# Patient Record
Sex: Male | Born: 1955
Health system: Southern US, Community
[De-identification: ages and names within clinical notes are randomized; demographics above are authoritative.]

## PROBLEM LIST (undated history)

## (undated) DIAGNOSIS — Z972 Presence of dental prosthetic device (complete) (partial): Secondary | ICD-10-CM

## (undated) DIAGNOSIS — Z8719 Personal history of other diseases of the digestive system: Secondary | ICD-10-CM

## (undated) DIAGNOSIS — F32A Depression, unspecified: Secondary | ICD-10-CM

## (undated) DIAGNOSIS — Z8711 Personal history of peptic ulcer disease: Secondary | ICD-10-CM

## (undated) DIAGNOSIS — K219 Gastro-esophageal reflux disease without esophagitis: Secondary | ICD-10-CM

## (undated) DIAGNOSIS — T148XXA Other injury of unspecified body region, initial encounter: Secondary | ICD-10-CM

## (undated) DIAGNOSIS — F329 Major depressive disorder, single episode, unspecified: Secondary | ICD-10-CM

## (undated) DIAGNOSIS — M1990 Unspecified osteoarthritis, unspecified site: Secondary | ICD-10-CM

## (undated) DIAGNOSIS — Z973 Presence of spectacles and contact lenses: Secondary | ICD-10-CM

## (undated) DIAGNOSIS — Z969 Presence of functional implant, unspecified: Secondary | ICD-10-CM

## (undated) DIAGNOSIS — F419 Anxiety disorder, unspecified: Secondary | ICD-10-CM

## (undated) DIAGNOSIS — R51 Headache: Secondary | ICD-10-CM

## (undated) DIAGNOSIS — Z87442 Personal history of urinary calculi: Secondary | ICD-10-CM

## (undated) DIAGNOSIS — K760 Fatty (change of) liver, not elsewhere classified: Secondary | ICD-10-CM

## (undated) HISTORY — PX: INGUINAL HERNIA REPAIR: SUR1180

## (undated) HISTORY — PX: ESOPHAGOGASTRODUODENOSCOPY: SHX1529

---

## 2001-04-16 ENCOUNTER — Emergency Department (HOSPITAL_COMMUNITY): Admission: EM | Admit: 2001-04-16 | Discharge: 2001-04-16 | Payer: Self-pay | Admitting: Emergency Medicine

## 2001-04-16 ENCOUNTER — Encounter: Payer: Self-pay | Admitting: Emergency Medicine

## 2001-05-16 ENCOUNTER — Encounter: Payer: Self-pay | Admitting: Urology

## 2001-05-16 ENCOUNTER — Ambulatory Visit (HOSPITAL_COMMUNITY): Admission: RE | Admit: 2001-05-16 | Discharge: 2001-05-16 | Payer: Self-pay | Admitting: Urology

## 2001-06-03 ENCOUNTER — Encounter: Payer: Self-pay | Admitting: Urology

## 2001-06-03 ENCOUNTER — Ambulatory Visit (HOSPITAL_COMMUNITY): Admission: RE | Admit: 2001-06-03 | Discharge: 2001-06-03 | Payer: Self-pay | Admitting: Urology

## 2001-08-13 ENCOUNTER — Inpatient Hospital Stay (HOSPITAL_COMMUNITY): Admission: EM | Admit: 2001-08-13 | Discharge: 2001-08-15 | Payer: Self-pay | Admitting: *Deleted

## 2001-08-13 ENCOUNTER — Encounter: Payer: Self-pay | Admitting: *Deleted

## 2001-08-14 ENCOUNTER — Encounter: Payer: Self-pay | Admitting: Urology

## 2001-08-22 ENCOUNTER — Emergency Department (HOSPITAL_COMMUNITY): Admission: EM | Admit: 2001-08-22 | Discharge: 2001-08-22 | Payer: Self-pay | Admitting: *Deleted

## 2001-08-22 ENCOUNTER — Encounter: Payer: Self-pay | Admitting: *Deleted

## 2002-06-12 ENCOUNTER — Encounter (INDEPENDENT_AMBULATORY_CARE_PROVIDER_SITE_OTHER): Payer: Self-pay | Admitting: Internal Medicine

## 2002-06-12 ENCOUNTER — Ambulatory Visit (HOSPITAL_COMMUNITY): Admission: RE | Admit: 2002-06-12 | Discharge: 2002-06-12 | Payer: Self-pay | Admitting: Internal Medicine

## 2002-06-22 ENCOUNTER — Encounter (INDEPENDENT_AMBULATORY_CARE_PROVIDER_SITE_OTHER): Payer: Self-pay | Admitting: Internal Medicine

## 2002-06-22 ENCOUNTER — Ambulatory Visit (HOSPITAL_COMMUNITY): Admission: RE | Admit: 2002-06-22 | Discharge: 2002-06-22 | Payer: Self-pay | Admitting: Internal Medicine

## 2002-08-07 ENCOUNTER — Ambulatory Visit (HOSPITAL_COMMUNITY): Admission: RE | Admit: 2002-08-07 | Discharge: 2002-08-07 | Payer: Self-pay | Admitting: Internal Medicine

## 2002-10-08 HISTORY — PX: BACK SURGERY: SHX140

## 2003-03-05 ENCOUNTER — Inpatient Hospital Stay (HOSPITAL_COMMUNITY): Admission: RE | Admit: 2003-03-05 | Discharge: 2003-03-09 | Payer: Self-pay | Admitting: Neurosurgery

## 2003-03-05 ENCOUNTER — Encounter: Payer: Self-pay | Admitting: Neurosurgery

## 2004-09-15 ENCOUNTER — Emergency Department (HOSPITAL_COMMUNITY): Admission: EM | Admit: 2004-09-15 | Discharge: 2004-09-15 | Payer: Self-pay | Admitting: Emergency Medicine

## 2005-12-07 ENCOUNTER — Encounter (HOSPITAL_COMMUNITY): Admission: RE | Admit: 2005-12-07 | Discharge: 2006-01-06 | Payer: Self-pay | Admitting: Preventative Medicine

## 2006-10-08 HISTORY — PX: CERVICAL SPINE SURGERY: SHX589

## 2007-03-28 ENCOUNTER — Encounter: Admission: RE | Admit: 2007-03-28 | Discharge: 2007-03-28 | Payer: Self-pay | Admitting: Neurosurgery

## 2007-06-03 ENCOUNTER — Ambulatory Visit (HOSPITAL_COMMUNITY): Admission: RE | Admit: 2007-06-03 | Discharge: 2007-06-04 | Payer: Self-pay | Admitting: Neurosurgery

## 2009-09-28 ENCOUNTER — Encounter: Admission: RE | Admit: 2009-09-28 | Discharge: 2009-09-28 | Payer: Self-pay | Admitting: Neurosurgery

## 2011-02-20 NOTE — Op Note (Signed)
NAME:  Bradley, Espinoza NO.:  [id number]   MEDICAL RECORD NO.:  [id number]          PATIENT TYPE:  AMB   LOCATION:  SDS                          FACILITY:  [facility]   PHYSICIAN:  Hilda Lias, M.D.   DATE OF BIRTH:  1955/12/31   DATE OF PROCEDURE:  06/03/2007  DATE OF DISCHARGE:                               OPERATIVE REPORT   PREOPERATIVE DIAGNOSIS:  C5-C6 herniated disk central to the left with  displacement of spinal cord, chronic neck pain.   POSTOPERATIVE DIAGNOSES:  C5-C6 herniated disk central to the left with  displacement of spinal cord, chronic neck pain.   PROCEDURE:  C5-C6 diskectomy anteriorly, decompression of the spinal  cord, bilateral foraminotomy, interbody fusion with allograft plate,  microscope.   SURGEON:  Hilda Lias, M.D.   ASSISTANT:  Cristi Loron, M.D.   CLINICAL HISTORY:  The patient has been followed by me for more than a  year complaining of neck pain which is getting worse lately.  Myelogram  showed that he has a large herniated disk at the level of C5-6, C6-7  central to the left.  Surgery was advised.  The risks were explained in  history and physical.   PROCEDURE:  The patient was taken to the OR and after intubation, the  neck was cleaned with DuraPrep.  Transverse incision was made through  the skin, platysma down to the cervical spine.  X-rays showed that we  were at level 5-6.  From then on we opened anterior ligament with the  help of the microscope we did a total gross diskectomy.  Then the  posterior ligament was opened and indeed there was a large herniated  disk centrally and to the left.  Using 1 and 2 mm Kerrison punch,  removal of the posterior ligament as well as removal of the fragment was  achieved.  Foraminotomy to decompress the C6 nerve root was done  bilaterally and at the end we had good decompression not only of the  nerve root but also the spinal cord.  From then on the endplate was  drilled and  9-mm allograft which was trimmed down to eight, lordotic was  inserted followed by a plate using four screws.  The cervical spine  showed good position of bone graft and the plate.  From then on the area  was irrigated.  Hemostasis was accomplished.  Once we proved there was a  hemostasis the wound was closed with Vicryl and Steri-Strips.           ______________________________  Hilda Lias, M.D.     EB/MEDQ  D:  06/03/2007  T:  06/04/2007  Job:  [id number]

## 2011-02-20 NOTE — H&P (Signed)
NAME:  Bradley Espinoza, Bradley Espinoza NO.:  [id number]   MEDICAL RECORD NO.:  [id number]          PATIENT TYPE:  OIB   LOCATION:  3035                         FACILITY:  [facility]   PHYSICIAN:  Hilda Lias, M.D.   DATE OF BIRTH:  1956-07-03   DATE OF ADMISSION:  06/03/2007  DATE OF DISCHARGE:                              HISTORY & PHYSICAL   HISTORY AND PHYSICAL:  Bradley Espinoza is a gentleman who has been seen by  me on several occasions with a history of 2 years worth of neck pain  which is radiating to the upper extremity, is associated with  discomfort.  When I saw him in June of 2007, he has some degenerative  changes in L5-6, 6-7, but the neurological examination was normal.  In  the interim, he came back complaining of weakness of the biceps,  weakness of the wrist, and the pain was getting worse.  The myelogram  showed that indeed now he has a herniated disk at the L5-6 with  displacement of the spinal cord.  Because of this, he is being admitted  for surgery.   PAST MEDICAL HISTORY:  Lumbar fusion at level 5-1.   ALLERGIES:  HE IS ALLERGIC TO CODEINE.   SOCIAL HISTORY:  He is smoking.  He smokes a pack a day.   FAMILY HISTORY:  Positive for diabetes.   REVIEW OF SYSTEMS:  His review of systems  is positive for anxiety and  depression.   PHYSICAL EXAMINATION:  HEAD/EARS/NOSE/THROAT:  Normal.  NECK:  He is able to flex and extend __________ lateral __________  discomfort and pain going to his shoulders.  LUNGS:  Clear.  CARDIOVASCULAR:  Heart sounds are normal.  LOWER EXTREMITIES:  __________.  NEURO:  Shows weakness of the __________ wrist extensor with a normal  deltoid.  Pulses symmetric.  Sensation normal.   The cervical x-ray as well as the myelogram showed that he has a  herniated disk at L5-6 with displacement of the spinal cord.   CLINICAL IMPRESSION:  L5-6 herniated disk with displacement of the  spinal cord.   RECOMMENDATIONS:  The patient is being  admitted for surgery.  We will  proceed with anterior cervical diskectomy of L5-6 with allograft and  plate.  He knows of the risks such as infection, cerebral spinal fluid  leak, hematoma, paralysis, stroke, need for further surgery secondary to  failure of the hardware.  Also, the risk of proceeding with smoking, and  the allograft.           ______________________________  Hilda Lias, M.D.     EB/MEDQ  D:  06/03/2007  T:  06/03/2007  Job:  [id number]

## 2011-02-23 NOTE — Op Note (Signed)
NAME:  Bradley Espinoza, Bradley Espinoza                     ACCOUNT NO.:  [id number]   MEDICAL RECORD NO.:  [id number]                   PATIENT TYPE:  AMB   LOCATION:  DAY                                  FACILITY:  [facility]   PHYSICIAN:  Lionel December, M.D.                 DATE OF BIRTH:  May 17, 1956   DATE OF PROCEDURE:  08/07/2002  DATE OF DISCHARGE:                                 OPERATIVE REPORT   PROCEDURE:  Total colonoscopy with terminal ileoscopy.   INDICATIONS:  The patient is a 55 year old Caucasian male with a history of  abdominal pain and bloating, who has been diagnosed with irritable bowel  syndrome.  He has had fairly extensive evaluation in the past.  He was seen  in the office recently complaining of rectal bleeding, rectal irritation, as  well as mucous discharge per rectum.  The procedure risks were reviewed with  the patient, and informed consent was obtained.   PREMEDICATION:  Demerol 50 mg IV, Versed 8 mg IV in divided dose.   INSTRUMENT USED:  Olympus video system.   FINDINGS:  Procedure performed in endoscopy suite.  The patient's vital  signs and O2 saturation were monitored during procedure and remained stable.  The patient was placed in the left lateral recumbent position and rectal  examination performed.  This was within normal limits.  The scope was placed  in the rectum and advanced to the region of the sigmoid colon and beyond.  Preparation was excellent.  The scope was passed to the cecum, which was  identified by the ileocecal valve and appendiceal orifice.  The scope was  passed in the TI.  At least 10-15 cm was examined and was normal.  As the  scope was withdrawn, colonic mucosa was carefully examined.  There was a  small polyp at the distal transverse colon, which was ablated by cold  biopsy.  The mucosa of the rest of the colon and rectum was normal.  The  scope was retroflexed, examining the anorectal junction.  Hemorrhoids below  the dentate line.  The  endoscope was straightened and withdrawn.  The  patient tolerated the procedure well.   FINAL DIAGNOSES:  1. Examination of the colon to the cecum along with inspection of terminal     ileum.  2. No evidence of inflammatory bowel disease.  3. Small polyp ablated by cold biopsy from distal transverse colon.  4. External hemorrhoids.   RECOMMENDATIONS:  He will continue Fiber Choice two tablets daily along with  other medications and return for follow-up visit in eight weeks from now.                                               Lionel December, M.D.    NR/MEDQ  D:  08/07/2002  T:  08/07/2002  Job:  [id number]

## 2011-02-23 NOTE — Discharge Summary (Signed)
   NAME:  Bradley Espinoza, Bradley Espinoza NO.:  [id number]   MEDICAL RECORD NO.:  [id number]                   PATIENT TYPE:  INP   LOCATION:  3017                                 FACILITY:  [facility]   PHYSICIAN:  Hilda Lias, M.D.                DATE OF BIRTH:  1956/04/17   DATE OF ADMISSION:  03/05/2003  DATE OF DISCHARGE:  03/09/2003                                 DISCHARGE SUMMARY   ADMISSION DIAGNOSIS:  L5-S1 spondylolisthesis with a herniated disk.   FINAL DIAGNOSIS:  L5-S1 spondylolisthesis with a herniated disk.   HISTORY OF PRESENT ILLNESS:  The patient was admitted because of back pain  with radiation into both legs. X-rays showed that he has a severe case of  spondylolisthesis. The patient, because of the continuation of the pain to  both legs, he decided to undergo surgery.   LABORATORY DATA:  Normal.   HOSPITAL COURSE:  The patient was taken to surgery and had L5-S1 diskectomy  with fusion between L5 and S1 was done. Today, the patient is doing great.  He is ambulating. He has minimal discomfort and is ready to go home.   CONDITION ON DISCHARGE:  Improving.   MEDICATIONS:  Percocet and Diazepam.   DIET:  Regular.   ACTIVITY:  Not to drive and not to do any heavy lifting until he sees me in  six weeks.   FOLLOWUP:  To be seen by me in six weeks.                                               Hilda Lias, M.D.    EB/MEDQ  D:  03/09/2003  T:  03/09/2003  Job:  [id number]

## 2011-02-23 NOTE — H&P (Signed)
   NAME:  Bradley Espinoza, Bradley Espinoza NO.:  [id number]   MEDICAL RECORD NO.:  [id number]                   PATIENT TYPE:  INP   LOCATION:  2899                                 FACILITY:  [facility]   PHYSICIAN:  Hilda Lias, M.D.                DATE OF BIRTH:  06-22-1956   DATE OF ADMISSION:  03/05/2003  DATE OF DISCHARGE:                                HISTORY & PHYSICAL   HISTORY OF PRESENT ILLNESS:  The patient is a gentleman who was seen in my  office on several occasions because of chronic back pain with radiation down  to both legs which, according to him, is getting worse.  The pain sometimes  gets worse when he is sitting and tries to stand up.  Walking makes the pain  worse.  He had difficulty at work and he had problems with climbing and  lifting.  Four years ago, he had an MRI.  He was advised surgery but he  declined.  Now he feels that he is worse and he wants to proceed with  surgery.   PAST MEDICAL HISTORY:  This is negative.   ALLERGIES:  CODEINE and ASPIRIN.   HABITS:  He does not drink.  He smokes.   FAMILY HISTORY:  Unremarkable.   REVIEW OF SYSTEMS:  This is positive for kidney stones, back pain,  arthritis, and neck pain.   PHYSICAL EXAMINATION:  GENERAL APPEARANCE:  The patient came to my office  and had difficulty standing and sitting.  HEENT:  Normal.  NECK:  Normal.  LUNGS:  Clear.  CARDIOVASCULAR:  Heart sounds normal.  ABDOMEN:  Normal.  EXTREMITIES:  Normal pulses.  NEUROLOGIC:  Mental status normal.  Cranial nerves normal.  Strength 5/5.  He has some weakness in dorsiflexion and plantar flexion in both feet.  Straight leg raising is positive bilaterally about 45 degrees.  He has a  decrease of flexibility of lumbar spine.   The MRI as well as the lumbar spine x-ray showed that he has a degenerative  disc disease at the level of 5-1 associated with a herniated disc  bilaterally and spondylolisthesis.   IMPRESSION:  L5-S1  spondylolisthesis with herniated disc, chronic back pain  and bilateral radiculopathy.   RECOMMENDATIONS:  The patient is being admitted.  The procedure will be L5-  S1 discectomy, interval effusion, pedicle screw and posterolateral fusion.  The patient knows about the risks such as infection, CSF leak, worsening of  the pain, paralysis, no improvement whatsoever, need for another surgery.                                              Hilda Lias, M.D.   EB/MEDQ  D:  03/05/2003  T:  03/05/2003  Job:  [id number]

## 2011-02-23 NOTE — Op Note (Signed)
NAME:  Bradley Espinoza, Bradley Espinoza                     ACCOUNT NO.:  [id number]   MEDICAL RECORD NO.:  [id number]                   PATIENT TYPE:  AMB   LOCATION:  DAY                                  FACILITY:  [facility]   PHYSICIAN:  Lionel December, M.D.                 DATE OF BIRTH:  05/24/1956   DATE OF PROCEDURE:  06/22/2002  DATE OF DISCHARGE:                                 OPERATIVE REPORT   PROCEDURE:  Esophagogastroduodenoscopy.   ENDOSCOPIST:  Lionel December, M.D.   INDICATIONS:  This patient is a 55 year old Caucasian male with recurrent  abdominal pain who has extreme bloating as well as symptoms of GERD.  He is  felt to have IBS and GERD but has not responded to therapy.  Workup for  gallbladder disease in the past was negative.  He was seen in the office on  06/04/02 and had a celiac antibody panel which was negative.  This was  followed by an ultrasound which was negative for cholelithiasis.  It showed  mild diffuse fatty infiltration of the liver.  He also had upper GI with  small-bowel follow through.  This study was normal except it suggested a  small duodenal ulcer and adjacent duodenitis.  The radiologist dictated that  scar tissue from previous ulcer disease is less likely.  It is interesting  to note that upper GI series about 3 years ago revealed similar findings.  His H. pylori serology, at that time, was negative.  He is undergoing  diagnostic EGD.  The procedure and risks were reviewed with the patient and  informed consent was obtained.   PREOPERATIVE MEDICATIONS:  Cetacaine spray for pharyngeal topical  anesthesia, Demerol 50 mg IV and Versed 6 mg IV in divided dose.   INSTRUMENT:  Olympus video system.   FINDINGS:  Procedure performed in endoscopy suite.  The patient's vital  signs and O2 saturation were monitored during the procedure and remained  stable.  The patient was placed in the left lateral recumbent position and  endoscope was passed via the  oropharynx without any difficulty into the  esophagus.   ESOPHAGUS:  Mucosa of the esophagus was normal.  GE junction was wavy.  No  hernia was identified.   STOMACH:  It was empty and distended very well with insufflation.  The folds  in the proximal stomach were normal.  Examination of the mucosa revealed a  few prepyloric erosions, but there was no ulcer.  Pyloric channel, was  patent. Angularis and fundus were examined by retroflexing the scope and  were normal.   DUODENUM:  Examination of the bulb revealed focal mucosal edema, but there  were no erosions or ulcers noted.  The scope was passed to the second part  of the duodenum where the mucosa and folds were normal.   The endoscope was withdrawn.  The patient tolerated the procedure well.   FINAL DIAGNOSES:  1. Erosive antral  gastritis and bulbar duodenitis.  2. No evidence of peptic ulcer disease.   RECOMMENDATIONS:  1. H. pylori serology will be repeated today.  2. CBC with Chem-20.  Will schedule him for abdominal-pelvic CT.  3. We will start him on Celexa 20 mg every day.  4. If all of these studies are normal and he does not feel better will     consider evaluation at [facility].                                               Lionel December, M.D.    NR/MEDQ  D:  06/22/2002  T:  06/22/2002  Job:  [id number]

## 2011-02-23 NOTE — H&P (Signed)
[facility]  Patient:    SUSANO, CLECKLER Visit Number: [id number] MRN: [id number]          Service Type: MED Location: 3A A308 01 Attending Physician:  Rosalyn Charters Dictated by:   Dennie Maizes, M.D. Admit Date:  08/13/2001                           History and Physical  CHIEF COMPLAINT:  Severe right flank pain radiating to the front, nausea and vomiting.  HISTORY OF PRESENT ILLNESS:  This 55 year old male has a history of ______. He had renal colic and past left ureteral calculus four months ago. He has been having intermittent severe right flank pain radiating to the front for the past two weeks. He had recurrent severe pain today associated with nausea and vomiting. The patient came to the emergency room for further evaluation. He also had mild hematuria. There was no history of fever or chills. Urinalysis in the emergency room revealed microhematuria. Evaluation was done with a non-contrast CT of the abdomen and pelvis. This revealed mild right hydronephrosis and obstructing 5 mm sized right upper ureteral calculus. As the patients pain is not adequately controlled, he was admitted to the hospital for IV fluids, pain control and further evaluation and treatment.  PAST MEDICAL HISTORY:  History of ______. He passed a stone in July 2002. History of peptic ulcer disease and ______ status post right inguinal hernia repair x 2, status post left inguinal hernia repair x 2, status post bilateral vasectomy.  MEDICATIONS:  Zantac 200 mg one p.o. q.h.s.  ALLERGIES:  CODEINE.  FAMILY HISTORY:  Positive for diabetes and carcinoma.  PHYSICAL EXAMINATION:  GENERAL:  The patient is in severe pain.  HEENT:  Normal.  NECK:  No masses.  LUNGS:  Clear to auscultation.  HEART:  Regular rate and rhythm, no murmurs.  ABDOMEN:  Soft, no palpable frank mass. Moderate costovertebral angle tenderness is noted.  GENITALIA: Penis and testes are  normal.  LABORATORY DATA:  BUN 16, creatinine 0.9. CBC, wbc 8.3, hemoglobin 14.3, hematocrit 39.6. Urinalysis blood ______. CT scan revealed a 5 mm size right upper ureteral calculus with mild hydronephrosis. A 5-10 mm size left adrenal adenoma has been noted.  IMPRESSION:  Right renal colic, right upper ureteral calculus with obstruction, right hydronephrosis.  PLAN:  1. Will admit the patient to the hospital for IV fluids.  2. Analgesia with PCA pump.  3. Strain all urine for stones.  4. Check ______ in the a.m.  5. Discussed with the patient and his family regarding the management     options. He wants to proceed with endoscopy ______ stone. The patient is     scheduled to undergo cystoscopy, right retrograde pyelogram,     possible right ureteroscopy, stone extraction or possible right     ureteral stone placement under anesthesia in the a.m. I explained to     him about diagnosis, operative details, alternate treatments, outcome,     possible risks and complications and he has agreed for the procedure     to be done. Dictated by:   Dennie Maizes, M.D. Attending Physician:  Rosalyn Charters DD:  08/13/01 TD:  08/13/01 Job: [id number]

## 2011-02-23 NOTE — H&P (Signed)
NAME:  Bradley Espinoza, Bradley Espinoza NO.:  [id number]   MEDICAL RECORD NO.:  [id number]                  PATIENT TYPE:   LOCATION:                                       FACILITY:   PHYSICIAN:  Lionel December, M.D.                 DATE OF BIRTH:   DATE OF ADMISSION:  DATE OF DISCHARGE:                                HISTORY & PHYSICAL   CHIEF COMPLAINT:  Follow-up of upper endoscopy, abdominal bloating,  abdominal pain.   HISTORY OF PRESENT ILLNESS:  The patient is a pleasant 55 year old gentleman  who has a history of abdominal bloating and abdominal pain previously felt  to be secondary to IBS.  He also has typical gastroesophageal reflux  disease.  He was seen in the office regarding these complaints as well as  _________ type rectal bleeding and anal leakage.  This visit was on April 22, 2002.  Since then he has had an upper abdominal ultrasound which revealed  mild diffuse fatty infiltration of the liver.  Upper GI series with small-  bowel follow-through revealed a small duodenal ulcer and adjacent  duodenitis.  It was dictated scar tissue from previous ulcer disease less  likely.  He had a negative celiac disease antibody profile.  He also had a  CT of the abdomen and pelvis which revealed small left adrenal adenoma, tiny  right inguinal hernia containing only fat.  EGD revealed erosive antral  gastritis and bulbar duodenitis though no evidence of peptic ulcer disease.  H. pylori serologies were repeated and revealed 1.01 which were considered  equivocal.   The patient states today stating that he continues to have abdominal  bloating and pain.  Symptoms may be slightly improved.  The pain is crampy-  like and usually in the lower abdomen.  Symptoms are generally worse after  eating.  Therefore, he tends to eat very little.  He denies any heartburn  symptoms as long as he takes the Aciphex.  Denies any nausea or vomiting.  His weight is up by nine pounds  since we last saw him.  He is having daily  bowel movements but tells me at least one week out of the month he has a lot  of rectal irritation, burning, rectal bleeding.  He has been using Analpram  2.5% cream which seems to help.  He denies any dysphagia or odynophagia.   CURRENT MEDICATIONS:  Please see updated list on July 22, 2002.   ALLERGIES:  CODEINE and ASPIRIN.   PAST MEDICAL HISTORY:  1. Migraine headaches.  2. Anxiety.  3. Gastroesophageal reflux disease.  4. IBS.   PAST SURGICAL HISTORY:  1. Kidney stones.  2. Bilateral inguinal hernia repair in the early 1980s, twice on the left     side.  3. In September 1999, he had a vasectomy.   FAMILY HISTORY:  He has five sisters and two brothers, some of whom have  stomach and nerve problems.  No family history of colorectal cancer.   SOCIAL HISTORY:  He is married for three years.  He has three children from  a previous marriage.  He was recently laid off.  He no longer smokes but has  a history of smoking of less than one pack of cigarettes daily for  approximately 20 years.  Denies any alcohol use.   REVIEW OF SYSTEMS:  Please see HPI for GI and GENERAL.   PHYSICAL EXAMINATION:  VITAL SIGNS:  Weight 195, up from 186.  Blood  pressure 100/70, pulse 62.  GENERAL:  Very pleasant, well-nourished, well-developed Caucasian male in no  acute distress.  SKIN:  Warm and dry.  No jaundice.  HEENT:  Conjunctivae are pink.  Sclerae are nonicteric.  Oropharyngeal  mucosa moist and pink.  No lesions, erythema, or exudate.  No  lymphadenopathy or thyromegaly.  CHEST:  Lungs clear to auscultation.  CARDIAC:  Reveals regular rate and rhythm.  Normal S1, S2.  No murmurs,  rubs, or gallops.  ABDOMEN:  Positive bowel sounds.  Soft, nondistended.  He has mild  tenderness noted in the lower abdomen.  No rebound tenderness or guarding.  No organomegaly or masses.  RECTAL:  Done back in July 2003, therefore, not repeated today.   EXTREMITIES:  No edema.   IMPRESSION:  The patient continues to have:  1. Abdominal bloating associated with lower abdominal cramps:  He was     previously felt to have irritable bowel syndrome.  Extensive workup as     outlined in HPI has thus far failed to explain the symptoms.  2. Gastroesophageal reflux disease:  Symptoms well controlled on Aciphex     daily.  3. Erosive antral gastritis, bulbar duodenitis seen on recent EGD:     Helicobacter pylori serologies were equivocal.  4. Chronic recurrent hematochezia associated with rectal irritation and     burning:  Since this is chronic in nature and he is 55 years old, I have     recommended colonoscopy at this time.  I discussed risks, alternatives,     and benefits, and he is agreeable to proceed.  5. Fatty liver based on ultrasound.   PLAN:  1. Will recheck H. pylori serologies.  2. LFTs.  3. Colonoscopy in the near future.  4. Analpram 2.5% cream to apply t.i.d. for 10 days as needed for rectal     irritation and pain.  5. Aciphex 20 mg daily.  6. Continue Fiber Choice 2 tablets daily, Colace 2 tablets daily.  7. Simethicone 80 mg q.i.d. p.r.n. gas, #60 with 0 refills given.       Tana Coast, P.A.                        Lionel December, M.D.    LL/MEDQ  D:  07/22/2002  T:  07/22/2002  Job:  [id number]

## 2011-02-23 NOTE — Op Note (Signed)
[facility]  Patient:    KARLIN, HEILMAN Visit Number: [id number] MRN: [id number]          Service Type: MED Location: 3A A308 01 Attending Physician:  Rosalyn Charters Dictated by:   Dennie Maizes, M.D. Proc. Date: 08/14/01 Admit Date:  08/13/2001                             Operative Report  PREOPERATIVE DIAGNOSIS:  1. Right renal colic. 2. Right upper ureteral calculus with obstruction.  3. Right hydronephrosis.  POSTOPERATIVE DIAGNOSIS:  1. Right renal colic. 2. Right upper ureteral calculus with obstruction.  3. Right hydronephrosis.  OPERATION/PROCEDURE:  Cystoscopy, right retrograde pyelogram and right ureteral stent placement.  SURGEON: Dennie Maizes, M.D.  ANESTHESIA:  General  COMPLICATIONS:  None.  DRAINS:  26 cm size right ureteral stent.  INDICATIONS:  This 55 year old male is admitted to the hospital with right renal colic.  Evaluation revealed a 5 mm size right upper ureteral calculus with obstruction and mild hydronephrosis.  The patient was unable to pass the stone and he had severe pain.  He was taken to the OR today for cystoscopy and right retrograde pyelogram and right ureteral stent placement.  DESCRIPTION OF PROCEDURE:  General anesthesia was induced and the patient was placed on the OR table in the dorsolithotomy position.  The lower abdomen and genitalia were prepped and draped in a sterile fashion.  Cystoscopy was done with the 25 [place] scope.  The urethra and prostate were normal.  The appearance of the bladder was unremarkable.  There was no abnormality inside the bladder.  A 5 French wedge catheter was then placed on the right ureteral orifice. About 7 cc of Renografin 60 was injected into the collecting system and a retrograde pyelogram was done with C-arm fluoroscopy.  The distal ureter was normal.  There was a filling defect in the upper ureter at the level of the L2 transverse process about 5-6 mm in  size.  There was mild proximal hydronephrosis and hydroureter.  A 6 French open-ended catheter was then placed in the right ureteral orifice. A 0.038-inch flexible guidewire was then inserted into the right renal pelvis. The open-ended catheter was removed.  A 6 French 26-cm sized stent was then inserted above the guidewire to the right collecting system.  The cystoscope was removed.  The patient was transferred to the PACU in satisfactory condition. Dictated by:   Dennie Maizes, M.D. Attending Physician:  Rosalyn Charters DD:  08/14/01 TD:  08/16/01 Job: [id number]

## 2011-02-23 NOTE — Discharge Summary (Signed)
[facility]  Patient:    Bradley Espinoza, Bradley Espinoza Visit Number: [id number] MRN: [id number]          Service Type: EMS Location: ED Attending Physician:  Rosalyn Charters Dictated by:   Dennie Maizes, M.D. Admit Date:  08/22/2001 Discharge Date: 08/22/2001                             Discharge Summary  FINAL DIAGNOSES: 1. Right upper ureteral calculus with obstruction. 2. Right hydronephrosis. 3. Right renal colic.  OPERATIVE PROCEDURE:  Cystoscopy, right ureteral pyelogram and right ureteral stent placement.  COMPLICATIONS:  None.  DISCHARGE SUMMARY:  This 55 year old male has a history of recurrent urolithiasis.  He had renal colic and passed a left ureteral calculus 4 months ago.  He has been having intermittent severe right flank pain radiating to the front for about 2 weeks.  He had recurrent severe pain on August 13, 2001, associated with nausea and vomiting.  He came to the emergency room for further evaluation.  He also had mild hematuria.  He denies history of fever or chills.  Urinalysis in the emergency room revealed microhematuria.  A non-contrast CT of the abdomen and pelvis revealed mild right hydronephrosis and obstructing 5 mm sized right upper ureteral calculus.   As the patients pain was not adequately controlled with p.o. analgesics, and parenteral narcotics in the emergency room he was admitted to the hospital for IV fluids, pain control and further evaluation and treatment.  PAST MEDICAL HISTORY: 1. History of ureterolithiasis.  He passed a stone in July 2002. 2. History of peptic ulcer disease. 3. Status post right inguinal hernia repair x2. 4. Status post left inguinal hernia repair x2. 5. Status post bilateral vasectomy.  MEDICATIONS:  Zantac 200 mg 1 p.o. q.h.s.  ALLERGIES:  CODEINE.  FAMILY HISTORY:  Positive for diabetes and carcinoma.  PHYSICAL EXAMINATION:  GENERAL:  Revealed the patient to be in severe  pain.  HEENT: Normal.  NECK:  No masses.  LUNGS:  Clear to auscultation.  HEART: Regular rate and rhythm, no murmurs.  ABDOMEN:  Soft, no palpable flank mass.  Moderate costovertebral angle tenderness was noted.  PENIS AND TESTES:  Normal.  ADMISSION LABORATORY DATA:  BUN 16, creatinine 0.9.  CBC:  WBC 8.3, hemoglobin 14.3, hematocrit 39.6.  The abdominal CT revealed a 5 mm sized right upper ureteral calculus with mild hydronephrosis.  A 5 to 10 mm sized left adrenal adenoma was also noted.  The patient continued to have severe pain.  He was taken to the operating room on August 14, 2001, and underwent cystoscopy, right retrograde pyelogram and right ureteral stent placement.  In the postoperative period the patient continued to have intermittent right flank pain as well as suprapubic pain and dysuria.  He was maintained on patient-controlled analgesia.  On August 15, 2001, he was doing well and voiding without any difficulty.  He had good relief of his pain with p.o. medications.  He was discharged and sent home on August 15, 2001.  DISCHARGE MEDICATIONS: 1. Tylox 1 p.o. q.8h. p.r.n. pain #20. 2. Cipro 200 mg 1 p.o. b.i.d. for 7 days.  The patient will be reviewed in the office on August 18, 2001.  ESL of the right upper ureteral calculus will be scheduled as an outpatient.  The patient was advised to call me for any fever, chills, voiding difficulty, gross hematuria, or severe flank pain. Dictated by:   Garfield Cornea  Rito Ehrlich, M.D. Attending Physician:  Rosalyn Charters DD:  10/13/01 TD:  10/13/01 Job: [id number]

## 2011-02-23 NOTE — Op Note (Signed)
NAME:  Bradley Espinoza, Bradley Espinoza NO.:  [id number]   MEDICAL RECORD NO.:  [id number]                   PATIENT TYPE:  INP   LOCATION:  2899                                 FACILITY:  [facility]   PHYSICIAN:  Hilda Lias, M.D.                DATE OF BIRTH:  [birth year]   DATE OF PROCEDURE:  03/05/2003  DATE OF DISCHARGE:                                 OPERATIVE REPORT   PREOPERATIVE DIAGNOSES:  L5-S1 spondylolisthesis with a herniated disk,  chronic back pain, chronic radiculopathy.   POSTOPERATIVE DIAGNOSES:  L5-S1 spondylolisthesis with a herniated disk,  chronic back pain, chronic radiculopathy.   PROCEDURES:  1. L5 Gill procedure.  2. Total laminectomy.  3. Total L5-S1 diskectomy.  4. Interbody fusion with pedicle screws, L5 to S1.  5. Posterolateral arthrodesis, L5 to S1.  6. Decompression of the L5 and S1 nerve root.  7. C-arm.  8. Cell Saver.   SURGEON:  Hilda Lias, M.D.   ASSISTANT:  Danae Orleans. Venetia Maxon, M.D.   CLINICAL HISTORY:  The patient was admitted because of back pain with  radiation down to both legs for many years.  X-rays showed that he has a  spondylolisthesis between L5 and S1, which moves with flexion and extension.  He also had an MRI that showed a herniated disk at that level.  The patient  wanted to go ahead with surgery because the pain was getting worse.  In the  past he had been advised to have surgery, but he decided to wait and now he  is to the point where he cannot live longer with this pain.   DESCRIPTION OF PROCEDURE:  The patient was taken to the OR, and he was  positioned in a prone manner.  The back was prepped with Betadine.  A  midline incision from L4-5 down to S1 was made.  Muscle was retracted all  the way laterally until we found the transverse process of L5.  With a towel  clip we were able to see that indeed the L5 lamina and spinous process was  completely loose.  We proceeded with a Gill procedure, doing a  total  laminectomy.  The facet of L5 also was removed.  Indeed, we found quite a  bit of scar tissue compromising mostly the L5 nerve root, the left worse  than the right one.  Lysis was accomplished, and we were able to clean not  only the L5 but both S1 nerve roots.  We found the disk space.  We entered  the disk space bilaterally and total gross diskectomy was accomplished.  Then with the curette we removed the end plates bilaterally.  We were able  to introduce two pieces of bone graft, allograft, 10 x 24.  Medially and  laterally we used a mixture of Vitoss as well as autograft.  We filled up  the rest of the disk space with this material.  From then on with the C-arm  we found the pedicle of L5-S1.  We drilled, and we were able to introduce  four screws of 6.5 x 40.  AP and lateral views showed good position of the  pedicle screws.  From then on, both _______ bone plug, and the area was  packed in place.  We had plenty of space for the L5 and S1 nerve roots  bilaterally.  From then on we found the ala of the sacrum as well  as the transverse process of L5.  Periosteum was removed.  A mixture of  allograft and autograft was used to fill up that space.  Valsalva maneuver  was negative.  Nevertheless, we used Tisseel at the level of the L5 nerve  root.  From then on the area was irrigated.  The wound was closed with  Vicryl and a Steri-Strip.                                               Hilda Lias, M.D.    EB/MEDQ  D:  03/05/2003  T:  03/06/2003  Job:  [id number]

## 2011-06-27 ENCOUNTER — Other Ambulatory Visit: Payer: Self-pay | Admitting: Anesthesiology

## 2011-06-27 DIAGNOSIS — M545 Low back pain: Secondary | ICD-10-CM

## 2011-06-30 ENCOUNTER — Other Ambulatory Visit: Payer: Self-pay

## 2011-07-02 ENCOUNTER — Other Ambulatory Visit (HOSPITAL_COMMUNITY): Payer: Self-pay | Admitting: Anesthesiology

## 2011-07-02 DIAGNOSIS — R52 Pain, unspecified: Secondary | ICD-10-CM

## 2011-07-03 ENCOUNTER — Ambulatory Visit (HOSPITAL_COMMUNITY)
Admission: RE | Admit: 2011-07-03 | Discharge: 2011-07-03 | Disposition: A | Discharge status: Home / Self Care | Payer: 59 | Source: Ambulatory Visit | Attending: Anesthesiology | Admitting: Anesthesiology

## 2011-07-03 DIAGNOSIS — M5126 Other intervertebral disc displacement, lumbar region: Secondary | ICD-10-CM | POA: Insufficient documentation

## 2011-07-03 DIAGNOSIS — R209 Unspecified disturbances of skin sensation: Secondary | ICD-10-CM | POA: Insufficient documentation

## 2011-07-03 DIAGNOSIS — M545 Low back pain, unspecified: Secondary | ICD-10-CM | POA: Insufficient documentation

## 2011-07-03 DIAGNOSIS — M5137 Other intervertebral disc degeneration, lumbosacral region: Secondary | ICD-10-CM | POA: Insufficient documentation

## 2011-07-03 DIAGNOSIS — R52 Pain, unspecified: Secondary | ICD-10-CM

## 2011-07-03 DIAGNOSIS — M79609 Pain in unspecified limb: Secondary | ICD-10-CM | POA: Insufficient documentation

## 2011-07-03 DIAGNOSIS — M51379 Other intervertebral disc degeneration, lumbosacral region without mention of lumbar back pain or lower extremity pain: Secondary | ICD-10-CM | POA: Insufficient documentation

## 2011-07-03 LAB — CREATININE, SERUM
Creatinine, Ser: 0.77 mg/dL (ref 0.50–1.35)
GFR calc non Af Amer: >60 mL/min (ref >60)

## 2011-07-03 LAB — BUN: BUN: 11 mg/dL (ref 6–23)

## 2011-07-03 MED ORDER — GADOBENATE DIMEGLUMINE 529 MG/ML IV SOLN
15.0000 mL | Freq: Once | INTRAVENOUS | Status: AC | PRN
  Administered 2011-07-03: 15 mL via INTRAVENOUS

## 2011-07-20 LAB — CBC
HCT: 41.1
Hemoglobin: 14.3
MCHC: 34.8
MCV: 91.3
Platelets: 185
RBC: 4.5
RDW: 12.9
WBC: 6.8

## 2011-07-20 LAB — BASIC METABOLIC PANEL
BUN: 14
CO2: 27
Calcium: 8.9
Chloride: 106
Creatinine, Ser: 0.94
GFR calc Af Amer: >60
GFR calc non Af Amer: >60
Glucose, Bld: 90
Potassium: 4
Sodium: 137

## 2012-04-18 ENCOUNTER — Other Ambulatory Visit (HOSPITAL_COMMUNITY): Payer: Self-pay | Admitting: Anesthesiology

## 2012-04-18 ENCOUNTER — Ambulatory Visit (HOSPITAL_COMMUNITY)
Admission: RE | Admit: 2012-04-18 | Discharge: 2012-04-18 | Disposition: A | Discharge status: Home / Self Care | Payer: 59 | Source: Ambulatory Visit | Attending: Anesthesiology | Admitting: Anesthesiology

## 2012-04-18 DIAGNOSIS — M7730 Calcaneal spur, unspecified foot: Secondary | ICD-10-CM | POA: Insufficient documentation

## 2012-04-18 DIAGNOSIS — Z981 Arthrodesis status: Secondary | ICD-10-CM | POA: Insufficient documentation

## 2012-04-18 DIAGNOSIS — M2550 Pain in unspecified joint: Secondary | ICD-10-CM

## 2012-04-18 DIAGNOSIS — M949 Disorder of cartilage, unspecified: Secondary | ICD-10-CM | POA: Insufficient documentation

## 2012-04-18 DIAGNOSIS — M899 Disorder of bone, unspecified: Secondary | ICD-10-CM | POA: Insufficient documentation

## 2012-05-21 ENCOUNTER — Ambulatory Visit (HOSPITAL_COMMUNITY)
Admission: RE | Admit: 2012-05-21 | Discharge: 2012-05-21 | Disposition: A | Discharge status: Home / Self Care | Payer: 59 | Source: Ambulatory Visit | Attending: Anesthesiology | Admitting: Anesthesiology

## 2012-05-21 ENCOUNTER — Other Ambulatory Visit (HOSPITAL_COMMUNITY): Payer: Self-pay | Admitting: Anesthesiology

## 2012-05-21 DIAGNOSIS — R52 Pain, unspecified: Secondary | ICD-10-CM

## 2012-05-21 DIAGNOSIS — M25559 Pain in unspecified hip: Secondary | ICD-10-CM | POA: Insufficient documentation

## 2012-07-30 ENCOUNTER — Other Ambulatory Visit: Payer: Self-pay | Admitting: Neurosurgery

## 2012-07-30 DIAGNOSIS — M5410 Radiculopathy, site unspecified: Secondary | ICD-10-CM

## 2012-07-30 DIAGNOSIS — M549 Dorsalgia, unspecified: Secondary | ICD-10-CM

## 2012-08-04 ENCOUNTER — Ambulatory Visit
Admission: RE | Admit: 2012-08-04 | Discharge: 2012-08-04 | Disposition: A | Discharge status: Home / Self Care | Payer: 59 | Source: Ambulatory Visit | Attending: Neurosurgery | Admitting: Neurosurgery

## 2012-08-04 VITALS — BP 101/78 | HR 65 | Ht 72.0 in | Wt 175.0 lb

## 2012-08-04 DIAGNOSIS — M5410 Radiculopathy, site unspecified: Secondary | ICD-10-CM

## 2012-08-04 DIAGNOSIS — M549 Dorsalgia, unspecified: Secondary | ICD-10-CM

## 2012-08-04 MED ORDER — ONDANSETRON HCL 4 MG/2ML IJ SOLN
4.0000 mg | Freq: Once | INTRAMUSCULAR | Status: AC
  Administered 2012-08-04: 4 mg via INTRAMUSCULAR

## 2012-08-04 MED ORDER — ONDANSETRON HCL 4 MG/2ML IJ SOLN: 4.0000 mg | Freq: Four times a day (QID) | INTRAMUSCULAR | Status: DC | PRN

## 2012-08-04 MED ORDER — IOHEXOL 180 MG/ML  SOLN
18.0000 mL | Freq: Once | INTRAMUSCULAR | Status: AC | PRN
  Administered 2012-08-04: 18 mL via INTRATHECAL

## 2012-08-04 MED ORDER — DIAZEPAM 5 MG PO TABS
10.0000 mg | ORAL_TABLET | Freq: Once | ORAL | Status: AC
  Administered 2012-08-04: 10 mg via ORAL

## 2012-08-04 MED ORDER — MEPERIDINE HCL 100 MG/ML IJ SOLN
75.0000 mg | Freq: Once | INTRAMUSCULAR | Status: AC
  Administered 2012-08-04: 75 mg via INTRAMUSCULAR

## 2012-08-06 ENCOUNTER — Telehealth: Payer: Self-pay

## 2012-08-06 ENCOUNTER — Other Ambulatory Visit: Payer: Self-pay | Admitting: Neurosurgery

## 2012-08-06 DIAGNOSIS — G971 Other reaction to spinal and lumbar puncture: Secondary | ICD-10-CM

## 2012-08-06 NOTE — Telephone Encounter (Signed)
Pt explained he is to see Dr. Jeral Fruit today. Pt has a positional headache but has no driver.  Explained he must have a driver and an order for the blood patch before we can help him.  Will ask Dr. Jeral Fruit for a blood patch order. [email or web address]

## 2012-08-07 ENCOUNTER — Ambulatory Visit
Admission: RE | Admit: 2012-08-07 | Discharge: 2012-08-07 | Disposition: A | Discharge status: Home / Self Care | Payer: 59 | Source: Ambulatory Visit | Attending: Neurosurgery | Admitting: Neurosurgery

## 2012-08-07 DIAGNOSIS — G971 Other reaction to spinal and lumbar puncture: Secondary | ICD-10-CM

## 2012-08-07 MED ORDER — IOHEXOL 180 MG/ML  SOLN
1.0000 mL | Freq: Once | INTRAMUSCULAR | Status: AC | PRN
  Administered 2012-08-07: 1 mL via EPIDURAL

## 2012-08-07 NOTE — Progress Notes (Signed)
Pt's blood was drawn from left [facility] with 19 g butterfly. 20 cc of blood obtained, site unremarkable, pt tolerated well. JKL RN

## 2012-08-26 ENCOUNTER — Other Ambulatory Visit: Payer: Self-pay | Admitting: Neurosurgery

## 2012-09-05 ENCOUNTER — Encounter (HOSPITAL_COMMUNITY): Payer: Self-pay | Admitting: Pharmacy Technician

## 2012-09-10 ENCOUNTER — Encounter (HOSPITAL_COMMUNITY): Payer: Self-pay

## 2012-09-10 ENCOUNTER — Encounter (HOSPITAL_COMMUNITY)
Admission: RE | Admit: 2012-09-10 | Discharge: 2012-09-10 | Disposition: A | Discharge status: Home / Self Care | Payer: 59 | Source: Ambulatory Visit | Attending: Neurosurgery | Admitting: Neurosurgery

## 2012-09-10 HISTORY — DX: Anxiety disorder, unspecified: F41.9

## 2012-09-10 HISTORY — DX: Headache: R51

## 2012-09-10 HISTORY — DX: Unspecified osteoarthritis, unspecified site: M19.90

## 2012-09-10 HISTORY — DX: Major depressive disorder, single episode, unspecified: F32.9

## 2012-09-10 HISTORY — DX: Depression, unspecified: F32.A

## 2012-09-10 LAB — CBC
HCT: 41.3 % (ref 39.0–52.0)
Hemoglobin: 13.5 g/dL (ref 13.0–17.0)
RBC: 4.42 MIL/uL (ref 4.22–5.81)
RDW: 13 % (ref 11.5–15.5)
WBC: 7 10*3/uL (ref 4.0–10.5)

## 2012-09-10 LAB — TYPE AND SCREEN

## 2012-09-10 NOTE — Pre-Procedure Instructions (Signed)
20 BURT PIATEK  09/10/2012   Your procedure is scheduled on:  Wednesday December 11  Report to [facility] Stay [facility] at 10:00 AM.  Call this number if you have problems the morning of surgery: [phone]   Remember:   Do not eat food or drink liquids:After Midnight.      Take these medicines the morning of surgery with A SIP OF WATER: valium,neurontin,or percocet if needed   Do not wear jewelry, make-up or nail polish.  Do not wear lotions, powders, or perfumes. You may wear deodorant.  Do not shave 48 hours prior to surgery. Men may shave face and neck.  Do not bring valuables to the hospital.  Contacts, dentures or bridgework may not be worn into surgery.  Leave suitcase in the car. After surgery it may be brought to your room.  For patients admitted to the hospital, checkout time is 11:00 AM the day of discharge.   Patients discharged the day of surgery will not be allowed to drive home.  Name and phone number of your driver:   Special Instructions: Shower using CHG 2 nights before surgery and the night before surgery.  If you shower the day of surgery use CHG.  Use special wash - you have one bottle of CHG for all showers.  You should use approximately 1/3 of the bottle for each shower.   Please read over the following fact sheets that you were given: Pain Booklet, Coughing and Deep Breathing, Blood Transfusion Information, MRSA Information and Surgical Site Infection Prevention

## 2012-09-16 MED ORDER — CEFAZOLIN SODIUM-DEXTROSE 2-3 GM-% IV SOLR
2.0000 g | INTRAVENOUS | Status: AC
  Administered 2012-09-17: 2 g via INTRAVENOUS
  Filled 2012-09-16: qty 50

## 2012-09-17 ENCOUNTER — Inpatient Hospital Stay (HOSPITAL_COMMUNITY)
Admission: RE | Admit: 2012-09-17 | Discharge: 2012-09-22 | DRG: 460 | Disposition: A | Discharge status: Home / Self Care | Payer: 59 | Source: Ambulatory Visit | Attending: Neurosurgery | Admitting: Neurosurgery

## 2012-09-17 ENCOUNTER — Encounter (HOSPITAL_COMMUNITY): Payer: Self-pay | Admitting: *Deleted

## 2012-09-17 ENCOUNTER — Inpatient Hospital Stay (HOSPITAL_COMMUNITY): Payer: 59

## 2012-09-17 ENCOUNTER — Encounter (HOSPITAL_COMMUNITY): Payer: Self-pay | Admitting: Anesthesiology

## 2012-09-17 ENCOUNTER — Inpatient Hospital Stay (HOSPITAL_COMMUNITY): Payer: 59 | Admitting: Anesthesiology

## 2012-09-17 ENCOUNTER — Encounter (HOSPITAL_COMMUNITY)
Admission: RE | Disposition: A | Discharge status: Home / Self Care | Payer: Self-pay | Source: Ambulatory Visit | Attending: Neurosurgery

## 2012-09-17 DIAGNOSIS — F17200 Nicotine dependence, unspecified, uncomplicated: Secondary | ICD-10-CM | POA: Diagnosis present

## 2012-09-17 DIAGNOSIS — R51 Headache: Secondary | ICD-10-CM | POA: Diagnosis present

## 2012-09-17 DIAGNOSIS — F329 Major depressive disorder, single episode, unspecified: Secondary | ICD-10-CM | POA: Diagnosis present

## 2012-09-17 DIAGNOSIS — M129 Arthropathy, unspecified: Secondary | ICD-10-CM | POA: Diagnosis present

## 2012-09-17 DIAGNOSIS — F411 Generalized anxiety disorder: Secondary | ICD-10-CM | POA: Diagnosis present

## 2012-09-17 DIAGNOSIS — F3289 Other specified depressive episodes: Secondary | ICD-10-CM | POA: Diagnosis present

## 2012-09-17 DIAGNOSIS — Q762 Congenital spondylolisthesis: Principal | ICD-10-CM

## 2012-09-17 SURGERY — POSTERIOR LUMBAR FUSION 1 LEVEL
Anesthesia: General | Site: Back | Wound class: Clean

## 2012-09-17 MED ORDER — SODIUM CHLORIDE 0.9 % IV SOLN: 250.0000 mL | INTRAVENOUS | Status: DC

## 2012-09-17 MED ORDER — MORPHINE SULFATE (PF) 1 MG/ML IV SOLN
INTRAVENOUS | Status: AC
  Administered 2012-09-17: 23:00:00
  Filled 2012-09-17: qty 25

## 2012-09-17 MED ORDER — OXYCODONE-ACETAMINOPHEN 5-325 MG PO TABS
ORAL_TABLET | ORAL | Status: AC
  Filled 2012-09-17: qty 2

## 2012-09-17 MED ORDER — SODIUM CHLORIDE 0.9 % IJ SOLN
3.0000 mL | Freq: Two times a day (BID) | INTRAMUSCULAR | Status: DC
  Administered 2012-09-19 – 2012-09-21 (×4): 3 mL via INTRAVENOUS

## 2012-09-17 MED ORDER — ROCURONIUM BROMIDE 100 MG/10ML IV SOLN
INTRAVENOUS | Status: DC | PRN
  Administered 2012-09-17: 40 mg via INTRAVENOUS
  Administered 2012-09-17 (×3): 10 mg via INTRAVENOUS

## 2012-09-17 MED ORDER — CEFAZOLIN SODIUM 1-5 GM-% IV SOLN
INTRAVENOUS | Status: AC
  Filled 2012-09-17: qty 50

## 2012-09-17 MED ORDER — SODIUM CHLORIDE 0.9 % IJ SOLN: 9.0000 mL | INTRAMUSCULAR | Status: DC | PRN

## 2012-09-17 MED ORDER — ONDANSETRON HCL 4 MG/2ML IJ SOLN
INTRAMUSCULAR | Status: DC | PRN
  Administered 2012-09-17: 4 mg via INTRAVENOUS

## 2012-09-17 MED ORDER — ONDANSETRON HCL 4 MG/2ML IJ SOLN: 4.0000 mg | Freq: Once | INTRAMUSCULAR | Status: DC | PRN

## 2012-09-17 MED ORDER — HYDROMORPHONE HCL PF 1 MG/ML IJ SOLN
INTRAMUSCULAR | Status: AC
  Filled 2012-09-17: qty 1

## 2012-09-17 MED ORDER — PROPOFOL 10 MG/ML IV BOLUS
INTRAVENOUS | Status: DC | PRN
  Administered 2012-09-17: 170 mg via INTRAVENOUS

## 2012-09-17 MED ORDER — SODIUM CHLORIDE 0.9 % IJ SOLN: 3.0000 mL | INTRAMUSCULAR | Status: DC | PRN

## 2012-09-17 MED ORDER — ONDANSETRON HCL 4 MG/2ML IJ SOLN: 4.0000 mg | INTRAMUSCULAR | Status: DC | PRN

## 2012-09-17 MED ORDER — DIAZEPAM 5 MG PO TABS
ORAL_TABLET | ORAL | Status: AC
  Filled 2012-09-17: qty 1

## 2012-09-17 MED ORDER — DIPHENHYDRAMINE HCL 50 MG/ML IJ SOLN: 12.5000 mg | Freq: Four times a day (QID) | INTRAMUSCULAR | Status: DC | PRN

## 2012-09-17 MED ORDER — DIAZEPAM 5 MG/ML IJ SOLN: 5.0000 mg | Freq: Once | INTRAMUSCULAR | Status: DC

## 2012-09-17 MED ORDER — CEFAZOLIN SODIUM 1-5 GM-% IV SOLN
1.0000 g | Freq: Three times a day (TID) | INTRAVENOUS | Status: AC
  Administered 2012-09-17 – 2012-09-18 (×2): 1 g via INTRAVENOUS
  Filled 2012-09-17 (×3): qty 50

## 2012-09-17 MED ORDER — ZOLPIDEM TARTRATE 10 MG PO TABS
10.0000 mg | ORAL_TABLET | Freq: Every evening | ORAL | Status: DC | PRN
  Filled 2012-09-17: qty 2

## 2012-09-17 MED ORDER — DIAZEPAM 5 MG/ML IJ SOLN
INTRAMUSCULAR | Status: AC
  Administered 2012-09-17: 5 mg
  Filled 2012-09-17: qty 2

## 2012-09-17 MED ORDER — NEOSTIGMINE METHYLSULFATE 1 MG/ML IJ SOLN
INTRAMUSCULAR | Status: DC | PRN
  Administered 2012-09-17: 3 mg via INTRAVENOUS

## 2012-09-17 MED ORDER — HYDROMORPHONE HCL PF 1 MG/ML IJ SOLN
0.2500 mg | INTRAMUSCULAR | Status: DC | PRN
  Administered 2012-09-17 (×4): 0.5 mg via INTRAVENOUS

## 2012-09-17 MED ORDER — DIPHENHYDRAMINE HCL 12.5 MG/5ML PO ELIX: 12.5000 mg | ORAL_SOLUTION | Freq: Four times a day (QID) | ORAL | Status: DC | PRN

## 2012-09-17 MED ORDER — HYDROMORPHONE HCL PF 1 MG/ML IJ SOLN: 0.2500 mg | INTRAMUSCULAR | Status: DC | PRN

## 2012-09-17 MED ORDER — LACTATED RINGERS IV SOLN
INTRAVENOUS | Status: DC | PRN
  Administered 2012-09-17 (×3): via INTRAVENOUS

## 2012-09-17 MED ORDER — ONDANSETRON HCL 4 MG/2ML IJ SOLN: 4.0000 mg | Freq: Four times a day (QID) | INTRAMUSCULAR | Status: DC | PRN

## 2012-09-17 MED ORDER — OXYCODONE-ACETAMINOPHEN 5-325 MG PO TABS
1.0000 | ORAL_TABLET | ORAL | Status: DC | PRN
  Administered 2012-09-17 (×2): 2 via ORAL
  Administered 2012-09-18: 1 via ORAL
  Administered 2012-09-18 – 2012-09-19 (×4): 2 via ORAL
  Filled 2012-09-17 (×7): qty 2

## 2012-09-17 MED ORDER — MENTHOL 3 MG MT LOZG: 1.0000 | LOZENGE | OROMUCOSAL | Status: DC | PRN

## 2012-09-17 MED ORDER — ACETAMINOPHEN 325 MG PO TABS
650.0000 mg | ORAL_TABLET | ORAL | Status: DC | PRN
  Administered 2012-09-18 – 2012-09-22 (×7): 650 mg via ORAL
  Filled 2012-09-17 (×7): qty 2

## 2012-09-17 MED ORDER — THROMBIN 20000 UNITS EX KIT
PACK | CUTANEOUS | Status: DC | PRN
  Administered 2012-09-17: 20000 [IU] via TOPICAL

## 2012-09-17 MED ORDER — DIAZEPAM 5 MG PO TABS
5.0000 mg | ORAL_TABLET | Freq: Four times a day (QID) | ORAL | Status: DC | PRN
  Administered 2012-09-17 – 2012-09-20 (×8): 5 mg via ORAL
  Filled 2012-09-17 (×7): qty 1

## 2012-09-17 MED ORDER — MORPHINE SULFATE (PF) 1 MG/ML IV SOLN
INTRAVENOUS | Status: DC
  Administered 2012-09-17: 19:00:00 via INTRAVENOUS
  Administered 2012-09-17: 4.5 mg via INTRAVENOUS
  Administered 2012-09-18: 8.52 mg via INTRAVENOUS
  Administered 2012-09-18: 25.4 mg via INTRAVENOUS
  Administered 2012-09-18: 4.5 mg via INTRAVENOUS
  Administered 2012-09-18: 7.5 mg via INTRAVENOUS
  Administered 2012-09-18: 12 mg via INTRAVENOUS
  Administered 2012-09-19: 9.4 mg via INTRAVENOUS
  Administered 2012-09-19: 1.9 mg via INTRAVENOUS
  Administered 2012-09-19: 14.31 mg via INTRAVENOUS
  Filled 2012-09-17 (×4): qty 25

## 2012-09-17 MED ORDER — MIDAZOLAM HCL 5 MG/5ML IJ SOLN
INTRAMUSCULAR | Status: DC | PRN
  Administered 2012-09-17: 2 mg via INTRAVENOUS

## 2012-09-17 MED ORDER — NALOXONE HCL 0.4 MG/ML IJ SOLN: 0.4000 mg | INTRAMUSCULAR | Status: DC | PRN

## 2012-09-17 MED ORDER — GABAPENTIN 400 MG PO CAPS
400.0000 mg | ORAL_CAPSULE | Freq: Three times a day (TID) | ORAL | Status: DC
  Administered 2012-09-17 – 2012-09-22 (×15): 400 mg via ORAL
  Filled 2012-09-17 (×17): qty 1

## 2012-09-17 MED ORDER — SODIUM CHLORIDE 0.9 % IV SOLN
INTRAVENOUS | Status: DC
  Administered 2012-09-17: 22:00:00 via INTRAVENOUS
  Administered 2012-09-18: 1000 mL via INTRAVENOUS
  Administered 2012-09-18: 09:00:00 via INTRAVENOUS

## 2012-09-17 MED ORDER — ACETAMINOPHEN 10 MG/ML IV SOLN: 1000.0000 mg | Freq: Once | INTRAVENOUS | Status: DC | PRN

## 2012-09-17 MED ORDER — ACETAMINOPHEN 650 MG RE SUPP: 650.0000 mg | RECTAL | Status: DC | PRN

## 2012-09-17 MED ORDER — ACETAMINOPHEN 10 MG/ML IV SOLN
1000.0000 mg | Freq: Once | INTRAVENOUS | Status: AC | PRN
  Administered 2012-09-17: 1000 mg via INTRAVENOUS

## 2012-09-17 MED ORDER — GLYCOPYRROLATE 0.2 MG/ML IJ SOLN
INTRAMUSCULAR | Status: DC | PRN
  Administered 2012-09-17: 0.6 mg via INTRAVENOUS

## 2012-09-17 MED ORDER — LIDOCAINE HCL (CARDIAC) 20 MG/ML IV SOLN
INTRAVENOUS | Status: DC | PRN
  Administered 2012-09-17: 75 mg via INTRAVENOUS

## 2012-09-17 MED ORDER — 0.9 % SODIUM CHLORIDE (POUR BTL) OPTIME
TOPICAL | Status: DC | PRN
  Administered 2012-09-17: 1000 mL

## 2012-09-17 MED ORDER — HEMOSTATIC AGENTS (NO CHARGE) OPTIME
TOPICAL | Status: DC | PRN
  Administered 2012-09-17: 1 via TOPICAL

## 2012-09-17 MED ORDER — FENTANYL CITRATE 0.05 MG/ML IJ SOLN
INTRAMUSCULAR | Status: DC | PRN
  Administered 2012-09-17 (×2): 50 ug via INTRAVENOUS
  Administered 2012-09-17: 150 ug via INTRAVENOUS
  Administered 2012-09-17 (×2): 100 ug via INTRAVENOUS
  Administered 2012-09-17: 50 ug via INTRAVENOUS

## 2012-09-17 MED ORDER — PHENOL 1.4 % MT LIQD: 1.0000 | OROMUCOSAL | Status: DC | PRN

## 2012-09-17 SURGICAL SUPPLY — 68 items
BENZOIN TINCTURE PRP APPL 2/3 (GAUZE/BANDAGES/DRESSINGS) IMPLANT
BLADE SURG ROTATE 9660 (MISCELLANEOUS) ×2 IMPLANT
BONE EQUIVA 5CC (Bone Implant) ×2 IMPLANT
BUR ACORN 6.0 (BURR) ×2 IMPLANT
BUR MATCHSTICK NEURO 3.0 LAGG (BURR) ×4 IMPLANT
CANISTER SUCTION 2500CC (MISCELLANEOUS) ×2 IMPLANT
CAP LOCKING REVERE (Cap) ×4 IMPLANT
CLOTH BEACON ORANGE TIMEOUT ST (SAFETY) ×2 IMPLANT
CONT SPEC 4OZ CLIKSEAL STRL BL (MISCELLANEOUS) ×2 IMPLANT
COVER BACK TABLE 24X17X13 BIG (DRAPES) IMPLANT
COVER TABLE BACK 60X90 (DRAPES) ×2 IMPLANT
DRAPE C-ARM 42X72 X-RAY (DRAPES) ×4 IMPLANT
DRAPE LAPAROTOMY 100X72X124 (DRAPES) ×2 IMPLANT
DRAPE POUCH INSTRU U-SHP 10X18 (DRAPES) ×2 IMPLANT
DRSG PAD ABDOMINAL 8X10 ST (GAUZE/BANDAGES/DRESSINGS) ×2 IMPLANT
DURAPREP 26ML APPLICATOR (WOUND CARE) ×2 IMPLANT
ELECT REM PT RETURN 9FT ADLT (ELECTROSURGICAL) ×2
ELECTRODE REM PT RTRN 9FT ADLT (ELECTROSURGICAL) ×1 IMPLANT
EVACUATOR 1/8 PVC DRAIN (DRAIN) IMPLANT
EVACUATOR 3/16  PVC DRAIN (DRAIN) ×1
EVACUATOR 3/16 PVC DRAIN (DRAIN) ×1 IMPLANT
GAUZE SPONGE 4X4 16PLY XRAY LF (GAUZE/BANDAGES/DRESSINGS) ×2 IMPLANT
GLOVE BIOGEL M 8.0 STRL (GLOVE) ×4 IMPLANT
GLOVE BIOGEL PI IND STRL 7.5 (GLOVE) ×1 IMPLANT
GLOVE BIOGEL PI INDICATOR 7.5 (GLOVE) ×1
GLOVE ECLIPSE 7.5 STRL STRAW (GLOVE) ×6 IMPLANT
GLOVE EXAM NITRILE LRG STRL (GLOVE) IMPLANT
GLOVE EXAM NITRILE MD LF STRL (GLOVE) IMPLANT
GLOVE EXAM NITRILE XL STR (GLOVE) IMPLANT
GLOVE EXAM NITRILE XS STR PU (GLOVE) IMPLANT
GLOVE SS BIOGEL STRL SZ 6.5 (GLOVE) ×2 IMPLANT
GLOVE SUPERSENSE BIOGEL SZ 6.5 (GLOVE) ×2
GOWN BRE IMP SLV AUR LG STRL (GOWN DISPOSABLE) ×4 IMPLANT
GOWN BRE IMP SLV AUR XL STRL (GOWN DISPOSABLE) ×4 IMPLANT
GOWN STRL REIN 2XL LVL4 (GOWN DISPOSABLE) IMPLANT
KIT BASIN OR (CUSTOM PROCEDURE TRAY) ×2 IMPLANT
KIT ROOM TURNOVER OR (KITS) ×2 IMPLANT
NEEDLE HYPO 18GX1.5 BLUNT FILL (NEEDLE) IMPLANT
NEEDLE HYPO 21X1.5 SAFETY (NEEDLE) IMPLANT
NEEDLE HYPO 25X1 1.5 SAFETY (NEEDLE) ×2 IMPLANT
NS IRRIG 1000ML POUR BTL (IV SOLUTION) ×2 IMPLANT
PACK LAMINECTOMY NEURO (CUSTOM PROCEDURE TRAY) ×2 IMPLANT
PAD ARMBOARD 7.5X6 YLW CONV (MISCELLANEOUS) ×6 IMPLANT
PATTIES SURGICAL .5 X1 (DISPOSABLE) ×2 IMPLANT
PATTIES SURGICAL .5 X3 (DISPOSABLE) IMPLANT
ROD CURVED 5.5X45MM (Rod) ×4 IMPLANT
SCREW NON BREAK OFF LGY 55 (Screw) ×4 IMPLANT
SCREW PEDICLE 6.5MMX45MM (Screw) ×4 IMPLANT
SCREW PEDICLE 6.5X45 (Screw) ×2 IMPLANT
SPACER SUSTAIN O SM 8X22 10M (Spacer) ×4 IMPLANT
SPONGE GAUZE 4X4 12PLY (GAUZE/BANDAGES/DRESSINGS) IMPLANT
SPONGE LAP 4X18 X RAY DECT (DISPOSABLE) IMPLANT
SPONGE NEURO XRAY DETECT 1X3 (DISPOSABLE) IMPLANT
SPONGE SURGIFOAM ABS GEL 100 (HEMOSTASIS) ×2 IMPLANT
STRIP CLOSURE SKIN 1/2X4 (GAUZE/BANDAGES/DRESSINGS) ×2 IMPLANT
SUT VIC AB 1 CT1 18XBRD ANBCTR (SUTURE) ×2 IMPLANT
SUT VIC AB 1 CT1 8-18 (SUTURE) ×2
SUT VIC AB 2-0 CP2 18 (SUTURE) ×4 IMPLANT
SUT VIC AB 3-0 SH 8-18 (SUTURE) ×2 IMPLANT
SYR 20CC LL (SYRINGE) IMPLANT
SYR 20ML ECCENTRIC (SYRINGE) ×2 IMPLANT
SYR 5ML LL (SYRINGE) IMPLANT
T CONNECTOR ADJ 38MM-51MM (Connector) ×2 IMPLANT
TAPE CLOTH SURG 4X10 WHT LF (GAUZE/BANDAGES/DRESSINGS) ×2 IMPLANT
TOWEL OR 17X24 6PK STRL BLUE (TOWEL DISPOSABLE) ×2 IMPLANT
TOWEL OR 17X26 10 PK STRL BLUE (TOWEL DISPOSABLE) ×2 IMPLANT
TRAY FOLEY CATH 14FRSI W/METER (CATHETERS) ×2 IMPLANT
WATER STERILE IRR 1000ML POUR (IV SOLUTION) ×2 IMPLANT

## 2012-09-17 NOTE — Progress Notes (Signed)
Op note [id number]

## 2012-09-17 NOTE — Anesthesia Postprocedure Evaluation (Signed)
Anesthesia Post Note  Patient: Bradley Espinoza  Procedure(s) Performed: Procedure(s) (LRB): POSTERIOR LUMBAR FUSION 1 LEVEL (N/A)  Anesthesia type: general  Patient location: PACU  Post pain: Pain level controlled  Post assessment: Patient's Cardiovascular Status Stable  Last Vitals:  Filed Vitals:   09/17/12 1832  BP: 146/95  Pulse: 64  Temp:   Resp: 17    Post vital signs: Reviewed and stable  Level of consciousness: sedated  Complications: No apparent anesthesia complications

## 2012-09-17 NOTE — Anesthesia Preprocedure Evaluation (Addendum)
Anesthesia Evaluation  Patient identified by MRN, date of birth, ID band Patient awake    Reviewed: Allergy & Precautions, H&P , NPO status , Patient's Chart, lab work & pertinent test results  Airway Mallampati: I      Dental  (+) Partial Upper, Teeth Intact and Dental Advisory Given   Pulmonary  breath sounds clear to auscultation        Cardiovascular Rhythm:Regular Rate:Normal     Neuro/Psych    GI/Hepatic   Endo/Other    Renal/GU      Musculoskeletal   Abdominal   Peds  Hematology   Anesthesia Other Findings   Reproductive/Obstetrics                           Anesthesia Physical Anesthesia Plan  ASA: II  Anesthesia Plan: General   Post-op Pain Management:    Induction: Intravenous  Airway Management Planned: Oral ETT  Additional Equipment:   Intra-op Plan:   Post-operative Plan:   Informed Consent: I have reviewed the patients History and Physical, chart, labs and discussed the procedure including the risks, benefits and alternatives for the proposed anesthesia with the patient or authorized representative who has indicated his/her understanding and acceptance.   Dental advisory given  Plan Discussed with: CRNA and Surgeon  Anesthesia Plan Comments: (Lumbar spondylosis Smoker  Plan GA with oral ETT  Kipp Brood, MD)       Anesthesia Quick Evaluation

## 2012-09-17 NOTE — Anesthesia Procedure Notes (Signed)
Procedure Name: Intubation Date/Time: 09/17/2012 3:07 PM Performed by: Gwenyth Allegra Pre-anesthesia Checklist: Patient identified, Timeout performed, Emergency Drugs available and Suction available Patient Re-evaluated:Patient Re-evaluated prior to inductionOxygen Delivery Method: Circle system utilized Preoxygenation: Pre-oxygenation with 100% oxygen Intubation Type: IV induction Ventilation: Oral airway inserted - appropriate to patient size Laryngoscope Size: Mac and 4 Grade View: Grade I Tube type: Oral Tube size: 7.5 mm Number of attempts: 1 Airway Equipment and Method: Stylet Placement Confirmation: ETT inserted through vocal cords under direct vision,  breath sounds checked- equal and bilateral and positive ETCO2 Secured at: 22 cm Tube secured with: Tape Dental Injury: Teeth and Oropharynx as per pre-operative assessment

## 2012-09-17 NOTE — H&P (Signed)
Bradley Espinoza is an 56 y.o. male.   Chief Complaint: lower back pain HPI: two years history of lower back pain with radiation to the right leg. Patient has had epidural plus conservative treatment without relief.  Past Medical History  Diagnosis Date  . Anxiety   . Depression   . Headache   . Arthritis     Past Surgical History  Procedure Date  . Back surgery 2004  . Cervical spine surgery 2008    No family history on file. Social History:  reports that he has been smoking Cigarettes.  He started smoking about 35 years ago. He has been smoking about 1 pack per day. He does not have any smokeless tobacco history on file. He reports that he does not drink alcohol or use illicit drugs.  Allergies: No Known Allergies  No prescriptions prior to admission    No results found for this or any previous visit (from the past 48 hour(s)). No results found.  Review of Systems  Constitutional: Negative.   HENT: Negative.   Eyes: Negative.   Respiratory: Negative.   Cardiovascular: Negative.   Gastrointestinal: Negative.   Genitourinary: Negative.   Musculoskeletal: Positive for back pain.  Skin: Negative.   Neurological: Positive for sensory change and focal weakness.  Endo/Heme/Allergies: Negative.   Psychiatric/Behavioral: Negative.     There were no vitals taken for this visit. Physical Exam hent,nl. Neck, nl. Lungs,clear. Cv,nl. Abdomen, nl. Extremities, nl. NEURO WEAKNESS OF DF OF RIGHT FOOT. Sensory,nl. Dtr,nl. SLR positive in right side at 75. Old scar in lumbar area. Radiological studies solid fusion at l5s1. sepoff at l45. Stenosis and ddd at l45   Assessment/Plan decompression and fusion at l4-5. Patient aware of risks and benefits.   Bradley Espinoza M 09/17/2012, 9:12 AM

## 2012-09-17 NOTE — Transfer of Care (Signed)
Immediate Anesthesia Transfer of Care Note  Patient: Bradley Espinoza  Procedure(s) Performed: Procedure(s) (LRB) with comments: POSTERIOR LUMBAR FUSION 1 LEVEL (N/A) - Lumbar four-five Diskectomy, posterolateral arthrodesis, augmentation of fusion per previous fusion  Patient Location: PACU  Anesthesia Type:General  Level of Consciousness: awake, alert  and oriented  Airway & Oxygen Therapy: Patient Spontanous Breathing and Patient connected to nasal cannula oxygen  Post-op Assessment: Report given to PACU RN and Post -op Vital signs reviewed and stable  Post vital signs: Reviewed and stable  Complications: No apparent anesthesia complications

## 2012-09-17 NOTE — Plan of Care (Signed)
Problem: Consults Goal: Diagnosis - Spinal Surgery Thoraco/Lumbar Spine Fusion     

## 2012-09-18 NOTE — Evaluation (Signed)
Physical Therapy Evaluation Patient Details Name: Bradley Espinoza MRN: [id number] DOB: 03-06-1956 Today's Date: 09/18/2012 Time: 9629-5284 PT Time Calculation (min): 27 min  PT Assessment / Plan / Recommendation Clinical Impression  Pt s/p PLF L4-5. Pt moving well during bed mobiltiy, although requires increased assisatnce for stability during stand and ambulation. Pt complained of headache with mobility this session limiting distance walked. RN aware. Pt encouraged to ambulate 2-3 x more today with nursing. Pt will benefit from skilled PT in the acute care setting in order to maximize functional mobilty and safety prior to d/c home with wife    PT Assessment  Patient needs continued PT services    Follow Up Recommendations  Home health PT;Supervision for mobility/OOB    Does the patient have the potential to tolerate intense rehabilitation      Barriers to Discharge        Equipment Recommendations  None recommended by PT    Recommendations for Other Services     Frequency Min 5X/week    Precautions / Restrictions Precautions Precautions: Back Precaution Booklet Issued: Yes (comment) Precaution Comments: pt educated on 3/3 back precautions Required Braces or Orthoses: Spinal Brace Spinal Brace: Lumbar corset;Applied in sitting position Restrictions Weight Bearing Restrictions: No   Pertinent Vitals/Pain Pain 7/10. PCA encouraged.       Mobility  Bed Mobility Bed Mobility: Rolling Left;Left Sidelying to Sit;Sitting - Scoot to Edge of Bed Rolling Left: 4: Min guard Left Sidelying to Sit: 4: Min guard Sitting - Scoot to [place] of Bed: 4: Min guard Details for Bed Mobility Assistance: VC throughout transfer for proper sequencing to maintain back precautions.  Transfers Transfers: Sit to Stand;Stand to Sit Sit to Stand: 4: Min assist;With upper extremity assist;From bed Stand to Sit: 4: Min assist;With upper extremity assist;To chair/3-in-1 Details for Transfer  Assistance: Min assist for stability and support. Cues for proper hand placement and to avoid twisting during transfer Ambulation/Gait Ambulation/Gait Assistance: 4: Min assist Ambulation Distance (Feet): 40 Feet Assistive device: Rolling walker Ambulation/Gait Assistance Details: VC for upright posture as well as safety with maintaining RW close to body for stability. Pt with extremely narrow BOS, crossing over at times. With cueing, pt able to keep feet shoulder width apart. Distance limited as pt complained of headache worse with walking, RN aware.  Gait Pattern: Narrow base of support;Trunk flexed;Scissoring;Decreased stride length Gait velocity: decreased gait speed    Shoulder Instructions     Exercises     PT Diagnosis: Difficulty walking;Acute pain  PT Problem List: Decreased activity tolerance;Decreased mobility;Decreased knowledge of use of DME;Decreased safety awareness;Decreased knowledge of precautions;Pain PT Treatment Interventions: DME instruction;Gait training;Stair training;Functional mobility training;Therapeutic activities;Patient/family education   PT Goals Acute Rehab PT Goals PT Goal Formulation: With patient Time For Goal Achievement: 09/25/12 Potential to Achieve Goals: Good Pt will go Supine/Side to Sit: with modified independence PT Goal: Supine/Side to Sit - Progress: Goal set today Pt will go Sit to Supine/Side: with modified independence PT Goal: Sit to Supine/Side - Progress: Goal set today Pt will go Sit to Stand: with modified independence PT Goal: Sit to Stand - Progress: Goal set today Pt will go Stand to Sit: with modified independence PT Goal: Stand to Sit - Progress: Goal set today Pt will Transfer Bed to Chair/Chair to Bed: with modified independence PT Transfer Goal: Bed to Chair/Chair to Bed - Progress: Goal set today Pt will Ambulate: >150 feet;with modified independence;with least restrictive assistive device PT Goal: Ambulate - Progress:  Goal set today Pt will Go Up / Down Stairs: Flight;with supervision;with rail(s) PT Goal: Up/Down Stairs - Progress: Goal set today Additional Goals Additional Goal #1: Pt will be able to verbalize and maintain 3/3 back precautions during all mobility PT Goal: Additional Goal #1 - Progress: Goal set today  Visit Information  Last PT Received On: 09/18/12 Assistance Needed: +1 PT/OT Co-Evaluation/Treatment: Yes    Subjective Data  Subjective: "I have a little headache, buti ts getting better" Patient Stated Goal: to go home   Prior Functioning  Home Living Lives With: Spouse Available Help at Discharge: Family;Available 24 hours/day Type of Home: House Home Access: Stairs to enter [employer] of Steps: 2 Entrance Stairs-Rails: None Home Layout: Two level Alternate Level Stairs-Number of Steps: 12 Alternate Level Stairs-Rails: Left Bathroom Shower/Tub: Tub/shower unit;Curtain Bathroom Toilet: Standard Bathroom Accessibility: Yes How Accessible: Accessible via walker Home Adaptive Equipment: Walker - rolling;Wheelchair - manual Prior Function Level of Independence: Independent Able to Take Stairs?: Yes Driving: Yes Vocation: On disability Communication Communication: No difficulties Dominant Hand: Right    Cognition  Overall Cognitive Status: Appears within functional limits for tasks assessed/performed Arousal/Alertness: Awake/alert Orientation Level: Appears intact for tasks assessed Behavior During Session: [facility] for tasks performed    Extremity/Trunk Assessment Right Lower Extremity Assessment RLE ROM/Strength/Tone: Within functional levels RLE Sensation: WFL - Light Touch Left Lower Extremity Assessment LLE ROM/Strength/Tone: Within functional levels LLE Sensation: WFL - Light Touch   Balance    End of Session PT - End of Session Equipment Utilized During Treatment: Gait belt;Back brace Activity Tolerance: Patient limited by pain Patient left: in  chair;with call bell/phone within reach Nurse Communication: Mobility status;Other (comment) (pts headache)    Milana Kidney 09/18/2012, 9:09 AM  09/18/2012 Milana Kidney DPT PAGER: [phone] OFFICE: [phone]

## 2012-09-18 NOTE — Clinical Social Work Note (Signed)
Clinical Social Work   CSW received consult for SNF. CSW reviewed chart and discussed pt with RN during progression. PT is recommending home health services. RNCM is aware and following. CSW is signing off of as no further needs identified. Please reconsult if a need arises prior to discharge.   Dede Query, MSW, Theresia Majors [phone]

## 2012-09-18 NOTE — Progress Notes (Signed)
Patient ID: Bradley Espinoza, male   DOB: 1956-09-28, 56 y.o.   MRN: [id number] Oob, no weakness. Sensory normal.

## 2012-09-18 NOTE — Evaluation (Signed)
Occupational Therapy Evaluation Patient Details Name: Bradley Espinoza MRN: [id number] DOB: Jun 17, 1956 Today's Date: 09/18/2012 Time: 4098-1191 OT Time Calculation (min): 27 min  OT Assessment / Plan / Recommendation Clinical Impression  This 56 yo male s/p back surgery presents to acute OT with deficits below. Will benefit from acute OT without need for followup.    OT Assessment  Patient needs continued OT Services    Follow Up Recommendations  No OT follow up    Barriers to Discharge None    Equipment Recommendations  3 in 1 bedside comode;Tub/shower seat (tub shower seat with back)       Frequency  Min 2X/week    Precautions / Restrictions Precautions Precautions: Back Precaution Booklet Issued: Yes (comment) Precaution Comments: pt educated on 3/3 back precautions Required Braces or Orthoses: Spinal Brace Spinal Brace: Lumbar corset;Applied in sitting position Restrictions Weight Bearing Restrictions: No   Pertinent Vitals/Pain 7/10 back    ADL  Eating/Feeding: Simulated;Independent Where Assessed - Eating/Feeding: Chair Grooming: Simulated;Set up Where Assessed - Grooming: Supported sitting Upper Body Bathing: Simulated;Set up Where Assessed - Upper Body Bathing: Supported sitting Lower Body Bathing: Simulated;Moderate assistance Where Assessed - Lower Body Bathing: Supported sit to stand Upper Body Dressing: Simulated;Minimal assistance Where Assessed - Upper Body Dressing: Supported sitting Lower Body Dressing: Simulated;Maximal assistance Where Assessed - Lower Body Dressing: Supported sit to Pharmacist, hospital: Mining engineer Method: Sit to Barista:  (Bed, out door, back to recliner) Toileting - Architect and Hygiene: Simulated;Minimal assistance Where Assessed - Engineer, mining and Hygiene:  (sit ot stand from bed) Equipment Used: Back brace;Rolling  walker Transfers/Ambulation Related to ADLs: Min A for all ADL Comments: Cannot cross legs to get to feet    OT Diagnosis: Generalized weakness;Acute pain  OT Problem List: Impaired balance (sitting and/or standing);Decreased knowledge of use of DME or AE;Decreased knowledge of precautions;Pain OT Treatment Interventions: Self-care/ADL training;DME and/or AE instruction;Patient/family education   OT Goals Acute Rehab OT Goals OT Goal Formulation: With patient Time For Goal Achievement: 09/25/12 Potential to Achieve Goals: Good ADL Goals Pt Will Perform Grooming: with supervision;Unsupported;Standing at sink ADL Goal: Grooming - Progress: Goal set today Pt Will Perform Lower Body Bathing: with set-up;Unsupported;with adaptive equipment;Sit to stand from chair;Sit to stand from bed ADL Goal: Lower Body Bathing - Progress: Goal set today Pt Will Perform Lower Body Dressing: Independently;Unsupported;with adaptive equipment;Sit to stand from bed;Sit to stand from chair ADL Goal: Lower Body Dressing - Progress: Goal set today Pt Will Transfer to Toilet: with supervision;Ambulation;with DME;Comfort height toilet;Grab bars ADL Goal: Toilet Transfer - Progress: Goal set today Pt Will Perform Toileting - Clothing Manipulation: with modified independence;Standing ADL Goal: Toileting - Clothing Manipulation - Progress: Goal set today Pt Will Perform Toileting - Hygiene: with modified independence;Sit to stand from 3-in-1/toilet ADL Goal: Toileting - Hygiene - Progress: Goal set today Pt Will Perform Tub/Shower Transfer: with supervision;Ambulation;with DME;Shower seat with back;Maintaining back safety precautions ADL Goal: Web designer - Progress: Goal set today Miscellaneous OT Goals Miscellaneous OT Goal #1: Pt will be Mod I in and OOB for BADLs OT Goal: Miscellaneous Goal #1 - Progress: Goal set today Miscellaneous OT Goal #2: Pt will be able to state and follow 3/3 back precautions OT  Goal: Miscellaneous Goal #2 - Progress: Goal set today Miscellaneous OT Goal #3: Pt will be able to don brace without A or cues OT Goal: Miscellaneous Goal #3 - Progress: Goal set today  Visit  Information  Last OT Received On: 09/18/12 Assistance Needed: +1 PT/OT Co-Evaluation/Treatment: Yes    Subjective Data  Patient Stated Goal: Did not ask   Prior Functioning     Home Living Lives With: Spouse Available Help at Discharge: Family;Available 24 hours/day Type of Home: House Home Access: Stairs to enter [employer] of Steps: 2 Entrance Stairs-Rails: None Home Layout: Two level Alternate Level Stairs-Number of Steps: 12 Alternate Level Stairs-Rails: Left Bathroom Shower/Tub: Tub/shower unit;Curtain Bathroom Toilet: Standard Bathroom Accessibility: Yes How Accessible: Accessible via walker Home Adaptive Equipment: Walker - rolling;Wheelchair - manual Prior Function Level of Independence: Independent Able to Take Stairs?: Yes Driving: Yes Vocation: On disability Communication Communication: No difficulties Dominant Hand: Right            Cognition  Overall Cognitive Status: Appears within functional limits for tasks assessed/performed Arousal/Alertness: Awake/alert Orientation Level: Appears intact for tasks assessed Behavior During Session: [facility] for tasks performed    Extremity/Trunk Assessment Right Upper Extremity Assessment RUE ROM/Strength/Tone: Within functional levels Left Upper Extremity Assessment LUE ROM/Strength/Tone: Within functional levels Right Lower Extremity Assessment RLE ROM/Strength/Tone: Within functional levels RLE Sensation: WFL - Light Touch Left Lower Extremity Assessment LLE ROM/Strength/Tone: Within functional levels LLE Sensation: WFL - Light Touch     Mobility Bed Mobility Bed Mobility: Rolling Left;Left Sidelying to Sit;Sitting - Scoot to Edge of Bed Rolling Left: 4: Min guard Left Sidelying to Sit: 4: Min  guard Sitting - Scoot to [place] of Bed: 4: Min guard Details for Bed Mobility Assistance: VC throughout transfer for proper sequencing to maintain back precautions.  Transfers Sit to Stand: 4: Min assist;With upper extremity assist;From bed Stand to Sit: 4: Min assist;With upper extremity assist;To chair/3-in-1 Details for Transfer Assistance: Min assist for stability and support. Cues for proper hand placement and to avoid twisting during transfer              End of Session OT - End of Session Equipment Utilized During Treatment: Gait belt Activity Tolerance: Patient tolerated treatment well Patient left: in chair;with call bell/phone within reach Nurse Communication: Mobility status (+1 A and needs to walk at least 2 more times today)       Evette Georges [phone] 09/18/2012, 10:11 AM

## 2012-09-18 NOTE — Progress Notes (Signed)
Foley cath d/c'd at this time. Tolerated well by patient.

## 2012-09-19 MED ORDER — OXYCODONE HCL 5 MG PO TABS
15.0000 mg | ORAL_TABLET | ORAL | Status: DC | PRN
  Administered 2012-09-19 – 2012-09-20 (×5): 15 mg via ORAL
  Filled 2012-09-19 (×5): qty 3

## 2012-09-19 MED ORDER — MINERAL OIL RE ENEM
1.0000 | ENEMA | Freq: Once | RECTAL | Status: AC
Start: 2012-09-19 — End: 2012-09-20
  Administered 2012-09-20: 1 via RECTAL
  Filled 2012-09-19: qty 1

## 2012-09-19 MED ORDER — MORPHINE SULFATE 2 MG/ML IJ SOLN
2.0000 mg | INTRAMUSCULAR | Status: DC | PRN
  Administered 2012-09-19: 2 mg via INTRAVENOUS
  Filled 2012-09-19 (×2): qty 1

## 2012-09-19 MED ORDER — POLYETHYLENE GLYCOL 3350 17 G PO PACK
17.0000 g | PACK | Freq: Every day | ORAL | Status: DC
  Administered 2012-09-19 – 2012-09-21 (×3): 17 g via ORAL
  Filled 2012-09-19 (×5): qty 1

## 2012-09-19 MED FILL — Heparin Sodium (Porcine) Inj 1000 Unit/ML: INTRAMUSCULAR | Qty: 30 | Status: AC

## 2012-09-19 MED FILL — Sodium Chloride IV Soln 0.9%: INTRAVENOUS | Qty: 1000 | Status: AC

## 2012-09-19 NOTE — Progress Notes (Signed)
Occupational Therapy Treatment Patient Details Name: Bradley Espinoza MRN: [id number] DOB: [birth year] Today's Date: 09/19/2012 Time: 9562-1308 OT Time Calculation (min): 24 min  OT Assessment / Plan / Recommendation Comments on Treatment Session This pt s/p back fusion surgery making progress.    Follow Up Recommendations  No OT follow up       Equipment Recommendations  3 in 1 bedside comode (tub shower seat with back)       Frequency Min 2X/week   Plan Discharge plan remains appropriate    Precautions / Restrictions Precautions Precautions: Back Precaution Comments: Pt not consistently following back precautions Required Braces or Orthoses: Spinal Brace Spinal Brace: Lumbar corset;Applied in sitting position Restrictions Weight Bearing Restrictions: No   Pertinent Vitals/Pain 7/10 back    ADL  Lower Body Dressing: Performed;Supervision/safety;Set up (with AE) Where Assessed - Lower Body Dressing: Unsupported sit to stand Toilet Transfer: Simulated;Min guard Toilet Transfer Method: Sit to Barista:  (from/to bed) Equipment Used: Ship broker ADL Comments: Educated pt on use of AE and let him practice with sock aide and shoe horn for socks and jogging pants. Made him aware that he can purchase AE in the gift shop      OT Goals ADL Goals ADL Goal: Lower Body Dressing - Progress: Progressing toward goals ADL Goal: Toilet Transfer - Progress: Progressing toward goals Miscellaneous OT Goals OT Goal: Miscellaneous Goal #1 - Progress: Progressing toward goals OT Goal: Miscellaneous Goal #2 - Progress: Progressing toward goals  Visit Information  Last OT Received On: 09/19/12 Assistance Needed: +1    Subjective Data  Subjective: Are you not going to give that to me? (AE). I explained again that we do not give the AE out, that it has to be purchased in the gift shop or else where      Cognition  Overall Cognitive Status: Impaired Area  of Impairment: Memory Arousal/Alertness: Awake/alert Orientation Level: Appears intact for tasks assessed Behavior During Session: [name] for tasks performed Memory: Decreased recall of precautions    Mobility   Bed Mobility Bed Mobility: Sit to Sidelying Left Sit to Sidelying Left: 5: Supervision Details for Bed Mobility Assistance: VCs not to twist Transfers Transfers: Sit to Stand;Stand to Sit Sit to Stand: 4: Min guard;With upper extremity assist;From bed Stand to Sit: 4: Min guard;With upper extremity assist;To bed Details for Transfer Assistance: VCs for hand placment             End of Session OT - End of Session Equipment Utilized During Treatment: Back brace (AE) Activity Tolerance: Patient tolerated treatment well Patient left: in bed;with call bell/phone within reach;with bed alarm set Nurse Communication:  (Need for pain meds)       Evette Georges [phone] 09/19/2012, 4:01 PM

## 2012-09-19 NOTE — Progress Notes (Signed)
Physical Therapy Treatment Patient Details Name: RAMERE DOWNS MRN: [id number] DOB: Feb 10, 1956 Today's Date: 09/19/2012 Time: 1478-2956 PT Time Calculation (min): 24 min  PT Assessment / Plan / Recommendation Comments on Treatment Session  Pt progressing well this session, may attempt ambulation wihtout RW next session. Pt able to complete full flight of stairs with min assist. Continue per plan tomorrow prior to d/c home for increased safety    Follow Up Recommendations  Supervision for mobility/OOB;No PT follow up     Does the patient have the potential to tolerate intense rehabilitation     Barriers to Discharge        Equipment Recommendations  None recommended by PT    Recommendations for Other Services    Frequency Min 5X/week   Plan Discharge plan remains appropriate;Frequency remains appropriate    Precautions / Restrictions Precautions Precautions: Back Precaution Comments: pt able to recall 2/3 back precautions; reeeducated Required Braces or Orthoses: Spinal Brace Spinal Brace: Lumbar corset;Applied in sitting position Restrictions Weight Bearing Restrictions: No   Pertinent Vitals/Pain Pain in back 4/10    Mobility  Bed Mobility Bed Mobility: Not assessed Transfers Transfers: Sit to Stand;Stand to Sit Sit to Stand: 4: Min guard;With upper extremity assist;From chair/3-in-1 Stand to Sit: 4: Min guard;With upper extremity assist;To bed Details for Transfer Assistance: Minguard for stability. Cues for safe hand placement and to avoid bending and twisting during transfer Ambulation/Gait Ambulation/Gait Assistance: 4: Min guard Ambulation Distance (Feet): 200 Feet Assistive device: Rolling walker Ambulation/Gait Assistance Details: VC for safe technique with RW to avoid bending over. Pt with flexed knees in stance, able to correct with cueing  Gait Pattern: Narrow base of support;Trunk flexed;Scissoring;Decreased stride length;Right flexed knee in  stance;Left flexed knee in stance Gait velocity: decreased gait speed Stairs: Yes Stairs Assistance: 4: Min assist Stairs Assistance Details (indicate cue type and reason): Min assist for support during stairs Stair Management Technique: One rail Right;Step to pattern;Forwards (HHA left) Number of Stairs: 12     Exercises     PT Diagnosis:    PT Problem List:   PT Treatment Interventions:     PT Goals Acute Rehab PT Goals PT Goal: Supine/Side to Sit - Progress: Progressing toward goal PT Goal: Sit to Supine/Side - Progress: Progressing toward goal PT Goal: Sit to Stand - Progress: Progressing toward goal PT Goal: Stand to Sit - Progress: Progressing toward goal PT Transfer Goal: Bed to Chair/Chair to Bed - Progress: Progressing toward goal PT Goal: Ambulate - Progress: Progressing toward goal PT Goal: Up/Down Stairs - Progress: Progressing toward goal Additional Goals PT Goal: Additional Goal #1 - Progress: Progressing toward goal  Visit Information  Last PT Received On: 09/19/12 Assistance Needed: +1    Subjective Data      Cognition  Overall Cognitive Status: Appears within functional limits for tasks assessed/performed Arousal/Alertness: Awake/alert Orientation Level: Appears intact for tasks assessed Behavior During Session: [facility] for tasks performed    Balance     End of Session PT - End of Session Equipment Utilized During Treatment: Gait belt;Back brace Activity Tolerance: Patient tolerated treatment well Patient left: in bed;with call bell/phone within reach;Other (comment) (sitting in bed with OT) Nurse Communication: Mobility status   GP     Milana Kidney 09/19/2012, 3:45 PM

## 2012-09-19 NOTE — Progress Notes (Signed)
Operative note was dictated same day of surgery. The number given was  [phone]. Please kindly adde to the chart

## 2012-09-19 NOTE — Op Note (Signed)
NAME:  Bradley Espinoza NO.:  [id number]  MEDICAL RECORD NO.:  [id number]  LOCATION:                                 FACILITY:  PHYSICIAN:  Hilda Lias, M.D.   DATE OF BIRTH:  [birth year]  DATE OF PROCEDURE:  09/17/2012 DATE OF DISCHARGE:                              OPERATIVE REPORT   PREOPERATIVE DIAGNOSIS:  L4-L5 stenosis with spondylolisthesis and radiculopathy status post L5-S1 fusion.  POSTOPERATIVE DIAGNOSIS:  L4-L5 stenosis with spondylolisthesis and radiculopathy status post L5-S1 fusion.  PROCEDURE:  L4 laminectomy, facetectomy, bilateral L4-L5 discectomy, more than normally to be able to insert 2 cages of 12 x 22, decompression of the L4 and L5 nerve root.  Lysis of adhesion, pedicle screws L4-L5, posterolateral arthrodesis with autograft and Bone extender.  Cell Saver, C-arm.  SURGEON:  Hilda Lias, M.D.  ASSISTANT:  Donalee Citrin, M.D.  CLINICAL HISTORY:  Bradley Espinoza is a 56 year old gentleman who about 7 years ago underwent L5-S1 fusion.  The patient did well, but lately he had been complaining of back pain worsened to both legs.  The patient had conservative treatment with medication, he is not any better. Myelogram showed slight spondylolisthesis at the level L4-L5 with stenosis.  The patient has a solid fusion of the level _l5-s1_________.  In view of no improvement, surgery was advised.  The patient knew the risk with the surgery.  PROCEDURE:  The patient was taken to the OR, and after intubation, he was positioned in a prone manner.  The back was cleaned, first with Betadine and later on with DuraPrep.  Drapes were applied.  Then, midline incision _resecting the previous one_________ was made from L3-L4 down both along the incision.  Muscles were retracted laterally.  I was able to see the previous pedicle of L5-S1.  From then on, we went laterally.  We were able to go beyond the facet of L4-5.  Then with the Leksell we removed the  spinous process of L4.  The lamina of L5, we did bilateral facetectomies.  The patient had quite a bit of scar and lysis was accomplished.  Decompression of the thecal sac as well the L4-L5 nerve root was accomplished.  Then, the retraction laterally, bilaterally, we entered the disk space and total gross diskectomy was achieved.  We removed the endplate.  We were able to introduce 2 cages of 12 x 26 with autograft bone extender inside.  Lateral C-arm x-ray showed good position of the cages.  Having done good decompression, we were able to remove the rod from the previous fusion.  We left the S1 screw in place as well as the L5.  At the level of L4, we probe the pedicle of L4 and we were able to introduce 2 screws of 5.5 x 45.  These 2 new screws were hooked with the previous _a_________ L5.  We decided to leave the S1 in place and away from the previous fusion.  Then after securing the screws in place, we used a rod and two sets of capps__________ because of 2 different system were used.  They are cross linked from right to left  _rod was done _________.  We proceeded with removal of  periosteum of L4-L5 facet and a mix of autograft bone extender were used for arthrodesis.  Valsalva maneuver was negative.  A Hemovac was left in the pleural space and a Vicryl and Steri-Strips.          ______________________________ Hilda Lias, M.D.     EB/MEDQ  D:  09/17/2012  T:  09/18/2012  Job:  [id number]

## 2012-09-19 NOTE — Care Management Note (Signed)
    Page 1 of 1   09/19/2012     3:08:02 PM   CARE MANAGEMENT NOTE 09/19/2012  Patient:  Bradley Espinoza, Bradley Espinoza   Account Number:  [id number]  Date Initiated:  09/18/2012  Documentation initiated by:  [facility]  Subjective/Objective Assessment:   Admitted postop     Action/Plan:   PT/OT evals- recs hhpt, 3n1 and tub bench.   Anticipated DC Date:  09/20/2012   Anticipated DC Plan:  HOME W HOME HEALTH SERVICES      DC Planning Services  CM consult      [facility] Choice  HOME HEALTH   Choice offered to / List presented to:  C-1 Patient           Status of service:  Completed, signed off Medicare Important Message given?   (If response is "NO", the following Medicare IM given date fields will be blank) Date Medicare IM given:   Date Additional Medicare IM given:    Discharge Disposition:  HOME/SELF CARE  Per UR Regulation:  Reviewed for med. necessity/level of care/duration of stay  If discussed at Long Length of Stay Meetings, dates discussed:    Comments:  09/19/12 11:35 Letha Cape RN, BSN [phone] patient lives with spouse, patient has medication coverage and transportation at dc.  Patient states he has a 3 n 1 at home and he has a shower chair, so he really does not need any more DME.  Patient also states if he has to pay a percentage for hhpt he does not want hhpt, I informed patient that I wil do a benefit check on this to see how much his insurance will pay. If they pay 100% he would like to work with [facility] agency.  Per benefits check hhpt will pay at 100% after $30 co pay per visit. Patient states he does not need hhpt , he was walking with therapist at the time and she also states he will not need hhpt.

## 2012-09-19 NOTE — Progress Notes (Signed)
Patient ID: Bradley Espinoza, male   DOB: [birth year], 56 y.o.   MRN: [id number] C/o incisional pain and constipation. No weakness.

## 2012-09-20 LAB — GLUCOSE, CAPILLARY: Glucose-Capillary: 178 mg/dL — ABNORMAL HIGH (ref 70–99)

## 2012-09-20 MED ORDER — DIAZEPAM 2 MG PO TABS
2.0000 mg | ORAL_TABLET | Freq: Every day | ORAL | Status: DC
  Administered 2012-09-20 – 2012-09-21 (×9): 2 mg via ORAL
  Filled 2012-09-20 (×9): qty 1

## 2012-09-20 MED ORDER — OXYCODONE HCL 5 MG PO TABS
15.0000 mg | ORAL_TABLET | ORAL | Status: DC | PRN
  Administered 2012-09-20 – 2012-09-21 (×5): 15 mg via ORAL
  Filled 2012-09-20 (×5): qty 3

## 2012-09-20 MED ORDER — MORPHINE SULFATE 2 MG/ML IJ SOLN
2.0000 mg | INTRAMUSCULAR | Status: DC | PRN
  Administered 2012-09-20 – 2012-09-21 (×6): 2 mg via INTRAVENOUS
  Filled 2012-09-20 (×5): qty 1

## 2012-09-20 NOTE — Progress Notes (Signed)
Patient ID: Bradley Espinoza, male   DOB: 24-May-1956, 56 y.o.   MRN: [id number] Increase ot temperature, c/o incisional pain ,no weakness, wet rales both lung bases, some headache.

## 2012-09-20 NOTE — Plan of Care (Signed)
Problem: Phase I Progression Outcomes Goal: Pain controlled with appropriate interventions Outcome: Progressing Pt continue to c/o of severe headache when sitting and up  ,lower back pain and pain to the rt side of neck  Dr Jeral Fruit saw pt this  am and new orders given  and carried out per orders . Pt verbalized some relief of pain when given around the clock.

## 2012-09-20 NOTE — Progress Notes (Signed)
Occupational Therapy Treatment Patient Details Name: Bradley Espinoza MRN: [id number] DOB: 25-Apr-1956 Today's Date: 09/20/2012 Time: 4098-1191 OT Time Calculation (min): 14 min  OT Assessment / Plan / Recommendation Comments on Treatment Session Pt is making progress with mobility and ADLs but does not maintain back precautions.    Follow Up Recommendations  No OT follow up    Barriers to Discharge       Equipment Recommendations  3 in 1 bedside comode    Recommendations for Other Services    Frequency Min 2X/week   Plan Discharge plan remains appropriate    Precautions / Restrictions Precautions Precautions: Back Precaution Comments: Pt inconsistent with following back precautions. Able to verbalize Required Braces or Orthoses: Spinal Brace Spinal Brace: Lumbar corset;Applied in sitting position Restrictions Weight Bearing Restrictions: No   Pertinent Vitals/Pain See vitals    ADL  Grooming: Performed;Wash/dry hands;Wash/dry face;Supervision/safety Where Assessed - Grooming: Unsupported standing Toilet Transfer: Buyer, retail Method: Sit to Barista: Other (comment) (chair) Equipment Used: Back brace;Rolling walker Transfers/Ambulation Related to ADLs: supervision with RW ADL Comments: Attempted to review AE education with pt, but pt very distracted with making in his bed.  Pt attempting to stand up from bed in order to fix his blankets but not remembering to don brace.  Reminded pt to don brace before standing and pt able to don brace independently.  Pt proceeded to try to fix blankets on bed, but continually broke back precautions despite max cueing from therapist to bend with knees rather than bend his back.      OT Diagnosis:    OT Problem List:   OT Treatment Interventions:     OT Goals ADL Goals Pt Will Perform Grooming: with supervision;Unsupported;Standing at sink ADL Goal: Grooming - Progress: Met Pt  Will Transfer to Toilet: with supervision;Ambulation;with DME;Comfort height toilet;Grab bars ADL Goal: Toilet Transfer - Progress: Met Miscellaneous OT Goals Miscellaneous OT Goal #2: Pt will be able to state and follow 3/3 back precautions OT Goal: Miscellaneous Goal #2 - Progress: Progressing toward goals  Visit Information  Last OT Received On: 09/20/12 Assistance Needed: +1    Subjective Data      Prior Functioning       Cognition  Overall Cognitive Status: Impaired Area of Impairment: Safety/judgement Arousal/Alertness: Awake/alert Orientation Level: Appears intact for tasks assessed Behavior During Session: [facility] for tasks performed Memory: Decreased recall of precautions Safety/Judgement: Decreased awareness of safety precautions;Impulsive Safety/Judgement - Other Comments: pt does not maintain back precautions despite OT cueing to avoid bending/arching/twisting    Mobility  Shoulder Instructions Bed Mobility Bed Mobility: Not assessed Details for Bed Mobility Assistance: pt sitting EOB upon OT arrival Transfers Transfers: Sit to Stand;Stand to Sit Sit to Stand: 5: Supervision;From bed Stand to Sit: 5: Supervision;To chair/3-in-1 Details for Transfer Assistance: supervision for safety       Exercises      Balance     End of Session OT - End of Session Equipment Utilized During Treatment: Back brace Activity Tolerance: Patient tolerated treatment well Patient left: in chair Nurse Communication: Mobility status;Patient requests pain meds  GO  09/20/2012 Cipriano Mile OTR/L Pager [phone] Office [phone]   Cipriano Mile 09/20/2012, 2:39 PM

## 2012-09-20 NOTE — Progress Notes (Signed)
RT in to see pt. per Dr. Susa Griffins consult for fever/rales with smoking hx. Upon arrival found pt. sitting up in bed , 99% sat on room air, rr-18, b/l b.s. clear using I/S without difficulty currently c/o of some pain from recent back surgery, call light activated/staff made aware per pt. awaiting meds, stressed use of I/S with deep breath/cough, frequent position changes, call bell within reach.

## 2012-09-20 NOTE — Progress Notes (Signed)
Physical Therapy Treatment Patient Details Name: Bradley Espinoza MRN: [id number] DOB: 03-06-1956 Today's Date: 09/20/2012 Time: 4098-1191 PT Time Calculation (min): 23 min  PT Assessment / Plan / Recommendation Comments on Treatment Session  Pt moving well, at a supervision level for all mobility, although still requires cueing for safe techniques throughout the session to maintain back precautions.     Follow Up Recommendations  Supervision for mobility/OOB;No PT follow up     Does the patient have the potential to tolerate intense rehabilitation     Barriers to Discharge        Equipment Recommendations  None recommended by PT    Recommendations for Other Services    Frequency Min 5X/week   Plan Discharge plan remains appropriate;Frequency remains appropriate    Precautions / Restrictions Precautions Precautions: Back Precaution Comments: Pt inconsistent with following back precautions. Able to verbalize Required Braces or Orthoses: Spinal Brace Spinal Brace: Lumbar corset;Applied in sitting position Restrictions Weight Bearing Restrictions: No   Pertinent Vitals/Pain Pt c/o headache 8/10. Rn gave pain meds during session.     Mobility  Bed Mobility Bed Mobility: Rolling Left;Left Sidelying to Sit;Sit to Supine Rolling Left: 5: Supervision Left Sidelying to Sit: 5: Supervision Sitting - Scoot to Edge of Bed: 5: Supervision Sit to Supine: 5: Supervision Details for Bed Mobility Assistance: VC to maintain back precautions as pt not getting in/out of bed safely Transfers Transfers: Sit to Stand;Stand to Sit Sit to Stand: 5: Supervision;With upper extremity assist;From bed Stand to Sit: 5: Supervision;With upper extremity assist;To bed Details for Transfer Assistance: VC for safe hand placement and pt reaching for the RW Ambulation/Gait Ambulation/Gait Assistance: 5: Supervision Ambulation Distance (Feet): 200 Feet Assistive device: Rolling  walker Ambulation/Gait Assistance Details: Pt requiring mod cues throughout ambulation secondary to pt twisting and bending.  Gait Pattern: Narrow base of support;Trunk flexed;Scissoring;Decreased stride length;Right flexed knee in stance;Left flexed knee in stance Gait velocity: decreased gait speed    Exercises     PT Diagnosis:    PT Problem List:   PT Treatment Interventions:     PT Goals Acute Rehab PT Goals PT Goal: Supine/Side to Sit - Progress: Progressing toward goal PT Goal: Sit to Supine/Side - Progress: Progressing toward goal PT Goal: Sit to Stand - Progress: Progressing toward goal PT Goal: Stand to Sit - Progress: Progressing toward goal PT Transfer Goal: Bed to Chair/Chair to Bed - Progress: Progressing toward goal PT Goal: Ambulate - Progress: Progressing toward goal PT Goal: Up/Down Stairs - Progress: Progressing toward goal Additional Goals PT Goal: Additional Goal #1 - Progress: Progressing toward goal  Visit Information  Last PT Received On: 09/20/12 Assistance Needed: +1    Subjective Data      Cognition  Overall Cognitive Status: Appears within functional limits for tasks assessed/performed Arousal/Alertness: Awake/alert Orientation Level: Appears intact for tasks assessed Behavior During Session: [facility] for tasks performed Memory: Decreased recall of precautions    Balance     End of Session PT - End of Session Equipment Utilized During Treatment: Gait belt;Back brace Activity Tolerance: Patient tolerated treatment well Patient left: in bed;with call bell/phone within reach;Other (comment) Nurse Communication: Mobility status   GP     Milana Kidney 09/20/2012, 1:32 PM

## 2012-09-21 MED ORDER — OXYCODONE HCL 5 MG PO TABS
20.0000 mg | ORAL_TABLET | ORAL | Status: DC | PRN
  Administered 2012-09-21 – 2012-09-22 (×5): 20 mg via ORAL
  Filled 2012-09-21 (×6): qty 4

## 2012-09-21 MED ORDER — MAGNESIUM CITRATE PO SOLN
1.0000 | Freq: Once | ORAL | Status: AC
  Administered 2012-09-21: 1 via ORAL
  Filled 2012-09-21: qty 296

## 2012-09-21 NOTE — Progress Notes (Signed)
Spoke with Dr. Venetia Maxon regarding elevated temp. Temp 102.3 at 2138, medicated with Tylenol at 2156, temp rechecked at 2345 99.0. Dr Rush Farmer says if patient spikes another temp please do blood cultures x2, UA, CBC, chest xray. Encouraged to use incentive spirometer

## 2012-09-21 NOTE — Progress Notes (Signed)
Subjective: Patient reports still constipated  Objective: Vital signs in last 24 hours: Temp:  [98.5 F (36.9 C)-102.3 F (39.1 C)] 99.1 F (37.3 C) (12/15 0600) Pulse Rate:  [72-98] 86  (12/15 0600) Resp:  [16-20] 20  (12/15 0600) BP: (102-146)/(50-72) 146/63 mmHg (12/15 0600) SpO2:  [96 %-98 %] 96 % (12/15 0600)  Intake/Output from previous day: 12/14 0701 - 12/15 0700 In: 730 [P.O.:730] Out: 480 [Urine:450; Drains:30] Intake/Output this shift:    Physical Exam: Dressing CDI.  Having some neck and right leg pain.  Lab Results: No results found for this basename: WBC:2,HGB:2,HCT:2,PLT:2 in the last 72 hours BMET No results found for this basename: NA:2,K:2,CL:2,CO2:2,GLUCOSE:2,BUN:2,CREATININE:2,CALCIUM:2 in the last 72 hours  Studies/Results: No results found.  Assessment/Plan: Continue to work on bowels.  Will continue to mobilize.  Says he is not getting enough for pain.    LOS: 4 days    Dorian Heckle, MD 09/21/2012, 8:07 AM

## 2012-09-22 LAB — CBC WITH DIFFERENTIAL/PLATELET
Basophils Relative: 0 % (ref 0–1)
Eosinophils Absolute: 0.1 10*3/uL (ref 0.0–0.7)
Hemoglobin: 10.1 g/dL — ABNORMAL LOW (ref 13.0–17.0)
Lymphs Abs: 1.1 10*3/uL (ref 0.7–4.0)
MCH: 30.5 pg (ref 26.0–34.0)
MCHC: 33 g/dL (ref 30.0–36.0)
Monocytes Relative: 9 % (ref 3–12)
Neutro Abs: 5.7 10*3/uL (ref 1.7–7.7)
Neutrophils Relative %: 75 % (ref 43–77)
Platelets: 175 10*3/uL (ref 150–400)
RBC: 3.31 MIL/uL — ABNORMAL LOW (ref 4.22–5.81)

## 2012-09-22 MED ORDER — METHOCARBAMOL 500 MG PO TABS
500.0000 mg | ORAL_TABLET | Freq: Four times a day (QID) | ORAL | Status: DC | PRN
  Administered 2012-09-22: 500 mg via ORAL
  Filled 2012-09-22: qty 1

## 2012-09-22 MED ORDER — HYDROMORPHONE HCL 2 MG PO TABS
4.0000 mg | ORAL_TABLET | ORAL | Status: DC | PRN
  Administered 2012-09-22 (×2): 4 mg via ORAL
  Filled 2012-09-22 (×2): qty 2

## 2012-09-22 NOTE — Progress Notes (Signed)
Pt is persistantly refusing bed alarm. Pt is alert and oriented x4; states he will call when getting out of bed. Pt is still very weak when using walker and has significant amount of pain when ambulating. Will continue to monitor pt, but bed alarm is not on per pt's request. Unit policy has been explained to the pt and he is aware that bed alarms are used to ensure pt safety.  Salvadore Oxford, RN 09/22/12 [phone]

## 2012-09-22 NOTE — Progress Notes (Signed)
Physical Therapy Treatment Patient Details Name: Bradley Espinoza MRN: [id number] DOB: September 11, 1956 Today's Date: 09/22/2012 Time: 4098-1191 PT Time Calculation (min): 29 min  PT Assessment / Plan / Recommendation Comments on Treatment Session  Pt requiring closer guarding for mobility today as compared to last PT session.  Pt reports pain as 9/10 in back & L LE.  Pt also cont's to require cueing to be compliant with back precautions with functional mobility.  This clinician feels pt would benefit from HHPT to ensure safety within home environment- D/C plans updated.        Follow Up Recommendations  Home health PT;Supervision for mobility/OOB     Does the patient have the potential to tolerate intense rehabilitation     Barriers to Discharge        Equipment Recommendations  None recommended by PT    Recommendations for Other Services    Frequency Min 5X/week   Plan Discharge plan needs to be updated    Precautions / Restrictions Precautions Precautions: Back Precaution Comments: Pt able to verbalize 3/3 back precautions but requires cueing throughout session to reinforce precautions with mobility.   Required Braces or Orthoses: Spinal Brace Spinal Brace: Lumbar corset;Applied in sitting position   Pertinent Vitals/Pain 9/10  Back & Lt leg.  Repositioned.  RN notified.      Mobility  Bed Mobility Bed Mobility: Sit to Sidelying Left;Left Sidelying to Sit;Sitting - Scoot to Edge of Bed Left Sidelying to Sit: 5: Supervision;HOB flat Sitting - Scoot to Edge of Bed: 5: Supervision Sit to Sidelying Left: 5: Supervision;HOB flat Details for Bed Mobility Assistance: Cues to reinforce back precautions (no twisting) Transfers Transfers: Sit to Stand;Stand to Sit Sit to Stand: 4: Min guard;With upper extremity assist;From bed;From toilet Stand to Sit: 4: Min guard;With upper extremity assist;To toilet;To bed Details for Transfer Assistance: Cues for safest hand placemetn & avoid  increased bending.   Ambulation/Gait Ambulation/Gait Assistance: 4: Min guard Ambulation Distance (Feet): 200 Feet Assistive device: Rolling walker Ambulation/Gait Assistance Details: Cues for posture & safe use of RW (pt bumping running into wall & doorframe).  Close guarding for safety due to pt reporting pain as 9/10 in back & L LE.   Gait Pattern: Step-through pattern;Trunk flexed;Narrow base of support Stairs: Yes Stairs Assistance: 4: Min assist Stairs Assistance Details (indicate cue type and reason): (A) for stablity & safety.  Pt used rail on Lt side + Rt HHA.  Pt c/o shooting pain in Lt leg.   Stair Management Technique: One rail Left;Other (comment);Step to pattern;Forwards (HHA Rt ) Number of Stairs: 5  Wheelchair Mobility Wheelchair Mobility: No      PT Goals Acute Rehab PT Goals Time For Goal Achievement: 09/25/12 Potential to Achieve Goals: Good Pt will go Supine/Side to Sit: with modified independence PT Goal: Supine/Side to Sit - Progress: Not met Pt will go Sit to Supine/Side: with modified independence PT Goal: Sit to Supine/Side - Progress: Not met Pt will go Sit to Stand: with modified independence PT Goal: Sit to Stand - Progress: Not met Pt will go Stand to Sit: with modified independence PT Goal: Stand to Sit - Progress: Not met Pt will Transfer Bed to Chair/Chair to Bed: with modified independence Pt will Ambulate: >150 feet;with modified independence;with least restrictive assistive device PT Goal: Ambulate - Progress: Not met Pt will Go Up / Down Stairs: Flight;with supervision;with rail(s) PT Goal: Up/Down Stairs - Progress: Not met Additional Goals Additional Goal #1: Pt will be able  to verbalize and maintain 3/3 back precautions during all mobility PT Goal: Additional Goal #1 - Progress: Not met  Visit Information  Last PT Received On: 09/22/12 Assistance Needed: +1    Subjective Data      Cognition  Area of Impairment:  Safety/judgement Arousal/Alertness: Awake/alert Orientation Level: Appears intact for tasks assessed Behavior During Session: [facility] for tasks performed Memory: Decreased recall of precautions Safety/Judgement: Decreased awareness of safety precautions Safety/Judgement - Other Comments: Pt cont's to require cueing to avoid bending & twisting.      Balance     End of Session PT - End of Session Equipment Utilized During Treatment: Gait belt;Back brace Activity Tolerance: Patient tolerated treatment well Patient left: in bed;with call bell/phone within reach Nurse Communication: Mobility status     Verdell Face, [place] [phone] 09/22/2012

## 2012-09-22 NOTE — Discharge Summary (Signed)
Physician Discharge Summary  Patient ID: Bradley Espinoza MRN: [id number] DOB/AGE: 57-Jan-1957 57 y.o.  Admit date: 09/17/2012 Discharge date: 09/22/2012  Admission Diagnoses:l4-5 spondylolisthesis  Discharge Diagnoses: same   Discharged Condition: ambulating. Wound dry  Hospital Course: surgery   Significant Diagnostic Studies:myelogram  Treatments: lumbar fusion  Discharge Exam: Blood pressure 136/84, pulse 70, temperature 97.6 F (36.4 C), temperature source Oral, resp. rate 18, height 6' (1.829 m), weight 78.971 kg (174 lb 1.6 oz), SpO2 97.00%. Ambulating, wound dry. Cbc nl.  Disposition: home on dilaudid and flexeril     Medication List     As of 09/22/2012  2:32 PM    ASK your doctor about these medications         diazepam 5 MG tablet   Commonly known as: VALIUM   Take 5 mg by mouth every 6 (six) hours as needed. For muscle pain      gabapentin 400 MG capsule   Commonly known as: NEURONTIN   Take 400 mg by mouth 3 (three) times daily.      oxyCODONE-acetaminophen 10-325 MG per tablet   Commonly known as: PERCOCET   Take 1 tablet by mouth every 4 (four) hours as needed. For pain         Signed: Karn Cassis 09/22/2012, 2:32 PM

## 2012-09-22 NOTE — Progress Notes (Signed)
     CARE MANAGEMENT NOTE 09/22/2012  Patient:  Bradley Espinoza, Bradley Espinoza   Account Number:  [id number]  Date Initiated:  09/18/2012  Documentation initiated by:  [facility]  Subjective/Objective Assessment:   Admitted postop     Action/Plan:   PT/OT evals- recs hhpt, 3n1 and tub bench.   Anticipated DC Date:  09/20/2012   Anticipated DC Plan:  HOME W HOME HEALTH SERVICES      DC Planning Services  CM consult      [facility] Choice  HOME HEALTH   Choice offered to / List presented to:  C-1 Patient        HH arranged  HH - 11 Patient Refused      Status of service:  Completed, signed off Medicare Important Message given?   (If response is "NO", the following Medicare IM given date fields will be blank) Date Medicare IM given:   Date Additional Medicare IM given:    Discharge Disposition:  HOME/SELF CARE  Per UR Regulation:  Reviewed for med. necessity/level of care/duration of stay  If discussed at Long Length of Stay Meetings, dates discussed:    Comments:  09/22/2012 1500 NCM spoke to pt and states he could not affore the copay at this time. He has the exercises that was provided to him from PT when he had his last surgery. NCM if he decides on [facility] PT or outpt PT he can follow up with his PCP. PCP can order HH from the office. Isidoro Donning RN CCM Case Mgmt phone [phone]  09/19/12 11:35 Letha Cape RN, BSN [phone] patient lives with spouse, patient has medication coverage and transportation at dc.  Patient states he has a 3 n 1 at home and he has a shower chair, so he really does not need any more DME.  Patient also states if he has to pay a percentage for hhpt he does not want hhpt, I informed patient that I wil do a benefit check on this to see how much his insurance will pay. If they pay 100% he would like to work with [facility] agency.  Per benefits check hhpt will pay at 100% after $30 co pay per visit. Patient states he does not need hhpt , he was walking  with therapist at the time and she also states he will not need hhpt.

## 2012-09-22 NOTE — Progress Notes (Signed)
Patient ID: Bradley Espinoza, male   DOB: 1956/04/26, 56 y.o.   MRN: [id number] Low grade fever, headache, some swelling in lumbar area. No drainage     Poor physical activity. See orders

## 2012-10-02 ENCOUNTER — Emergency Department (HOSPITAL_COMMUNITY)
Admission: EM | Admit: 2012-10-02 | Discharge: 2012-10-02 | Disposition: A | Discharge status: Home / Self Care | Payer: 59 | Attending: Emergency Medicine | Admitting: Emergency Medicine

## 2012-10-02 ENCOUNTER — Encounter (HOSPITAL_COMMUNITY): Payer: Self-pay

## 2012-10-02 DIAGNOSIS — L259 Unspecified contact dermatitis, unspecified cause: Secondary | ICD-10-CM | POA: Insufficient documentation

## 2012-10-02 DIAGNOSIS — Z8659 Personal history of other mental and behavioral disorders: Secondary | ICD-10-CM | POA: Insufficient documentation

## 2012-10-02 DIAGNOSIS — L309 Dermatitis, unspecified: Secondary | ICD-10-CM

## 2012-10-02 DIAGNOSIS — F329 Major depressive disorder, single episode, unspecified: Secondary | ICD-10-CM | POA: Insufficient documentation

## 2012-10-02 DIAGNOSIS — F3289 Other specified depressive episodes: Secondary | ICD-10-CM | POA: Insufficient documentation

## 2012-10-02 DIAGNOSIS — L299 Pruritus, unspecified: Secondary | ICD-10-CM | POA: Insufficient documentation

## 2012-10-02 DIAGNOSIS — F411 Generalized anxiety disorder: Secondary | ICD-10-CM | POA: Insufficient documentation

## 2012-10-02 DIAGNOSIS — Z79899 Other long term (current) drug therapy: Secondary | ICD-10-CM | POA: Insufficient documentation

## 2012-10-02 DIAGNOSIS — F17200 Nicotine dependence, unspecified, uncomplicated: Secondary | ICD-10-CM | POA: Insufficient documentation

## 2012-10-02 DIAGNOSIS — Z8739 Personal history of other diseases of the musculoskeletal system and connective tissue: Secondary | ICD-10-CM | POA: Insufficient documentation

## 2012-10-02 MED ORDER — FAMOTIDINE IN NACL 20-0.9 MG/50ML-% IV SOLN
20.0000 mg | Freq: Once | INTRAVENOUS | Status: AC
  Administered 2012-10-02: 20 mg via INTRAVENOUS
  Filled 2012-10-02: qty 50

## 2012-10-02 MED ORDER — PREDNISONE 10 MG PO TABS: 20.0000 mg | ORAL_TABLET | Freq: Every day | ORAL | Status: DC

## 2012-10-02 MED ORDER — LORAZEPAM 2 MG/ML IJ SOLN
0.5000 mg | Freq: Once | INTRAMUSCULAR | Status: AC
  Administered 2012-10-02: 0.5 mg via INTRAVENOUS
  Filled 2012-10-02: qty 1

## 2012-10-02 MED ORDER — ONDANSETRON HCL 4 MG/2ML IJ SOLN
4.0000 mg | Freq: Once | INTRAMUSCULAR | Status: AC
  Administered 2012-10-02: 4 mg via INTRAVENOUS
  Filled 2012-10-02: qty 2

## 2012-10-02 MED ORDER — DIPHENHYDRAMINE HCL 50 MG/ML IJ SOLN
25.0000 mg | Freq: Once | INTRAMUSCULAR | Status: AC
  Administered 2012-10-02: 25 mg via INTRAVENOUS
  Filled 2012-10-02: qty 1

## 2012-10-02 MED ORDER — FAMOTIDINE 20 MG PO TABS: 20.0000 mg | ORAL_TABLET | Freq: Two times a day (BID) | ORAL | Status: DC

## 2012-10-02 MED ORDER — METHYLPREDNISOLONE SODIUM SUCC 125 MG IJ SOLR
125.0000 mg | Freq: Once | INTRAMUSCULAR | Status: AC
  Administered 2012-10-02: 125 mg via INTRAVENOUS
  Filled 2012-10-02: qty 2

## 2012-10-02 NOTE — ED Notes (Signed)
Pt states he took 2 benadryl at about 12 noon yesterday.  Pt with obvious itching and small whelts to upper arms

## 2012-10-02 NOTE — ED Notes (Signed)
Pt states he is "broke out"  And itching since Christmas eve,  States the itching has "kept me up all night"

## 2012-10-02 NOTE — ED Provider Notes (Signed)
History    This chart was scribed for Benny Lennert, MD, MD by Smitty Pluck, ED Scribe. The patient was seen in room APA04 and the patient's care was started at 7:15AM.   CSN: [id number]  Arrival date & time 10/02/12  4098      Chief Complaint  Patient presents with  . Allergic Reaction    (Consider location/radiation/quality/duration/timing/severity/associated sxs/prior treatment) Patient is a 56 y.o. male presenting with allergic reaction. The history is provided by the patient. No language interpreter was used.  Allergic Reaction The primary symptoms are  rash. The primary symptoms do not include cough, abdominal pain or diarrhea. The current episode started 2 days ago. The problem has not changed since onset.This is a new problem.  The rash began 2 to 7 days ago. The rash appears on the chest, abdomen, left leg and right leg. The pain associated with the rash is moderate. The rash is associated with itching.  Significant symptoms also include itching.   Bradley Espinoza is a 56 y.o. male who presents to the Emergency Department complaining of constant, moderate itching diffuse rash onset 2 days ago. Pt denies new medications, soaps and laundry detergents. He states he has taken Benadryl without relief.   Past Medical History  Diagnosis Date  . Anxiety   . Depression   . Headache   . Arthritis     Past Surgical History  Procedure Date  . Back surgery 2004  . Cervical spine surgery 2008    No family history on file.  History  Substance Use Topics  . Smoking status: Current Every Day Smoker -- 1.0 packs/day    Types: Cigarettes    Start date: 10/08/1976  . Smokeless tobacco: Not on file  . Alcohol Use: No      Review of Systems  Constitutional: Negative for fatigue.  HENT: Negative for congestion, sinus pressure and ear discharge.   Eyes: Negative for discharge.  Respiratory: Negative for cough.   Cardiovascular: Negative for chest pain.   Gastrointestinal: Negative for abdominal pain and diarrhea.  Genitourinary: Negative for frequency and hematuria.  Musculoskeletal: Negative for back pain.  Skin: Positive for itching and rash.  Neurological: Negative for seizures and headaches.  Hematological: Negative.   Psychiatric/Behavioral: Negative for hallucinations.  All other systems reviewed and are negative.    Allergies  Review of patient's allergies indicates no known allergies.  Home Medications   Current Outpatient Rx  Name  Route  Sig  Dispense  Refill  . DIAZEPAM 5 MG PO TABS   Oral   Take 5 mg by mouth every 6 (six) hours as needed. For muscle pain         . GABAPENTIN 400 MG PO CAPS   Oral   Take 400 mg by mouth 3 (three) times daily.         . OXYCODONE HCL 5 MG PO TABS   Oral   Take 5 mg by mouth every 4 (four) hours as needed.         . OXYCODONE-ACETAMINOPHEN 10-325 MG PO TABS   Oral   Take 1 tablet by mouth every 4 (four) hours as needed. For pain           BP 168/101  Pulse 119  Temp 97.8 F (36.6 C) (Oral)  Resp 20  Ht 6' (1.829 m)  Wt 175 lb (79.379 kg)  BMI 23.73 kg/m2  SpO2 100%  Physical Exam  Nursing note and vitals reviewed. Constitutional: He  is oriented to person, place, and time. He appears well-developed.  HENT:  Head: Normocephalic and atraumatic.  Eyes: Conjunctivae normal and EOM are normal. No scleral icterus.  Neck: Neck supple. No thyromegaly present.  Cardiovascular: Normal rate and regular rhythm.  Exam reveals no gallop and no friction rub.   No murmur heard. Pulmonary/Chest: No stridor. He has no wheezes. He has no rales. He exhibits no tenderness.  Abdominal: He exhibits no distension. There is no tenderness. There is no rebound.  Musculoskeletal: Normal range of motion. He exhibits no edema.  Lymphadenopathy:    He has no cervical adenopathy.  Neurological: He is oriented to person, place, and time. Coordination normal.  Skin: Skin is warm and dry.        Fine macular, papular rash on chest, abdomen and legs.   Psychiatric: He has a normal mood and affect. His behavior is normal.    ED Course  Procedures (including critical care time) DIAGNOSTIC STUDIES: Oxygen Saturation is 100% on room air, normal by my interpretation.    COORDINATION OF CARE: 7:19 AM Discussed ED treatment with pt  7:34 AM Ordered:   Medications  oxyCODONE (OXY IR/ROXICODONE) 5 MG immediate release tablet (not administered)  diphenhydrAMINE (BENADRYL) injection 25 mg (25 mg Intravenous Given 10/02/12 0733)  methylPREDNISolone sodium succinate (SOLU-MEDROL) 125 mg/2 mL injection 125 mg (125 mg Intravenous Given 10/02/12 0733)  famotidine (PEPCID) IVPB 20 mg (20 mg Intravenous New Bag/Given 10/02/12 0733)  LORazepam (ATIVAN) injection 0.5 mg (0.5 mg Intravenous Given 10/02/12 0733)  ondansetron (ZOFRAN) injection 4 mg (4 mg Intravenous Given 10/02/12 0733)       Labs Reviewed - No data to display No results found.   No diagnosis found.    MDM        The chart was scribed for me under my direct supervision.  I personally performed the history, physical, and medical decision making and all procedures in the evaluation of this patient.Benny Lennert, MD 10/02/12 0900

## 2012-10-06 NOTE — Progress Notes (Signed)
Physical Therapy Treatment Patient Details Name: Bradley Espinoza MRN: [id number] DOB: 1955/10/12 Today's Date: 09/22/2012 Time: 4098-1191 PT Time Calculation (min): 29 min  PT Assessment / Plan / Recommendation Comments on Treatment Session  Pt requiring closer guarding for mobility today as compared to last PT session.  Pt reports pain as 9/10 in back & L LE.  Pt also cont's to require cueing to be compliant with back precautions with functional mobility.  This clinician feels pt would benefit from HHPT to ensure safety within home environment- D/C plans updated.        Follow Up Recommendations  Home health PT;Supervision for mobility/OOB     Does the patient have the potential to tolerate intense rehabilitation     Barriers to Discharge        Equipment Recommendations  None recommended by PT    Recommendations for Other Services    Frequency Min 5X/week   Plan Discharge plan needs to be updated    Precautions / Restrictions Precautions Precautions: Back Precaution Comments: Pt able to verbalize 3/3 back precautions but requires cueing throughout session to reinforce precautions with mobility.   Required Braces or Orthoses: Spinal Brace Spinal Brace: Lumbar corset;Applied in sitting position   Pertinent Vitals/Pain 9/10  Back & Lt leg.  Repositioned.  RN notified.      Mobility  Bed Mobility Bed Mobility: Sit to Sidelying Left;Left Sidelying to Sit;Sitting - Scoot to Edge of Bed Left Sidelying to Sit: 5: Supervision;HOB flat Sitting - Scoot to Edge of Bed: 5: Supervision Sit to Sidelying Left: 5: Supervision;HOB flat Details for Bed Mobility Assistance: Cues to reinforce back precautions (no twisting) Transfers Transfers: Sit to Stand;Stand to Sit Sit to Stand: 4: Min guard;With upper extremity assist;From bed;From toilet Stand to Sit: 4: Min guard;With upper extremity assist;To toilet;To bed Details for Transfer Assistance: Cues for safest hand placemetn & avoid  increased bending.   Ambulation/Gait Ambulation/Gait Assistance: 4: Min guard Ambulation Distance (Feet): 200 Feet Assistive device: Rolling walker Ambulation/Gait Assistance Details: Cues for posture & safe use of RW (pt bumping running into wall & doorframe).  Close guarding for safety due to pt reporting pain as 9/10 in back & L LE.   Gait Pattern: Step-through pattern;Trunk flexed;Narrow base of support Stairs: Yes Stairs Assistance: 4: Min assist Stairs Assistance Details (indicate cue type and reason): (A) for stablity & safety.  Pt used rail on Lt side + Rt HHA.  Pt c/o shooting pain in Lt leg.   Stair Management Technique: One rail Left;Other (comment);Step to pattern;Forwards (HHA Rt ) Number of Stairs: 5  Wheelchair Mobility Wheelchair Mobility: No      PT Goals Acute Rehab PT Goals Time For Goal Achievement: 09/25/12 Potential to Achieve Goals: Good Pt will go Supine/Side to Sit: with modified independence PT Goal: Supine/Side to Sit - Progress: Not met Pt will go Sit to Supine/Side: with modified independence PT Goal: Sit to Supine/Side - Progress: Not met Pt will go Sit to Stand: with modified independence PT Goal: Sit to Stand - Progress: Not met Pt will go Stand to Sit: with modified independence PT Goal: Stand to Sit - Progress: Not met Pt will Transfer Bed to Chair/Chair to Bed: with modified independence Pt will Ambulate: >150 feet;with modified independence;with least restrictive assistive device PT Goal: Ambulate - Progress: Not met Pt will Go Up / Down Stairs: Flight;with supervision;with rail(s) PT Goal: Up/Down Stairs - Progress: Not met Additional Goals Additional Goal #1: Pt will be able  to verbalize and maintain 3/3 back precautions during all mobility PT Goal: Additional Goal #1 - Progress: Not met  Visit Information  Last PT Received On: 09/22/12 Assistance Needed: +1    Subjective Data      Cognition  Area of Impairment:  Safety/judgement Arousal/Alertness: Awake/alert Orientation Level: Appears intact for tasks assessed Behavior During Session: [facility] for tasks performed Memory: Decreased recall of precautions Safety/Judgement: Decreased awareness of safety precautions Safety/Judgement - Other Comments: Pt cont's to require cueing to avoid bending & twisting.      Balance     End of Session PT - End of Session Equipment Utilized During Treatment: Gait belt;Back brace Activity Tolerance: Patient tolerated treatment well Patient left: in bed;with call bell/phone within reach Nurse Communication: Mobility status     Verdell Face, [place] [phone] 09/22/2012   Agree with above assessment.  Lewis Shock, PT, DPT Pager #: [phone] Office #: [phone]

## 2013-11-16 ENCOUNTER — Emergency Department (HOSPITAL_COMMUNITY): Payer: 59

## 2013-11-16 ENCOUNTER — Emergency Department (HOSPITAL_COMMUNITY)
Admission: EM | Admit: 2013-11-16 | Discharge: 2013-11-16 | Disposition: A | Discharge status: Home / Self Care | Payer: 59 | Attending: Emergency Medicine | Admitting: Emergency Medicine

## 2013-11-16 ENCOUNTER — Encounter (HOSPITAL_COMMUNITY): Payer: Self-pay | Admitting: Emergency Medicine

## 2013-11-16 DIAGNOSIS — IMO0002 Reserved for concepts with insufficient information to code with codable children: Secondary | ICD-10-CM | POA: Diagnosis present

## 2013-11-16 DIAGNOSIS — F17200 Nicotine dependence, unspecified, uncomplicated: Secondary | ICD-10-CM | POA: Diagnosis not present

## 2013-11-16 DIAGNOSIS — M129 Arthropathy, unspecified: Secondary | ICD-10-CM | POA: Insufficient documentation

## 2013-11-16 DIAGNOSIS — Y92410 Unspecified street and highway as the place of occurrence of the external cause: Secondary | ICD-10-CM | POA: Diagnosis not present

## 2013-11-16 DIAGNOSIS — G8929 Other chronic pain: Secondary | ICD-10-CM | POA: Insufficient documentation

## 2013-11-16 DIAGNOSIS — F3289 Other specified depressive episodes: Secondary | ICD-10-CM | POA: Diagnosis not present

## 2013-11-16 DIAGNOSIS — M5416 Radiculopathy, lumbar region: Secondary | ICD-10-CM

## 2013-11-16 DIAGNOSIS — Z79899 Other long term (current) drug therapy: Secondary | ICD-10-CM | POA: Insufficient documentation

## 2013-11-16 DIAGNOSIS — Y9389 Activity, other specified: Secondary | ICD-10-CM | POA: Diagnosis not present

## 2013-11-16 DIAGNOSIS — F411 Generalized anxiety disorder: Secondary | ICD-10-CM | POA: Diagnosis not present

## 2013-11-16 DIAGNOSIS — F329 Major depressive disorder, single episode, unspecified: Secondary | ICD-10-CM | POA: Insufficient documentation

## 2013-11-16 MED ORDER — DEXAMETHASONE SODIUM PHOSPHATE 10 MG/ML IJ SOLN
10.0000 mg | Freq: Once | INTRAMUSCULAR | Status: AC
  Administered 2013-11-16: 10 mg via INTRAMUSCULAR
  Filled 2013-11-16: qty 1

## 2013-11-16 MED ORDER — IBUPROFEN 600 MG PO TABS: 600.0000 mg | ORAL_TABLET | Freq: Four times a day (QID) | ORAL | Status: DC | PRN

## 2013-11-16 MED ORDER — KETOROLAC TROMETHAMINE 60 MG/2ML IM SOLN
60.0000 mg | Freq: Once | INTRAMUSCULAR | Status: AC
  Administered 2013-11-16: 60 mg via INTRAMUSCULAR
  Filled 2013-11-16: qty 2

## 2013-11-16 MED ORDER — PREDNISONE 10 MG PO TABS: ORAL_TABLET | ORAL | Status: DC

## 2013-11-16 NOTE — Discharge Instructions (Signed)
Lumbosacral Radiculopathy Lumbosacral radiculopathy is a pinched nerve or nerves in the low back (lumbosacral area). When this happens you may have weakness in your legs and may not be able to stand on your toes. You may have pain going down into your legs. There may be difficulties with walking normally. There are many causes of this problem. Sometimes this may happen from an injury, or simply from arthritis or boney problems. It may also be caused by other illnesses such as diabetes. If there is no improvement after treatment, further studies may be done to find the exact cause. DIAGNOSIS  X-rays may be needed if the problems become long standing. Electromyograms may be done. This study is one in which the working of nerves and muscles is studied. HOME CARE INSTRUCTIONS   Applications of ice packs may be helpful. Ice can be used in a plastic bag with a towel around it to prevent frostbite to skin. This may be used every 2 hours for 20 to 30 minutes, or as needed, while awake, or as directed by your caregiver.  Only take over-the-counter or prescription medicines for pain, discomfort, or fever as directed by your caregiver.  If physical therapy was prescribed, follow your caregiver's directions. SEEK IMMEDIATE MEDICAL CARE IF:   You have pain not controlled with medications.  You seem to be getting worse rather than better.  You develop increasing weakness in your legs.  You develop loss of bowel or bladder control.  You have difficulty with walking or balance, or develop clumsiness in the use of your legs.  You have a fever. MAKE SURE YOU:   Understand these instructions.  Will watch your condition.  Will get help right away if you are not doing well or get worse. Document Released: 09/24/2005 Document Revised: 12/17/2011 Document Reviewed: 05/14/2008 [facility] Patient Information 2014 [place].  Take your next dose of prednisone tomorrow evening.  You may use ibuprofen as  directed also, but take with food and stop this medicine if you develop nausea or heartburn. Avoid lifting,  Bending,  Twisting or any other activity that worsens your pain over the next week.  Apply a heating pad to your back for 20 minutes several times daily.
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
## 2013-11-19 NOTE — ED Provider Notes (Signed)
Medical screening examination/treatment/procedure(s) were performed by non-physician practitioner and as supervising physician I was immediately available for consultation/collaboration.  EKG Interpretation   None         Maudry Diego, MD 11/19/13 1434

## 2014-06-11 ENCOUNTER — Other Ambulatory Visit: Payer: Self-pay | Admitting: Neurosurgery

## 2014-06-11 DIAGNOSIS — M5412 Radiculopathy, cervical region: Secondary | ICD-10-CM

## 2014-06-16 ENCOUNTER — Ambulatory Visit
Admission: RE | Admit: 2014-06-16 | Discharge: 2014-06-16 | Disposition: A | Discharge status: Home / Self Care | Payer: 59 | Source: Ambulatory Visit | Attending: Neurosurgery | Admitting: Neurosurgery

## 2014-06-16 VITALS — BP 128/66 | HR 48

## 2014-06-16 DIAGNOSIS — M5412 Radiculopathy, cervical region: Secondary | ICD-10-CM

## 2014-06-16 MED ORDER — IOHEXOL 300 MG/ML  SOLN
10.0000 mL | Freq: Once | INTRAMUSCULAR | Status: AC | PRN
  Administered 2014-06-16: 10 mL via INTRATHECAL

## 2014-06-16 MED ORDER — DIAZEPAM 5 MG PO TABS
10.0000 mg | ORAL_TABLET | Freq: Once | ORAL | Status: AC
  Administered 2014-06-16: 10 mg via ORAL

## 2014-06-16 NOTE — Discharge Instructions (Signed)

## 2014-09-15 ENCOUNTER — Emergency Department (HOSPITAL_COMMUNITY): Payer: 59

## 2014-09-15 ENCOUNTER — Emergency Department (HOSPITAL_COMMUNITY): Payer: 59 | Admitting: Anesthesiology

## 2014-09-15 ENCOUNTER — Encounter (HOSPITAL_COMMUNITY): Payer: Self-pay

## 2014-09-15 ENCOUNTER — Inpatient Hospital Stay (HOSPITAL_COMMUNITY)
Admission: EM | Admit: 2014-09-15 | Discharge: 2014-09-22 | DRG: 501 | Disposition: A | Discharge status: Skilled Nursing Facility | Payer: 59 | Attending: Orthopedic Surgery | Admitting: Orthopedic Surgery

## 2014-09-15 ENCOUNTER — Encounter (HOSPITAL_COMMUNITY): Admission: EM | Disposition: A | Discharge status: Skilled Nursing Facility | Payer: Self-pay | Source: Home / Self Care

## 2014-09-15 DIAGNOSIS — S92001A Unspecified fracture of right calcaneus, initial encounter for closed fracture: Secondary | ICD-10-CM | POA: Diagnosis present

## 2014-09-15 DIAGNOSIS — Z7952 Long term (current) use of systemic steroids: Secondary | ICD-10-CM

## 2014-09-15 DIAGNOSIS — S32021A Stable burst fracture of second lumbar vertebra, initial encounter for closed fracture: Secondary | ICD-10-CM | POA: Diagnosis present

## 2014-09-15 DIAGNOSIS — W14XXXA Fall from tree, initial encounter: Secondary | ICD-10-CM

## 2014-09-15 DIAGNOSIS — W11XXXA Fall on and from ladder, initial encounter: Secondary | ICD-10-CM | POA: Diagnosis present

## 2014-09-15 DIAGNOSIS — S92001B Unspecified fracture of right calcaneus, initial encounter for open fracture: Principal | ICD-10-CM | POA: Diagnosis present

## 2014-09-15 DIAGNOSIS — F419 Anxiety disorder, unspecified: Secondary | ICD-10-CM | POA: Diagnosis present

## 2014-09-15 DIAGNOSIS — Z981 Arthrodesis status: Secondary | ICD-10-CM

## 2014-09-15 DIAGNOSIS — F329 Major depressive disorder, single episode, unspecified: Secondary | ICD-10-CM | POA: Diagnosis present

## 2014-09-15 DIAGNOSIS — S92002A Unspecified fracture of left calcaneus, initial encounter for closed fracture: Secondary | ICD-10-CM | POA: Diagnosis present

## 2014-09-15 DIAGNOSIS — G8929 Other chronic pain: Secondary | ICD-10-CM | POA: Diagnosis present

## 2014-09-15 DIAGNOSIS — T1490XA Injury, unspecified, initial encounter: Secondary | ICD-10-CM

## 2014-09-15 DIAGNOSIS — W102XXA Fall (on)(from) incline, initial encounter: Secondary | ICD-10-CM

## 2014-09-15 DIAGNOSIS — S8991XA Unspecified injury of right lower leg, initial encounter: Secondary | ICD-10-CM

## 2014-09-15 DIAGNOSIS — S92002B Unspecified fracture of left calcaneus, initial encounter for open fracture: Secondary | ICD-10-CM

## 2014-09-15 DIAGNOSIS — D62 Acute posthemorrhagic anemia: Secondary | ICD-10-CM | POA: Diagnosis present

## 2014-09-15 DIAGNOSIS — T1490 Injury, unspecified: Secondary | ICD-10-CM | POA: Diagnosis present

## 2014-09-15 DIAGNOSIS — S3992XA Unspecified injury of lower back, initial encounter: Secondary | ICD-10-CM

## 2014-09-15 DIAGNOSIS — S32029A Unspecified fracture of second lumbar vertebra, initial encounter for closed fracture: Secondary | ICD-10-CM

## 2014-09-15 DIAGNOSIS — F17210 Nicotine dependence, cigarettes, uncomplicated: Secondary | ICD-10-CM | POA: Diagnosis present

## 2014-09-15 HISTORY — PX: I&D EXTREMITY: SHX5045

## 2014-09-15 LAB — I-STAT CHEM 8, ED
BUN: 20 mg/dL (ref 6–23)
Calcium, Ion: 1.11 mmol/L — ABNORMAL LOW (ref 1.12–1.23)
Chloride: 104 mEq/L (ref 96–112)
Creatinine, Ser: 1 mg/dL (ref 0.50–1.35)
Glucose, Bld: 103 mg/dL — ABNORMAL HIGH (ref 70–99)
HEMATOCRIT: 46 % (ref 39.0–52.0)
HEMOGLOBIN: 15.6 g/dL (ref 13.0–17.0)
Potassium: 4.1 mEq/L (ref 3.7–5.3)
SODIUM: 141 meq/L (ref 137–147)
TCO2: 20 mmol/L (ref 0–100)

## 2014-09-15 LAB — COMPREHENSIVE METABOLIC PANEL
ALBUMIN: 4 g/dL (ref 3.5–5.2)
ALK PHOS: 80 U/L (ref 39–117)
ALT: 24 U/L (ref 0–53)
AST: 23 U/L (ref 0–37)
Anion gap: 13 (ref 5–15)
BILIRUBIN TOTAL: 0.7 mg/dL (ref 0.3–1.2)
BUN: 19 mg/dL (ref 6–23)
CHLORIDE: 104 meq/L (ref 96–112)
CO2: 24 meq/L (ref 19–32)
CREATININE: 0.94 mg/dL (ref 0.50–1.35)
Calcium: 9.2 mg/dL (ref 8.4–10.5)
GFR calc Af Amer: >90 mL/min (ref >90)
GFR, EST NON AFRICAN AMERICAN: 90 mL/min — AB (ref >90)
Glucose, Bld: 103 mg/dL — ABNORMAL HIGH (ref 70–99)
POTASSIUM: 4.3 meq/L (ref 3.7–5.3)
Sodium: 141 mEq/L (ref 137–147)
Total Protein: 7 g/dL (ref 6.0–8.3)

## 2014-09-15 LAB — CBC
HEMATOCRIT: 44.2 % (ref 39.0–52.0)
Hemoglobin: 14.9 g/dL (ref 13.0–17.0)
MCH: 31.5 pg (ref 26.0–34.0)
MCHC: 33.7 g/dL (ref 30.0–36.0)
MCV: 93.4 fL (ref 78.0–100.0)
PLATELETS: 173 10*3/uL (ref 150–400)
RBC: 4.73 MIL/uL (ref 4.22–5.81)
RDW: 12.5 % (ref 11.5–15.5)
WBC: 7.8 10*3/uL (ref 4.0–10.5)

## 2014-09-15 LAB — ETHANOL: Alcohol, Ethyl (B): <11 mg/dL (ref 0–11)

## 2014-09-15 LAB — PROTIME-INR
INR: 0.94 (ref 0.00–1.49)
PROTHROMBIN TIME: 12.7 s (ref 11.6–15.2)

## 2014-09-15 LAB — SAMPLE TO BLOOD BANK

## 2014-09-15 SURGERY — IRRIGATION AND DEBRIDEMENT EXTREMITY
Anesthesia: General | Laterality: Right

## 2014-09-15 MED ORDER — CEFAZOLIN SODIUM-DEXTROSE 2-3 GM-% IV SOLR
INTRAVENOUS | Status: AC
  Filled 2014-09-15: qty 50

## 2014-09-15 MED ORDER — OXYCODONE HCL 5 MG PO TABS
5.0000 mg | ORAL_TABLET | Freq: Once | ORAL | Status: AC | PRN
  Administered 2014-09-15: 5 mg via ORAL

## 2014-09-15 MED ORDER — FENTANYL CITRATE 0.05 MG/ML IJ SOLN
INTRAMUSCULAR | Status: AC
  Filled 2014-09-15: qty 5

## 2014-09-15 MED ORDER — OXYCODONE HCL 5 MG/5ML PO SOLN: 5.0000 mg | Freq: Once | ORAL | Status: AC | PRN

## 2014-09-15 MED ORDER — SUFENTANIL CITRATE 50 MCG/ML IV SOLN
INTRAVENOUS | Status: DC | PRN
  Administered 2014-09-15: 10 ug via INTRAVENOUS
  Administered 2014-09-15 (×2): 5 ug via INTRAVENOUS
  Administered 2014-09-15 (×2): 10 ug via INTRAVENOUS
  Administered 2014-09-15 (×2): 5 ug via INTRAVENOUS

## 2014-09-15 MED ORDER — PROMETHAZINE HCL 25 MG/ML IJ SOLN: 6.2500 mg | INTRAMUSCULAR | Status: DC | PRN

## 2014-09-15 MED ORDER — HYDROMORPHONE HCL 1 MG/ML IJ SOLN
INTRAMUSCULAR | Status: AC
  Filled 2014-09-15: qty 1

## 2014-09-15 MED ORDER — HYDROMORPHONE HCL 1 MG/ML IJ SOLN
INTRAMUSCULAR | Status: AC
  Administered 2014-09-15: 2 mg via INTRAVENOUS
  Filled 2014-09-15: qty 2

## 2014-09-15 MED ORDER — HYDROMORPHONE HCL 1 MG/ML IJ SOLN
0.2500 mg | INTRAMUSCULAR | Status: DC | PRN
  Administered 2014-09-15 (×4): 0.5 mg via INTRAVENOUS

## 2014-09-15 MED ORDER — LIDOCAINE HCL (CARDIAC) 20 MG/ML IV SOLN
INTRAVENOUS | Status: AC
  Filled 2014-09-15: qty 5

## 2014-09-15 MED ORDER — DIPHENHYDRAMINE HCL 12.5 MG/5ML PO ELIX
12.5000 mg | ORAL_SOLUTION | Freq: Four times a day (QID) | ORAL | Status: DC | PRN
  Filled 2014-09-15: qty 5

## 2014-09-15 MED ORDER — PROPOFOL 10 MG/ML IV BOLUS
INTRAVENOUS | Status: DC | PRN
  Administered 2014-09-15: 200 mg via INTRAVENOUS

## 2014-09-15 MED ORDER — CEFAZOLIN SODIUM-DEXTROSE 2-3 GM-% IV SOLR
INTRAVENOUS | Status: DC | PRN
  Administered 2014-09-15: 2 g via INTRAVENOUS

## 2014-09-15 MED ORDER — CEFAZOLIN SODIUM 1-5 GM-% IV SOLN
1.0000 g | Freq: Once | INTRAVENOUS | Status: AC
  Administered 2014-09-15: 1 g via INTRAVENOUS
  Filled 2014-09-15: qty 50

## 2014-09-15 MED ORDER — SUCCINYLCHOLINE CHLORIDE 20 MG/ML IJ SOLN
INTRAMUSCULAR | Status: DC | PRN
  Administered 2014-09-15: 100 mg via INTRAVENOUS

## 2014-09-15 MED ORDER — LACTATED RINGERS IV SOLN
INTRAVENOUS | Status: DC | PRN
  Administered 2014-09-15: 19:00:00 via INTRAVENOUS

## 2014-09-15 MED ORDER — LIDOCAINE HCL (CARDIAC) 20 MG/ML IV SOLN
INTRAVENOUS | Status: DC | PRN
  Administered 2014-09-15: 30 mg via INTRAVENOUS

## 2014-09-15 MED ORDER — MIDAZOLAM HCL 2 MG/2ML IJ SOLN
0.5000 mg | Freq: Once | INTRAMUSCULAR | Status: AC | PRN
  Administered 2014-09-15: 1 mg via INTRAVENOUS

## 2014-09-15 MED ORDER — ONDANSETRON HCL 4 MG/2ML IJ SOLN
4.0000 mg | Freq: Once | INTRAMUSCULAR | Status: AC
  Administered 2014-09-15: 4 mg via INTRAVENOUS
  Filled 2014-09-15: qty 2

## 2014-09-15 MED ORDER — ONDANSETRON HCL 4 MG/2ML IJ SOLN
4.0000 mg | Freq: Four times a day (QID) | INTRAMUSCULAR | Status: DC | PRN
  Filled 2014-09-15: qty 2

## 2014-09-15 MED ORDER — HYDROMORPHONE HCL 1 MG/ML IJ SOLN
INTRAMUSCULAR | Status: AC
  Filled 2014-09-15: qty 2

## 2014-09-15 MED ORDER — MIDAZOLAM HCL 2 MG/2ML IJ SOLN
INTRAMUSCULAR | Status: AC
  Administered 2014-09-15: 1 mg
  Filled 2014-09-15: qty 2

## 2014-09-15 MED ORDER — SUCCINYLCHOLINE CHLORIDE 20 MG/ML IJ SOLN
INTRAMUSCULAR | Status: AC
  Filled 2014-09-15: qty 1

## 2014-09-15 MED ORDER — HYDROMORPHONE 0.3 MG/ML IV SOLN
INTRAVENOUS | Status: DC
  Administered 2014-09-15: 22:00:00 via INTRAVENOUS
  Administered 2014-09-16: 1.1 mg via INTRAVENOUS
  Administered 2014-09-16: 3.6 mg via INTRAVENOUS
  Administered 2014-09-16: 6.3 mg via INTRAVENOUS
  Administered 2014-09-16: 0.3 mg via INTRAVENOUS
  Administered 2014-09-16 (×2): via INTRAVENOUS
  Administered 2014-09-16: 2.1 mg via INTRAVENOUS
  Administered 2014-09-17: 3.6 mg via INTRAVENOUS
  Administered 2014-09-17: 3.77 mg via INTRAVENOUS
  Administered 2014-09-17: 3.31 mg via INTRAVENOUS
  Administered 2014-09-17 – 2014-09-18 (×3): via INTRAVENOUS
  Administered 2014-09-18: 4.8 mg via INTRAVENOUS
  Administered 2014-09-18: 17:00:00 via INTRAVENOUS
  Administered 2014-09-18: 2.34 mg via INTRAVENOUS
  Administered 2014-09-18 (×2): 1.5 mg via INTRAVENOUS
  Administered 2014-09-18: 3 mg via INTRAVENOUS
  Administered 2014-09-18: 1.8 mg via INTRAVENOUS
  Administered 2014-09-18: 0.9 mg via INTRAVENOUS
  Administered 2014-09-19: 2.7 mg via INTRAVENOUS
  Administered 2014-09-19: 3.3 mg via INTRAVENOUS
  Administered 2014-09-19: 1.18 mg via INTRAVENOUS
  Administered 2014-09-19: 3 mg via INTRAVENOUS
  Administered 2014-09-19: 0.3 mg via INTRAVENOUS
  Administered 2014-09-19 – 2014-09-20 (×2): via INTRAVENOUS
  Filled 2014-09-15 (×9): qty 25

## 2014-09-15 MED ORDER — FENTANYL CITRATE 0.05 MG/ML IJ SOLN
INTRAMUSCULAR | Status: DC | PRN
  Administered 2014-09-15: 50 ug via INTRAVENOUS
  Administered 2014-09-15 (×2): 100 ug via INTRAVENOUS

## 2014-09-15 MED ORDER — PROPOFOL 10 MG/ML IV BOLUS
INTRAVENOUS | Status: AC
  Filled 2014-09-15: qty 20

## 2014-09-15 MED ORDER — ONDANSETRON HCL 4 MG/2ML IJ SOLN
INTRAMUSCULAR | Status: DC | PRN
  Administered 2014-09-15: 4 mg via INTRAVENOUS

## 2014-09-15 MED ORDER — ONDANSETRON HCL 4 MG/2ML IJ SOLN
INTRAMUSCULAR | Status: AC
  Filled 2014-09-15: qty 2

## 2014-09-15 MED ORDER — DEXAMETHASONE SODIUM PHOSPHATE 4 MG/ML IJ SOLN
INTRAMUSCULAR | Status: AC
  Filled 2014-09-15: qty 1

## 2014-09-15 MED ORDER — ARTIFICIAL TEARS OP OINT
TOPICAL_OINTMENT | OPHTHALMIC | Status: DC | PRN
  Administered 2014-09-15: 1 via OPHTHALMIC

## 2014-09-15 MED ORDER — ARTIFICIAL TEARS OP OINT
TOPICAL_OINTMENT | OPHTHALMIC | Status: AC
  Filled 2014-09-15: qty 3.5

## 2014-09-15 MED ORDER — SODIUM CHLORIDE 0.9 % IJ SOLN: 9.0000 mL | INTRAMUSCULAR | Status: DC | PRN

## 2014-09-15 MED ORDER — HYDROMORPHONE HCL 1 MG/ML IJ SOLN
2.0000 mg | Freq: Once | INTRAMUSCULAR | Status: AC
  Administered 2014-09-15: 2 mg via INTRAVENOUS

## 2014-09-15 MED ORDER — FENTANYL CITRATE 0.05 MG/ML IJ SOLN
50.0000 ug | Freq: Once | INTRAMUSCULAR | Status: AC
  Administered 2014-09-15: 50 ug via INTRAVENOUS
  Filled 2014-09-15: qty 2

## 2014-09-15 MED ORDER — SODIUM CHLORIDE 0.9 % IV SOLN
Freq: Once | INTRAVENOUS | Status: AC
  Administered 2014-09-15: 15:00:00 via INTRAVENOUS

## 2014-09-15 MED ORDER — MEPERIDINE HCL 25 MG/ML IJ SOLN: 6.2500 mg | INTRAMUSCULAR | Status: DC | PRN

## 2014-09-15 MED ORDER — DIPHENHYDRAMINE HCL 50 MG/ML IJ SOLN
12.5000 mg | Freq: Four times a day (QID) | INTRAMUSCULAR | Status: DC | PRN
  Filled 2014-09-15: qty 0.25

## 2014-09-15 MED ORDER — OXYCODONE HCL 5 MG PO TABS
ORAL_TABLET | ORAL | Status: AC
  Filled 2014-09-15: qty 1

## 2014-09-15 MED ORDER — SUFENTANIL CITRATE 50 MCG/ML IV SOLN
INTRAVENOUS | Status: AC
  Filled 2014-09-15: qty 1

## 2014-09-15 MED ORDER — MIDAZOLAM HCL 5 MG/5ML IJ SOLN
INTRAMUSCULAR | Status: DC | PRN
  Administered 2014-09-15: 2 mg via INTRAVENOUS

## 2014-09-15 MED ORDER — NALOXONE HCL 0.4 MG/ML IJ SOLN
0.4000 mg | INTRAMUSCULAR | Status: DC | PRN
  Filled 2014-09-15: qty 1

## 2014-09-15 MED ORDER — ROCURONIUM BROMIDE 50 MG/5ML IV SOLN
INTRAVENOUS | Status: AC
  Filled 2014-09-15: qty 1

## 2014-09-15 MED ORDER — MIDAZOLAM HCL 2 MG/2ML IJ SOLN
INTRAMUSCULAR | Status: AC
  Filled 2014-09-15: qty 2

## 2014-09-15 MED ORDER — HYDROMORPHONE HCL 1 MG/ML IJ SOLN
1.0000 mg | Freq: Once | INTRAMUSCULAR | Status: AC
  Administered 2014-09-15: 1 mg via INTRAVENOUS
  Filled 2014-09-15: qty 1

## 2014-09-15 MED ORDER — ONDANSETRON HCL 4 MG/2ML IJ SOLN
4.0000 mg | Freq: Once | INTRAMUSCULAR | Status: AC
  Administered 2014-09-15: 4 mg via INTRAVENOUS

## 2014-09-15 MED ORDER — HYDROMORPHONE 0.3 MG/ML IV SOLN
INTRAVENOUS | Status: AC
  Filled 2014-09-15: qty 25

## 2014-09-15 MED ORDER — HYDROMORPHONE HCL 1 MG/ML IJ SOLN
2.0000 mg | Freq: Once | INTRAMUSCULAR | Status: AC
  Administered 2014-09-15: 2 mg via INTRAVENOUS
  Filled 2014-09-15: qty 2

## 2014-09-15 MED ORDER — HYDROMORPHONE HCL 1 MG/ML IJ SOLN
0.5000 mg | INTRAMUSCULAR | Status: AC | PRN
  Administered 2014-09-15 (×4): 0.5 mg via INTRAVENOUS

## 2014-09-15 MED ORDER — HYDROMORPHONE HCL 1 MG/ML IJ SOLN
2.0000 mg | Freq: Once | INTRAMUSCULAR | Status: AC
Start: 2014-09-15 — End: 2014-09-15
  Administered 2014-09-15: 2 mg via INTRAVENOUS

## 2014-09-15 MED ORDER — PHENYLEPHRINE 40 MCG/ML (10ML) SYRINGE FOR IV PUSH (FOR BLOOD PRESSURE SUPPORT)
PREFILLED_SYRINGE | INTRAVENOUS | Status: AC
  Filled 2014-09-15: qty 20

## 2014-09-15 MED ORDER — SODIUM CHLORIDE 0.9 % IV SOLN
INTRAVENOUS | Status: DC
  Administered 2014-09-16 – 2014-09-19 (×4): via INTRAVENOUS

## 2014-09-15 MED ORDER — SODIUM CHLORIDE 0.9 % IJ SOLN
INTRAMUSCULAR | Status: AC
  Filled 2014-09-15: qty 10

## 2014-09-15 MED ORDER — SODIUM CHLORIDE 0.9 % IR SOLN
Status: DC | PRN
  Administered 2014-09-15: 3000 mL

## 2014-09-15 SURGICAL SUPPLY — 48 items
BANDAGE ELASTIC 4 VELCRO ST LF (GAUZE/BANDAGES/DRESSINGS) ×3 IMPLANT
BANDAGE ELASTIC 6 VELCRO ST LF (GAUZE/BANDAGES/DRESSINGS) ×3 IMPLANT
BLADE SURG 10 STRL SS (BLADE) ×3 IMPLANT
BNDG COHESIVE 4X5 TAN STRL (GAUZE/BANDAGES/DRESSINGS) ×3 IMPLANT
BNDG GAUZE ELAST 4 BULKY (GAUZE/BANDAGES/DRESSINGS) ×3 IMPLANT
BOOTCOVER CLEANROOM LRG (PROTECTIVE WEAR) ×6 IMPLANT
COVER SURGICAL LIGHT HANDLE (MISCELLANEOUS) ×3 IMPLANT
CUFF TOURNIQUET SINGLE 34IN LL (TOURNIQUET CUFF) IMPLANT
DRSG ADAPTIC 3X8 NADH LF (GAUZE/BANDAGES/DRESSINGS) ×3 IMPLANT
DURAPREP 26ML APPLICATOR (WOUND CARE) ×3 IMPLANT
ELECT REM PT RETURN 9FT ADLT (ELECTROSURGICAL)
ELECTRODE REM PT RTRN 9FT ADLT (ELECTROSURGICAL) IMPLANT
EVACUATOR 1/8 PVC DRAIN (DRAIN) IMPLANT
FACESHIELD WRAPAROUND (MASK) ×9 IMPLANT
GAUZE SPONGE 4X4 12PLY STRL (GAUZE/BANDAGES/DRESSINGS) ×3 IMPLANT
GAUZE XEROFORM 1X8 LF (GAUZE/BANDAGES/DRESSINGS) ×3 IMPLANT
GLOVE BIO SURGEON STRL SZ8 (GLOVE) ×3 IMPLANT
GLOVE ORTHO TXT STRL SZ7.5 (GLOVE) ×6 IMPLANT
GOWN STRL REUS W/ TWL LRG LVL3 (GOWN DISPOSABLE) ×3 IMPLANT
GOWN STRL REUS W/TWL 2XL LVL3 (GOWN DISPOSABLE) ×3 IMPLANT
GOWN STRL REUS W/TWL LRG LVL3 (GOWN DISPOSABLE) ×6
HANDPIECE INTERPULSE COAX TIP (DISPOSABLE)
KIT BASIN OR (CUSTOM PROCEDURE TRAY) ×3 IMPLANT
KIT ROOM TURNOVER OR (KITS) ×3 IMPLANT
MANIFOLD NEPTUNE II (INSTRUMENTS) ×3 IMPLANT
NS IRRIG 1000ML POUR BTL (IV SOLUTION) ×3 IMPLANT
PACK ORTHO EXTREMITY (CUSTOM PROCEDURE TRAY) ×3 IMPLANT
PAD ARMBOARD 7.5X6 YLW CONV (MISCELLANEOUS) ×6 IMPLANT
PADDING CAST ABS 4INX4YD NS (CAST SUPPLIES) ×2
PADDING CAST ABS 6INX4YD NS (CAST SUPPLIES) ×2
PADDING CAST ABS COTTON 4X4 ST (CAST SUPPLIES) ×1 IMPLANT
PADDING CAST ABS COTTON 6X4 NS (CAST SUPPLIES) ×1 IMPLANT
PENCIL BUTTON HOLSTER BLD 10FT (ELECTRODE) IMPLANT
SET HNDPC FAN SPRY TIP SCT (DISPOSABLE) IMPLANT
SPLINT PLASTER CAST XFAST 5X30 (CAST SUPPLIES) ×1 IMPLANT
SPLINT PLASTER XFAST SET 5X30 (CAST SUPPLIES) ×2
SPONGE LAP 18X18 X RAY DECT (DISPOSABLE) ×3 IMPLANT
STOCKINETTE IMPERVIOUS 9X36 MD (GAUZE/BANDAGES/DRESSINGS) ×3 IMPLANT
SUT ETHILON 3 0 PS 1 (SUTURE) IMPLANT
TOWEL OR 17X24 6PK STRL BLUE (TOWEL DISPOSABLE) ×3 IMPLANT
TOWEL OR 17X26 10 PK STRL BLUE (TOWEL DISPOSABLE) ×3 IMPLANT
TOWEL OR NON WOVEN STRL DISP B (DISPOSABLE) ×3 IMPLANT
TUBE ANAEROBIC SPECIMEN COL (MISCELLANEOUS) IMPLANT
TUBE CONNECTING 12'X1/4 (SUCTIONS) ×1
TUBE CONNECTING 12X1/4 (SUCTIONS) ×2 IMPLANT
UNDERPAD 30X30 INCONTINENT (UNDERPADS AND DIAPERS) ×3 IMPLANT
WATER STERILE IRR 1000ML POUR (IV SOLUTION) ×3 IMPLANT
YANKAUER SUCT BULB TIP NO VENT (SUCTIONS) ×3 IMPLANT

## 2014-09-15 NOTE — H&P (Signed)
Bradley Espinoza Feb 28, 1956  [id number] Chief Complaint/Reason for Consult: fall HPI: This is a 58 yo white male who was trying to cut a limb in a tree today and fell off his ladder about 40 feet.  He landed on his feet and instantly had horrible back and bilateral ankle pain.  He was brought in by EMS as a level II.  He was found to have an open right calcaneal fracture as well as an L2 fracture.  His other scans are currently pending.  We have been asked to see him for admission.  ROS : Please see HPI, otherwise negative  Family History  Problem Relation Age of Onset  . Diabetes Father   . Cancer Other     Past Medical History  Diagnosis Date  . Anxiety   . Depression   . Headache(784.0)   . Arthritis     Past Surgical History  Procedure Laterality Date  . Back surgery  2004  . Cervical spine surgery  2008  . Inguinal hernia repair Bilateral     Social History:  reports that he has been smoking Cigarettes.  He started smoking about 37 years ago. He has a 10 pack-year smoking history. He has never used smokeless tobacco. He reports that he does not drink alcohol or use illicit drugs.  Allergies:  Allergies  Allergen Reactions  . Codeine Nausea And Vomiting     (Not in a hospital admission)  Blood pressure 158/77, pulse 77, temperature 98.1 F (36.7 C), temperature source Core (Comment), resp. rate 20, height '6\' 1"'  (1.854 m), weight 180 lb (81.647 kg), SpO2 98 %. Physical Exam: General: WD, WN white male who is moderate distress secondary to pain HEENT: head is normocephalic, atraumatic.  Sclera are noninjected.  PERRL.  Ears and nose without any masses or lesions.  Mouth is pink and moist Neck: in c-collar, but no tenderness to palpation. Heart: regular, rate, and rhythm.  Normal s1,s2. No obvious murmurs, gallops, or rubs noted.  Palpable radial and pedal pulses bilaterally Lungs: CTAB, no wheezes, rhonchi, or rales noted.  Respiratory effort nonlabored Abd: soft,  NT, ND, +BS, no masses, hernias, or organomegaly MS: obvious deformity of his left foot, but good pedal pulses, right foot is wrapped with an ace wrap currently, NVI.  Small abrasion to the left dorsum of his hand Skin: warm and dry with no masses, lesions, or rashes Neuro: NVI, normal sensation, with no deficits Psych: A&Ox3 with an appropriate affect.    Results for orders placed or performed during the hospital encounter of 09/15/14 (from the past 48 hour(s))  Comprehensive metabolic panel     Status: Abnormal   Collection Time: 09/15/14  1:15 PM  Result Value Ref Range   Sodium 141 137 - 147 mEq/L   Potassium 4.3 3.7 - 5.3 mEq/L   Chloride 104 96 - 112 mEq/L   CO2 24 19 - 32 mEq/L   Glucose, Bld 103 (H) 70 - 99 mg/dL   BUN 19 6 - 23 mg/dL   Creatinine, Ser 0.94 0.50 - 1.35 mg/dL   Calcium 9.2 8.4 - 10.5 mg/dL   Total Protein 7.0 6.0 - 8.3 g/dL   Albumin 4.0 3.5 - 5.2 g/dL   AST 23 0 - 37 U/L   ALT 24 0 - 53 U/L   Alkaline Phosphatase 80 39 - 117 U/L   Total Bilirubin 0.7 0.3 - 1.2 mg/dL   GFR calc non Af Amer 90 (L) >90 mL/min  GFR calc Af Amer >90 >90 mL/min    Comment: (NOTE) The eGFR has been calculated using the CKD EPI equation. This calculation has not been validated in all clinical situations. eGFR's persistently <90 mL/min signify possible Chronic Kidney Disease.    Anion gap 13 5 - 15  CBC     Status: None   Collection Time: 09/15/14  1:15 PM  Result Value Ref Range   WBC 7.8 4.0 - 10.5 K/uL   RBC 4.73 4.22 - 5.81 MIL/uL   Hemoglobin 14.9 13.0 - 17.0 g/dL   HCT 44.2 39.0 - 52.0 %   MCV 93.4 78.0 - 100.0 fL   MCH 31.5 26.0 - 34.0 pg   MCHC 33.7 30.0 - 36.0 g/dL   RDW 12.5 11.5 - 15.5 %   Platelets 173 150 - 400 K/uL  Ethanol     Status: None   Collection Time: 09/15/14  1:15 PM  Result Value Ref Range   Alcohol, Ethyl (B) <11 0 - 11 mg/dL    Comment:        LOWEST DETECTABLE LIMIT FOR SERUM ALCOHOL IS 11 mg/dL FOR MEDICAL PURPOSES ONLY    Protime-INR     Status: None   Collection Time: 09/15/14  1:15 PM  Result Value Ref Range   Prothrombin Time 12.7 11.6 - 15.2 seconds   INR 0.94 0.00 - 1.49  Sample to Blood Bank     Status: None   Collection Time: 09/15/14  1:18 PM  Result Value Ref Range   Blood Bank Specimen SAMPLE AVAILABLE FOR TESTING    Sample Expiration 09/16/2014   I-Stat Chem 8, ED     Status: Abnormal   Collection Time: 09/15/14  1:27 PM  Result Value Ref Range   Sodium 141 137 - 147 mEq/L   Potassium 4.1 3.7 - 5.3 mEq/L   Chloride 104 96 - 112 mEq/L   BUN 20 6 - 23 mg/dL   Creatinine, Ser 1.00 0.50 - 1.35 mg/dL   Glucose, Bld 103 (H) 70 - 99 mg/dL   Calcium, Ion 1.11 (L) 1.12 - 1.23 mmol/L   TCO2 20 0 - 100 mmol/L   Hemoglobin 15.6 13.0 - 17.0 g/dL   HCT 46.0 39.0 - 52.0 %   Dg Tibia/fibula Right  09/15/2014   CLINICAL DATA:  Right lower leg pain swelling after falling 40 feet.  EXAM: RIGHT TIBIA AND FIBULA - 2 VIEW  COMPARISON:  None.  FINDINGS: No significant abnormalities of the tibia or fibula. There is a severely comminuted fracture of the calcaneus. There is chondromalacia of the patella but there is no knee effusion.  IMPRESSION: Normal tibia and fibula. Severely comminuted fracture of the right calcaneus.   Electronically Signed   By: Rozetta Nunnery M.D.   On: 09/15/2014 14:00   Dg Pelvis Portable  09/15/2014   CLINICAL DATA:  58 year old male with history of trauma after falling 40 feet. Chest pain.  EXAM: PORTABLE PELVIS 1-2 VIEWS  COMPARISON:  No priors.  FINDINGS: AP view of the pelvis demonstrates no definite acute displaced fractures of the bony pelvic ring. Bilateral proximal femurs as visualized appear intact, the femoral heads appear located on this single view examination. Degenerative changes of osteoarthritis are noted in the hip joints bilaterally. Orthopedic fixation hardware in the lower lumbar spine is incompletely visualized.  IMPRESSION: 1. No evidence of significant acute traumatic  injury to the bony pelvis.   Electronically Signed   By: Mauri Brooklyn.D.  On: 09/15/2014 13:57   Dg Chest Port 1 View  09/15/2014   CLINICAL DATA:  Fall 40 feet.  Left chest pain.  EXAM: PORTABLE CHEST - 1 VIEW  COMPARISON:  05/28/2007  FINDINGS: No pneumothorax. Heart and mediastinal contours are within normal limits. No focal opacities or effusions. No acute bony abnormality.  IMPRESSION: No active disease.   Electronically Signed   By: Rolm Baptise M.D.   On: 09/15/2014 13:58   Dg Foot 2 Views Right  09/15/2014   CLINICAL DATA:  Right heel pain in laceration after fall of 40 feet.  EXAM: RIGHT FOOT - 2 VIEW  COMPARISON:  None.  FINDINGS: Severely displaced and comminuted fracture is seen involving the anterior and middle portions of the calcaneus. No soft tissue abnormality is noted. No radiopaque foreign body is noted.  IMPRESSION: Severely displaced and comminuted fracture is seen involving the anterior and middle portions of the calcaneus.   Electronically Signed   By: Sabino Dick M.D.   On: 09/15/2014 14:20       Assessment/Plan 1. Fall from 40 feet 2. L2 fracture 3. Right open calcaneal fracture 4. Obvious left ankle/foot deformity, awaiting films to be read  Plan: 1. The patient will be admitted to the trauma service.  Await reading of all of his scans to determine further treatment.  I do not see any reason why at this time, he can not go to the OR tonight with ortho for repair of his open fracture.  NS has been called regarding his L2 fracture. A foley has been placed and he is on bedrest with logroll only privileges.  Remain NPO.  Further recommendations after all scans are complete.  Jhane Lorio E 09/15/2014, 4:30 PM Pager: [phone]

## 2014-09-15 NOTE — Progress Notes (Signed)
This is a patient who is well-known to Dr. Joya Salm after having previous cervical and lumbar fusions. He fell 40 feet out of a tree today of the lateral and landed on his feet. He has an open calcaneal fracture on the right. Plain films and CT scan of the abdomen showed a L2 fracture with loss of vertebral body height and some retropulsion but no kyphosis. He complains of significant back pain but denies leg pain or numbness tingling or weakness. He is moving his legs extremely well and strongly. He has good sensation. Awake and alert and conversant. He is in some distress because of his pain. His right ankle is wrapped and there are leaves under him and dried blood on the sheets. I have reviewed the films as has Dr. Joya Salm. Dr. Joya Salm will manage him. He has been admitted to the trauma service. Dr. Joya Salm will follow up with him either tonight or tomorrow. Disposition will be as per Dr. Joya Salm.

## 2014-09-15 NOTE — Anesthesia Postprocedure Evaluation (Signed)
  Anesthesia Post-op Note  Patient: Bradley Espinoza  Procedure(s) Performed: Procedure(s): IRRIGATION AND DEBRIDEMENT Ankle (Right)  Patient Location: PACU  Anesthesia Type:General  Level of Consciousness: awake, alert , oriented and patient cooperative  Airway and Oxygen Therapy: Patient Spontanous Breathing and Patient connected to nasal cannula oxygen  Post-op Pain: mild  Post-op Assessment: Post-op Vital signs reviewed, Patient's Cardiovascular Status Stable, Respiratory Function Stable, Patent Airway, No signs of Nausea or vomiting, Adequate PO intake and Pain level controlled  Post-op Vital Signs: Reviewed and stable  Last Vitals:  Filed Vitals:   09/15/14 2315  BP: 132/72  Pulse: 71  Temp:   Resp: 14    Complications: No apparent anesthesia complications

## 2014-09-15 NOTE — Consult Note (Signed)
ORTHOPAEDIC CONSULTATION  REQUESTING PHYSICIAN: Wandra Arthurs, MD  Chief Complaint: Bilateral heel pain and back pain  HPI: Bradley Espinoza is a 58 y.o. male who complains of a fall from a ladder from 6f. He c/o back pain  Past Medical History  Diagnosis Date  . Anxiety   . Depression   . Headache(784.0)   . Arthritis    Past Surgical History  Procedure Laterality Date  . Back surgery  2004  . Cervical spine surgery  2008   History   Social History  . Marital Status: Married    Spouse Name: N/A    Number of Children: N/A  . Years of Education: N/A   Social History Main Topics  . Smoking status: Current Every Day Smoker -- 1.00 packs/day for 10 years    Types: Cigarettes    Start date: 10/08/1976  . Smokeless tobacco: Never Used  . Alcohol Use: No  . Drug Use: No  . Sexual Activity: None   Other Topics Concern  . None   Social History Narrative   Family History  Problem Relation Age of Onset  . Diabetes Father   . Cancer Other    Allergies  Allergen Reactions  . Codeine Nausea And Vomiting   Prior to Admission medications   Medication Sig Start Date End Date Taking? Authorizing Provider  diazepam (VALIUM) 5 MG tablet Take 5 mg by mouth every 6 (six) hours as needed. For muscle pain    Historical Provider, MD  famotidine (PEPCID) 20 MG tablet Take 1 tablet (20 mg total) by mouth 2 (two) times daily. 10/02/12   JMaudry Diego MD  gabapentin (NEURONTIN) 400 MG capsule Take 400 mg by mouth 3 (three) times daily.    Historical Provider, MD  ibuprofen (ADVIL,MOTRIN) 600 MG tablet Take 1 tablet (600 mg total) by mouth every 6 (six) hours as needed. 11/16/13   JEvalee Jefferson PA-C  oxyCODONE (OXY IR/ROXICODONE) 5 MG immediate release tablet Take 5 mg by mouth every 4 (four) hours as needed.    Historical Provider, MD  OxyCODONE (OXYCONTIN) 15 mg T12A 12 hr tablet Take 15 mg by mouth every 12 (twelve) hours.    Historical Provider, MD  predniSONE (DELTASONE)  10 MG tablet Take 2 tablets (20 mg total) by mouth daily. 10/02/12   JMaudry Diego MD  predniSONE (DELTASONE) 10 MG tablet 6, 5, 4, 3, 2 then 1 tablet by mouth daily for 6 days total. 11/16/13   JEvalee Jefferson PA-C   Dg Tibia/fibula Right  09/15/2014   CLINICAL DATA:  Right lower leg pain swelling after falling 40 feet.  EXAM: RIGHT TIBIA AND FIBULA - 2 VIEW  COMPARISON:  None.  FINDINGS: No significant abnormalities of the tibia or fibula. There is a severely comminuted fracture of the calcaneus. There is chondromalacia of the patella but there is no knee effusion.  IMPRESSION: Normal tibia and fibula. Severely comminuted fracture of the right calcaneus.   Electronically Signed   By: JRozetta NunneryM.D.   On: 09/15/2014 14:00   Dg Pelvis Portable  09/15/2014   CLINICAL DATA:  58year old male with history of trauma after falling 40 feet. Chest pain.  EXAM: PORTABLE PELVIS 1-2 VIEWS  COMPARISON:  No priors.  FINDINGS: AP view of the pelvis demonstrates no definite acute displaced fractures of the bony pelvic ring. Bilateral proximal femurs as visualized appear intact, the femoral heads appear located on this single view examination. Degenerative changes of  osteoarthritis are noted in the hip joints bilaterally. Orthopedic fixation hardware in the lower lumbar spine is incompletely visualized.  IMPRESSION: 1. No evidence of significant acute traumatic injury to the bony pelvis.   Electronically Signed   By: Vinnie Langton M.D.   On: 09/15/2014 13:57   Dg Chest Port 1 View  09/15/2014   CLINICAL DATA:  Fall 40 feet.  Left chest pain.  EXAM: PORTABLE CHEST - 1 VIEW  COMPARISON:  05/28/2007  FINDINGS: No pneumothorax. Heart and mediastinal contours are within normal limits. No focal opacities or effusions. No acute bony abnormality.  IMPRESSION: No active disease.   Electronically Signed   By: Rolm Baptise M.D.   On: 09/15/2014 13:58   Dg Foot 2 Views Right  09/15/2014   CLINICAL DATA:  Right heel pain in  laceration after fall of 40 feet.  EXAM: RIGHT FOOT - 2 VIEW  COMPARISON:  None.  FINDINGS: Severely displaced and comminuted fracture is seen involving the anterior and middle portions of the calcaneus. No soft tissue abnormality is noted. No radiopaque foreign body is noted.  IMPRESSION: Severely displaced and comminuted fracture is seen involving the anterior and middle portions of the calcaneus.   Electronically Signed   By: Sabino Dick M.D.   On: 09/15/2014 14:20    Positive ROS: All other systems have been reviewed and were otherwise negative with the exception of those mentioned in the HPI and as above.  Labs cbc  Recent Labs  09/15/14 1315 09/15/14 1327  WBC 7.8  --   HGB 14.9 15.6  HCT 44.2 46.0  PLT 173  --     Labs inflam No results for input(s): CRP in the last 72 hours.  Invalid input(s): ESR  Labs coag  Recent Labs  09/15/14 1315  INR 0.94     Recent Labs  09/15/14 1315 09/15/14 1327  NA 141 141  K 4.3 4.1  CL 104 104  CO2 24  --   GLUCOSE 103* 103*  BUN 19 20  CREATININE 0.94 1.00  CALCIUM 9.2  --     Physical Exam: Filed Vitals:   09/15/14 1400  BP: 128/80  Pulse: 73  Temp:   Resp:    General: Alert, no acute distress Cardiovascular: No pedal edema Respiratory: No cyanosis, no use of accessory musculature GI: No organomegaly, abdomen is soft and non-tender Skin: No lesions in the area of chief complaint other than those listed below in MSK exam.  Neurologic: Sensation intact distally Psychiatric: Patient is competent for consent with normal mood and affect Lymphatic: No axillary or cervical lymphadenopathy  MUSCULOSKELETAL:  BLE: echymosis and heel pain compartments soft SILT DP/SP/S/S/T 2+ DP RLE: 68m laceration over medial calcaneus, no gross contamination   Other extremities are atraumatic with painless ROM and NVI.  Assessment: Bilateral calcaneus fracture, Right open fx.  L2 burst fracture  Plan: OR for I&D of the right  calc fx Splint both, Dr. DSharol Givento eval for surgical fixation on Sunday  Neuro cs for spine management Weight Bearing Status: NWB BLE    MEdmonia Lynch D, MD Cell [phone]  09/15/2014 3:13 PM

## 2014-09-15 NOTE — ED Notes (Signed)
Dr. Blackmon at bedside. 
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
## 2014-09-15 NOTE — ED Notes (Signed)
Pt to xray

## 2014-09-15 NOTE — ED Notes (Signed)
Per [facility], pt cutting trees with ladder fully extended and fell, landed on feet. Has an open area to medial right foot. Good pulses. 18g to bilateral AC's. Pt having pain to sacral area. Given 10 mg Morphine.

## 2014-09-15 NOTE — ED Provider Notes (Signed)
CSN: [id number]     Arrival date & time 09/15/14  1306 History   First MD Initiated Contact with Patient 09/15/14 1315     Chief Complaint  Patient presents with  . Trauma     (Consider location/radiation/quality/duration/timing/severity/associated sxs/prior Treatment) The history is provided by the patient.  Bradley Espinoza is a 58 y.o. male hx of anxiety, who presented with level II trauma. He was on top of a 30 foot ladder and fell and landed on right leg. Has severe right foot pain afterwards as well as back pain. Came by EMS and is noted to have open fracture of the right ankle area. Denies head injury or loss of consciousness or chest pain or abdominal pain. Has previous spinal surgeries in the past by Dr. Joya Salm. Tetanus up to date.   Level V caveat- condition of patient.    Past Medical History  Diagnosis Date  . Anxiety   . Depression   . Headache(784.0)   . Arthritis    Past Surgical History  Procedure Laterality Date  . Back surgery  2004  . Cervical spine surgery  2008   Family History  Problem Relation Age of Onset  . Diabetes Father   . Cancer Other    History  Substance Use Topics  . Smoking status: Current Every Day Smoker -- 1.00 packs/day for 10 years    Types: Cigarettes    Start date: 10/08/1976  . Smokeless tobacco: Never Used  . Alcohol Use: No    Review of Systems  Musculoskeletal: Positive for back pain.       R foot pain   All other systems reviewed and are negative.     Allergies  Codeine  Home Medications   Prior to Admission medications   Medication Sig Start Date End Date Taking? Authorizing Provider  diazepam (VALIUM) 5 MG tablet Take 5 mg by mouth every 6 (six) hours as needed. For muscle pain    Historical Provider, MD  famotidine (PEPCID) 20 MG tablet Take 1 tablet (20 mg total) by mouth 2 (two) times daily. 10/02/12   Maudry Diego, MD  gabapentin (NEURONTIN) 400 MG capsule Take 400 mg by mouth 3 (three) times daily.     Historical Provider, MD  ibuprofen (ADVIL,MOTRIN) 600 MG tablet Take 1 tablet (600 mg total) by mouth every 6 (six) hours as needed. 11/16/13   Evalee Jefferson, PA-C  oxyCODONE (OXY IR/ROXICODONE) 5 MG immediate release tablet Take 5 mg by mouth every 4 (four) hours as needed.    Historical Provider, MD  OxyCODONE (OXYCONTIN) 15 mg T12A 12 hr tablet Take 15 mg by mouth every 12 (twelve) hours.    Historical Provider, MD  predniSONE (DELTASONE) 10 MG tablet Take 2 tablets (20 mg total) by mouth daily. 10/02/12   Maudry Diego, MD  predniSONE (DELTASONE) 10 MG tablet 6, 5, 4, 3, 2 then 1 tablet by mouth daily for 6 days total. 11/16/13   Evalee Jefferson, PA-C   BP 158/77 mmHg  Pulse 77  Temp(Src) 98.1 F (36.7 C) (Core (Comment))  Resp 20  Ht 6\' 1"  (1.854 m)  Wt 180 lb (81.647 kg)  BMI 23.75 kg/m2  SpO2 98% Physical Exam  Constitutional: He is oriented to person, place, and time.  Uncomfortable   HENT:  Head: Normocephalic and atraumatic.  Mouth/Throat: Oropharynx is clear and moist.  Eyes: Conjunctivae are normal. Pupils are equal, round, and reactive to light.  Neck:  C collar in place  Cardiovascular: Normal rate, regular rhythm and normal heart sounds.   Pulmonary/Chest: Effort normal and breath sounds normal. No respiratory distress. He has no wheezes. He has no rales.  Abdominal: Soft. Bowel sounds are normal. He exhibits no distension. There is no tenderness. There is no rebound and no guarding.  Musculoskeletal:  + lower lumbar tenderness. Pelvis stable. Open fracture R ankle area with swelling. 2+ pulses. L foot tender but no obvious deformity   Neurological: He is alert and oriented to person, place, and time. No cranial nerve deficit. Coordination normal.  Skin: Skin is warm and dry.  Psychiatric: He has a normal mood and affect. His behavior is normal. Judgment and thought content normal.  Nursing note and vitals reviewed.   ED Course  Procedures (including critical care  time)  CRITICAL CARE Performed by: Darl Householder, DAVID   Total critical care time: 30 min   Critical care time was exclusive of separately billable procedures and treating other patients.  Critical care was necessary to treat or prevent imminent or life-threatening deterioration.  Critical care was time spent personally by me on the following activities: development of treatment plan with patient and/or surrogate as well as nursing, discussions with consultants, evaluation of patient's response to treatment, examination of patient, obtaining history from patient or surrogate, ordering and performing treatments and interventions, ordering and review of laboratory studies, ordering and review of radiographic studies, pulse oximetry and re-evaluation of patient's condition.   Labs Review Labs Reviewed  COMPREHENSIVE METABOLIC PANEL - Abnormal; Notable for the following:    Glucose, Bld 103 (*)    GFR calc non Af Amer 90 (*)    All other components within normal limits  I-STAT CHEM 8, ED - Abnormal; Notable for the following:    Glucose, Bld 103 (*)    Calcium, Ion 1.11 (*)    All other components within normal limits  CBC  ETHANOL  PROTIME-INR  SAMPLE TO BLOOD BANK    Imaging Review Dg Tibia/fibula Right  09/15/2014   CLINICAL DATA:  Right lower leg pain swelling after falling 40 feet.  EXAM: RIGHT TIBIA AND FIBULA - 2 VIEW  COMPARISON:  None.  FINDINGS: No significant abnormalities of the tibia or fibula. There is a severely comminuted fracture of the calcaneus. There is chondromalacia of the patella but there is no knee effusion.  IMPRESSION: Normal tibia and fibula. Severely comminuted fracture of the right calcaneus.   Electronically Signed   By: Rozetta Nunnery M.D.   On: 09/15/2014 14:00   Dg Pelvis Portable  09/15/2014   CLINICAL DATA:  58 year old male with history of trauma after falling 40 feet. Chest pain.  EXAM: PORTABLE PELVIS 1-2 VIEWS  COMPARISON:  No priors.  FINDINGS: AP view of  the pelvis demonstrates no definite acute displaced fractures of the bony pelvic ring. Bilateral proximal femurs as visualized appear intact, the femoral heads appear located on this single view examination. Degenerative changes of osteoarthritis are noted in the hip joints bilaterally. Orthopedic fixation hardware in the lower lumbar spine is incompletely visualized.  IMPRESSION: 1. No evidence of significant acute traumatic injury to the bony pelvis.   Electronically Signed   By: Vinnie Langton M.D.   On: 09/15/2014 13:57   Dg Chest Port 1 View  09/15/2014   CLINICAL DATA:  Fall 40 feet.  Left chest pain.  EXAM: PORTABLE CHEST - 1 VIEW  COMPARISON:  05/28/2007  FINDINGS: No pneumothorax. Heart and mediastinal contours are within normal limits. No  focal opacities or effusions. No acute bony abnormality.  IMPRESSION: No active disease.   Electronically Signed   By: Rolm Baptise M.D.   On: 09/15/2014 13:58   Dg Foot 2 Views Right  09/15/2014   CLINICAL DATA:  Right heel pain in laceration after fall of 40 feet.  EXAM: RIGHT FOOT - 2 VIEW  COMPARISON:  None.  FINDINGS: Severely displaced and comminuted fracture is seen involving the anterior and middle portions of the calcaneus. No soft tissue abnormality is noted. No radiopaque foreign body is noted.  IMPRESSION: Severely displaced and comminuted fracture is seen involving the anterior and middle portions of the calcaneus.   Electronically Signed   By: Sabino Dick M.D.   On: 09/15/2014 14:20     EKG Interpretation None      MDM   Final diagnoses:  Leg injury, right, initial encounter  Back injury  Trauma    Bradley Espinoza is a 58 y.o. male here with open fracture R ankle. Also concerned for spinal fracture. Will get xrays, trauma scan.   2 PM Xray showed calcaneal fracture. Spinal xrays ordered. Consulted Dr. Percell Miller, who will see patient. CTs pending.   3:45 PM CT showed burst fracture L2. Dr. Percell Miller plans on taking patient to OR  later today to wash out ankle. Dr. Sharol Given will fix the calcanus next week. Will consult Dr. Ronnald Ramp from neurosurgery for L2 fracture. Will admit to trauma.   Wandra Arthurs, MD 09/15/14 [phone]

## 2014-09-15 NOTE — ED Notes (Signed)
RN called from CT due to patient being in extreme pain, EDP made aware, orders received

## 2014-09-15 NOTE — Transfer of Care (Signed)
Immediate Anesthesia Transfer of Care Note  Patient: Bradley Espinoza  Procedure(s) Performed: Procedure(s): IRRIGATION AND DEBRIDEMENT Ankle (Right)  Patient Location: PACU  Anesthesia Type:General  Level of Consciousness: awake, oriented and patient cooperative  Airway & Oxygen Therapy: Patient Spontanous Breathing and Patient connected to nasal cannula oxygen  Post-op Assessment: Report given to PACU RN and Post -op Vital signs reviewed and stable  Post vital signs: Reviewed and stable  Complications: No apparent anesthesia complications

## 2014-09-15 NOTE — Progress Notes (Signed)
Orthopedic Tech Progress Note Patient Details:  Bradley Espinoza May 29, 1956 [id number]  Ortho Devices Type of Ortho Device: Ace wrap, Post (short leg) splint Ortho Device/Splint Location: rle Ortho Device/Splint Interventions: Application As ordered by Dr. Maryelizabeth Rowan, Hershy Flenner 09/15/2014, 2:13 PM
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
## 2014-09-15 NOTE — Op Note (Signed)
09/15/2014  10:03 PM  PATIENT:  Bradley Espinoza    PRE-OPERATIVE DIAGNOSIS:  Open right ankle fracture  POST-OPERATIVE DIAGNOSIS:  Same  PROCEDURE:  IRRIGATION AND DEBRIDEMENT Ankle  SURGEON:  Treesa Mccully, D, MD  ASSISTANT: None   ANESTHESIA:   Gen  PREOPERATIVE INDICATIONS:  [name] is a  58 y.o. male with a diagnosis of Open right ankle fracture who failed conservative measures and elected for surgical management.    The risks benefits and alternatives were discussed with the patient preoperatively including but not limited to the risks of infection, bleeding, nerve injury, cardiopulmonary complications, the need for revision surgery, among others, and the patient was willing to proceed.  OPERATIVE IMPLANTS: none  OPERATIVE FINDINGS: no gross contamination  BLOOD LOSS: min  COMPLICATIONS: none  TOURNIQUET TIME: none  OPERATIVE PROCEDURE:  Patient was identified in the preoperative holding area and site was marked by me He was transported to the operating theater and placed on the table in supine position taking care to pad all bony prominences. After a preincinduction time out anesthesia was induced. The right lower extremity was prepped and draped in normal sterile fashion and a pre-incision timeout was performed. He received ancef for preoperative antibiotics.   I extended his traumatic wound incision proximally and distally until it was roughly 4 cm incision. I bluntly dissected down and felt highly comminuted fracture there was a small piece of bone superficially that I did remove. As found no gross contamination and explored his wound. I did manipulate some of his fracture to make the bone pieces less prominent.  I then thoroughly irrigated the wound with 3 L of normal saline.  Next I closed the surgical incision portion of the wound and left the small traumatic wound open to drain if need be.  I then placed a sterile dressing and a bulky Jones  splint. I then removed the drapes.  Next I placed a bulky Jones splint on his left leg as well. He was then taken the PACU in stable condition.  POST OPERATIVE PLAN: NWB BLE, Dr. Sharol Given to evaluate for Definitive surgery on Sunday    This note was generated using a template and dragon dictation system. In light of that, I have reviewed the note and all aspects of it are applicable to this case. Any dictation errors are due to the computerized dictation system.

## 2014-09-15 NOTE — ED Notes (Signed)
Spoke to [facility] regarding foley catheter and pain medication. Verbal order for 20mcg fentanyl placed. Foley cath to be placed in the OR

## 2014-09-15 NOTE — ED Notes (Signed)
C-collar removed by patient

## 2014-09-15 NOTE — Anesthesia Procedure Notes (Signed)
Procedure Name: Intubation Date/Time: 09/15/2014 8:27 PM Performed by: Hollie Salk Z Pre-anesthesia Checklist: Patient identified, Emergency Drugs available, Suction available, Patient being monitored and Timeout performed Patient Re-evaluated:Patient Re-evaluated prior to inductionOxygen Delivery Method: Circle system utilized Preoxygenation: Pre-oxygenation with 100% oxygen Intubation Type: IV induction, Rapid sequence and Cricoid Pressure applied Laryngoscope Size: Mac and 4 Grade View: Grade I Tube type: Oral Number of attempts: 1 Airway Equipment and Method: Stylet Placement Confirmation: ETT inserted through vocal cords under direct vision,  positive ETCO2 and breath sounds checked- equal and bilateral Secured at: 23 cm Tube secured with: Tape Dental Injury: Teeth and Oropharynx as per pre-operative assessment

## 2014-09-15 NOTE — ED Notes (Signed)
Pt complaining of left ankle pain, with numbness and tingling. Primary RN made aware. Pt states he is still having severe lower back pain.

## 2014-09-15 NOTE — ED Notes (Signed)
Pt requesting to get up and walk to restroom; informed pt of bedrest orders. Urinal at bedside. Pt uncomfortable in bed; c/o pain in his back.

## 2014-09-16 LAB — CBC
HEMATOCRIT: 37.2 % — AB (ref 39.0–52.0)
HEMOGLOBIN: 12.6 g/dL — AB (ref 13.0–17.0)
MCH: 31.6 pg (ref 26.0–34.0)
MCHC: 33.9 g/dL (ref 30.0–36.0)
MCV: 93.2 fL (ref 78.0–100.0)
Platelets: 144 10*3/uL — ABNORMAL LOW (ref 150–400)
RBC: 3.99 MIL/uL — ABNORMAL LOW (ref 4.22–5.81)
RDW: 12.4 % (ref 11.5–15.5)
WBC: 7.7 10*3/uL (ref 4.0–10.5)

## 2014-09-16 LAB — BASIC METABOLIC PANEL
Anion gap: 14 (ref 5–15)
BUN: 13 mg/dL (ref 6–23)
CALCIUM: 8.6 mg/dL (ref 8.4–10.5)
CO2: 22 mEq/L (ref 19–32)
Chloride: 99 mEq/L (ref 96–112)
Creatinine, Ser: 0.72 mg/dL (ref 0.50–1.35)
GFR calc Af Amer: >90 mL/min (ref >90)
Glucose, Bld: 124 mg/dL — ABNORMAL HIGH (ref 70–99)
Potassium: 4.1 mEq/L (ref 3.7–5.3)
Sodium: 135 mEq/L — ABNORMAL LOW (ref 137–147)

## 2014-09-16 MED ORDER — ONDANSETRON HCL 4 MG/2ML IJ SOLN: 4.0000 mg | Freq: Four times a day (QID) | INTRAMUSCULAR | Status: DC | PRN

## 2014-09-16 MED ORDER — PANTOPRAZOLE SODIUM 40 MG IV SOLR
40.0000 mg | Freq: Every day | INTRAVENOUS | Status: DC
  Administered 2014-09-16 – 2014-09-19 (×2): 40 mg via INTRAVENOUS
  Filled 2014-09-16 (×6): qty 40

## 2014-09-16 MED ORDER — PANTOPRAZOLE SODIUM 40 MG PO TBEC
40.0000 mg | DELAYED_RELEASE_TABLET | Freq: Every day | ORAL | Status: DC
  Administered 2014-09-17 – 2014-09-22 (×5): 40 mg via ORAL
  Filled 2014-09-16 (×6): qty 1

## 2014-09-16 MED ORDER — OXYCODONE HCL ER 20 MG PO T12A
20.0000 mg | EXTENDED_RELEASE_TABLET | Freq: Two times a day (BID) | ORAL | Status: DC
  Administered 2014-09-16 – 2014-09-18 (×5): 20 mg via ORAL
  Filled 2014-09-16 (×4): qty 2
  Filled 2014-09-16: qty 1

## 2014-09-16 MED ORDER — PREGABALIN 75 MG PO CAPS
75.0000 mg | ORAL_CAPSULE | Freq: Two times a day (BID) | ORAL | Status: DC
  Administered 2014-09-16 – 2014-09-22 (×13): 75 mg via ORAL
  Filled 2014-09-16 (×13): qty 1

## 2014-09-16 MED ORDER — CEFAZOLIN SODIUM-DEXTROSE 2-3 GM-% IV SOLR
2.0000 g | Freq: Four times a day (QID) | INTRAVENOUS | Status: AC
  Administered 2014-09-16 – 2014-09-18 (×12): 2 g via INTRAVENOUS
  Filled 2014-09-16 (×13): qty 50

## 2014-09-16 MED ORDER — POLYETHYLENE GLYCOL 3350 17 G PO PACK
17.0000 g | PACK | Freq: Every day | ORAL | Status: DC
  Administered 2014-09-16 – 2014-09-21 (×5): 17 g via ORAL
  Filled 2014-09-16 (×9): qty 1

## 2014-09-16 MED ORDER — DOCUSATE SODIUM 100 MG PO CAPS
100.0000 mg | ORAL_CAPSULE | Freq: Two times a day (BID) | ORAL | Status: DC
  Administered 2014-09-16 – 2014-09-18 (×6): 100 mg via ORAL
  Filled 2014-09-16 (×9): qty 1

## 2014-09-16 MED ORDER — CETYLPYRIDINIUM CHLORIDE 0.05 % MT LIQD
7.0000 mL | Freq: Two times a day (BID) | OROMUCOSAL | Status: DC
  Administered 2014-09-16 – 2014-09-21 (×9): 7 mL via OROMUCOSAL

## 2014-09-16 MED ORDER — ONDANSETRON HCL 4 MG PO TABS: 4.0000 mg | ORAL_TABLET | Freq: Four times a day (QID) | ORAL | Status: DC | PRN

## 2014-09-16 MED ORDER — METHOCARBAMOL 750 MG PO TABS
1500.0000 mg | ORAL_TABLET | Freq: Four times a day (QID) | ORAL | Status: DC
  Administered 2014-09-16 – 2014-09-19 (×15): 1500 mg via ORAL
  Filled 2014-09-16 (×27): qty 2

## 2014-09-16 MED ORDER — SODIUM CHLORIDE 0.9 % IV SOLN
INTRAVENOUS | Status: DC
  Administered 2014-09-16: 01:00:00 via INTRAVENOUS

## 2014-09-16 MED ORDER — ENOXAPARIN SODIUM 40 MG/0.4ML ~~LOC~~ SOLN
40.0000 mg | SUBCUTANEOUS | Status: DC
  Administered 2014-09-16 – 2014-09-22 (×6): 40 mg via SUBCUTANEOUS
  Filled 2014-09-16 (×7): qty 0.4
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
## 2014-09-16 NOTE — Progress Notes (Signed)
Orthopedic Tech Progress Note Patient Details:  Bradley Espinoza 17-Apr-1956 [id number] Called in order to [facility]. Patient ID: Bradley Espinoza, male   DOB: 05/13/1956, 58 y.o.   MRN: [id number]   [name] 09/16/2014, 2:04 PM

## 2014-09-16 NOTE — Progress Notes (Addendum)
Pt continue to bleed through compression dressing on right heel. Complaining of numbness and tingling in right and left lateral upper thigh. Toes warm bilaterally with good cap refill. Spoke with Otila Kluver in the [facility] on behalf of Dr. Percell Miller. Ordered to elevate right leg and reinforce dressing. And call neurosurgery regaurding numbness and tingling in thighs. Will continue to monitor.   Spoke with Dr. Ronnald Ramp, relating to the numbness and tingling in lateral thighs bilaterally. No new orders received. Will continue to monitor.   Lum Babe, RN

## 2014-09-16 NOTE — Plan of Care (Signed)
Problem: Phase I Progression Outcomes Goal: Pain controlled with appropriate interventions Outcome: Not Progressing On hydromorphone PCA, hitting max limit. Needs better pain management  Goal: OOB as tolerated unless otherwise ordered Outcome: Not Applicable Date Met:  29/92/42 Goal: Incision/dressings dry and intact Outcome: Not Progressing Continues to bleed from right compression dressing. MD aware. Dressing reinforced.  Goal: Tubes/drains patent Outcome: Not Applicable Date Met:  68/34/19 Goal: Voiding-avoid urinary catheter unless indicated Outcome: Completed/Met Date Met:  09/16/14 Goal: Vital signs/hemodynamically stable Outcome: Completed/Met Date Met:  09/16/14

## 2014-09-16 NOTE — Addendum Note (Signed)
Addendum  created 09/16/14 0046 by Zorita Pang, CRNA   Modules edited: Charges VN

## 2014-09-16 NOTE — Consult Note (Signed)
Reason for Consult: Bilateral calcaneal fractures Referring Physician: Dr. Ninfa Espinoza is an 58 y.o. male.  HPI: Patient is a 58 year old gentleman who states that he was up on a ladder approximately 30 feet when he was trimming a tree branch with a chain saw when the branch snapped he states he was thrown 10 feet in the air and landed on both feet. Patient states he had immediate onset of lower back pain. Patient is status post lumbar spine fusion. Patient is a smoker.  Past Medical History  Diagnosis Date  . Anxiety   . Depression   . Headache(784.0)   . Arthritis     Past Surgical History  Procedure Laterality Date  . Back surgery  2004  . Cervical spine surgery  2008  . Inguinal hernia repair Bilateral     Family History  Problem Relation Age of Onset  . Diabetes Father   . Cancer Other     Social History:  reports that he has been smoking Cigarettes.  He started smoking about 37 years ago. He has a 10 pack-year smoking history. He has never used smokeless tobacco. He reports that he does not drink alcohol or use illicit drugs.  Allergies:  Allergies  Allergen Reactions  . Codeine Nausea And Vomiting    Medications: I have reviewed the patient's current medications.  Results for orders placed or performed during the hospital encounter of 09/15/14 (from the past 48 hour(s))  Comprehensive metabolic panel     Status: Abnormal   Collection Time: 09/15/14  1:15 PM  Result Value Ref Range   Sodium 141 137 - 147 mEq/L   Potassium 4.3 3.7 - 5.3 mEq/L   Chloride 104 96 - 112 mEq/L   CO2 24 19 - 32 mEq/L   Glucose, Bld 103 (H) 70 - 99 mg/dL   BUN 19 6 - 23 mg/dL   Creatinine, Ser 0.94 0.50 - 1.35 mg/dL   Calcium 9.2 8.4 - 10.5 mg/dL   Total Protein 7.0 6.0 - 8.3 g/dL   Albumin 4.0 3.5 - 5.2 g/dL   AST 23 0 - 37 U/L   ALT 24 0 - 53 U/L   Alkaline Phosphatase 80 39 - 117 U/L   Total Bilirubin 0.7 0.3 - 1.2 mg/dL   GFR calc non Af Amer 90 (L) >90 mL/min    GFR calc Af Amer >90 >90 mL/min    Comment: (NOTE) The eGFR has been calculated using the CKD EPI equation. This calculation has not been validated in all clinical situations. eGFR's persistently <90 mL/min signify possible Chronic Kidney Disease.    Anion gap 13 5 - 15  CBC     Status: None   Collection Time: 09/15/14  1:15 PM  Result Value Ref Range   WBC 7.8 4.0 - 10.5 K/uL   RBC 4.73 4.22 - 5.81 MIL/uL   Hemoglobin 14.9 13.0 - 17.0 g/dL   HCT 44.2 39.0 - 52.0 %   MCV 93.4 78.0 - 100.0 fL   MCH 31.5 26.0 - 34.0 pg   MCHC 33.7 30.0 - 36.0 g/dL   RDW 12.5 11.5 - 15.5 %   Platelets 173 150 - 400 K/uL  Ethanol     Status: None   Collection Time: 09/15/14  1:15 PM  Result Value Ref Range   Alcohol, Ethyl (B) <11 0 - 11 mg/dL    Comment:        LOWEST DETECTABLE LIMIT FOR SERUM ALCOHOL IS 11 mg/dL  FOR MEDICAL PURPOSES ONLY   Protime-INR     Status: None   Collection Time: 09/15/14  1:15 PM  Result Value Ref Range   Prothrombin Time 12.7 11.6 - 15.2 seconds   INR 0.94 0.00 - 1.49  Sample to Blood Bank     Status: None   Collection Time: 09/15/14  1:18 PM  Result Value Ref Range   Blood Bank Specimen SAMPLE AVAILABLE FOR TESTING    Sample Expiration 09/16/2014   I-Stat Chem 8, ED     Status: Abnormal   Collection Time: 09/15/14  1:27 PM  Result Value Ref Range   Sodium 141 137 - 147 mEq/L   Potassium 4.1 3.7 - 5.3 mEq/L   Chloride 104 96 - 112 mEq/L   BUN 20 6 - 23 mg/dL   Creatinine, Ser 1.00 0.50 - 1.35 mg/dL   Glucose, Bld 103 (H) 70 - 99 mg/dL   Calcium, Ion 1.11 (L) 1.12 - 1.23 mmol/L   TCO2 20 0 - 100 mmol/L   Hemoglobin 15.6 13.0 - 17.0 g/dL   HCT 46.0 39.0 - 52.0 %  CBC     Status: Abnormal   Collection Time: 09/16/14  4:22 AM  Result Value Ref Range   WBC 7.7 4.0 - 10.5 K/uL   RBC 3.99 (L) 4.22 - 5.81 MIL/uL   Hemoglobin 12.6 (L) 13.0 - 17.0 g/dL    Comment: REPEATED TO VERIFY   HCT 37.2 (L) 39.0 - 52.0 %   MCV 93.2 78.0 - 100.0 fL   MCH 31.6 26.0 -  34.0 pg   MCHC 33.9 30.0 - 36.0 g/dL   RDW 12.4 11.5 - 15.5 %   Platelets 144 (L) 150 - 400 K/uL  Basic metabolic panel     Status: Abnormal   Collection Time: 09/16/14  4:22 AM  Result Value Ref Range   Sodium 135 (L) 137 - 147 mEq/L   Potassium 4.1 3.7 - 5.3 mEq/L   Chloride 99 96 - 112 mEq/L   CO2 22 19 - 32 mEq/L   Glucose, Bld 124 (H) 70 - 99 mg/dL   BUN 13 6 - 23 mg/dL   Creatinine, Ser 0.72 0.50 - 1.35 mg/dL   Calcium 8.6 8.4 - 10.5 mg/dL   GFR calc non Af Amer >90 >90 mL/min   GFR calc Af Amer >90 >90 mL/min    Comment: (NOTE) The eGFR has been calculated using the CKD EPI equation. This calculation has not been validated in all clinical situations. eGFR's persistently <90 mL/min signify possible Chronic Kidney Disease.    Anion gap 14 5 - 15    Dg Lumbar Spine Complete  09/15/2014   CLINICAL DATA:  Fall 40 feet from tree. Central lower back pain. Prior lumbar surgeries.  EXAM: LUMBAR SPINE - COMPLETE 4+ VIEW  COMPARISON:  Plain films 11/30/2013 and 11/16/2013  FINDINGS: There is a moderate compression in the L2 vertebral body. This is new since prior study. No malalignment. No additional fracture. Postoperative changes noted from posterior fusion L4-S1.  IMPRESSION: Moderate compression fracture at L2.   Electronically Signed   By: Bradley Espinoza M.D.   On: 09/15/2014 16:45   Dg Tibia/fibula Right  09/15/2014   CLINICAL DATA:  Right lower leg pain swelling after falling 40 feet.  EXAM: RIGHT TIBIA AND FIBULA - 2 VIEW  COMPARISON:  None.  FINDINGS: No significant abnormalities of the tibia or fibula. There is a severely comminuted fracture of the calcaneus. There is chondromalacia  of the patella but there is no knee effusion.  IMPRESSION: Normal tibia and fibula. Severely comminuted fracture of the right calcaneus.   Electronically Signed   By: Bradley Espinoza M.D.   On: 09/15/2014 14:00   Ct Head Wo Contrast  09/15/2014   CLINICAL DATA:  58 year old male fell more than 40 feet  from tree. Initial encounter. Open right calcaneus fracture. Severe spine pain.  EXAM: CT HEAD WITHOUT CONTRAST  CT CERVICAL SPINE WITHOUT CONTRAST  TECHNIQUE: Multidetector CT imaging of the head and cervical spine was performed following the standard protocol without intravenous contrast. Multiplanar CT image reconstructions of the cervical spine were also generated.  COMPARISON:  Cervical CT myelogram 06/16/2014. Head CT without contrast 09/15/2004.  FINDINGS: CT HEAD FINDINGS  Mild paranasal sinus mucosal thickening. Mastoids and tympanic cavities are clear. Visualized orbits are within normal limits. Calvarium intact. Mild left vertex scalp soft tissue scarring.  Calcified atherosclerosis at the skull base. Cerebral volume is normal. No midline shift, ventriculomegaly, mass effect, evidence of mass lesion, intracranial hemorrhage or evidence of cortically based acute infarction. Gray-white matter differentiation is within normal limits throughout the brain. No suspicious intracranial vascular hyperdensity.  CT CERVICAL SPINE FINDINGS  Chronic C5-C6 ACDF. Solid interbody arthrodesis. Hardware appears intact without loosening.  Stable cervical vertebral height and alignment. Visualized skull base is intact. No atlanto-occipital dissociation. Bilateral posterior element alignment is within normal limits. Cervicothoracic junction alignment is within normal limits.  Moderate to severe disc space loss and endplate spurring at O8-T2. Moderate to severe facet hypertrophy at C7-T1, worse on the left. Multiple subchondral cysts on that side and trace vacuum phenomena.  No acute cervical spine fracture identified. Visible upper thoracic levels appear intact.  Negative lung apices except for mild paraseptal emphysema. Negative non contrast paraspinal soft tissues. Small volume retained secretions in the pharynx.  IMPRESSION: 1. Stable and normal noncontrast CT appearance of the brain. 2. No acute fracture or listhesis  identified in the cervical spine. Ligamentous injury is not excluded. 3. Stable postoperative appearance of the cervical spine. Adjacent segment disease at C6-C7 and up to severe facet arthropathy at C7-T1.   Electronically Signed   By: Lars Pinks M.D.   On: 09/15/2014 16:31   Ct Chest W Contrast  09/15/2014   CLINICAL DATA:  58 year old male fell more than 40 feet from tree. Initial encounter. Open right calcaneus fracture. Severe spine pain.  EXAM: CT CHEST, ABDOMEN, AND PELVIS WITH CONTRAST  TECHNIQUE: Multidetector CT imaging of the chest, abdomen and pelvis was performed following the standard protocol during bolus administration of intravenous contrast.  CONTRAST:  100 mL Omnipaque 300.  COMPARISON:  Trauma series chest and pelvis radiographs from the same day reported separately. CT cervical spine from the same day reported separately. Lumbar spine CT myelogram 06/16/2014.  FINDINGS: CT CHEST FINDINGS  Major airways are patent. Mild dependent atelectasis. No pneumothorax, pulmonary contusion, or pleural effusion. Trace paraseptal emphysema.  No pericardial effusion. No mediastinal hematoma. Negative soft tissues at the thoracic inlet. Major mediastinal vascular structures appear intact. Minor calcified coronary artery plaque. No mediastinal, hilar, or axillary lymphadenopathy.  Sternum intact. Visualized shoulder osseous structures in intact. Chronic right tenth and eleventh rib fractures. No acute rib fracture. No thoracic spine fracture identified.  CT ABDOMEN AND PELVIS FINDINGS  Comminuted burst fracture of L2 with loss of height up to 50% compared to the September exam. Retropulsion of the posterior inferior endplate of about 6 mm. Subsequent lower L2 level spinal  canal narrowing to 7 mm (versus 15 mm at the same level in September). Small volume paraspinal hematoma. L2 pedicles and posterior elements intact.  Remaining lumbar levels are intact. Preexisting L4-L5 and L5-S1 posterior and interbody  fusion. Fusion hardware appears stable and intact sacrum and SI joints intact. Pelvis intact. Proximal femurs intact.  No pelvic free fluid. Unremarkable bladder. Negative distal colon except for redundancy and occasional diverticula. Redundant left colon. Negative transverse colon. Redundant right colon. Normal appendix. No dilated or inflamed small bowel. Decompressed stomach and duodenum.  Liver, gallbladder, spleen, pancreas, and adrenal glands appear intact. No abdominal free fluid. Portal venous system within normal limits. Major arterial structures in the abdomen and pelvis are patent. Aortoiliac calcified atherosclerosis noted. Kidneys intact with normal contrast excretion. Small renal cysts. No lymphadenopathy identified.  IMPRESSION: 1. Acute L2 burst fracture with retropulsion of bone narrowing the spinal canal by 50%. L2 loss of height up to 50%. 2. No other acute traumatic injury identified in the chest, abdomen, or pelvis. 3. Preexisting L4 through S1 posterior and interbody fusion. Preliminary report of this study without discrepancy to the above discussed by telephone with Dr. Shirlyn Goltz at 1541 hr on 09/15/2014.   Electronically Signed   By: Lars Pinks M.D.   On: 09/15/2014 16:24   Ct Cervical Spine Wo Contrast  09/15/2014   CLINICAL DATA:  58 year old male fell more than 40 feet from tree. Initial encounter. Open right calcaneus fracture. Severe spine pain.  EXAM: CT HEAD WITHOUT CONTRAST  CT CERVICAL SPINE WITHOUT CONTRAST  TECHNIQUE: Multidetector CT imaging of the head and cervical spine was performed following the standard protocol without intravenous contrast. Multiplanar CT image reconstructions of the cervical spine were also generated.  COMPARISON:  Cervical CT myelogram 06/16/2014. Head CT without contrast 09/15/2004.  FINDINGS: CT HEAD FINDINGS  Mild paranasal sinus mucosal thickening. Mastoids and tympanic cavities are clear. Visualized orbits are within normal limits. Calvarium intact.  Mild left vertex scalp soft tissue scarring.  Calcified atherosclerosis at the skull base. Cerebral volume is normal. No midline shift, ventriculomegaly, mass effect, evidence of mass lesion, intracranial hemorrhage or evidence of cortically based acute infarction. Gray-white matter differentiation is within normal limits throughout the brain. No suspicious intracranial vascular hyperdensity.  CT CERVICAL SPINE FINDINGS  Chronic C5-C6 ACDF. Solid interbody arthrodesis. Hardware appears intact without loosening.  Stable cervical vertebral height and alignment. Visualized skull base is intact. No atlanto-occipital dissociation. Bilateral posterior element alignment is within normal limits. Cervicothoracic junction alignment is within normal limits.  Moderate to severe disc space loss and endplate spurring at Z6-X0. Moderate to severe facet hypertrophy at C7-T1, worse on the left. Multiple subchondral cysts on that side and trace vacuum phenomena.  No acute cervical spine fracture identified. Visible upper thoracic levels appear intact.  Negative lung apices except for mild paraseptal emphysema. Negative non contrast paraspinal soft tissues. Small volume retained secretions in the pharynx.  IMPRESSION: 1. Stable and normal noncontrast CT appearance of the brain. 2. No acute fracture or listhesis identified in the cervical spine. Ligamentous injury is not excluded. 3. Stable postoperative appearance of the cervical spine. Adjacent segment disease at C6-C7 and up to severe facet arthropathy at C7-T1.   Electronically Signed   By: Lars Pinks M.D.   On: 09/15/2014 16:31   Ct Abdomen Pelvis W Contrast  09/15/2014   CLINICAL DATA:  58 year old male fell more than 40 feet from tree. Initial encounter. Open right calcaneus fracture. Severe spine pain.  EXAM: CT CHEST, ABDOMEN, AND PELVIS WITH CONTRAST  TECHNIQUE: Multidetector CT imaging of the chest, abdomen and pelvis was performed following the standard protocol during  bolus administration of intravenous contrast.  CONTRAST:  100 mL Omnipaque 300.  COMPARISON:  Trauma series chest and pelvis radiographs from the same day reported separately. CT cervical spine from the same day reported separately. Lumbar spine CT myelogram 06/16/2014.  FINDINGS: CT CHEST FINDINGS  Major airways are patent. Mild dependent atelectasis. No pneumothorax, pulmonary contusion, or pleural effusion. Trace paraseptal emphysema.  No pericardial effusion. No mediastinal hematoma. Negative soft tissues at the thoracic inlet. Major mediastinal vascular structures appear intact. Minor calcified coronary artery plaque. No mediastinal, hilar, or axillary lymphadenopathy.  Sternum intact. Visualized shoulder osseous structures in intact. Chronic right tenth and eleventh rib fractures. No acute rib fracture. No thoracic spine fracture identified.  CT ABDOMEN AND PELVIS FINDINGS  Comminuted burst fracture of L2 with loss of height up to 50% compared to the September exam. Retropulsion of the posterior inferior endplate of about 6 mm. Subsequent lower L2 level spinal canal narrowing to 7 mm (versus 15 mm at the same level in September). Small volume paraspinal hematoma. L2 pedicles and posterior elements intact.  Remaining lumbar levels are intact. Preexisting L4-L5 and L5-S1 posterior and interbody fusion. Fusion hardware appears stable and intact sacrum and SI joints intact. Pelvis intact. Proximal femurs intact.  No pelvic free fluid. Unremarkable bladder. Negative distal colon except for redundancy and occasional diverticula. Redundant left colon. Negative transverse colon. Redundant right colon. Normal appendix. No dilated or inflamed small bowel. Decompressed stomach and duodenum.  Liver, gallbladder, spleen, pancreas, and adrenal glands appear intact. No abdominal free fluid. Portal venous system within normal limits. Major arterial structures in the abdomen and pelvis are patent. Aortoiliac calcified  atherosclerosis noted. Kidneys intact with normal contrast excretion. Small renal cysts. No lymphadenopathy identified.  IMPRESSION: 1. Acute L2 burst fracture with retropulsion of bone narrowing the spinal canal by 50%. L2 loss of height up to 50%. 2. No other acute traumatic injury identified in the chest, abdomen, or pelvis. 3. Preexisting L4 through S1 posterior and interbody fusion. Preliminary report of this study without discrepancy to the above discussed by telephone with Dr. Shirlyn Goltz at 1541 hr on 09/15/2014.   Electronically Signed   By: Lars Pinks M.D.   On: 09/15/2014 16:24   Ct Ankle Right Wo Contrast  09/15/2014   CLINICAL DATA:  EVALUATE BILATERAL CALCANEUS FRACTURES.  EXAM: CT OF THE RIGHT/ANKLE FOOT WITHOUT CONTRAST. CT OF THE LEFT FOOT/ANKLE WITHOUT CONTRAST.  TECHNIQUE: Multidetector CT imaging of the right foot was performed according to the standard protocol. Multiplanar CT image reconstructions were also generated.  COMPARISON:  Radiographs, same date.  FINDINGS: Left ankle/foot:  Comminuted and displaced calcaneal fractures are demonstrated. This is largely a central depression type fracture. The medial aspects of the posterior and middle facets are intact and the joint spaces are maintained. Laterally the posterior facet is fractured and depressed. There are also non articular fractures involving the posterior aspect of the calcaneus with a posterior Espinoza shaped vertical split extending out through both the medial and lateral cortices.  There is a small fracture involving the anterior process of the calcaneus.  The tibiotalar joint is maintained. No talar fracture. The cuboid is intact.  Right ankle/foot:  Severely comminuted and depressed central depression type calcaneus fracture. This is much more comminuted and displaced when compared to left-sided fracture. There are numerous small  bone fragments. The medial aspect of the MGL facet is intact. The entire posterior facet is depressed,  rotated and comminuted.  There is air noted in the soft tissues suggesting an open injury. The talus is intact. The tibiotalar joint is maintained. The cuboid is intact.  IMPRESSION: Left ankle/foot: Complex comminuted and displaced calcaneal fractures as described above.  Right ankle/foot: Complex severely comminuted and displaced calcaneal fractures as described above.   Electronically Signed   By: Kalman Jewels M.D.   On: 09/15/2014 16:38   Dg Pelvis Portable  09/15/2014   CLINICAL DATA:  58 year old male with history of trauma after falling 40 feet. Chest pain.  EXAM: PORTABLE PELVIS 1-2 VIEWS  COMPARISON:  No priors.  FINDINGS: AP view of the pelvis demonstrates no definite acute displaced fractures of the bony pelvic ring. Bilateral proximal femurs as visualized appear intact, the femoral heads appear located on this single view examination. Degenerative changes of osteoarthritis are noted in the hip joints bilaterally. Orthopedic fixation hardware in the lower lumbar spine is incompletely visualized.  IMPRESSION: 1. No evidence of significant acute traumatic injury to the bony pelvis.   Electronically Signed   By: Vinnie Langton M.D.   On: 09/15/2014 13:57   Ct Foot Left Wo Contrast  09/15/2014   CLINICAL DATA:  EVALUATE BILATERAL CALCANEUS FRACTURES.  EXAM: CT OF THE RIGHT/ANKLE FOOT WITHOUT CONTRAST. CT OF THE LEFT FOOT/ANKLE WITHOUT CONTRAST.  TECHNIQUE: Multidetector CT imaging of the right foot was performed according to the standard protocol. Multiplanar CT image reconstructions were also generated.  COMPARISON:  Radiographs, same date.  FINDINGS: Left ankle/foot:  Comminuted and displaced calcaneal fractures are demonstrated. This is largely a central depression type fracture. The medial aspects of the posterior and middle facets are intact and the joint spaces are maintained. Laterally the posterior facet is fractured and depressed. There are also non articular fractures involving the  posterior aspect of the calcaneus with a posterior Espinoza shaped vertical split extending out through both the medial and lateral cortices.  There is a small fracture involving the anterior process of the calcaneus.  The tibiotalar joint is maintained. No talar fracture. The cuboid is intact.  Right ankle/foot:  Severely comminuted and depressed central depression type calcaneus fracture. This is much more comminuted and displaced when compared to left-sided fracture. There are numerous small bone fragments. The medial aspect of the MGL facet is intact. The entire posterior facet is depressed, rotated and comminuted.  There is air noted in the soft tissues suggesting an open injury. The talus is intact. The tibiotalar joint is maintained. The cuboid is intact.  IMPRESSION: Left ankle/foot: Complex comminuted and displaced calcaneal fractures as described above.  Right ankle/foot: Complex severely comminuted and displaced calcaneal fractures as described above.   Electronically Signed   By: Kalman Jewels M.D.   On: 09/15/2014 16:38   Ct Foot Right Wo Contrast  09/15/2014   CLINICAL DATA:  EVALUATE BILATERAL CALCANEUS FRACTURES.  EXAM: CT OF THE RIGHT/ANKLE FOOT WITHOUT CONTRAST. CT OF THE LEFT FOOT/ANKLE WITHOUT CONTRAST.  TECHNIQUE: Multidetector CT imaging of the right foot was performed according to the standard protocol. Multiplanar CT image reconstructions were also generated.  COMPARISON:  Radiographs, same date.  FINDINGS: Left ankle/foot:  Comminuted and displaced calcaneal fractures are demonstrated. This is largely a central depression type fracture. The medial aspects of the posterior and middle facets are intact and the joint spaces are maintained. Laterally the posterior facet is fractured and depressed.  There are also non articular fractures involving the posterior aspect of the calcaneus with a posterior Espinoza shaped vertical split extending out through both the medial and lateral cortices.  There is a  small fracture involving the anterior process of the calcaneus.  The tibiotalar joint is maintained. No talar fracture. The cuboid is intact.  Right ankle/foot:  Severely comminuted and depressed central depression type calcaneus fracture. This is much more comminuted and displaced when compared to left-sided fracture. There are numerous small bone fragments. The medial aspect of the MGL facet is intact. The entire posterior facet is depressed, rotated and comminuted.  There is air noted in the soft tissues suggesting an open injury. The talus is intact. The tibiotalar joint is maintained. The cuboid is intact.  IMPRESSION: Left ankle/foot: Complex comminuted and displaced calcaneal fractures as described above.  Right ankle/foot: Complex severely comminuted and displaced calcaneal fractures as described above.   Electronically Signed   By: Kalman Jewels M.D.   On: 09/15/2014 16:38   Ct Ankle Left Wo Contrast  09/15/2014   CLINICAL DATA:  EVALUATE BILATERAL CALCANEUS FRACTURES.  EXAM: CT OF THE RIGHT/ANKLE FOOT WITHOUT CONTRAST. CT OF THE LEFT FOOT/ANKLE WITHOUT CONTRAST.  TECHNIQUE: Multidetector CT imaging of the right foot was performed according to the standard protocol. Multiplanar CT image reconstructions were also generated.  COMPARISON:  Radiographs, same date.  FINDINGS: Left ankle/foot:  Comminuted and displaced calcaneal fractures are demonstrated. This is largely a central depression type fracture. The medial aspects of the posterior and middle facets are intact and the joint spaces are maintained. Laterally the posterior facet is fractured and depressed. There are also non articular fractures involving the posterior aspect of the calcaneus with a posterior Espinoza shaped vertical split extending out through both the medial and lateral cortices.  There is a small fracture involving the anterior process of the calcaneus.  The tibiotalar joint is maintained. No talar fracture. The cuboid is intact.  Right  ankle/foot:  Severely comminuted and depressed central depression type calcaneus fracture. This is much more comminuted and displaced when compared to left-sided fracture. There are numerous small bone fragments. The medial aspect of the MGL facet is intact. The entire posterior facet is depressed, rotated and comminuted.  There is air noted in the soft tissues suggesting an open injury. The talus is intact. The tibiotalar joint is maintained. The cuboid is intact.  IMPRESSION: Left ankle/foot: Complex comminuted and displaced calcaneal fractures as described above.  Right ankle/foot: Complex severely comminuted and displaced calcaneal fractures as described above.   Electronically Signed   By: Kalman Jewels M.D.   On: 09/15/2014 16:38   Dg Chest Port 1 View  09/15/2014   CLINICAL DATA:  Fall 40 feet.  Left chest pain.  EXAM: PORTABLE CHEST - 1 VIEW  COMPARISON:  05/28/2007  FINDINGS: No pneumothorax. Heart and mediastinal contours are within normal limits. No focal opacities or effusions. No acute bony abnormality.  IMPRESSION: No active disease.   Electronically Signed   By: Bradley Espinoza M.D.   On: 09/15/2014 13:58   Dg Foot 2 Views Left  09/15/2014   CLINICAL DATA:  58 year old male who fell 40-50 feet while cutting tree limbs. Initial encounter.  EXAM: LEFT FOOT - 2 VIEW  COMPARISON:  Bilateral foot and ankle CTs 1508 hr today.  FINDINGS: Comminuted calcaneus fracture re - identified with partial collapse of the calcaneus.  Other tarsal bones appear intact. Metatarsals and phalanges appear intact. Distal tibia and  fibula appear intact.  IMPRESSION: Highly comminuted fracture of the left calcaneus. No other left foot fracture identified. See also Left Foot/ankle CT from today reported separately.   Electronically Signed   By: Lars Pinks M.D.   On: 09/15/2014 16:46   Dg Foot 2 Views Right  09/15/2014   CLINICAL DATA:  Right heel pain in laceration after fall of 40 feet.  EXAM: RIGHT FOOT - 2 VIEW   COMPARISON:  None.  FINDINGS: Severely displaced and comminuted fracture is seen involving the anterior and middle portions of the calcaneus. No soft tissue abnormality is noted. No radiopaque foreign body is noted.  IMPRESSION: Severely displaced and comminuted fracture is seen involving the anterior and middle portions of the calcaneus.   Electronically Signed   By: Sabino Dick M.D.   On: 09/15/2014 14:20    Review of Systems  All other systems reviewed and are negative.  Blood pressure 126/82, pulse 79, temperature 97.6 F (36.4 C), temperature source Oral, resp. rate 16, height '6\' 1"'  (1.854 m), weight 81.647 kg (180 lb), SpO2 97 %. Physical Exam On examination patient's both lower extremities are wrapped and elevated to minimize swelling and to minimize risk of fracture blisters. Patient is status post irrigation debridement for open calcaneal fracture on the right. CT scan was reviewed which shows a comminuted posterior facet fractures bilaterally.  Assessment/Plan:  Assessment: Bilateral calcaneal fractures right open.  Plan: We'll have the patient keep his feet elevated and plan for open reduction internal fixation bilateral calcaneal fractures on Sunday. Discussed with the patient that he has to 100% stop smoking. Discussed with smoking he is a risk of necrosis of the skin flap and possible below the knee amputation. Patient states he understands and wishes to proceed with surgery. Discussed the risk of arthritis wound healing and that patient most likely will require short-term rehabilitation stay. Patient will need to be essentially nonweightbearing for both feet for 1 month.   Bradley Espinoza,Bradley Espinoza 09/16/2014, 7:44 AM

## 2014-09-16 NOTE — Progress Notes (Signed)
UR completed.  Jianna Drabik, RN BSN [facility] CCM Trauma/Neuro ICU Case Manager [phone]  

## 2014-09-16 NOTE — Progress Notes (Signed)
Patient ID: Bradley Espinoza, male   DOB: 02-07-1956, 58 y.o.   MRN: [id number] i saw Bradley Espinoza early this morning. He complains of a lot of pain in both feet and in his upper lumbar area. Clinically the strength is normal as well as his sensation. Dtr, nl.  i did review the ct lumbar spine with dr Ellene Route. There is a 50% fracture but the posterior asch as well as the pedicles are intact. The l45,l5s1 is solid fused. In view of his neuro status and his radiological findings we are going to proceed with a conservative approach . Will get a TLSO and pt/ot help. Will monitor and if we see some changes for the worse will go ahead with t12 to l4 fusion. Brace ordered.

## 2014-09-16 NOTE — Progress Notes (Signed)
PT Cancellation Note  Patient Details Name: DEMARQUS JOCSON MRN: [id number] DOB: Jan 29, 1956   Cancelled Treatment:    Reason Eval/Treat Not Completed: Medical issues which prohibited therapy (pt with L2 fx not yet addressed on bedrest with orders only for log roll. will check on tomorrow after neurosurgery has seen)   Lanetta Inch Beth 09/16/2014, 8:49 AM Elwyn Reach, [place]
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
## 2014-09-16 NOTE — Progress Notes (Signed)
Pt. Continues to bleed through compression dressing on right heel.  Dressing reinforced foot elevated.  Numbness and tingling continues in legs bilaterally.  Physicians aware no new orders.  Pt. Continues to raise his head and kick his legs reinforced importance of patient limiting his movement and protecting his spine.  Will continue to monitor.

## 2014-09-17 ENCOUNTER — Encounter (HOSPITAL_COMMUNITY): Payer: Self-pay | Admitting: Orthopedic Surgery

## 2014-09-17 LAB — CBC
HCT: 35.3 % — ABNORMAL LOW (ref 39.0–52.0)
Hemoglobin: 11.5 g/dL — ABNORMAL LOW (ref 13.0–17.0)
MCH: 30.2 pg (ref 26.0–34.0)
MCHC: 32.6 g/dL (ref 30.0–36.0)
MCV: 92.7 fL (ref 78.0–100.0)
PLATELETS: 138 10*3/uL — AB (ref 150–400)
RBC: 3.81 MIL/uL — ABNORMAL LOW (ref 4.22–5.81)
RDW: 12.7 % (ref 11.5–15.5)
WBC: 7.5 10*3/uL (ref 4.0–10.5)

## 2014-09-17 MED ORDER — TRAMADOL HCL 50 MG PO TABS
50.0000 mg | ORAL_TABLET | Freq: Four times a day (QID) | ORAL | Status: DC
  Administered 2014-09-17 – 2014-09-18 (×5): 50 mg via ORAL
  Filled 2014-09-17 (×5): qty 1

## 2014-09-17 NOTE — Progress Notes (Signed)
PT Cancellation Note  Patient Details Name: Bradley Espinoza MRN: [id number] DOB: 21-Sep-1956   Cancelled Treatment:    Reason Eval/Treat Not Completed: Patient not medically ready.  Pt currently with order for bedrest and log roll only.  Please advance activity order, so that when pt does receive TLSO pt able to mobilize.  Please clarify order for don/doff TLSO supine or sitting.  Thanks.     Judithe Keetch, Thornton Papas 09/17/2014, 12:00 PM

## 2014-09-17 NOTE — Progress Notes (Signed)
Patient to transfer to 5N24 report given to receiving nurse Selena Batten all questions answered at this time.  Patient stable at transfer.

## 2014-09-17 NOTE — Progress Notes (Signed)
Patient ID: Bradley Espinoza, male   DOB: 1955/11/23, 58 y.o.   MRN: [id number] [name]. C/o of oain mostly in both feet. No weakness. TLSO pending.pt /ot to see

## 2014-09-17 NOTE — Progress Notes (Signed)
Patient ID: Bradley Espinoza, male   DOB: Nov 09, 1955, 58 y.o.   MRN: [id number] 2 Days Post-Op  Subjective: B thigh pain, lower back pain, intermittent heel pain  Objective: Vital signs in last 24 hours: Temp:  [98 F (36.7 C)-98.4 F (36.9 C)] 98.3 F (36.8 C) (12/11 0700) Pulse Rate:  [58-85] 73 (12/11 0700) Resp:  [18-25] 22 (12/11 0800) BP: (125-155)/(55-70) 125/60 mmHg (12/11 0329) SpO2:  [96 %-100 %] 97 % (12/11 0800)    Intake/Output from previous day: 12/10 0701 - 12/11 0700 In: 1537.5 [I.V.:1437.5; IV Piggyback:100] Out: 1800 [Urine:1800] Intake/Output this shift:    General appearance: alert and cooperative Resp: clear to auscultation bilaterally Cardio: regular rate and rhythm GI: abnormal findings:  none - soft and NT Extremities: B foot splints, toes warm  Lab Results: CBC   Recent Labs  09/16/14 0422 09/17/14 0309  WBC 7.7 7.5  HGB 12.6* 11.5*  HCT 37.2* 35.3*  PLT 144* 138*   BMET  Recent Labs  09/15/14 1315 09/15/14 1327 09/16/14 0422  NA 141 141 135*  K 4.3 4.1 4.1  CL 104 104 99  CO2 24  --  22  GLUCOSE 103* 103* 124*  BUN 19 20 13   CREATININE 0.94 1.00 0.72  CALCIUM 9.2  --  8.6   Anti-infectives: Anti-infectives    Start     Dose/Rate Route Frequency Ordered Stop   09/16/14 0200  ceFAZolin (ANCEF) IVPB 2 g/50 mL premix     2 g100 mL/hr over 30 Minutes Intravenous Every 6 hours 09/16/14 0024 09/19/14 0159   09/15/14 1345  ceFAZolin (ANCEF) IVPB 1 g/50 mL premix     1 g100 mL/hr over 30 Minutes Intravenous  Once 09/15/14 1340 09/15/14 1618      Assessment/Plan: Fall L2 fx -- mobilize in TLSO per Dr. Joya Salm Bilateral calcaneal fxs, right open s/p I&D, ex fix -- For bilateral ORIF Sunday by Dr. Sharol Given, NWB x 1 month minimum ABL anemia -- Mild, follow in AM Chronic pain -- see below FEN -- on home Oxycontin, muscle relaxer, Lyrica. Add scheduled tramadol VTE -- SCD's, Lovenox Dispo -- to floor  LOS: 2 days    Georganna Skeans, MD, MPH, FACS Trauma: [phone] General Surgery: [phone]  09/17/2014

## 2014-09-17 NOTE — Progress Notes (Signed)
Patient ID: Bradley Espinoza, male   DOB: [birth year], 58 y.o.   MRN: [id number] Stable. TLSO pending. To have feet surgery this Sunday. Showed him the ct of the lumbar spine.

## 2014-09-17 NOTE — Progress Notes (Signed)
OT Cancellation Note  Patient Details Name: Bradley Espinoza MRN: [id number] DOB: 10/02/1956   Cancelled Treatment:    Reason Eval/Treat Not Completed: Other (comment). TLSO has not arrived yet.  Almon Register [id number] 09/17/2014, 7:50 AM

## 2014-09-18 LAB — CBC
HEMATOCRIT: 35.9 % — AB (ref 39.0–52.0)
HEMOGLOBIN: 11.9 g/dL — AB (ref 13.0–17.0)
MCH: 30.4 pg (ref 26.0–34.0)
MCHC: 33.1 g/dL (ref 30.0–36.0)
MCV: 91.8 fL (ref 78.0–100.0)
Platelets: 145 10*3/uL — ABNORMAL LOW (ref 150–400)
RBC: 3.91 MIL/uL — ABNORMAL LOW (ref 4.22–5.81)
RDW: 12.5 % (ref 11.5–15.5)
WBC: 7.3 10*3/uL (ref 4.0–10.5)

## 2014-09-18 MED ORDER — CHLORHEXIDINE GLUCONATE 4 % EX LIQD
60.0000 mL | Freq: Once | CUTANEOUS | Status: DC
  Filled 2014-09-18: qty 60

## 2014-09-18 MED ORDER — OXYCODONE HCL ER 20 MG PO T12A
20.0000 mg | EXTENDED_RELEASE_TABLET | Freq: Two times a day (BID) | ORAL | Status: DC
  Administered 2014-09-18 – 2014-09-22 (×8): 20 mg via ORAL
  Filled 2014-09-18 (×3): qty 2
  Filled 2014-09-18: qty 1
  Filled 2014-09-18: qty 2
  Filled 2014-09-18: qty 1
  Filled 2014-09-18 (×2): qty 2

## 2014-09-18 MED ORDER — TRAMADOL HCL 50 MG PO TABS
100.0000 mg | ORAL_TABLET | Freq: Four times a day (QID) | ORAL | Status: DC
Start: 2014-09-18 — End: 2014-09-22
  Administered 2014-09-18 – 2014-09-22 (×16): 100 mg via ORAL
  Filled 2014-09-18 (×16): qty 2

## 2014-09-18 MED ORDER — OXYCODONE HCL ER 15 MG PO T12A: 30.0000 mg | EXTENDED_RELEASE_TABLET | Freq: Two times a day (BID) | ORAL | Status: DC

## 2014-09-18 MED ORDER — ENSURE COMPLETE PO LIQD
237.0000 mL | Freq: Three times a day (TID) | ORAL | Status: DC
  Administered 2014-09-20 – 2014-09-21 (×6): 237 mL via ORAL

## 2014-09-18 MED ORDER — CEFAZOLIN SODIUM-DEXTROSE 2-3 GM-% IV SOLR
2.0000 g | INTRAVENOUS | Status: DC
  Filled 2014-09-18: qty 50

## 2014-09-18 NOTE — Progress Notes (Signed)
[facility] Trauma Service  Progress Note   LOS: 3 days   Subjective: Pt says the pain meds don't last long enough.  He says he has chronic pain, he says he takes oxycodone 30mg  BID and percocet every 4 hours prn since his 3 back surgeries.  No N/V, tolerating diet, but anorexic.  Pending surgery.    Objective: Vital signs in last 24 hours: Temp:  [98.4 F (36.9 C)-98.6 F (37 C)] 98.6 F (37 C) (12/12 0549) Pulse Rate:  [72-79] 79 (12/12 0549) Resp:  [12-16] 14 (12/12 0800) BP: (139-144)/(60-72) 139/72 mmHg (12/12 0549) SpO2:  [90 %-98 %] 97 % (12/12 0800) Last BM Date:  (pta)  Lab Results:  CBC  Recent Labs  09/17/14 0309 09/18/14 0444  WBC 7.5 7.3  HGB 11.5* 11.9*  HCT 35.3* 35.9*  PLT 138* 145*   BMET  Recent Labs  09/15/14 1315 09/15/14 1327 09/16/14 0422  NA 141 141 135*  K 4.3 4.1 4.1  CL 104 104 99  CO2 24  --  22  GLUCOSE 103* 103* 124*  BUN 19 20 13   CREATININE 0.94 1.00 0.72  CALCIUM 9.2  --  8.6    Imaging: No results found.   PE: General: pleasant, WD/WN white male who is laying in bed in NAD HEENT: head is normocephalic, atraumatic.  Sclera are noninjected.  PERRL.  Ears and nose without any masses or lesions.  Mouth is pink and moist Heart: regular, rate, and rhythm.  Normal s1,s2. No obvious murmurs, gallops, or rubs noted.  Palpable radial and pedal pulses bilaterally Lungs: CTAB, no wheezes, rhonchi, or rales noted.  Respiratory effort nonlabored Abd: soft, NT/ND, +BS, no masses, hernias, or organomegaly MS: B/L LE in splints and ace wraps, distal CSM intact to all 4 extremities Skin: warm and dry with no masses, lesions, or rashes Psych: A&Ox3 with an appropriate affect.   Assessment/Plan: Fall off ladder - trimming trees L2 fx -- mobilize in TLSO per Dr. Joya Salm Bilateral calcaneal fxs, right open s/p I&D, ex fix -- For bilateral ORIF Sunday by Dr. Sharol Given, B/L LE NWB x 1 month minimum ABL anemia -- stabilizing and  improving Chronic pain -- see below - 3 back surgeries FEN -- the patient stated he takes Oxycontin 30mg  BID, but I checked with [facility] in [place] and he only gets 15mg  BID, so the 20mg  BID will suffice for now, robaxin, Lyrica, Increase scheduled tramadol to 100mg , add ice VTE -- SCD's, Lovenox Dispo -- on floor, OR Sunday with Dr. Janeann Merl, PA-C Pager: [phone] General Trauma PA Pager: [phone]   09/18/2014

## 2014-09-18 NOTE — Progress Notes (Signed)
Orthopedic Tech Progress Note Patient Details:  Bradley Espinoza 10-21-1955 [id number]  Patient ID: [name], male   DOB: 11-29-1955, 58 y.o.   MRN: [id number] Called in bio-tech brace order; spoke with Dillon Bjork, Felder Lebeda 09/18/2014, 12:15 PM

## 2014-09-18 NOTE — Progress Notes (Signed)
PT Cancellation Note  Patient Details Name: Bradley Espinoza MRN: [id number] DOB: [birth year]   Cancelled Treatment:    Reason Eval/Treat Not Completed: Pain limiting ability to participate;Patient not medically ready TLSO arrived this afternoon, patient limited by pain, unable to participate today.  Bradley Espinoza, Bradley Espinoza 09/18/2014, 5:38 PM

## 2014-09-18 NOTE — Progress Notes (Signed)
No issues overnight. Pt reports no changes. Still has foot pain, and back pain when trying to sit up.  EXAM:  BP 139/72 mmHg  Pulse 79  Temp(Src) 98.6 F (37 C) (Oral)  Resp 12  Ht 6\' 1"  (1.854 m)  Wt 81.647 kg (180 lb)  BMI 23.75 kg/m2  SpO2 98%  Awake, alert, oriented  Speech fluent, appropriate  CN grossly intact  Good proximal BLE strength. Wiggles toes. Both feet in casts.  IMPRESSION:  58 y.o. male s/p fall with multiple prior surgeries and L2 fx s/p fall, treating conservatively for now. Remains neurologically intact  PLAN: - OR tomorrow for foot surgery - TLSO brace when able to get up

## 2014-09-18 NOTE — Progress Notes (Signed)
Orthopedic Tech Progress Note Patient Details:  Bradley Espinoza 07-08-1956 [id number]  Patient ID: [name], male   DOB: 1956-04-20, 58 y.o.   MRN: [id number] Brace order completed by Warnell Forester, Shaliyah Taite 09/18/2014, 1:20 PM

## 2014-09-19 ENCOUNTER — Encounter (HOSPITAL_COMMUNITY): Payer: Self-pay | Admitting: Certified Registered"

## 2014-09-19 ENCOUNTER — Inpatient Hospital Stay (HOSPITAL_COMMUNITY): Payer: 59 | Admitting: Certified Registered"

## 2014-09-19 ENCOUNTER — Encounter (HOSPITAL_COMMUNITY): Admission: EM | Disposition: A | Discharge status: Skilled Nursing Facility | Payer: 59 | Source: Home / Self Care

## 2014-09-19 HISTORY — PX: ORIF CALCANEOUS FRACTURE: SHX5030

## 2014-09-19 SURGERY — OPEN REDUCTION INTERNAL FIXATION (ORIF) CALCANEOUS FRACTURE
Anesthesia: General | Site: Ankle | Laterality: Right

## 2014-09-19 MED ORDER — DEXAMETHASONE SODIUM PHOSPHATE 4 MG/ML IJ SOLN
INTRAMUSCULAR | Status: AC
  Filled 2014-09-19: qty 2

## 2014-09-19 MED ORDER — LIDOCAINE HCL (CARDIAC) 20 MG/ML IV SOLN
INTRAVENOUS | Status: AC
  Filled 2014-09-19: qty 5

## 2014-09-19 MED ORDER — CEFAZOLIN SODIUM-DEXTROSE 2-3 GM-% IV SOLR
INTRAVENOUS | Status: DC | PRN
  Administered 2014-09-19: 2 g via INTRAVENOUS

## 2014-09-19 MED ORDER — HYDROMORPHONE HCL 1 MG/ML IJ SOLN: 0.5000 mg | INTRAMUSCULAR | Status: DC | PRN

## 2014-09-19 MED ORDER — OXYCODONE-ACETAMINOPHEN 5-325 MG PO TABS
ORAL_TABLET | ORAL | Status: AC
  Administered 2014-09-19: 2 via ORAL
  Filled 2014-09-19: qty 2

## 2014-09-19 MED ORDER — METHOCARBAMOL 500 MG PO TABS
ORAL_TABLET | ORAL | Status: AC
  Administered 2014-09-19: 500 mg via ORAL
  Filled 2014-09-19: qty 1

## 2014-09-19 MED ORDER — PROPOFOL 10 MG/ML IV BOLUS
INTRAVENOUS | Status: DC | PRN
  Administered 2014-09-19: 120 mg via INTRAVENOUS

## 2014-09-19 MED ORDER — MIDAZOLAM HCL 2 MG/2ML IJ SOLN
INTRAMUSCULAR | Status: AC
  Filled 2014-09-19: qty 2

## 2014-09-19 MED ORDER — CEFAZOLIN SODIUM 1-5 GM-% IV SOLN
1.0000 g | Freq: Four times a day (QID) | INTRAVENOUS | Status: AC
  Administered 2014-09-19 – 2014-09-20 (×3): 1 g via INTRAVENOUS
  Filled 2014-09-19 (×3): qty 50

## 2014-09-19 MED ORDER — ONDANSETRON HCL 4 MG PO TABS
4.0000 mg | ORAL_TABLET | Freq: Four times a day (QID) | ORAL | Status: DC | PRN
  Administered 2014-09-22: 4 mg via ORAL
  Filled 2014-09-19: qty 1

## 2014-09-19 MED ORDER — MIDAZOLAM HCL 5 MG/5ML IJ SOLN
INTRAMUSCULAR | Status: DC | PRN
  Administered 2014-09-19: 2 mg via INTRAVENOUS

## 2014-09-19 MED ORDER — NEOSTIGMINE METHYLSULFATE 10 MG/10ML IV SOLN
INTRAVENOUS | Status: AC
  Filled 2014-09-19: qty 1

## 2014-09-19 MED ORDER — PHENYLEPHRINE 40 MCG/ML (10ML) SYRINGE FOR IV PUSH (FOR BLOOD PRESSURE SUPPORT)
PREFILLED_SYRINGE | INTRAVENOUS | Status: AC
  Filled 2014-09-19: qty 10

## 2014-09-19 MED ORDER — DEXAMETHASONE SODIUM PHOSPHATE 4 MG/ML IJ SOLN
INTRAMUSCULAR | Status: AC
  Filled 2014-09-19: qty 1

## 2014-09-19 MED ORDER — METOCLOPRAMIDE HCL 10 MG PO TABS: 5.0000 mg | ORAL_TABLET | Freq: Three times a day (TID) | ORAL | Status: DC | PRN

## 2014-09-19 MED ORDER — FENTANYL CITRATE 0.05 MG/ML IJ SOLN
INTRAMUSCULAR | Status: AC
  Filled 2014-09-19: qty 5

## 2014-09-19 MED ORDER — EPHEDRINE SULFATE 50 MG/ML IJ SOLN
INTRAMUSCULAR | Status: AC
  Filled 2014-09-19: qty 1

## 2014-09-19 MED ORDER — OXYCODONE-ACETAMINOPHEN 5-325 MG PO TABS
1.0000 | ORAL_TABLET | ORAL | Status: DC | PRN
  Administered 2014-09-19 – 2014-09-20 (×4): 2 via ORAL
  Filled 2014-09-19 (×3): qty 2

## 2014-09-19 MED ORDER — HYDROMORPHONE HCL 1 MG/ML IJ SOLN
0.2500 mg | INTRAMUSCULAR | Status: DC | PRN
  Administered 2014-09-19 (×4): 0.5 mg via INTRAVENOUS

## 2014-09-19 MED ORDER — SUCCINYLCHOLINE CHLORIDE 20 MG/ML IJ SOLN
INTRAMUSCULAR | Status: AC
  Filled 2014-09-19: qty 1

## 2014-09-19 MED ORDER — CEFAZOLIN SODIUM-DEXTROSE 2-3 GM-% IV SOLR
INTRAVENOUS | Status: AC
  Filled 2014-09-19: qty 50

## 2014-09-19 MED ORDER — ONDANSETRON HCL 4 MG/2ML IJ SOLN: 4.0000 mg | Freq: Four times a day (QID) | INTRAMUSCULAR | Status: DC | PRN

## 2014-09-19 MED ORDER — METHOCARBAMOL 1000 MG/10ML IJ SOLN: 500.0000 mg | Freq: Four times a day (QID) | INTRAVENOUS | Status: DC | PRN

## 2014-09-19 MED ORDER — HYDROMORPHONE HCL 1 MG/ML IJ SOLN
INTRAMUSCULAR | Status: AC
  Administered 2014-09-19: 0.5 mg via INTRAVENOUS
  Filled 2014-09-19: qty 2

## 2014-09-19 MED ORDER — GLYCOPYRROLATE 0.2 MG/ML IJ SOLN
INTRAMUSCULAR | Status: AC
  Filled 2014-09-19: qty 2

## 2014-09-19 MED ORDER — ROCURONIUM BROMIDE 100 MG/10ML IV SOLN
INTRAVENOUS | Status: DC | PRN
  Administered 2014-09-19: 50 mg via INTRAVENOUS

## 2014-09-19 MED ORDER — DEXAMETHASONE SODIUM PHOSPHATE 4 MG/ML IJ SOLN
INTRAMUSCULAR | Status: DC | PRN
Start: 2014-09-19 — End: 2014-09-19
  Administered 2014-09-19: 8 mg via INTRAVENOUS

## 2014-09-19 MED ORDER — ONDANSETRON HCL 4 MG/2ML IJ SOLN
INTRAMUSCULAR | Status: AC
  Filled 2014-09-19: qty 2

## 2014-09-19 MED ORDER — SODIUM CHLORIDE 0.9 % IJ SOLN
INTRAMUSCULAR | Status: AC
  Filled 2014-09-19: qty 10

## 2014-09-19 MED ORDER — FENTANYL CITRATE 0.05 MG/ML IJ SOLN
INTRAMUSCULAR | Status: DC | PRN
  Administered 2014-09-19 (×5): 50 ug via INTRAVENOUS
  Administered 2014-09-19: 200 ug via INTRAVENOUS
  Administered 2014-09-19: 50 ug via INTRAVENOUS

## 2014-09-19 MED ORDER — METOCLOPRAMIDE HCL 5 MG/ML IJ SOLN: 5.0000 mg | Freq: Three times a day (TID) | INTRAMUSCULAR | Status: DC | PRN

## 2014-09-19 MED ORDER — LIDOCAINE HCL (CARDIAC) 20 MG/ML IV SOLN
INTRAVENOUS | Status: DC | PRN
  Administered 2014-09-19: 50 mg via INTRAVENOUS

## 2014-09-19 MED ORDER — METHOCARBAMOL 500 MG PO TABS
500.0000 mg | ORAL_TABLET | Freq: Four times a day (QID) | ORAL | Status: DC | PRN
  Administered 2014-09-19 – 2014-09-20 (×2): 500 mg via ORAL
  Filled 2014-09-19: qty 1

## 2014-09-19 MED ORDER — GLYCOPYRROLATE 0.2 MG/ML IJ SOLN
INTRAMUSCULAR | Status: DC | PRN
  Administered 2014-09-19: 0.4 mg via INTRAVENOUS

## 2014-09-19 MED ORDER — PROPOFOL 10 MG/ML IV BOLUS
INTRAVENOUS | Status: AC
  Filled 2014-09-19: qty 20

## 2014-09-19 MED ORDER — NEOSTIGMINE METHYLSULFATE 10 MG/10ML IV SOLN
INTRAVENOUS | Status: DC | PRN
Start: 2014-09-19 — End: 2014-09-19
  Administered 2014-09-19: 3 mg via INTRAVENOUS

## 2014-09-19 MED ORDER — ONDANSETRON HCL 4 MG/2ML IJ SOLN
INTRAMUSCULAR | Status: DC | PRN
  Administered 2014-09-19: 4 mg via INTRAVENOUS

## 2014-09-19 MED ORDER — DOCUSATE SODIUM 100 MG PO CAPS
100.0000 mg | ORAL_CAPSULE | Freq: Two times a day (BID) | ORAL | Status: DC
  Administered 2014-09-19 – 2014-09-21 (×6): 100 mg via ORAL
  Filled 2014-09-19 (×8): qty 1

## 2014-09-19 MED ORDER — ROCURONIUM BROMIDE 50 MG/5ML IV SOLN
INTRAVENOUS | Status: AC
  Filled 2014-09-19: qty 1

## 2014-09-19 MED ORDER — SODIUM CHLORIDE 0.9 % IV SOLN: INTRAVENOUS | Status: DC

## 2014-09-19 MED ORDER — LACTATED RINGERS IV SOLN
INTRAVENOUS | Status: DC | PRN
Start: 2014-09-19 — End: 2014-09-19
  Administered 2014-09-19 (×2): via INTRAVENOUS

## 2014-09-19 SURGICAL SUPPLY — 56 items
BANDAGE ELASTIC 4 VELCRO ST LF (GAUZE/BANDAGES/DRESSINGS) IMPLANT
BANDAGE ELASTIC 6 VELCRO ST LF (GAUZE/BANDAGES/DRESSINGS) IMPLANT
BANDAGE ESMARK 6X9 LF (GAUZE/BANDAGES/DRESSINGS) ×2 IMPLANT
BIT DRILL LCP QC 2X140 (BIT) ×6 IMPLANT
BNDG COHESIVE 4X5 TAN STRL (GAUZE/BANDAGES/DRESSINGS) ×6 IMPLANT
BNDG COHESIVE 6X5 TAN STRL LF (GAUZE/BANDAGES/DRESSINGS) ×12 IMPLANT
BNDG ESMARK 6X9 LF (GAUZE/BANDAGES/DRESSINGS) ×3
BNDG GAUZE ELAST 4 BULKY (GAUZE/BANDAGES/DRESSINGS) ×6 IMPLANT
BNDG GAUZE STRTCH 6 (GAUZE/BANDAGES/DRESSINGS) ×18 IMPLANT
COTTON STERILE ROLL (GAUZE/BANDAGES/DRESSINGS) ×6 IMPLANT
COVER SURGICAL LIGHT HANDLE (MISCELLANEOUS) ×9 IMPLANT
CUFF TOURNIQUET SINGLE 34IN LL (TOURNIQUET CUFF) IMPLANT
CUFF TOURNIQUET SINGLE 44IN (TOURNIQUET CUFF) ×3 IMPLANT
DRAPE INCISE IOBAN 66X45 STRL (DRAPES) ×9 IMPLANT
DRAPE OEC MINIVIEW 54X84 (DRAPES) ×6 IMPLANT
DRAPE PROXIMA HALF (DRAPES) ×9 IMPLANT
DRAPE U-SHAPE 47X51 STRL (DRAPES) ×9 IMPLANT
DRSG ADAPTIC 3X8 NADH LF (GAUZE/BANDAGES/DRESSINGS) ×6 IMPLANT
DRSG PAD ABDOMINAL 8X10 ST (GAUZE/BANDAGES/DRESSINGS) ×6 IMPLANT
DURAPREP 26ML APPLICATOR (WOUND CARE) ×9 IMPLANT
ELECT REM PT RETURN 9FT ADLT (ELECTROSURGICAL) ×6
ELECTRODE REM PT RTRN 9FT ADLT (ELECTROSURGICAL) ×4 IMPLANT
GAUZE SPONGE 4X4 12PLY STRL (GAUZE/BANDAGES/DRESSINGS) ×3 IMPLANT
GLOVE BIOGEL PI IND STRL 9 (GLOVE) ×6 IMPLANT
GLOVE BIOGEL PI INDICATOR 9 (GLOVE) ×3
GLOVE SURG ORTHO 9.0 STRL STRW (GLOVE) ×9 IMPLANT
GOWN STRL REUS W/ TWL XL LVL3 (GOWN DISPOSABLE) ×14 IMPLANT
GOWN STRL REUS W/TWL XL LVL3 (GOWN DISPOSABLE) ×7
JOYSTICK REDUCTION 5.0 (MISCELLANEOUS) ×3 IMPLANT
KIT BASIN OR (CUSTOM PROCEDURE TRAY) ×6 IMPLANT
KIT ROOM TURNOVER OR (KITS) ×6 IMPLANT
MANIFOLD NEPTUNE II (INSTRUMENTS) ×6 IMPLANT
NS IRRIG 1000ML POUR BTL (IV SOLUTION) ×6 IMPLANT
PACK ORTHO EXTREMITY (CUSTOM PROCEDURE TRAY) ×6 IMPLANT
PAD ARMBOARD 7.5X6 YLW CONV (MISCELLANEOUS) ×12 IMPLANT
PADDING CAST COTTON 6X4 STRL (CAST SUPPLIES) ×6 IMPLANT
PLATE CALCANEAL LOCK LT VA 2.7 (Plate) ×3 IMPLANT
PLATE CALCANEAL LOCK RT VA 2.7 (Plate) ×3 IMPLANT
PUTTY BONE DBX 5CC MIX (Putty) ×6 IMPLANT
SCREW METAPHYSCAL 34MM (Screw) ×12 IMPLANT
SCREW METAPHYSEAL 2.7X24MM (Screw) ×3 IMPLANT
SCREW METAPHYSEAL 2.7X28 (Screw) ×12 IMPLANT
SCREW METAPHYSEAL 2.7X30 (Screw) ×6 IMPLANT
SCREW METAPHYSEAL 2.7X32MM (Screw) ×9 IMPLANT
SCREW METAPHYSEAL 2.7X42MM (Screw) ×6 IMPLANT
SCREW METAPHYSEAL 2.7X50 (Screw) ×3 IMPLANT
SPONGE GAUZE 4X4 12PLY STER LF (GAUZE/BANDAGES/DRESSINGS) ×3 IMPLANT
SPONGE LAP 18X18 X RAY DECT (DISPOSABLE) ×9 IMPLANT
STAPLER VISISTAT 35W (STAPLE) IMPLANT
SUCTION FRAZIER TIP 10 FR DISP (SUCTIONS) ×3 IMPLANT
SUT ETHILON 2 0 PSLX (SUTURE) ×6 IMPLANT
SUT VIC AB 2-0 CTB1 (SUTURE) ×12 IMPLANT
TOWEL OR 17X24 6PK STRL BLUE (TOWEL DISPOSABLE) ×6 IMPLANT
TOWEL OR 17X26 10 PK STRL BLUE (TOWEL DISPOSABLE) ×6 IMPLANT
TUBE CONNECTING 12X1/4 (SUCTIONS) ×6 IMPLANT
WATER STERILE IRR 1000ML POUR (IV SOLUTION) ×6 IMPLANT

## 2014-09-19 NOTE — Anesthesia Preprocedure Evaluation (Signed)
Anesthesia Evaluation  Patient identified by MRN, date of birth, ID band Patient awake    Reviewed: Allergy & Precautions, H&P , NPO status , Patient's Chart, lab work & pertinent test results  Airway Mallampati: II  TM Distance: >3 FB Neck ROM: Full    Dental no notable dental hx. (+) Partial Upper, Dental Advisory Given   Pulmonary Current Smoker,  breath sounds clear to auscultation  Pulmonary exam normal       Cardiovascular negative cardio ROS  Rhythm:Regular Rate:Normal     Neuro/Psych  Headaches, Anxiety Depression    GI/Hepatic negative GI ROS, Neg liver ROS,   Endo/Other  negative endocrine ROS  Renal/GU negative Renal ROS  negative genitourinary   Musculoskeletal  (+) Arthritis -, Osteoarthritis,    Abdominal   Peds  Hematology negative hematology ROS (+)   Anesthesia Other Findings   Reproductive/Obstetrics negative OB ROS                             Anesthesia Physical Anesthesia Plan  ASA: II  Anesthesia Plan: General   Post-op Pain Management:    Induction: Intravenous  Airway Management Planned: Oral ETT  Additional Equipment:   Intra-op Plan:   Post-operative Plan: Extubation in OR  Informed Consent: I have reviewed the patients History and Physical, chart, labs and discussed the procedure including the risks, benefits and alternatives for the proposed anesthesia with the patient or authorized representative who has indicated his/her understanding and acceptance.   Dental advisory given  Plan Discussed with: CRNA  Anesthesia Plan Comments:         Anesthesia Quick Evaluation

## 2014-09-19 NOTE — Anesthesia Postprocedure Evaluation (Signed)
  Anesthesia Post-op Note  Patient: Bradley Espinoza  Procedure(s) Performed: Procedure(s): OPEN REDUCTION INTERNAL FIXATION (ORIF) CALCANEOUS FRACTURE (Right) OPEN REDUCTION INTERNAL FIXATION (ORIF) CALCANEOUS FRACTURE (Left)  Patient Location: PACU  Anesthesia Type:General  Level of Consciousness: awake and alert   Airway and Oxygen Therapy: Patient Spontanous Breathing  Post-op Pain: moderate  Post-op Assessment: Post-op Vital signs reviewed, Patient's Cardiovascular Status Stable and Respiratory Function Stable  Post-op Vital Signs: Reviewed  Filed Vitals:   09/19/14 1100  BP: 137/67  Pulse: 94  Temp:   Resp: 15    Complications: No apparent anesthesia complications

## 2014-09-19 NOTE — Progress Notes (Signed)
OT Cancellation Note  Patient Details Name: Bradley Espinoza MRN: [id number] DOB: May 31, 1956   Cancelled Treatment:     Pt off floor in OR this AM. OT will need updated orders following surgery.   Villa Herb M 09/19/2014, 7:38 AM   Cyndie Chime, OTR/L Occupational Therapist [phone] (pager)

## 2014-09-19 NOTE — Progress Notes (Signed)
Patient ID: Bradley Espinoza, male   DOB: April 23, 1956, 58 y.o.   MRN: [id number]  General Surgery - [facility] - Progress Note  Subjective: Patient just back to room from OR for calcaneal fractures.  Complains of back pain.  Objective: Vital signs in last 24 hours: Temp:  [97.8 F (36.6 C)-98.7 F (37.1 C)] 97.8 F (36.6 C) (12/13 1034) Pulse Rate:  [77-94] 94 (12/13 1100) Resp:  [13-18] 15 (12/13 1100) BP: (126-171)/(65-84) 137/67 mmHg (12/13 1100) SpO2:  [92 %-98 %] 97 % (12/13 1100) Last BM Date: 09/17/14  Intake/Output from previous day: 12/12 0701 - 12/13 0700 In: 840 [P.O.:600; I.V.:240] Out: 1000 [Urine:1000]  Exam: HEENT - clear, not icteric Neck - soft Chest - clear bilaterally Cor - RRR, no murmur Abd - soft without distension; BS present; non-tender Ext - fresh OR dressings on bilat feet - ice packs Neuro - grossly intact, no gross focal deficits  Lab Results:   Recent Labs  09/17/14 0309 09/18/14 0444  WBC 7.5 7.3  HGB 11.5* 11.9*  HCT 35.3* 35.9*  PLT 138* 145*    No results for input(s): NA, K, CL, CO2, GLUCOSE, BUN, CREATININE, CALCIUM in the last 72 hours.  Studies/Results: No results found.  Assessment / Plan: Fall off ladder - trimming trees L2 fx -- mobilize in TLSO per Dr. Joya Salm Bilateral calcaneal fxs, right open s/p I&D, ex fix -- bilateral ORIF by Dr. Sharol Given just completed; B/L LE NWB x 1 month minimum ABL anemia -- stabilizing and improving Chronic pain -- previous back surgery FEN -- the patient stated he takes Oxycontin 30mg  BID, but I checked with [facility] in [place] and he only gets 15mg  BID, so the 20mg  BID will suffice for now, robaxin, Lyrica, Increase scheduled tramadol to 100mg , add ice VTE -- SCD's, Lovenox   Earnstine Regal, MD, [facility] Office: [phone]  09/19/2014

## 2014-09-19 NOTE — H&P (View-Only) (Signed)
Reason for Consult: Bilateral calcaneal fractures Referring Physician: Dr. [name] is an 58 y.o. male.  HPI: Patient is a 58 year old gentleman who states that he was up on a ladder approximately 30 feet when he was trimming a tree branch with a chain saw when the branch snapped he states he was thrown 10 feet in the air and landed on both feet. Patient states he had immediate onset of lower back pain. Patient is status post lumbar spine fusion. Patient is a smoker.  Past Medical History  Diagnosis Date  . Anxiety   . Depression   . Headache(784.0)   . Arthritis     Past Surgical History  Procedure Laterality Date  . Back surgery  2004  . Cervical spine surgery  2008  . Inguinal hernia repair Bilateral     Family History  Problem Relation Age of Onset  . Diabetes Father   . Cancer Other     Social History:  reports that he has been smoking Cigarettes.  He started smoking about 37 years ago. He has a 10 pack-year smoking history. He has never used smokeless tobacco. He reports that he does not drink alcohol or use illicit drugs.  Allergies:  Allergies  Allergen Reactions  . Codeine Nausea And Vomiting    Medications: I have reviewed the patient's current medications.  Results for orders placed or performed during the hospital encounter of 09/15/14 (from the past 48 hour(s))  Comprehensive metabolic panel     Status: Abnormal   Collection Time: 09/15/14  1:15 PM  Result Value Ref Range   Sodium 141 137 - 147 mEq/L   Potassium 4.3 3.7 - 5.3 mEq/L   Chloride 104 96 - 112 mEq/L   CO2 24 19 - 32 mEq/L   Glucose, Bld 103 (H) 70 - 99 mg/dL   BUN 19 6 - 23 mg/dL   Creatinine, Ser 0.94 0.50 - 1.35 mg/dL   Calcium 9.2 8.4 - 10.5 mg/dL   Total Protein 7.0 6.0 - 8.3 g/dL   Albumin 4.0 3.5 - 5.2 g/dL   AST 23 0 - 37 U/L   ALT 24 0 - 53 U/L   Alkaline Phosphatase 80 39 - 117 U/L   Total Bilirubin 0.7 0.3 - 1.2 mg/dL   GFR calc non Af Amer 90 (L) >90 mL/min    GFR calc Af Amer >90 >90 mL/min    Comment: (NOTE) The eGFR has been calculated using the CKD EPI equation. This calculation has not been validated in all clinical situations. eGFR's persistently <90 mL/min signify possible Chronic Kidney Disease.    Anion gap 13 5 - 15  CBC     Status: None   Collection Time: 09/15/14  1:15 PM  Result Value Ref Range   WBC 7.8 4.0 - 10.5 K/uL   RBC 4.73 4.22 - 5.81 MIL/uL   Hemoglobin 14.9 13.0 - 17.0 g/dL   HCT 44.2 39.0 - 52.0 %   MCV 93.4 78.0 - 100.0 fL   MCH 31.5 26.0 - 34.0 pg   MCHC 33.7 30.0 - 36.0 g/dL   RDW 12.5 11.5 - 15.5 %   Platelets 173 150 - 400 K/uL  Ethanol     Status: None   Collection Time: 09/15/14  1:15 PM  Result Value Ref Range   Alcohol, Ethyl (B) <11 0 - 11 mg/dL    Comment:        LOWEST DETECTABLE LIMIT FOR SERUM ALCOHOL IS 11 mg/dL  FOR MEDICAL PURPOSES ONLY   Protime-INR     Status: None   Collection Time: 09/15/14  1:15 PM  Result Value Ref Range   Prothrombin Time 12.7 11.6 - 15.2 seconds   INR 0.94 0.00 - 1.49  Sample to Blood Bank     Status: None   Collection Time: 09/15/14  1:18 PM  Result Value Ref Range   Blood Bank Specimen SAMPLE AVAILABLE FOR TESTING    Sample Expiration 09/16/2014   I-Stat Chem 8, ED     Status: Abnormal   Collection Time: 09/15/14  1:27 PM  Result Value Ref Range   Sodium 141 137 - 147 mEq/L   Potassium 4.1 3.7 - 5.3 mEq/L   Chloride 104 96 - 112 mEq/L   BUN 20 6 - 23 mg/dL   Creatinine, Ser 1.00 0.50 - 1.35 mg/dL   Glucose, Bld 103 (H) 70 - 99 mg/dL   Calcium, Ion 1.11 (L) 1.12 - 1.23 mmol/L   TCO2 20 0 - 100 mmol/L   Hemoglobin 15.6 13.0 - 17.0 g/dL   HCT 46.0 39.0 - 52.0 %  CBC     Status: Abnormal   Collection Time: 09/16/14  4:22 AM  Result Value Ref Range   WBC 7.7 4.0 - 10.5 K/uL   RBC 3.99 (L) 4.22 - 5.81 MIL/uL   Hemoglobin 12.6 (L) 13.0 - 17.0 g/dL    Comment: REPEATED TO VERIFY   HCT 37.2 (L) 39.0 - 52.0 %   MCV 93.2 78.0 - 100.0 fL   MCH 31.6 26.0 -  34.0 pg   MCHC 33.9 30.0 - 36.0 g/dL   RDW 12.4 11.5 - 15.5 %   Platelets 144 (L) 150 - 400 K/uL  Basic metabolic panel     Status: Abnormal   Collection Time: 09/16/14  4:22 AM  Result Value Ref Range   Sodium 135 (L) 137 - 147 mEq/L   Potassium 4.1 3.7 - 5.3 mEq/L   Chloride 99 96 - 112 mEq/L   CO2 22 19 - 32 mEq/L   Glucose, Bld 124 (H) 70 - 99 mg/dL   BUN 13 6 - 23 mg/dL   Creatinine, Ser 0.72 0.50 - 1.35 mg/dL   Calcium 8.6 8.4 - 10.5 mg/dL   GFR calc non Af Amer >90 >90 mL/min   GFR calc Af Amer >90 >90 mL/min    Comment: (NOTE) The eGFR has been calculated using the CKD EPI equation. This calculation has not been validated in all clinical situations. eGFR's persistently <90 mL/min signify possible Chronic Kidney Disease.    Anion gap 14 5 - 15    Dg Lumbar Spine Complete  09/15/2014   CLINICAL DATA:  Fall 40 feet from tree. Central lower back pain. Prior lumbar surgeries.  EXAM: LUMBAR SPINE - COMPLETE 4+ VIEW  COMPARISON:  Plain films 11/30/2013 and 11/16/2013  FINDINGS: There is a moderate compression in the L2 vertebral body. This is new since prior study. No malalignment. No additional fracture. Postoperative changes noted from posterior fusion L4-S1.  IMPRESSION: Moderate compression fracture at L2.   Electronically Signed   By: Rolm Baptise M.D.   On: 09/15/2014 16:45   Dg Tibia/fibula Right  09/15/2014   CLINICAL DATA:  Right lower leg pain swelling after falling 40 feet.  EXAM: RIGHT TIBIA AND FIBULA - 2 VIEW  COMPARISON:  None.  FINDINGS: No significant abnormalities of the tibia or fibula. There is a severely comminuted fracture of the calcaneus. There is chondromalacia  of the patella but there is no knee effusion.  IMPRESSION: Normal tibia and fibula. Severely comminuted fracture of the right calcaneus.   Electronically Signed   By: Rozetta Nunnery M.D.   On: 09/15/2014 14:00   Ct Head Wo Contrast  09/15/2014   CLINICAL DATA:  58 year old male fell more than 40 feet  from tree. Initial encounter. Open right calcaneus fracture. Severe spine pain.  EXAM: CT HEAD WITHOUT CONTRAST  CT CERVICAL SPINE WITHOUT CONTRAST  TECHNIQUE: Multidetector CT imaging of the head and cervical spine was performed following the standard protocol without intravenous contrast. Multiplanar CT image reconstructions of the cervical spine were also generated.  COMPARISON:  Cervical CT myelogram 06/16/2014. Head CT without contrast 09/15/2004.  FINDINGS: CT HEAD FINDINGS  Mild paranasal sinus mucosal thickening. Mastoids and tympanic cavities are clear. Visualized orbits are within normal limits. Calvarium intact. Mild left vertex scalp soft tissue scarring.  Calcified atherosclerosis at the skull base. Cerebral volume is normal. No midline shift, ventriculomegaly, mass effect, evidence of mass lesion, intracranial hemorrhage or evidence of cortically based acute infarction. Gray-white matter differentiation is within normal limits throughout the brain. No suspicious intracranial vascular hyperdensity.  CT CERVICAL SPINE FINDINGS  Chronic C5-C6 ACDF. Solid interbody arthrodesis. Hardware appears intact without loosening.  Stable cervical vertebral height and alignment. Visualized skull base is intact. No atlanto-occipital dissociation. Bilateral posterior element alignment is within normal limits. Cervicothoracic junction alignment is within normal limits.  Moderate to severe disc space loss and endplate spurring at H8-E9. Moderate to severe facet hypertrophy at C7-T1, worse on the left. Multiple subchondral cysts on that side and trace vacuum phenomena.  No acute cervical spine fracture identified. Visible upper thoracic levels appear intact.  Negative lung apices except for mild paraseptal emphysema. Negative non contrast paraspinal soft tissues. Small volume retained secretions in the pharynx.  IMPRESSION: 1. Stable and normal noncontrast CT appearance of the brain. 2. No acute fracture or listhesis  identified in the cervical spine. Ligamentous injury is not excluded. 3. Stable postoperative appearance of the cervical spine. Adjacent segment disease at C6-C7 and up to severe facet arthropathy at C7-T1.   Electronically Signed   By: Lars Pinks M.D.   On: 09/15/2014 16:31   Ct Chest W Contrast  09/15/2014   CLINICAL DATA:  58 year old male fell more than 40 feet from tree. Initial encounter. Open right calcaneus fracture. Severe spine pain.  EXAM: CT CHEST, ABDOMEN, AND PELVIS WITH CONTRAST  TECHNIQUE: Multidetector CT imaging of the chest, abdomen and pelvis was performed following the standard protocol during bolus administration of intravenous contrast.  CONTRAST:  100 mL Omnipaque 300.  COMPARISON:  Trauma series chest and pelvis radiographs from the same day reported separately. CT cervical spine from the same day reported separately. Lumbar spine CT myelogram 06/16/2014.  FINDINGS: CT CHEST FINDINGS  Major airways are patent. Mild dependent atelectasis. No pneumothorax, pulmonary contusion, or pleural effusion. Trace paraseptal emphysema.  No pericardial effusion. No mediastinal hematoma. Negative soft tissues at the thoracic inlet. Major mediastinal vascular structures appear intact. Minor calcified coronary artery plaque. No mediastinal, hilar, or axillary lymphadenopathy.  Sternum intact. Visualized shoulder osseous structures in intact. Chronic right tenth and eleventh rib fractures. No acute rib fracture. No thoracic spine fracture identified.  CT ABDOMEN AND PELVIS FINDINGS  Comminuted burst fracture of L2 with loss of height up to 50% compared to the September exam. Retropulsion of the posterior inferior endplate of about 6 mm. Subsequent lower L2 level spinal  canal narrowing to 7 mm (versus 15 mm at the same level in September). Small volume paraspinal hematoma. L2 pedicles and posterior elements intact.  Remaining lumbar levels are intact. Preexisting L4-L5 and L5-S1 posterior and interbody  fusion. Fusion hardware appears stable and intact sacrum and SI joints intact. Pelvis intact. Proximal femurs intact.  No pelvic free fluid. Unremarkable bladder. Negative distal colon except for redundancy and occasional diverticula. Redundant left colon. Negative transverse colon. Redundant right colon. Normal appendix. No dilated or inflamed small bowel. Decompressed stomach and duodenum.  Liver, gallbladder, spleen, pancreas, and adrenal glands appear intact. No abdominal free fluid. Portal venous system within normal limits. Major arterial structures in the abdomen and pelvis are patent. Aortoiliac calcified atherosclerosis noted. Kidneys intact with normal contrast excretion. Small renal cysts. No lymphadenopathy identified.  IMPRESSION: 1. Acute L2 burst fracture with retropulsion of bone narrowing the spinal canal by 50%. L2 loss of height up to 50%. 2. No other acute traumatic injury identified in the chest, abdomen, or pelvis. 3. Preexisting L4 through S1 posterior and interbody fusion. Preliminary report of this study without discrepancy to the above discussed by telephone with Dr. Shirlyn Goltz at 1541 hr on 09/15/2014.   Electronically Signed   By: Lars Pinks M.D.   On: 09/15/2014 16:24   Ct Cervical Spine Wo Contrast  09/15/2014   CLINICAL DATA:  58 year old male fell more than 40 feet from tree. Initial encounter. Open right calcaneus fracture. Severe spine pain.  EXAM: CT HEAD WITHOUT CONTRAST  CT CERVICAL SPINE WITHOUT CONTRAST  TECHNIQUE: Multidetector CT imaging of the head and cervical spine was performed following the standard protocol without intravenous contrast. Multiplanar CT image reconstructions of the cervical spine were also generated.  COMPARISON:  Cervical CT myelogram 06/16/2014. Head CT without contrast 09/15/2004.  FINDINGS: CT HEAD FINDINGS  Mild paranasal sinus mucosal thickening. Mastoids and tympanic cavities are clear. Visualized orbits are within normal limits. Calvarium intact.  Mild left vertex scalp soft tissue scarring.  Calcified atherosclerosis at the skull base. Cerebral volume is normal. No midline shift, ventriculomegaly, mass effect, evidence of mass lesion, intracranial hemorrhage or evidence of cortically based acute infarction. Gray-white matter differentiation is within normal limits throughout the brain. No suspicious intracranial vascular hyperdensity.  CT CERVICAL SPINE FINDINGS  Chronic C5-C6 ACDF. Solid interbody arthrodesis. Hardware appears intact without loosening.  Stable cervical vertebral height and alignment. Visualized skull base is intact. No atlanto-occipital dissociation. Bilateral posterior element alignment is within normal limits. Cervicothoracic junction alignment is within normal limits.  Moderate to severe disc space loss and endplate spurring at L2-G4. Moderate to severe facet hypertrophy at C7-T1, worse on the left. Multiple subchondral cysts on that side and trace vacuum phenomena.  No acute cervical spine fracture identified. Visible upper thoracic levels appear intact.  Negative lung apices except for mild paraseptal emphysema. Negative non contrast paraspinal soft tissues. Small volume retained secretions in the pharynx.  IMPRESSION: 1. Stable and normal noncontrast CT appearance of the brain. 2. No acute fracture or listhesis identified in the cervical spine. Ligamentous injury is not excluded. 3. Stable postoperative appearance of the cervical spine. Adjacent segment disease at C6-C7 and up to severe facet arthropathy at C7-T1.   Electronically Signed   By: Lars Pinks M.D.   On: 09/15/2014 16:31   Ct Abdomen Pelvis W Contrast  09/15/2014   CLINICAL DATA:  58 year old male fell more than 40 feet from tree. Initial encounter. Open right calcaneus fracture. Severe spine pain.  EXAM: CT CHEST, ABDOMEN, AND PELVIS WITH CONTRAST  TECHNIQUE: Multidetector CT imaging of the chest, abdomen and pelvis was performed following the standard protocol during  bolus administration of intravenous contrast.  CONTRAST:  100 mL Omnipaque 300.  COMPARISON:  Trauma series chest and pelvis radiographs from the same day reported separately. CT cervical spine from the same day reported separately. Lumbar spine CT myelogram 06/16/2014.  FINDINGS: CT CHEST FINDINGS  Major airways are patent. Mild dependent atelectasis. No pneumothorax, pulmonary contusion, or pleural effusion. Trace paraseptal emphysema.  No pericardial effusion. No mediastinal hematoma. Negative soft tissues at the thoracic inlet. Major mediastinal vascular structures appear intact. Minor calcified coronary artery plaque. No mediastinal, hilar, or axillary lymphadenopathy.  Sternum intact. Visualized shoulder osseous structures in intact. Chronic right tenth and eleventh rib fractures. No acute rib fracture. No thoracic spine fracture identified.  CT ABDOMEN AND PELVIS FINDINGS  Comminuted burst fracture of L2 with loss of height up to 50% compared to the September exam. Retropulsion of the posterior inferior endplate of about 6 mm. Subsequent lower L2 level spinal canal narrowing to 7 mm (versus 15 mm at the same level in September). Small volume paraspinal hematoma. L2 pedicles and posterior elements intact.  Remaining lumbar levels are intact. Preexisting L4-L5 and L5-S1 posterior and interbody fusion. Fusion hardware appears stable and intact sacrum and SI joints intact. Pelvis intact. Proximal femurs intact.  No pelvic free fluid. Unremarkable bladder. Negative distal colon except for redundancy and occasional diverticula. Redundant left colon. Negative transverse colon. Redundant right colon. Normal appendix. No dilated or inflamed small bowel. Decompressed stomach and duodenum.  Liver, gallbladder, spleen, pancreas, and adrenal glands appear intact. No abdominal free fluid. Portal venous system within normal limits. Major arterial structures in the abdomen and pelvis are patent. Aortoiliac calcified  atherosclerosis noted. Kidneys intact with normal contrast excretion. Small renal cysts. No lymphadenopathy identified.  IMPRESSION: 1. Acute L2 burst fracture with retropulsion of bone narrowing the spinal canal by 50%. L2 loss of height up to 50%. 2. No other acute traumatic injury identified in the chest, abdomen, or pelvis. 3. Preexisting L4 through S1 posterior and interbody fusion. Preliminary report of this study without discrepancy to the above discussed by telephone with Dr. Shirlyn Goltz at 1541 hr on 09/15/2014.   Electronically Signed   By: Lars Pinks M.D.   On: 09/15/2014 16:24   Ct Ankle Right Wo Contrast  09/15/2014   CLINICAL DATA:  EVALUATE BILATERAL CALCANEUS FRACTURES.  EXAM: CT OF THE RIGHT/ANKLE FOOT WITHOUT CONTRAST. CT OF THE LEFT FOOT/ANKLE WITHOUT CONTRAST.  TECHNIQUE: Multidetector CT imaging of the right foot was performed according to the standard protocol. Multiplanar CT image reconstructions were also generated.  COMPARISON:  Radiographs, same date.  FINDINGS: Left ankle/foot:  Comminuted and displaced calcaneal fractures are demonstrated. This is largely a central depression type fracture. The medial aspects of the posterior and middle facets are intact and the joint spaces are maintained. Laterally the posterior facet is fractured and depressed. There are also non articular fractures involving the posterior aspect of the calcaneus with a posterior V shaped vertical split extending out through both the medial and lateral cortices.  There is a small fracture involving the anterior process of the calcaneus.  The tibiotalar joint is maintained. No talar fracture. The cuboid is intact.  Right ankle/foot:  Severely comminuted and depressed central depression type calcaneus fracture. This is much more comminuted and displaced when compared to left-sided fracture. There are numerous small  bone fragments. The medial aspect of the MGL facet is intact. The entire posterior facet is depressed,  rotated and comminuted.  There is air noted in the soft tissues suggesting an open injury. The talus is intact. The tibiotalar joint is maintained. The cuboid is intact.  IMPRESSION: Left ankle/foot: Complex comminuted and displaced calcaneal fractures as described above.  Right ankle/foot: Complex severely comminuted and displaced calcaneal fractures as described above.   Electronically Signed   By: Kalman Jewels M.D.   On: 09/15/2014 16:38   Dg Pelvis Portable  09/15/2014   CLINICAL DATA:  58 year old male with history of trauma after falling 40 feet. Chest pain.  EXAM: PORTABLE PELVIS 1-2 VIEWS  COMPARISON:  No priors.  FINDINGS: AP view of the pelvis demonstrates no definite acute displaced fractures of the bony pelvic ring. Bilateral proximal femurs as visualized appear intact, the femoral heads appear located on this single view examination. Degenerative changes of osteoarthritis are noted in the hip joints bilaterally. Orthopedic fixation hardware in the lower lumbar spine is incompletely visualized.  IMPRESSION: 1. No evidence of significant acute traumatic injury to the bony pelvis.   Electronically Signed   By: Vinnie Langton M.D.   On: 09/15/2014 13:57   Ct Foot Left Wo Contrast  09/15/2014   CLINICAL DATA:  EVALUATE BILATERAL CALCANEUS FRACTURES.  EXAM: CT OF THE RIGHT/ANKLE FOOT WITHOUT CONTRAST. CT OF THE LEFT FOOT/ANKLE WITHOUT CONTRAST.  TECHNIQUE: Multidetector CT imaging of the right foot was performed according to the standard protocol. Multiplanar CT image reconstructions were also generated.  COMPARISON:  Radiographs, same date.  FINDINGS: Left ankle/foot:  Comminuted and displaced calcaneal fractures are demonstrated. This is largely a central depression type fracture. The medial aspects of the posterior and middle facets are intact and the joint spaces are maintained. Laterally the posterior facet is fractured and depressed. There are also non articular fractures involving the  posterior aspect of the calcaneus with a posterior V shaped vertical split extending out through both the medial and lateral cortices.  There is a small fracture involving the anterior process of the calcaneus.  The tibiotalar joint is maintained. No talar fracture. The cuboid is intact.  Right ankle/foot:  Severely comminuted and depressed central depression type calcaneus fracture. This is much more comminuted and displaced when compared to left-sided fracture. There are numerous small bone fragments. The medial aspect of the MGL facet is intact. The entire posterior facet is depressed, rotated and comminuted.  There is air noted in the soft tissues suggesting an open injury. The talus is intact. The tibiotalar joint is maintained. The cuboid is intact.  IMPRESSION: Left ankle/foot: Complex comminuted and displaced calcaneal fractures as described above.  Right ankle/foot: Complex severely comminuted and displaced calcaneal fractures as described above.   Electronically Signed   By: Kalman Jewels M.D.   On: 09/15/2014 16:38   Ct Foot Right Wo Contrast  09/15/2014   CLINICAL DATA:  EVALUATE BILATERAL CALCANEUS FRACTURES.  EXAM: CT OF THE RIGHT/ANKLE FOOT WITHOUT CONTRAST. CT OF THE LEFT FOOT/ANKLE WITHOUT CONTRAST.  TECHNIQUE: Multidetector CT imaging of the right foot was performed according to the standard protocol. Multiplanar CT image reconstructions were also generated.  COMPARISON:  Radiographs, same date.  FINDINGS: Left ankle/foot:  Comminuted and displaced calcaneal fractures are demonstrated. This is largely a central depression type fracture. The medial aspects of the posterior and middle facets are intact and the joint spaces are maintained. Laterally the posterior facet is fractured and depressed.  There are also non articular fractures involving the posterior aspect of the calcaneus with a posterior V shaped vertical split extending out through both the medial and lateral cortices.  There is a  small fracture involving the anterior process of the calcaneus.  The tibiotalar joint is maintained. No talar fracture. The cuboid is intact.  Right ankle/foot:  Severely comminuted and depressed central depression type calcaneus fracture. This is much more comminuted and displaced when compared to left-sided fracture. There are numerous small bone fragments. The medial aspect of the MGL facet is intact. The entire posterior facet is depressed, rotated and comminuted.  There is air noted in the soft tissues suggesting an open injury. The talus is intact. The tibiotalar joint is maintained. The cuboid is intact.  IMPRESSION: Left ankle/foot: Complex comminuted and displaced calcaneal fractures as described above.  Right ankle/foot: Complex severely comminuted and displaced calcaneal fractures as described above.   Electronically Signed   By: Kalman Jewels M.D.   On: 09/15/2014 16:38   Ct Ankle Left Wo Contrast  09/15/2014   CLINICAL DATA:  EVALUATE BILATERAL CALCANEUS FRACTURES.  EXAM: CT OF THE RIGHT/ANKLE FOOT WITHOUT CONTRAST. CT OF THE LEFT FOOT/ANKLE WITHOUT CONTRAST.  TECHNIQUE: Multidetector CT imaging of the right foot was performed according to the standard protocol. Multiplanar CT image reconstructions were also generated.  COMPARISON:  Radiographs, same date.  FINDINGS: Left ankle/foot:  Comminuted and displaced calcaneal fractures are demonstrated. This is largely a central depression type fracture. The medial aspects of the posterior and middle facets are intact and the joint spaces are maintained. Laterally the posterior facet is fractured and depressed. There are also non articular fractures involving the posterior aspect of the calcaneus with a posterior V shaped vertical split extending out through both the medial and lateral cortices.  There is a small fracture involving the anterior process of the calcaneus.  The tibiotalar joint is maintained. No talar fracture. The cuboid is intact.  Right  ankle/foot:  Severely comminuted and depressed central depression type calcaneus fracture. This is much more comminuted and displaced when compared to left-sided fracture. There are numerous small bone fragments. The medial aspect of the MGL facet is intact. The entire posterior facet is depressed, rotated and comminuted.  There is air noted in the soft tissues suggesting an open injury. The talus is intact. The tibiotalar joint is maintained. The cuboid is intact.  IMPRESSION: Left ankle/foot: Complex comminuted and displaced calcaneal fractures as described above.  Right ankle/foot: Complex severely comminuted and displaced calcaneal fractures as described above.   Electronically Signed   By: Kalman Jewels M.D.   On: 09/15/2014 16:38   Dg Chest Port 1 View  09/15/2014   CLINICAL DATA:  Fall 40 feet.  Left chest pain.  EXAM: PORTABLE CHEST - 1 VIEW  COMPARISON:  05/28/2007  FINDINGS: No pneumothorax. Heart and mediastinal contours are within normal limits. No focal opacities or effusions. No acute bony abnormality.  IMPRESSION: No active disease.   Electronically Signed   By: Rolm Baptise M.D.   On: 09/15/2014 13:58   Dg Foot 2 Views Left  09/15/2014   CLINICAL DATA:  58 year old male who fell 40-50 feet while cutting tree limbs. Initial encounter.  EXAM: LEFT FOOT - 2 VIEW  COMPARISON:  Bilateral foot and ankle CTs 1508 hr today.  FINDINGS: Comminuted calcaneus fracture re - identified with partial collapse of the calcaneus.  Other tarsal bones appear intact. Metatarsals and phalanges appear intact. Distal tibia and  fibula appear intact.  IMPRESSION: Highly comminuted fracture of the left calcaneus. No other left foot fracture identified. See also Left Foot/ankle CT from today reported separately.   Electronically Signed   By: Lars Pinks M.D.   On: 09/15/2014 16:46   Dg Foot 2 Views Right  09/15/2014   CLINICAL DATA:  Right heel pain in laceration after fall of 40 feet.  EXAM: RIGHT FOOT - 2 VIEW   COMPARISON:  None.  FINDINGS: Severely displaced and comminuted fracture is seen involving the anterior and middle portions of the calcaneus. No soft tissue abnormality is noted. No radiopaque foreign body is noted.  IMPRESSION: Severely displaced and comminuted fracture is seen involving the anterior and middle portions of the calcaneus.   Electronically Signed   By: Sabino Dick M.D.   On: 09/15/2014 14:20    Review of Systems  All other systems reviewed and are negative.  Blood pressure 126/82, pulse 79, temperature 97.6 F (36.4 C), temperature source Oral, resp. rate 16, height '6\' 1"'  (1.854 m), weight 81.647 kg (180 lb), SpO2 97 %. Physical Exam On examination patient's both lower extremities are wrapped and elevated to minimize swelling and to minimize risk of fracture blisters. Patient is status post irrigation debridement for open calcaneal fracture on the right. CT scan was reviewed which shows a comminuted posterior facet fractures bilaterally.  Assessment/Plan:  Assessment: Bilateral calcaneal fractures right open.  Plan: We'll have the patient keep his feet elevated and plan for open reduction internal fixation bilateral calcaneal fractures on Sunday. Discussed with the patient that he has to 100% stop smoking. Discussed with smoking he is a risk of necrosis of the skin flap and possible below the knee amputation. Patient states he understands and wishes to proceed with surgery. Discussed the risk of arthritis wound healing and that patient most likely will require short-term rehabilitation stay. Patient will need to be essentially nonweightbearing for both feet for 1 month.   DUDA,MARCUS V 09/16/2014, 7:44 AM

## 2014-09-19 NOTE — Transfer of Care (Signed)
Immediate Anesthesia Transfer of Care Note  Patient: Bradley Espinoza  Procedure(s) Performed: Procedure(s): OPEN REDUCTION INTERNAL FIXATION (ORIF) CALCANEOUS FRACTURE (Right) OPEN REDUCTION INTERNAL FIXATION (ORIF) CALCANEOUS FRACTURE (Left)  Patient Location: PACU  Anesthesia Type:General  Level of Consciousness: awake, alert , oriented and patient cooperative  Airway & Oxygen Therapy: Patient Spontanous Breathing and Patient connected to nasal cannula oxygen  Post-op Assessment: Report given to PACU RN, Post -op Vital signs reviewed and stable and Patient moving all extremities  Post vital signs: Reviewed and stable  Complications: No apparent anesthesia complications

## 2014-09-19 NOTE — Op Note (Signed)
09/15/2014 - 09/19/2014  10:22 AM  PATIENT:  Bradley Espinoza    PRE-OPERATIVE DIAGNOSIS:  bilateral calcaneal fractures  POST-OPERATIVE DIAGNOSIS:  Same  PROCEDURE:  OPEN REDUCTION INTERNAL FIXATION (ORIF) CALCANEOUS FRACTURE bilateral  SURGEON:  Newt Minion, MD  PHYSICIAN ASSISTANT:None ANESTHESIA:   General  PREOPERATIVE INDICATIONS:  [name] is a  58 y.o. male with a diagnosis of bilateral calcaneal fractures who failed conservative measures and elected for surgical management.    The risks benefits and alternatives were discussed with the patient preoperatively including but not limited to the risks of infection, bleeding, nerve injury, cardiopulmonary complications, the need for revision surgery, among others, and the patient was willing to proceed.  OPERATIVE IMPLANTS: Synthes locking calcaneal plate large left and right  Demineralized bone matrix and cancellus chips 5 mL each calcaneus  OPERATIVE FINDINGS: Severely comminuted calcaneal fracture with bone loss  OPERATIVE PROCEDURE: Patient is a 58 year old gentleman who fell approximately 30 feet from a ladder open calcaneus fracture on the right closed on the left and presents at this time after stabilization for open reduction internal fixation bilateral calcaneal fractures. Risks and benefits were discussed patient states he understands and wishes to proceed at this time.  Patient was brought to the operating room and underwent a general anesthetic. After adequate levels of anesthesia were obtained patient was placed in the right lateral decubitus position with the left side up and the left lower extremity was prepped using DuraPrep draped into a sterile field. Charlie Pitter was used to cover all exposed skin. A timeout was called. An extensile incision was made laterally the flap was elevated and K wires were inserted to hold the flap elevated so there was no tension on the skin. Patient had a completely blown out  lateral wall this was removed. A Steinmann pin was placed in the calcaneus to provide traction. Patient underwent reduction after cleansing of the posterior facet and the posterior facet was reduced and aligned stabilized with a K wire. A plate was then placed laterally and secured with compression screws to stabilize the fracture. Bone graft was used to fill the void. C-arm fluoroscopy verified alignment in both axial and lateral views. The posterior facet was congruent. The wound was irrigated with normal saline throughout the case. Subcutaneous is closed using 2-0 Vicryl the skin was closed using an Algower Donati suture technique. A sterile compressive dressing was applied. The drapes were then removed and patient was flipped to the left lateral decubitus position with the right side up.  An extensile incision was made laterally after a timeout was called Charlie Pitter was used to cover all exposed skin after sterile prepping and draping. Flap was elevated patient also had a completely blown out lateral wall. The posterior facet fragment was completely devoid of soft tissue and was a loose piece of bone. This was cleansed the posterior facet was reduced and stabilized with a K wire. This was then stabilized and aligned with a large Synthes plate and compression screws were placed to provide alignment. C-arm fluoroscopy verified alignment. The bone void was filled with 5 mL of demineralized bone graft and bone chips. C-arm fluoroscopy verified alignment. The subcutaneous is closed using 2-0 Vicryl. Skin was closed using an Cale suture technique. Sterile dressing was applied. Patient was extubated taken to the PACU in stable condition.

## 2014-09-19 NOTE — Interval H&P Note (Signed)
History and Physical Interval Note:  09/19/2014 10:21 AM  Bradley Espinoza  has presented today for surgery, with the diagnosis of bilateral calcaneal fractures  The various methods of treatment have been discussed with the patient and family. After consideration of risks, benefits and other options for treatment, the patient has consented to  Procedure(s): OPEN REDUCTION INTERNAL FIXATION (ORIF) CALCANEOUS FRACTURE (Right) OPEN REDUCTION INTERNAL FIXATION (ORIF) CALCANEOUS FRACTURE (Left) as a surgical intervention .  The patient's history has been reviewed, patient examined, no change in status, stable for surgery.  I have reviewed the patient's chart and labs.  Questions were answered to the patient's satisfaction.     Severa Jeremiah V

## 2014-09-19 NOTE — Anesthesia Procedure Notes (Signed)
Procedure Name: Intubation Date/Time: 09/19/2014 7:35 AM Performed by: Julian Reil Pre-anesthesia Checklist: Emergency Drugs available, Suction available, Patient being monitored and Patient identified Patient Re-evaluated:Patient Re-evaluated prior to inductionOxygen Delivery Method: Circle system utilized Preoxygenation: Pre-oxygenation with 100% oxygen Intubation Type: IV induction Ventilation: Mask ventilation without difficulty Laryngoscope Size: 4 and Mac Grade View: Grade II Tube type: Oral Tube size: 7.5 mm Number of attempts: 1 Airway Equipment and Method: Stylet Placement Confirmation: ETT inserted through vocal cords under direct vision,  positive ETCO2 and breath sounds checked- equal and bilateral Secured at: 22 cm Tube secured with: Tape Dental Injury: Teeth and Oropharynx as per pre-operative assessment

## 2014-09-19 NOTE — Progress Notes (Signed)
Orthopedic Tech Progress Note Patient Details:  Bradley Espinoza October 11, 1955 [id number]  Ortho Devices Type of Ortho Device: Postop shoe/boot Ortho Device/Splint Location: bilateral Ortho Device/Splint Interventions: Application   Agustina Witzke 09/19/2014, 1:08 PM

## 2014-09-20 DIAGNOSIS — W19XXXS Unspecified fall, sequela: Secondary | ICD-10-CM

## 2014-09-20 DIAGNOSIS — S92001S Unspecified fracture of right calcaneus, sequela: Secondary | ICD-10-CM

## 2014-09-20 DIAGNOSIS — S32029S Unspecified fracture of second lumbar vertebra, sequela: Secondary | ICD-10-CM

## 2014-09-20 MED ORDER — METHOCARBAMOL 500 MG PO TABS: 1000.0000 mg | ORAL_TABLET | Freq: Three times a day (TID) | ORAL | Status: DC

## 2014-09-20 MED ORDER — ACETAMINOPHEN 325 MG PO TABS
650.0000 mg | ORAL_TABLET | Freq: Four times a day (QID) | ORAL | Status: DC
  Administered 2014-09-20 – 2014-09-22 (×10): 650 mg via ORAL
  Filled 2014-09-20 (×10): qty 2

## 2014-09-20 MED ORDER — METHOCARBAMOL 500 MG PO TABS
1000.0000 mg | ORAL_TABLET | Freq: Three times a day (TID) | ORAL | Status: DC | PRN
  Administered 2014-09-20 – 2014-09-22 (×5): 1000 mg via ORAL
  Filled 2014-09-20 (×6): qty 2

## 2014-09-20 MED ORDER — OXYCODONE HCL 5 MG PO TABS
5.0000 mg | ORAL_TABLET | ORAL | Status: DC | PRN
  Administered 2014-09-20 – 2014-09-22 (×13): 10 mg via ORAL
  Filled 2014-09-20 (×14): qty 2

## 2014-09-20 MED ORDER — METHOCARBAMOL 500 MG PO TABS
500.0000 mg | ORAL_TABLET | Freq: Three times a day (TID) | ORAL | Status: DC
  Administered 2014-09-20: 500 mg via ORAL
  Filled 2014-09-20: qty 1

## 2014-09-20 MED ORDER — HYDROMORPHONE HCL 1 MG/ML IJ SOLN
1.0000 mg | INTRAMUSCULAR | Status: DC | PRN
  Administered 2014-09-20 – 2014-09-21 (×2): 1 mg via INTRAVENOUS
  Filled 2014-09-20 (×3): qty 1

## 2014-09-20 NOTE — Evaluation (Signed)
Physical Therapy Evaluation Patient Details Name: Bradley Espinoza MRN: [id number] DOB: 1956/08/07 Today's Date: 09/20/2014   History of Present Illness  Patient is a 58 year old gentleman who states that he was up on a ladder approximately 30 feet when he was trimming a tree branch with a chain saw when the branch snapped he states he was thrown 10 feet in the air and landed on both feet. Patient states he had immediate onset of lower back pain. Patient is status post lumbar spine fusion.  Pt suffered B calcaneal fractures and L2 fracture.  Clinical Impression  Pt admitted with above diagnosis. Pt currently with functional limitations due to the deficits listed below (see PT Problem List).  Pt will benefit from skilled PT to increase their independence and safety with mobility to allow discharge to the venue listed below.  Pt demonstrates good upper body strength to A with transfers.  He also demonstrated good recall of education provided by brace company.     Follow Up Recommendations CIR    Equipment Recommendations  None recommended by PT    Recommendations for Other Services       Precautions / Restrictions Precautions Precautions: Back Precaution Comments: Educated on back precautions and use of brace. Required Braces or Orthoses: Spinal Brace Spinal Brace: Thoracolumbosacral orthotic;Applied in supine position Restrictions Weight Bearing Restrictions: Yes RLE Weight Bearing: Non weight bearing LLE Weight Bearing: Non weight bearing      Mobility  Bed Mobility Overal bed mobility: Needs Assistance Bed Mobility: Rolling;Sidelying to Sit Rolling: Min assist Sidelying to sit: Mod assist       General bed mobility comments: A primarily given for LE.  Was able to get trunk upright with decreased speed with [place] of rail.  Transfers Overall transfer level: Needs assistance   Transfers: Comptroller transfers: +2 physical  assistance;Mod assist   General transfer comment: A provided to lift up B LE for transfer with A given at upper body by 2 CNAs.  Pt with good arm strength and able to provide A.  Ambulation/Gait                Stairs            Wheelchair Mobility    Modified Rankin (Stroke Patients Only)       Balance                                             Pertinent Vitals/Pain Pain Assessment: 0-10 Pain Score: 9  Pain Location: feet Pain Descriptors / Indicators: Burning Pain Intervention(s): Premedicated before session    Home Living Family/patient expects to be discharged to:: Inpatient rehab                      Prior Function Level of Independence: Independent         Comments: Pt reports he was on disability due to multiple back surgeries.     Hand Dominance   Dominant Hand: Right    Extremity/Trunk Assessment               Lower Extremity Assessment: LLE deficits/detail;RLE deficits/detail         Communication   Communication: No difficulties  Cognition Arousal/Alertness: Awake/alert Behavior During Therapy: WFL for tasks assessed/performed Overall Cognitive Status: Within Functional Limits for tasks assessed  General Comments      Exercises General Exercises - Lower Extremity Ankle Circles/Pumps: AROM;Both;5 reps Other Exercises Other Exercises: wiggling toes      Assessment/Plan    PT Assessment Patient needs continued PT services  PT Diagnosis Acute pain   PT Problem List Decreased mobility;Decreased knowledge of precautions;Decreased knowledge of use of DME  PT Treatment Interventions DME instruction;Functional mobility training;Therapeutic activities;Therapeutic exercise;Balance training;Wheelchair mobility training   PT Goals (Current goals can be found in the Care Plan section) Acute Rehab PT Goals Patient Stated Goal: I can't go home like this. PT Goal  Formulation: With patient Time For Goal Achievement: 10/04/14 Potential to Achieve Goals: Good    Frequency Min 4X/week   Barriers to discharge Other (comment) NWB and TLSO    Co-evaluation               End of Session Equipment Utilized During Treatment: Back brace Activity Tolerance: Patient tolerated treatment well;Patient limited by pain Patient left: in chair;with call bell/phone within reach Nurse Communication: Mobility status         Time: 1000-1041 PT Time Calculation (min) (ACUTE ONLY): 41 min   Charges:   PT Evaluation $Initial PT Evaluation Tier I: 1 Procedure PT Treatments $Therapeutic Activity: 23-37 mins   PT G Codes:          Talvin Christianson LUBECK 09/20/2014, 11:15 AM

## 2014-09-20 NOTE — Progress Notes (Signed)
Patient ID: Bradley Espinoza, male   DOB: 08/08/1956, 58 y.o.   MRN: [id number] C/o pain both feet. Less lumbar pain, neuro intact

## 2014-09-20 NOTE — Consult Note (Signed)
Physical Medicine and Rehabilitation Consult  Reason for Consult: Bilateral ankle fractures and L2 fracture. Referring Physician: Dr. Grandville Silos.    HPI: Bradley Espinoza is a 58 y.o. male history of multiple back surgeries and cervical radiculopathy who was trying to cut a limb in a tree today and fell off his ladder about 40 feet on 09/15/14.  He landed on his feet and instantly had horrible back and bilateral ankle pain.Work up revealed bilateral calcaneal fractures--right open as well as comminuted burst L2 fracture with 50% loss of height and some retropulsion. Conservative management with TLSO for support recommended by Dr. Joya Salm. He was taken to OR the same day for I and D right open ankle fracture and bilateral ankles splinted till edema improved. On 09/19/14, he underwent ORIF bilateral calcaneal fractures by Dr. Sharol Given. Post op to be NWB at least a month. PT evaluation done today and CIR recommended by MD and rehab team.    Review of Systems  HENT: Negative for hearing loss.   Eyes: Negative for blurred vision and double vision.  Respiratory: Negative for shortness of breath.   Cardiovascular: Negative for chest pain and palpitations.  Gastrointestinal: Positive for nausea. Negative for heartburn, vomiting and constipation.  Musculoskeletal: Positive for myalgias, back pain and joint pain.  Neurological: Negative for headaches.      Past Medical History  Diagnosis Date  . Anxiety   . Depression   . Headache(784.0)   . Arthritis     Past Surgical History  Procedure Laterality Date  . Back surgery  2004  . Cervical spine surgery  2008  . Inguinal hernia repair Bilateral   . I&d extremity Right 09/15/2014    Procedure: IRRIGATION AND DEBRIDEMENT Ankle;  Surgeon: Renette Butters, MD;  Location: [facility];  Service: Orthopedics;  Laterality: Right;    Family History  Problem Relation Age of Onset  . Diabetes Father   . Cancer Other     Social History:  Divorced  and lives alone. Used to work in [employer]. Disabled due to multiple back surgeries.  He reports that he has been smoking Cigarettes.  He started smoking about 37 years ago. He has a 10 pack-year smoking history. He has never used smokeless tobacco. He reports that he does not drink alcohol or use illicit drugs.    Allergies: No Active Allergies    Medications Prior to Admission  Medication Sig Dispense Refill  . aspirin-acetaminophen-caffeine (EXCEDRIN MIGRAINE) 250-250-65 MG per tablet Take 1 tablet by mouth daily as needed for headache.     . Aspirin-Salicylamide-Caffeine (BC HEADACHE POWDER PO) Take 1 packet by mouth daily as needed (Headache).     . diazepam (VALIUM) 5 MG tablet Take 5 mg by mouth every 6 (six) hours as needed. For muscle pain    . ibuprofen (ADVIL,MOTRIN) 200 MG tablet Take 200 mg by mouth daily as needed for headache.     . Menthol, Topical Analgesic, (BIOFREEZE) 4 % GEL Apply 1 application topically as needed (Pain).     . OxyCODONE HCl ER (OXYCONTIN) 30 MG T12A Take 30 mg by mouth every 12 (twelve) hours.    Marland Kitchen oxyCODONE-acetaminophen (PERCOCET/ROXICET) 5-325 MG per tablet Take 1 tablet by mouth every 4 (four) hours as needed for moderate pain or severe pain.    . ranitidine (ZANTAC) 150 MG tablet Take 150 mg by mouth 2 (two) times daily.    . famotidine (PEPCID) 20 MG tablet Take 1 tablet (20 mg  total) by mouth 2 (two) times daily. (Patient not taking: Reported on 09/15/2014) 15 tablet 0  . ibuprofen (ADVIL,MOTRIN) 600 MG tablet Take 1 tablet (600 mg total) by mouth every 6 (six) hours as needed. (Patient not taking: Reported on 09/16/2014) 15 tablet 0  . oxyCODONE (OXY IR/ROXICODONE) 5 MG immediate release tablet Take 5 mg by mouth every 4 (four) hours as needed.    . OxyCODONE (OXYCONTIN) 15 mg T12A 12 hr tablet Take 15 mg by mouth every 12 (twelve) hours.      Home: Home Living Family/patient expects to be discharged to:: Inpatient rehab Living Arrangements:  Alone  Functional History: Prior Function Level of Independence: Independent Comments: Pt reports he was on disability due to multiple back surgeries. Functional Status:  Mobility: Bed Mobility Overal bed mobility: Needs Assistance Bed Mobility: Rolling, Sidelying to Sit Rolling: Min assist Sidelying to sit: Mod assist General bed mobility comments: A primarily given for LE.  Was able to get trunk upright with decreased speed with [place] of rail. Transfers Overall transfer level: Needs assistance Transfers: Government social research officer transfers: +2 physical assistance, Mod assist General transfer comment: A provided to lift up B LE for transfer with A given at upper body by 2 CNAs.  Pt with good arm strength and able to provide A.      ADL:    Cognition: Cognition Overall Cognitive Status: Within Functional Limits for tasks assessed Orientation Level: Oriented to person Cognition Arousal/Alertness: Awake/alert Behavior During Therapy: WFL for tasks assessed/performed Overall Cognitive Status: Within Functional Limits for tasks assessed  Blood pressure 140/81, pulse 79, temperature 98.4 F (36.9 C), temperature source Oral, resp. rate 18, height 6\' 1"  (1.854 m), weight 81.647 kg (180 lb), SpO2 98 %. Physical Exam  Nursing note and vitals reviewed. Constitutional: He is oriented to person, place, and time. He appears well-developed and well-nourished.  HENT:  Head: Normocephalic and atraumatic.  Eyes: Conjunctivae are normal. Pupils are equal, round, and reactive to light.  Neck: Normal range of motion. Neck supple.  Cardiovascular: Normal rate and regular rhythm.   Respiratory: Effort normal and breath sounds normal. No respiratory distress. He has no wheezes.  GI: Soft. Bowel sounds are normal.  Musculoskeletal: He exhibits edema.  Bilateral ankles with compressive dressings.   Neurological: He is alert and oriented to person, place, and time.  Moves BUE  without difficulty. BLE limited by pain/spasms. No sensory deficits  Skin: Skin is warm and dry.  Psychiatric: He has a normal mood and affect. His behavior is normal. Judgment and thought content normal.    No results found for this or any previous visit (from the past 24 hour(s)). No results found.  Assessment/Plan: Diagnosis: bilateral calcaneal fx's and L2 compression fx after fall 1. Does the need for close, 24 hr/day medical supervision in concert with the patient's rehab needs make it unreasonable for this patient to be served in a less intensive setting? Yes 2. Co-Morbidities requiring supervision/potential complications: pain, wound care 3. Due to bladder management, bowel management, safety, skin/wound care, disease management, medication administration, pain management and patient education, does the patient require 24 hr/day rehab nursing? Yes 4. Does the patient require coordinated care of a physician, rehab nurse, PT (1-2 hrs/day, 5 days/week) and OT (1-2 hrs/day, 5 days/week) to address physical and functional deficits in the context of the above medical diagnosis(es)? Yes Addressing deficits in the following areas: balance, endurance, locomotion, strength, transferring, bowel/bladder control, bathing, dressing, feeding, grooming and toileting 5. Can  the patient actively participate in an intensive therapy program of at least 3 hrs of therapy per day at least 5 days per week? Yes 6. The potential for patient to make measurable gains while on inpatient rehab is excellent 7. Anticipated functional outcomes upon discharge from inpatient rehab are modified independent  with PT, modified independent with OT, n/a with SLP. 8. Estimated rehab length of stay to reach the above functional goals is: 7-10 days 9. Does the patient have adequate social supports and living environment to accommodate these discharge functional goals? Yes 10. Anticipated D/C setting: Home 11. Anticipated post D/C  treatments: HH therapy and Outpatient therapy 12. Overall Rehab/Functional Prognosis: excellent  RECOMMENDATIONS: This patient's condition is appropriate for continued rehabilitative care in the following setting: CIR Patient has agreed to participate in recommended program. Yes Note that insurance prior authorization may be required for reimbursement for recommended care.  Comment: w/c level goals. Rehab Admissions Coordinator to follow up.  Thanks,  Meredith Staggers, MD, Mellody Drown     09/20/2014

## 2014-09-20 NOTE — Progress Notes (Signed)
Rehab Admissions Coordinator Note:  Patient was screened by Retta Diones for appropriateness for an Inpatient Acute Rehab Consult.  At this time, we are recommending Inpatient Rehab consult.  Retta Diones 09/20/2014, 11:23 AM  I can be reached at [phone].

## 2014-09-20 NOTE — Progress Notes (Signed)
Patient ID: Bradley Espinoza, male   DOB: Dec 26, 1955, 58 y.o.   MRN: [id number] 1 Day Post-Op  Subjective: Back feeling better in the brace. Having MM spasms in B feet.  Objective: Vital signs in last 24 hours: Temp:  [98.1 F (36.7 C)-98.6 F (37 C)] 98.4 F (36.9 C) (12/14 1130) Pulse Rate:  [77-88] 79 (12/14 1130) Resp:  [11-18] 18 (12/14 1130) BP: (137-143)/(68-84) 140/81 mmHg (12/14 1130) SpO2:  [95 %-98 %] 98 % (12/14 1130) Last BM Date: 09/17/14  Intake/Output from previous day: 12/13 0701 - 12/14 0700 In: 2072.3 [P.O.:240; I.V.:1682.3; IV Piggyback:150] Out: 3900 [Urine:3700; Blood:200] Intake/Output this shift:    General appearance: alert and cooperative Resp: clear to auscultation bilaterally Cardio: regular rate and rhythm GI: soft, brace on Extremities: B feet ortho dressings, toes warm  Lab Results: CBC   Recent Labs  09/18/14 0444  WBC 7.3  HGB 11.9*  HCT 35.9*  PLT 145*   BMET No results for input(s): NA, K, CL, CO2, GLUCOSE, BUN, CREATININE, CALCIUM in the last 72 hours. PT/INR No results for input(s): LABPROT, INR in the last 72 hours. ABG No results for input(s): PHART, HCO3 in the last 72 hours.  Invalid input(s): PCO2, PO2  Studies/Results: No results found.  Anti-infectives: Anti-infectives    Start     Dose/Rate Route Frequency Ordered Stop   09/19/14 1200  ceFAZolin (ANCEF) IVPB 1 g/50 mL premix     1 g100 mL/hr over 30 Minutes Intravenous Every 6 hours 09/19/14 1157 09/20/14 0044   09/18/14 1017  ceFAZolin (ANCEF) IVPB 2 g/50 mL premix  Status:  Discontinued     2 g100 mL/hr over 30 Minutes Intravenous On call to O.R. 09/18/14 1017 09/19/14 1157   09/16/14 0200  ceFAZolin (ANCEF) IVPB 2 g/50 mL premix     2 g100 mL/hr over 30 Minutes Intravenous Every 6 hours 09/16/14 0024 09/18/14 2139   09/15/14 1345  ceFAZolin (ANCEF) IVPB 1 g/50 mL premix     1 g100 mL/hr over 30 Minutes Intravenous  Once 09/15/14 1340 09/15/14 1618       Assessment/Plan: Fall off ladder - trimming trees L2 fx -- mobilize in TLSO per Dr. Joya Salm Bilateral calcaneal fxs, right open s/p I&D, ex fix -- bilateral ORIF by Dr. Sharol Given; NWB BLE ABL anemia -- stabilizing and improving Chronic pain -- previous back surgery FEN -- increase PRN Robaxin, D/C PCA, Dilaudid for breakthrough, on scheduled Ultram and Oxycontin VTE -- SCD's, Lovenox  LOS: 5 days    Georganna Skeans, MD, MPH, FACS Trauma: [phone] General Surgery: [phone]  09/20/2014

## 2014-09-20 NOTE — Progress Notes (Signed)
Patient ID: Bradley Espinoza, male   DOB: 10-22-1955, 58 y.o.   MRN: [id number] Postoperative day 1 status post open reduction internal fixation bilateral calcaneal fractures. Patient had extreme amount comminution. Patient will need discharge to skilled nursing. Patient states that he does not like using the PCA. Will have him continue with the Percocet and Robaxin.

## 2014-09-21 ENCOUNTER — Encounter (HOSPITAL_COMMUNITY): Payer: Self-pay | Admitting: Orthopedic Surgery

## 2014-09-21 DIAGNOSIS — W11XXXA Fall on and from ladder, initial encounter: Secondary | ICD-10-CM

## 2014-09-21 DIAGNOSIS — S92001A Unspecified fracture of right calcaneus, initial encounter for closed fracture: Secondary | ICD-10-CM | POA: Diagnosis present

## 2014-09-21 DIAGNOSIS — S32029A Unspecified fracture of second lumbar vertebra, initial encounter for closed fracture: Secondary | ICD-10-CM | POA: Diagnosis present

## 2014-09-21 DIAGNOSIS — G8929 Other chronic pain: Secondary | ICD-10-CM | POA: Diagnosis present

## 2014-09-21 DIAGNOSIS — D62 Acute posthemorrhagic anemia: Secondary | ICD-10-CM | POA: Diagnosis present

## 2014-09-21 DIAGNOSIS — S92002A Unspecified fracture of left calcaneus, initial encounter for closed fracture: Secondary | ICD-10-CM

## 2014-09-21 MED ORDER — HYDROMORPHONE HCL 1 MG/ML IJ SOLN
1.0000 mg | INTRAMUSCULAR | Status: DC | PRN
  Administered 2014-09-21 – 2014-09-22 (×3): 1 mg via INTRAVENOUS
  Filled 2014-09-21 (×3): qty 1

## 2014-09-21 NOTE — Progress Notes (Signed)
Physical Therapy Treatment Patient Details Name: Bradley Espinoza MRN: [id number] DOB: Apr 23, 1956 Today's Date: 09/21/2014    History of Present Illness Patient is a 58 year old gentleman who states that he was up on a ladder approximately 30 feet when he was trimming a tree branch with a chain saw when the branch snapped he states he was thrown 10 feet in the air and landed on both feet. Patient states he had immediate onset of lower back pain. Patient is status post lumbar spine fusion.  Pt suffered B calcaneal fractures and L2 fracture.    PT Comments    Pt with improved ability to assist with transfer this date however con't to require 2 person assist due to back precautions and bilat LE NWB and 9/10 pain. Pt to con't to benefit from CIR upon d/c.  Follow Up Recommendations  CIR     Equipment Recommendations  None recommended by PT    Recommendations for Other Services       Precautions / Restrictions Precautions Precautions: Back Precaution Comments: Educated on back precautions and use of brace. Required Braces or Orthoses: Spinal Brace Spinal Brace: Thoracolumbosacral orthotic;Applied in supine position Restrictions Weight Bearing Restrictions: Yes RLE Weight Bearing: Non weight bearing LLE Weight Bearing: Non weight bearing    Mobility  Bed Mobility Overal bed mobility: Needs Assistance Bed Mobility: Rolling;Sidelying to Sit Rolling: Min assist Sidelying to sit: Mod assist;+2 for physical assistance       General bed mobility comments: 1 person to manage LEs due to increased pain, 2nd person to assist with trunk elevation  Transfers Overall transfer level: Needs assistance Equipment used:  (2 person assist) Transfers: Comptroller transfers: +2 physical assistance;Mod assist   General transfer comment: pt able to use UEs to lift, PT assisted in elevation to clear pilloqs with bed pad. Rehab tech assisted with LE  management  Ambulation/Gait                 Stairs            Wheelchair Mobility    Modified Rankin (Stroke Patients Only)       Balance Overall balance assessment: Needs assistance Sitting-balance support: Bilateral upper extremity supported Sitting balance-Leahy Scale: Poor Sitting balance - Comments: strong posterior lean due to TLSO                            Cognition Arousal/Alertness: Awake/alert Behavior During Therapy: WFL for tasks assessed/performed Overall Cognitive Status: Within Functional Limits for tasks assessed                      Exercises      General Comments        Pertinent Vitals/Pain Pain Assessment: 0-10 Pain Score: 9  Pain Location: Feet Pain Descriptors / Indicators: Burning;Constant Pain Intervention(s): Premedicated before session    Home Living Family/patient expects to be discharged to:: Inpatient rehab Living Arrangements: Alone                  Prior Function Level of Independence: Independent      Comments: Pt reports he was on disability due to multiple back surgeries.   PT Goals (current goals can now be found in the care plan section) Progress towards PT goals: Progressing toward goals    Frequency  Min 4X/week    PT Plan Current plan remains appropriate  Co-evaluation             End of Session Equipment Utilized During Treatment: Back brace Activity Tolerance: Patient limited by pain Patient left: in chair;with call bell/phone within reach (OT present)     Time: 5462-7035 PT Time Calculation (min) (ACUTE ONLY): 23 min  Charges:  $Therapeutic Activity: 23-37 mins                    G CodesKingsley Espinoza 09/21/2014, 9:48 AM   Kittie Plater, PT, DPT Pager #: [phone] Office #: [phone]

## 2014-09-21 NOTE — Progress Notes (Signed)
Patient ID: Bradley Espinoza, male   DOB: 08/11/1956, 58 y.o.   MRN: [id number] Neuro unchanged. Less lumbar pain

## 2014-09-21 NOTE — Discharge Summary (Signed)
[facility] Trauma Service Discharge Summary   Patient ID: PRESLEY SUMMERLIN MRN: [id number] DOB/AGE: Nov 22, 1955 58 y.o.  Admit date: 09/15/2014 Discharge date: 09/22/2014  Discharge Diagnoses Patient Active Problem List   Diagnosis Date Noted  . Fall from ladder 09/21/2014  . L2 vertebral fracture 09/21/2014  . Bilateral calcaneal fractures 09/21/2014  . Chronic pain 09/21/2014  . Acute blood loss anemia 09/21/2014  . Open right calcaneal fracture 09/15/2014    Consultants Dr. Sharol Given (Orthopedics) Dr. Percell Miller (Orthopedics) Dr. Dava Najjar (Rehab) Dr. Joya Salm (Neurosurgery)  Procedures Dr. Percell Miller (09/15/14) - I&D right ankle (open fracture)  Dr. Sharol Given (09/19/14) - ORIF B/L calcaneous fractures  Hospital Course:  58 yo white male who was trying to cut a limb in a tree today and fell off his ladder about 40 feet. He landed on his feet and instantly had horrible back and bilateral ankle pain. He was brought in by EMS as a level II. He was found to have an open right calcaneal fracture as well as an L2 fracture. Subsequently was found to have b/l calcaneal fractures  Dr. Percell Miller was consulted on the patient and recommended I&D right open ankle fracture.  Patient was admitted and underwent procedure listed above.  Tolerated procedure well and was transferred to the SDU for monitoring.  He was seen by Dr. Joya Salm regarding his L2 fracture and he recommended TLSO brace with mobilization.  He was transferred to med-surg 5N on 09/17/14.  Diet was advanced as tolerated.  Post-operative pain control was difficult due the patients chronic pain issues.  PT/OT saw the patient and recommended CIR however the patient does not have help at home as his wife does not live with him.  Therefore, SNF is his only option.  On HD #7, the patient was voiding well, tolerating diet, ambulating well, pain well controlled, vital signs stable, and felt stable for discharge home.  Patient will follow up in  our office as needed and knows to call with questions or concerns.  He will follow up with Dr. Sharol Given and Dr. Joya Salm.   Physical Exam: General: pleasant, WD/WN white male who is laying in bed in NAD HEENT: head is normocephalic, atraumatic.  Sclera are noninjected.  PERRL.  Ears and nose without any masses or lesions.  Mouth is pink and moist Heart: regular, rate, and rhythm.  Normal s1,s2. No obvious murmurs, gallops, or rubs noted.  Palpable radial and pedal pulses bilaterally Lungs: CTAB, no wheezes, rhonchi, or rales noted.  Respiratory effort nonlabored Abd: soft, NT/ND, +BS, no masses, hernias, or organomegaly MS: BLE with dressings in place, NVI Skin: warm and dry with no masses, lesions, or rashes Psych: A&Ox3 with an appropriate affect.     Medication List    STOP taking these medications        BC HEADACHE POWDER PO     famotidine 20 MG tablet  Commonly known as:  PEPCID     oxyCODONE-acetaminophen 5-325 MG per tablet  Commonly known as:  PERCOCET/ROXICET      TAKE these medications        aspirin-acetaminophen-caffeine 250-250-65 MG per tablet  Commonly known as:  EXCEDRIN MIGRAINE  Take 1 tablet by mouth daily as needed for headache.     BIOFREEZE 4 % Gel  Generic drug:  Menthol (Topical Analgesic)  Apply 1 application topically as needed (Pain).     diazepam 5 MG tablet  Commonly known as:  VALIUM  Take 1 tablet (5 mg total) by mouth  every 6 (six) hours as needed. For muscle pain     ibuprofen 200 MG tablet  Commonly known as:  ADVIL,MOTRIN  Take 200 mg by mouth daily as needed for headache.     oxyCODONE 5 MG immediate release tablet  Commonly known as:  Oxy IR/ROXICODONE  Take 1-2 tablets (5-10 mg total) by mouth every 3 (three) hours as needed for moderate pain, severe pain or breakthrough pain.     OxyCODONE 20 mg T12a 12 hr tablet  Commonly known as:  OXYCONTIN  Take 1 tablet (20 mg total) by mouth every 12 (twelve) hours.     pregabalin 75 MG  capsule  Commonly known as:  LYRICA  Take 1 capsule (75 mg total) by mouth 2 (two) times daily.     ranitidine 150 MG tablet  Commonly known as:  ZANTAC  Take 150 mg by mouth 2 (two) times daily.     traMADol 50 MG tablet  Commonly known as:  ULTRAM  Take 2 tablets (100 mg total) by mouth every 6 (six) hours.         Follow-up Information    Follow up with DUDA,MARCUS V, MD In 2 weeks.   Specialty:  Orthopedic Surgery   Why:  For post-operation check regarding your right and left calcaneal fractures   Contact information:   [address] [place] [address] [phone]       Follow up with Floyce Stakes, MD. Schedule an appointment as soon as possible for a visit in 2 weeks.   Specialty:  Neurosurgery   Why:  For post-hospital follow up regarding your L2 back fracture   Contact information:   [place] [address] [phone]       Follow up with [facility].   Why:  As needed   Contact information:   [place] [address] [phone]       Follow up with Delphina Cahill, MD. Schedule an appointment as soon as possible for a visit in 2 weeks.   Specialty:  Internal Medicine   Why:  For post-hospital follow up   Contact information:    [place] [address] [phone]       Signed: Saverio Danker, [facility]  Trauma Service [phone]  09/22/2014, 12:51 PM

## 2014-09-21 NOTE — Progress Notes (Signed)
Patient ID: Bradley Espinoza, male   DOB: July 05, 1956, 58 y.o.   MRN: [id number] 2 Days Post-Op  Subjective: More pain in heels earlier this AM, now up in chair with legs elevated by PT. Had BM this AM.  Objective: Vital signs in last 24 hours: Temp:  [98.3 F (36.8 C)-98.4 F (36.9 C)] 98.3 F (36.8 C) (12/15 0540) Pulse Rate:  [77-79] 77 (12/15 0540) Resp:  [18] 18 (12/14 1130) BP: (136-140)/(69-81) 136/69 mmHg (12/15 0540) SpO2:  [95 %-98 %] 95 % (12/15 0540) Last BM Date: 09/17/14  Intake/Output from previous day: 12/14 0701 - 12/15 0700 In: 600 [P.O.:600] Out: 3000 [Urine:3000] Intake/Output this shift: Total I/O In: 240 [P.O.:240] Out: 201 [Urine:200; Stool:1]  General appearance: cooperative Resp: clear to auscultation bilaterally Cardio: regular rate and rhythm GI: soft, NT, TLSO on Extremities: B foot ortho dressings  Lab Results: CBC  No results for input(s): WBC, HGB, HCT, PLT in the last 72 hours. BMET No results for input(s): NA, K, CL, CO2, GLUCOSE, BUN, CREATININE, CALCIUM in the last 72 hours. PT/INR No results for input(s): LABPROT, INR in the last 72 hours. ABG No results for input(s): PHART, HCO3 in the last 72 hours.  Invalid input(s): PCO2, PO2  Studies/Results: No results found.  Anti-infectives: Anti-infectives    Start     Dose/Rate Route Frequency Ordered Stop   09/19/14 1200  ceFAZolin (ANCEF) IVPB 1 g/50 mL premix     1 g100 mL/hr over 30 Minutes Intravenous Every 6 hours 09/19/14 1157 09/20/14 0044   09/18/14 1017  ceFAZolin (ANCEF) IVPB 2 g/50 mL premix  Status:  Discontinued     2 g100 mL/hr over 30 Minutes Intravenous On call to O.R. 09/18/14 1017 09/19/14 1157   09/16/14 0200  ceFAZolin (ANCEF) IVPB 2 g/50 mL premix     2 g100 mL/hr over 30 Minutes Intravenous Every 6 hours 09/16/14 0024 09/18/14 2139   09/15/14 1345  ceFAZolin (ANCEF) IVPB 1 g/50 mL premix     1 g100 mL/hr over 30 Minutes Intravenous  Once 09/15/14 1340  09/15/14 1618      Assessment/Plan: Fall off ladder - trimming trees L2 fx -- mobilize in TLSO per Dr. Joya Salm Bilateral calcaneal fxs, right open s/p I&D, ex fix -- bilateral ORIF by Dr. Sharol Given; NWB BLE ABL anemia -- stabilizing and improving Chronic pain -- previous back surgery FEN -- scheduled Ultram and Oxycontin VTE -- SCD's, Lovenox Dispo - CIR pending OT eval and insurance approval  LOS: 6 days    Georganna Skeans, MD, MPH, FACS Trauma: [phone] General Surgery: [phone]  09/21/2014

## 2014-09-21 NOTE — Evaluation (Signed)
Occupational Therapy Evaluation Patient Details Name: Bradley Espinoza MRN: [id number] DOB: September 07, 1956 Today's Date: 09/21/2014    History of Present Illness Patient is a 58 year old gentleman who states that he was up on a ladder approximately 30 feet when he was trimming a tree branch with a chain saw when the branch snapped he states he was thrown 10 feet in the air and landed on both feet. Patient states he had immediate onset of lower back pain. Patient is status post lumbar spine fusion.  Pt suffered B calcaneal fractures and L2 fracture.   Clinical Impression   Pt is a 58 y/o male whom currently requires +2 total/physical assist for functional transfers and LB ADL's self care tasks. Pain is also a limiting factor. He will benefit from acute OT followed by In-pt Rehab to assist in maximizing I w/ ADL/self care and functional tranfers related to ADL's. He will require a drop arm commode in his room as well as w/c for transfers due to NWB bilat LE's. Apply TLSO in supine.    Follow Up Recommendations  CIR;Supervision/Assistance - 24 hour    Equipment Recommendations  Other (comment) (Defer to next venue - will need drop arm commode due to NWB bilat LE's, tub bench)    Recommendations for Other Services       Precautions / Restrictions Precautions Precautions: Back Precaution Comments: Educated on back precautions and use of brace. Required Braces or Orthoses: Spinal Brace Spinal Brace: Thoracolumbosacral orthotic;Applied in supine position Restrictions Weight Bearing Restrictions: Yes RLE Weight Bearing: Non weight bearing LLE Weight Bearing: Non weight bearing      Mobility Bed Mobility Overal bed mobility:  (Pt received up in chair today) Bed Mobility: Rolling;Sidelying to Sit Rolling: Min assist Sidelying to sit: Mod assist;+2 for physical assistance       General bed mobility comments: 1 person to manage LEs due to increased pain, 2nd person to assist with trunk  elevation  Transfers Overall transfer level: Needs assistance Equipment used:  (2 person assist) Transfers: Comptroller transfers: +2 physical assistance;Mod assist   General transfer comment: pt able to use UEs to lift, PT assisted in elevation to clear pilloqs with bed pad. Rehab tech assisted with LE management    Balance Overall balance assessment: Needs assistance Sitting-balance support: Bilateral upper extremity supported Sitting balance-Leahy Scale: Poor Sitting balance - Comments: strong posterior lean due to TLSO                                    ADL Overall ADL's : Needs assistance/impaired Eating/Feeding: Set up;Sitting;Bed level   Grooming: Wash/dry hands;Wash/dry face;Oral care;Brushing hair;Set up;Sitting       Lower Body Bathing: Bed level;Cueing for back precautions;Adhering to back precautions;Moderate assistance   Upper Body Dressing : Minimal assistance;Set up;Bed level   Lower Body Dressing: Total assistance;+2 for physical assistance;Bed level;Adhering to back precautions   Toilet Transfer: Requires drop arm;+2 for physical assistance;Total assistance;BSC (Pt will require w/c to drop arm 3:1, currently using bed pan/bed level)   Toileting- Clothing Manipulation and Hygiene: Total assistance;+2 for physical assistance;Bed level   Tub/ Shower Transfer:  (TBA)   Functional mobility during ADLs: Total assistance;+2 for physical assistance;+2 for safety/equipment;Cueing for safety;Cueing for sequencing (Pt is currently total A +2 for transfers anterior to posterior due to NWB bilat LE's.) General ADL Comments: Pt is a 58 y/o  male whom currently requires +2 total/physical assist for functional transfers and LB ADL's self care tasks. Pain is also a limiting factor. He will benefit from acute OT followed by In-pt Rehab to assist in maximizing I w/ ADL/self care and functional tranfers related to ADL's. He  will require a drop arm commode in his room  as well as w/c for transfers due to NWB bilat LE's. Pt participated in ADL retraining session this am for grooming while sitting up in chair. Pt was also educated in role of OT and POC..      Vision  Wears glasses at all times. No change from baseline.                   Perception     Praxis      Pertinent Vitals/Pain Pain Assessment: 0-10 Pain Score: 9  Pain Location: Feet Pain Descriptors / Indicators: Burning;Constant Pain Intervention(s): Premedicated before session     Hand Dominance Right   Extremity/Trunk Assessment Upper Extremity Assessment Upper Extremity Assessment: Overall WFL for tasks assessed   Lower Extremity Assessment Lower Extremity Assessment: Defer to PT evaluation;RLE deficits/detail;LLE deficits/detail RLE: Unable to fully assess due to pain LLE: Unable to fully assess due to pain       Communication Communication Communication: No difficulties   Cognition Arousal/Alertness: Awake/alert Behavior During Therapy: WFL for tasks assessed/performed Overall Cognitive Status: Within Functional Limits for tasks assessed                     General Comments                    Home Living Family/patient expects to be discharged to:: Inpatient rehab Living Arrangements: Alone                                      Prior Functioning/Environment Level of Independence: Independent        Comments: Pt reports he was on disability due to multiple back surgeries.    OT Diagnosis: Generalized weakness;Acute pain   OT Problem List: Decreased strength;Decreased activity tolerance;Impaired balance (sitting and/or standing);Decreased knowledge of use of DME or AE;Pain   OT Treatment/Interventions: Self-care/ADL training;Therapeutic exercise;Energy conservation;DME and/or AE instruction;Patient/family education;Therapeutic activities    OT Goals(Current goals can be found in  the care plan section) Acute Rehab OT Goals Patient Stated Goal: Go to the bathroom and decrease pain Time For Goal Achievement: 10/05/14 Potential to Achieve Goals: Good  OT Frequency: Min 2X/week   Barriers to D/C: Other (comment) (Lives alone)          Co-evaluation              End of Session Equipment Utilized During Treatment: Back brace;Other (comment) (NWB bilateral LE's)  Activity Tolerance: Patient limited by pain Patient left: in chair;with call bell/phone within reach   Time: 0928-1000 OT Time Calculation (min): 32 min Charges:  OT General Charges $OT Visit: 1 Procedure OT Evaluation $Initial OT Evaluation Tier I: 1 Procedure OT Treatments $Self Care/Home Management : 8-22 mins (20 min) G-Codes:    Josephine Igo Dixon 09/21/2014, 10:16 AM

## 2014-09-21 NOTE — Progress Notes (Signed)
I met with pt at bedside. He and his wife do not live together. His wife works for [employer] does not have w/c accessible home nor does he have assistance at home after a short rehab stay. He will have prolonged limitations with his NWB status. He will need SNF rehab and he is in agreement . I will alert SW. [phone]

## 2014-09-21 NOTE — Progress Notes (Signed)
Patient ID: Bradley Espinoza, male   DOB: Jul 23, 1956, 58 y.o.   MRN: [id number] Patient complains of increased pain in both feet after sitting in a chair with his legs dependent. Patient is moving his toes and ankles well without pain. Discussed the importance of elevation. Patient  discharge to inpatient versus outpatient rehabilitation.

## 2014-09-22 ENCOUNTER — Inpatient Hospital Stay (HOSPITAL_COMMUNITY): Payer: 59

## 2014-09-22 ENCOUNTER — Inpatient Hospital Stay
Admission: RE | Admit: 2014-09-22 | Discharge: 2014-10-09 | Disposition: A | Discharge status: Nursing Home (Includes ICF) | Payer: 59 | Source: Ambulatory Visit | Attending: Internal Medicine | Admitting: Internal Medicine

## 2014-09-22 MED ORDER — OXYCODONE HCL ER 20 MG PO T12A: 20.0000 mg | EXTENDED_RELEASE_TABLET | Freq: Two times a day (BID) | ORAL | Status: DC

## 2014-09-22 MED ORDER — TRAMADOL HCL 50 MG PO TABS: 100.0000 mg | ORAL_TABLET | Freq: Four times a day (QID) | ORAL | Status: DC

## 2014-09-22 MED ORDER — DIAZEPAM 5 MG PO TABS
5.0000 mg | ORAL_TABLET | Freq: Four times a day (QID) | ORAL | Status: DC | PRN
  Administered 2014-09-22: 5 mg via ORAL
  Filled 2014-09-22: qty 1

## 2014-09-22 MED ORDER — OXYCODONE HCL 5 MG PO TABS: 5.0000 mg | ORAL_TABLET | ORAL | Status: DC | PRN

## 2014-09-22 MED ORDER — DIAZEPAM 5 MG PO TABS: 5.0000 mg | ORAL_TABLET | Freq: Four times a day (QID) | ORAL | Status: DC | PRN

## 2014-09-22 MED ORDER — PREGABALIN 75 MG PO CAPS: 75.0000 mg | ORAL_CAPSULE | Freq: Two times a day (BID) | ORAL | Status: DC

## 2014-09-22 NOTE — Progress Notes (Signed)
Patient ID: Bradley Espinoza, male   DOB: Feb 06, 1956, 58 y.o.   MRN: [id number] 3 Days Post-Op  Subjective: Pt c/o pain in his heels and his back.  Repeat back films today are stable.  Otherwise doing ok  Objective: Vital signs in last 24 hours: Temp:  [98.4 F (36.9 C)-98.6 F (37 C)] 98.4 F (36.9 C) (12/16 0508) Pulse Rate:  [73-78] 78 (12/16 0508) Resp:  [17-18] 18 (12/16 0508) BP: (141-149)/(72-75) 141/72 mmHg (12/16 0508) SpO2:  [95 %-96 %] 95 % (12/16 0508) FiO2 (%):  [2 %] 2 % (12/15 2156) Last BM Date: 09/21/14  Intake/Output from previous day: 12/15 0701 - 12/16 0700 In: 720 [P.O.:720] Out: 2401 [Urine:2400; Stool:1] Intake/Output this shift: Total I/O In: -  Out: 400 [Urine:400]  PE: Gen: NAD Heart: regular Lungs: CTAB Abd: soft, NT, ND Ext: both ankles are wrapped in dressings.  NVI and able to move his toes  Lab Results:  No results for input(s): WBC, HGB, HCT, PLT in the last 72 hours. BMET No results for input(s): NA, K, CL, CO2, GLUCOSE, BUN, CREATININE, CALCIUM in the last 72 hours. PT/INR No results for input(s): LABPROT, INR in the last 72 hours. CMP     Component Value Date/Time   NA 135* 09/16/2014 0422   K 4.1 09/16/2014 0422   CL 99 09/16/2014 0422   CO2 22 09/16/2014 0422   GLUCOSE 124* 09/16/2014 0422   BUN 13 09/16/2014 0422   CREATININE 0.72 09/16/2014 0422   CALCIUM 8.6 09/16/2014 0422   PROT 7.0 09/15/2014 1315   ALBUMIN 4.0 09/15/2014 1315   AST 23 09/15/2014 1315   ALT 24 09/15/2014 1315   ALKPHOS 80 09/15/2014 1315   BILITOT 0.7 09/15/2014 1315   GFRNONAA >90 09/16/2014 0422   GFRAA >90 09/16/2014 0422   Lipase  No results found for: LIPASE     Studies/Results: Dg Lumbar Spine 2-3 Views  09/22/2014   CLINICAL DATA:  Evaluate L2 fracture - pt fell 68ft from ladder 1 week ago - also broke both feet as well - past hx of lower back surgery  EXAM: LUMBAR SPINE - 2-3 VIEW  COMPARISON:  09/15/2014  FINDINGS: Moderate  compression fracture of L2 is stable from the prior study. No new fractures. Posterior fusion hardware from L4 through S1 is well-seated and aligned and without change.  Soft tissues are unremarkable other than stable aortic calcifications.  IMPRESSION: Moderate compression fracture of L2, without change from the recent prior study. No new fractures.   Electronically Signed   By: Lajean Manes M.D.   On: 09/22/2014 11:44    Anti-infectives: Anti-infectives    Start     Dose/Rate Route Frequency Ordered Stop   09/19/14 1200  ceFAZolin (ANCEF) IVPB 1 g/50 mL premix     1 g100 mL/hr over 30 Minutes Intravenous Every 6 hours 09/19/14 1157 09/20/14 0044   09/18/14 1017  ceFAZolin (ANCEF) IVPB 2 g/50 mL premix  Status:  Discontinued     2 g100 mL/hr over 30 Minutes Intravenous On call to O.R. 09/18/14 1017 09/19/14 1157   09/16/14 0200  ceFAZolin (ANCEF) IVPB 2 g/50 mL premix     2 g100 mL/hr over 30 Minutes Intravenous Every 6 hours 09/16/14 0024 09/18/14 2139   09/15/14 1345  ceFAZolin (ANCEF) IVPB 1 g/50 mL premix     1 g100 mL/hr over 30 Minutes Intravenous  Once 09/15/14 1340 09/15/14 1618       Assessment/Plan   Fall  off ladder - trimming trees L2 fx -- mobilize in TLSO per Dr. Joya Salm, repeat lumbar films stable today Bilateral calcaneal fxs, right open s/p I&D, ex fix -- bilateral ORIF by Dr. Sharol Given; NWB BLE ABL anemia -- stable Chronic pain -- previous back surgery FEN -- scheduled Ultram and Oxycontin, patient insists he takes 30mg  of OxyContin at home; however, we have contacted his pharmacy and verified that his last prescription was filled at oxy 15mg  BID.  Therefore, I will not increase to his request of 30mg .  He has ultram, oxycodone, and oxycontin, as well as valium.  I did change his robaxin to valium today as he does take this at home and prefers that. VTE -- SCD's, Lovenox Dispo - awaiting SNF.  [place] for DC to SNF when bed available.  Ex-wife wants [facility]  LOS: 7 days     Zerina Hallinan E 09/22/2014, 12:39 PM Pager: [phone]

## 2014-09-22 NOTE — Discharge Instructions (Signed)
Must put TLSO brace on while supine and where at all times when sitting up or out of bed Change dressings to his feet daily and prn with dry dressings NWB to BLE

## 2014-09-22 NOTE — Progress Notes (Signed)
Patient ID: Bradley Espinoza, male   DOB: Feb 15, 1956, 58 y.o.   MRN: [id number] pain bilateral feet decreasing, change dressing today, plan for SNF

## 2014-09-22 NOTE — Clinical Social Work Note (Signed)
Pt to be discharged to [facility]. Pt and pt's wife updated regarding discharge.  Facility: [facility] Report number: [phone] Transportation: EMS ([address]  Lubertha Sayres, [place] ([phone]) Licensed Clinical Social Worker Orthopedics [phone]) and Surgical [phone])

## 2014-09-22 NOTE — Clinical Social Work Placement (Addendum)
Clinical Social Work Department CLINICAL SOCIAL WORK PLACEMENT NOTE 09/22/2014  Patient:  Bradley Espinoza, Bradley Espinoza  Account Number:  [id number] Admit date:  09/15/2014  Clinical Social Worker:  Delrae Sawyers  Date/time:  09/22/2014 12:18 PM  Clinical Social Work is seeking post-discharge placement for this patient at the following level of care:   [place]   (*CSW will update this form in Epic as items are completed)   09/21/2014  Patient/family provided with [place] Department of Clinical Social Work's list of facilities offering this level of care within the geographic area requested by the patient (or if unable, by the patient's family).  09/21/2014  Patient/family informed of their freedom to choose among providers that offer the needed level of care, that participate in Medicare, Medicaid or managed care program needed by the patient, have an available bed and are willing to accept the patient.  09/21/2014  Patient/family informed of [facility]' ownership interest in [facility], as well as of the fact that they are under no obligation to receive care at this facility.  PASARR submitted to EDS on 09/21/2014 PASARR number received on 09/21/2014  FL2 transmitted to all facilities in geographic area requested by pt/family on  09/21/2014 FL2 transmitted to all facilities within larger geographic area on   Patient informed that his/her managed care company has contracts with or will negotiate with  certain facilities, including the following:     Patient/family informed of bed offers received:  09/22/2014 Patient chooses bed at [facility] Physician recommends and patient chooses bed at    Patient to be transferred to [facility] on  09/22/2014 Patient to be transferred to facility by [facility] Patient and family notified of transfer on 09/22/2014 Name of family member notified:  Pt and pt's wife updated regarding discharge.  The following  physician request were entered in Epic:   Additional Comments:  [facility] [place] [phone]) Licensed Clinical Social Worker Orthopedics [phone]) and Surgical [phone])

## 2014-09-22 NOTE — Progress Notes (Signed)
Waiting for placement. To get rays today

## 2014-09-23 ENCOUNTER — Other Ambulatory Visit: Payer: Self-pay | Admitting: *Deleted

## 2014-09-23 MED ORDER — OXYCODONE HCL 5 MG PO TABS: 5.0000 mg | ORAL_TABLET | ORAL | Status: DC | PRN

## 2014-09-23 MED ORDER — OXYCODONE HCL ER 20 MG PO T12A: EXTENDED_RELEASE_TABLET | ORAL | Status: DC

## 2014-09-23 NOTE — Telephone Encounter (Signed)
[facility] 

## 2014-09-26 ENCOUNTER — Non-Acute Institutional Stay (SKILLED_NURSING_FACILITY): Payer: 59 | Admitting: Internal Medicine

## 2014-09-26 DIAGNOSIS — M545 Low back pain, unspecified: Secondary | ICD-10-CM

## 2014-09-26 DIAGNOSIS — S92009S Unspecified fracture of unspecified calcaneus, sequela: Secondary | ICD-10-CM

## 2014-09-26 DIAGNOSIS — S32029D Unspecified fracture of second lumbar vertebra, subsequent encounter for fracture with routine healing: Secondary | ICD-10-CM

## 2014-09-28 ENCOUNTER — Other Ambulatory Visit: Payer: Self-pay | Admitting: *Deleted

## 2014-09-28 MED ORDER — OXYCODONE HCL 5 MG PO TABS: 5.0000 mg | ORAL_TABLET | ORAL | Status: DC | PRN

## 2014-09-28 NOTE — Telephone Encounter (Signed)
[facility] 

## 2014-09-29 ENCOUNTER — Non-Acute Institutional Stay (SKILLED_NURSING_FACILITY): Payer: 59 | Admitting: Internal Medicine

## 2014-09-29 ENCOUNTER — Encounter: Payer: Self-pay | Admitting: Internal Medicine

## 2014-09-29 DIAGNOSIS — S92001D Unspecified fracture of right calcaneus, subsequent encounter for fracture with routine healing: Secondary | ICD-10-CM

## 2014-09-29 DIAGNOSIS — G8929 Other chronic pain: Secondary | ICD-10-CM

## 2014-09-29 DIAGNOSIS — S92002D Unspecified fracture of left calcaneus, subsequent encounter for fracture with routine healing: Secondary | ICD-10-CM
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
## 2014-10-05 ENCOUNTER — Non-Acute Institutional Stay (SKILLED_NURSING_FACILITY): Payer: 59 | Admitting: Internal Medicine

## 2014-10-05 DIAGNOSIS — L03115 Cellulitis of right lower limb: Secondary | ICD-10-CM

## 2014-10-05 DIAGNOSIS — R300 Dysuria: Secondary | ICD-10-CM

## 2014-10-05 DIAGNOSIS — L0390 Cellulitis, unspecified: Secondary | ICD-10-CM | POA: Insufficient documentation

## 2014-10-05 DIAGNOSIS — G8929 Other chronic pain: Secondary | ICD-10-CM

## 2014-10-05 DIAGNOSIS — S92001D Unspecified fracture of right calcaneus, subsequent encounter for fracture with routine healing: Secondary | ICD-10-CM
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
## 2014-10-07 ENCOUNTER — Other Ambulatory Visit (HOSPITAL_COMMUNITY): Payer: Self-pay | Admitting: Orthopedic Surgery

## 2014-10-07 ENCOUNTER — Encounter (HOSPITAL_COMMUNITY): Payer: Self-pay | Admitting: *Deleted
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
## 2014-10-09 ENCOUNTER — Inpatient Hospital Stay (HOSPITAL_COMMUNITY): Payer: 59 | Admitting: Anesthesiology

## 2014-10-09 ENCOUNTER — Encounter (HOSPITAL_COMMUNITY)
Admission: RE | Disposition: A | Discharge status: Skilled Nursing Facility | Payer: Self-pay | Source: Ambulatory Visit | Attending: Orthopedic Surgery

## 2014-10-09 ENCOUNTER — Encounter (HOSPITAL_COMMUNITY): Payer: Self-pay | Admitting: Anesthesiology

## 2014-10-09 ENCOUNTER — Observation Stay (HOSPITAL_COMMUNITY)
Admission: RE | Admit: 2014-10-09 | Discharge: 2014-10-10 | Disposition: A | Discharge status: Skilled Nursing Facility | Payer: 59 | Source: Ambulatory Visit | Attending: Orthopedic Surgery | Admitting: Orthopedic Surgery

## 2014-10-09 DIAGNOSIS — Y838 Other surgical procedures as the cause of abnormal reaction of the patient, or of later complication, without mention of misadventure at the time of the procedure: Secondary | ICD-10-CM | POA: Insufficient documentation

## 2014-10-09 DIAGNOSIS — M1990 Unspecified osteoarthritis, unspecified site: Secondary | ICD-10-CM | POA: Diagnosis not present

## 2014-10-09 DIAGNOSIS — F329 Major depressive disorder, single episode, unspecified: Secondary | ICD-10-CM | POA: Diagnosis not present

## 2014-10-09 DIAGNOSIS — F17210 Nicotine dependence, cigarettes, uncomplicated: Secondary | ICD-10-CM | POA: Insufficient documentation

## 2014-10-09 DIAGNOSIS — Y92234 Operating room of hospital as the place of occurrence of the external cause: Secondary | ICD-10-CM | POA: Diagnosis not present

## 2014-10-09 DIAGNOSIS — M86179 Other acute osteomyelitis, unspecified ankle and foot: Secondary | ICD-10-CM | POA: Diagnosis present

## 2014-10-09 DIAGNOSIS — Z7982 Long term (current) use of aspirin: Secondary | ICD-10-CM | POA: Diagnosis not present

## 2014-10-09 DIAGNOSIS — T847XXA Infection and inflammatory reaction due to other internal orthopedic prosthetic devices, implants and grafts, initial encounter: Secondary | ICD-10-CM | POA: Diagnosis not present

## 2014-10-09 DIAGNOSIS — Z79899 Other long term (current) drug therapy: Secondary | ICD-10-CM | POA: Diagnosis not present

## 2014-10-09 DIAGNOSIS — F419 Anxiety disorder, unspecified: Secondary | ICD-10-CM | POA: Insufficient documentation

## 2014-10-09 DIAGNOSIS — T8469XA Infection and inflammatory reaction due to internal fixation device of other site, initial encounter: Secondary | ICD-10-CM | POA: Diagnosis present

## 2014-10-09 HISTORY — PX: HARDWARE REMOVAL: SHX979

## 2014-10-09 HISTORY — DX: Other injury of unspecified body region, initial encounter: T14.8XXA

## 2014-10-09 SURGERY — REMOVAL, HARDWARE
Anesthesia: General | Site: Ankle | Laterality: Right

## 2014-10-09 MED ORDER — ASPIRIN EC 325 MG PO TBEC
325.0000 mg | DELAYED_RELEASE_TABLET | Freq: Every day | ORAL | Status: DC
  Administered 2014-10-09 – 2014-10-10 (×2): 325 mg via ORAL
  Filled 2014-10-09 (×2): qty 1

## 2014-10-09 MED ORDER — VANCOMYCIN HCL 500 MG IV SOLR
INTRAVENOUS | Status: AC
  Filled 2014-10-09: qty 500

## 2014-10-09 MED ORDER — ONDANSETRON HCL 4 MG/2ML IJ SOLN
INTRAMUSCULAR | Status: DC | PRN
  Administered 2014-10-09: 4 mg via INTRAVENOUS

## 2014-10-09 MED ORDER — METHOCARBAMOL 500 MG PO TABS
500.0000 mg | ORAL_TABLET | Freq: Four times a day (QID) | ORAL | Status: DC | PRN
  Administered 2014-10-09 – 2014-10-10 (×4): 500 mg via ORAL
  Filled 2014-10-09 (×4): qty 1

## 2014-10-09 MED ORDER — DOCUSATE SODIUM 100 MG PO CAPS
100.0000 mg | ORAL_CAPSULE | Freq: Two times a day (BID) | ORAL | Status: DC
  Administered 2014-10-09 – 2014-10-10 (×3): 100 mg via ORAL
  Filled 2014-10-09 (×4): qty 1

## 2014-10-09 MED ORDER — LIDOCAINE HCL (CARDIAC) 20 MG/ML IV SOLN
INTRAVENOUS | Status: AC
  Filled 2014-10-09: qty 5

## 2014-10-09 MED ORDER — DEXAMETHASONE SODIUM PHOSPHATE 4 MG/ML IJ SOLN
INTRAMUSCULAR | Status: DC | PRN
  Administered 2014-10-09: 4 mg via INTRAVENOUS

## 2014-10-09 MED ORDER — ONDANSETRON HCL 4 MG PO TABS: 4.0000 mg | ORAL_TABLET | Freq: Four times a day (QID) | ORAL | Status: DC | PRN

## 2014-10-09 MED ORDER — METOCLOPRAMIDE HCL 10 MG PO TABS: 5.0000 mg | ORAL_TABLET | Freq: Three times a day (TID) | ORAL | Status: DC | PRN

## 2014-10-09 MED ORDER — FENTANYL CITRATE 0.05 MG/ML IJ SOLN
INTRAMUSCULAR | Status: DC | PRN
  Administered 2014-10-09 (×10): 50 ug via INTRAVENOUS

## 2014-10-09 MED ORDER — FENTANYL CITRATE 0.05 MG/ML IJ SOLN
INTRAMUSCULAR | Status: AC
  Filled 2014-10-09: qty 5

## 2014-10-09 MED ORDER — MIDAZOLAM HCL 2 MG/2ML IJ SOLN
INTRAMUSCULAR | Status: AC
  Filled 2014-10-09: qty 2

## 2014-10-09 MED ORDER — SENNOSIDES-DOCUSATE SODIUM 8.6-50 MG PO TABS: 1.0000 | ORAL_TABLET | Freq: Every evening | ORAL | Status: DC | PRN

## 2014-10-09 MED ORDER — CEFAZOLIN SODIUM-DEXTROSE 2-3 GM-% IV SOLR
INTRAVENOUS | Status: AC
  Filled 2014-10-09: qty 50

## 2014-10-09 MED ORDER — SODIUM CHLORIDE 0.9 % IV SOLN: INTRAVENOUS | Status: DC

## 2014-10-09 MED ORDER — CEFAZOLIN SODIUM-DEXTROSE 2-3 GM-% IV SOLR
2.0000 g | INTRAVENOUS | Status: AC
  Administered 2014-10-09: 2 g via INTRAVENOUS

## 2014-10-09 MED ORDER — PROPOFOL 10 MG/ML IV BOLUS
INTRAVENOUS | Status: DC | PRN
  Administered 2014-10-09: 200 mg via INTRAVENOUS

## 2014-10-09 MED ORDER — PROPOFOL 10 MG/ML IV BOLUS
INTRAVENOUS | Status: AC
  Filled 2014-10-09: qty 20

## 2014-10-09 MED ORDER — ONDANSETRON HCL 4 MG/2ML IJ SOLN
INTRAMUSCULAR | Status: AC
  Filled 2014-10-09: qty 2

## 2014-10-09 MED ORDER — VANCOMYCIN HCL 500 MG IV SOLR
INTRAVENOUS | Status: DC | PRN
  Administered 2014-10-09: 500 mg

## 2014-10-09 MED ORDER — HYDROMORPHONE HCL 1 MG/ML IJ SOLN: 0.5000 mg | INTRAMUSCULAR | Status: DC | PRN

## 2014-10-09 MED ORDER — GENTAMICIN SULFATE 40 MG/ML IJ SOLN
INTRAMUSCULAR | Status: AC
  Filled 2014-10-09: qty 4

## 2014-10-09 MED ORDER — ONDANSETRON HCL 4 MG/2ML IJ SOLN: 4.0000 mg | Freq: Four times a day (QID) | INTRAMUSCULAR | Status: DC | PRN

## 2014-10-09 MED ORDER — CEFAZOLIN SODIUM 1-5 GM-% IV SOLN
1.0000 g | Freq: Four times a day (QID) | INTRAVENOUS | Status: DC
  Administered 2014-10-09 – 2014-10-10 (×4): 1 g via INTRAVENOUS
  Filled 2014-10-09 (×7): qty 50

## 2014-10-09 MED ORDER — BISACODYL 5 MG PO TBEC: 5.0000 mg | DELAYED_RELEASE_TABLET | Freq: Every day | ORAL | Status: DC | PRN

## 2014-10-09 MED ORDER — OXYCODONE HCL ER 15 MG PO T12A
30.0000 mg | EXTENDED_RELEASE_TABLET | Freq: Two times a day (BID) | ORAL | Status: DC
  Administered 2014-10-09 – 2014-10-10 (×3): 30 mg via ORAL
  Filled 2014-10-09 (×3): qty 2

## 2014-10-09 MED ORDER — DEXAMETHASONE SODIUM PHOSPHATE 4 MG/ML IJ SOLN
INTRAMUSCULAR | Status: AC
  Filled 2014-10-09: qty 1

## 2014-10-09 MED ORDER — HYDROMORPHONE HCL 1 MG/ML IJ SOLN
INTRAMUSCULAR | Status: AC
Start: 2014-10-09 — End: 2014-10-09
  Administered 2014-10-09: 0.5 mg via INTRAVENOUS
  Filled 2014-10-09: qty 1

## 2014-10-09 MED ORDER — MIDAZOLAM HCL 5 MG/5ML IJ SOLN
INTRAMUSCULAR | Status: DC | PRN
  Administered 2014-10-09: 2 mg via INTRAVENOUS

## 2014-10-09 MED ORDER — PROMETHAZINE HCL 25 MG/ML IJ SOLN: 6.2500 mg | INTRAMUSCULAR | Status: DC | PRN

## 2014-10-09 MED ORDER — METHOCARBAMOL 1000 MG/10ML IJ SOLN
500.0000 mg | Freq: Four times a day (QID) | INTRAVENOUS | Status: DC | PRN
  Filled 2014-10-09: qty 5

## 2014-10-09 MED ORDER — LIDOCAINE HCL (CARDIAC) 20 MG/ML IV SOLN
INTRAVENOUS | Status: DC | PRN
  Administered 2014-10-09: 100 mg via INTRAVENOUS

## 2014-10-09 MED ORDER — OXYCODONE-ACETAMINOPHEN 5-325 MG PO TABS
1.0000 | ORAL_TABLET | ORAL | Status: DC | PRN
  Administered 2014-10-09 – 2014-10-10 (×3): 2 via ORAL
  Filled 2014-10-09 (×3): qty 2

## 2014-10-09 MED ORDER — HYDROMORPHONE HCL 1 MG/ML IJ SOLN
0.2500 mg | INTRAMUSCULAR | Status: DC | PRN
  Administered 2014-10-09 (×4): 0.5 mg via INTRAVENOUS

## 2014-10-09 MED ORDER — GENTAMICIN SULFATE 40 MG/ML IJ SOLN
INTRAMUSCULAR | Status: DC | PRN
  Administered 2014-10-09: 160 mg via INTRAMUSCULAR

## 2014-10-09 MED ORDER — HYDROMORPHONE HCL 1 MG/ML IJ SOLN
INTRAMUSCULAR | Status: AC
  Administered 2014-10-09: 0.5 mg via INTRAVENOUS
  Filled 2014-10-09: qty 1

## 2014-10-09 MED ORDER — PREGABALIN 75 MG PO CAPS
75.0000 mg | ORAL_CAPSULE | Freq: Two times a day (BID) | ORAL | Status: DC
  Administered 2014-10-09 – 2014-10-10 (×3): 75 mg via ORAL
  Filled 2014-10-09 (×3): qty 1

## 2014-10-09 MED ORDER — DIAZEPAM 5 MG PO TABS
5.0000 mg | ORAL_TABLET | Freq: Four times a day (QID) | ORAL | Status: DC | PRN
  Administered 2014-10-10: 5 mg via ORAL
  Filled 2014-10-09: qty 1

## 2014-10-09 MED ORDER — METOCLOPRAMIDE HCL 5 MG/ML IJ SOLN: 5.0000 mg | Freq: Three times a day (TID) | INTRAMUSCULAR | Status: DC | PRN

## 2014-10-09 MED ORDER — LACTATED RINGERS IV SOLN
INTRAVENOUS | Status: DC | PRN
  Administered 2014-10-09 (×2): via INTRAVENOUS

## 2014-10-09 SURGICAL SUPPLY — 48 items
BANDAGE ELASTIC 4 VELCRO ST LF (GAUZE/BANDAGES/DRESSINGS) IMPLANT
BANDAGE ELASTIC 6 VELCRO ST LF (GAUZE/BANDAGES/DRESSINGS) IMPLANT
BANDAGE ESMARK 6X9 LF (GAUZE/BANDAGES/DRESSINGS) IMPLANT
BNDG COHESIVE 4X5 TAN STRL (GAUZE/BANDAGES/DRESSINGS) IMPLANT
BNDG ESMARK 6X9 LF (GAUZE/BANDAGES/DRESSINGS)
BNDG GAUZE ELAST 4 BULKY (GAUZE/BANDAGES/DRESSINGS) ×2 IMPLANT
COVER SURGICAL LIGHT HANDLE (MISCELLANEOUS) ×2 IMPLANT
CUFF TOURNIQUET SINGLE 34IN LL (TOURNIQUET CUFF) IMPLANT
CUFF TOURNIQUET SINGLE 44IN (TOURNIQUET CUFF) IMPLANT
DRAPE C-ARM 42X72 X-RAY (DRAPES) IMPLANT
DRAPE INCISE IOBAN 66X45 STRL (DRAPES) ×2 IMPLANT
DRAPE ORTHO SPLIT 77X108 STRL (DRAPES)
DRAPE SURG ORHT 6 SPLT 77X108 (DRAPES) IMPLANT
DRSG EMULSION OIL 3X3 NADH (GAUZE/BANDAGES/DRESSINGS) ×2 IMPLANT
DRSG PAD ABDOMINAL 8X10 ST (GAUZE/BANDAGES/DRESSINGS) ×2 IMPLANT
DRSG VAC ATS SM SENSATRAC (GAUZE/BANDAGES/DRESSINGS) ×2 IMPLANT
ELECT REM PT RETURN 9FT ADLT (ELECTROSURGICAL) ×2
ELECTRODE REM PT RTRN 9FT ADLT (ELECTROSURGICAL) ×1 IMPLANT
GAUZE SPONGE 4X4 12PLY STRL (GAUZE/BANDAGES/DRESSINGS) ×2 IMPLANT
GLOVE BIOGEL PI IND STRL 9 (GLOVE) ×1 IMPLANT
GLOVE BIOGEL PI INDICATOR 9 (GLOVE) ×1
GLOVE SURG ORTHO 9.0 STRL STRW (GLOVE) ×4 IMPLANT
GOWN STRL REUS W/ TWL XL LVL3 (GOWN DISPOSABLE) ×3 IMPLANT
GOWN STRL REUS W/TWL XL LVL3 (GOWN DISPOSABLE) ×3
HANDPIECE INTERPULSE COAX TIP (DISPOSABLE) ×1
KIT BASIN OR (CUSTOM PROCEDURE TRAY) ×2 IMPLANT
KIT ROOM TURNOVER OR (KITS) ×2 IMPLANT
KIT STIMULAN RAPID CURE 5CC (Orthopedic Implant) ×2 IMPLANT
MANIFOLD NEPTUNE II (INSTRUMENTS) ×2 IMPLANT
NS IRRIG 1000ML POUR BTL (IV SOLUTION) ×2 IMPLANT
PACK ORTHO EXTREMITY (CUSTOM PROCEDURE TRAY) ×2 IMPLANT
PAD ARMBOARD 7.5X6 YLW CONV (MISCELLANEOUS) ×4 IMPLANT
PAD CAST 4YDX4 CTTN HI CHSV (CAST SUPPLIES) ×1 IMPLANT
PADDING CAST COTTON 4X4 STRL (CAST SUPPLIES) ×1
SET HNDPC FAN SPRY TIP SCT (DISPOSABLE) ×1 IMPLANT
SPONGE LAP 18X18 X RAY DECT (DISPOSABLE) ×2 IMPLANT
STAPLER VISISTAT 35W (STAPLE) IMPLANT
STOCKINETTE IMPERVIOUS 9X36 MD (GAUZE/BANDAGES/DRESSINGS) IMPLANT
SUCTION FRAZIER TIP 10 FR DISP (SUCTIONS) ×2 IMPLANT
SUT ETHILON 2 0 PSLX (SUTURE) ×4 IMPLANT
SUT VIC AB 0 CT1 27 (SUTURE)
SUT VIC AB 0 CT1 27XBRD ANBCTR (SUTURE) IMPLANT
SUT VIC AB 2-0 CT1 27 (SUTURE)
SUT VIC AB 2-0 CT1 TAPERPNT 27 (SUTURE) IMPLANT
TOWEL OR 17X24 6PK STRL BLUE (TOWEL DISPOSABLE) ×2 IMPLANT
TOWEL OR 17X26 10 PK STRL BLUE (TOWEL DISPOSABLE) ×2 IMPLANT
TUBING SUCTION BULK 100 FT (MISCELLANEOUS) ×2 IMPLANT
WATER STERILE IRR 1000ML POUR (IV SOLUTION) ×2 IMPLANT

## 2014-10-09 NOTE — Op Note (Signed)
10/09/2014  10:02 AM  PATIENT:  Bradley Espinoza    PRE-OPERATIVE DIAGNOSIS:  Infected Deep Hardware right calcaneus plate  POST-OPERATIVE DIAGNOSIS:  Same  PROCEDURE:  Removal Deep Hardware,  Irrigation and Debridement with excision of skin soft tissue and bone Calcaneus,  Place Antibiotic Beads and Wound VAC Local tissue rearrangement with wound closure for wound size 1 x 7 cm   SURGEON:  Newt Minion, MD  PHYSICIAN ASSISTANT:None ANESTHESIA:   General  PREOPERATIVE INDICATIONS:  Bradley Espinoza is a  59 y.o. male with a diagnosis of Infected Deep Hardware who failed conservative measures and elected for surgical management.    The risks benefits and alternatives were discussed with the patient preoperatively including but not limited to the risks of infection, bleeding, nerve injury, cardiopulmonary complications, the need for revision surgery, among others, and the patient was willing to proceed.  OPERATIVE IMPLANTS: Stimulant beads with 500 mg vancomycin and 160 mg gentamicin  OPERATIVE FINDINGS: No purulence  OPERATIVE PROCEDURE: Patient is a 59 year old gentleman who is status post bilateral calcaneal fractures with the right open. Patient presented in follow-up with cellulitis and drainage from the surgical incision and presents at this time for removal of the hardware. Risks and benefits were discussed including loss of reduction of fixation arthritis persistent infection need for additional surgery. Patient states he understands and wished to proceed at this time.  Patient was brought to the operating room and underwent a general anesthetic. After adequate levels and anesthesia were obtained patient's right lower extremity was prepped using DuraPrep draped into a sterile field. His previous extensile incision was used this was carried down to the plate and bone. The screws and plate were removed. The wound was irrigated with pulsatile lavage. The bone graft and bone was  removed there was no purulence. Skin and soft tissue was also removed. After irrigation debridement and cleansing antibiotic beads were used and packed into the bone defect. Beads included 5 mL of stimulant and 500 mg vancomycin and 160 mg gentamicin. Local tissue rearrangement was then performed and the wound was closed using 2-0 nylon using a Algower Donati suture technique. A wound VAC was placed this had a good suction fit patient was extubated taken to the PACU in stable condition.

## 2014-10-09 NOTE — Clinical Social Work Psychosocial (Signed)
Clinical Social Work Department BRIEF PSYCHOSOCIAL ASSESSMENT 10/09/2014  Patient:  Bradley Espinoza,Bradley Espinoza     Account Number:  [id number]     Admit date:  10/09/2014  Clinical Social Worker:  Patrick-jefferson,Crystal, LCSWA  Date/Time:  10/09/2014 07:42 PM  Referred by:  Physician  Date Referred:  10/09/2014 Referred for  Other - See comment   Other Referral:   From [facility]   Interview type:  Patient Other interview type:    PSYCHOSOCIAL DATA Living Status:  FACILITY Admitted from facility:  [facility] Level of care:  Skilled Nursing Facility Primary support name:  Mary Ann Scalf Primary support relationship to patient:  SPOUSE Degree of support available:   Fair    CURRENT CONCERNS Current Concerns  Post-Acute Placement   Other Concerns:    SOCIAL WORK ASSESSMENT / PLAN CSW met with patient who was oriented and alert. CSW introduced self and explained role. CSW discussed d/c plan. Per patient he has been at [facility] since December 16. Patient plans to return to facility.   Assessment/plan status:  Other - See comment Other assessment/ plan:   CSW to submit PASARR and complete FL2 for placement.   Information/referral to community resources:    PATIENT'S/FAMILY'S RESPONSE TO PLAN OF CARE: Patient cooperative and agreeable to return to facility.   Crystal Patrick-Jefferson, LCSWA Weekend Clinical Social Worker [phone]  

## 2014-10-09 NOTE — H&P (Signed)
Bradley Espinoza is an 59 y.o. male.   Chief Complaint: Infected deep hardware right calcaneus fracture open reduction internal fixation HPI: Patient is a 59 year old gentleman who is status post bilateral calcaneal fractures. The right calcaneus fracture was opened. Patient had undergone serial irrigation and debridements and internal fixation on the right and patient presents at this time with cellulitis and infection of the deep retained hardware.  Past Medical History  Diagnosis Date  . Anxiety   . Depression   . Headache(784.0)   . Arthritis   . Fracture     B/L ankles    Past Surgical History  Procedure Laterality Date  . Back surgery  2004  . Cervical spine surgery  2008  . Inguinal hernia repair Bilateral   . I&d extremity Right 09/15/2014    Procedure: IRRIGATION AND DEBRIDEMENT Ankle;  Surgeon: Renette Butters, MD;  Location: [place];  Service: Orthopedics;  Laterality: Right;  . Orif calcaneous fracture Right 09/19/2014    Procedure: OPEN REDUCTION INTERNAL FIXATION (ORIF) CALCANEOUS FRACTURE;  Surgeon: Newt Minion, MD;  Location: [place];  Service: Orthopedics;  Laterality: Right;  . Orif calcaneous fracture Left 09/19/2014    Procedure: OPEN REDUCTION INTERNAL FIXATION (ORIF) CALCANEOUS FRACTURE;  Surgeon: Newt Minion, MD;  Location: [place];  Service: Orthopedics;  Laterality: Left;    Family History  Problem Relation Age of Onset  . Diabetes Father   . Cancer Other    Social History:  reports that he has been smoking Cigarettes.  He started smoking about 38 years ago. He has a 10 pack-year smoking history. He has never used smokeless tobacco. He reports that he does not drink alcohol or use illicit drugs.  Allergies: No Active Allergies  Medications Prior to Admission  Medication Sig Dispense Refill  . aspirin-acetaminophen-caffeine (EXCEDRIN MIGRAINE) 250-250-65 MG per tablet Take 1 tablet by mouth daily as needed for headache.     . diazepam (VALIUM) 5 MG  tablet Take 1 tablet (5 mg total) by mouth every 6 (six) hours as needed. For muscle pain 30 tablet 0  . ibuprofen (ADVIL,MOTRIN) 200 MG tablet Take 200 mg by mouth daily as needed for headache.     . Menthol, Topical Analgesic, (BIOFREEZE) 4 % GEL Apply 1 application topically as needed (Pain).     Marland Kitchen oxyCODONE (OXY IR/ROXICODONE) 5 MG immediate release tablet Take 1-2 tablets (5-10 mg total) by mouth every 3 (three) hours as needed for moderate pain, severe pain or breakthrough pain. 360 tablet 0  . OxyCODONE (OXYCONTIN) 20 mg T12A 12 hr tablet Take one tablet by mouth every 12 hours for pain. Do not crush (Patient taking differently: 30 mg. Take one tablet by mouth every 12 hours for pain. Do not crush) 60 tablet 0  . pregabalin (LYRICA) 75 MG capsule Take 1 capsule (75 mg total) by mouth 2 (two) times daily. 60 capsule 0  . ranitidine (ZANTAC) 150 MG tablet Take 150 mg by mouth 2 (two) times daily.    . traMADol (ULTRAM) 50 MG tablet Take 2 tablets (100 mg total) by mouth every 6 (six) hours. 30 tablet 0    No results found for this or any previous visit (from the past 48 hour(s)). No results found.  Review of Systems  All other systems reviewed and are negative.   There were no vitals taken for this visit. Physical Exam  Cellulitis and infection deep retained hardware right open calcaneus fracture.Assessment/Plan  Assessment: Cellulitis and infection  deep retained hardware right open calcaneus fracture  Plan: Will plan for removal of deep retained hardware irrigation and debridement of the calcaneus. Possible placement of antibiotic beads possible placement of wound VAC.  DUDA,MARCUS V 10/09/2014, 7:00 AM

## 2014-10-09 NOTE — Anesthesia Preprocedure Evaluation (Addendum)
Anesthesia Evaluation  Patient identified by MRN, date of birth, ID band Patient awake    Reviewed: Allergy & Precautions, H&P , NPO status , Patient's Chart, lab work & pertinent test results  Airway Mallampati: II  TM Distance: >3 FB Neck ROM: Full    Dental no notable dental hx. (+) Partial Upper, Dental Advisory Given   Pulmonary Current Smoker, former smoker,  breath sounds clear to auscultation  Pulmonary exam normal       Cardiovascular negative cardio ROS  Rhythm:Regular Rate:Normal     Neuro/Psych  Headaches,    GI/Hepatic negative GI ROS, Neg liver ROS,   Endo/Other  negative endocrine ROS  Renal/GU      Musculoskeletal   Abdominal   Peds  Hematology negative hematology ROS (+)   Anesthesia Other Findings   Reproductive/Obstetrics negative OB ROS                            Anesthesia Physical Anesthesia Plan  ASA: II  Anesthesia Plan: General   Post-op Pain Management:    Induction: Intravenous  Airway Management Planned: LMA and Oral ETT  Additional Equipment:   Intra-op Plan:   Post-operative Plan: Extubation in OR  Informed Consent: I have reviewed the patients History and Physical, chart, labs and discussed the procedure including the risks, benefits and alternatives for the proposed anesthesia with the patient or authorized representative who has indicated his/her understanding and acceptance.   Dental advisory given  Plan Discussed with: CRNA and Surgeon  Anesthesia Plan Comments:         Anesthesia Quick Evaluation

## 2014-10-09 NOTE — Anesthesia Postprocedure Evaluation (Signed)
  Anesthesia Post-op Note  Patient: Bradley Espinoza  Procedure(s) Performed: Procedure(s): Removal Deep Hardware, Irrigation and Debridement Calcaneus, Place Antibiotic Beads and Wound VAC  (Right)  Patient Location: PACU  Anesthesia Type:General  Level of Consciousness: awake and alert   Airway and Oxygen Therapy: Patient Spontanous Breathing  Post-op Pain: mild  Post-op Assessment: Post-op Vital signs reviewed  Post-op Vital Signs: stable  Last Vitals:  Filed Vitals:   10/09/14 1105  BP:   Pulse:   Temp: 36.8 C  Resp:     Complications: No apparent anesthesia complications

## 2014-10-09 NOTE — Progress Notes (Signed)
Patient has wheelchair, clothing, back brace, brown bag, flip phone, charger, check book, wallet, $3, paperwork. All of this was labeled and taken to PACU. Offered to lock money, phone, wallet up, patient refused. Notified him that [facility] was not responsible. Patient verbalized understanding of same.

## 2014-10-09 NOTE — Progress Notes (Signed)
   10/09/14 0740  OBSTRUCTIVE SLEEP APNEA  Have you ever been diagnosed with sleep apnea through a sleep study? No  Do you snore loudly (loud enough to be heard through closed doors)?  0  Do you often feel tired, fatigued, or sleepy during the daytime? 1 (reports d/t pain medication)  Has anyone observed you stop breathing during your sleep? 0  Do you have, or are you being treated for high blood pressure? 0  BMI more than 35 kg/m2? 0  Age over 14 years old? 1  Neck circumference greater than 40 cm/16 inches? 1  Gender: 1  Obstructive Sleep Apnea Score 4  Score 4 or greater  Results sent to PCP

## 2014-10-09 NOTE — Anesthesia Procedure Notes (Signed)
Procedure Name: LMA Insertion Date/Time: 10/09/2014 9:25 AM Performed by: Marinda Elk A Pre-anesthesia Checklist: Patient identified, Timeout performed, Emergency Drugs available, Suction available and Patient being monitored Patient Re-evaluated:Patient Re-evaluated prior to inductionOxygen Delivery Method: Circle system utilized Preoxygenation: Pre-oxygenation with 100% oxygen Intubation Type: IV induction Ventilation: Mask ventilation without difficulty LMA: LMA inserted LMA Size: 4.0 Number of attempts: 1 Placement Confirmation: positive ETCO2 and breath sounds checked- equal and bilateral Tube secured with: Tape Dental Injury: Teeth and Oropharynx as per pre-operative assessment

## 2014-10-09 NOTE — Transfer of Care (Signed)
Immediate Anesthesia Transfer of Care Note  Patient: Bradley Espinoza  Procedure(s) Performed: Procedure(s): Removal Deep Hardware, Irrigation and Debridement Calcaneus, Place Antibiotic Beads and Wound VAC  (Right)  Patient Location: PACU  Anesthesia Type:General  Level of Consciousness: awake  Airway & Oxygen Therapy: Patient Spontanous Breathing and Patient connected to nasal cannula oxygen  Post-op Assessment: Report given to PACU RN and Post -op Vital signs reviewed and stable  Post vital signs: Reviewed and stable  Complications: No apparent anesthesia complications

## 2014-10-09 NOTE — Evaluation (Signed)
Physical Therapy Evaluation Patient Details Name: Bradley Espinoza MRN: [id number] DOB: [birth year] Today's Date: 10/09/2014   History of Present Illness  Adm 10/09/14 for hardware removal from Rt foot due to infection. Pt with recent (09/2014) bil calcaneal fractures and L2 fracture and underwent ORIFs.   Clinical Impression  Patient is s/p above surgery resulting in functional limitations due to the deficits listed below (see PT Problem List). Pt plans to return to [facility] due to his continued bil LE NWB status and inaccessible home.  Patient will benefit from skilled PT to increase their independence and safety with mobility to allow discharge to the venue listed below.       Follow Up Recommendations SNF;Supervision - Intermittent    Equipment Recommendations  None recommended by PT    Recommendations for Other Services       Precautions / Restrictions Precautions Precautions: Back Precaution Booklet Issued: No Required Braces or Orthoses: Spinal Brace Spinal Brace: Thoracolumbosacral orthotic (pt reports he long sits and dons brace) Restrictions Weight Bearing Restrictions: Yes RLE Weight Bearing: Non weight bearing LLE Weight Bearing: Non weight bearing      Mobility  Bed Mobility Overal bed mobility: Modified Independent             General bed mobility comments: uses bed rails to maneuver  Transfers                 General transfer comment: unable to assess due to pt's w/c has not been delivered from PACU; recliner does not have drop-arm: RN educated on pt's technique  Ambulation/Gait                Hotel manager mobility:  (not tested; modified independent PTA)  Modified Rankin (Stroke Patients Only)       Balance                                             Pertinent Vitals/Pain Pain Assessment: Faces Faces Pain Scale: Hurts little  more Pain Location: Rt heel Pain Intervention(s): Repositioned;Premedicated before session;Ice applied (ice reapplied and heel floated)    Home Living Family/patient expects to be discharged to:: Skilled nursing facility                 Additional Comments: d/c'd to [facility] due to inaccessible home (for w/c) and bil LE NWB status.    Prior Function Level of Independence: Needs assistance   Gait / Transfers Assistance Needed: laterally scoots/transfers into his w/c and propels modified independent           Hand Dominance   Dominant Hand: Right    Extremity/Trunk Assessment   Upper Extremity Assessment: Overall WFL for tasks assessed           Lower Extremity Assessment:  (able to lift each leg against gravitiy)      Cervical / Trunk Assessment: Other exceptions  Communication   Communication: No difficulties  Cognition Arousal/Alertness: Awake/alert Behavior During Therapy: WFL for tasks assessed/performed Overall Cognitive Status: Within Functional Limits for tasks assessed                      General Comments      Exercises Other Exercises Other Exercises: Provided blue (heavy) theraband for bil  UE resisted exercises for pt to continue while hospitalized      Assessment/Plan    PT Assessment All further PT needs can be met in the next venue of care;Patient needs continued PT services  PT Diagnosis Acute pain   PT Problem List Decreased activity tolerance;Decreased mobility  PT Treatment Interventions DME instruction;Functional mobility training;Therapeutic activities;Therapeutic exercise;Patient/family education;Wheelchair mobility training   PT Goals (Current goals can be found in the Care Plan section) Acute Rehab PT Goals Patient Stated Goal: get up to wheelchair  PT Goal Formulation: With patient Time For Goal Achievement: 10/11/14 Potential to Achieve Goals: Good    Frequency Min 2X/week   Barriers to discharge Inaccessible  home environment pt holding his bed at [facility] center    Co-evaluation               End of Session   Activity Tolerance: Patient tolerated treatment well Patient left: in bed;with call bell/phone within reach Nurse Communication: Mobility status;Other (comment) (RN to call re: his w/c in PACU)    Functional Assessment Tool Used: clinical judgement Functional Limitation: Changing and maintaining body position Changing and Maintaining Body Position Current Status [phone]): At least 60 percent but less than 80 percent impaired, limited or restricted Changing and Maintaining Body Position Goal Status (D7334): At least 60 percent but less than 80 percent impaired, limited or restricted    Time: 1634-1650 PT Time Calculation (min) (ACUTE ONLY): 16 min   Charges:   PT Evaluation $Initial PT Evaluation Tier I: 1 Procedure PT Treatments $Therapeutic Exercise: 8-22 mins   PT G Codes:   PT G-Codes **NOT FOR INPATIENT CLASS** Functional Assessment Tool Used: clinical judgement Functional Limitation: Changing and maintaining body position Changing and Maintaining Body Position Current Status (K8301): At least 60 percent but less than 80 percent impaired, limited or restricted Changing and Maintaining Body Position Goal Status (F9968): At least 60 percent but less than 80 percent impaired, limited or restricted    Cheyeanne Roadcap 10/09/2014, 5:10 PM Pager [phone]

## 2014-10-10 ENCOUNTER — Inpatient Hospital Stay
Admission: RE | Admit: 2014-10-10 | Discharge: 2014-10-25 | Disposition: A | Discharge status: Home / Self Care | Payer: 59 | Source: Ambulatory Visit | Attending: Internal Medicine | Admitting: Internal Medicine

## 2014-10-10 DIAGNOSIS — S32001A Stable burst fracture of unspecified lumbar vertebra, initial encounter for closed fracture: Principal | ICD-10-CM

## 2014-10-10 DIAGNOSIS — T847XXA Infection and inflammatory reaction due to other internal orthopedic prosthetic devices, implants and grafts, initial encounter: Secondary | ICD-10-CM | POA: Diagnosis not present

## 2014-10-10 NOTE — Progress Notes (Signed)
Report called to Sharyn Lull, Therapist, sports at [facility] at 1250. Patient being transported by private vehicle (sister's).

## 2014-10-10 NOTE — Clinical Social Work Note (Signed)
CSW made aware patient ready for d/c to [facility]. CSW contacted facility and spoke with patient's RN Chilton Greathouse. Per Sharyn Lull, d/c summary is to be faxed to [phone]. CSW faxed d/c summary and prepared d/c packet. CSW met with patient who states he is a little sleepy. Patient agreeable to d/c plan. Per patient, his sister is to transport him to facility. CSW provided RN with number for report [phone] [facility]) and room number:104 window. No further needs. CSW signing off.  [place], [place] Weekend Clinical Social Worker [phone]

## 2014-10-10 NOTE — Discharge Summary (Signed)
Physician Discharge Summary  Patient ID: Bradley Espinoza MRN: [id number] DOB/AGE: [birth year] 59 y.o.  Admit date: 10/09/2014 Discharge date: 10/10/2014  Admission Diagnoses: Infected right calcaneus fracture status post open reduction internal fixation  Discharge Diagnoses:  Active Problems:   Acute osteomyelitis of calcaneum   Discharged Condition: stable  Hospital Course: Patient's hospital course was essentially unremarkable. He underwent removal of the deep retained hardware. Irrigation and debridement of skin soft tissue and bone. Placement of antibiotic beads and placement of a wound VAC. Patient progressed well and was discharged back to skilled nursing. Patient states that he felt great after the debridement.  Consults: None  Significant Diagnostic Studies: labs: Routine labs  Treatments: surgery: See operative note  Discharge Exam: Blood pressure 120/63, pulse 63, temperature 97.7 F (36.5 C), temperature source Oral, resp. rate 16, height 6\' 1"  (1.854 m), weight 81.647 kg (180 lb), SpO2 100 %. Incision/Wound: skin wrinkling well incision clean and dry  Disposition: 04-Intermediate Care Facility  Discharge Instructions    Call MD / Call 911    Complete by:  As directed   If you experience chest pain or shortness of breath, CALL 911 and be transported to the hospital emergency room.  If you develope a fever above 101 F, pus (white drainage) or increased drainage or redness at the wound, or calf pain, call your surgeon's office.     Constipation Prevention    Complete by:  As directed   Drink plenty of fluids.  Prune juice may be helpful.  You may use a stool softener, such as Colace (over the counter) 100 mg twice a day.  Use MiraLax (over the counter) for constipation as needed.     Diet - low sodium heart healthy    Complete by:  As directed      Increase activity slowly as tolerated    Complete by:  As directed             Medication List    TAKE these  medications        BIOFREEZE 4 % Gel  Generic drug:  Menthol (Topical Analgesic)  Apply 1 application topically as needed (Pain).     diazepam 5 MG tablet  Commonly known as:  VALIUM  Take 1 tablet (5 mg total) by mouth every 6 (six) hours as needed. For muscle pain     docusate sodium 100 MG capsule  Commonly known as:  COLACE  Take 100 mg by mouth daily as needed for mild constipation.     doxycycline 100 MG capsule  Commonly known as:  VIBRAMYCIN  Take 100 mg by mouth 2 (two) times daily.     ibuprofen 200 MG tablet  Commonly known as:  ADVIL,MOTRIN  Take 200 mg by mouth daily as needed for headache.     LACTOBACILLUS PO  Take 1 capsule by mouth 2 (two) times daily.     OXYCONTIN 30 MG T12a  Generic drug:  OxyCODONE HCl ER  Take 30 mg by mouth 2 (two) times daily.     oxyCODONE 5 MG immediate release tablet  Commonly known as:  Oxy IR/ROXICODONE  Take 1-2 tablets (5-10 mg total) by mouth every 3 (three) hours as needed for moderate pain, severe pain or breakthrough pain.     polyethylene glycol packet  Commonly known as:  MIRALAX / GLYCOLAX  Take 17 g by mouth daily as needed (constipation).     pregabalin 75 MG capsule  Commonly known as:  LYRICA  Take 1 capsule (75 mg total) by mouth 2 (two) times daily.     ranitidine 150 MG tablet  Commonly known as:  ZANTAC  Take 150 mg by mouth 2 (two) times daily.           Follow-up Information    Follow up with DUDA,MARCUS V, MD In 1 week.   Specialty:  Orthopedic Surgery   Contact information:   [place] [address] [phone]       Signed: Newt Minion 10/10/2014, 60:16 AM

## 2014-10-10 NOTE — Progress Notes (Signed)
Case Worker: Dr. Sharol Given would like paperwork and FL-2 completed today, if possible, so pt can return to [facility], per pt's request. Dr. Sharol Given is in surgery this morning, so he will check later to see if there is paperwork to sign. Otherwise, pt to be d'c'd tomorrow 10/11/14.

## 2014-10-10 NOTE — Progress Notes (Signed)
UR completed 

## 2014-10-11 ENCOUNTER — Non-Acute Institutional Stay (SKILLED_NURSING_FACILITY): Payer: 59 | Admitting: Internal Medicine

## 2014-10-11 ENCOUNTER — Encounter (HOSPITAL_COMMUNITY): Payer: Self-pay | Admitting: Orthopedic Surgery

## 2014-10-11 DIAGNOSIS — S92001D Unspecified fracture of right calcaneus, subsequent encounter for fracture with routine healing: Secondary | ICD-10-CM

## 2014-10-11 DIAGNOSIS — L03115 Cellulitis of right lower limb: Secondary | ICD-10-CM

## 2014-10-14 NOTE — Progress Notes (Addendum)
Patient ID: Bradley Espinoza, male   DOB: [birth year], 59 y.o.   MRN: [id number]               PROGRESS NOTE  DATE:  10/11/2014                  FACILITY: [facility]    LEVEL OF CARE:   SNF   Acute Visit   CHIEF COMPLAINT:  Review after a short stay at [facility] for hardware removal and debridement by Dr. Sharol Given.    HISTORY OF PRESENT ILLNESS:  This is a 59 year-old man who initially came to [place] in mid December.  I admitted him to the building on 09/26/2014.  He had fallen off a ladder about 50 feet, landing on his feet.  He had an open right calcaneal fracture as well as a closed left calcaneal fracture, and an L2 fracture.  The L2 fracture was managed nonoperatively.  His original surgery was by Dr. Percell Miller for an open right ankle fracture.  He went back to the OR on 09/19/2014 for bilateral calcaneal fractures, underwent an open reduction and internal fixation of the calcaneus fractures bilaterally.    Apparently in the facility, he developed some erythema around his surgical wound on the right.  He was started on doxycycline 100 b.i.d. for seven days.  Apparently went to see Dr. Sharol Given on 10/09/2014.  He underwent removal of the hardware, irrigation and debridement of the skin, soft tissue and bone, placement of antibiotic beads and placement of a wound VAC.  The wound VAC was removed before he came here.  I do not see any intraoperative cultures.  He did not come back here with any further antibiotic orders.     PHYSICAL EXAMINATION:   VASCULAR:   ARTERIAL:  Right foot:  His peripheral pulses are palpable.   MUSCULOSKELETAL:  EXTREMITIES:   RIGHT LOWER EXTREMITY:  He appears to be able to move all his toes.  The surgical incisions look stable, although I think his second incision has a 1-in linear opening.  There is no surrounding erythema, although there is some edema.  No significant tenderness is noted.    ASSESSMENT/PLAN:   There is no current evidence of infection.   There is nothing I can culture here.  I do not see that he had intraoperative cultures and, therefore, I do not know that systemic antibiotics are particularly necessary.  He apparently follows with Dr. Sharol Given this Friday.  I will leave this to him to review.  His hardware has been removed.  I am not sure what the plan is for definitive treatment of this fracture, if any is necessary.

## 2014-10-20 ENCOUNTER — Other Ambulatory Visit: Payer: Self-pay | Admitting: *Deleted

## 2014-10-20 MED ORDER — OXYCODONE HCL ER 30 MG PO T12A: EXTENDED_RELEASE_TABLET | ORAL | Status: DC

## 2014-10-20 NOTE — Telephone Encounter (Signed)
[facility] 

## 2014-10-25 ENCOUNTER — Non-Acute Institutional Stay (SKILLED_NURSING_FACILITY): Payer: 59 | Admitting: Internal Medicine

## 2014-10-25 DIAGNOSIS — S92002D Unspecified fracture of left calcaneus, subsequent encounter for fracture with routine healing: Secondary | ICD-10-CM

## 2014-10-25 DIAGNOSIS — S92001D Unspecified fracture of right calcaneus, subsequent encounter for fracture with routine healing: Secondary | ICD-10-CM

## 2014-10-25 DIAGNOSIS — S32029D Unspecified fracture of second lumbar vertebra, subsequent encounter for fracture with routine healing: Secondary | ICD-10-CM

## 2014-10-26 NOTE — Progress Notes (Addendum)
Patient ID: Bradley Espinoza, male   DOB: 01-19-1956, 59 y.o.   MRN: [id number]               PROGRESS NOTE  DATE:  10/25/2014                 FACILITY: [facility]                      LEVEL OF CARE:   SNF   Acute Visit/Discharge Visit                    CHIEF COMPLAINT:  Pre-discharge review.                    HISTORY OF PRESENT ILLNESS:  This is a 59 year-old man who fell while trying to cut a limb on a tree and ended up falling off a ladder about 50 feet.  He suffered bilateral calcaneal fractures requiring surgery.  He also had an L2 fracture.  He underwent surgery by Dr. Sharol Given for the bilateral calcaneal fractures.    He had to be readmitted for an infection in the right ankle hardware.  This was removed.  He did not come back on antibiotics and there were no interoperative cultures done.    From the back point of view, he has been to see Dr. Joya Salm.  He wears a brace.    The patient is ready to go home.  He walks with a walker, weightbearing only on the left.  He is able to transfer himself.  He is insistent on going home, although it does not sound as though he has much home support.  He will need PT and OT.    PHYSICAL EXAMINATION:   SKIN:  INSPECTION:  Right ankle:  The incision actually looks clean.  There is some swelling, mild erythema, but nothing looks ominous here.   NEUROLOGICAL:    BALANCE/GAIT:  He is able to bring himself to a standing position, weightbear on the left using his walker.    ASSESSMENT/PLAN:    I have written prescriptions for OxyContin 30 mg b.i.d. and oxycodone 10 mg q.6 hours p.r.n.  He also is on Valium, which I will write scripts for.      This is perhaps slightly rushed in my opinion however the patient insists on leaving and there is not an overwhelming reason to attempt to hold him here.

## 2014-12-10 ENCOUNTER — Other Ambulatory Visit (HOSPITAL_COMMUNITY): Payer: Self-pay | Admitting: Neurosurgery

## 2014-12-10 DIAGNOSIS — S32001A Stable burst fracture of unspecified lumbar vertebra, initial encounter for closed fracture: Secondary | ICD-10-CM

## 2014-12-14 ENCOUNTER — Ambulatory Visit (HOSPITAL_COMMUNITY)
Admission: RE | Admit: 2014-12-14 | Discharge: 2014-12-14 | Disposition: A | Discharge status: Home / Self Care | Payer: 59 | Source: Ambulatory Visit | Attending: Neurosurgery | Admitting: Neurosurgery

## 2014-12-14 DIAGNOSIS — S32001A Stable burst fracture of unspecified lumbar vertebra, initial encounter for closed fracture: Secondary | ICD-10-CM | POA: Insufficient documentation

## 2014-12-14 DIAGNOSIS — X58XXXA Exposure to other specified factors, initial encounter: Secondary | ICD-10-CM | POA: Diagnosis not present

## 2014-12-14 DIAGNOSIS — S32021A Stable burst fracture of second lumbar vertebra, initial encounter for closed fracture: Secondary | ICD-10-CM | POA: Diagnosis present

## 2014-12-28 ENCOUNTER — Other Ambulatory Visit: Payer: Self-pay | Admitting: Neurosurgery

## 2014-12-29 ENCOUNTER — Other Ambulatory Visit: Payer: Self-pay | Admitting: Neurosurgery

## 2015-01-05 ENCOUNTER — Encounter (HOSPITAL_COMMUNITY)
Admission: RE | Admit: 2015-01-05 | Discharge: 2015-01-05 | Disposition: A | Discharge status: Home / Self Care | Payer: 59 | Source: Ambulatory Visit | Attending: Neurosurgery | Admitting: Neurosurgery

## 2015-01-05 ENCOUNTER — Encounter (HOSPITAL_COMMUNITY): Payer: Self-pay

## 2015-01-05 DIAGNOSIS — Z01812 Encounter for preprocedural laboratory examination: Secondary | ICD-10-CM | POA: Insufficient documentation

## 2015-01-05 HISTORY — DX: Gastro-esophageal reflux disease without esophagitis: K21.9

## 2015-01-05 LAB — CBC
HCT: 45.4 % (ref 39.0–52.0)
Hemoglobin: 14.6 g/dL (ref 13.0–17.0)
MCH: 28.5 pg (ref 26.0–34.0)
MCHC: 32.2 g/dL (ref 30.0–36.0)
MCV: 88.5 fL (ref 78.0–100.0)
PLATELETS: 195 10*3/uL (ref 150–400)
RBC: 5.13 MIL/uL (ref 4.22–5.81)
RDW: 14.6 % (ref 11.5–15.5)
WBC: 6 10*3/uL (ref 4.0–10.5)

## 2015-01-05 LAB — BASIC METABOLIC PANEL
Anion gap: 8 (ref 5–15)
BUN: 16 mg/dL (ref 6–23)
CO2: 26 mmol/L (ref 19–32)
Calcium: 9.6 mg/dL (ref 8.4–10.5)
Chloride: 104 mmol/L (ref 96–112)
Creatinine, Ser: 0.83 mg/dL (ref 0.50–1.35)
GFR calc Af Amer: >90 mL/min (ref >90)
GFR calc non Af Amer: >90 mL/min (ref >90)
GLUCOSE: 92 mg/dL (ref 70–99)
Potassium: 4.4 mmol/L (ref 3.5–5.1)
Sodium: 138 mmol/L (ref 135–145)

## 2015-01-05 LAB — TYPE AND SCREEN
ABO/RH(D): A POS
ANTIBODY SCREEN: NEGATIVE

## 2015-01-05 LAB — SURGICAL PCR SCREEN
MRSA, PCR: POSITIVE — AB
STAPHYLOCOCCUS AUREUS: POSITIVE — AB

## 2015-01-05 NOTE — Progress Notes (Signed)
Pt called back and was given positive results. Questions answered for pt.

## 2015-01-05 NOTE — Progress Notes (Signed)
Mupirocin Ointment called into [facility] in [place] for positive PCR of MRSA and Staph. Left message on pt's voicemail notifying him of positive results.

## 2015-01-05 NOTE — Pre-Procedure Instructions (Signed)
Bradley Espinoza  01/05/2015   Your procedure is scheduled on:  01/12/2015  Report to [facility] Admitting at 6:30 AM.  Call this number if you have problems the morning of surgery: [phone]   Remember:   Do not eat food or drink liquids after midnight. On TUESDAY   Take these medicines the morning of surgery with A SIP OF WATER: you may take your pain medicine the morning of surgery if you want it, in addition you may take Valium, Ranitidine &               allergy medicine if NEEDED   Do not wear jewelry   Do not wear lotions, powders, or perfumes. You may wear deodorant.             . Men may shave face and neck.   Do not bring valuables to the hospital.  [facility] is not responsible                  for any belongings or valuables.               Contacts, dentures or bridgework may not be worn into surgery.   Leave suitcase in the car. After surgery it may be brought to your room.   For patients admitted to the hospital, discharge time is determined by your                treatment team.               Patients discharged the day of surgery will not be allowed to drive  home.  Name and phone number of your driver: pt. Will arrive alone Day of Surgery, family will come later in the day   Special Instructions: Special Instructions: [facility] - Preparing for Surgery  Before surgery, you can play an important role.  Because skin is not sterile, your skin needs to be as free of germs as possible.  You can reduce the number of germs on you skin by washing with CHG (chlorahexidine gluconate) soap before surgery.  CHG is an antiseptic cleaner which kills germs and bonds with the skin to continue killing germs even after washing.  Please DO NOT use if you have an allergy to CHG or antibacterial soaps.  If your skin becomes reddened/irritated stop using the CHG and inform your nurse when you arrive at Short Stay.  Do not shave (including legs and underarms) for at  least 48 hours prior to the first CHG shower.  You may shave your face.  Please follow these instructions carefully:   1.  Shower with CHG Soap the night before surgery and the  morning of Surgery.  2.  If you choose to wash your hair, wash your hair first as usual with your  normal shampoo.  3.  After you shampoo, rinse your hair and body thoroughly to remove the  Shampoo.  4.  Use CHG as you would any other liquid soap.  You can apply chg directly to the skin and wash gently with scrungie or a clean washcloth.  5.  Apply the CHG Soap to your body ONLY FROM THE NECK DOWN.    Do not use on open wounds or open sores.  Avoid contact with your eyes, ears, mouth and genitals (private parts).  Wash genitals (private parts)   with your normal soap.  6.  Wash thoroughly, paying special attention to the area where your surgery  will be performed.  7.  Thoroughly rinse your body with warm water from the neck down.  8.  DO NOT shower/wash with your normal soap after using and rinsing off   the CHG Soap.  9.  Pat yourself dry with a clean towel.            10.  Wear clean pajamas.            11.  Place clean sheets on your bed the night of your first shower and do not sleep with pets.  Day of Surgery  Do not apply any lotions/deodorants the morning of surgery.  Please wear clean clothes to the hospital/surgery center.   Please read over the following fact sheets that you were given: Pain Booklet, Coughing and Deep Breathing, Blood Transfusion Information, MRSA Information and Surgical Site Infection Prevention

## 2015-01-11 MED ORDER — CEFAZOLIN SODIUM-DEXTROSE 2-3 GM-% IV SOLR
2.0000 g | INTRAVENOUS | Status: AC
  Administered 2015-01-12: 2 g via INTRAVENOUS
  Filled 2015-01-11: qty 50

## 2015-01-11 NOTE — H&P (Signed)
Bradley Espinoza is an 59 y.o. male.   Chief Complaint: increase lumbar pain. HPI: patient who under went l-5, 5-1 lumbar fusion in the past. Did well till he feel fracturing his foot as well as l2. He has been with a brace 24/7 but the pain is not better. Neither foot or spine is healing. He was given several options but now he wants to go ahead with surgery  Past Medical History  Diagnosis Date  . Anxiety   . Fracture     B/L ankles  . Headache(784.0)     allergy related   . Depression   . GERD (gastroesophageal reflux disease)   . Arthritis     low back pain, lumbar radiculopathy    Past Surgical History  Procedure Laterality Date  . Cervical spine surgery  2008  . Inguinal hernia repair Bilateral   . I&d extremity Right 09/15/2014    Procedure: IRRIGATION AND DEBRIDEMENT Ankle;  Surgeon: Renette Butters, MD;  Location: [facility];  Service: Orthopedics;  Laterality: Right;  . Orif calcaneous fracture Right 09/19/2014    Procedure: OPEN REDUCTION INTERNAL FIXATION (ORIF) CALCANEOUS FRACTURE;  Surgeon: Newt Minion, MD;  Location: [facility];  Service: Orthopedics;  Laterality: Right;  . Orif calcaneous fracture Left 09/19/2014    Procedure: OPEN REDUCTION INTERNAL FIXATION (ORIF) CALCANEOUS FRACTURE;  Surgeon: Newt Minion, MD;  Location: [facility];  Service: Orthopedics;  Laterality: Left;  . Back surgery  2004    x 2  . Hardware removal Right 10/09/2014    Procedure: Removal Deep Hardware, Irrigation and Debridement Calcaneus, Place Antibiotic Beads and Wound VAC ;  Surgeon: Newt Minion, MD;  Location: [facility];  Service: Orthopedics;  Laterality: Right;    Family History  Problem Relation Age of Onset  . Diabetes Father   . Cancer Other    Social History:  reports that he quit smoking about 4 months ago. His smoking use included Cigarettes and Cigars. He started smoking about 38 years ago. He has a 10 pack-year smoking history. He has never used smokeless tobacco. He reports that he  does not drink alcohol or use illicit drugs.  Allergies: No Known Allergies  No prescriptions prior to admission    No results found for this or any previous visit (from the past 48 hour(s)). No results found.  Review of Systems  Constitutional: Negative.   HENT: Negative.   Eyes: Negative.   Respiratory: Negative.   Cardiovascular: Negative.   Gastrointestinal: Negative.   Genitourinary: Negative.   Musculoskeletal: Positive for back pain.  Skin: Negative.   Neurological: Negative.   Endo/Heme/Allergies: Negative.   Psychiatric/Behavioral: Negative.     There were no vitals taken for this visit. Physical Exam hent, nl. Neck, nl. Cv, nl. Lugs, clear abdomen soft. Extremities cast in the foot. NEUSR STABLE NO EVIDENCE OF NEUROLOGICAL compromise except tenderness in the upper lumbar area.   Assessment/Plan Fusion fro l1 to l3. He is aware of risks and benefits  Mariana Goytia M 01/11/2015, 5:41 PM

## 2015-01-12 ENCOUNTER — Inpatient Hospital Stay (HOSPITAL_COMMUNITY): Payer: 59

## 2015-01-12 ENCOUNTER — Encounter (HOSPITAL_COMMUNITY)
Admission: RE | Disposition: A | Discharge status: Home / Self Care | Payer: Self-pay | Source: Ambulatory Visit | Attending: Neurosurgery

## 2015-01-12 ENCOUNTER — Inpatient Hospital Stay (HOSPITAL_COMMUNITY): Payer: 59 | Admitting: Certified Registered Nurse Anesthetist

## 2015-01-12 ENCOUNTER — Inpatient Hospital Stay (HOSPITAL_COMMUNITY)
Admission: RE | Admit: 2015-01-12 | Discharge: 2015-01-18 | DRG: 460 | Disposition: A | Discharge status: Home / Self Care | Payer: 59 | Source: Ambulatory Visit | Attending: Neurosurgery | Admitting: Neurosurgery

## 2015-01-12 ENCOUNTER — Encounter (HOSPITAL_COMMUNITY): Payer: Self-pay | Admitting: *Deleted

## 2015-01-12 DIAGNOSIS — F329 Major depressive disorder, single episode, unspecified: Secondary | ICD-10-CM | POA: Diagnosis present

## 2015-01-12 DIAGNOSIS — Z87891 Personal history of nicotine dependence: Secondary | ICD-10-CM | POA: Diagnosis not present

## 2015-01-12 DIAGNOSIS — Z419 Encounter for procedure for purposes other than remedying health state, unspecified: Secondary | ICD-10-CM

## 2015-01-12 DIAGNOSIS — Z981 Arthrodesis status: Secondary | ICD-10-CM | POA: Diagnosis not present

## 2015-01-12 DIAGNOSIS — Z9181 History of falling: Secondary | ICD-10-CM

## 2015-01-12 DIAGNOSIS — K219 Gastro-esophageal reflux disease without esophagitis: Secondary | ICD-10-CM | POA: Diagnosis present

## 2015-01-12 DIAGNOSIS — M545 Low back pain: Secondary | ICD-10-CM | POA: Diagnosis present

## 2015-01-12 DIAGNOSIS — F419 Anxiety disorder, unspecified: Secondary | ICD-10-CM | POA: Diagnosis present

## 2015-01-12 DIAGNOSIS — S32029A Unspecified fracture of second lumbar vertebra, initial encounter for closed fracture: Principal | ICD-10-CM | POA: Diagnosis present

## 2015-01-12 SURGERY — POSTERIOR LUMBAR FUSION 2 LEVEL
Anesthesia: General | Site: Back

## 2015-01-12 MED ORDER — BACITRACIN ZINC 500 UNIT/GM EX OINT
TOPICAL_OINTMENT | CUTANEOUS | Status: DC | PRN
  Administered 2015-01-12: 1 via TOPICAL

## 2015-01-12 MED ORDER — ACETAMINOPHEN 325 MG PO TABS: 650.0000 mg | ORAL_TABLET | ORAL | Status: DC | PRN

## 2015-01-12 MED ORDER — SODIUM CHLORIDE 0.9 % IJ SOLN: 3.0000 mL | INTRAMUSCULAR | Status: DC | PRN

## 2015-01-12 MED ORDER — OXYCODONE-ACETAMINOPHEN 5-325 MG PO TABS
1.0000 | ORAL_TABLET | ORAL | Status: DC | PRN
  Administered 2015-01-12 – 2015-01-13 (×5): 2 via ORAL
  Filled 2015-01-12 (×4): qty 2

## 2015-01-12 MED ORDER — ROCURONIUM BROMIDE 100 MG/10ML IV SOLN
INTRAVENOUS | Status: DC | PRN
  Administered 2015-01-12: 10 mg via INTRAVENOUS
  Administered 2015-01-12: 50 mg via INTRAVENOUS

## 2015-01-12 MED ORDER — HYDROMORPHONE HCL 1 MG/ML IJ SOLN
INTRAMUSCULAR | Status: AC
  Filled 2015-01-12: qty 1

## 2015-01-12 MED ORDER — PROPOFOL 10 MG/ML IV BOLUS
INTRAVENOUS | Status: DC | PRN
  Administered 2015-01-12: 180 mg via INTRAVENOUS

## 2015-01-12 MED ORDER — PREGABALIN 75 MG PO CAPS
75.0000 mg | ORAL_CAPSULE | Freq: Two times a day (BID) | ORAL | Status: DC
  Administered 2015-01-12: 75 mg via ORAL
  Filled 2015-01-12: qty 1

## 2015-01-12 MED ORDER — MIDAZOLAM HCL 5 MG/5ML IJ SOLN
INTRAMUSCULAR | Status: DC | PRN
  Administered 2015-01-12: 2 mg via INTRAVENOUS

## 2015-01-12 MED ORDER — MORPHINE SULFATE (PF) 1 MG/ML IV SOLN
INTRAVENOUS | Status: AC
  Administered 2015-01-12: 15:00:00
  Filled 2015-01-12: qty 25

## 2015-01-12 MED ORDER — SODIUM CHLORIDE 0.9 % IJ SOLN
3.0000 mL | Freq: Two times a day (BID) | INTRAMUSCULAR | Status: DC
  Administered 2015-01-12 – 2015-01-18 (×12): 3 mL via INTRAVENOUS

## 2015-01-12 MED ORDER — DIAZEPAM 5 MG PO TABS
5.0000 mg | ORAL_TABLET | Freq: Four times a day (QID) | ORAL | Status: DC | PRN
  Administered 2015-01-12 – 2015-01-17 (×12): 5 mg via ORAL
  Filled 2015-01-12 (×12): qty 1

## 2015-01-12 MED ORDER — AZELASTINE HCL 0.1 % NA SOLN
1.0000 | Freq: Every day | NASAL | Status: DC | PRN
Start: 2015-01-12 — End: 2015-01-18

## 2015-01-12 MED ORDER — ONDANSETRON HCL 4 MG/2ML IJ SOLN
INTRAMUSCULAR | Status: DC | PRN
  Administered 2015-01-12: 4 mg via INTRAVENOUS

## 2015-01-12 MED ORDER — PROPOFOL 10 MG/ML IV BOLUS
INTRAVENOUS | Status: AC
  Filled 2015-01-12: qty 20

## 2015-01-12 MED ORDER — SODIUM CHLORIDE 0.9 % IJ SOLN
9.0000 mL | INTRAMUSCULAR | Status: DC | PRN
Start: 2015-01-12 — End: 2015-01-13

## 2015-01-12 MED ORDER — PROMETHAZINE HCL 25 MG/ML IJ SOLN: 6.2500 mg | INTRAMUSCULAR | Status: DC | PRN

## 2015-01-12 MED ORDER — NALOXONE HCL 0.4 MG/ML IJ SOLN: 0.4000 mg | INTRAMUSCULAR | Status: DC | PRN

## 2015-01-12 MED ORDER — ONDANSETRON HCL 4 MG/2ML IJ SOLN
INTRAMUSCULAR | Status: AC
  Filled 2015-01-12: qty 2

## 2015-01-12 MED ORDER — EPHEDRINE SULFATE 50 MG/ML IJ SOLN
INTRAMUSCULAR | Status: AC
  Filled 2015-01-12: qty 1

## 2015-01-12 MED ORDER — CEFAZOLIN SODIUM 1-5 GM-% IV SOLN
1.0000 g | Freq: Three times a day (TID) | INTRAVENOUS | Status: AC
  Administered 2015-01-12 (×2): 1 g via INTRAVENOUS
  Filled 2015-01-12 (×2): qty 50

## 2015-01-12 MED ORDER — DIPHENHYDRAMINE HCL 50 MG/ML IJ SOLN: 12.5000 mg | Freq: Four times a day (QID) | INTRAMUSCULAR | Status: DC | PRN

## 2015-01-12 MED ORDER — SODIUM CHLORIDE 0.9 % IV SOLN: 250.0000 mL | INTRAVENOUS | Status: DC

## 2015-01-12 MED ORDER — SODIUM CHLORIDE 0.9 % IV SOLN
INTRAVENOUS | Status: DC
  Administered 2015-01-12 – 2015-01-13 (×2): via INTRAVENOUS

## 2015-01-12 MED ORDER — ROCURONIUM BROMIDE 50 MG/5ML IV SOLN
INTRAVENOUS | Status: AC
  Filled 2015-01-12: qty 1

## 2015-01-12 MED ORDER — LIDOCAINE HCL (CARDIAC) 20 MG/ML IV SOLN
INTRAVENOUS | Status: AC
  Filled 2015-01-12: qty 5

## 2015-01-12 MED ORDER — BUPIVACAINE LIPOSOME 1.3 % IJ SUSP
20.0000 mL | INTRAMUSCULAR | Status: DC
  Filled 2015-01-12 (×2): qty 20

## 2015-01-12 MED ORDER — MIDAZOLAM HCL 2 MG/2ML IJ SOLN
INTRAMUSCULAR | Status: AC
  Filled 2015-01-12: qty 2

## 2015-01-12 MED ORDER — AZELASTINE-FLUTICASONE 137-50 MCG/ACT NA SUSP: 1.0000 | Freq: Every day | NASAL | Status: DC | PRN

## 2015-01-12 MED ORDER — OXYCODONE-ACETAMINOPHEN 5-325 MG PO TABS
ORAL_TABLET | ORAL | Status: AC
  Filled 2015-01-12: qty 2

## 2015-01-12 MED ORDER — PHENOL 1.4 % MT LIQD: 1.0000 | OROMUCOSAL | Status: DC | PRN

## 2015-01-12 MED ORDER — ONDANSETRON HCL 4 MG/2ML IJ SOLN: 4.0000 mg | Freq: Four times a day (QID) | INTRAMUSCULAR | Status: DC | PRN

## 2015-01-12 MED ORDER — ALBUTEROL SULFATE HFA 108 (90 BASE) MCG/ACT IN AERS
INHALATION_SPRAY | RESPIRATORY_TRACT | Status: AC
  Filled 2015-01-12: qty 6.7

## 2015-01-12 MED ORDER — VANCOMYCIN HCL 1000 MG IV SOLR
INTRAVENOUS | Status: DC | PRN
  Administered 2015-01-12 (×2): 1000 mg via TOPICAL

## 2015-01-12 MED ORDER — MORPHINE SULFATE (PF) 1 MG/ML IV SOLN
INTRAVENOUS | Status: DC
  Administered 2015-01-12 (×3): via INTRAVENOUS
  Administered 2015-01-13: 1 mg via INTRAVENOUS
  Filled 2015-01-12 (×3): qty 25

## 2015-01-12 MED ORDER — ACETAMINOPHEN 650 MG RE SUPP: 650.0000 mg | RECTAL | Status: DC | PRN

## 2015-01-12 MED ORDER — FENTANYL CITRATE 0.05 MG/ML IJ SOLN
INTRAMUSCULAR | Status: DC | PRN
  Administered 2015-01-12 (×2): 50 ug via INTRAVENOUS
  Administered 2015-01-12: 100 ug via INTRAVENOUS
  Administered 2015-01-12: 50 ug via INTRAVENOUS
  Administered 2015-01-12: 100 ug via INTRAVENOUS
  Administered 2015-01-12 (×4): 50 ug via INTRAVENOUS

## 2015-01-12 MED ORDER — DIAZEPAM 5 MG PO TABS
ORAL_TABLET | ORAL | Status: AC
  Filled 2015-01-12: qty 1

## 2015-01-12 MED ORDER — FLUTICASONE PROPIONATE 50 MCG/ACT NA SUSP: 1.0000 | Freq: Every day | NASAL | Status: DC | PRN

## 2015-01-12 MED ORDER — ONDANSETRON HCL 4 MG/2ML IJ SOLN: 4.0000 mg | INTRAMUSCULAR | Status: DC | PRN

## 2015-01-12 MED ORDER — DIPHENHYDRAMINE HCL 12.5 MG/5ML PO ELIX: 12.5000 mg | ORAL_SOLUTION | Freq: Four times a day (QID) | ORAL | Status: DC | PRN

## 2015-01-12 MED ORDER — NEOSTIGMINE METHYLSULFATE 10 MG/10ML IV SOLN
INTRAVENOUS | Status: DC | PRN
  Administered 2015-01-12: 4 mg via INTRAVENOUS

## 2015-01-12 MED ORDER — THROMBIN 20000 UNITS EX SOLR
CUTANEOUS | Status: DC | PRN
  Administered 2015-01-12: 09:00:00 via TOPICAL

## 2015-01-12 MED ORDER — MENTHOL 3 MG MT LOZG: 1.0000 | LOZENGE | OROMUCOSAL | Status: DC | PRN

## 2015-01-12 MED ORDER — BUPIVACAINE LIPOSOME 1.3 % IJ SUSP
INTRAMUSCULAR | Status: DC | PRN
  Administered 2015-01-12: 20 mL

## 2015-01-12 MED ORDER — ZOLPIDEM TARTRATE 5 MG PO TABS: 5.0000 mg | ORAL_TABLET | Freq: Every evening | ORAL | Status: DC | PRN

## 2015-01-12 MED ORDER — VANCOMYCIN HCL 1000 MG IV SOLR
INTRAVENOUS | Status: AC
  Filled 2015-01-12: qty 2000

## 2015-01-12 MED ORDER — 0.9 % SODIUM CHLORIDE (POUR BTL) OPTIME
TOPICAL | Status: DC | PRN
  Administered 2015-01-12: 1000 mL

## 2015-01-12 MED ORDER — LIDOCAINE HCL (CARDIAC) 20 MG/ML IV SOLN
INTRAVENOUS | Status: DC | PRN
  Administered 2015-01-12: 50 mg via INTRAVENOUS

## 2015-01-12 MED ORDER — DEXAMETHASONE SODIUM PHOSPHATE 4 MG/ML IJ SOLN
INTRAMUSCULAR | Status: DC | PRN
  Administered 2015-01-12: 4 mg via INTRAVENOUS

## 2015-01-12 MED ORDER — FENTANYL CITRATE 0.05 MG/ML IJ SOLN
INTRAMUSCULAR | Status: AC
  Filled 2015-01-12: qty 5

## 2015-01-12 MED ORDER — SODIUM CHLORIDE 0.9 % IJ SOLN
INTRAMUSCULAR | Status: AC
  Filled 2015-01-12: qty 10

## 2015-01-12 MED ORDER — MAGNESIUM HYDROXIDE 400 MG/5ML PO SUSP: 30.0000 mL | Freq: Every day | ORAL | Status: DC | PRN

## 2015-01-12 MED ORDER — LACTATED RINGERS IV SOLN
INTRAVENOUS | Status: DC | PRN
  Administered 2015-01-12 (×2): via INTRAVENOUS

## 2015-01-12 MED ORDER — GLYCOPYRROLATE 0.2 MG/ML IJ SOLN
INTRAMUSCULAR | Status: DC | PRN
  Administered 2015-01-12: 0.6 mg via INTRAVENOUS

## 2015-01-12 MED ORDER — HYDROMORPHONE HCL 1 MG/ML IJ SOLN
0.2500 mg | INTRAMUSCULAR | Status: DC | PRN
  Administered 2015-01-12 (×4): 0.5 mg via INTRAVENOUS

## 2015-01-12 SURGICAL SUPPLY — 73 items
BENZOIN TINCTURE PRP APPL 2/3 (GAUZE/BANDAGES/DRESSINGS) ×3 IMPLANT
BLADE CLIPPER SURG (BLADE) ×3 IMPLANT
BUR ACORN 6.0 (BURR) ×2 IMPLANT
BUR ACORN 6.0MM (BURR) ×1
BUR MATCHSTICK NEURO 3.0 LAGG (BURR) ×3 IMPLANT
CANISTER SUCT 3000ML PPV (MISCELLANEOUS) ×3 IMPLANT
CAP LOCKING THREADED (Cap) ×12 IMPLANT
CLOSURE WOUND 1/2 X4 (GAUZE/BANDAGES/DRESSINGS) ×1
CONT SPEC 4OZ CLIKSEAL STRL BL (MISCELLANEOUS) ×3 IMPLANT
COVER BACK TABLE 60X90IN (DRAPES) ×3 IMPLANT
DRAPE C-ARM 42X72 X-RAY (DRAPES) ×6 IMPLANT
DRAPE LAPAROTOMY 100X72X124 (DRAPES) ×3 IMPLANT
DRAPE POUCH INSTRU U-SHP 10X18 (DRAPES) ×3 IMPLANT
DRSG OPSITE POSTOP 4X6 (GAUZE/BANDAGES/DRESSINGS) ×3 IMPLANT
DRSG PAD ABDOMINAL 8X10 ST (GAUZE/BANDAGES/DRESSINGS) ×3 IMPLANT
DURAPREP 26ML APPLICATOR (WOUND CARE) ×3 IMPLANT
ELECT BLADE 4.0 EZ CLEAN MEGAD (MISCELLANEOUS) ×3
ELECT REM PT RETURN 9FT ADLT (ELECTROSURGICAL) ×3
ELECTRODE BLDE 4.0 EZ CLN MEGD (MISCELLANEOUS) ×1 IMPLANT
ELECTRODE REM PT RTRN 9FT ADLT (ELECTROSURGICAL) ×1 IMPLANT
EVACUATOR 1/8 PVC DRAIN (DRAIN) IMPLANT
EVACUATOR 3/16  PVC DRAIN (DRAIN) ×2
EVACUATOR 3/16 PVC DRAIN (DRAIN) ×1 IMPLANT
GAUZE SPONGE 4X4 12PLY STRL (GAUZE/BANDAGES/DRESSINGS) ×3 IMPLANT
GAUZE SPONGE 4X4 16PLY XRAY LF (GAUZE/BANDAGES/DRESSINGS) IMPLANT
GLOVE BIO SURGEON STRL SZ8 (GLOVE) ×3 IMPLANT
GLOVE BIOGEL M 8.0 STRL (GLOVE) ×3 IMPLANT
GLOVE BIOGEL PI IND STRL 7.5 (GLOVE) ×2 IMPLANT
GLOVE BIOGEL PI INDICATOR 7.5 (GLOVE) ×4
GLOVE EXAM NITRILE LRG STRL (GLOVE) IMPLANT
GLOVE EXAM NITRILE MD LF STRL (GLOVE) IMPLANT
GLOVE EXAM NITRILE XL STR (GLOVE) IMPLANT
GLOVE EXAM NITRILE XS STR PU (GLOVE) IMPLANT
GLOVE INDICATOR 8.5 STRL (GLOVE) ×3 IMPLANT
GLOVE SURG SS PI 7.0 STRL IVOR (GLOVE) ×6 IMPLANT
GOWN STRL REUS W/ TWL LRG LVL3 (GOWN DISPOSABLE) ×1 IMPLANT
GOWN STRL REUS W/ TWL XL LVL3 (GOWN DISPOSABLE) ×2 IMPLANT
GOWN STRL REUS W/TWL 2XL LVL3 (GOWN DISPOSABLE) IMPLANT
GOWN STRL REUS W/TWL LRG LVL3 (GOWN DISPOSABLE) ×2
GOWN STRL REUS W/TWL XL LVL3 (GOWN DISPOSABLE) ×4
KIT BASIN OR (CUSTOM PROCEDURE TRAY) ×3 IMPLANT
KIT INFUSE LRG II (Orthopedic Implant) ×3 IMPLANT
KIT ROOM TURNOVER OR (KITS) ×3 IMPLANT
NEEDLE HYPO 18GX1.5 BLUNT FILL (NEEDLE) IMPLANT
NEEDLE HYPO 21X1.5 SAFETY (NEEDLE) ×6 IMPLANT
NEEDLE HYPO 25X1 1.5 SAFETY (NEEDLE) IMPLANT
NS IRRIG 1000ML POUR BTL (IV SOLUTION) ×3 IMPLANT
PACK FOAM VITOSS 10CC (Orthopedic Implant) ×6 IMPLANT
PACK LAMINECTOMY NEURO (CUSTOM PROCEDURE TRAY) ×3 IMPLANT
PAD ARMBOARD 7.5X6 YLW CONV (MISCELLANEOUS) ×9 IMPLANT
PATTIES SURGICAL .5 X1 (DISPOSABLE) IMPLANT
PATTIES SURGICAL .5 X3 (DISPOSABLE) IMPLANT
ROD 85MM SPINAL (Rod) ×6 IMPLANT
SCREW 6.5X45 (Screw) ×12 IMPLANT
SPONGE LAP 4X18 X RAY DECT (DISPOSABLE) IMPLANT
SPONGE NEURO XRAY DETECT 1X3 (DISPOSABLE) IMPLANT
SPONGE SURGIFOAM ABS GEL 100 (HEMOSTASIS) ×3 IMPLANT
STAPLER VISISTAT 35W (STAPLE) ×3 IMPLANT
STIMULATOR SPF PLUS 60/M (Stimulator) ×3 IMPLANT
STRIP CLOSURE SKIN 1/2X4 (GAUZE/BANDAGES/DRESSINGS) ×2 IMPLANT
SUT VIC AB 1 CT1 18XBRD ANBCTR (SUTURE) ×2 IMPLANT
SUT VIC AB 1 CT1 8-18 (SUTURE) ×4
SUT VIC AB 2-0 CP2 18 (SUTURE) ×6 IMPLANT
SUT VIC AB 3-0 SH 8-18 (SUTURE) ×3 IMPLANT
SYR 20CC LL (SYRINGE) ×3 IMPLANT
SYR 20ML ECCENTRIC (SYRINGE) ×3 IMPLANT
SYR 5ML LL (SYRINGE) IMPLANT
TAPE CLOTH SURG 4X10 WHT LF (GAUZE/BANDAGES/DRESSINGS) ×3 IMPLANT
TOWEL OR 17X24 6PK STRL BLUE (TOWEL DISPOSABLE) ×3 IMPLANT
TOWEL OR 17X26 10 PK STRL BLUE (TOWEL DISPOSABLE) ×3 IMPLANT
TRAY FOLEY CATH 14FRSI W/METER (CATHETERS) IMPLANT
TRAY FOLEY CATH 16FRSI W/METER (SET/KITS/TRAYS/PACK) ×3 IMPLANT
WATER STERILE IRR 1000ML POUR (IV SOLUTION) ×3 IMPLANT

## 2015-01-12 NOTE — Progress Notes (Signed)
Orthopedic Tech Progress Note Patient Details:  Bradley Espinoza 1956-07-22 [id number]  Patient ID: [name], male   DOB: 09-12-1956, 59 y.o.   MRN: [id number] RN stated that pt already has the aspen lumbar fusion brace  [name] 01/12/2015, 4:14 PM

## 2015-01-12 NOTE — Anesthesia Postprocedure Evaluation (Signed)
  Anesthesia Post-op Note  Patient: Bradley Espinoza  Procedure(s) Performed: Procedure(s) (LRB): Lumbar one-Lumbar three Posterior lumbar fusion with pedicle screws with internal bone growth stimulator (N/A)  Patient Location: PACU  Anesthesia Type: General  Level of Consciousness: awake and alert   Airway and Oxygen Therapy: Patient Spontanous Breathing  Post-op Pain: mild  Post-op Assessment: Post-op Vital signs reviewed, Patient's Cardiovascular Status Stable, Respiratory Function Stable, Patent Airway and No signs of Nausea or vomiting  Last Vitals:  Filed Vitals:   01/12/15 1109  BP:   Pulse: 65  Temp:   Resp: 27    Post-op Vital Signs: stable   Complications: No apparent anesthesia complications

## 2015-01-12 NOTE — Transfer of Care (Signed)
Immediate Anesthesia Transfer of Care Note  Patient: Bradley Espinoza  Procedure(s) Performed: Procedure(s) with comments: Lumbar one-Lumbar three Posterior lumbar fusion with pedicle screws with internal bone growth stimulator (N/A) - L1-L3 Posterior lumbar fusion with pedicle screws  Patient Location: PACU  Anesthesia Type:General  Level of Consciousness: awake and alert   Airway & Oxygen Therapy: Patient Spontanous Breathing and Patient connected to nasal cannula oxygen  Post-op Assessment: Report given to RN and Post -op Vital signs reviewed and stable  Post vital signs: Reviewed and stable  Last Vitals:  Filed Vitals:   01/12/15 0649  BP:   Pulse: 64  Temp: 36.7 C  Resp:     Complications: No apparent anesthesia complications

## 2015-01-12 NOTE — Progress Notes (Signed)
Orthopedic Tech Progress Note Patient Details:  Bradley Espinoza July 22, 1956 [id number] Called [facility] for brace order. Patient ID: DARIC KOREN, male   DOB: 1955-12-01, 59 y.o.   MRN: [id number]   Braulio Bosch 01/12/2015, 3:27 PM

## 2015-01-12 NOTE — Anesthesia Preprocedure Evaluation (Signed)
Anesthesia Evaluation  Patient identified by MRN, date of birth, ID band Patient awake    Reviewed: Allergy & Precautions, NPO status , Patient's Chart, lab work & pertinent test results  Airway Mallampati: II  TM Distance: >3 FB Neck ROM: Full    Dental no notable dental hx.    Pulmonary neg pulmonary ROS, former smoker,  breath sounds clear to auscultation  Pulmonary exam normal       Cardiovascular negative cardio ROS  Rhythm:Regular Rate:Normal     Neuro/Psych Anxiety negative neurological ROS     GI/Hepatic Neg liver ROS, GERD-  Medicated,  Endo/Other  negative endocrine ROS  Renal/GU negative Renal ROS  negative genitourinary   Musculoskeletal negative musculoskeletal ROS (+)   Abdominal   Peds negative pediatric ROS (+)  Hematology  (+) anemia ,   Anesthesia Other Findings   Reproductive/Obstetrics negative OB ROS                             Anesthesia Physical Anesthesia Plan  ASA: II  Anesthesia Plan: General   Post-op Pain Management:    Induction: Intravenous  Airway Management Planned: Oral ETT  Additional Equipment:   Intra-op Plan:   Post-operative Plan: Extubation in OR  Informed Consent: I have reviewed the patients History and Physical, chart, labs and discussed the procedure including the risks, benefits and alternatives for the proposed anesthesia with the patient or authorized representative who has indicated his/her understanding and acceptance.   Dental advisory given  Plan Discussed with: CRNA and Surgeon  Anesthesia Plan Comments:         Anesthesia Quick Evaluation

## 2015-01-13 MED ORDER — HYDROMORPHONE HCL 1 MG/ML IJ SOLN
1.0000 mg | INTRAMUSCULAR | Status: DC | PRN
  Administered 2015-01-13 – 2015-01-18 (×23): 1 mg via INTRAVENOUS
  Filled 2015-01-13 (×23): qty 1

## 2015-01-13 MED ORDER — OXYCODONE HCL 5 MG PO TABS
15.0000 mg | ORAL_TABLET | ORAL | Status: DC | PRN
  Administered 2015-01-13 – 2015-01-18 (×24): 15 mg via ORAL
  Filled 2015-01-13 (×24): qty 3

## 2015-01-13 MED FILL — Sodium Chloride IV Soln 0.9%: INTRAVENOUS | Qty: 1000 | Status: AC

## 2015-01-13 MED FILL — Heparin Sodium (Porcine) Inj 1000 Unit/ML: INTRAMUSCULAR | Qty: 30 | Status: AC

## 2015-01-13 NOTE — Evaluation (Signed)
Occupational Therapy Evaluation Patient Details Name: Bradley Espinoza MRN: [id number] DOB: 1956-01-01 Today's Date: 01/13/2015    History of Present Illness Patient is a 59 yo male s/p Lumbar one-Lumbar three Posterior lumbar fusion with pedicle screws with internal bone growth stimulator    Clinical Impression   Patient independent PTA. Patient currently functioning at an overall min guard>min assist level. Patient will benefit from acute OT to increase overall independence in the areas of ADLs, functional mobility, and overall safety in order to safely discharge home. Patient states that his mother will be available to assist him 24/7 post acute discharge.      Follow Up Recommendations  No OT follow up;Supervision/Assistance - 24 hour    Equipment Recommendations  3 in 1 bedside comode    Recommendations for Other Services  None at this time   Precautions / Restrictions Precautions Precautions: Back Precaution Booklet Issued: Yes (comment) Precaution Comments: reviewed back precautions multiple times during session Required Braces or Orthoses: Spinal Brace Spinal Brace: Thoracolumbosacral orthotic (Per MD Botero, may mobilize without at the time being) Restrictions Weight Bearing Restrictions: No      Mobility Bed Mobility - Per PT eval Overal bed mobility: Needs Assistance Bed Mobility: Rolling;Sidelying to Sit Rolling: Min guard Sidelying to sit: Min assist       General bed mobility comments: Patient seated in recliner upon OT entering room  Transfers Overall transfer level: Needs assistance Equipment used: Rolling walker (2 wheeled) Transfers: Sit to/from Stand Sit to Stand: Min guard         General transfer comment: Cues for back precautions, safety, and hand placement     Balance Overall balance assessment: Needs assistance Sitting-balance support: No upper extremity supported;Feet supported Sitting balance-Leahy Scale: Good     Standing  balance support: Bilateral upper extremity supported;During functional activity Standing balance-Leahy Scale: Fair     ADL Overall ADL's : Needs assistance/impaired   General ADL Comments: Patient overall supervision>min assist with functional tasks and transfers. Patient barely able to cross BLEs for LB ADLs, encouraged patient to work on crossing LEs as an exercise. Reiterated back precautions and the importance of adhering to them multiple times during the session.     Pertinent Vitals/Pain Pain Assessment: 0-10 Pain Score: 9  Pain Location: low back Pain Descriptors / Indicators: Aching Pain Intervention(s): Monitored during session     Hand Dominance Right   Extremity/Trunk Assessment Upper Extremity Assessment Upper Extremity Assessment: Overall WFL for tasks assessed   Lower Extremity Assessment Lower Extremity Assessment: Generalized weakness   Cervical / Trunk Assessment Cervical / Trunk Assessment: Normal   Communication Communication Communication: No difficulties   Cognition Arousal/Alertness: Awake/alert Behavior During Therapy: WFL for tasks assessed/performed Overall Cognitive Status: Within Functional Limits for tasks assessed              Home Living Family/patient expects to be discharged to:: Private residence Living Arrangements: Alone   Type of Home: House Home Access: Stairs to enter Technical brewer of Steps: 1 Entrance Stairs-Rails: None Home Layout: One level     Bathroom Shower/Tub: Corporate investment banker: Standard     Home Equipment: Environmental consultant - 2 wheels;Walker - 4 wheels;Bedside commode;Shower seat;Wheelchair - manual   Prior Functioning/Environment   Comments: primarily using a wheel chair priot to admission    OT Diagnosis: Generalized weakness;Acute pain   OT Problem List: Decreased strength;Decreased range of motion;Decreased activity tolerance;Impaired balance (sitting and/or standing);Decreased  safety awareness;Decreased knowledge of use of  DME or AE;Decreased knowledge of precautions;Pain   OT Treatment/Interventions: Self-care/ADL training;Energy conservation;DME and/or AE instruction;Therapeutic activities;Patient/family education;Balance training    OT Goals(Current goals can be found in the care plan section) Acute Rehab OT Goals Patient Stated Goal: to go home OT Goal Formulation: With patient Time For Goal Achievement: 01/20/15 Potential to Achieve Goals: Good ADL Goals Pt Will Perform Grooming: with modified independence;sitting Pt Will Perform Upper Body Bathing: Independently;sitting Pt Will Perform Lower Body Bathing: with modified independence;sit to/from stand;with adaptive equipment Pt Will Perform Upper Body Dressing: Independently;sitting Pt Will Perform Lower Body Dressing: with modified independence;sit to/from stand;with adaptive equipment Pt Will Transfer to Toilet: with modified independence;ambulating;bedside commode Pt Will Perform Toileting - Clothing Manipulation and hygiene: with modified independence;sit to/from stand Pt Will Perform Tub/Shower Transfer: Tub transfer;ambulating;3 in 1;rolling walker;with modified independence  OT Frequency: Min 2X/week   Barriers to D/C: None known at this time          End of Session Equipment Utilized During Treatment: Rolling walker (Pt supposed to have back brace, not one in room. Dr verbally told PT that pt can participate in therapy today without it)  Activity Tolerance: Patient tolerated treatment well Patient left: in chair;with call bell/phone within reach   Time: 1100-1123 OT Time Calculation (min): 23 min Charges:  OT General Charges $OT Visit: 1 Procedure OT Evaluation $Initial OT Evaluation Tier I: 1 Procedure OT Treatments $Self Care/Home Management : 8-22 mins  Bradley Espinoza , MS, OTR/L, CLT Pager: [phone]  01/13/2015, 11:38 AM

## 2015-01-13 NOTE — Clinical Social Work Note (Signed)
CSW consult acknowledged for SNF placement.  PT/OT recommending PT- Home Health and OT- No OT follow up.   Clinical Social Worker will sign off for now as social work intervention is no longer needed. Please consult [place] again if new need arises.  Glendon Axe, MSW, [place] [phone] 01/13/2015 2:05 PM

## 2015-01-13 NOTE — Evaluation (Signed)
Physical Therapy Evaluation Patient Details Name: Bradley Espinoza MRN: [id number] DOB: March 26, 1956 Today's Date: 01/13/2015   History of Present Illness  Patient is a 59 yo male s/p Lumbar one-Lumbar three Posterior lumbar fusion with pedicle screws with internal bone growth stimulator   Clinical Impression  Patient demonstrates deficits in functional mobility as indicated below. Will need continued skilled PT to address deficits and maximize function. Will see as indicated and progress as tolerated. At this time, patient expressing several concerns regarding pain management. MD aware. Provided patient with extensive education regarding precautions and compliance, reviewed handout and performed teach back. Increased time for education required this session.     Follow Up Recommendations Home health PT;Supervision/Assistance - 24 hour    Equipment Recommendations  None recommended by PT    Recommendations for Other Services       Precautions / Restrictions Precautions Precautions: Back Precaution Booklet Issued: Yes (comment) Precaution Comments: hand out provided, verbally educated, will need reinforcement Required Braces or Orthoses: Spinal Brace Spinal Brace: Thoracolumbosacral orthotic (Per MD Botero, may mobilize without at the time being) Restrictions Weight Bearing Restrictions: No      Mobility  Bed Mobility Overal bed mobility: Needs Assistance Bed Mobility: Rolling;Sidelying to Sit Rolling: Min guard Sidelying to sit: Min assist       General bed mobility comments: VCs for posture and positinoing, assist to elevate trunk to EOB  Transfers Overall transfer level: Needs assistance Equipment used: Rolling walker (2 wheeled) Transfers: Sit to/from Stand Sit to Stand: Min assist         General transfer comment: VCs for hand placement and technique, assist to elevate to standing, assist for stability  Ambulation/Gait Ambulation/Gait assistance: Min  guard Ambulation Distance (Feet): 12 Feet Assistive device: Rolling walker (2 wheeled) Gait Pattern/deviations: Step-to pattern;Decreased stride length Gait velocity: decreased   General Gait Details: guarded with mobility secondary to increased pain  Stairs            Wheelchair Mobility    Modified Rankin (Stroke Patients Only)       Balance Overall balance assessment: Needs assistance Sitting-balance support: Feet supported Sitting balance-Leahy Scale: Good     Standing balance support: Bilateral upper extremity supported;During functional activity Standing balance-Leahy Scale: Fair                               Pertinent Vitals/Pain Pain Assessment: 0-10 Pain Score: 10-Worst pain ever Pain Location: low back Pain Descriptors / Indicators: Aching    Home Living Family/patient expects to be discharged to:: Private residence Living Arrangements: Alone   Type of Home: House Home Access: Stairs to enter Entrance Stairs-Rails: None Entrance Stairs-Number of Steps: 1 Home Layout: One level Home Equipment: Environmental consultant - 2 wheels;Walker - 4 wheels;Bedside commode;Shower seat;Wheelchair - manual      Prior Function           Comments: primarily using a wheel chair priot to admission     Hand Dominance   Dominant Hand: Right    Extremity/Trunk Assessment   Upper Extremity Assessment: Overall WFL for tasks assessed           Lower Extremity Assessment: Generalized weakness         Communication   Communication: No difficulties  Cognition Arousal/Alertness: Awake/alert Behavior During Therapy: Restless;Impulsive Overall Cognitive Status: Within Functional Limits for tasks assessed  General Comments      Exercises        Assessment/Plan    PT Assessment Patient needs continued PT services  PT Diagnosis Difficulty walking;Acute pain   PT Problem List Decreased strength;Decreased activity  tolerance;Decreased balance;Decreased mobility;Decreased knowledge of use of DME;Decreased safety awareness;Pain  PT Treatment Interventions DME instruction;Gait training;Functional mobility training;Therapeutic activities;Therapeutic exercise;Balance training;Patient/family education   PT Goals (Current goals can be found in the Care Plan section) Acute Rehab PT Goals Patient Stated Goal: to go home PT Goal Formulation: With patient Time For Goal Achievement: 01/27/15 Potential to Achieve Goals: Good    Frequency Min 5X/week   Barriers to discharge        Co-evaluation               End of Session Equipment Utilized During Treatment: Gait belt Activity Tolerance: No increased pain Patient left: in chair;with call bell/phone within reach Nurse Communication: Mobility status;Patient requests pain meds;Precautions         Time: 3016-0109 PT Time Calculation (min) (ACUTE ONLY): 52 min   Charges:   PT Evaluation $Initial PT Evaluation Tier I: 1 Procedure PT Treatments $Therapeutic Activity: 8-22 mins $Self Care/Home Management: 2023-06-09   PT G CodesDuncan Dull 2015-01-23, 10:12 AM Alben Deeds, PT DPT  [phone]

## 2015-01-13 NOTE — Progress Notes (Signed)
16 mg morphine PCA wasted in sink and documented pyxis. 9 mg administered to patient via PCA.  Wasted with Azell Der, RN.

## 2015-01-13 NOTE — Progress Notes (Signed)
Patient ID: Bradley Espinoza, male   DOB: 03-Nov-1955, 59 y.o.   MRN: [id number] C/o incisional pain. No weakness.sensory wnl.

## 2015-01-13 NOTE — Op Note (Signed)
NAME:  Bradley Espinoza, Bradley Espinoza NO.:  [id number]  MEDICAL RECORD NO.:  [id number]  LOCATION:  [id number]                        FACILITY:  [facility]  PHYSICIAN:  Leeroy Cha, M.D.   DATE OF BIRTH:  March 18, 1956  DATE OF PROCEDURE:  01/12/2015 DATE OF DISCHARGE:                              OPERATIVE REPORT   PREOPERATIVE DIAGNOSIS:  Fracture L2, status post fusion L4-L5, L5-S1.  POSTOPERATIVE DIAGNOSIS:  Fracture L2, status post fusion L4-L5, L5-S1.  PROCEDURE:  L1 to L3 fusion with pedicle screws, posterolateral arthrodesis with Vitoss and MBP.Insertion of a bone graft stimulator  SURGEON:  Leeroy Cha, MD.  ASSISTANT:  Kary Kos, M.D.  CLINICAL HISTORY:  Bradley Espinoza is a gentleman who many years ago underwent fusion by me at the level L4-L5, L5-S1.  The patient did well, but unfortunately he fell.  He ended up in the emergency room with a fracture of L2 plus open compound fracture of the foot.  He was taken to surgery by the orthopedic surgeon.  We decided to put him on a TLSO TYPE OF__________ brace. Nevertheless, the patient will be followed by me in my office, and he has developed more kyphosis.  He developed more pain in the L2 fracture _AREA_________.  Neurologically, he is intact.  We decided to do surgery in view of no improvement.  He knew the risks and benefits.  DESCRIPTION OF PROCEDURE:  The patient was taken to the OR, and after intubation, he was positioned in a prone manner.  The back was cleaned with Betadine and DuraPrep.  Then, a midline incision was made from the lower part all the way up to the thoracolumbar area.  Muscles were retracted laterally.  We were able to see the pedicle of L4.  From then on with the x-ray, we identified L1, L2, L3.  Using the drill, we drilled the pedicle of L1 and L3 bilaterally.  There was good position of the opening.  A probe was inserted.  All the 4 holes were surrounded by bone.  Then, 4 screws of 5.5 x 45 were  inserted.  The screws were kept in place with a lordotic rod and kept in place with caps.  Then, the lateral aspect of the L1-L2, L2-3, and L3-L4 facet was cleaned off periosteum as well as the lamina spinous process.  Then, a mix of BMP and Vitoss were used for arthrodesis.  Then, we made a pocket on the right side subcutaneously.  We introduced a bone graft stimulator.  The area was irrigated.  Hemovac was left into the area.  Vancomycin powder was left on the operative site, and the wound was closed with Vicryl and Steri-Strips.          ______________________________ Leeroy Cha, M.D.    EB/MEDQ  D:  01/12/2015  T:  01/13/2015  Job:  [id number]

## 2015-01-14 MED ORDER — POLYETHYLENE GLYCOL 3350 17 G PO PACK
17.0000 g | PACK | Freq: Every day | ORAL | Status: DC
  Administered 2015-01-14 – 2015-01-18 (×4): 17 g via ORAL
  Filled 2015-01-14 (×5): qty 1

## 2015-01-14 NOTE — Progress Notes (Signed)
UR complete.  Dewey Neukam RN, MSN 

## 2015-01-14 NOTE — Progress Notes (Signed)
Patient ID: Bradley Espinoza, male   DOB: [birth year], 59 y.o.   MRN: [id number] C/o incisional pain, no weakness.  Rehab to see

## 2015-01-14 NOTE — Progress Notes (Signed)
Occupational Therapy Treatment Patient Details Name: Bradley Espinoza MRN: [id number] DOB: [birth year] Today's Date: 01/14/2015    History of present illness Patient is a 59 yo male s/p Lumbar one-Lumbar three Posterior lumbar fusion with pedicle screws with internal bone growth stimulator    OT comments  Patient progressing nicely towards goals, continue plan of care for now. Patient continues to be limited secondary to pain. Reiterated importance of recommended 24/7 supervision/assistance post discharge.    Follow Up Recommendations  No OT follow up;Supervision/Assistance - 24 hour    Equipment Recommendations  3 in 1 bedside comode    Recommendations for Other Services  None at this time   Precautions / Restrictions Precautions Precautions: Back Precaution Comments: Reviewed back precautions Required Braces or Orthoses: Spinal Brace Spinal Brace: Thoracolumbosacral orthotic Restrictions Weight Bearing Restrictions: No       Mobility Bed Mobility General bed mobility comments: Patient found in gym with PT  Transfers Overall transfer level: Needs assistance Equipment used: Rolling walker (2 wheeled) Transfers: Sit to/from Stand Sit to Stand: Supervision    General transfer comment: Cues for safety and technique     Balance Overall balance assessment: Needs assistance Sitting-balance support: No upper extremity supported;Feet supported Sitting balance-Leahy Scale: Good     Standing balance support: Bilateral upper extremity supported;During functional activity Standing balance-Leahy Scale: Fair     ADL General ADL Comments: Patient found in gym with PT. Worked with patient on crossing BLEs for LB ADLs, patient able to complete this and doff/don socks independently. Patient then practiced tub/shower transfer using BSC. Patient with increased pain this session, which limits his mobility and safety. Reiterated recommendation of 24/7 supervision post acute d/c at this  time. Patient then ambulated back to room and left seated in recliner. Patient with complaints of not being able to sit back and stiffnes secodnary to brace. Encouarged patient to call nurse to ask about medication.      Cognition   Behavior During Therapy: WFL for tasks assessed/performed Overall Cognitive Status: Within Functional Limits for tasks assessed                 Pertinent Vitals/ Pain       Pain Assessment: 0-10 Pain Score: 10-Worst pain ever Pain Location: low back (decreased > 9 towards end of session) Pain Descriptors / Indicators: Aching;Tightness Pain Intervention(s): Monitored during session         Frequency Min 2X/week     Progress Toward Goals  OT Goals(current goals can now be found in the care plan section)  Progress towards OT goals: Progressing toward goals     Plan Discharge plan remains appropriate       End of Session Equipment Utilized During Treatment: Rolling walker;Back brace   Activity Tolerance Patient tolerated treatment well;Patient limited by pain   Patient Left in chair;with call bell/phone within reach     Time: 0840-0905 OT Time Calculation (min): 25 min  Charges: OT General Charges $OT Visit: 1 Procedure OT Treatments $Self Care/Home Management : 23-37 mins  Makani Seckman , MS, OTR/L, CLT Pager: [phone]  01/14/2015, 9:30 AM

## 2015-01-14 NOTE — Progress Notes (Signed)
Physical medicine rehabilitation consult requested chart reviewed. Patient status post L2 fracture. Physical and occupational therapy evaluations completed 01/13/2015 progressing nicely and as of 01/14/2015 requiring supervision minimal guard 110 feet with a rolling walker. Recommendations are for home health therapies. Patient will not need inpatient rehabilitation services at this time. Recommend discharge to home with home therapies. Hold on formal rehabilitation consult at this time

## 2015-01-14 NOTE — Progress Notes (Signed)
Physical Therapy Treatment Patient Details Name: Bradley Espinoza MRN: [id number] DOB: 1956-02-03 Today's Date: 01/14/2015    History of Present Illness Patient is a 59 yo male s/p Lumbar one-Lumbar three Posterior lumbar fusion with pedicle screws with internal bone growth stimulator     PT Comments    Patient performed increased ambulation, stair negotiation and increased education for safety and mobility with compliance to back precautions. Patient tolerated well despite reports of 9-10/10 pain. Educated regarding positioning and pain management strategies. Will continue to see and progress as tolerated.   Follow Up Recommendations  Home health PT;Supervision/Assistance - 24 hour     Equipment Recommendations  None recommended by PT    Recommendations for Other Services       Precautions / Restrictions Precautions Precautions: Back Precaution Booklet Issued: Yes (comment) Precaution Comments: Reviewed back precautions (Verballay recalled 3/3 precautions this session) Required Braces or Orthoses: Spinal Brace Spinal Brace: Thoracolumbosacral orthotic Restrictions Weight Bearing Restrictions: No    Mobility  Bed Mobility Overal bed mobility: Needs Assistance Bed Mobility: Rolling;Sidelying to Sit Rolling: Min guard Sidelying to sit: Min assist       General bed mobility comments: patient educated on positioning and technique, cues for safety and compliance with precautions.   Transfers Overall transfer level: Needs assistance Equipment used: Rolling walker (2 wheeled) Transfers: Sit to/from Stand Sit to Stand: Supervision         General transfer comment: Cues for safety and technique   Ambulation/Gait Ambulation/Gait assistance: Supervision;Min guard Ambulation Distance (Feet): 110 Feet Assistive device: Rolling walker (2 wheeled) Gait Pattern/deviations: Step-through pattern;Decreased stride length;Antalgic Gait velocity: decreased Gait velocity  interpretation: Below normal speed for age/gender General Gait Details: guarded with mobility secondary to increased pain, but patient tolerated ingcreased activity and distance despite pain   Stairs Stairs: Yes Stairs assistance: Min guard Stair Management: Two rails;Step to pattern;Forwards Number of Stairs: 6 General stair comments: VCs for sequencing, min guard for stability secondary to first time performance post operatively  Wheelchair Mobility    Modified Rankin (Stroke Patients Only)       Balance Overall balance assessment: Needs assistance Sitting-balance support: No upper extremity supported Sitting balance-Leahy Scale: Good     Standing balance support: Bilateral upper extremity supported Standing balance-Leahy Scale: Fair                      Cognition Arousal/Alertness: Awake/alert Behavior During Therapy: WFL for tasks assessed/performed Overall Cognitive Status: Within Functional Limits for tasks assessed                      Exercises      General Comments General comments (skin integrity, edema, etc.): Reinforced precautions with patient throughout session, patient able to verbally recall but continues to require cues for implementaing. Patient educated on compliance and techniques for mobility.      Pertinent Vitals/Pain Pain Assessment: 0-10 Pain Score: 9  Pain Location: low back Pain Descriptors / Indicators: Aching;Sharp Pain Intervention(s): Monitored during session;Premedicated before session    Home Living Family/patient expects to be discharged to:: Private residence Living Arrangements: Alone   Type of Home: House Home Access: Stairs to enter Entrance Stairs-Rails: None Home Layout: One level Home Equipment: Environmental consultant - 2 wheels;Walker - 4 wheels;Bedside commode;Shower seat;Wheelchair - manual Additional Comments: d/c'd to [facility] due to inaccessible home (for w/c) and bil LE NWB status.    Prior Function Level of  Independence: Needs assistance  Comments: primarily using a wheel chair priot to admission   PT Goals (current goals can now be found in the care plan section) Acute Rehab PT Goals Patient Stated Goal: to go home PT Goal Formulation: With patient Time For Goal Achievement: 01/27/15 Potential to Achieve Goals: Good Progress towards PT goals: Progressing toward goals    Frequency  Min 5X/week    PT Plan Current plan remains appropriate    Co-evaluation             End of Session Equipment Utilized During Treatment: Gait belt Activity Tolerance: No increased pain Patient left:  (in gym with OT)     Time: 9169-4503 PT Time Calculation (min) (ACUTE ONLY): 24 min  Charges:  $Therapeutic Activity: 8-22 mins $Self Care/Home Management: 8-22                    G CodesDuncan Dull Jan 26, 2015, 10:12 AM Alben Deeds, PT DPT  [phone]

## 2015-01-15 MED ORDER — BISACODYL 10 MG RE SUPP
10.0000 mg | Freq: Once | RECTAL | Status: AC
  Administered 2015-01-15: 10 mg via RECTAL
  Filled 2015-01-15: qty 1

## 2015-01-15 MED ORDER — BISACODYL 10 MG RE SUPP: 10.0000 mg | Freq: Once | RECTAL | Status: DC

## 2015-01-15 NOTE — Progress Notes (Signed)
Physical Therapy Treatment Patient Details Name: Bradley Espinoza MRN: [id number] DOB: 10-06-1956 Today's Date: 01/15/2015    History of Present Illness Patient is a 59 yo male s/p Lumbar one-Lumbar three Posterior lumbar fusion with pedicle screws with internal bone growth stimulator     PT Comments    Patient making steady progress with mobility, continues to have difficulty QA:STMH control but mobilizing well. Ambulated increased distance in hall and performed multiple aspects of bed mobility x5 for reinforcement of precautions. Educated on technique for car transfers. Will continue to see and progress as tolerated.   Follow Up Recommendations  Home health PT;Supervision/Assistance - 24 hour     Equipment Recommendations  None recommended by PT    Recommendations for Other Services       Precautions / Restrictions Precautions Precautions: Back Precaution Booklet Issued: Yes (comment) Precaution Comments: Reviewed back precautions (Verballay recalled 3/3 precautions this session) Required Braces or Orthoses: Spinal Brace Spinal Brace: Thoracolumbosacral orthotic Restrictions Weight Bearing Restrictions: No    Mobility  Bed Mobility Overal bed mobility: Needs Assistance Bed Mobility: Rolling;Sidelying to Sit Rolling: Supervision Sidelying to sit: Supervision       General bed mobility comments: VCs to confirm technique and sequencing, patient performed x5 for reinforcement, tolerated well.   Transfers Overall transfer level: Needs assistance Equipment used: Rolling walker (2 wheeled) Transfers: Sit to/from Stand Sit to Stand: Supervision         General transfer comment: Cues for safety and technique   Ambulation/Gait Ambulation/Gait assistance: Supervision Ambulation Distance (Feet): 210 Feet Assistive device: Rolling walker (2 wheeled) Gait Pattern/deviations: Step-through pattern;Decreased stride length;Antalgic Gait velocity: decreased Gait velocity  interpretation: Below normal speed for age/gender General Gait Details: Continued cues for increased stride and cadence   Stairs            Wheelchair Mobility    Modified Rankin (Stroke Patients Only)       Balance   Sitting-balance support: No upper extremity supported Sitting balance-Leahy Scale: Good     Standing balance support: No upper extremity supported Standing balance-Leahy Scale: Fair (able to perform without UE support for brief periods of time)                      Cognition Arousal/Alertness: Awake/alert Behavior During Therapy: WFL for tasks assessed/performed Overall Cognitive Status: Within Functional Limits for tasks assessed                      Exercises      General Comments General comments (skin integrity, edema, etc.): educated on car transfers with complaince for back precuations, patient receptiv      Pertinent Vitals/Pain Pain Assessment: 0-10 Pain Score: 7  Pain Location: back pain, worse with direct pressure Pain Descriptors / Indicators: Aching Pain Intervention(s): Limited activity within patient's tolerance;Monitored during session;Repositioned;Relaxation    Home Living                      Prior Function            PT Goals (current goals can now be found in the care plan section) Acute Rehab PT Goals Patient Stated Goal: to go home PT Goal Formulation: With patient Time For Goal Achievement: 01/27/15 Potential to Achieve Goals: Good Progress towards PT goals: Progressing toward goals    Frequency  Min 5X/week    PT Plan Current plan remains appropriate    Co-evaluation  End of Session Equipment Utilized During Treatment: Gait belt Activity Tolerance: No increased pain Patient left:  (in gym with OT)     Time: 1340-1406 PT Time Calculation (min) (ACUTE ONLY): 26 min  Charges:  $Gait Training: 8-22 mins $Self Care/Home Management: 8-22                    G  CodesDuncan Dull 02/05/15, 2:10 PM Alben Deeds, [place] DPT  [phone]

## 2015-01-15 NOTE — Progress Notes (Signed)
Patient ID: Bradley Espinoza, male   DOB: 08-Feb-1956, 59 y.o.   MRN: [id number] Subjective:  The patient is alert and pleasant. His back is sore.  Objective: Vital signs in last 24 hours: Temp:  [98.2 F (36.8 C)-99 F (37.2 C)] 99 F (37.2 C) (04/09 0610) Pulse Rate:  [74-83] 80 (04/09 0610) Resp:  [17-20] 20 (04/09 0610) BP: (91-129)/(48-74) 106/62 mmHg (04/09 0610) SpO2:  [93 %-99 %] 93 % (04/09 0610)  Intake/Output from previous day: 04/08 0701 - 04/09 0700 In: -  Out: 770 [Urine:750; Drains:20] Intake/Output this shift:    Physical exam the patient is alert and pleasant. He is moving his lower extremities well.  Lab Results: No results for input(s): WBC, HGB, HCT, PLT in the last 72 hours. BMET No results for input(s): NA, K, CL, CO2, GLUCOSE, BUN, CREATININE, CALCIUM in the last 72 hours.  Studies/Results: No results found.  Assessment/Plan: Postoperative day #3: We will continue to mobilize the patient. We will discontinue his Hemovac drain.  LOS: 3 days     Makaiya Geerdes D 01/15/2015, 9:00 AM

## 2015-01-16 MED ORDER — BISACODYL 10 MG RE SUPP
10.0000 mg | Freq: Every day | RECTAL | Status: DC | PRN
  Administered 2015-01-16: 10 mg via RECTAL
  Filled 2015-01-16: qty 1

## 2015-01-16 NOTE — Progress Notes (Signed)
Subjective: Patient reports he's doing ok  Objective: Vital signs in last 24 hours: Temp:  [97.8 F (36.6 C)-98.6 F (37 C)] 97.8 F (36.6 C) (04/10 0614) Pulse Rate:  [59-82] 59 (04/10 0614) Resp:  [18-20] 18 (04/10 0614) BP: (118-147)/(52-71) 118/55 mmHg (04/10 0614) SpO2:  [95 %-100 %] 95 % (04/10 0614)  Intake/Output from previous day: 04/09 0730 - 04/10 0729 In: 840 [P.O.:840] Out: 1410 [Urine:1410] Intake/Output this shift: Total I/O In: 360 [P.O.:360] Out: 100 [Urine:100]  Neurologic: Grossly normal  Lab Results: Lab Results  Component Value Date   WBC 6.0 01/05/2015   HGB 14.6 01/05/2015   HCT 45.4 01/05/2015   MCV 88.5 01/05/2015   PLT 195 01/05/2015   Lab Results  Component Value Date   INR 0.94 09/15/2014   BMET Lab Results  Component Value Date   NA 138 01/05/2015   K 4.4 01/05/2015   CL 104 01/05/2015   CO2 26 01/05/2015   GLUCOSE 92 01/05/2015   BUN 16 01/05/2015   CREATININE 0.83 01/05/2015   CALCIUM 9.6 01/05/2015    Studies/Results: No results found.  Assessment/Plan: Stable. likely home in am   LOS: 4 days    Jnaya Butrick S 01/16/2015, 12:27 PM

## 2015-01-17 MED ORDER — CYCLOBENZAPRINE HCL 10 MG PO TABS
10.0000 mg | ORAL_TABLET | Freq: Three times a day (TID) | ORAL | Status: DC
  Administered 2015-01-17 – 2015-01-18 (×4): 10 mg via ORAL
  Filled 2015-01-17 (×4): qty 1

## 2015-01-17 NOTE — Progress Notes (Signed)
Physical Therapy Treatment Patient Details Name: Bradley Espinoza MRN: [id number] DOB: 04-29-1956 Today's Date: 01/17/2015    History of Present Illness Patient is a 59 yo male s/p Lumbar one-Lumbar three Posterior lumbar fusion with pedicle screws with internal bone growth stimulator     PT Comments    Patient with significant amount of increased pain this date, increased time to perform all activities. Patient states had a horrible night, poor pain management. Patient states onset of pain in buttocks down into left lower leg (cramping / spasm sensation). Requested pain medicine from nursing. Will continue to see and progress as tolerated.    Follow Up Recommendations  Home health PT;Supervision/Assistance - 24 hour     Equipment Recommendations  None recommended by PT    Recommendations for Other Services       Precautions / Restrictions Precautions Precautions: Back Precaution Booklet Issued: Yes (comment) Precaution Comments: Reviewed back precautions (Verballay recalled 3/3 precautions this session) Required Braces or Orthoses: Spinal Brace Spinal Brace: Thoracolumbosacral orthotic Restrictions Weight Bearing Restrictions: No    Mobility  Bed Mobility Overal bed mobility: Needs Assistance Bed Mobility: Rolling;Sidelying to Sit Rolling: Supervision Sidelying to sit: Supervision       General bed mobility comments: VCs to confirm technique and sequencing, patient performed x5 for reinforcement, tolerated well.  (reviewed metods for rolling in bed with demonstrations)  Transfers Overall transfer level: Needs assistance Equipment used: Rolling walker (2 wheeled) Transfers: Sit to/from Stand Sit to Stand: Supervision         General transfer comment: Cues for safety and technique   Ambulation/Gait Ambulation/Gait assistance: Supervision Ambulation Distance (Feet): 210 Feet Assistive device: Rolling walker (2 wheeled) Gait Pattern/deviations: Step-through  pattern;Decreased stride length;Antalgic Gait velocity: decreased Gait velocity interpretation: Below normal speed for age/gender General Gait Details: Continued cues for increased stride and cadence   Stairs Stairs: Yes Stairs assistance: Supervision Stair Management: Two rails;Step to pattern;Forwards Number of Stairs: 5 General stair comments: VCs for sequencing, not physical assist required  Wheelchair Mobility    Modified Rankin (Stroke Patients Only)       Balance     Sitting balance-Leahy Scale: Good       Standing balance-Leahy Scale: Fair                      Cognition Arousal/Alertness: Awake/alert Behavior During Therapy: WFL for tasks assessed/performed Overall Cognitive Status: Within Functional Limits for tasks assessed                      Exercises      General Comments General comments (skin integrity, edema, etc.): increased time to perform activities today secondary to increased pain      Pertinent Vitals/Pain Pain Assessment: 0-10 Pain Score: 9  Pain Location: back, left buttock and thigh Pain Descriptors / Indicators: Aching;Cramping;Spasm Pain Intervention(s): Limited activity within patient's tolerance;Monitored during session;Repositioned;Patient requesting pain meds-RN notified    Home Living                      Prior Function            PT Goals (current goals can now be found in the care plan section) Acute Rehab PT Goals Patient Stated Goal: to go home PT Goal Formulation: With patient Time For Goal Achievement: 01/27/15 Potential to Achieve Goals: Good Progress towards PT goals: Progressing toward goals    Frequency  Min 5X/week    PT  Plan Current plan remains appropriate    Co-evaluation             End of Session Equipment Utilized During Treatment: Gait belt Activity Tolerance: No increased pain Patient left:  (in gym with OT)     Time: 1583-0940 PT Time Calculation (min)  (ACUTE ONLY): 25 min  Charges:  $Gait Training: 8-22 mins $Therapeutic Activity: 8-22 mins                    G CodesDuncan Dull January 29, 2015, 8:50 AM Alben Deeds, PT DPT  [phone]

## 2015-01-17 NOTE — Progress Notes (Signed)
Occupational Therapy Treatment Patient Details Name: Bradley Espinoza MRN: [id number] DOB: Aug 19, 1956 Today's Date: 01/17/2015    History of present illness Patient is a 59 yo male s/p Lumbar one-Lumbar three Posterior lumbar fusion with pedicle screws with internal bone growth stimulator    OT comments  Patient progressing nicely towards OT goals, continue plan of care for now. Patient overall mod I > supervision for tasks. Patient continues to be limited by pain at times, but willing to work with therapy. Will continue to follow from OT standpoint during patient's acute LOS.    Follow Up Recommendations  No OT follow up;Supervision/Assistance - 24 hour    Equipment Recommendations  3 in 1 bedside comode    Recommendations for Other Services  None at this time   Precautions / Restrictions Precautions Precautions: Back Precaution Comments: Reviewed back precautions, patient able to verbally recall and adhere to 3/3 back precautions this session Required Braces or Orthoses: Spinal Brace Spinal Brace: Thoracolumbosacral orthotic Restrictions Weight Bearing Restrictions: No       Mobility Bed Mobility Overal bed mobility: Modified Independent  Transfers Overall transfer level: Needs assistance Equipment used: Rolling walker (2 wheeled) Transfers: Sit to/from Stand Sit to Stand: Supervision General transfer comment: Cues for safety, patient with good technique    Balance Overall balance assessment: Needs assistance Sitting-balance support: No upper extremity supported;Feet supported Sitting balance-Leahy Scale: Good     Standing balance support: Bilateral upper extremity supported;During functional activity Standing balance-Leahy Scale: Good   ADL General ADL Comments: Patient found asleep in bed. Patient willing to wake and work with therapist. Patient engaged in bed mobility at mod I level, adhereing to back precautions without any cueing required. Patient sat EOB  and was able to perform LB ADLs at mod I level. Patient donned TLSO with min assist for adjusting of right axillary strap. Patient stood with RW at supervision level and performed functional mobility around room. patient ambulated > BR for grooming tasks standing at sink. Educated patient on compensatory strategies to maintain back precautions during these tasks.      Cognition   Behavior During Therapy: WFL for tasks assessed/performed Overall Cognitive Status: Within Functional Limits for tasks assessed                 Pertinent Vitals/ Pain       Pain Assessment: 0-10 Pain Score: 8  Pain Location: back Pain Descriptors / Indicators: Aching Pain Intervention(s): Monitored during session;Repositioned         Frequency Min 2X/week     Progress Toward Goals  OT Goals(current goals can now be found in the care plan section)  Progress towards OT goals: Progressing toward goals     Plan Discharge plan remains appropriate       End of Session Equipment Utilized During Treatment: Rolling walker;Back brace   Activity Tolerance Patient tolerated treatment well   Patient Left in chair;with call bell/phone within reach     Time: 1442-1505 OT Time Calculation (min): 23 min  Charges: OT General Charges $OT Visit: 1 Procedure OT Treatments $Self Care/Home Management : 23-37 mins  Dajai Wahlert , MS, OTR/L, CLT Pager: [phone]  01/17/2015, 3:13 PM

## 2015-01-17 NOTE — Progress Notes (Signed)
Patient ID: Bradley Espinoza, male   DOB: [birth year], 59 y.o.   MRN: [id number] Stable, no weakness. C/o muscle spasms. Wound dry.

## 2015-01-18 NOTE — Discharge Instructions (Signed)
Spinal Fusion °Care After  °These instructions give you information on caring for yourself after your procedure. Your doctor may also give you more specific instructions. Call your doctor if you have any problems or questions after your procedure. °HOME CARE °· Only take medicines as told by your doctor. °· Do not drive if you are taking certain pain medicines (narcotics). °· Change your bandage (dressing) as told by your doctor. °· Do not get the wound wet. Do not take baths or swim until your doctor says it is okay. °· Check the wound often for redness, puffiness (swelling), or leaking fluids. °· Ask your doctor what activities you should avoid and for how long. °· Walk as much as you can.  Do not sit for long periods of time. Change positions every hour. °· Do not lift anything heavier than 5 to 10 pounds (2.25 to 4.5 kilograms) until your doctor says it is safe. °· Do not twist or bend for 6 weeks. Try not to pull on things. Do not hold things away from your body. °· Ask your doctor what exercises you should do. Exercises can help make the back stronger. °GET HELP RIGHT AWAY IF: °· Your pain suddenly gets worse. °· The wound is red, puffy, bleeding, or leaking fluid. °· Your legs or feet are painful, puffy, weak, or lose feeling (numb). °· You cannot control when you pee (urinate) or poop (bowel movement). °· You have trouble breathing. °· You have chest pain. °· You have a temperature by mouth above 102° F (38.9° C), not controlled by medicine. °MAKE SURE YOU: °· Understand these instructions. °· Will watch your condition. °· Will get help right away if you are not doing well or get worse. °Document Released: 01/18/2011 Document Revised: 12/17/2011 Document Reviewed: 01/18/2011 °Exit[employer]® Patient Information ©2015 [employer]. This information is not intended to replace advice given to you by your health care provider. Make sure you discuss any questions you have with your health care provider. ° °

## 2015-01-18 NOTE — Progress Notes (Signed)
Patient discharged home. RN discussed discharge instructions with patient. Patient vocalized understanding. Patient stated he did not want home health equipment recommended by therapy, stated if he needed it, he would get equipment from his sister. Discharge instructions given to patient.

## 2015-01-18 NOTE — Discharge Summary (Signed)
Physician Discharge Summary  Patient ID: TRAVES MAJCHRZAK MRN: [id number] DOB/AGE: [birth year] 59 y.o.  Admit date: 01/12/2015 Discharge date: 01/18/2015  Admission Diagnoses:fracture lumbar 2  Discharge Diagnoses:  Active Problems:   Fracture of L2 vertebra   Discharged Condition: no pain or weakness Hospital Course: surgery  Consults: rehab medicine  Significant Diagnostic Studies: mri  Treatments: l1 to l3 fusion  Discharge Exam: Blood pressure 132/69, pulse 72, temperature 97.6 F (36.4 C), temperature source Oral, resp. rate 18, height 6' (1.829 m), weight 80 kg (176 lb 5.9 oz), SpO2 100 %. On TLSO. ambulating  Disposition: home  To see me in 10 days     Medication List    ASK your doctor about these medications        ALLEGRA PO  Take 1 tablet by mouth daily as needed (allergies).     Azelastine-Fluticasone 137-50 MCG/ACT Susp  Place 1-2 sprays into the nose daily as needed (allergies).     BIOFREEZE 4 % Gel  Generic drug:  Menthol (Topical Analgesic)  Apply 1 application topically as needed (Pain).     calcium carbonate 600 MG Tabs tablet  Commonly known as:  OS-CAL  Take 600 mg by mouth daily.     diazepam 5 MG tablet  Commonly known as:  VALIUM  Take 1 tablet (5 mg total) by mouth every 6 (six) hours as needed. For muscle pain     diclofenac sodium 1 % Gel  Commonly known as:  VOLTAREN  Apply 1 application topically daily as needed (pain).     docusate sodium 100 MG capsule  Commonly known as:  COLACE  Take 100 mg by mouth daily.     ibuprofen 200 MG tablet  Commonly known as:  ADVIL,MOTRIN  Take 200 mg by mouth daily as needed for headache.     MULTIVITAL tablet  Take 1 tablet by mouth daily.     oxyCODONE 5 MG immediate release tablet  Commonly known as:  Oxy IR/ROXICODONE  Take 1-2 tablets (5-10 mg total) by mouth every 3 (three) hours as needed for moderate pain, severe pain or breakthrough pain.     OxyCODONE HCl ER 30 MG T12a   Commonly known as:  OXYCONTIN  Take one tablet by mouth every 12 hours for pain. Do not crush     oxyCODONE-acetaminophen 10-325 MG per tablet  Commonly known as:  PERCOCET  Take 1 tablet by mouth every 4 (four) hours as needed for pain.     polyethylene glycol packet  Commonly known as:  MIRALAX / GLYCOLAX  Take 17 g by mouth daily as needed (constipation).     pregabalin 75 MG capsule  Commonly known as:  LYRICA  Take 1 capsule (75 mg total) by mouth 2 (two) times daily.     ranitidine 150 MG tablet  Commonly known as:  ZANTAC  Take 150 mg by mouth 2 (two) times daily.     Testosterone 20.25 MG/1.25GM (1.62%) Gel  Place 1 application onto the skin daily.         Signed: Floyce Stakes 01/18/2015, 11:27 AM

## 2015-01-18 NOTE — Progress Notes (Signed)
Patient discharged home with sister and brother in law. RN educated patient and patient's family to not allow patient to drive while patient is taking narcotics. Patient's family vocalized understanding, patient showed no signs of learning.

## 2015-01-18 NOTE — Progress Notes (Signed)
Physical Therapy Treatment Patient Details Name: Bradley Espinoza MRN: [id number] DOB: November 12, 1955 Today's Date: 01/18/2015    History of Present Illness Patient is a 59 yo male s/p Lumbar one-Lumbar three Posterior lumbar fusion with pedicle screws with internal bone growth stimulator     PT Comments    Patient with improved pain tolerance today. Patient ambulated extended distance without difficulty, 3 standing rest breaks. Overall mobilizing well, improved compliance with precautions. At this time, anticipate patient will not heed HHPT, recommend initial supervision upon discharge.    Follow Up Recommendations  Supervision/Assistance - 24 hour (initially)     Equipment Recommendations  None recommended by PT    Recommendations for Other Services       Precautions / Restrictions Precautions Precautions: Back Precaution Booklet Issued: Yes (comment) Precaution Comments: Reviewed back precautions (Verballay recalled 3/3 precautions this session) Required Braces or Orthoses: Spinal Brace Spinal Brace: Thoracolumbosacral orthotic Restrictions Weight Bearing Restrictions: No    Mobility  Bed Mobility Overal bed mobility: Modified Independent                Transfers Overall transfer level: Modified independent Equipment used: Rolling walker (2 wheeled) Transfers: Sit to/from Stand Sit to Stand: Modified independent (Device/Increase time)            Ambulation/Gait Ambulation/Gait assistance: Supervision Ambulation Distance (Feet): 310 Feet Assistive device: Rolling walker (2 wheeled) Gait Pattern/deviations: Step-through pattern;Decreased stride length;Antalgic Gait velocity: decreased Gait velocity interpretation: Below normal speed for age/gender General Gait Details: VCs for upright posture   Stairs         General stair comments: VCs for sequencing, not physical assist required  Wheelchair Mobility    Modified Rankin (Stroke Patients  Only)       Balance     Sitting balance-Leahy Scale: Good       Standing balance-Leahy Scale: Fair                      Cognition Arousal/Alertness: Awake/alert Behavior During Therapy: WFL for tasks assessed/performed Overall Cognitive Status: Within Functional Limits for tasks assessed                      Exercises      General Comments        Pertinent Vitals/Pain Pain Assessment: 0-10 Pain Score: 6  Pain Location: back Pain Descriptors / Indicators: Aching Pain Intervention(s): Monitored during session;Repositioned    Home Living                      Prior Function            PT Goals (current goals can now be found in the care plan section) Acute Rehab PT Goals Patient Stated Goal: to go home PT Goal Formulation: With patient Time For Goal Achievement: 01/27/15 Potential to Achieve Goals: Good Progress towards PT goals: Progressing toward goals    Frequency  Min 5X/week    PT Plan Current plan remains appropriate    Co-evaluation             End of Session Equipment Utilized During Treatment: Gait belt Activity Tolerance: No increased pain Patient left:  (in gym with OT)     Time: 4097-3532 PT Time Calculation (min) (ACUTE ONLY): 21 min  Charges:  $Gait Training: 8-22 mins                    G Codes:  Duncan Dull 01/18/2015, 11:57 AM Alben Deeds, PT DPT  [phone]

## 2015-03-08 ENCOUNTER — Other Ambulatory Visit: Payer: Self-pay | Admitting: *Deleted

## 2015-03-08 NOTE — Patient Outreach (Signed)
Attempt #1 made to contact [employer] member, f/u on high cost claim, assess if any needs.   HIPPA compliant voice message left with contact number.   Will try again.    Zara Chess.   [place] [facility]  [phone]

## 2015-03-09 ENCOUNTER — Other Ambulatory Visit: Payer: Self-pay | Admitting: *Deleted

## 2015-03-09 NOTE — Patient Outreach (Signed)
Second attempt made to contact [employer] member, f/u on high cost claim referral.   HIPPA compliant voice message left with contact number.   Plan to try a third attempt.    Zara Chess.   [place] [facility]  [phone]

## 2015-03-22 ENCOUNTER — Other Ambulatory Visit: Payer: Self-pay | Admitting: *Deleted

## 2015-03-22 NOTE — Patient Outreach (Addendum)
Third attempt made to contact [place] member, discuss [facility] services, assess if case management needs.  HIPPA compliant voice message left with contact number.  With  this being the third attempt, if no response- plan to close case.  Zara Chess.   [place] [facility]  [phone]

## 2015-05-03 ENCOUNTER — Other Ambulatory Visit (HOSPITAL_COMMUNITY): Payer: Self-pay | Admitting: Orthopedic Surgery

## 2015-05-06 ENCOUNTER — Encounter (HOSPITAL_COMMUNITY): Payer: Self-pay

## 2015-05-06 ENCOUNTER — Encounter (HOSPITAL_COMMUNITY)
Admission: RE | Admit: 2015-05-06 | Discharge: 2015-05-06 | Disposition: A | Discharge status: Home / Self Care | Payer: 59 | Source: Ambulatory Visit | Attending: Orthopedic Surgery | Admitting: Orthopedic Surgery

## 2015-05-06 DIAGNOSIS — Z01812 Encounter for preprocedural laboratory examination: Secondary | ICD-10-CM | POA: Insufficient documentation

## 2015-05-06 DIAGNOSIS — M12571 Traumatic arthropathy, right ankle and foot: Secondary | ICD-10-CM | POA: Insufficient documentation

## 2015-05-06 HISTORY — DX: Personal history of urinary calculi: Z87.442

## 2015-05-06 HISTORY — DX: Presence of spectacles and contact lenses: Z97.3

## 2015-05-06 HISTORY — DX: Personal history of other diseases of the digestive system: Z87.19

## 2015-05-06 HISTORY — DX: Personal history of peptic ulcer disease: Z87.11

## 2015-05-06 LAB — CBC
HEMATOCRIT: 46.7 % (ref 39.0–52.0)
HEMOGLOBIN: 15.7 g/dL (ref 13.0–17.0)
MCH: 31.4 pg (ref 26.0–34.0)
MCHC: 33.6 g/dL (ref 30.0–36.0)
MCV: 93.4 fL (ref 78.0–100.0)
Platelets: 186 10*3/uL (ref 150–400)
RBC: 5 MIL/uL (ref 4.22–5.81)
RDW: 13.1 % (ref 11.5–15.5)
WBC: 7.7 10*3/uL (ref 4.0–10.5)

## 2015-05-06 LAB — COMPREHENSIVE METABOLIC PANEL
ALBUMIN: 4.1 g/dL (ref 3.5–5.0)
ALK PHOS: 89 U/L (ref 38–126)
ALT: 14 U/L — ABNORMAL LOW (ref 17–63)
ANION GAP: 8 (ref 5–15)
AST: 16 U/L (ref 15–41)
BUN: 16 mg/dL (ref 6–20)
CHLORIDE: 103 mmol/L (ref 101–111)
CO2: 28 mmol/L (ref 22–32)
CREATININE: 0.77 mg/dL (ref 0.61–1.24)
Calcium: 9.3 mg/dL (ref 8.9–10.3)
GFR calc Af Amer: >60 mL/min (ref >60)
GFR calc non Af Amer: >60 mL/min (ref >60)
Glucose, Bld: 95 mg/dL (ref 65–99)
POTASSIUM: 4.2 mmol/L (ref 3.5–5.1)
Sodium: 139 mmol/L (ref 135–145)
Total Bilirubin: 0.7 mg/dL (ref 0.3–1.2)
Total Protein: 7.1 g/dL (ref 6.5–8.1)

## 2015-05-06 LAB — APTT: aPTT: 28 seconds (ref 24–37)

## 2015-05-06 LAB — PROTIME-INR
INR: 0.99 (ref 0.00–1.49)
Prothrombin Time: 13.3 seconds (ref 11.6–15.2)

## 2015-05-06 LAB — SURGICAL PCR SCREEN
MRSA, PCR: POSITIVE — AB
Staphylococcus aureus: POSITIVE — AB

## 2015-05-06 MED ORDER — CHLORHEXIDINE GLUCONATE 4 % EX LIQD: 60.0000 mL | Freq: Once | CUTANEOUS | Status: DC

## 2015-05-06 NOTE — Progress Notes (Signed)
PCR positive.  Patient notified and verbalized understanding.  Prescription called into [facility] in [place].

## 2015-05-06 NOTE — Progress Notes (Signed)
PCP- Dr. Nevada Crane Cardiologist - denies EKG/CXR - denies Echo/Stress test/Card. Cath - denies

## 2015-05-06 NOTE — Pre-Procedure Instructions (Signed)
    Bradley Espinoza  05/06/2015      [place], [place] [address] [place] [address] Phone: [phone] Fax: [phone]    Your procedure is scheduled on Wednesday, August 3rd, 2016 at 9:50 AM.  Report to [facility] Admitting at 7:50 A.M.  Call this number if you have problems the morning of surgery:  [phone]   Remember:  Do not eat food or drink liquids after midnight.   Take these medicines the morning of surgery with A SIP OF WATER:  Diazepam (Valium) if needed, Fexofenadine (Allegra), OxyCodone HCL (Oxycontin) if needed,  OxyCodone-Acetaminophen (Percocet) if needed, Ranitidine (Zantac).  Stop taking: Voltaren Gel, NSAIDS, Aspirin, Ibuprofen, Aleve, Naproxen, BC's, Goody's, Motrin, Advil, Fish Oil, all herbal medications, and all vitamins.    Do not wear jewelry.  Do not wear lotions, powders, or colognes.  You may wear deodorant.  Men may shave face and neck.  Do not bring valuables to the hospital.  [facility] is not responsible for any belongings or valuables.  Contacts, dentures or bridgework may not be worn into surgery.  Leave your suitcase in the car.  After surgery it may be brought to your room.  For patients admitted to the hospital, discharge time will be determined by your treatment team.  Patients discharged the day of surgery will not be allowed to drive home.   Special instructions:  See attached.   Please read over the following fact sheets that you were given. Pain Booklet, Coughing and Deep Breathing, MRSA Information and Surgical Site Infection Prevention

## 2015-05-11 ENCOUNTER — Ambulatory Visit (HOSPITAL_COMMUNITY)
Admission: RE | Admit: 2015-05-11 | Discharge: 2015-05-11 | Disposition: A | Discharge status: Home / Self Care | Payer: 59 | Source: Ambulatory Visit | Attending: Orthopedic Surgery | Admitting: Orthopedic Surgery

## 2015-05-11 ENCOUNTER — Ambulatory Visit (HOSPITAL_COMMUNITY): Payer: 59 | Admitting: Anesthesiology

## 2015-05-11 ENCOUNTER — Encounter (HOSPITAL_COMMUNITY)
Admission: RE | Disposition: A | Discharge status: Home / Self Care | Payer: Self-pay | Source: Ambulatory Visit | Attending: Orthopedic Surgery

## 2015-05-11 ENCOUNTER — Encounter (HOSPITAL_COMMUNITY): Payer: Self-pay | Admitting: *Deleted

## 2015-05-11 DIAGNOSIS — Z87891 Personal history of nicotine dependence: Secondary | ICD-10-CM | POA: Diagnosis not present

## 2015-05-11 DIAGNOSIS — M12571 Traumatic arthropathy, right ankle and foot: Secondary | ICD-10-CM | POA: Insufficient documentation

## 2015-05-11 HISTORY — PX: ANKLE FUSION: SHX881

## 2015-05-11 SURGERY — ARTHRODESIS ANKLE
Anesthesia: General | Site: Foot | Laterality: Right

## 2015-05-11 MED ORDER — ONDANSETRON HCL 4 MG/2ML IJ SOLN: 4.0000 mg | Freq: Four times a day (QID) | INTRAMUSCULAR | Status: DC | PRN

## 2015-05-11 MED ORDER — HYDROMORPHONE HCL 1 MG/ML IJ SOLN
INTRAMUSCULAR | Status: AC
  Filled 2015-05-11: qty 2

## 2015-05-11 MED ORDER — FENTANYL CITRATE (PF) 100 MCG/2ML IJ SOLN
INTRAMUSCULAR | Status: AC
  Filled 2015-05-11: qty 2

## 2015-05-11 MED ORDER — SODIUM CHLORIDE 0.9 % IR SOLN
Status: DC | PRN
  Administered 2015-05-11: 6000 mL

## 2015-05-11 MED ORDER — LIDOCAINE HCL (CARDIAC) 20 MG/ML IV SOLN
INTRAVENOUS | Status: DC | PRN
  Administered 2015-05-11: 60 mg via INTRAVENOUS

## 2015-05-11 MED ORDER — PROPOFOL 10 MG/ML IV BOLUS
INTRAVENOUS | Status: AC
  Filled 2015-05-11: qty 20

## 2015-05-11 MED ORDER — SUCCINYLCHOLINE CHLORIDE 20 MG/ML IJ SOLN
INTRAMUSCULAR | Status: DC | PRN
  Administered 2015-05-11: 100 mg via INTRAVENOUS

## 2015-05-11 MED ORDER — MIDAZOLAM HCL 5 MG/5ML IJ SOLN
INTRAMUSCULAR | Status: DC | PRN
  Administered 2015-05-11: 2 mg via INTRAVENOUS

## 2015-05-11 MED ORDER — OXYCODONE HCL 5 MG/5ML PO SOLN: 5.0000 mg | Freq: Once | ORAL | Status: AC | PRN

## 2015-05-11 MED ORDER — HYDROMORPHONE HCL 1 MG/ML IJ SOLN
0.2500 mg | INTRAMUSCULAR | Status: DC | PRN
  Administered 2015-05-11 (×4): 0.5 mg via INTRAVENOUS

## 2015-05-11 MED ORDER — CEFAZOLIN SODIUM-DEXTROSE 2-3 GM-% IV SOLR
2.0000 g | INTRAVENOUS | Status: AC
  Administered 2015-05-11: 2 g via INTRAVENOUS

## 2015-05-11 MED ORDER — LIDOCAINE HCL (CARDIAC) 20 MG/ML IV SOLN
INTRAVENOUS | Status: AC
  Filled 2015-05-11: qty 5

## 2015-05-11 MED ORDER — ONDANSETRON HCL 4 MG/2ML IJ SOLN
INTRAMUSCULAR | Status: AC
  Filled 2015-05-11: qty 2

## 2015-05-11 MED ORDER — ONDANSETRON HCL 4 MG/2ML IJ SOLN
INTRAMUSCULAR | Status: DC | PRN
  Administered 2015-05-11: 4 mg via INTRAVENOUS

## 2015-05-11 MED ORDER — MIDAZOLAM HCL 2 MG/2ML IJ SOLN
INTRAMUSCULAR | Status: AC
  Filled 2015-05-11: qty 4

## 2015-05-11 MED ORDER — LACTATED RINGERS IV SOLN
INTRAVENOUS | Status: DC
  Administered 2015-05-11: 08:00:00 via INTRAVENOUS

## 2015-05-11 MED ORDER — FENTANYL CITRATE (PF) 250 MCG/5ML IJ SOLN
INTRAMUSCULAR | Status: AC
  Filled 2015-05-11: qty 5

## 2015-05-11 MED ORDER — OXYCODONE HCL 5 MG PO TABS
5.0000 mg | ORAL_TABLET | Freq: Once | ORAL | Status: AC | PRN
  Administered 2015-05-11: 5 mg via ORAL

## 2015-05-11 MED ORDER — FENTANYL CITRATE (PF) 100 MCG/2ML IJ SOLN
INTRAMUSCULAR | Status: DC | PRN
  Administered 2015-05-11 (×2): 100 ug via INTRAVENOUS
  Administered 2015-05-11: 50 ug via INTRAVENOUS

## 2015-05-11 MED ORDER — OXYCODONE HCL 5 MG PO TABS
ORAL_TABLET | ORAL | Status: AC
  Filled 2015-05-11: qty 1

## 2015-05-11 MED ORDER — PROPOFOL 10 MG/ML IV BOLUS
INTRAVENOUS | Status: DC | PRN
  Administered 2015-05-11: 200 mg via INTRAVENOUS

## 2015-05-11 MED ORDER — 0.9 % SODIUM CHLORIDE (POUR BTL) OPTIME
TOPICAL | Status: DC | PRN
  Administered 2015-05-11: 1000 mL

## 2015-05-11 MED ORDER — HYDROMORPHONE HCL 1 MG/ML IJ SOLN
0.5000 mg | INTRAMUSCULAR | Status: AC | PRN
  Administered 2015-05-11 (×4): 0.5 mg via INTRAVENOUS

## 2015-05-11 MED ORDER — ROCURONIUM BROMIDE 50 MG/5ML IV SOLN
INTRAVENOUS | Status: AC
  Filled 2015-05-11: qty 1

## 2015-05-11 MED ORDER — MIDAZOLAM HCL 2 MG/2ML IJ SOLN
INTRAMUSCULAR | Status: AC
  Filled 2015-05-11: qty 2

## 2015-05-11 MED ORDER — CEFAZOLIN SODIUM-DEXTROSE 2-3 GM-% IV SOLR
INTRAVENOUS | Status: AC
  Filled 2015-05-11: qty 50

## 2015-05-11 SURGICAL SUPPLY — 54 items
BANDAGE ESMARK 6X9 LF (GAUZE/BANDAGES/DRESSINGS) ×1 IMPLANT
BIT DRILL CANN LRG QC 5X300 (BIT) ×3 IMPLANT
BLADE SAW SGTL 83.5X18.5 (BLADE) ×3 IMPLANT
BLADE SURG 10 STRL SS (BLADE) IMPLANT
BNDG COHESIVE 4X5 TAN STRL (GAUZE/BANDAGES/DRESSINGS) ×3 IMPLANT
BNDG COHESIVE 6X5 TAN STRL LF (GAUZE/BANDAGES/DRESSINGS) ×3 IMPLANT
BNDG ESMARK 6X9 LF (GAUZE/BANDAGES/DRESSINGS) ×3
BNDG GAUZE ELAST 4 BULKY (GAUZE/BANDAGES/DRESSINGS) ×3 IMPLANT
COTTON STERILE ROLL (GAUZE/BANDAGES/DRESSINGS) IMPLANT
COVER MAYO STAND STRL (DRAPES) IMPLANT
COVER SURGICAL LIGHT HANDLE (MISCELLANEOUS) ×6 IMPLANT
CUFF TOURNIQUET SINGLE 34IN LL (TOURNIQUET CUFF) IMPLANT
CUFF TOURNIQUET SINGLE 44IN (TOURNIQUET CUFF) IMPLANT
DRAPE INCISE IOBAN 66X45 STRL (DRAPES) IMPLANT
DRAPE OEC MINIVIEW 54X84 (DRAPES) ×3 IMPLANT
DRAPE PROXIMA HALF (DRAPES) ×3 IMPLANT
DRAPE STERI 35X30 U-POUCH (DRAPES) ×3 IMPLANT
DRAPE U-SHAPE 47X51 STRL (DRAPES) ×3 IMPLANT
DRSG ADAPTIC 3X8 NADH LF (GAUZE/BANDAGES/DRESSINGS) ×3 IMPLANT
DRSG PAD ABDOMINAL 8X10 ST (GAUZE/BANDAGES/DRESSINGS) ×3 IMPLANT
DURAPREP 26ML APPLICATOR (WOUND CARE) ×3 IMPLANT
ELECT REM PT RETURN 9FT ADLT (ELECTROSURGICAL) ×3
ELECTRODE REM PT RTRN 9FT ADLT (ELECTROSURGICAL) ×1 IMPLANT
GAUZE SPONGE 4X4 12PLY STRL (GAUZE/BANDAGES/DRESSINGS) ×3 IMPLANT
GLOVE BIOGEL PI IND STRL 9 (GLOVE) ×1 IMPLANT
GLOVE BIOGEL PI INDICATOR 9 (GLOVE) ×2
GLOVE SURG ORTHO 9.0 STRL STRW (GLOVE) ×3 IMPLANT
GOWN STRL REUS W/ TWL XL LVL3 (GOWN DISPOSABLE) ×3 IMPLANT
GOWN STRL REUS W/TWL XL LVL3 (GOWN DISPOSABLE) ×6
GUIDEWIRE THREADED 2.8 (WIRE) ×6 IMPLANT
KIT BASIN OR (CUSTOM PROCEDURE TRAY) ×3 IMPLANT
KIT ROOM TURNOVER OR (KITS) ×3 IMPLANT
MANIFOLD NEPTUNE II (INSTRUMENTS) ×3 IMPLANT
NEEDLE SPNL 18GX3.5 QUINCKE PK (NEEDLE) ×3 IMPLANT
NS IRRIG 1000ML POUR BTL (IV SOLUTION) ×3 IMPLANT
PACK ORTHO EXTREMITY (CUSTOM PROCEDURE TRAY) ×3 IMPLANT
PAD ARMBOARD 7.5X6 YLW CONV (MISCELLANEOUS) ×6 IMPLANT
PAD CAST 4YDX4 CTTN HI CHSV (CAST SUPPLIES) IMPLANT
PADDING CAST COTTON 4X4 STRL (CAST SUPPLIES)
SCREW HEADLESS 6.5X80X16MM (Screw) ×3 IMPLANT
SCREW HL THREAD 6.5X65 (Screw) ×3 IMPLANT
SET ARTHROSCOPY TUBING (MISCELLANEOUS) ×2
SET ARTHROSCOPY TUBING LN (MISCELLANEOUS) ×1 IMPLANT
SPONGE LAP 18X18 X RAY DECT (DISPOSABLE) ×3 IMPLANT
STAPLER VISISTAT 35W (STAPLE) IMPLANT
SUCTION FRAZIER TIP 10 FR DISP (SUCTIONS) ×3 IMPLANT
SUT ETHILON 2 0 PSLX (SUTURE) ×9 IMPLANT
SUT VIC AB 2-0 CTB1 (SUTURE) IMPLANT
TOWEL OR 17X24 6PK STRL BLUE (TOWEL DISPOSABLE) ×3 IMPLANT
TOWEL OR 17X26 10 PK STRL BLUE (TOWEL DISPOSABLE) ×3 IMPLANT
TUBE CONNECTING 12'X1/4 (SUCTIONS) ×1
TUBE CONNECTING 12X1/4 (SUCTIONS) ×2 IMPLANT
WAND HAND CNTRL MULTIVAC 90 (MISCELLANEOUS) ×3 IMPLANT
WATER STERILE IRR 1000ML POUR (IV SOLUTION) ×3 IMPLANT

## 2015-05-11 NOTE — Progress Notes (Signed)
Orthopedic Tech Progress Note Patient Details:  Bradley Espinoza Feb 01, 1956 [id number]  Ortho Devices Type of Ortho Device: CAM walker Ortho Device/Splint Location: rle Ortho Device/Splint Interventions: Application   [name] 05/11/2015, 12:03 PM

## 2015-05-11 NOTE — Transfer of Care (Signed)
Immediate Anesthesia Transfer of Care Note  Patient: KHALIQ TURAY  Procedure(s) Performed: Procedure(s): Right Posterior Arthroscopic Subtalar Arthrodesis (Right)  Patient Location: PACU  Anesthesia Type:General  Level of Consciousness: awake, alert  and oriented  Airway & Oxygen Therapy: Patient Spontanous Breathing and Patient connected to nasal cannula oxygen  Post-op Assessment: Report given to RN, Post -op Vital signs reviewed and stable and Patient moving all extremities X 4  Post vital signs: Reviewed and stable  Last Vitals:  Filed Vitals:   05/11/15 0808  BP: 142/84  Pulse: 66  Temp: 36.9 C  Resp: 18    Complications: No apparent anesthesia complications

## 2015-05-11 NOTE — Op Note (Signed)
05/11/2015  10:37 AM  PATIENT:  Bradley Espinoza    PRE-OPERATIVE DIAGNOSIS:  Subtalar Traumatic Arthritis Right Foot  POST-OPERATIVE DIAGNOSIS:  Same  PROCEDURE:  Right Posterior Arthroscopic Subtalar Arthrodesis C-arm fluoroscopy  SURGEON:  Newt Minion, MD  PHYSICIAN ASSISTANT:None ANESTHESIA:   General  PREOPERATIVE INDICATIONS:  [name] is a  59 y.o. male with a diagnosis of Subtalar Traumatic Arthritis Right Foot who failed conservative measures and elected for surgical management.    The risks benefits and alternatives were discussed with the patient preoperatively including but not limited to the risks of infection, bleeding, nerve injury, cardiopulmonary complications, the need for revision surgery, among others, and the patient was willing to proceed.  OPERATIVE IMPLANTS: 6.5 cannulated headless screws 2  OPERATIVE FINDINGS: Osteoporotic soft bone  OPERATIVE PROCEDURE: Patient is a 59 year old gentleman status post open calcaneal fracture which initially went irrigation and debridement sesamoid underwent open reduction internal fixation and he did develop an infection. The hardware was removed. Patient subsequently developed traumatic arthritis and presents at this time for arthroscopic subtalar fusion. Risks and benefits were discussed including increased risk of infection. Patient states he understands wishes to proceed at this time.  Patient was brought to the operating room and underwent a general anesthesia aesthetic. After adequate levels anesthesia were obtained patient was placed in the prone position and the right lower extremity was prepped using DuraPrep draped into a sterile field. A timeout was called. 2 arthroscopic portals were placed posterior medial and posterior lateral to the Achilles. Using the shaver and the electrocautery wand the fibrinous tissue was debrided down to the subtalar joint. Using the electric bur shaver and curette the subtalar  joint was debrided of fibrinous tissue and articular cartilage. C-arm floss be verified location. After debridement of the subtalar joint enhancements were removed 2 pins were placed from the calcaneus into the talus C-arm fluoroscopy verified alignment and 2 cannulated compression screws were placed across the posterior facet of the subtalar joint. C-arm fluoroscopy verified alignment. The incisions were closed using 2-0 nylon. A sterile compressive dressing was applied. Patient was extubated taken to the PACU in stable condition plan for discharge to home.

## 2015-05-11 NOTE — Anesthesia Procedure Notes (Signed)
Procedure Name: Intubation Date/Time: 05/11/2015 9:29 AM Performed by: Kyung Rudd Pre-anesthesia Checklist: Patient identified, Emergency Drugs available, Suction available, Patient being monitored and Timeout performed Patient Re-evaluated:Patient Re-evaluated prior to inductionOxygen Delivery Method: Circle system utilized Preoxygenation: Pre-oxygenation with 100% oxygen Intubation Type: IV induction Ventilation: Mask ventilation without difficulty Laryngoscope Size: Mac and 3 Grade View: Grade II Tube type: Oral Tube size: 7.5 mm Number of attempts: 1 Airway Equipment and Method: Stylet Placement Confirmation: ETT inserted through vocal cords under direct vision,  positive ETCO2 and breath sounds checked- equal and bilateral Secured at: 21 cm Tube secured with: Tape Dental Injury: Teeth and Oropharynx as per pre-operative assessment

## 2015-05-11 NOTE — Anesthesia Preprocedure Evaluation (Addendum)
Anesthesia Evaluation  Patient identified by MRN, date of birth, ID band Patient awake    Reviewed: Allergy & Precautions, NPO status , Patient's Chart, lab work & pertinent test results  Airway Mallampati: II       Dental  (+) Partial Upper   Pulmonary former smoker,  breath sounds clear to auscultation        Cardiovascular negative cardio ROS  Rhythm:regular Rate:Normal     Neuro/Psych  Headaches, Anxiety Depression    GI/Hepatic GERD-  ,  Endo/Other    Renal/GU      Musculoskeletal  (+) Arthritis -,   Abdominal   Peds  Hematology   Anesthesia Other Findings   Reproductive/Obstetrics                          Anesthesia Physical Anesthesia Plan  ASA: II  Anesthesia Plan: General   Post-op Pain Management:    Induction: Intravenous  Airway Management Planned: Oral ETT  Additional Equipment:   Intra-op Plan:   Post-operative Plan: Extubation in OR  Informed Consent: I have reviewed the patients History and Physical, chart, labs and discussed the procedure including the risks, benefits and alternatives for the proposed anesthesia with the patient or authorized representative who has indicated his/her understanding and acceptance.   Dental advisory given  Plan Discussed with: CRNA and Anesthesiologist  Anesthesia Plan Comments:         Anesthesia Quick Evaluation

## 2015-05-11 NOTE — H&P (Signed)
Bradley Espinoza is an 59 y.o. male.   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 He initially underwent irrigation and debridement and subsequently underwent open reduction internal fixation patient did develop infection and underwent removal the deep retained hardware and placement of a wound VAC. Patient presents at this time which are myotic arthritis of the subtalar joint and presents for fusion of the joint.  Past Medical History  Diagnosis Date  . Anxiety   . Fracture     B/L ankles  . Headache(784.0)     allergy related   . Depression   . GERD (gastroesophageal reflux disease)   . Arthritis     low back pain, lumbar radiculopathy  . History of stomach ulcers   . Wears glasses   . History of kidney stones     Past Surgical History  Procedure Laterality Date  . Cervical spine surgery  2008  . I&d extremity Right 09/15/2014    Procedure: IRRIGATION AND DEBRIDEMENT Ankle;  Surgeon: Renette Butters, MD;  Location: [place];  Service: Orthopedics;  Laterality: Right;  . Orif calcaneous fracture Right 09/19/2014    Procedure: OPEN REDUCTION INTERNAL FIXATION (ORIF) CALCANEOUS FRACTURE;  Surgeon: Newt Minion, MD;  Location: [place];  Service: Orthopedics;  Laterality: Right;  . Orif calcaneous fracture Left 09/19/2014    Procedure: OPEN REDUCTION INTERNAL FIXATION (ORIF) CALCANEOUS FRACTURE;  Surgeon: Newt Minion, MD;  Location: [place];  Service: Orthopedics;  Laterality: Left;  . Hardware removal Right 10/09/2014    Procedure: Removal Deep Hardware, Irrigation and Debridement Calcaneus, Place Antibiotic Beads and Wound VAC ;  Surgeon: Newt Minion, MD;  Location: [place];  Service: Orthopedics;  Laterality: Right;  . Inguinal hernia repair Bilateral   . Back surgery  2004    x 2  . Esophagogastroduodenoscopy      Family History  Problem Relation Age  of Onset  . Diabetes Father   . Cancer Other    Social History:  reports that he quit smoking about 8 months ago. His smoking use included Cigarettes and Cigars. He started smoking about 38 years ago. He has a 10 pack-year smoking history. He has never used smokeless tobacco. He reports that he does not drink alcohol or use illicit drugs.  Allergies: No Known Allergies  No prescriptions prior to admission    No results found for this or any previous visit (from the past 48 hour(s)). No results found.  ROS  There were no vitals taken for this visit. Physical Exam  On examination there is no active sign of infection. Radiograph shows traumatic arthritis of the subtalar joint. Assessment/Plan Assessment: Traumatic arthritis subtalar joint right foot status post open fracture status post open reduction internal fixation and status post removal of deep retained hardware for infection.  Plan: We'll plan for a fusion of the subtalar joint. Due to the patient's previous complications with the open wound and the internal fixation we'll plan for posterior arthroscopic subtalar arthrodesis to minimize risk of infection minimize risk of wound complications.  [name] V 05/11/2015, 6:27 AM

## 2015-05-11 NOTE — Progress Notes (Signed)
Patient reports that he has some money with him. Offered to lock money up he refused.

## 2015-05-12 ENCOUNTER — Encounter (HOSPITAL_COMMUNITY): Payer: Self-pay | Admitting: Orthopedic Surgery

## 2015-05-13 NOTE — Anesthesia Postprocedure Evaluation (Signed)
Anesthesia Post Note  Patient: Bradley Espinoza  Procedure(s) Performed: Procedure(s) (LRB): Right Posterior Arthroscopic Subtalar Arthrodesis (Right)  Anesthesia type: General  Patient location: PACU  Post pain: Pain level controlled and Adequate analgesia  Post assessment: Post-op Vital signs reviewed, Patient's Cardiovascular Status Stable, Respiratory Function Stable, Patent Airway and Pain level controlled  Last Vitals:  Filed Vitals:   05/11/15 1210  BP:   Pulse: 59  Temp: 36.7 C  Resp: 15    Post vital signs: Reviewed and stable  Level of consciousness: awake, alert  and oriented  Complications: No apparent anesthesia complications

## 2015-08-04 ENCOUNTER — Other Ambulatory Visit (HOSPITAL_COMMUNITY): Payer: Self-pay | Admitting: Orthopedic Surgery

## 2015-08-10 ENCOUNTER — Encounter (HOSPITAL_COMMUNITY): Payer: Self-pay | Admitting: *Deleted

## 2015-08-10 NOTE — Progress Notes (Signed)
Pt denies SOB, chest pain, and being under the care of a cardiologist. Pt denies having a stress test, echo and cardiac cath. Pt denies having an EKG within the last year. Pt made aware to stop  taking Aspirin, otc vitamins ( multi, C and E) fish oil and herbal medications. Do not take any NSAIDs ie: Ibuprofen, Advil, Naproxen or any medication containing Aspirin such as Voltaren. Pt verbalized understanding of all pre-op instructions.

## 2015-08-11 MED ORDER — CHLORHEXIDINE GLUCONATE 4 % EX LIQD: 60.0000 mL | Freq: Once | CUTANEOUS | Status: DC

## 2015-08-11 MED ORDER — CEFAZOLIN SODIUM-DEXTROSE 2-3 GM-% IV SOLR
2.0000 g | INTRAVENOUS | Status: AC
  Administered 2015-08-12: 2 g via INTRAVENOUS

## 2015-08-12 ENCOUNTER — Ambulatory Visit (HOSPITAL_COMMUNITY): Payer: 59 | Admitting: Anesthesiology

## 2015-08-12 ENCOUNTER — Encounter (HOSPITAL_COMMUNITY)
Admission: RE | Disposition: A | Discharge status: Home / Self Care | Payer: Self-pay | Source: Ambulatory Visit | Attending: Orthopedic Surgery

## 2015-08-12 ENCOUNTER — Encounter (HOSPITAL_COMMUNITY): Payer: Self-pay | Admitting: Surgery

## 2015-08-12 ENCOUNTER — Ambulatory Visit (HOSPITAL_COMMUNITY)
Admission: RE | Admit: 2015-08-12 | Discharge: 2015-08-12 | Disposition: A | Discharge status: Home / Self Care | Payer: 59 | Source: Ambulatory Visit | Attending: Orthopedic Surgery | Admitting: Orthopedic Surgery

## 2015-08-12 DIAGNOSIS — Y838 Other surgical procedures as the cause of abnormal reaction of the patient, or of later complication, without mention of misadventure at the time of the procedure: Secondary | ICD-10-CM | POA: Diagnosis not present

## 2015-08-12 DIAGNOSIS — T8484XS Pain due to internal orthopedic prosthetic devices, implants and grafts, sequela: Secondary | ICD-10-CM

## 2015-08-12 DIAGNOSIS — Z87891 Personal history of nicotine dependence: Secondary | ICD-10-CM | POA: Insufficient documentation

## 2015-08-12 DIAGNOSIS — T8484XA Pain due to internal orthopedic prosthetic devices, implants and grafts, initial encounter: Secondary | ICD-10-CM | POA: Diagnosis present

## 2015-08-12 HISTORY — DX: Presence of functional implant, unspecified: Z96.9

## 2015-08-12 HISTORY — PX: HARDWARE REMOVAL: SHX979

## 2015-08-12 LAB — COMPREHENSIVE METABOLIC PANEL
ALT: 21 U/L (ref 17–63)
AST: 21 U/L (ref 15–41)
Albumin: 4.3 g/dL (ref 3.5–5.0)
Alkaline Phosphatase: 82 U/L (ref 38–126)
Anion gap: 6 (ref 5–15)
BUN: 18 mg/dL (ref 6–20)
CHLORIDE: 107 mmol/L (ref 101–111)
CO2: 26 mmol/L (ref 22–32)
CREATININE: 0.84 mg/dL (ref 0.61–1.24)
Calcium: 9.4 mg/dL (ref 8.9–10.3)
Glucose, Bld: 102 mg/dL — ABNORMAL HIGH (ref 65–99)
Potassium: 4 mmol/L (ref 3.5–5.1)
Sodium: 139 mmol/L (ref 135–145)
TOTAL PROTEIN: 7.3 g/dL (ref 6.5–8.1)
Total Bilirubin: 0.9 mg/dL (ref 0.3–1.2)

## 2015-08-12 LAB — CBC
HCT: 44.9 % (ref 39.0–52.0)
Hemoglobin: 15.1 g/dL (ref 13.0–17.0)
MCH: 31.7 pg (ref 26.0–34.0)
MCHC: 33.6 g/dL (ref 30.0–36.0)
MCV: 94.1 fL (ref 78.0–100.0)
PLATELETS: 182 10*3/uL (ref 150–400)
RBC: 4.77 MIL/uL (ref 4.22–5.81)
RDW: 13 % (ref 11.5–15.5)
WBC: 6.8 10*3/uL (ref 4.0–10.5)

## 2015-08-12 LAB — APTT: aPTT: 27 seconds (ref 24–37)

## 2015-08-12 LAB — SURGICAL PCR SCREEN
MRSA, PCR: POSITIVE — AB
Staphylococcus aureus: POSITIVE — AB

## 2015-08-12 LAB — PROTIME-INR
INR: 1.03 (ref 0.00–1.49)
PROTHROMBIN TIME: 13.7 s (ref 11.6–15.2)

## 2015-08-12 SURGERY — REMOVAL, HARDWARE
Anesthesia: General | Site: Ankle | Laterality: Right

## 2015-08-12 MED ORDER — FENTANYL CITRATE (PF) 100 MCG/2ML IJ SOLN
INTRAMUSCULAR | Status: AC
  Filled 2015-08-12: qty 2

## 2015-08-12 MED ORDER — LIDOCAINE HCL (CARDIAC) 20 MG/ML IV SOLN
INTRAVENOUS | Status: AC
  Filled 2015-08-12: qty 5

## 2015-08-12 MED ORDER — MIDAZOLAM HCL 2 MG/2ML IJ SOLN
INTRAMUSCULAR | Status: AC
  Filled 2015-08-12: qty 2

## 2015-08-12 MED ORDER — 0.9 % SODIUM CHLORIDE (POUR BTL) OPTIME
TOPICAL | Status: DC | PRN
  Administered 2015-08-12: 1000 mL

## 2015-08-12 MED ORDER — FENTANYL CITRATE (PF) 100 MCG/2ML IJ SOLN
INTRAMUSCULAR | Status: DC | PRN
  Administered 2015-08-12: 100 ug via INTRAVENOUS

## 2015-08-12 MED ORDER — CEFAZOLIN SODIUM-DEXTROSE 2-3 GM-% IV SOLR
INTRAVENOUS | Status: AC
  Filled 2015-08-12: qty 50

## 2015-08-12 MED ORDER — PROPOFOL 10 MG/ML IV BOLUS
INTRAVENOUS | Status: DC | PRN
  Administered 2015-08-12: 200 mg via INTRAVENOUS

## 2015-08-12 MED ORDER — PROMETHAZINE HCL 25 MG/ML IJ SOLN: 6.2500 mg | INTRAMUSCULAR | Status: DC | PRN

## 2015-08-12 MED ORDER — OXYCODONE-ACETAMINOPHEN 5-325 MG PO TABS
1.0000 | ORAL_TABLET | Freq: Once | ORAL | Status: AC
  Administered 2015-08-12: 1 via ORAL
  Filled 2015-08-12: qty 1

## 2015-08-12 MED ORDER — LACTATED RINGERS IV SOLN
INTRAVENOUS | Status: DC
  Administered 2015-08-12: 12:00:00 via INTRAVENOUS

## 2015-08-12 MED ORDER — OXYCODONE-ACETAMINOPHEN 5-325 MG PO TABS
ORAL_TABLET | ORAL | Status: AC
  Administered 2015-08-12: 2
  Filled 2015-08-12: qty 1

## 2015-08-12 MED ORDER — FENTANYL CITRATE (PF) 250 MCG/5ML IJ SOLN
INTRAMUSCULAR | Status: AC
  Filled 2015-08-12: qty 5

## 2015-08-12 MED ORDER — MUPIROCIN 2 % EX OINT: 1.0000 "application " | TOPICAL_OINTMENT | Freq: Once | CUTANEOUS | Status: DC

## 2015-08-12 MED ORDER — MUPIROCIN 2 % EX OINT
TOPICAL_OINTMENT | CUTANEOUS | Status: AC
  Filled 2015-08-12: qty 22

## 2015-08-12 MED ORDER — PROPOFOL 10 MG/ML IV BOLUS
INTRAVENOUS | Status: AC
  Filled 2015-08-12: qty 20

## 2015-08-12 MED ORDER — HYDROMORPHONE HCL 1 MG/ML IJ SOLN
INTRAMUSCULAR | Status: AC
  Filled 2015-08-12: qty 1

## 2015-08-12 MED ORDER — FENTANYL CITRATE (PF) 100 MCG/2ML IJ SOLN
25.0000 ug | INTRAMUSCULAR | Status: DC
  Administered 2015-08-12: 50 ug via INTRAVENOUS

## 2015-08-12 MED ORDER — OXYCODONE-ACETAMINOPHEN 5-325 MG PO TABS
ORAL_TABLET | ORAL | Status: AC
  Filled 2015-08-12: qty 2

## 2015-08-12 MED ORDER — MIDAZOLAM HCL 5 MG/5ML IJ SOLN
INTRAMUSCULAR | Status: DC | PRN
  Administered 2015-08-12: 2 mg via INTRAVENOUS

## 2015-08-12 MED ORDER — MIDAZOLAM HCL 2 MG/2ML IJ SOLN: 0.5000 mg | Freq: Once | INTRAMUSCULAR | Status: DC | PRN

## 2015-08-12 MED ORDER — ONDANSETRON HCL 4 MG/2ML IJ SOLN
INTRAMUSCULAR | Status: AC
  Filled 2015-08-12: qty 2

## 2015-08-12 MED ORDER — HYDROMORPHONE HCL 1 MG/ML IJ SOLN
0.2500 mg | INTRAMUSCULAR | Status: DC | PRN
  Administered 2015-08-12 (×4): 0.5 mg via INTRAVENOUS

## 2015-08-12 MED ORDER — MEPERIDINE HCL 25 MG/ML IJ SOLN: 6.2500 mg | INTRAMUSCULAR | Status: DC | PRN

## 2015-08-12 SURGICAL SUPPLY — 43 items
BANDAGE ESMARK 6X9 LF (GAUZE/BANDAGES/DRESSINGS) IMPLANT
BNDG COHESIVE 4X5 TAN STRL (GAUZE/BANDAGES/DRESSINGS) IMPLANT
BNDG COHESIVE 6X5 TAN STRL LF (GAUZE/BANDAGES/DRESSINGS) ×3 IMPLANT
BNDG ESMARK 6X9 LF (GAUZE/BANDAGES/DRESSINGS)
BNDG GAUZE ELAST 4 BULKY (GAUZE/BANDAGES/DRESSINGS) ×3 IMPLANT
COVER SURGICAL LIGHT HANDLE (MISCELLANEOUS) ×6 IMPLANT
CUFF TOURNIQUET SINGLE 34IN LL (TOURNIQUET CUFF) IMPLANT
CUFF TOURNIQUET SINGLE 44IN (TOURNIQUET CUFF) IMPLANT
DRAPE C-ARM 42X72 X-RAY (DRAPES) IMPLANT
DRAPE INCISE IOBAN 66X45 STRL (DRAPES) IMPLANT
DRAPE ORTHO SPLIT 77X108 STRL (DRAPES)
DRAPE SURG ORHT 6 SPLT 77X108 (DRAPES) IMPLANT
DRAPE U-SHAPE 47X51 STRL (DRAPES) ×3 IMPLANT
DRSG EMULSION OIL 3X3 NADH (GAUZE/BANDAGES/DRESSINGS) ×3 IMPLANT
DRSG PAD ABDOMINAL 8X10 ST (GAUZE/BANDAGES/DRESSINGS) ×3 IMPLANT
DURAPREP 26ML APPLICATOR (WOUND CARE) ×3 IMPLANT
ELECT REM PT RETURN 9FT ADLT (ELECTROSURGICAL) ×3
ELECTRODE REM PT RTRN 9FT ADLT (ELECTROSURGICAL) ×1 IMPLANT
GAUZE SPONGE 4X4 12PLY STRL (GAUZE/BANDAGES/DRESSINGS) ×3 IMPLANT
GLOVE BIOGEL PI IND STRL 9 (GLOVE) ×1 IMPLANT
GLOVE BIOGEL PI INDICATOR 9 (GLOVE) ×2
GLOVE SURG ORTHO 9.0 STRL STRW (GLOVE) ×3 IMPLANT
GOWN STRL REUS W/ TWL XL LVL3 (GOWN DISPOSABLE) ×3 IMPLANT
GOWN STRL REUS W/TWL XL LVL3 (GOWN DISPOSABLE) ×6
KIT BASIN OR (CUSTOM PROCEDURE TRAY) ×3 IMPLANT
KIT ROOM TURNOVER OR (KITS) ×3 IMPLANT
MANIFOLD NEPTUNE II (INSTRUMENTS) ×3 IMPLANT
NS IRRIG 1000ML POUR BTL (IV SOLUTION) ×3 IMPLANT
PACK ORTHO EXTREMITY (CUSTOM PROCEDURE TRAY) ×3 IMPLANT
PAD ARMBOARD 7.5X6 YLW CONV (MISCELLANEOUS) ×6 IMPLANT
SPONGE GAUZE 4X4 12PLY STER LF (GAUZE/BANDAGES/DRESSINGS) ×3 IMPLANT
SPONGE LAP 18X18 X RAY DECT (DISPOSABLE) IMPLANT
STAPLER VISISTAT 35W (STAPLE) IMPLANT
STOCKINETTE IMPERVIOUS 9X36 MD (GAUZE/BANDAGES/DRESSINGS) IMPLANT
SUT ETHILON 2 0 PSLX (SUTURE) IMPLANT
SUT VIC AB 0 CT1 27 (SUTURE)
SUT VIC AB 0 CT1 27XBRD ANBCTR (SUTURE) IMPLANT
SUT VIC AB 2-0 CT1 27 (SUTURE)
SUT VIC AB 2-0 CT1 TAPERPNT 27 (SUTURE) IMPLANT
TOWEL OR 17X24 6PK STRL BLUE (TOWEL DISPOSABLE) ×3 IMPLANT
TOWEL OR 17X26 10 PK STRL BLUE (TOWEL DISPOSABLE) ×3 IMPLANT
UNDERPAD 30X30 INCONTINENT (UNDERPADS AND DIAPERS) ×3 IMPLANT
WATER STERILE IRR 1000ML POUR (IV SOLUTION) ×3 IMPLANT

## 2015-08-12 NOTE — Progress Notes (Signed)
Wasted fentanyl 50 mcg in sharps witness by D.Loma Sousa, RN

## 2015-08-12 NOTE — H&P (Signed)
Bradley Espinoza is an 59 y.o. male.   Chief Complaint: Painful hardware right calcaneus HPI: Patient is a 59 year old gentleman status post subtalar fusion from an open calcaneal fracture. Patient complains of pain from his hardware at this time and presents for removal deep retained hardware.  Past Medical History  Diagnosis Date  . Anxiety   . Fracture     B/L ankles  . Headache(784.0)     allergy related   . Depression   . GERD (gastroesophageal reflux disease)   . Arthritis     low back pain, lumbar radiculopathy  . History of stomach ulcers   . Wears glasses   . History of kidney stones   . Retained orthopedic hardware     failed retained hardware right foot    Past Surgical History  Procedure Laterality Date  . Cervical spine surgery  2008  . I&d extremity Right 09/15/2014    Procedure: IRRIGATION AND DEBRIDEMENT Ankle;  Surgeon: Renette Butters, MD;  Location: [place];  Service: Orthopedics;  Laterality: Right;  . Orif calcaneous fracture Right 09/19/2014    Procedure: OPEN REDUCTION INTERNAL FIXATION (ORIF) CALCANEOUS FRACTURE;  Surgeon: Newt Minion, MD;  Location: [place];  Service: Orthopedics;  Laterality: Right;  . Orif calcaneous fracture Left 09/19/2014    Procedure: OPEN REDUCTION INTERNAL FIXATION (ORIF) CALCANEOUS FRACTURE;  Surgeon: Newt Minion, MD;  Location: [place];  Service: Orthopedics;  Laterality: Left;  . Hardware removal Right 10/09/2014    Procedure: Removal Deep Hardware, Irrigation and Debridement Calcaneus, Place Antibiotic Beads and Wound VAC ;  Surgeon: Newt Minion, MD;  Location: [place];  Service: Orthopedics;  Laterality: Right;  . Inguinal hernia repair Bilateral   . Back surgery  2004    x 2  . Esophagogastroduodenoscopy    . Ankle fusion Right 05/11/2015    Procedure: Right Posterior Arthroscopic Subtalar Arthrodesis;  Surgeon: Newt Minion, MD;  Location: [place];  Service: Orthopedics;  Laterality: Right;    Family History  Problem  Relation Age of Onset  . Diabetes Father   . Cancer Other    Social History:  reports that he quit smoking about 11 months ago. His smoking use included Cigarettes and Cigars. He started smoking about 38 years ago. He has a 10 pack-year smoking history. He has never used smokeless tobacco. He reports that he does not drink alcohol or use illicit drugs.  Allergies: No Known Allergies  No prescriptions prior to admission    No results found for this or any previous visit (from the past 48 hour(s)). No results found.  Review of Systems  All other systems reviewed and are negative.   There were no vitals taken for this visit. Physical Exam  On examination patient has pain to palpation over the calcaneus. Radiographs shows prominent hardware. Assessment/Plan Assessment: Painful deep retained hardware right calcaneus.  Plan: We'll plan for removal of deep retained hardware. Risk and benefits were discussed including persistent pain and infection need for additional surgery patient states she understands wish to proceed at this time.  DUDA,MARCUS V 08/12/2015, 6:29 AM

## 2015-08-12 NOTE — Anesthesia Procedure Notes (Addendum)
Procedure Name: LMA Insertion Date/Time: 08/12/2015 2:12 PM Performed by: Salli Quarry Tore Carreker Pre-anesthesia Checklist: Patient identified, Emergency Drugs available, Suction available and Patient being monitored Patient Re-evaluated:Patient Re-evaluated prior to inductionOxygen Delivery Method: Circle system utilized Preoxygenation: Pre-oxygenation with 100% oxygen Intubation Type: IV induction LMA: LMA inserted LMA Size: 4.0 Number of attempts: 1 Placement Confirmation: positive ETCO2 and breath sounds checked- equal and bilateral Tube secured with: Tape

## 2015-08-12 NOTE — Transfer of Care (Signed)
Immediate Anesthesia Transfer of Care Note  Patient: Bradley Espinoza  Procedure(s) Performed: Procedure(s): Removal Deep Hardware Right Foot (Right)  Patient Location: PACU  Anesthesia Type:General  Level of Consciousness: lethargic and responds to stimulation  Airway & Oxygen Therapy: Patient Spontanous Breathing and Patient connected to nasal cannula oxygen  Post-op Assessment: Report given to RN  Post vital signs: Reviewed and stable  Last Vitals:  Filed Vitals:   08/12/15 1135  BP: 158/93  Pulse: 63  Temp: 37.2 C  Resp: 18    Complications: No apparent anesthesia complications

## 2015-08-12 NOTE — Anesthesia Preprocedure Evaluation (Addendum)
Anesthesia Evaluation  Patient identified by MRN, date of birth, ID band Patient awake    Reviewed: Allergy & Precautions, NPO status , Patient's Chart, lab work & pertinent test results  History of Anesthesia Complications Negative for: history of anesthetic complications  Airway Mallampati: II  TM Distance: >3 FB Neck ROM: Full    Dental  (+) Partial Upper, Missing, Dental Advisory Given, Caps   Pulmonary Current Smoker,    breath sounds clear to auscultation       Cardiovascular negative cardio ROS   Rhythm:Regular Rate:Normal     Neuro/Psych  Headaches, Anxiety Depression Chronic back pain    GI/Hepatic Neg liver ROS, GERD  Medicated and Controlled,  Endo/Other  negative endocrine ROS  Renal/GU negative Renal ROS     Musculoskeletal  (+) Arthritis , Osteoarthritis,    Abdominal   Peds  Hematology negative hematology ROS (+)   Anesthesia Other Findings   Reproductive/Obstetrics                            Anesthesia Physical Anesthesia Plan  ASA: II  Anesthesia Plan: General   Post-op Pain Management:    Induction: Intravenous  Airway Management Planned: LMA  Additional Equipment:   Intra-op Plan:   Post-operative Plan:   Informed Consent: I have reviewed the patients History and Physical, chart, labs and discussed the procedure including the risks, benefits and alternatives for the proposed anesthesia with the patient or authorized representative who has indicated his/her understanding and acceptance.   Dental advisory given  Plan Discussed with: CRNA and Surgeon  Anesthesia Plan Comments: (Plan routine monitors, GA- LMA OK)        Anesthesia Quick Evaluation

## 2015-08-12 NOTE — Op Note (Signed)
08/12/2015  2:30 PM  PATIENT:  Bradley Espinoza    PRE-OPERATIVE DIAGNOSIS:  Failed Retained Hardware Right Foot  POST-OPERATIVE DIAGNOSIS:  Same  PROCEDURE:  Removal Deep Hardware Right Foot  SURGEON:  Newt Minion, MD  PHYSICIAN ASSISTANT:None ANESTHESIA:   General  PREOPERATIVE INDICATIONS:  Bradley Espinoza is a  59 y.o. male with a diagnosis of Failed Retained Hardware Right Foot who failed conservative measures and elected for surgical management.    The risks benefits and alternatives were discussed with the patient preoperatively including but not limited to the risks of infection, bleeding, nerve injury, cardiopulmonary complications, the need for revision surgery, among others, and the patient was willing to proceed.  OPERATIVE IMPLANTS: none  OPERATIVE FINDINGS: Hardware removed intact  OPERATIVE PROCEDURE: Patient was brought to the operating room and underwent a general anesthetic. After adequate levels anesthesia obtained patient's right lower extremity was prepped using DuraPrep draped into a sterile field. A timeout was called. Incision was made posteriorly over the calcaneus. Blunt dissection was carried down to the hardware guidewire was inserted down the 2 deep retained screws and the screws were removed over the guidewire without complications. The wound was irrigated normal saline incision was closed using 2-0 nylon sterile compressive dressing was applied. Patient was extubated taken to the PACU in stable condition plan for discharge to home.

## 2015-08-12 NOTE — Anesthesia Postprocedure Evaluation (Signed)
  Anesthesia Post-op Note  Patient: Bradley Espinoza  Procedure(s) Performed: Procedure(s): Removal Deep Hardware Right Foot (Right)  Patient Location: PACU  Anesthesia Type:General  Level of Consciousness: awake and alert   Airway and Oxygen Therapy: Patient Spontanous Breathing  Post-op Pain: Controlled  Post-op Assessment: Post-op Vital signs reviewed, Patient's Cardiovascular Status Stable and Respiratory Function Stable  Post-op Vital Signs: Reviewed  Filed Vitals:   08/12/15 1515  BP: 146/96  Pulse: 55  Temp:   Resp: 12    Complications: No apparent anesthesia complications

## 2015-08-15 ENCOUNTER — Encounter (HOSPITAL_COMMUNITY): Payer: Self-pay | Admitting: Orthopedic Surgery

## 2015-10-20 DIAGNOSIS — T84116S Breakdown (mechanical) of internal fixation device of bone of right lower leg, sequela: Secondary | ICD-10-CM | POA: Diagnosis not present

## 2015-10-20 DIAGNOSIS — M19071 Primary osteoarthritis, right ankle and foot: Secondary | ICD-10-CM | POA: Diagnosis not present

## 2015-10-20 DIAGNOSIS — T84116D Breakdown (mechanical) of internal fixation device of bone of right lower leg, subsequent encounter: Secondary | ICD-10-CM | POA: Diagnosis not present

## 2015-10-26 DIAGNOSIS — Z79891 Long term (current) use of opiate analgesic: Secondary | ICD-10-CM | POA: Diagnosis not present

## 2015-10-26 DIAGNOSIS — G894 Chronic pain syndrome: Secondary | ICD-10-CM | POA: Diagnosis not present

## 2015-10-26 DIAGNOSIS — S32009S Unspecified fracture of unspecified lumbar vertebra, sequela: Secondary | ICD-10-CM | POA: Diagnosis not present

## 2015-10-26 DIAGNOSIS — S32008S Other fracture of unspecified lumbar vertebra, sequela: Secondary | ICD-10-CM | POA: Diagnosis not present

## 2015-10-26 DIAGNOSIS — M79671 Pain in right foot: Secondary | ICD-10-CM | POA: Diagnosis not present

## 2015-10-30 IMAGING — CT CT FOOT*R* W/O CM
4 of 8 series · 14 of 33 positions shown, 15 images · non-contrast
Comparison: Radiographs, same date.

CLINICAL DATA: EVALUATE BILATERAL CALCANEUS FRACTURES.

EXAM:
CT OF THE RIGHT/ANKLE FOOT WITHOUT CONTRAST. CT OF THE LEFT
FOOT/ANKLE WITHOUT CONTRAST.
TECHNIQUE: Multidetector CT imaging of the right foot was performed according
to the standard protocol. Multiplanar CT image reconstructions were
also generated.

[Series 8: thins st left · axial · 0.38mm/px · z∈[-1806,-1674]mm · 4 of 221 slices shown, 5 images (1 of 2)]
[im 45/221  soft-tissue]
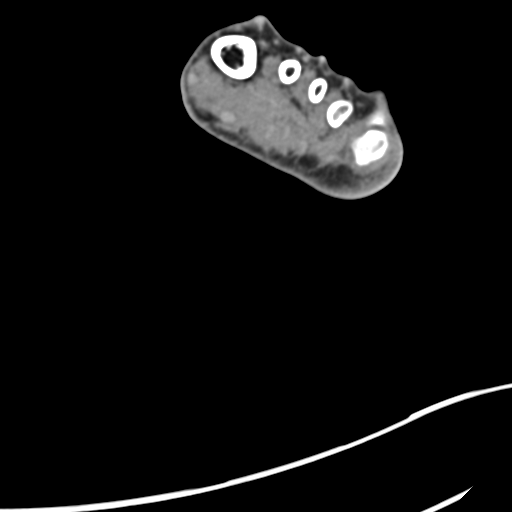
[im 45/221  bone]
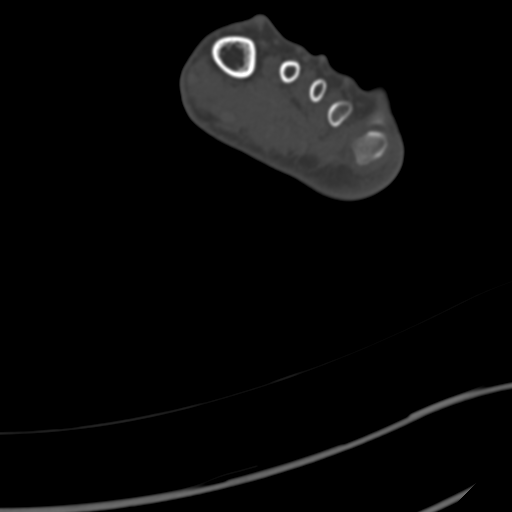
[im 89/221  bone]
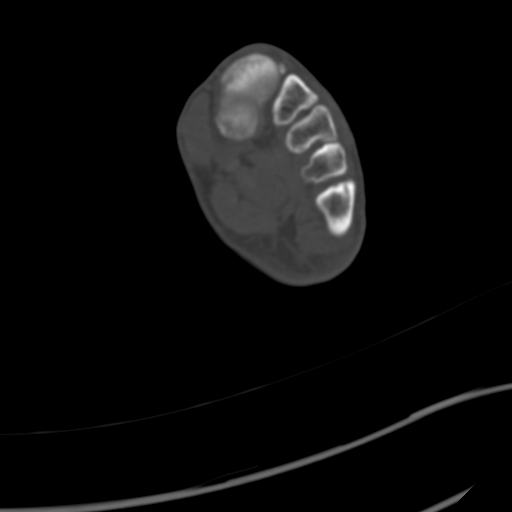
[im 133/221  bone]
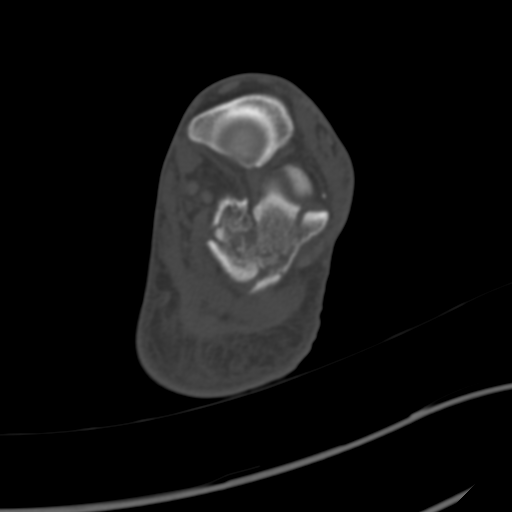
[im 177/221  bone]
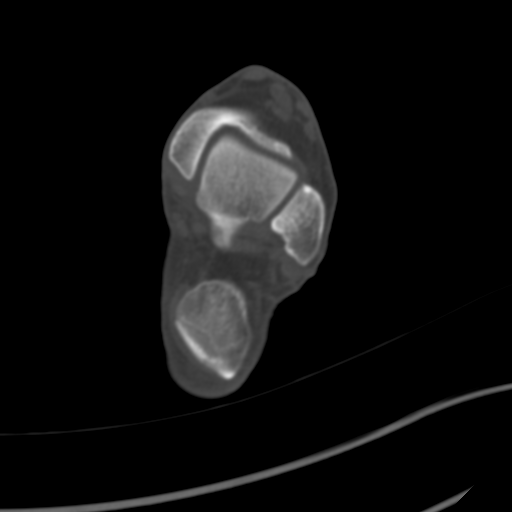

[Series 9: thins st left · axial · 0.44mm/px · z∈[-1806,-1674]mm · 4 of 220 slices shown (2 of 2)]
[im 44/220  bone]
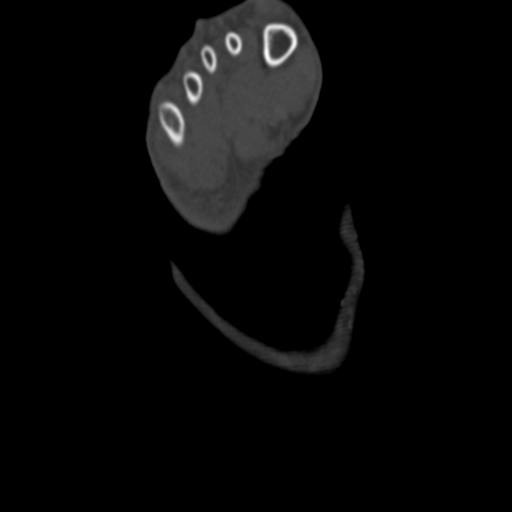
[im 88/220  bone]
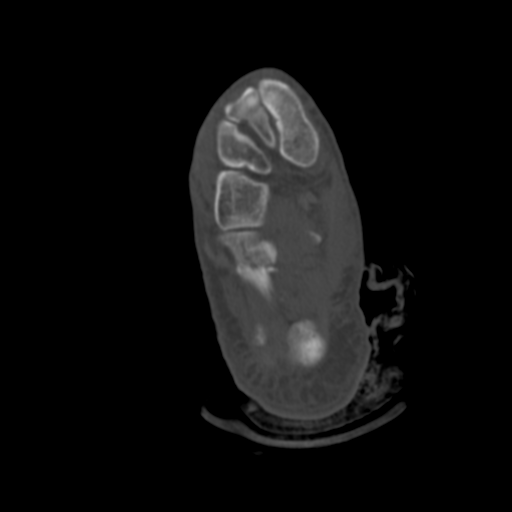
[im 132/220  bone]
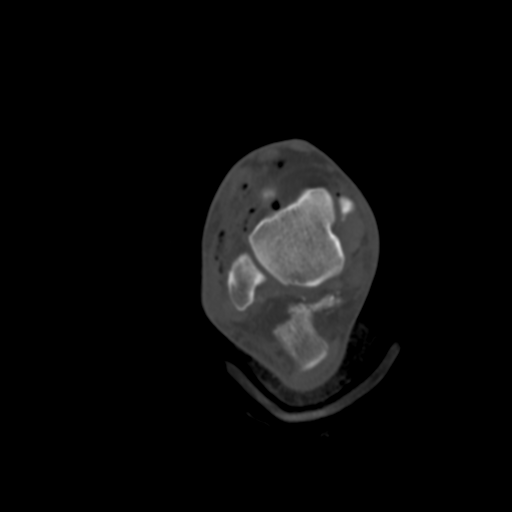
[im 176/220  bone]
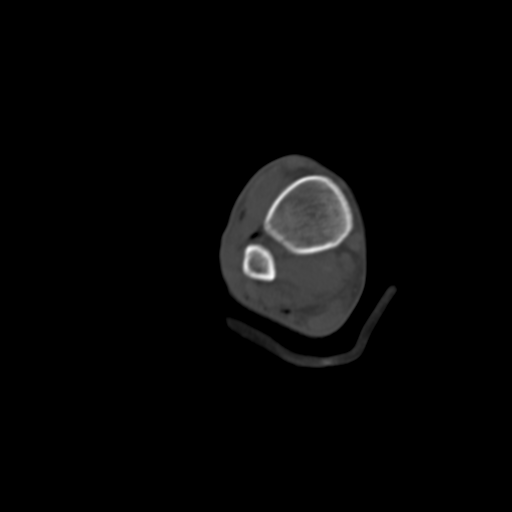

[Series 605: coronal left s.t. · coronal · 0.43mm/px · 1 of 78 slices shown]
[im 39/78  bone]
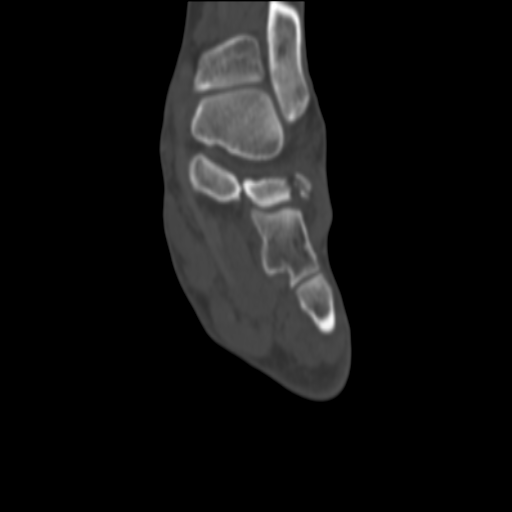

[Series 607: sagittal left s.t. · sagittal · 0.44mm/px · 5 of 52 slices shown]
[im 9/52  bone]
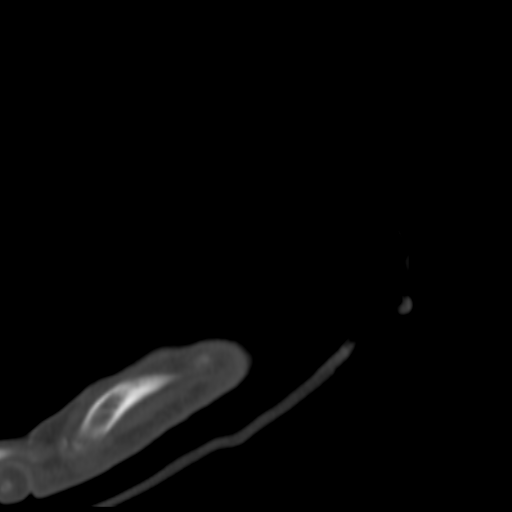
[im 18/52  bone]
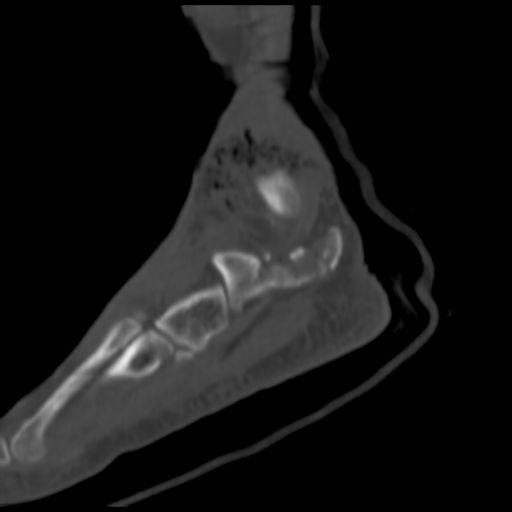
[im 26/52  bone]
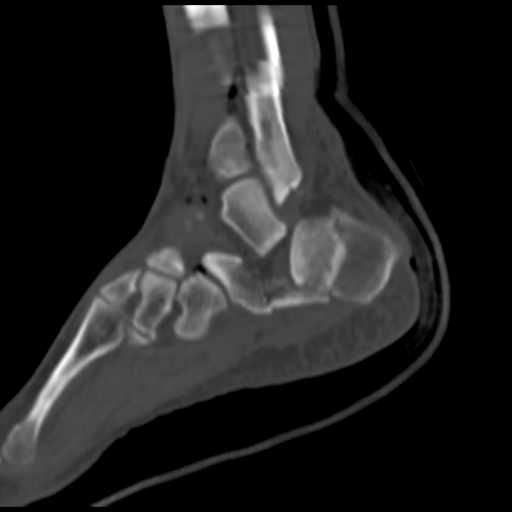
[im 35/52  bone]
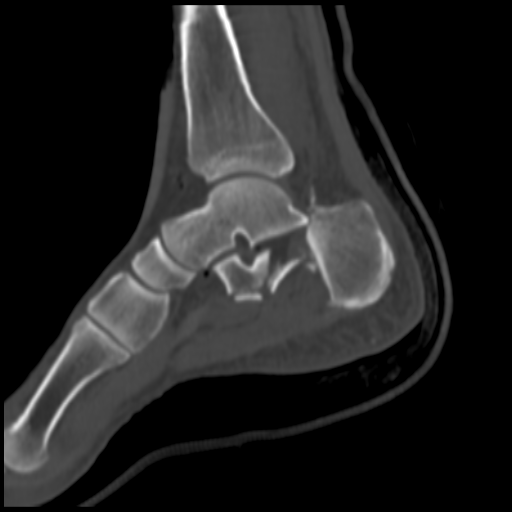
[im 43/52  bone]
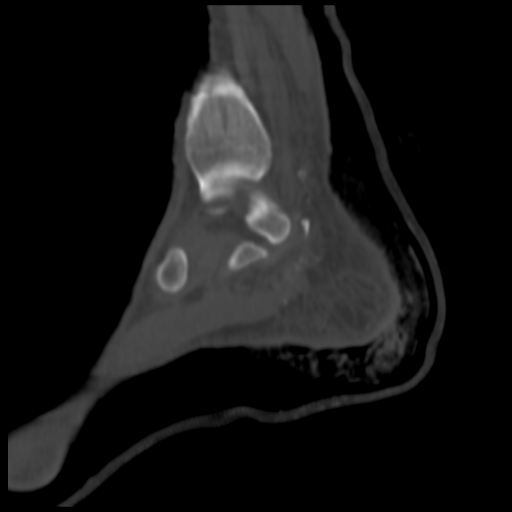

[14 of 33 positions shown; findings below may reference images not displayed]

FINDINGS: Left ankle/foot:

Comminuted and displaced calcaneal fractures are demonstrated. This
is largely a central depression type fracture. The medial aspects of
the posterior and middle facets are intact and the joint spaces are
maintained. Laterally the posterior facet is fractured and
depressed. There are also non articular fractures involving the
posterior aspect of the calcaneus with a posterior V shaped vertical
split extending out through both the medial and lateral cortices.

There is a small fracture involving the anterior process of the
calcaneus.

The tibiotalar joint is maintained. No talar fracture. The cuboid is
intact.

Right ankle/foot:

Severely comminuted and depressed central depression type calcaneus
fracture. This is much more comminuted and displaced when compared
to left-sided fracture. There are numerous small bone fragments. The
medial aspect of the Racquel Tzul is intact. The entire posterior facet
is depressed, rotated and comminuted.

There is air noted in the soft tissues suggesting an open injury.
The talus is intact. The tibiotalar joint is maintained. The cuboid
is intact.
IMPRESSION: Left ankle/foot: Complex comminuted and displaced calcaneal
fractures as described above.

Right ankle/foot: Complex severely comminuted and displaced
calcaneal fractures as described above.

## 2015-10-31 DIAGNOSIS — M542 Cervicalgia: Secondary | ICD-10-CM | POA: Diagnosis not present

## 2015-10-31 DIAGNOSIS — S32001A Stable burst fracture of unspecified lumbar vertebra, initial encounter for closed fracture: Secondary | ICD-10-CM | POA: Diagnosis not present

## 2015-11-06 IMAGING — DX DG LUMBAR SPINE 2-3V
2 series · 2 of 2 positions shown · non-contrast
Comparison: 09/15/2014

CLINICAL DATA: Evaluate L2 fracture - pt fell 40ft from ladder 1
week ago - also broke both feet as well - past hx of lower back
surgery

EXAM:
LUMBAR SPINE - 2-3 VIEW

[l-spine ap]
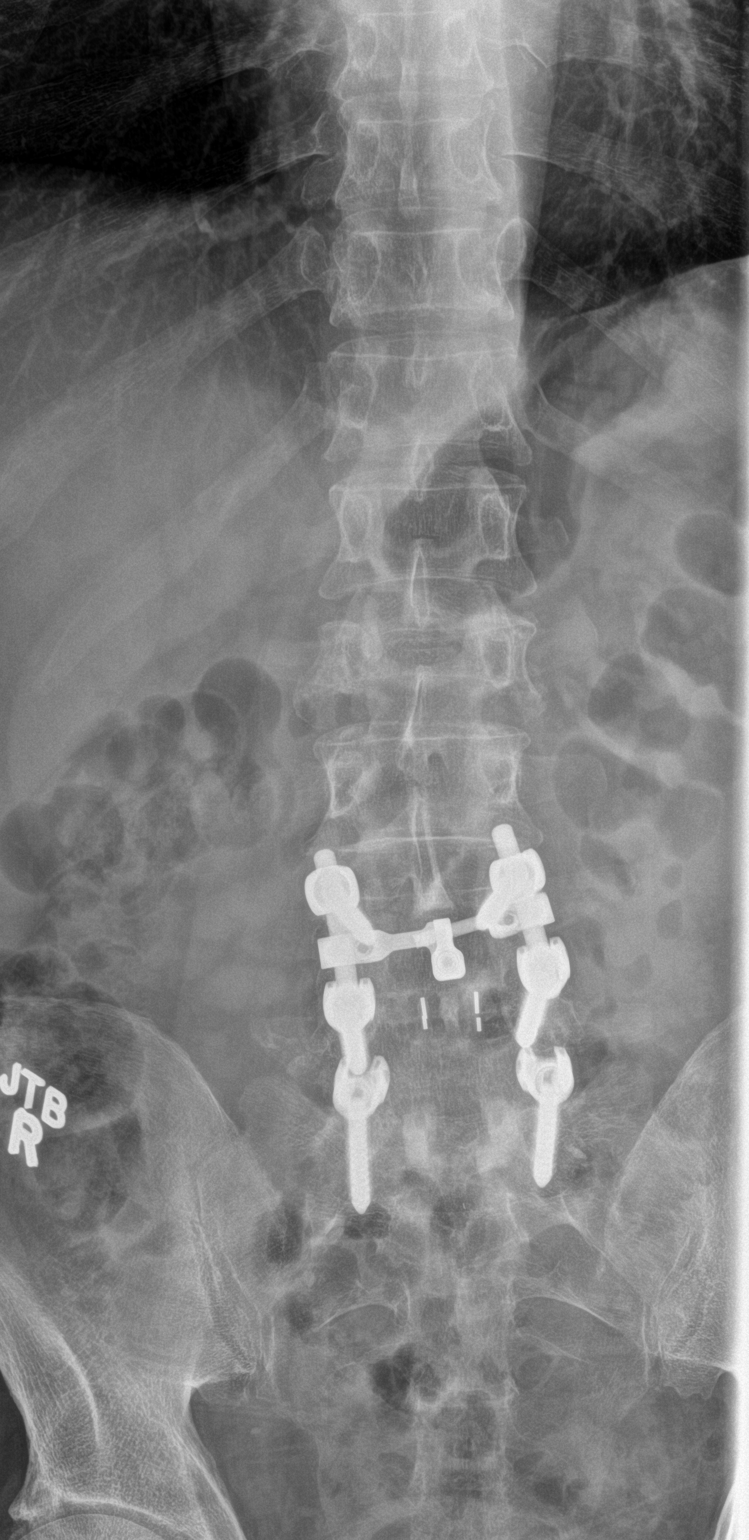

[l-spine lat]
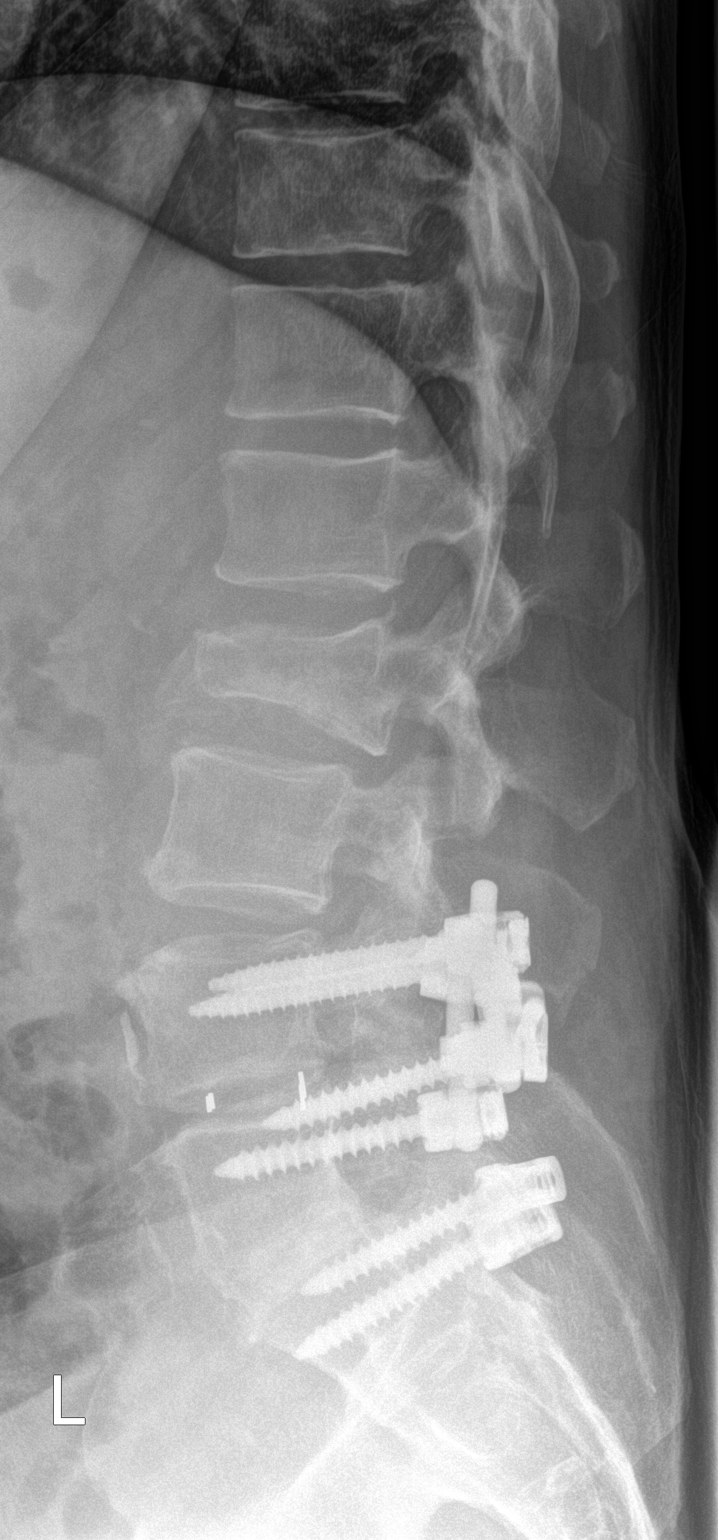

[2 of 2 positions shown; findings below may reference images not displayed]

FINDINGS: Moderate compression fracture of L2 is stable from the prior study.
No new fractures. Posterior fusion hardware from L4 through S1 is
well-seated and aligned and without change.

Soft tissues are unremarkable other than stable aortic
calcifications.
IMPRESSION: Moderate compression fracture of L2, without change from the recent
prior study. No new fractures.

## 2015-11-15 DIAGNOSIS — H60512 Acute actinic otitis externa, left ear: Secondary | ICD-10-CM | POA: Diagnosis not present

## 2015-11-15 DIAGNOSIS — G894 Chronic pain syndrome: Secondary | ICD-10-CM | POA: Diagnosis not present

## 2015-11-17 DIAGNOSIS — E291 Testicular hypofunction: Secondary | ICD-10-CM | POA: Diagnosis not present

## 2015-11-17 DIAGNOSIS — M19071 Primary osteoarthritis, right ankle and foot: Secondary | ICD-10-CM | POA: Diagnosis not present

## 2015-11-17 DIAGNOSIS — F341 Dysthymic disorder: Secondary | ICD-10-CM | POA: Diagnosis not present

## 2015-11-17 DIAGNOSIS — E782 Mixed hyperlipidemia: Secondary | ICD-10-CM | POA: Diagnosis not present

## 2015-11-23 DIAGNOSIS — M542 Cervicalgia: Secondary | ICD-10-CM | POA: Diagnosis not present

## 2015-11-23 DIAGNOSIS — M5481 Occipital neuralgia: Secondary | ICD-10-CM | POA: Diagnosis not present

## 2015-12-02 DIAGNOSIS — E291 Testicular hypofunction: Secondary | ICD-10-CM | POA: Diagnosis not present

## 2015-12-02 DIAGNOSIS — G894 Chronic pain syndrome: Secondary | ICD-10-CM | POA: Diagnosis not present

## 2015-12-13 DIAGNOSIS — M542 Cervicalgia: Secondary | ICD-10-CM | POA: Diagnosis not present

## 2015-12-13 DIAGNOSIS — M5481 Occipital neuralgia: Secondary | ICD-10-CM | POA: Diagnosis not present

## 2015-12-22 DIAGNOSIS — M47812 Spondylosis without myelopathy or radiculopathy, cervical region: Secondary | ICD-10-CM | POA: Diagnosis not present

## 2015-12-22 DIAGNOSIS — M5481 Occipital neuralgia: Secondary | ICD-10-CM | POA: Diagnosis not present

## 2015-12-27 DIAGNOSIS — M5481 Occipital neuralgia: Secondary | ICD-10-CM | POA: Diagnosis not present

## 2015-12-27 DIAGNOSIS — M47812 Spondylosis without myelopathy or radiculopathy, cervical region: Secondary | ICD-10-CM | POA: Diagnosis not present

## 2015-12-27 DIAGNOSIS — M542 Cervicalgia: Secondary | ICD-10-CM | POA: Diagnosis not present

## 2015-12-27 DIAGNOSIS — S32001A Stable burst fracture of unspecified lumbar vertebra, initial encounter for closed fracture: Secondary | ICD-10-CM | POA: Diagnosis not present

## 2016-01-10 DIAGNOSIS — M47812 Spondylosis without myelopathy or radiculopathy, cervical region: Secondary | ICD-10-CM | POA: Diagnosis not present

## 2016-01-18 DIAGNOSIS — S32001A Stable burst fracture of unspecified lumbar vertebra, initial encounter for closed fracture: Secondary | ICD-10-CM | POA: Diagnosis not present

## 2016-01-18 DIAGNOSIS — M542 Cervicalgia: Secondary | ICD-10-CM | POA: Diagnosis not present

## 2016-01-18 DIAGNOSIS — R030 Elevated blood-pressure reading, without diagnosis of hypertension: Secondary | ICD-10-CM | POA: Diagnosis not present

## 2016-01-18 DIAGNOSIS — Z79899 Other long term (current) drug therapy: Secondary | ICD-10-CM | POA: Diagnosis not present

## 2016-01-18 DIAGNOSIS — Z5181 Encounter for therapeutic drug level monitoring: Secondary | ICD-10-CM | POA: Diagnosis not present

## 2016-01-18 DIAGNOSIS — Z79891 Long term (current) use of opiate analgesic: Secondary | ICD-10-CM | POA: Diagnosis not present

## 2016-03-13 DIAGNOSIS — M5416 Radiculopathy, lumbar region: Secondary | ICD-10-CM | POA: Diagnosis not present

## 2016-03-13 DIAGNOSIS — S32001A Stable burst fracture of unspecified lumbar vertebra, initial encounter for closed fracture: Secondary | ICD-10-CM | POA: Diagnosis not present

## 2016-04-20 DIAGNOSIS — M5416 Radiculopathy, lumbar region: Secondary | ICD-10-CM | POA: Diagnosis not present

## 2016-04-20 DIAGNOSIS — M542 Cervicalgia: Secondary | ICD-10-CM | POA: Diagnosis not present

## 2016-04-20 DIAGNOSIS — M545 Low back pain: Secondary | ICD-10-CM | POA: Diagnosis not present

## 2016-04-26 ENCOUNTER — Other Ambulatory Visit: Payer: Self-pay | Admitting: Neurosurgery

## 2016-04-26 DIAGNOSIS — M5416 Radiculopathy, lumbar region: Secondary | ICD-10-CM

## 2016-04-27 DIAGNOSIS — M542 Cervicalgia: Secondary | ICD-10-CM | POA: Diagnosis not present

## 2016-04-27 DIAGNOSIS — H9209 Otalgia, unspecified ear: Secondary | ICD-10-CM | POA: Diagnosis not present

## 2016-04-27 DIAGNOSIS — J309 Allergic rhinitis, unspecified: Secondary | ICD-10-CM | POA: Diagnosis not present

## 2016-05-02 ENCOUNTER — Ambulatory Visit
Admission: RE | Admit: 2016-05-02 | Discharge: 2016-05-02 | Disposition: A | Discharge status: Home / Self Care | Payer: 59 | Source: Ambulatory Visit | Attending: Neurosurgery | Admitting: Neurosurgery

## 2016-05-02 DIAGNOSIS — M5416 Radiculopathy, lumbar region: Secondary | ICD-10-CM

## 2016-05-02 DIAGNOSIS — M4806 Spinal stenosis, lumbar region: Secondary | ICD-10-CM | POA: Diagnosis not present

## 2016-05-02 MED ORDER — DIAZEPAM 5 MG PO TABS
10.0000 mg | ORAL_TABLET | Freq: Once | ORAL | Status: AC
  Administered 2016-05-02: 10 mg via ORAL

## 2016-05-02 MED ORDER — ONDANSETRON HCL 4 MG/2ML IJ SOLN
4.0000 mg | Freq: Once | INTRAMUSCULAR | Status: AC
  Administered 2016-05-02: 4 mg via INTRAMUSCULAR

## 2016-05-02 MED ORDER — ONDANSETRON HCL 4 MG/2ML IJ SOLN: 4.0000 mg | Freq: Four times a day (QID) | INTRAMUSCULAR | Status: DC | PRN

## 2016-05-02 MED ORDER — IOPAMIDOL (ISOVUE-M 200) INJECTION 41%
15.0000 mL | Freq: Once | INTRAMUSCULAR | Status: AC
  Administered 2016-05-02: 15 mL via INTRATHECAL

## 2016-05-02 MED ORDER — MEPERIDINE HCL 100 MG/ML IJ SOLN
75.0000 mg | Freq: Once | INTRAMUSCULAR | Status: AC
  Administered 2016-05-02: 75 mg via INTRAMUSCULAR

## 2016-05-02 NOTE — Discharge Instructions (Signed)

## 2016-05-15 DIAGNOSIS — M542 Cervicalgia: Secondary | ICD-10-CM | POA: Diagnosis not present

## 2016-05-29 DIAGNOSIS — M47812 Spondylosis without myelopathy or radiculopathy, cervical region: Secondary | ICD-10-CM | POA: Diagnosis not present

## 2016-06-05 DIAGNOSIS — M79671 Pain in right foot: Secondary | ICD-10-CM | POA: Diagnosis not present

## 2016-06-05 DIAGNOSIS — M79672 Pain in left foot: Secondary | ICD-10-CM | POA: Diagnosis not present

## 2016-06-25 DIAGNOSIS — M47812 Spondylosis without myelopathy or radiculopathy, cervical region: Secondary | ICD-10-CM | POA: Diagnosis not present

## 2016-06-26 ENCOUNTER — Encounter: Payer: Self-pay | Admitting: Diagnostic Neuroimaging

## 2016-06-26 ENCOUNTER — Ambulatory Visit (INDEPENDENT_AMBULATORY_CARE_PROVIDER_SITE_OTHER): Payer: 59 | Admitting: Diagnostic Neuroimaging

## 2016-06-26 VITALS — BP 139/78 | HR 66 | Ht 73.0 in | Wt 170.6 lb

## 2016-06-26 DIAGNOSIS — G629 Polyneuropathy, unspecified: Secondary | ICD-10-CM | POA: Diagnosis not present

## 2016-06-26 DIAGNOSIS — M5416 Radiculopathy, lumbar region: Secondary | ICD-10-CM | POA: Diagnosis not present

## 2016-06-26 DIAGNOSIS — M5412 Radiculopathy, cervical region: Secondary | ICD-10-CM

## 2016-06-26 DIAGNOSIS — E539 Vitamin B deficiency, unspecified: Secondary | ICD-10-CM

## 2016-06-26 NOTE — Patient Instructions (Signed)
Thank you for coming to see [place] at [facility]. I hope we have been able to provide you high quality care today.  You may receive a patient satisfaction survey over the next few weeks. We would appreciate your feedback and comments so that we may continue to improve ourselves and the health of our patients.  - I will check neuropathy labs   ~~~~~~~~~~~~~~~~~~~~~~~~~~~~~~~~~~~~~~~~~~~~~~~~~~~~~~~~~~~~~~~~~  DR. PENUMALLI'S GUIDE TO HAPPY AND HEALTHY LIVING These are some of my general health and wellness recommendations. Some of them may apply to you better than others. Please use common sense as you try these suggestions and feel free to ask me any questions.   ACTIVITY/FITNESS Mental, social, emotional and physical stimulation are very important for brain and body health. Try learning a new activity (arts, music, language, sports, games).  Keep moving your body to the best of your abilities. You can do this at home, inside or outside, the park, community center, gym or anywhere you like. Consider a physical therapist or personal trainer to get started. Consider the app Sworkit. Fitness trackers such as smart-watches, smart-phones or Fitbits can help as well.   NUTRITION Eat more plants: colorful vegetables, nuts, seeds and berries.  Eat less sugar, salt, preservatives and processed foods.  Avoid toxins such as cigarettes and alcohol.  Drink water when you are thirsty. Warm water with a slice of lemon is an excellent morning drink to start the day.  Consider these websites for more information The [employer] (https://www.henry-hernandez.biz/) [employer] Nutrition (WindowBlog.ch)   RELAXATION Consider practicing mindfulness meditation or other relaxation techniques such as deep breathing, prayer, yoga, tai chi, massage. See website mindful.org or the apps Headspace or Calm to help get started.   SLEEP Try to get at  least 7-8+ hours sleep per day. Regular exercise and reduced caffeine will help you sleep better. Practice good sleep hygeine techniques. See website sleep.org for more information.   PLANNING Prepare estate planning, living will, healthcare POA documents. Sometimes this is best planned with the help of an attorney. Theconversationproject.org and agingwithdignity.org are excellent resources.

## 2016-06-26 NOTE — Progress Notes (Signed)
[facility]  PATIENT: Bradley Espinoza DOB: 11-04-1955  REFERRING CLINICIAN: Eather Colas HISTORY FROM: patient  REASON FOR VISIT: new consult    HISTORICAL  CHIEF COMPLAINT:  Chief Complaint  Patient presents with  . Numbness    rm 6, New Pt, "numbness both feet; pain in both feet, R > L, hurt after I walk, burn; hurt in bed also"    HISTORY OF PRESENT ILLNESS:   60 year old right-handed male here for evaluation of burning feet sensation. Symptoms started around middle of 2016. Of note patient fell 40 feet from the air while trying to cut tree limb branch, landing on his feet. He had a severe bilateral foot fractures, severe neck and low back pain. He was in hospital and rehabilitation for approximately 3 months. He did not notice severe burning sensation in his feet initially although he was on high doses of pain medication and had severe pain throughout his body. Once he started to recover and started walking, he noticed burning sensation in his feet.  Patient has had multiple surgeries on his feet (one on left foot, 4 on right foot). He has had multiple neck and back surgeries in 2004, 2006, 2008 prior to his accident. He had one additional lumbar spine surgery after his accident in 2016. He is currently in pain management with Dr. Maryjean Ka.   Patient denies history of current alcohol abuse, diabetes, kidney or thyroid disease. Patient lives alone and is disabled.   REVIEW OF SYSTEMS: Full 14 system review of systems performed and negative with exception of: Wheezing feeling hot easy bruising headache weakness restless legs depression anxiety joint pain aching muscles disinterest in activities.   ALLERGIES: Allergies  Allergen Reactions  . Tylenol With Codeine #3 [Acetaminophen-Codeine] Nausea And Vomiting    HOME MEDICATIONS: Outpatient Medications Prior to Visit  Medication Sig Dispense Refill  . docusate sodium (COLACE) 100 MG capsule Take 100 mg by  mouth at bedtime.     Marland Kitchen ibuprofen (ADVIL,MOTRIN) 200 MG tablet Take 800 mg by mouth daily as needed for headache.     . oxyCODONE-acetaminophen (PERCOCET) 10-325 MG per tablet Take 1 tablet by mouth every 4 (four) hours as needed for pain.    . polyethylene glycol (MIRALAX / GLYCOLAX) packet Take 17 g by mouth daily as needed (constipation).    . ranitidine (ZANTAC) 150 MG tablet Take 150 mg by mouth every 12 (twelve) hours.     . Testosterone 20.25 MG/1.25GM (1.62%) GEL Place 40.5 mg onto the skin 2 (two) times a week. Mondays and Fridays    . VITAMIN E PO Take 2 capsules by mouth daily after supper.    . vitamin C (ASCORBIC ACID) 500 MG tablet Take 500 mg by mouth daily after supper.    . diazepam (VALIUM) 5 MG tablet Take 1 tablet (5 mg total) by mouth every 6 (six) hours as needed. For muscle pain (Patient not taking: Reported on 06/26/2016) 30 tablet 0  . diclofenac sodium (VOLTAREN) 1 % GEL Apply 1 application topically daily as needed (pain).    . Multiple Vitamin (MULTIVITAMIN WITH MINERALS) TABS tablet Take 1 tablet by mouth daily after supper.    . Cholecalciferol (VITAMIN D PO) Take 1 tablet by mouth daily after supper.    Marland Kitchen Fexofenadine HCl (ALLEGRA PO) Take 1 tablet by mouth daily as needed (allergies).    . OxyCODONE HCl ER (OXYCONTIN) 30 MG T12A Take one tablet by mouth every 12 hours for pain. Do not  crush (Patient taking differently: Take 30 mg by mouth every 12 (twelve) hours. 7am and 7pm, scheduled) 60 each 0   No facility-administered medications prior to visit.     PAST MEDICAL HISTORY: Past Medical History:  Diagnosis Date  . Anxiety   . Arthritis    low back pain, lumbar radiculopathy  . Depression   . Fracture    B/L ankles  . GERD (gastroesophageal reflux disease)   . Headache(784.0)    allergy related   . History of kidney stones   . History of stomach ulcers   . Retained orthopedic hardware    failed retained hardware right foot  . Wears glasses     PAST  SURGICAL HISTORY: Past Surgical History:  Procedure Laterality Date  . ANKLE FUSION Right 05/11/2015   Procedure: Right Posterior Arthroscopic Subtalar Arthrodesis;  Surgeon: Newt Minion, MD;  Location: [place];  Service: Orthopedics;  Laterality: Right;  . BACK SURGERY  2004   x 2  . CERVICAL SPINE SURGERY  2008  . ESOPHAGOGASTRODUODENOSCOPY    . HARDWARE REMOVAL Right 10/09/2014   Procedure: Removal Deep Hardware, Irrigation and Debridement Calcaneus, Place Antibiotic Beads and Wound VAC ;  Surgeon: Newt Minion, MD;  Location: [place];  Service: Orthopedics;  Laterality: Right;  . HARDWARE REMOVAL Right 08/12/2015   Procedure: Removal Deep Hardware Right Foot;  Surgeon: Newt Minion, MD;  Location: [place];  Service: Orthopedics;  Laterality: Right;  . I&D EXTREMITY Right 09/15/2014   Procedure: IRRIGATION AND DEBRIDEMENT Ankle;  Surgeon: Renette Butters, MD;  Location: [place];  Service: Orthopedics;  Laterality: Right;  . INGUINAL HERNIA REPAIR Bilateral   . ORIF CALCANEOUS FRACTURE Right 09/19/2014   Procedure: OPEN REDUCTION INTERNAL FIXATION (ORIF) CALCANEOUS FRACTURE;  Surgeon: Newt Minion, MD;  Location: [place];  Service: Orthopedics;  Laterality: Right;  . ORIF CALCANEOUS FRACTURE Left 09/19/2014   Procedure: OPEN REDUCTION INTERNAL FIXATION (ORIF) CALCANEOUS FRACTURE;  Surgeon: Newt Minion, MD;  Location: [place];  Service: Orthopedics;  Laterality: Left;    FAMILY HISTORY: Family History  Problem Relation Age of Onset  . Diabetes Father   . Cancer Other     SOCIAL HISTORY:  Social History   Social History  . Marital status: Married    Spouse name: N/A  . Number of children: 2  . Years of education: 11   Occupational History  .      disabled   Social History Main Topics  . Smoking status: Former Smoker    Packs/day: 1.00    Years: 10.00    Types: Cigarettes, Cigars    Start date: 10/08/1976    Quit date: 09/07/2014  . Smokeless tobacco: Never Used     Comment:  06/26/16 reports smokes a cigar 2 times a week at the most  . Alcohol use No     Comment: 06/26/16 quit 1998  . Drug use: No  . Sexual activity: Yes    Birth control/ protection: None   Other Topics Concern  . Not on file   Social History Narrative   Lives with wife   Caffeine - none     PHYSICAL EXAM  GENERAL EXAM/CONSTITUTIONAL: Vitals:  Vitals:   06/26/16 1150  BP: 139/78  Pulse: 66  Weight: 170 lb 9.6 oz (77.4 kg)  Height: 6\' 1"  (1.854 m)     Body mass index is 22.51 kg/m.  No exam data present  Patient is in no distress; well  developed, nourished and groomed; neck is supple  CARDIOVASCULAR:  Examination of carotid arteries is normal; no carotid bruits  Regular rate and rhythm, no murmurs  Examination of peripheral vascular system by observation and palpation is normal  EYES:  Ophthalmoscopic exam of optic discs and posterior segments is normal; no papilledema or hemorrhages  MUSCULOSKELETAL:  Gait, strength, tone, movements noted in Neurologic exam below  NEUROLOGIC: MENTAL STATUS:  No flowsheet data found.  awake, alert, oriented to person, place and time  recent and remote memory intact  normal attention and concentration  language fluent, comprehension intact, naming intact,   fund of knowledge appropriate  CRANIAL NERVE:   2nd - no papilledema on fundoscopic exam  2nd, 3rd, 4th, 6th - pupils equal and reactive to light, visual fields full to confrontation, extraocular muscles intact, no nystagmus  5th - facial sensation symmetric  7th - facial strength symmetric  8th - hearing intact  9th - palate elevates symmetrically, uvula midline  11th - shoulder shrug symmetric  12th - tongue protrusion midline  MOTOR:   normal bulk and tone, full strength in the BUE, BLE  DECR RIGHT DORSIFLEXION  SENSORY:   normal and symmetric to light touch, pinprick, temperature, vibration; EXCEPT DECR PP, TEMP AND VIB IN FEET; LEFT FOOT  WORSE THAN RIGHT.  COORDINATION:   finger-nose-finger, fine finger movements normal  REFLEXES:   deep tendon reflexes --> BUE 3, KNEES 3, ANKLES 0; DOWN GOING TOES  GAIT/STATION:   narrow based gait; ANTALGIC GAIT; LIMPING ON RIGHT FOOT    DIAGNOSTIC DATA (LABS, IMAGING, TESTING) - I reviewed patient records, labs, notes, testing and imaging myself where available.  Lab Results  Component Value Date   WBC 6.8 08/12/2015   HGB 15.1 08/12/2015   HCT 44.9 08/12/2015   MCV 94.1 08/12/2015   PLT 182 08/12/2015      Component Value Date/Time   NA 139 08/12/2015 1149   K 4.0 08/12/2015 1149   CL 107 08/12/2015 1149   CO2 26 08/12/2015 1149   GLUCOSE 102 (H) 08/12/2015 1149   BUN 18 08/12/2015 1149   CREATININE 0.84 08/12/2015 1149   CALCIUM 9.4 08/12/2015 1149   PROT 7.3 08/12/2015 1149   ALBUMIN 4.3 08/12/2015 1149   AST 21 08/12/2015 1149   ALT 21 08/12/2015 1149   ALKPHOS 82 08/12/2015 1149   BILITOT 0.9 08/12/2015 1149   GFRNONAA >60 08/12/2015 1149   GFRAA >60 08/12/2015 1149   No results found for: CHOL, HDL, LDLCALC, LDLDIRECT, TRIG, CHOLHDL No results found for: HGBA1C No results found for: VITAMINB12 No results found for: TSH  05/02/16 CT lumbar [I reviewed images myself and agree with interpretation. -VRP]  1. Interval healing of L2 burst fracture. Unchanged retropulsion with mild spinal stenosis. 2. Progressive disc degeneration at L3-4 with mild bilateral lateral recess and neural foraminal stenosis. 3. Interval L1-L3 posterior fusion. Mild lucency about the L1 screws. No solid osseous fusion. 4. Solid L4-S1 fusion without residual impingement. 5. Aortic atherosclerosis.    ASSESSMENT AND PLAN  60 y.o. year old male here with history of neck and lumbar spine surgeries in 2004, 2006, 2008, then with unfortunate accident falling 40 feet from a tree resulting in bilateral foot fractures and lumbar spine compression fracture. Now with posttraumatic pain  and burning in his feet. 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 Other considerations would include traumatic lumbar radiculopathy. Coincidental metabolic or other secondary peripheral neuropathy is possible but less likely.   Ddx: traumatic neuropathy,  peripheral neuropathy (metabolic), lumbar radiculopathy  1. Burning feet syndrome   2. Neuropathy ([place])   3. Lumbar radiculopathy   4. Cervical radiculopathy     PLAN: - will check B12, TSH and A1c - follow up with Dr. Joya Salm and Dr. Maryjean Ka re: low back pain - follow up with Dr. Maryjean Ka re: pain mgmt for nerve pain medications (could consider adding gabapentin, lyrica or cymbalta to current pain med regimen)  Orders Placed This Encounter  Procedures  . Vitamin B12  . Hemoglobin A1c  . TSH   Return if symptoms worsen or fail to improve, for return to spine surgeon and pain mgmt clinic.    Penni Bombard, MD [id number], [id number] PM Certified in Neurology, Neurophysiology and Neuroimaging  [facility] [address], [place], [place] [address] [phone]

## 2016-06-27 ENCOUNTER — Telehealth: Payer: Self-pay | Admitting: *Deleted

## 2016-06-27 LAB — VITAMIN B12: VITAMIN B 12: 259 pg/mL (ref 211–946)

## 2016-06-27 LAB — TSH: TSH: 0.638 u[IU]/mL (ref 0.450–4.500)

## 2016-06-27 LAB — HEMOGLOBIN A1C
ESTIMATED AVERAGE GLUCOSE: 100 mg/dL
HEMOGLOBIN A1C: 5.1 % (ref 4.8–5.6)

## 2016-06-27 NOTE — Telephone Encounter (Signed)
Spoke with patient and informed him his lab results are unremarkable. Advised he continue with Dr Gladstone Lighter instructions, recommendations. Reviewed those specifically with patient.  He verbalized understanding, appreciation.

## 2016-06-28 ENCOUNTER — Telehealth: Payer: Self-pay | Admitting: *Deleted

## 2016-06-28 NOTE — Telephone Encounter (Signed)
Called and spoke to patient about lab results per Dr Leta Baptist note. Pt verbalized understanding and stated he already spoke to Verneita Griffes, RN yesterday. He has no further questions at this time.

## 2016-06-28 NOTE — Telephone Encounter (Signed)
-----   Message from Penni Bombard, MD sent at 06/28/2016  2:08 PM EDT ----- Unremarkable labs. Continue current plan. Please call patient. -VRP

## 2016-07-02 DIAGNOSIS — M47812 Spondylosis without myelopathy or radiculopathy, cervical region: Secondary | ICD-10-CM | POA: Diagnosis not present

## 2016-07-26 DIAGNOSIS — M47812 Spondylosis without myelopathy or radiculopathy, cervical region: Secondary | ICD-10-CM | POA: Diagnosis not present

## 2016-07-26 DIAGNOSIS — M542 Cervicalgia: Secondary | ICD-10-CM | POA: Diagnosis not present

## 2016-07-26 DIAGNOSIS — M545 Low back pain: Secondary | ICD-10-CM | POA: Diagnosis not present

## 2016-07-26 DIAGNOSIS — R030 Elevated blood-pressure reading, without diagnosis of hypertension: Secondary | ICD-10-CM | POA: Diagnosis not present

## 2016-08-27 DIAGNOSIS — M542 Cervicalgia: Secondary | ICD-10-CM | POA: Diagnosis not present

## 2016-08-27 DIAGNOSIS — R030 Elevated blood-pressure reading, without diagnosis of hypertension: Secondary | ICD-10-CM | POA: Diagnosis not present

## 2016-09-06 ENCOUNTER — Other Ambulatory Visit: Payer: Self-pay | Admitting: Neurosurgery

## 2016-09-06 DIAGNOSIS — M542 Cervicalgia: Secondary | ICD-10-CM

## 2016-09-10 DIAGNOSIS — M5416 Radiculopathy, lumbar region: Secondary | ICD-10-CM | POA: Diagnosis not present

## 2016-09-10 DIAGNOSIS — M545 Low back pain: Secondary | ICD-10-CM | POA: Diagnosis not present

## 2016-09-24 ENCOUNTER — Telehealth: Payer: Self-pay

## 2016-09-25 ENCOUNTER — Ambulatory Visit
Admission: RE | Admit: 2016-09-25 | Discharge: 2016-09-25 | Disposition: A | Discharge status: Home / Self Care | Payer: 59 | Source: Ambulatory Visit | Attending: Neurosurgery | Admitting: Neurosurgery

## 2016-09-25 DIAGNOSIS — M5031 Other cervical disc degeneration,  high cervical region: Secondary | ICD-10-CM | POA: Diagnosis not present

## 2016-09-25 DIAGNOSIS — M542 Cervicalgia: Secondary | ICD-10-CM

## 2016-09-25 MED ORDER — MEPERIDINE HCL 100 MG/ML IJ SOLN
100.0000 mg | Freq: Once | INTRAMUSCULAR | Status: AC
  Administered 2016-09-25: 100 mg via INTRAMUSCULAR

## 2016-09-25 MED ORDER — IOPAMIDOL (ISOVUE-M 300) INJECTION 61%
10.0000 mL | Freq: Once | INTRAMUSCULAR | Status: AC | PRN
  Administered 2016-09-25: 10 mL via INTRATHECAL

## 2016-09-25 MED ORDER — ONDANSETRON HCL 4 MG/2ML IJ SOLN
4.0000 mg | Freq: Once | INTRAMUSCULAR | Status: AC
  Administered 2016-09-25: 4 mg via INTRAMUSCULAR

## 2016-09-25 MED ORDER — DIAZEPAM 5 MG PO TABS
10.0000 mg | ORAL_TABLET | Freq: Once | ORAL | Status: AC
  Administered 2016-09-25: 10 mg via ORAL

## 2016-09-25 NOTE — Discharge Instructions (Signed)

## 2016-09-27 DIAGNOSIS — R030 Elevated blood-pressure reading, without diagnosis of hypertension: Secondary | ICD-10-CM | POA: Diagnosis not present

## 2016-09-27 DIAGNOSIS — M5412 Radiculopathy, cervical region: Secondary | ICD-10-CM | POA: Diagnosis not present

## 2016-10-11 DIAGNOSIS — M5412 Radiculopathy, cervical region: Secondary | ICD-10-CM | POA: Diagnosis not present

## 2016-11-01 DIAGNOSIS — M5412 Radiculopathy, cervical region: Secondary | ICD-10-CM | POA: Diagnosis not present

## 2016-11-01 DIAGNOSIS — M5416 Radiculopathy, lumbar region: Secondary | ICD-10-CM | POA: Diagnosis not present

## 2016-11-01 DIAGNOSIS — R030 Elevated blood-pressure reading, without diagnosis of hypertension: Secondary | ICD-10-CM | POA: Diagnosis not present

## 2016-11-01 DIAGNOSIS — S32001A Stable burst fracture of unspecified lumbar vertebra, initial encounter for closed fracture: Secondary | ICD-10-CM | POA: Diagnosis not present

## 2016-11-19 DIAGNOSIS — M5137 Other intervertebral disc degeneration, lumbosacral region: Secondary | ICD-10-CM | POA: Diagnosis not present

## 2016-11-19 DIAGNOSIS — M542 Cervicalgia: Secondary | ICD-10-CM | POA: Diagnosis not present

## 2016-11-23 DIAGNOSIS — G8929 Other chronic pain: Secondary | ICD-10-CM | POA: Diagnosis not present

## 2016-11-23 DIAGNOSIS — M6259 Muscle wasting and atrophy, not elsewhere classified, multiple sites: Secondary | ICD-10-CM | POA: Diagnosis not present

## 2016-11-23 DIAGNOSIS — M2560 Stiffness of unspecified joint, not elsewhere classified: Secondary | ICD-10-CM | POA: Diagnosis not present

## 2016-11-23 DIAGNOSIS — M542 Cervicalgia: Secondary | ICD-10-CM | POA: Diagnosis not present

## 2016-11-27 DIAGNOSIS — M2560 Stiffness of unspecified joint, not elsewhere classified: Secondary | ICD-10-CM | POA: Diagnosis not present

## 2016-11-27 DIAGNOSIS — M6259 Muscle wasting and atrophy, not elsewhere classified, multiple sites: Secondary | ICD-10-CM | POA: Diagnosis not present

## 2016-11-27 DIAGNOSIS — M542 Cervicalgia: Secondary | ICD-10-CM | POA: Diagnosis not present

## 2016-11-27 DIAGNOSIS — G8929 Other chronic pain: Secondary | ICD-10-CM | POA: Diagnosis not present

## 2016-11-30 DIAGNOSIS — M542 Cervicalgia: Secondary | ICD-10-CM | POA: Diagnosis not present

## 2016-11-30 DIAGNOSIS — G8929 Other chronic pain: Secondary | ICD-10-CM | POA: Diagnosis not present

## 2016-11-30 DIAGNOSIS — M2560 Stiffness of unspecified joint, not elsewhere classified: Secondary | ICD-10-CM | POA: Diagnosis not present

## 2016-11-30 DIAGNOSIS — M6259 Muscle wasting and atrophy, not elsewhere classified, multiple sites: Secondary | ICD-10-CM | POA: Diagnosis not present

## 2016-12-04 DIAGNOSIS — G8929 Other chronic pain: Secondary | ICD-10-CM | POA: Diagnosis not present

## 2016-12-04 DIAGNOSIS — M6259 Muscle wasting and atrophy, not elsewhere classified, multiple sites: Secondary | ICD-10-CM | POA: Diagnosis not present

## 2016-12-04 DIAGNOSIS — M542 Cervicalgia: Secondary | ICD-10-CM | POA: Diagnosis not present

## 2016-12-04 DIAGNOSIS — M2560 Stiffness of unspecified joint, not elsewhere classified: Secondary | ICD-10-CM | POA: Diagnosis not present

## 2016-12-07 DIAGNOSIS — M2560 Stiffness of unspecified joint, not elsewhere classified: Secondary | ICD-10-CM | POA: Diagnosis not present

## 2016-12-07 DIAGNOSIS — M542 Cervicalgia: Secondary | ICD-10-CM | POA: Diagnosis not present

## 2016-12-07 DIAGNOSIS — M6259 Muscle wasting and atrophy, not elsewhere classified, multiple sites: Secondary | ICD-10-CM | POA: Diagnosis not present

## 2016-12-07 DIAGNOSIS — G8929 Other chronic pain: Secondary | ICD-10-CM | POA: Diagnosis not present

## 2016-12-11 DIAGNOSIS — M2560 Stiffness of unspecified joint, not elsewhere classified: Secondary | ICD-10-CM | POA: Diagnosis not present

## 2016-12-11 DIAGNOSIS — M6259 Muscle wasting and atrophy, not elsewhere classified, multiple sites: Secondary | ICD-10-CM | POA: Diagnosis not present

## 2016-12-11 DIAGNOSIS — G8929 Other chronic pain: Secondary | ICD-10-CM | POA: Diagnosis not present

## 2016-12-11 DIAGNOSIS — M542 Cervicalgia: Secondary | ICD-10-CM | POA: Diagnosis not present

## 2016-12-13 DIAGNOSIS — G8929 Other chronic pain: Secondary | ICD-10-CM | POA: Diagnosis not present

## 2016-12-13 DIAGNOSIS — M6259 Muscle wasting and atrophy, not elsewhere classified, multiple sites: Secondary | ICD-10-CM | POA: Diagnosis not present

## 2016-12-13 DIAGNOSIS — M542 Cervicalgia: Secondary | ICD-10-CM | POA: Diagnosis not present

## 2016-12-13 DIAGNOSIS — M2560 Stiffness of unspecified joint, not elsewhere classified: Secondary | ICD-10-CM | POA: Diagnosis not present

## 2016-12-24 DIAGNOSIS — M542 Cervicalgia: Secondary | ICD-10-CM | POA: Diagnosis not present

## 2016-12-24 DIAGNOSIS — M5137 Other intervertebral disc degeneration, lumbosacral region: Secondary | ICD-10-CM | POA: Diagnosis not present

## 2017-01-07 DIAGNOSIS — M5416 Radiculopathy, lumbar region: Secondary | ICD-10-CM | POA: Diagnosis not present

## 2017-01-08 DIAGNOSIS — M2560 Stiffness of unspecified joint, not elsewhere classified: Secondary | ICD-10-CM | POA: Diagnosis not present

## 2017-01-08 DIAGNOSIS — G8929 Other chronic pain: Secondary | ICD-10-CM | POA: Diagnosis not present

## 2017-01-08 DIAGNOSIS — M6259 Muscle wasting and atrophy, not elsewhere classified, multiple sites: Secondary | ICD-10-CM | POA: Diagnosis not present

## 2017-01-08 DIAGNOSIS — W14XXXS Fall from tree, sequela: Secondary | ICD-10-CM | POA: Diagnosis not present

## 2017-01-08 DIAGNOSIS — R202 Paresthesia of skin: Secondary | ICD-10-CM | POA: Diagnosis not present

## 2017-01-08 DIAGNOSIS — M542 Cervicalgia: Secondary | ICD-10-CM | POA: Diagnosis not present

## 2017-01-08 DIAGNOSIS — M545 Low back pain: Secondary | ICD-10-CM | POA: Diagnosis not present

## 2017-01-08 DIAGNOSIS — Z969 Presence of functional implant, unspecified: Secondary | ICD-10-CM | POA: Diagnosis not present

## 2017-01-08 DIAGNOSIS — M62552 Muscle wasting and atrophy, not elsewhere classified, left thigh: Secondary | ICD-10-CM | POA: Diagnosis not present

## 2017-01-17 ENCOUNTER — Encounter (HOSPITAL_COMMUNITY): Payer: Self-pay

## 2017-01-17 ENCOUNTER — Ambulatory Visit (HOSPITAL_COMMUNITY): Payer: PPO | Attending: Neurosurgery

## 2017-01-17 DIAGNOSIS — M5442 Lumbago with sciatica, left side: Secondary | ICD-10-CM | POA: Diagnosis not present

## 2017-01-17 DIAGNOSIS — M6281 Muscle weakness (generalized): Secondary | ICD-10-CM | POA: Insufficient documentation

## 2017-01-17 DIAGNOSIS — R262 Difficulty in walking, not elsewhere classified: Secondary | ICD-10-CM | POA: Diagnosis not present

## 2017-01-17 DIAGNOSIS — R29898 Other symptoms and signs involving the musculoskeletal system: Secondary | ICD-10-CM | POA: Insufficient documentation

## 2017-01-17 DIAGNOSIS — G8929 Other chronic pain: Secondary | ICD-10-CM | POA: Diagnosis not present

## 2017-01-17 NOTE — Patient Instructions (Signed)
  SINGLE KNEE TO CHEST STRETCH - [place]  While Lying on your back, hold your knee and gently pull it up towards your chest.   DOUBLE KNEE TO CHEST STRETCH - [facility]  While Lying on your back,  hold your knees and gently pull them up towards your chest.   PIRIFORMIS STRETCH - MODIFIED  While lying on your back, hold your Left knee with your opposite hand and draw your knee up and over towards your Right shoulder    Perform 2-3x/day, 3-5 sets of 30 seconds each.

## 2017-01-17 NOTE — Therapy (Signed)
[place], [place], [address] Phone: [phone]   Fax:  [phone]  Physical Therapy Evaluation  Patient Details  Name: Bradley Espinoza MRN: [id number] Date of Birth: 11-22-1955 Referring Provider: Kary Kos, MD  Encounter Date: 01/17/2017      PT End of Session - 01/17/17 1140    Visit Number 1   Number of Visits 13   Date for PT Re-Evaluation 02/07/17   Authorization Type [employer]   Authorization Time Period 01/17/17 to 02/28/17   PT Start Time 1025   PT Stop Time 1120   PT Time Calculation (min) 55 min   Activity Tolerance Patient limited by pain   Behavior During Therapy [facility] for tasks assessed/performed      Past Medical History:  Diagnosis Date  . Anxiety   . Arthritis    low back pain, lumbar radiculopathy  . Depression   . Fracture    B/L ankles  . GERD (gastroesophageal reflux disease)   . Headache(784.0)    allergy related   . History of kidney stones   . History of stomach ulcers   . Retained orthopedic hardware    failed retained hardware right foot  . Wears glasses     Past Surgical History:  Procedure Laterality Date  . ANKLE FUSION Right 05/11/2015   Procedure: Right Posterior Arthroscopic Subtalar Arthrodesis;  Surgeon: Newt Minion, MD;  Location: [place];  Service: Orthopedics;  Laterality: Right;  . BACK SURGERY  2004   x 2  . CERVICAL SPINE SURGERY  2008  . ESOPHAGOGASTRODUODENOSCOPY    . HARDWARE REMOVAL Right 10/09/2014   Procedure: Removal Deep Hardware, Irrigation and Debridement Calcaneus, Place Antibiotic Beads and Wound VAC ;  Surgeon: Newt Minion, MD;  Location: [place];  Service: Orthopedics;  Laterality: Right;  . HARDWARE REMOVAL Right 08/12/2015   Procedure: Removal Deep Hardware Right Foot;  Surgeon: Newt Minion, MD;  Location: [place];  Service: Orthopedics;  Laterality: Right;  . I&D EXTREMITY Right 09/15/2014   Procedure: IRRIGATION AND DEBRIDEMENT  Ankle;  Surgeon: Renette Butters, MD;  Location: [place];  Service: Orthopedics;  Laterality: Right;  . INGUINAL HERNIA REPAIR Bilateral   . ORIF CALCANEOUS FRACTURE Right 09/19/2014   Procedure: OPEN REDUCTION INTERNAL FIXATION (ORIF) CALCANEOUS FRACTURE;  Surgeon: Newt Minion, MD;  Location: [place];  Service: Orthopedics;  Laterality: Right;  . ORIF CALCANEOUS FRACTURE Left 09/19/2014   Procedure: OPEN REDUCTION INTERNAL FIXATION (ORIF) CALCANEOUS FRACTURE;  Surgeon: Newt Minion, MD;  Location: [place];  Service: Orthopedics;  Laterality: Left;    There were no vitals filed for this visit.       Subjective Assessment - 01/17/17 1029    Subjective Pt states that in December 2015, he was cutting a limb out of a tree while on a ladder. The limb snapped and caused him to fall. He broke both ankles and broke his L5 vertebrae. He reports he had back surgery February 2016 (but has had 2 prior fusions back in 2005 and 2008). He reports that his pain is his lower back on both sides. He states that he is having difficulty standing up and walking for long distances now due to the pain. He reports sharp shooting pains that radiate down the front of his leg. He reports n/t intermittently, stating that it is more intense if he walks a long time and sitting will relieve it. He denies b/b changes.  He denies any falls in the last 6 months but 8 months ago he tripped over a rake and fell. He reports his BLEs feels weaker, L>R, since his pain has flared up. Walking, standing up from sitting, and standing for long periods of time all aggravate his pain. Sitting and some stretching that he does help relieve his pain.    Pertinent History h/o lumbar fusion (2005, 2008, 2016), bil ankle surgery s/p fracture in 2016,    Limitations Lifting;Standing;Walking;House hold activities   How long can you sit comfortably? an hour   How long can you stand comfortably? 30 mins   How long can you walk comfortably? maybe 2 hours    Patient Stated Goals less back pain and hip pain   Currently in Pain? Yes   Pain Score 8    Pain Location Back   Pain Orientation Right;Left;Lower   Pain Descriptors / Indicators Burning;Stabbing   Pain Type Chronic pain   Pain Radiating Towards down LE, can go to the ankle   Pain Onset More than a month ago   Pain Frequency Constant   Aggravating Factors  standing up from sitting, standing and walking long periods   Pain Relieving Factors sitting and some stretching   Effect of Pain on Daily Activities limits his ability to perform household chores and IADLs            [facility] PT Assessment - 01/17/17 0001      Assessment   Medical Diagnosis DDD, lumbosacral   Referring Provider Kary Kos, MD   Onset Date/Surgical Date 09/15/14   Next MD Visit 02/18/17   Prior Therapy Rehab at [facility] following fall in 09/2014     Balance Screen   Has the patient fallen in the past 6 months No   Has the patient had a decrease in activity level because of a fear of falling?  Yes   Is the patient reluctant to leave their home because of a fear of falling?  No     Prior Function   Level of Independence Independent   Vocation On disability   Leisure takes care of animals, listening to live music     Cognition   Overall Cognitive Status Within Functional Limits for tasks assessed     Observation/Other Assessments   Focus on Therapeutic Outcomes (FOTO)  69     Sensation   Light Touch Impaired Detail   Light Touch Impaired Details Impaired LLE   Additional Comments L2, L4, S2 dimished on LLE; Knee jerk WNL, Ankle jerk diminished bil     Posture/Postural Control   Posture/Postural Control Postural limitations   Postural Limitations Decreased lumbar lordosis;Flexed trunk;Anterior pelvic tilt   Posture Comments during sitting, standing, and walking     ROM / Strength   AROM / PROM / Strength AROM;Strength     AROM   Lumbar Flexion fingers to floor  relieves it but has pain when  coming back to upright positio   Lumbar Extension max limitation  painful   Lumbar - Right Side Bend finger tips to knee joint line  painful   Lumbar - Left Side Bend finger tips just above knee joint line  painful     Strength   Right Hip Flexion 5/5   Right Hip Extension 3+/5   Right Hip ABduction 4+/5   Left Hip Flexion 4-/5   Left Hip Extension 2+/5   Left Hip ABduction 4/5   Right Knee Flexion 4+/5   Right Knee  Extension 5/5   Left Knee Flexion 4/5   Left Knee Extension 5/5   Right Ankle Dorsiflexion 4+/5   Left Ankle Dorsiflexion 4/5     Flexibility   Soft Tissue Assessment /Muscle Length yes   Hamstrings WNL BLE   Quadriceps +ELy's BLE, L>R   Piriformis R WNL, L WNL but recreated pt's hip pain and with piriformis stretch, relieved his symptoms     Palpation   Palpation comment increased soft tissue restrictions of lumbar paraspinals but not tender to palpation; increased piriformis soft tissue restrictions and tender to palpation     Special Tests    Special Tests Lumbar   Lumbar Tests other;Straight Leg Raise     Straight Leg Raise   Findings Negative   Comment BLE     other   Comments RFIS: no change in pain; REIS: increased LLE symptoms     Ambulation/Gait   Ambulation/Gait Yes   Ambulation Distance (Feet) 384 Feet  3MWT; 2 standing rest breaks,1 seated rest break due to pain   Assistive device None   Gait Pattern Step-through pattern;Decreased stride length;Decreased step length - right;Antalgic;Trunk flexed;Wide base of support  antalgic gait due to R ankle (baseline) and L hip pain   Gait Comments decreased push-off BLE during gait; pt noted to be furniture grabbing throughout 3MWT due to increased pain     Balance   Balance Assessed Yes     Static Standing Balance   Static Standing Balance -  Activities  Single Leg Stance - Right Leg;Single Leg Stance - Left Leg   Static Standing - Comment/# of Minutes RLE: 4.77 sec (R ankle pain, which is  baseline) LLE: 16.5 seconds     Standardized Balance Assessment   Standardized Balance Assessment Timed Up and Go Test;Five Times Sit to Stand   Five times sit to stand comments  31 seconds from chair with hands on thighs     Timed Up and Go Test   Normal TUG (seconds) 9.77   TUG Comments poor gait mechanics; antalgic                    PT Education - 01/17/17 1140    Education provided Yes   Education Details exam findings, POC, HEP   Person(s) Educated Patient   Methods Explanation;Demonstration;Handout   Comprehension Verbalized understanding;Returned demonstration          PT Short Term Goals - 01/17/17 1155      PT SHORT TERM GOAL #1   Title Pt will be independent with HEP to decrease pain and improve overall function.   Time 3   Period Weeks   Status New     PT SHORT TERM GOAL #2   Title Pt will have improved 5xSTS to <20 seconds with no UE support from chair to demonstrate decreased pain and improved overall strength and function.   Baseline eval: 31 sec from chair with hands on thighs    Time 3   Period Weeks   Status New     PT SHORT TERM GOAL #3   Title Pt will have improved 3MWT by 151ft or > and requiring 1 rest break or < during to demonstrate improved overall function and improved tolerance to ambulation.   Baseline eval: 371ft with 2 standing and 1 seated break   Time 3   Period Weeks   Status New     PT SHORT TERM GOAL #4   Title Pt will report being able to  sleep through the night with 1 to no awakenings due to LBP to maximize recovery and improve overall function.   Time 3   Period Weeks   Status New           PT Long Term Goals - 01/17/17 1159      PT LONG TERM GOAL #1   Title Pt will have improved 5xSTS to <10 seconds from chair with no UE support to demonstrate improved overall power, strength, and function.    Time 6   Period Weeks   Status New     PT LONG TERM GOAL #2   Title Pt will have improved overall MMT to at  least 4+/5 throughout BLE to demonstrate improved strength in order to maximize gait and function at home and in the community.   Time 6   Period Weeks   Status New     PT LONG TERM GOAL #3   Title Pt will have improved lumbar ROM by at least 50% with minimal to no pain in order to demonstrate improved overall function and maximize return to PLOF.   Time 6   Period Weeks   Status New     PT LONG TERM GOAL #4   Title Pt will report being able to stand for 1 hour or > to maximize his ability to cook, wash dishes, and feed his animals, requiring 2 rest breaks or < to demonstrate improved toleranace for extension-based activities and maximize function at home.    Time 6   Period Weeks   Status New     PT LONG TERM GOAL #5   Title Pt will have improved bil SLS to at least 30 seconds or > on firm surface to maximize gait and demonstrate improved overall strength, balance, and function.   Baseline eval: R 4.7 sec, L 16 sec   Time 6   Period Weeks   Status New               Plan - 01/17/17 1141    Clinical Impression Statement Pt is 61 YO M who presents to OPPT with c/o chronic LBP with radicular symptoms down his LLE. He has significant limitations in lumbar ROM (especially extension), deficits in MMT (L>R), balance, gait, and overall function. Pt remains in forward flexed posture during ambulation and requires frequent breaks due to increasing pain. Pt demonstrated that he has been performing Nelson at home as that relieves his pain. Pt has a flexion-preference as SKTC, DKTC, and L piriformis stretch all relieve his symptoms and extension based activities all aggravate his pain. Pt also stated that a previous therapist performed prone on elbows with OP one time and it hurt him so bad that he couldn't walk the next day. Pt also noted to have significantly tight quads bilaterally as he was unable to lay fully supine due to increased hip flexion and pain. He frequently mentioned throughout the  session how he was embarrassed to go out in public like this and he mentioned multiple times that if he could just get a shot in his hip he thinks he'd be in much better shape. Pt would benefit from skilled PT intervention to decrease pain, maximize strength, ROM, and overall function in order to promote return to PLOF.    Rehab Potential Fair   PT Frequency 2x / week   PT Duration 6 weeks   PT Treatment/Interventions ADLs/Self Care Home Management;Traction;Gait training;Stair training;Functional mobility training;Therapeutic activities;Therapeutic exercise;Balance training;Neuromuscular re-education;Patient/family education;Manual techniques;Passive range of  motion;Dry needling;Taping   PT Next Visit Plan review goals but do no issue him a copy, pt has flexion preference so perform SKTC/DKTC/piriformis stretching PRN during session to manage pain/response to exercises; trial sidelying quad stretch with strap since he has difficulty lying supine, BLE strengthening, soft tissue to lumbar paraspinals, L manual hip distraction   PT Home Exercise Plan eval: SKTC, DKTC, L piriformis stretch   Consulted and Agree with Plan of Care Patient      Patient will benefit from skilled therapeutic intervention in order to improve the following deficits and impairments:  Abnormal gait, Decreased activity tolerance, Decreased balance, Decreased endurance, Decreased mobility, Decreased range of motion, Decreased strength, Difficulty walking, Hypomobility, Impaired flexibility, Improper body mechanics, Postural dysfunction, Pain  Visit Diagnosis: Chronic bilateral low back pain with left-sided sciatica - Plan: PT plan of care cert/re-cert  Difficulty in walking, not elsewhere classified - Plan: PT plan of care cert/re-cert  Muscle weakness (generalized) - Plan: PT plan of care cert/re-cert  Other symptoms and signs involving the musculoskeletal system - Plan: PT plan of care cert/re-cert     Problem  List Patient Active Problem List   Diagnosis Date Noted  . Fracture of L2 vertebra ([place]) 01/12/2015  . Acute osteomyelitis of calcaneum ([place]) 10/09/2014  . Dysuria 10/05/2014  . Cellulitis 10/05/2014  . Fall from ladder 09/21/2014  . L2 vertebral fracture ([place]) 09/21/2014  . Bilateral calcaneal fractures 09/21/2014  . Chronic pain 09/21/2014  . Acute blood loss anemia 09/21/2014  . Open right calcaneal fracture 09/15/2014     Geraldine Solar PT, DPT   [place] [address] [place], [place], [address] Phone: [phone]   Fax:  [phone]  Name: Bradley Espinoza MRN: [id number] Date of Birth: [birth year]

## 2017-01-22 ENCOUNTER — Ambulatory Visit (HOSPITAL_COMMUNITY): Payer: PPO | Admitting: Physical Therapy

## 2017-01-22 DIAGNOSIS — M6281 Muscle weakness (generalized): Secondary | ICD-10-CM

## 2017-01-22 DIAGNOSIS — M545 Low back pain: Secondary | ICD-10-CM | POA: Diagnosis not present

## 2017-01-22 DIAGNOSIS — R29898 Other symptoms and signs involving the musculoskeletal system: Secondary | ICD-10-CM

## 2017-01-22 DIAGNOSIS — R262 Difficulty in walking, not elsewhere classified: Secondary | ICD-10-CM

## 2017-01-22 DIAGNOSIS — M25559 Pain in unspecified hip: Secondary | ICD-10-CM | POA: Diagnosis not present

## 2017-01-22 DIAGNOSIS — M5442 Lumbago with sciatica, left side: Secondary | ICD-10-CM | POA: Diagnosis not present

## 2017-01-22 DIAGNOSIS — G8929 Other chronic pain: Secondary | ICD-10-CM

## 2017-01-22 DIAGNOSIS — Z6822 Body mass index (BMI) 22.0-22.9, adult: Secondary | ICD-10-CM | POA: Diagnosis not present

## 2017-01-22 NOTE — Therapy (Signed)
[place], [place], [address] Phone: [phone]   Fax:  [phone]  Physical Therapy Treatment  Patient Details  Name: Bradley Espinoza MRN: [id number] Date of Birth: Jun 05, 1956 Referring Provider: Kary Kos, MD  Encounter Date: 01/22/2017      PT End of Session - 01/22/17 1311    Visit Number 2   Number of Visits 13   Date for PT Re-Evaluation 02/07/17   Authorization Type [employer]   Authorization Time Period 01/17/17 to 02/28/17   PT Start Time 1300   PT Stop Time 1344   PT Time Calculation (min) 44 min   Activity Tolerance Patient limited by pain   Behavior During Therapy [facility] for tasks assessed/performed      Past Medical History:  Diagnosis Date  . Anxiety   . Arthritis    low back pain, lumbar radiculopathy  . Depression   . Fracture    B/L ankles  . GERD (gastroesophageal reflux disease)   . Headache(784.0)    allergy related   . History of kidney stones   . History of stomach ulcers   . Retained orthopedic hardware    failed retained hardware right foot  . Wears glasses     Past Surgical History:  Procedure Laterality Date  . ANKLE FUSION Right 05/11/2015   Procedure: Right Posterior Arthroscopic Subtalar Arthrodesis;  Surgeon: Newt Minion, MD;  Location: [place];  Service: Orthopedics;  Laterality: Right;  . BACK SURGERY  2004   x 2  . CERVICAL SPINE SURGERY  2008  . ESOPHAGOGASTRODUODENOSCOPY    . HARDWARE REMOVAL Right 10/09/2014   Procedure: Removal Deep Hardware, Irrigation and Debridement Calcaneus, Place Antibiotic Beads and Wound VAC ;  Surgeon: Newt Minion, MD;  Location: [place];  Service: Orthopedics;  Laterality: Right;  . HARDWARE REMOVAL Right 08/12/2015   Procedure: Removal Deep Hardware Right Foot;  Surgeon: Newt Minion, MD;  Location: [place];  Service: Orthopedics;  Laterality: Right;  . I&D EXTREMITY Right 09/15/2014   Procedure: IRRIGATION AND DEBRIDEMENT  Ankle;  Surgeon: Renette Butters, MD;  Location: [place];  Service: Orthopedics;  Laterality: Right;  . INGUINAL HERNIA REPAIR Bilateral   . ORIF CALCANEOUS FRACTURE Right 09/19/2014   Procedure: OPEN REDUCTION INTERNAL FIXATION (ORIF) CALCANEOUS FRACTURE;  Surgeon: Newt Minion, MD;  Location: [place];  Service: Orthopedics;  Laterality: Right;  . ORIF CALCANEOUS FRACTURE Left 09/19/2014   Procedure: OPEN REDUCTION INTERNAL FIXATION (ORIF) CALCANEOUS FRACTURE;  Surgeon: Newt Minion, MD;  Location: [place];  Service: Orthopedics;  Laterality: Left;    There were no vitals filed for this visit.      Subjective Assessment - 01/22/17 1303    Subjective Pt reports that he is going back to his doctor today right after his session. He states that he has been having a pain in his butt and then goes into the history of his issues with therapy and increased pain several weeks ago. He continues to be sore and is unable to work it out. He states he has to sleep sitting up in the chair. He states he has been doing his exercises at home and they help in the moment but don't help overall.    Pertinent History h/o lumbar fusion (2005, 2008, 2016), bil ankle surgery s/p fracture in 2016,    Limitations Lifting;Standing;Walking;House hold activities   How long can you sit comfortably? an hour   How  long can you stand comfortably? 30 mins   How long can you walk comfortably? maybe 2 hours   Patient Stated Goals less back pain and hip pain   Currently in Pain? Other (Comment)  yes in pain, no specific rating given   Pain Onset More than a month ago                         [facility] Adult PT Treatment/Exercise - 01/22/17 0001      Exercises   Exercises Lumbar     Lumbar Exercises: Stretches   Single Knee to Chest Stretch Limitations 10x5 sec hold on each   Double Knee to Chest Stretch Limitations 10x5 sec hold    Piriformis Stretch 5 reps;30 seconds   Piriformis Stretch Limitations supine;  Lt performed in FABER position     Lumbar Exercises: Prone   Other Prone Lumbar Exercises prone over 1 pillow x2 min      Manual Therapy   Manual Therapy Soft tissue mobilization   Manual therapy comments separate rest of session   Soft tissue mobilization STM Lt piriformis, Lt ITB                PT Education - 01/22/17 1310    Education provided Yes   Education Details technique with therex; reviewed eval goals; importance of continuing with HEP to allow time for improvements to be made   Person(s) Educated Patient   Methods Explanation;Verbal cues   Comprehension Verbalized understanding;Returned demonstration          PT Short Term Goals - 01/17/17 1155      PT SHORT TERM GOAL #1   Title Pt will be independent with HEP to decrease pain and improve overall function.   Time 3   Period Weeks   Status New     PT SHORT TERM GOAL #2   Title Pt will have improved 5xSTS to <20 seconds with no UE support from chair to demonstrate decreased pain and improved overall strength and function.   Baseline eval: 31 sec from chair with hands on thighs    Time 3   Period Weeks   Status New     PT SHORT TERM GOAL #3   Title Pt will have improved 3MWT by 154ft or > and requiring 1 rest break or < during to demonstrate improved overall function and improved tolerance to ambulation.   Baseline eval: 362ft with 2 standing and 1 seated break   Time 3   Period Weeks   Status New     PT SHORT TERM GOAL #4   Title Pt will report being able to sleep through the night with 1 to no awakenings due to LBP to maximize recovery and improve overall function.   Time 3   Period Weeks   Status New           PT Long Term Goals - 01/17/17 1159      PT LONG TERM GOAL #1   Title Pt will have improved 5xSTS to <10 seconds from chair with no UE support to demonstrate improved overall power, strength, and function.    Time 6   Period Weeks   Status New     PT LONG TERM GOAL #2   Title  Pt will have improved overall MMT to at least 4+/5 throughout BLE to demonstrate improved strength in order to maximize gait and function at home and in the community.   Time 6  Period Weeks   Status New     PT LONG TERM GOAL #3   Title Pt will have improved lumbar ROM by at least 50% with minimal to no pain in order to demonstrate improved overall function and maximize return to PLOF.   Time 6   Period Weeks   Status New     PT LONG TERM GOAL #4   Title Pt will report being able to stand for 1 hour or > to maximize his ability to cook, wash dishes, and feed his animals, requiring 2 rest breaks or < to demonstrate improved toleranace for extension-based activities and maximize function at home.    Time 6   Period Weeks   Status New     PT LONG TERM GOAL #5   Title Pt will have improved bil SLS to at least 30 seconds or > on firm surface to maximize gait and demonstrate improved overall strength, balance, and function.   Baseline eval: R 4.7 sec, L 16 sec   Time 6   Period Weeks   Status New               Plan - 01/22/17 1315    Clinical Impression Statement Pt arrived today reporting continued frustration with Lt buttock pain. Therapist educated pt on the purpose of physical therapy in improving muscle tightness and introducing movement as a gradual process. Reviewed several exercises from his HEP with pt largely pain limited with slow transitions between exercises. Ended session with soft tissue mobilization to increase Lt gluteal pain, pt reporting no specific decrease in pain rating, however there was notable improvement in his sitting tolerance and reported improvements in discomfort compared to the beginning of the session.   Rehab Potential Fair   PT Frequency 2x / week   PT Duration 6 weeks   PT Treatment/Interventions ADLs/Self Care Home Management;Traction;Gait training;Stair training;Functional mobility training;Therapeutic activities;Therapeutic exercise;Balance  training;Neuromuscular re-education;Patient/family education;Manual techniques;Passive range of motion;Dry needling;Taping   PT Next Visit Plan piriformis stretch, STM along Lt glute region, ITB; prone without pillows if able   PT Home Exercise Plan eval: SKTC, DKTC, L piriformis stretch   Consulted and Agree with Plan of Care Patient      Patient will benefit from skilled therapeutic intervention in order to improve the following deficits and impairments:  Abnormal gait, Decreased activity tolerance, Decreased balance, Decreased endurance, Decreased mobility, Decreased range of motion, Decreased strength, Difficulty walking, Hypomobility, Impaired flexibility, Improper body mechanics, Postural dysfunction, Pain  Visit Diagnosis: Chronic bilateral low back pain with left-sided sciatica  Difficulty in walking, not elsewhere classified  Muscle weakness (generalized)  Other symptoms and signs involving the musculoskeletal system     Problem List Patient Active Problem List   Diagnosis Date Noted  . Fracture of L2 vertebra ([place]) 01/12/2015  . Acute osteomyelitis of calcaneum ([place]) 10/09/2014  . Dysuria 10/05/2014  . Cellulitis 10/05/2014  . Fall from ladder 09/21/2014  . L2 vertebral fracture ([place]) 09/21/2014  . Bilateral calcaneal fractures 09/21/2014  . Chronic pain 09/21/2014  . Acute blood loss anemia 09/21/2014  . Open right calcaneal fracture 09/15/2014   3:20 PM,01/22/17 Elly Modena PT, DPT [facility] [address] [place], [place], [address] Phone: [phone]   Fax:  [phone]  Name: Bradley Espinoza MRN: [id number] Date of Birth: 07-Jul-1956

## 2017-01-25 ENCOUNTER — Ambulatory Visit (HOSPITAL_COMMUNITY): Payer: PPO

## 2017-01-25 DIAGNOSIS — R262 Difficulty in walking, not elsewhere classified: Secondary | ICD-10-CM

## 2017-01-25 DIAGNOSIS — M6281 Muscle weakness (generalized): Secondary | ICD-10-CM

## 2017-01-25 DIAGNOSIS — M5442 Lumbago with sciatica, left side: Principal | ICD-10-CM

## 2017-01-25 DIAGNOSIS — R29898 Other symptoms and signs involving the musculoskeletal system: Secondary | ICD-10-CM

## 2017-01-25 DIAGNOSIS — G8929 Other chronic pain: Secondary | ICD-10-CM

## 2017-01-25 NOTE — Therapy (Signed)
[place], [place], [address] Phone: [phone]   Fax:  [phone]  Physical Therapy Treatment  Patient Details  Name: Bradley Espinoza MRN: [id number] Date of Birth: 05/10/1956 Referring Provider: Kary Kos, MD  Encounter Date: 01/25/2017      PT End of Session - 01/25/17 1240    Visit Number 3   Number of Visits 13   Date for PT Re-Evaluation 02/07/17   Authorization Type [employer]   Authorization Time Period 01/17/17 to 02/28/17   PT Start Time 1116   PT Stop Time 1202   PT Time Calculation (min) 46 min   Activity Tolerance Patient limited by pain   Behavior During Therapy [facility] for tasks assessed/performed      Past Medical History:  Diagnosis Date  . Anxiety   . Arthritis    low back pain, lumbar radiculopathy  . Depression   . Fracture    B/L ankles  . GERD (gastroesophageal reflux disease)   . Headache(784.0)    allergy related   . History of kidney stones   . History of stomach ulcers   . Retained orthopedic hardware    failed retained hardware right foot  . Wears glasses     Past Surgical History:  Procedure Laterality Date  . ANKLE FUSION Right 05/11/2015   Procedure: Right Posterior Arthroscopic Subtalar Arthrodesis;  Surgeon: Newt Minion, MD;  Location: [place];  Service: Orthopedics;  Laterality: Right;  . BACK SURGERY  2004   x 2  . CERVICAL SPINE SURGERY  2008  . ESOPHAGOGASTRODUODENOSCOPY    . HARDWARE REMOVAL Right 10/09/2014   Procedure: Removal Deep Hardware, Irrigation and Debridement Calcaneus, Place Antibiotic Beads and Wound VAC ;  Surgeon: Newt Minion, MD;  Location: [place];  Service: Orthopedics;  Laterality: Right;  . HARDWARE REMOVAL Right 08/12/2015   Procedure: Removal Deep Hardware Right Foot;  Surgeon: Newt Minion, MD;  Location: [place];  Service: Orthopedics;  Laterality: Right;  . I&D EXTREMITY Right 09/15/2014   Procedure: IRRIGATION AND DEBRIDEMENT  Ankle;  Surgeon: Renette Butters, MD;  Location: [place];  Service: Orthopedics;  Laterality: Right;  . INGUINAL HERNIA REPAIR Bilateral   . ORIF CALCANEOUS FRACTURE Right 09/19/2014   Procedure: OPEN REDUCTION INTERNAL FIXATION (ORIF) CALCANEOUS FRACTURE;  Surgeon: Newt Minion, MD;  Location: [place];  Service: Orthopedics;  Laterality: Right;  . ORIF CALCANEOUS FRACTURE Left 09/19/2014   Procedure: OPEN REDUCTION INTERNAL FIXATION (ORIF) CALCANEOUS FRACTURE;  Surgeon: Newt Minion, MD;  Location: [place];  Service: Orthopedics;  Laterality: Left;    There were no vitals filed for this visit.      Subjective Assessment - 01/25/17 1121    Subjective Pt reports that he went to the doctor after last session, got an injection and oral meds for the right posterior hip issue and it has decreased a bit, able to sleep in the bed for the first time, although it remains 9/10 today in the low back. The patient reports he has complete resolution of the hip on the left posterolateral pelvis/hip.    Pertinent History h/o lumbar fusion (2005, 2008, 2016), bil ankle surgery s/p fracture in 2016,    Pain Score 9   Central lower back, left sided more                         [facility] Adult PT Treatment/Exercise - 01/25/17  0001      Ambulation/Gait   Ambulation/Gait Yes   Ambulation Distance (Feet) 225 Feet   Gait velocity 0.46m/s   Gait Comments lacks hip extension on the left side byt ~20 degrees     Exercises   Exercises Lumbar     Lumbar Exercises: Stretches   Single Knee to Chest Stretch 3 reps;60 seconds  Contrlateral limb straight to stretch psoas    Hip Flexor Stretch 3 reps;60 seconds  supine Left hip at 0 degrees flexion; req CL hip flexion   Piriformis Stretch 3 reps;60 seconds  Left side P/ROM     Lumbar Exercises: Supine   Bent Knee Raise 15 reps  2x15: Left 90-120 degrees (psoas activation s groin synnergy      Other Intervention this session:  1. Release of left  psoas with hip extension stretch: 1x3 minutes  2. Glute max bridge in pain free range 2x15 (wide stance, >115 degree knee flexion) (positive recreation pain in left GT, but reported feels better actively)  3. Glute min release: MFR: x4 minutes 4. MFR Release of Left Adductor longus: x4 minutes Of note: deep palpation of left postorinferior greater trochanter with noted pain suspicious for irritation of piriformis/quadratus femoris attachment.             PT Short Term Goals - 01/17/17 1155      PT SHORT TERM GOAL #1   Title Pt will be independent with HEP to decrease pain and improve overall function.   Time 3   Period Weeks   Status New     PT SHORT TERM GOAL #2   Title Pt will have improved 5xSTS to <20 seconds with no UE support from chair to demonstrate decreased pain and improved overall strength and function.   Baseline eval: 31 sec from chair with hands on thighs    Time 3   Period Weeks   Status New     PT SHORT TERM GOAL #3   Title Pt will have improved 3MWT by 171ft or > and requiring 1 rest break or < during to demonstrate improved overall function and improved tolerance to ambulation.   Baseline eval: 386ft with 2 standing and 1 seated break   Time 3   Period Weeks   Status New     PT SHORT TERM GOAL #4   Title Pt will report being able to sleep through the night with 1 to no awakenings due to LBP to maximize recovery and improve overall function.   Time 3   Period Weeks   Status New           PT Long Term Goals - 01/17/17 1159      PT LONG TERM GOAL #1   Title Pt will have improved 5xSTS to <10 seconds from chair with no UE support to demonstrate improved overall power, strength, and function.    Time 6   Period Weeks   Status New     PT LONG TERM GOAL #2   Title Pt will have improved overall MMT to at least 4+/5 throughout BLE to demonstrate improved strength in order to maximize gait and function at home and in the community.   Time 6   Period  Weeks   Status New     PT LONG TERM GOAL #3   Title Pt will have improved lumbar ROM by at least 50% with minimal to no pain in order to demonstrate improved overall function and maximize return to PLOF.  Time 6   Period Weeks   Status New     PT LONG TERM GOAL #4   Title Pt will report being able to stand for 1 hour or > to maximize his ability to cook, wash dishes, and feed his animals, requiring 2 rest breaks or < to demonstrate improved toleranace for extension-based activities and maximize function at home.    Time 6   Period Weeks   Status New     PT LONG TERM GOAL #5   Title Pt will have improved bil SLS to at least 30 seconds or > on firm surface to maximize gait and demonstrate improved overall strength, balance, and function.   Baseline eval: R 4.7 sec, L 16 sec   Time 6   Period Weeks   Status New               Plan - 01/25/17 1240    Clinical Impression Statement Pt with decreased Left posterior trochanteric pain today, but with exacerbated pain across the lower back. Pt responding well to heavy manual therpay for MFR and P/ROM stretching this session to reduce pain in these areas, however remaining pain in left hip remains somewaht insidoious, somewhat resolved with avoidance of endrange flexion (curious for lumbar origination), but also resolved wtih active hip extension on that side which in correlation with respond to recent locaized injection would simply that the origin of the pain is indeed at the hip itself. Soft tissue work also to address suspected adhesion and irritation of Left sided lateral femoral cutaneous nerve. Pt with improved pain free hip extension at end of session, and 100% increase in hip lateral abduction and pain after MFR and stretching to left sided adductor longus. Able to perform bridging within a pain free compfortable range with activation of CC tissue at left hip, but reportedly with productive pain. Pt making progress toward goals overall.     Rehab Potential Fair   PT Frequency 2x / week   PT Duration 6 weeks   PT Treatment/Interventions ADLs/Self Care Home Management;Traction;Gait training;Stair training;Functional mobility training;Therapeutic activities;Therapeutic exercise;Balance training;Neuromuscular re-education;Patient/family education;Manual techniques;Passive range of motion;Dry needling;Taping   PT Next Visit Plan Continue to address soft tissue restrictions and spasm around left hip on 4 sides, as well as targets hip flexion strength  from 90*-end-range. Consider adding clam band to glute bridge.    PT Home Exercise Plan eval: SKTC, DKTC, L piriformis stretch; added in frog adductor stretch and glut emax bridge on 4/20   Consulted and Agree with Plan of Care Patient      Patient will benefit from skilled therapeutic intervention in order to improve the following deficits and impairments:  Abnormal gait, Decreased activity tolerance, Decreased balance, Decreased endurance, Decreased mobility, Decreased range of motion, Decreased strength, Difficulty walking, Hypomobility, Impaired flexibility, Improper body mechanics, Postural dysfunction, Pain  Visit Diagnosis: Chronic bilateral low back pain with left-sided sciatica  Difficulty in walking, not elsewhere classified  Muscle weakness (generalized)  Other symptoms and signs involving the musculoskeletal system     Problem List Patient Active Problem List   Diagnosis Date Noted  . Fracture of L2 vertebra ([place]) 01/12/2015  . Acute osteomyelitis of calcaneum ([place]) 10/09/2014  . Dysuria 10/05/2014  . Cellulitis 10/05/2014  . Fall from ladder 09/21/2014  . L2 vertebral fracture ([place]) 09/21/2014  . Bilateral calcaneal fractures 09/21/2014  . Chronic pain 09/21/2014  . Acute blood loss anemia 09/21/2014  . Open right calcaneal fracture 09/15/2014   12:48  PM, 01/25/17 Etta Grandchild, PT, DPT Physical Therapist at [facility] [phone] (office)     Etta Grandchild 01/25/2017, 12:47 PM  [place] [address] [place], [place], [address] Phone: [phone]   Fax:  [phone]  Name: RIDLEY SCHEWE MRN: [id number] Date of Birth: 28-Dec-1955

## 2017-01-29 ENCOUNTER — Ambulatory Visit (HOSPITAL_COMMUNITY): Payer: PPO | Admitting: Physical Therapy

## 2017-01-29 DIAGNOSIS — G8929 Other chronic pain: Secondary | ICD-10-CM

## 2017-01-29 DIAGNOSIS — M5442 Lumbago with sciatica, left side: Secondary | ICD-10-CM | POA: Diagnosis not present

## 2017-01-29 DIAGNOSIS — R29898 Other symptoms and signs involving the musculoskeletal system: Secondary | ICD-10-CM

## 2017-01-29 DIAGNOSIS — M6281 Muscle weakness (generalized): Secondary | ICD-10-CM

## 2017-01-29 DIAGNOSIS — R262 Difficulty in walking, not elsewhere classified: Secondary | ICD-10-CM
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
## 2017-01-29 NOTE — Patient Instructions (Addendum)
Piriformis Stretch (All-Fours)    With right leg crossed in front, slide other leg back, lowering hips until stretch is felt.  Hold for 60 seconds  Repeat _1___ times per set. Do _1___ sets per session. Do __1__ sessions per day.  http://orth.exer.us/292   Copyright  VHI. All rights reserved.  Hip Abduction / Adduction: with Knee Flexion (Supine)    With right knee bent, gently lower knee to side and return. Repeat __10__ times per set. Do __1__ sets per session. Do 2____ sessions per day.  http://orth.exer.us/682   Copyright  VHI. All rights reserved.  Isometric Abdominal    Lying on back with knees bent, tighten stomach by pressing elbows down. Hold __5__ seconds. Repeat ___10_ times per set. Do 1____ sets per session. Do __2__ sessions per day.  http://orth.exer.us/1086   Copyright  VHI. All rights reserved.

## 2017-02-01 ENCOUNTER — Ambulatory Visit (HOSPITAL_COMMUNITY): Payer: PPO

## 2017-02-01 DIAGNOSIS — R262 Difficulty in walking, not elsewhere classified: Secondary | ICD-10-CM

## 2017-02-01 DIAGNOSIS — M6281 Muscle weakness (generalized): Secondary | ICD-10-CM

## 2017-02-01 DIAGNOSIS — R29898 Other symptoms and signs involving the musculoskeletal system: Secondary | ICD-10-CM

## 2017-02-01 DIAGNOSIS — G8929 Other chronic pain: Secondary | ICD-10-CM

## 2017-02-01 DIAGNOSIS — M5442 Lumbago with sciatica, left side: Principal | ICD-10-CM

## 2017-02-01 NOTE — Therapy (Signed)
[place], [place], [address] Phone: [phone]   Fax:  [phone]  Physical Therapy Treatment  Patient Details  Name: Bradley Espinoza MRN: [id number] Date of Birth: November 18, 1955 Referring Provider: Kary Kos, MD  Encounter Date: 02/01/2017      PT End of Session - 02/01/17 1152    Visit Number 5   Number of Visits 13   Date for PT Re-Evaluation 02/07/17   Authorization Type [employer]   Authorization Time Period 01/17/17 to 02/28/17   Authorization - Visit Number 5   Authorization - Number of Visits 13   PT Start Time 4097   PT Stop Time 1200   PT Time Calculation (min) 39 min   Activity Tolerance Patient limited by pain   Behavior During Therapy [facility] for tasks assessed/performed      Past Medical History:  Diagnosis Date  . Anxiety   . Arthritis    low back pain, lumbar radiculopathy  . Depression   . Fracture    B/L ankles  . GERD (gastroesophageal reflux disease)   . Headache(784.0)    allergy related   . History of kidney stones   . History of stomach ulcers   . Retained orthopedic hardware    failed retained hardware right foot  . Wears glasses     Past Surgical History:  Procedure Laterality Date  . ANKLE FUSION Right 05/11/2015   Procedure: Right Posterior Arthroscopic Subtalar Arthrodesis;  Surgeon: Newt Minion, MD;  Location: [place];  Service: Orthopedics;  Laterality: Right;  . BACK SURGERY  2004   x 2  . CERVICAL SPINE SURGERY  2008  . ESOPHAGOGASTRODUODENOSCOPY    . HARDWARE REMOVAL Right 10/09/2014   Procedure: Removal Deep Hardware, Irrigation and Debridement Calcaneus, Place Antibiotic Beads and Wound VAC ;  Surgeon: Newt Minion, MD;  Location: [place];  Service: Orthopedics;  Laterality: Right;  . HARDWARE REMOVAL Right 08/12/2015   Procedure: Removal Deep Hardware Right Foot;  Surgeon: Newt Minion, MD;  Location: [place];  Service: Orthopedics;  Laterality: Right;  .  I&D EXTREMITY Right 09/15/2014   Procedure: IRRIGATION AND DEBRIDEMENT Ankle;  Surgeon: Renette Butters, MD;  Location: [place];  Service: Orthopedics;  Laterality: Right;  . INGUINAL HERNIA REPAIR Bilateral   . ORIF CALCANEOUS FRACTURE Right 09/19/2014   Procedure: OPEN REDUCTION INTERNAL FIXATION (ORIF) CALCANEOUS FRACTURE;  Surgeon: Newt Minion, MD;  Location: [place];  Service: Orthopedics;  Laterality: Right;  . ORIF CALCANEOUS FRACTURE Left 09/19/2014   Procedure: OPEN REDUCTION INTERNAL FIXATION (ORIF) CALCANEOUS FRACTURE;  Surgeon: Newt Minion, MD;  Location: [place];  Service: Orthopedics;  Laterality: Left;    There were no vitals filed for this visit.                       [place] Adult PT Treatment/Exercise - 02/01/17 0001      Ambulation/Gait   Ambulation/Gait Yes   Ambulation Distance (Feet) 450 Feet   Gait velocity 0.61m/s      Lumbar Exercises: Stretches   Piriformis Stretch 60 seconds;3 reps  Frog Adductor stretch supine: 3x60sec   Piriformis Stretch Limitations Supine Psoas Stretch: 3x60sec bilat left   contrlateral hip/knee flexed     Lumbar Exercises: Supine   Clam 10 reps  3x10- ball between feet Green TB; hips flexed @ 60*   Heel Slides Limitations Left coronal heel slides: LLE   3x10-  5lb   Bent Knee Raise 20 reps;Other (comment)  maximal knee flexion at start: hip from 75-130 degrees   Bridge --  Glute Max Bridge: 3x15   Other Supine Lumbar Exercises Adductor magnus stretch left: 1x60sec (hamstring portion)   Manual relase to adductor magnus (external rotation portion)                  PT Short Term Goals - 01/17/17 1155      PT SHORT TERM GOAL #1   Title Pt will be independent with HEP to decrease pain and improve overall function.   Time 3   Period Weeks   Status New     PT SHORT TERM GOAL #2   Title Pt will have improved 5xSTS to <20 seconds with no UE support from chair to demonstrate decreased pain and improved  overall strength and function.   Baseline eval: 31 sec from chair with hands on thighs    Time 3   Period Weeks   Status New     PT SHORT TERM GOAL #3   Title Pt will have improved 3MWT by 14ft or > and requiring 1 rest break or < during to demonstrate improved overall function and improved tolerance to ambulation.   Baseline eval: 361ft with 2 standing and 1 seated break   Time 3   Period Weeks   Status New     PT SHORT TERM GOAL #4   Title Pt will report being able to sleep through the night with 1 to no awakenings due to LBP to maximize recovery and improve overall function.   Time 3   Period Weeks   Status New           PT Long Term Goals - 01/17/17 1159      PT LONG TERM GOAL #1   Title Pt will have improved 5xSTS to <10 seconds from chair with no UE support to demonstrate improved overall power, strength, and function.    Time 6   Period Weeks   Status New     PT LONG TERM GOAL #2   Title Pt will have improved overall MMT to at least 4+/5 throughout BLE to demonstrate improved strength in order to maximize gait and function at home and in the community.   Time 6   Period Weeks   Status New     PT LONG TERM GOAL #3   Title Pt will have improved lumbar ROM by at least 50% with minimal to no pain in order to demonstrate improved overall function and maximize return to PLOF.   Time 6   Period Weeks   Status New     PT LONG TERM GOAL #4   Title Pt will report being able to stand for 1 hour or > to maximize his ability to cook, wash dishes, and feed his animals, requiring 2 rest breaks or < to demonstrate improved toleranace for extension-based activities and maximize function at home.    Time 6   Period Weeks   Status New     PT LONG TERM GOAL #5   Title Pt will have improved bil SLS to at least 30 seconds or > on firm surface to maximize gait and demonstrate improved overall strength, balance, and function.   Baseline eval: R 4.7 sec, L 16 sec   Time 6    Period Weeks   Status New               Plan -  02/01/17 1152    Clinical Impression Statement Pt demonstrating excellent improvement in psosas lengthening, tolerance to psoas stretching, and improve stregth/tolernace to active ROM. TIghtness in adductor magnus noted in both portions on left side, which responds well to stretchings and manual relase. Pt making excellent progress toward goals overall. Added groin strethc to HEP to address adductor brevis/longus.    PT Frequency 2x / week   PT Duration 6 weeks   PT Treatment/Interventions ADLs/Self Care Home Management;Traction;Gait training;Stair training;Functional mobility training;Therapeutic activities;Therapeutic exercise;Balance training;Neuromuscular re-education;Patient/family education;Manual techniques;Passive range of motion;Dry needling;Taping   PT Next Visit Plan Session start with gentle stretchign lengthening of Left psoas, left high groin, and left long adductors. followed by strenghtening of all. MFR to posterior attachment of add magnus at femur.    PT Home Exercise Plan eval: SKTC, DKTC, L piriformis stretch; added in frog adductor stretch and glute max bridge on 4/20. 4/27 added in groin stretch.    Consulted and Agree with Plan of Care Patient      Patient will benefit from skilled therapeutic intervention in order to improve the following deficits and impairments:  Abnormal gait, Decreased activity tolerance, Decreased balance, Decreased endurance, Decreased mobility, Decreased range of motion, Decreased strength, Difficulty walking, Hypomobility, Impaired flexibility, Improper body mechanics, Postural dysfunction, Pain  Visit Diagnosis: Chronic bilateral low back pain with left-sided sciatica  Difficulty in walking, not elsewhere classified  Muscle weakness (generalized)  Other symptoms and signs involving the musculoskeletal system     Problem List Patient Active Problem List   Diagnosis Date Noted  .  Fracture of L2 vertebra ([place]) 01/12/2015  . Acute osteomyelitis of calcaneum ([place]) 10/09/2014  . Dysuria 10/05/2014  . Cellulitis 10/05/2014  . Fall from ladder 09/21/2014  . L2 vertebral fracture ([place]) 09/21/2014  . Bilateral calcaneal fractures 09/21/2014  . Chronic pain 09/21/2014  . Acute blood loss anemia 09/21/2014  . Open right calcaneal fracture 09/15/2014    12:03 PM, 02/01/17 Etta Grandchild, PT, DPT Physical Therapist at [place] [phone] (office)     [facility] [address] [place], [place], [address] Phone: [phone]   Fax:  [phone]  Name: Bradley Espinoza MRN: [id number] Date of Birth: 08/10/1956

## 2017-02-05 ENCOUNTER — Ambulatory Visit (HOSPITAL_COMMUNITY): Payer: PPO | Attending: Neurosurgery | Admitting: Physical Therapy

## 2017-02-05 DIAGNOSIS — Z5181 Encounter for therapeutic drug level monitoring: Secondary | ICD-10-CM | POA: Diagnosis not present

## 2017-02-05 DIAGNOSIS — R030 Elevated blood-pressure reading, without diagnosis of hypertension: Secondary | ICD-10-CM | POA: Diagnosis not present

## 2017-02-05 DIAGNOSIS — R262 Difficulty in walking, not elsewhere classified: Secondary | ICD-10-CM | POA: Diagnosis not present

## 2017-02-05 DIAGNOSIS — Z79891 Long term (current) use of opiate analgesic: Secondary | ICD-10-CM | POA: Diagnosis not present

## 2017-02-05 DIAGNOSIS — M5442 Lumbago with sciatica, left side: Secondary | ICD-10-CM | POA: Diagnosis not present

## 2017-02-05 DIAGNOSIS — G8929 Other chronic pain: Secondary | ICD-10-CM | POA: Diagnosis not present

## 2017-02-05 DIAGNOSIS — R29898 Other symptoms and signs involving the musculoskeletal system: Secondary | ICD-10-CM | POA: Diagnosis not present

## 2017-02-05 DIAGNOSIS — Z79899 Other long term (current) drug therapy: Secondary | ICD-10-CM | POA: Diagnosis not present

## 2017-02-05 DIAGNOSIS — M5416 Radiculopathy, lumbar region: Secondary | ICD-10-CM | POA: Diagnosis not present

## 2017-02-05 DIAGNOSIS — M6281 Muscle weakness (generalized): Secondary | ICD-10-CM | POA: Diagnosis not present

## 2017-02-05 NOTE — Therapy (Signed)
[place], [place], [address] Phone: [phone]   Fax:  [phone]  Physical Therapy Treatment  Patient Details  Name: Bradley Espinoza MRN: [id number] Date of Birth: January 31, 1956 Referring Provider: Kary Kos, MD  Encounter Date: 02/05/2017      PT End of Session - 02/05/17 5397    Visit Number 6   Number of Visits 13   Date for PT Re-Evaluation 02/07/17   Authorization Type [employer]   Authorization Time Period 01/17/17 to 02/28/17   Authorization - Visit Number 6   Authorization - Number of Visits 13   PT Start Time 6734   PT Stop Time 1728   PT Time Calculation (min) 40 min   Activity Tolerance Patient limited by pain   Behavior During Therapy [facility] for tasks assessed/performed      Past Medical History:  Diagnosis Date  . Anxiety   . Arthritis    low back pain, lumbar radiculopathy  . Depression   . Fracture    B/L ankles  . GERD (gastroesophageal reflux disease)   . Headache(784.0)    allergy related   . History of kidney stones   . History of stomach ulcers   . Retained orthopedic hardware    failed retained hardware right foot  . Wears glasses     Past Surgical History:  Procedure Laterality Date  . ANKLE FUSION Right 05/11/2015   Procedure: Right Posterior Arthroscopic Subtalar Arthrodesis;  Surgeon: Newt Minion, MD;  Location: [place];  Service: Orthopedics;  Laterality: Right;  . BACK SURGERY  2004   x 2  . CERVICAL SPINE SURGERY  2008  . ESOPHAGOGASTRODUODENOSCOPY    . HARDWARE REMOVAL Right 10/09/2014   Procedure: Removal Deep Hardware, Irrigation and Debridement Calcaneus, Place Antibiotic Beads and Wound VAC ;  Surgeon: Newt Minion, MD;  Location: [place];  Service: Orthopedics;  Laterality: Right;  . HARDWARE REMOVAL Right 08/12/2015   Procedure: Removal Deep Hardware Right Foot;  Surgeon: Newt Minion, MD;  Location: [place];  Service: Orthopedics;  Laterality: Right;  .  I&D EXTREMITY Right 09/15/2014   Procedure: IRRIGATION AND DEBRIDEMENT Ankle;  Surgeon: Renette Butters, MD;  Location: [place];  Service: Orthopedics;  Laterality: Right;  . INGUINAL HERNIA REPAIR Bilateral   . ORIF CALCANEOUS FRACTURE Right 09/19/2014   Procedure: OPEN REDUCTION INTERNAL FIXATION (ORIF) CALCANEOUS FRACTURE;  Surgeon: Newt Minion, MD;  Location: [place];  Service: Orthopedics;  Laterality: Right;  . ORIF CALCANEOUS FRACTURE Left 09/19/2014   Procedure: OPEN REDUCTION INTERNAL FIXATION (ORIF) CALCANEOUS FRACTURE;  Surgeon: Newt Minion, MD;  Location: [place];  Service: Orthopedics;  Laterality: Left;    There were no vitals filed for this visit.      Subjective Assessment - 02/05/17 1700    Subjective PT states he has 8/10 in Rt hip and 9/10 in his Lt hip.  States his back also hurts with contstant throbbing.    Currently in Pain? Yes   Pain Score 9    Pain Location Generalized   Pain Orientation Right;Left;Lower   Pain Descriptors / Indicators Aching;Sharp;Burning                         [facility] Adult PT Treatment/Exercise - 02/05/17 0001      Lumbar Exercises: Stretches   Active Hamstring Stretch 3 reps;30 seconds   Active Hamstring Stretch Limitations supine with UE assist  Piriformis Stretch 60 seconds   Piriformis Stretch Limitations seated     Lumbar Exercises: Standing   Other Standing Lumbar Exercises gait working on erect posturing     Lumbar Exercises: Seated   Sit to Stand 5 reps   Sit to Stand Limitations no UE's with fully erect posturing with standing     Lumbar Exercises: Sidelying   Clam 10 reps   Clam Limitations bilaterally     Lumbar Exercises: Prone   Other Prone Lumbar Exercises prone over 1 pillow x2 min    Other Prone Lumbar Exercises heelsqueeze 10 reps     Manual Therapy   Manual Therapy Myofascial release;Soft tissue mobilization   Manual therapy comments separate rest of session   Soft tissue mobilization tight  hip flexors bilaterally   Myofascial Release piriformis release Lt                   PT Short Term Goals - 01/17/17 1155      PT SHORT TERM GOAL #1   Title Pt will be independent with HEP to decrease pain and improve overall function.   Time 3   Period Weeks   Status New     PT SHORT TERM GOAL #2   Title Pt will have improved 5xSTS to <20 seconds with no UE support from chair to demonstrate decreased pain and improved overall strength and function.   Baseline eval: 31 sec from chair with hands on thighs    Time 3   Period Weeks   Status New     PT SHORT TERM GOAL #3   Title Pt will have improved 3MWT by 116ft or > and requiring 1 rest break or < during to demonstrate improved overall function and improved tolerance to ambulation.   Baseline eval: 324ft with 2 standing and 1 seated break   Time 3   Period Weeks   Status New     PT SHORT TERM GOAL #4   Title Pt will report being able to sleep through the night with 1 to no awakenings due to LBP to maximize recovery and improve overall function.   Time 3   Period Weeks   Status New           PT Long Term Goals - 01/17/17 1159      PT LONG TERM GOAL #1   Title Pt will have improved 5xSTS to <10 seconds from chair with no UE support to demonstrate improved overall power, strength, and function.    Time 6   Period Weeks   Status New     PT LONG TERM GOAL #2   Title Pt will have improved overall MMT to at least 4+/5 throughout BLE to demonstrate improved strength in order to maximize gait and function at home and in the community.   Time 6   Period Weeks   Status New     PT LONG TERM GOAL #3   Title Pt will have improved lumbar ROM by at least 50% with minimal to no pain in order to demonstrate improved overall function and maximize return to PLOF.   Time 6   Period Weeks   Status New     PT LONG TERM GOAL #4   Title Pt will report being able to stand for 1 hour or > to maximize his ability to cook, wash  dishes, and feed his animals, requiring 2 rest breaks or < to demonstrate improved toleranace for extension-based activities and maximize function  at home.    Time 6   Period Weeks   Status New     PT LONG TERM GOAL #5   Title Pt will have improved bil SLS to at least 30 seconds or > on firm surface to maximize gait and demonstrate improved overall strength, balance, and function.   Baseline eval: R 4.7 sec, L 16 sec   Time 6   Period Weeks   Status New               Plan - 02/05/17 1750    Clinical Impression Statement Pt with pain limiting good release in piriformis and achieving good stretch.  Attempted piriformis release on Lt, however patient too hyperreactive.  Pt unable to maintain any position for extended time due to discomfort.  Was able to add sidelying clams and prone heel squeezes.  Attmepted hip extension, however pateint is way too weak to complete this.   Educated on imporatnce of completing therex at home and spending time in prone postioning to stretch out hip flexors and tight forward bent posturing.  Pt with no change in pain at end of session.    PT Frequency 2x / week   PT Duration 6 weeks   PT Treatment/Interventions ADLs/Self Care Home Management;Traction;Gait training;Stair training;Functional mobility training;Therapeutic activities;Therapeutic exercise;Balance training;Neuromuscular re-education;Patient/family education;Manual techniques;Passive range of motion;Dry needling;Taping   PT Next Visit Plan Continue to encourage more extension posturing as extreme forward bent.  Manual therapy as needed and strengthening to glutes.   PT Home Exercise Plan eval: SKTC, DKTC, L piriformis stretch; added in frog adductor stretch and glute max bridge on 4/20. 4/27 added in groin stretch.    Consulted and Agree with Plan of Care Patient      Patient will benefit from skilled therapeutic intervention in order to improve the following deficits and impairments:  Abnormal  gait, Decreased activity tolerance, Decreased balance, Decreased endurance, Decreased mobility, Decreased range of motion, Decreased strength, Difficulty walking, Hypomobility, Impaired flexibility, Improper body mechanics, Postural dysfunction, Pain  Visit Diagnosis: Chronic bilateral low back pain with left-sided sciatica  Difficulty in walking, not elsewhere classified  Muscle weakness (generalized)  Other symptoms and signs involving the musculoskeletal system     Problem List Patient Active Problem List   Diagnosis Date Noted  . Fracture of L2 vertebra ([place]) 01/12/2015  . Acute osteomyelitis of calcaneum ([place]) 10/09/2014  . Dysuria 10/05/2014  . Cellulitis 10/05/2014  . Fall from ladder 09/21/2014  . L2 vertebral fracture ([place]) 09/21/2014  . Bilateral calcaneal fractures 09/21/2014  . Chronic pain 09/21/2014  . Acute blood loss anemia 09/21/2014  . Open right calcaneal fracture 09/15/2014    Teena Irani, PTA/CLT [phone]  02/05/2017, 5:57 PM  [facility] [address] [place], [place], [address] Phone: [phone]   Fax:  [phone]  Name: Bradley Espinoza MRN: [id number] Date of Birth: [birth year]

## 2017-02-08 ENCOUNTER — Ambulatory Visit (HOSPITAL_COMMUNITY): Payer: PPO

## 2017-02-08 DIAGNOSIS — M5442 Lumbago with sciatica, left side: Secondary | ICD-10-CM | POA: Diagnosis not present

## 2017-02-08 DIAGNOSIS — G8929 Other chronic pain: Secondary | ICD-10-CM

## 2017-02-08 DIAGNOSIS — R262 Difficulty in walking, not elsewhere classified: Secondary | ICD-10-CM

## 2017-02-08 DIAGNOSIS — R29898 Other symptoms and signs involving the musculoskeletal system: Secondary | ICD-10-CM

## 2017-02-08 DIAGNOSIS — M6281 Muscle weakness (generalized): Secondary | ICD-10-CM

## 2017-02-08 NOTE — Therapy (Signed)
[employer] [place], [place], [address] Phone: [phone]   Fax:  [phone]  Physical Therapy Treatment/Reassessment  Patient Details  Name: Bradley Espinoza MRN: [id number] Date of Birth: 06/30/1956 Referring Provider: Kary Kos, MD  Encounter Date: 02/08/2017      PT End of Session - 02/08/17 1123    Visit Number 7   Number of Visits 13   Date for PT Re-Evaluation 03/01/17   Authorization Type [employer]   Authorization Time Period 01/17/17 to 02/28/17   Authorization - Visit Number 7   Authorization - Number of Visits 13   PT Start Time 1120   PT Stop Time 1200   PT Time Calculation (min) 40 min   Activity Tolerance Patient limited by pain   Behavior During Therapy [facility] for tasks assessed/performed      Past Medical History:  Diagnosis Date  . Anxiety   . Arthritis    low back pain, lumbar radiculopathy  . Depression   . Fracture    B/L ankles  . GERD (gastroesophageal reflux disease)   . Headache(784.0)    allergy related   . History of kidney stones   . History of stomach ulcers   . Retained orthopedic hardware    failed retained hardware right foot  . Wears glasses     Past Surgical History:  Procedure Laterality Date  . ANKLE FUSION Right 05/11/2015   Procedure: Right Posterior Arthroscopic Subtalar Arthrodesis;  Surgeon: Newt Minion, MD;  Location: [place];  Service: Orthopedics;  Laterality: Right;  . BACK SURGERY  2004   x 2  . CERVICAL SPINE SURGERY  2008  . ESOPHAGOGASTRODUODENOSCOPY    . HARDWARE REMOVAL Right 10/09/2014   Procedure: Removal Deep Hardware, Irrigation and Debridement Calcaneus, Place Antibiotic Beads and Wound VAC ;  Surgeon: Newt Minion, MD;  Location: [place];  Service: Orthopedics;  Laterality: Right;  . HARDWARE REMOVAL Right 08/12/2015   Procedure: Removal Deep Hardware Right Foot;  Surgeon: Newt Minion, MD;  Location: [place];  Service: Orthopedics;   Laterality: Right;  . I&D EXTREMITY Right 09/15/2014   Procedure: IRRIGATION AND DEBRIDEMENT Ankle;  Surgeon: Renette Butters, MD;  Location: [place];  Service: Orthopedics;  Laterality: Right;  . INGUINAL HERNIA REPAIR Bilateral   . ORIF CALCANEOUS FRACTURE Right 09/19/2014   Procedure: OPEN REDUCTION INTERNAL FIXATION (ORIF) CALCANEOUS FRACTURE;  Surgeon: Newt Minion, MD;  Location: [place];  Service: Orthopedics;  Laterality: Right;  . ORIF CALCANEOUS FRACTURE Left 09/19/2014   Procedure: OPEN REDUCTION INTERNAL FIXATION (ORIF) CALCANEOUS FRACTURE;  Surgeon: Newt Minion, MD;  Location: [place];  Service: Orthopedics;  Laterality: Left;    There were no vitals filed for this visit.      Subjective Assessment - 02/08/17 1121    Subjective Pt reports some L hip pain this date. If he is sitting down it is a 7/10 and if he is standing it is an 8.   Currently in Pain? Yes   Pain Score 7    Pain Location Hip   Pain Orientation Left   Pain Descriptors / Indicators Stabbing   Pain Type Chronic pain   Pain Radiating Towards going down LLE   Pain Onset More than a month ago   Pain Frequency Constant   Aggravating Factors  lying down   Pain Relieving Factors recline with one knee pulled to his chin   Effect of Pain on Daily  Activities increases            [facility] PT Assessment - 02/08/17 0001      AROM   Lumbar Extension 75% limited, improvement from initial eval  painful throughout   Lumbar - Right Side Bend fingertips 2" below head of fibula  pain at end range   Lumbar - Left Side Bend fingertips to head of fibula  pain at end range     Strength   Right Hip Flexion 5/5  was 5   Right Hip Extension 4-/5  was 3+   Right Hip ABduction 5/5  was 4+   Left Hip Flexion 4+/5  was 4-   Left Hip Extension 2+/5  was 2+   Left Hip ABduction 4+/5  was 4   Right Knee Flexion 5/5  was 4+   Left Knee Flexion 4+/5  was 4   Right Ankle Dorsiflexion 5/5  was 4+   Left Ankle  Dorsiflexion 4+/5  was 4     Flexibility   Quadriceps +Ely's, L>R     Ambulation/Gait   Ambulation/Gait Yes   Ambulation Distance (Feet) 640 Feet  3MWT   Gait velocity 1.08 m/s   avg speed over the 3MWT     Static Standing Balance   Static Standing Balance -  Activities  Single Leg Stance - Right Leg;Single Leg Stance - Left Leg   Static Standing - Comment/# of Minutes R: 12 seconds but unsteady throughout and limited due to R ankle pain L: 13 seconds     Standardized Balance Assessment   Five times sit to stand comments  21 seconds from chair without UE support           [facility] Adult PT Treatment/Exercise - 02/08/17 0001      Lumbar Exercises: Sidelying   Other Sidelying Lumbar Exercises isometric clamshells LLE only, 2 x 10 with 5 sec holds             PT Education - 02/08/17 1204    Education provided Yes   Education Details reassessment findings, continue HEP    Person(s) Educated Patient   Methods Explanation;Demonstration   Comprehension Verbalized understanding;Returned demonstration          PT Short Term Goals - 02/08/17 1126      PT SHORT TERM GOAL #1   Title Pt will be independent with HEP to decrease pain and improve overall function.   Time 3   Period Weeks   Status Achieved     PT SHORT TERM GOAL #2   Title Pt will have improved 5xSTS to <20 seconds with no UE support from chair to demonstrate decreased pain and improved overall strength and function.   Baseline 5/3: 21 seconds   Time 3   Period Weeks   Status On-going     PT SHORT TERM GOAL #3   Title Pt will have improved 3MWT by 119f or > and requiring 1 rest break or < during to demonstrate improved overall function and improved tolerance to ambulation.   Baseline 5/3: 6433fwith 1 standing and 1 sitting rest break   Time 3   Period Weeks   Status On-going     PT SHORT TERM GOAL #4   Title Pt will report being able to sleep through the night with 1 to no awakenings due to LBP to  maximize recovery and improve overall function.   Baseline 5/3: pt able to sleep in his bed now and for the last couple  of days he has only been awakened 1x in the night, but usually it is 3x/night   Time 3   Period Weeks   Status Partially Met           PT Long Term Goals - 02/08/17 1127      PT LONG TERM GOAL #1   Title Pt will have improved 5xSTS to <10 seconds from chair with no UE support to demonstrate improved overall power, strength, and function.    Baseline 5/3: 21 seconds   Time 6   Period Weeks   Status On-going     PT LONG TERM GOAL #2   Title Pt will have improved overall MMT to at least 4+/5 throughout BLE to demonstrate improved strength in order to maximize gait and function at home and in the community.   Time 6   Period Weeks   Status On-going     PT LONG TERM GOAL #3   Title Pt will have improved lumbar ROM by at least 50% with minimal to no pain in order to demonstrate improved overall function and maximize return to PLOF.   Time 6   Period Weeks   Status On-going     PT LONG TERM GOAL #4   Title Pt will report being able to stand for 1 hour or > to maximize his ability to cook, wash dishes, and feed his animals, requiring 2 rest breaks or < to demonstrate improved toleranace for extension-based activities and maximize function at home.    Baseline 5/3: Pt states that he can cook and wash dishes for longer periods of time with decreased rest breaks but he still has to sit down; he reports he'd have to rest 4 times if he had to stand for an hour   Time 6   Period Weeks   Status On-going     PT LONG TERM GOAL #5   Title Pt will have improved bil SLS to at least 30 seconds or > on firm surface to maximize gait and demonstrate improved overall strength, balance, and function.   Baseline 5/3: R: 12, L 13   Time 6   Period Weeks   Status On-going               Plan - 02/08/17 1204    Clinical Impression Statement PT reassessed pt's goals and  outcome measures this date. Pt has made significant progress since beginning therapy even though he has only met 1 and partially met 1 goal. Pt has made improvements in all of his outcome measures and he reports an improved tolerance to standing as he does not need as many rest breaks. Pt has made some improvements in lumbar extension ROM in standing as well. Overall, pt is making great progress towards goals and POC should be continued as planned.   PT Frequency 2x / week   PT Duration 6 weeks   PT Treatment/Interventions ADLs/Self Care Home Management;Traction;Gait training;Stair training;Functional mobility training;Therapeutic activities;Therapeutic exercise;Balance training;Neuromuscular re-education;Patient/family education;Manual techniques;Passive range of motion;Dry needling;Taping   PT Next Visit Plan Continue to encourage more extension posturing as extreme forward bent. continue stretching of adductors, manual to glutes PRN, strengthening of hip ER and glutes; trial prone on elbows    PT Home Exercise Plan eval: SKTC, DKTC, L piriformis stretch; added in frog adductor stretch and glute max bridge on 4/20. 4/27 added in groin stretch.    Consulted and Agree with Plan of Care Patient      Patient will  benefit from skilled therapeutic intervention in order to improve the following deficits and impairments:  Abnormal gait, Decreased activity tolerance, Decreased balance, Decreased endurance, Decreased mobility, Decreased range of motion, Decreased strength, Difficulty walking, Hypomobility, Impaired flexibility, Improper body mechanics, Postural dysfunction, Pain  Visit Diagnosis: Chronic bilateral low back pain with left-sided sciatica  Difficulty in walking, not elsewhere classified  Muscle weakness (generalized)  Other symptoms and signs involving the musculoskeletal system     Problem List Patient Active Problem List   Diagnosis Date Noted  . Fracture of L2 vertebra ([place])  01/12/2015  . Acute osteomyelitis of calcaneum ([place]) 10/09/2014  . Dysuria 10/05/2014  . Cellulitis 10/05/2014  . Fall from ladder 09/21/2014  . L2 vertebral fracture ([place]) 09/21/2014  . Bilateral calcaneal fractures 09/21/2014  . Chronic pain 09/21/2014  . Acute blood loss anemia 09/21/2014  . Open right calcaneal fracture 09/15/2014     Geraldine Solar PT, DPT   [facility] [address] [place], [place], [address] Phone: [phone]   Fax:  [phone]  Name: Bradley Espinoza MRN: [id number] Date of Birth: 12-21-1955

## 2017-02-12 ENCOUNTER — Ambulatory Visit (HOSPITAL_COMMUNITY): Payer: PPO

## 2017-02-12 ENCOUNTER — Encounter (HOSPITAL_COMMUNITY): Payer: Self-pay

## 2017-02-12 ENCOUNTER — Encounter (HOSPITAL_COMMUNITY): Payer: PPO | Admitting: Physical Therapy

## 2017-02-12 DIAGNOSIS — R262 Difficulty in walking, not elsewhere classified: Secondary | ICD-10-CM

## 2017-02-12 DIAGNOSIS — M6281 Muscle weakness (generalized): Secondary | ICD-10-CM

## 2017-02-12 DIAGNOSIS — G8929 Other chronic pain: Secondary | ICD-10-CM

## 2017-02-12 DIAGNOSIS — M5442 Lumbago with sciatica, left side: Secondary | ICD-10-CM | POA: Diagnosis not present

## 2017-02-12 DIAGNOSIS — R29898 Other symptoms and signs involving the musculoskeletal system: Secondary | ICD-10-CM

## 2017-02-12 NOTE — Therapy (Signed)
[facility] [place], [place], [address] Phone: [phone]   Fax:  [phone]  Physical Therapy Treatment  Patient Details  Name: Bradley Espinoza MRN: [id number] Date of Birth: 01-27-1956 Referring Provider: Kary Kos, MD  Encounter Date: 02/12/2017      PT End of Session - 02/12/17 0950    Visit Number 8   Number of Visits 13   Date for PT Re-Evaluation 03/01/17   Authorization Type [employer]   Authorization Time Period 01/17/17 to 02/28/17   Authorization - Visit Number 8   Authorization - Number of Visits 13   PT Start Time 0945   PT Stop Time 1028   PT Time Calculation (min) 43 min   Activity Tolerance Patient limited by pain   Behavior During Therapy [facility] for tasks assessed/performed      Past Medical History:  Diagnosis Date  . Anxiety   . Arthritis    low back pain, lumbar radiculopathy  . Depression   . Fracture    B/L ankles  . GERD (gastroesophageal reflux disease)   . Headache(784.0)    allergy related   . History of kidney stones   . History of stomach ulcers   . Retained orthopedic hardware    failed retained hardware right foot  . Wears glasses     Past Surgical History:  Procedure Laterality Date  . ANKLE FUSION Right 05/11/2015   Procedure: Right Posterior Arthroscopic Subtalar Arthrodesis;  Surgeon: Newt Minion, MD;  Location: [place];  Service: Orthopedics;  Laterality: Right;  . BACK SURGERY  2004   x 2  . CERVICAL SPINE SURGERY  2008  . ESOPHAGOGASTRODUODENOSCOPY    . HARDWARE REMOVAL Right 10/09/2014   Procedure: Removal Deep Hardware, Irrigation and Debridement Calcaneus, Place Antibiotic Beads and Wound VAC ;  Surgeon: Newt Minion, MD;  Location: [place];  Service: Orthopedics;  Laterality: Right;  . HARDWARE REMOVAL Right 08/12/2015   Procedure: Removal Deep Hardware Right Foot;  Surgeon: Newt Minion, MD;  Location: [place];  Service: Orthopedics;  Laterality: Right;  .  I&D EXTREMITY Right 09/15/2014   Procedure: IRRIGATION AND DEBRIDEMENT Ankle;  Surgeon: Renette Butters, MD;  Location: [place];  Service: Orthopedics;  Laterality: Right;  . INGUINAL HERNIA REPAIR Bilateral   . ORIF CALCANEOUS FRACTURE Right 09/19/2014   Procedure: OPEN REDUCTION INTERNAL FIXATION (ORIF) CALCANEOUS FRACTURE;  Surgeon: Newt Minion, MD;  Location: [facility];  Service: Orthopedics;  Laterality: Right;  . ORIF CALCANEOUS FRACTURE Left 09/19/2014   Procedure: OPEN REDUCTION INTERNAL FIXATION (ORIF) CALCANEOUS FRACTURE;  Surgeon: Newt Minion, MD;  Location: [place];  Service: Orthopedics;  Laterality: Left;    There were no vitals filed for this visit.      Subjective Assessment - 02/12/17 0947    Subjective Pt states he was making morter yesterday and he is having increased pain today. He states that he returns to his doctor next week.   Currently in Pain? Yes   Pain Score 9    Pain Location Hip   Pain Orientation Left   Pain Descriptors / Indicators Stabbing;Numbness;Throbbing   Pain Type Chronic pain   Pain Onset More than a month ago   Pain Frequency Constant   Aggravating Factors  lying down   Pain Relieving Factors recline with one knee pulled to his chin   Effect of Pain on Daily Activities increases  [facility] Adult PT Treatment/Exercise - 02/12/17 0001      Lumbar Exercises: Stretches   Active Hamstring Stretch 3 reps;30 seconds   Active Hamstring Stretch Limitations BLE, supine with sheet   Single Knee to Chest Stretch 1 rep  x 2 mins each   Single Knee to Chest Stretch Limitations BLE, supine with contralateral limb extended to stretch hip flexors   Pelvic Tilt Limitations supine adductor stretch x 2 mins total (frog stretch)     Manual Therapy   Manual Therapy Myofascial release;Soft tissue mobilization   Manual therapy comments separate rest of session   Soft tissue mobilization L mid to distal adductors, L lateral HS with put supine and with  LLE extended to facilitate hip flexor stretch simultaneously; L glutes and piriformis in sidelying              PT Education - 02/12/17 1144    Education provided Yes   Education Details educated on centralization, explained to pt how this will take time to recover from as he has been in pain for at least 2 years, showed pt an image of LE muscles and which ones we were stretching/performing manual therapy on to hopefully promote patient buy-in   Person(s) Educated Patient   Methods Explanation;Demonstration   Comprehension Verbalized understanding          PT Short Term Goals - 02/08/17 1126      PT SHORT TERM GOAL #1   Title Pt will be independent with HEP to decrease pain and improve overall function.   Time 3   Period Weeks   Status Achieved     PT SHORT TERM GOAL #2   Title Pt will have improved 5xSTS to <20 seconds with no UE support from chair to demonstrate decreased pain and improved overall strength and function.   Baseline 5/3: 21 seconds   Time 3   Period Weeks   Status On-going     PT SHORT TERM GOAL #3   Title Pt will have improved 3MWT by 169f or > and requiring 1 rest break or < during to demonstrate improved overall function and improved tolerance to ambulation.   Baseline 5/3: 6455fwith 1 standing and 1 sitting rest break   Time 3   Period Weeks   Status On-going     PT SHORT TERM GOAL #4   Title Pt will report being able to sleep through the night with 1 to no awakenings due to LBP to maximize recovery and improve overall function.   Baseline 5/3: pt able to sleep in his bed now and for the last couple of days he has only been awakened 1x in the night, but usually it is 3x/night   Time 3   Period Weeks   Status Partially Met           PT Long Term Goals - 02/08/17 1127      PT LONG TERM GOAL #1   Title Pt will have improved 5xSTS to <10 seconds from chair with no UE support to demonstrate improved overall power, strength, and function.     Baseline 5/3: 21 seconds   Time 6   Period Weeks   Status On-going     PT LONG TERM GOAL #2   Title Pt will have improved overall MMT to at least 4+/5 throughout BLE to demonstrate improved strength in order to maximize gait and function at home and in the community.   Time 6   Period Weeks  Status On-going     PT LONG TERM GOAL #3   Title Pt will have improved lumbar ROM by at least 50% with minimal to no pain in order to demonstrate improved overall function and maximize return to PLOF.   Time 6   Period Weeks   Status On-going     PT LONG TERM GOAL #4   Title Pt will report being able to stand for 1 hour or > to maximize his ability to cook, wash dishes, and feed his animals, requiring 2 rest breaks or < to demonstrate improved toleranace for extension-based activities and maximize function at home.    Baseline 5/3: Pt states that he can cook and wash dishes for longer periods of time with decreased rest breaks but he still has to sit down; he reports he'd have to rest 4 times if he had to stand for an hour   Time 6   Period Weeks   Status On-going     PT LONG TERM GOAL #5   Title Pt will have improved bil SLS to at least 30 seconds or > on firm surface to maximize gait and demonstrate improved overall strength, balance, and function.   Baseline 5/3: R: 12, L 13   Time 6   Period Weeks   Status On-going               Plan - 02/12/17 1150    Clinical Impression Statement Pt presented to therapy with c/o increased LB and L hip pain after he worked on [employer] yesterday (he was in a flexed position and pulling). Began session with stretching and then followed up with manual therapy to his adductors, lateral HS, glutes and piriformis. Pt reported that he could feel tingling in his L foot with palpation to L piriformis, which would subside when PT let off pressure. Pt also verbalized that he could feel his muscles relaxing with prolonged manual therapy but he rated his  pain as an 8/10 at EOS (9/10 at beginning). Pt verbalized that his pain was getting more intense at his proximal hip and LB; PT educated pt on centralization and how this was a good sign and indicated that he was making progress. Pt also asking whether his pain could be due to inflammation of his bones. PT explained that his pain could be recreated with palpation of these muscles that were treated during manual therapy and since his pain could be recreated, it is more likely that his pain is due to tightness of his hip flexors, adductors, HS, glutes, and piriformis. Continue to promote tolerance to extension gradually while encouraging pt buy-in.    PT Frequency 2x / week   PT Duration 6 weeks   PT Treatment/Interventions ADLs/Self Care Home Management;Traction;Gait training;Stair training;Functional mobility training;Therapeutic activities;Therapeutic exercise;Balance training;Neuromuscular re-education;Patient/family education;Manual techniques;Passive range of motion;Dry needling;Taping   PT Next Visit Plan Continue to encourage more extension posturing as extreme forward bent. continue stretching of adductors, manual to glutes PRN, strengthening of hip ER and glutes; trial prone on elbows; if pt is not in a lot of pain, attempted standing hip strengthening exercises, bil pulldowns with front foot elevated on 2" step, gait training   PT Home Exercise Plan eval: SKTC, DKTC, L piriformis stretch; added in frog adductor stretch and glute max bridge on 4/20. 4/27 added in groin stretch.    Consulted and Agree with Plan of Care Patient      Patient will benefit from skilled therapeutic intervention in order to  improve the following deficits and impairments:  Abnormal gait, Decreased activity tolerance, Decreased balance, Decreased endurance, Decreased mobility, Decreased range of motion, Decreased strength, Difficulty walking, Hypomobility, Impaired flexibility, Improper body mechanics, Postural dysfunction,  Pain  Visit Diagnosis: Chronic bilateral low back pain with left-sided sciatica  Difficulty in walking, not elsewhere classified  Muscle weakness (generalized)  Other symptoms and signs involving the musculoskeletal system     Problem List Patient Active Problem List   Diagnosis Date Noted  . Fracture of L2 vertebra ([place]) 01/12/2015  . Acute osteomyelitis of calcaneum ([place]) 10/09/2014  . Dysuria 10/05/2014  . Cellulitis 10/05/2014  . Fall from ladder 09/21/2014  . L2 vertebral fracture ([place]) 09/21/2014  . Bilateral calcaneal fractures 09/21/2014  . Chronic pain 09/21/2014  . Acute blood loss anemia 09/21/2014  . Open right calcaneal fracture 09/15/2014     Geraldine Solar PT, DPT   [facility] [address] [place], [place], [address] Phone: [phone]   Fax:  [phone]  Name: MARCQUES WRIGHTSMAN MRN: [id number] Date of Birth: 12-16-1955

## 2017-02-14 ENCOUNTER — Telehealth (HOSPITAL_COMMUNITY): Payer: Self-pay

## 2017-02-14 NOTE — Telephone Encounter (Signed)
Lm requesting pt cx and chnage 5/25 appointment  needs to be  rescheduled NF 02/14/17

## 2017-02-15 ENCOUNTER — Ambulatory Visit (HOSPITAL_COMMUNITY): Payer: PPO

## 2017-02-15 ENCOUNTER — Encounter (HOSPITAL_COMMUNITY): Payer: Self-pay

## 2017-02-15 DIAGNOSIS — M5442 Lumbago with sciatica, left side: Secondary | ICD-10-CM | POA: Diagnosis not present

## 2017-02-15 DIAGNOSIS — R29898 Other symptoms and signs involving the musculoskeletal system: Secondary | ICD-10-CM

## 2017-02-15 DIAGNOSIS — M6281 Muscle weakness (generalized): Secondary | ICD-10-CM

## 2017-02-15 DIAGNOSIS — R262 Difficulty in walking, not elsewhere classified: Secondary | ICD-10-CM

## 2017-02-15 DIAGNOSIS — G8929 Other chronic pain: Secondary | ICD-10-CM

## 2017-02-15 NOTE — Patient Instructions (Signed)
  Thomas Test Position Hip Flexor Stretch  Sit on the edge of a bed, grab your Right knee and lie back onto the bed in the position as shown in the picture.    Let your left leg hang over the edge of your bed.  Hold your right knee tight toward your chest to stabilize your spine.   Perform 1x/day, hold for 3-5 mins

## 2017-02-15 NOTE — Therapy (Signed)
[facility] [place], [place], [address] Phone: [phone]   Fax:  [phone]  Physical Therapy Treatment  Patient Details  Name: MURDOCK JELLISON MRN: [id number] Date of Birth: 1956-03-19 Referring Provider: Kary Kos, MD  Encounter Date: 02/15/2017      PT End of Session - 02/15/17 1046    Visit Number 9   Number of Visits 13   Date for PT Re-Evaluation 03/01/17   Authorization Type [employer]   Authorization Time Period 01/17/17 to 02/28/17   Authorization - Visit Number 9   Authorization - Number of Visits 13   PT Start Time 1046   PT Stop Time 1128   PT Time Calculation (min) 42 min   Activity Tolerance Patient limited by pain   Behavior During Therapy [facility] for tasks assessed/performed      Past Medical History:  Diagnosis Date  . Anxiety   . Arthritis    low back pain, lumbar radiculopathy  . Depression   . Fracture    B/L ankles  . GERD (gastroesophageal reflux disease)   . Headache(784.0)    allergy related   . History of kidney stones   . History of stomach ulcers   . Retained orthopedic hardware    failed retained hardware right foot  . Wears glasses     Past Surgical History:  Procedure Laterality Date  . ANKLE FUSION Right 05/11/2015   Procedure: Right Posterior Arthroscopic Subtalar Arthrodesis;  Surgeon: Newt Minion, MD;  Location: [place];  Service: Orthopedics;  Laterality: Right;  . BACK SURGERY  2004   x 2  . CERVICAL SPINE SURGERY  2008  . ESOPHAGOGASTRODUODENOSCOPY    . HARDWARE REMOVAL Right 10/09/2014   Procedure: Removal Deep Hardware, Irrigation and Debridement Calcaneus, Place Antibiotic Beads and Wound VAC ;  Surgeon: Newt Minion, MD;  Location: [place];  Service: Orthopedics;  Laterality: Right;  . HARDWARE REMOVAL Right 08/12/2015   Procedure: Removal Deep Hardware Right Foot;  Surgeon: Newt Minion, MD;  Location: [place];  Service: Orthopedics;  Laterality: Right;  .  I&D EXTREMITY Right 09/15/2014   Procedure: IRRIGATION AND DEBRIDEMENT Ankle;  Surgeon: Renette Butters, MD;  Location: [place];  Service: Orthopedics;  Laterality: Right;  . INGUINAL HERNIA REPAIR Bilateral   . ORIF CALCANEOUS FRACTURE Right 09/19/2014   Procedure: OPEN REDUCTION INTERNAL FIXATION (ORIF) CALCANEOUS FRACTURE;  Surgeon: Newt Minion, MD;  Location: [place];  Service: Orthopedics;  Laterality: Right;  . ORIF CALCANEOUS FRACTURE Left 09/19/2014   Procedure: OPEN REDUCTION INTERNAL FIXATION (ORIF) CALCANEOUS FRACTURE;  Surgeon: Newt Minion, MD;  Location: [place];  Service: Orthopedics;  Laterality: Left;    There were no vitals filed for this visit.      Subjective Assessment - 02/15/17 1046    Subjective Pt states he woke up sore this morning. He thinks his bed might be too hard and his neck is hruting him today as well.    Currently in Pain? Yes   Pain Score 8    Pain Location Hip   Pain Orientation Left   Pain Descriptors / Indicators Stabbing;Numbness;Throbbing   Pain Type Chronic pain   Pain Onset More than a month ago   Pain Frequency Constant   Aggravating Factors  lying down   Pain Relieving Factors recline with one knee pulled to his chin   Effect of Pain on Daily Activities increases  [place] Adult PT Treatment/Exercise - 02/15/17 0001      Ambulation/Gait   Ambulation/Gait Yes   Ambulation Distance (Feet) 200 Feet   Gait Comments ambulated after manual and stretching; pt noted to be more upright but he still had antalgic gait due to L hip pain     Lumbar Exercises: Stretches   Active Hamstring Stretch Limitations 10 reps x 5 seconds with hands behind knee and flex/ext knee with contralateral limb extended to stretch hip flexors   Pelvic Tilt Limitations supine adductor stretch x 2 mins total (frog stretch)   Quad Stretch 3 reps;30 seconds   Quad Stretch Limitations sidelying with hip ext and knee flexion; also performed thomas test hip  flexor stretch to L hip flexor x 2 mins total     Lumbar Exercises: Standing   Other Standing Lumbar Exercises bil SLS with intermittent UE support, 5x 10 sec each   Other Standing Lumbar Exercises bil hip ext x 10 reps each side; bil standing hip abd with RTB x 10 each     Manual Therapy   Manual Therapy Soft tissue mobilization   Manual therapy comments separate rest of session   Soft tissue mobilization pt in sidelying for IASTM with stick to L hip flexor and L quads and L distal lateral HS; pt in supine in frog stretch position for IASTM to medial - distal L adductors               PT Education - 02/15/17 1130    Education provided Yes   Education Details add in thomas test hip flexor stretch to HEP, exercise technique   Person(s) Educated Patient   Methods Explanation;Demonstration;Handout   Comprehension Verbalized understanding;Returned demonstration          PT Short Term Goals - 02/08/17 1126      PT SHORT TERM GOAL #1   Title Pt will be independent with HEP to decrease pain and improve overall function.   Time 3   Period Weeks   Status Achieved     PT SHORT TERM GOAL #2   Title Pt will have improved 5xSTS to <20 seconds with no UE support from chair to demonstrate decreased pain and improved overall strength and function.   Baseline 5/3: 21 seconds   Time 3   Period Weeks   Status On-going     PT SHORT TERM GOAL #3   Title Pt will have improved 3MWT by 112f or > and requiring 1 rest break or < during to demonstrate improved overall function and improved tolerance to ambulation.   Baseline 5/3: 6479fwith 1 standing and 1 sitting rest break   Time 3   Period Weeks   Status On-going     PT SHORT TERM GOAL #4   Title Pt will report being able to sleep through the night with 1 to no awakenings due to LBP to maximize recovery and improve overall function.   Baseline 5/3: pt able to sleep in his bed now and for the last couple of days he has only been  awakened 1x in the night, but usually it is 3x/night   Time 3   Period Weeks   Status Partially Met           PT Long Term Goals - 02/08/17 1127      PT LONG TERM GOAL #1   Title Pt will have improved 5xSTS to <10 seconds from chair with no UE support to demonstrate improved overall power, strength,  and function.    Baseline 5/3: 21 seconds   Time 6   Period Weeks   Status On-going     PT LONG TERM GOAL #2   Title Pt will have improved overall MMT to at least 4+/5 throughout BLE to demonstrate improved strength in order to maximize gait and function at home and in the community.   Time 6   Period Weeks   Status On-going     PT LONG TERM GOAL #3   Title Pt will have improved lumbar ROM by at least 50% with minimal to no pain in order to demonstrate improved overall function and maximize return to PLOF.   Time 6   Period Weeks   Status On-going     PT LONG TERM GOAL #4   Title Pt will report being able to stand for 1 hour or > to maximize his ability to cook, wash dishes, and feed his animals, requiring 2 rest breaks or < to demonstrate improved toleranace for extension-based activities and maximize function at home.    Baseline 5/3: Pt states that he can cook and wash dishes for longer periods of time with decreased rest breaks but he still has to sit down; he reports he'd have to rest 4 times if he had to stand for an hour   Time 6   Period Weeks   Status On-going     PT LONG TERM GOAL #5   Title Pt will have improved bil SLS to at least 30 seconds or > on firm surface to maximize gait and demonstrate improved overall strength, balance, and function.   Baseline 5/3: R: 12, L 13   Time 6   Period Weeks   Status On-going               Plan - 02/15/17 1139    Clinical Impression Statement Pt presented to therapy with less pain compared to last session. He tolerated therex and manual well and was noted to have improved extension in standing following manual and  stretching. He did not tolerate lying prone well at all so will hold off on this until pt's hip flexors have improved flexibility. Added thomas test hip flexor strech to HEP to promote improved hip flexor length and decrease forward flexed position. Pt tolerated standing hip strengthening well but was educated that he might have increased soreness following session and he verbalized understanding.   PT Frequency 2x / week   PT Duration 6 weeks   PT Treatment/Interventions ADLs/Self Care Home Management;Traction;Gait training;Stair training;Functional mobility training;Therapeutic activities;Therapeutic exercise;Balance training;Neuromuscular re-education;Patient/family education;Manual techniques;Passive range of motion;Dry needling;Taping   PT Next Visit Plan Continue to encourage more extension posturing as extreme forward bent. continue stretching of adductors, continue manual with stick to quads, HS, and adductors; continue strengthening of hip ER, continue standing hip strengthening exercises, trial bil pulldowns with front foot elevated on 2" step, gait training   PT Home Exercise Plan eval: SKTC, DKTC, L piriformis stretch; added in frog adductor stretch and glute max bridge on 4/20. 4/27 added in groin stretch; 5/11: thomas test hip flexor stretch   Consulted and Agree with Plan of Care Patient      Patient will benefit from skilled therapeutic intervention in order to improve the following deficits and impairments:  Abnormal gait, Decreased activity tolerance, Decreased balance, Decreased endurance, Decreased mobility, Decreased range of motion, Decreased strength, Difficulty walking, Hypomobility, Impaired flexibility, Improper body mechanics, Postural dysfunction, Pain  Visit Diagnosis: Chronic bilateral low back  pain with left-sided sciatica  Difficulty in walking, not elsewhere classified  Muscle weakness (generalized)  Other symptoms and signs involving the musculoskeletal  system     Problem List Patient Active Problem List   Diagnosis Date Noted  . Fracture of L2 vertebra ([place]) 01/12/2015  . Acute osteomyelitis of calcaneum ([place]) 10/09/2014  . Dysuria 10/05/2014  . Cellulitis 10/05/2014  . Fall from ladder 09/21/2014  . L2 vertebral fracture ([place]) 09/21/2014  . Bilateral calcaneal fractures 09/21/2014  . Chronic pain 09/21/2014  . Acute blood loss anemia 09/21/2014  . Open right calcaneal fracture 09/15/2014     Geraldine Solar PT, DPT   [place] [address] [place], [place], [address] Phone: [phone]   Fax:  [phone]  Name: ARDON FRANKLIN MRN: [id number] Date of Birth: 1956/10/01

## 2017-02-18 ENCOUNTER — Telehealth (HOSPITAL_COMMUNITY): Payer: Self-pay | Admitting: Internal Medicine

## 2017-02-18 DIAGNOSIS — M5137 Other intervertebral disc degeneration, lumbosacral region: Secondary | ICD-10-CM | POA: Diagnosis not present

## 2017-02-18 DIAGNOSIS — S32001A Stable burst fracture of unspecified lumbar vertebra, initial encounter for closed fracture: Secondary | ICD-10-CM | POA: Diagnosis not present

## 2017-02-18 NOTE — Telephone Encounter (Signed)
02/18/17 pt called to cancel all appts.  He saw his dr and he told him for now to cx until he sees him again

## 2017-02-19 ENCOUNTER — Ambulatory Visit (HOSPITAL_COMMUNITY): Payer: PPO | Admitting: Physical Therapy

## 2017-02-22 ENCOUNTER — Encounter (HOSPITAL_COMMUNITY): Payer: PPO | Admitting: Physical Therapy

## 2017-02-25 ENCOUNTER — Encounter (HOSPITAL_COMMUNITY): Payer: Self-pay

## 2017-02-25 DIAGNOSIS — M5442 Lumbago with sciatica, left side: Secondary | ICD-10-CM | POA: Diagnosis not present

## 2017-02-25 NOTE — Therapy (Signed)
[place], [place], [address] Phone: [phone]   Fax:  [phone]  Patient Details  Name: Bradley Espinoza MRN: [id number] Date of Birth: 09/25/1956 Referring Provider:  No ref. provider found  Encounter Date: 02/25/2017     Pt called on 02/18/17 and stated that his doctor told him to cancel all of his remaining PT appointments until he goes back to see him.   PHYSICAL THERAPY DISCHARGE SUMMARY  Visits from Start of Care: 9  Current functional level related to goals / functional outcomes: See last reassessment note on 02/08/17.   Remaining deficits: See last reassessment note on 02/08/17   Education / Equipment: n/a Plan: Patient agrees to discharge.  Patient goals were partially met. Patient is being discharged due to the patient's request.  ?????     Geraldine Solar PT, [place] [address] [place], [place], [address] Phone: [phone]   Fax:  [phone]

## 2017-02-26 ENCOUNTER — Encounter (HOSPITAL_COMMUNITY): Payer: PPO | Admitting: Physical Therapy

## 2017-02-26 DIAGNOSIS — M5442 Lumbago with sciatica, left side: Secondary | ICD-10-CM | POA: Diagnosis not present

## 2017-03-01 ENCOUNTER — Encounter (HOSPITAL_COMMUNITY): Payer: PPO

## 2017-03-08 ENCOUNTER — Encounter (HOSPITAL_COMMUNITY): Payer: PPO

## 2017-03-11 DIAGNOSIS — M5137 Other intervertebral disc degeneration, lumbosacral region: Secondary | ICD-10-CM | POA: Diagnosis not present

## 2017-03-11 DIAGNOSIS — M5416 Radiculopathy, lumbar region: Secondary | ICD-10-CM | POA: Diagnosis not present

## 2017-03-14 ENCOUNTER — Other Ambulatory Visit: Payer: Self-pay | Admitting: Neurosurgery

## 2017-03-26 NOTE — Pre-Procedure Instructions (Signed)
Bradley Espinoza  03/26/2017      [place], [place] [address] [address] Phone: [phone] Fax: [place] [place] [address] [place] [address] Phone: [phone] Fax: [phone]    Your procedure is scheduled on  Friday  04/05/17  Report to [facility] at 830 A.M.  Call this number if you have problems the morning of surgery:  [phone]   Remember:  Do not eat food or drink liquids after midnight.  Take these medicines the morning of surgery with A SIP OF WATER   GABAPENTIN (NEURONTIN ), OXYCODONE IF NEEDED, RANITIDINE (ZANTAC)   7 days prior to surgery STOP taking any Aspirin, Aleve, Naproxen, Ibuprofen, Motrin, Advil, Goody's, BC's, all herbal medications, fish oil, and all vitamins ,MELOXICAM/ MOBIC   Do not wear jewelry, make-up or nail polish.  Do not wear lotions, powders, or perfumes, or deoderant.  Do not shave 48 hours prior to surgery.  Men may shave face and neck.  Do not bring valuables to the hospital.  [facility] is not responsible for any belongings or valuables.  Contacts, dentures or bridgework may not be worn into surgery.  Leave your suitcase in the car.  After surgery it may be brought to your room.  For patients admitted to the hospital, discharge time will be determined by your treatment team.  Patients discharged the day of surgery will not be allowed to drive home.   Name and phone number of your driver:    Special instructions:  [facility] - Preparing for Surgery  Before surgery, you can play an important role.  Because skin is not sterile, your skin needs to be as free of germs as possible.  You can reduce the number of germs on you skin by washing with CHG (chlorahexidine gluconate) soap before surgery.  CHG is an antiseptic cleaner which kills germs and bonds with the  skin to continue killing germs even after washing.  Please DO NOT use if you have an allergy to CHG or antibacterial soaps.  If your skin becomes reddened/irritated stop using the CHG and inform your nurse when you arrive at Short Stay.  Do not shave (including legs and underarms) for at least 48 hours prior to the first CHG shower.  You may shave your face.  Please follow these instructions carefully:   1.  Shower with CHG Soap the night before surgery and the                                morning of Surgery.  2.  If you choose to wash your hair, wash your hair first as usual with your       normal shampoo.  3.  After you shampoo, rinse your hair and body thoroughly to remove the                      Shampoo.  4.  Use CHG as you would any other liquid soap.  You can apply chg directly       to the skin and wash gently with scrungie or a clean washcloth.  5.  Apply the CHG Soap to your body ONLY FROM THE NECK DOWN.  Do not use on open wounds or open sores.  Avoid contact with your eyes,       ears, mouth and genitals (private parts).  Wash genitals (private parts)       with your normal soap.  6.  Wash thoroughly, paying special attention to the area where your surgery        will be performed.  7.  Thoroughly rinse your body with warm water from the neck down.  8.  DO NOT shower/wash with your normal soap after using and rinsing off       the CHG Soap.  9.  Pat yourself dry with a clean towel.            10.  Wear clean pajamas.            11.  Place clean sheets on your bed the night of your first shower and do not        sleep with pets.  Day of Surgery  Do not apply any lotions/deoderants the morning of surgery.  Please wear clean clothes to the hospital/surgery center.    Please read over the following fact sheets that you were given. MRSA Information and Surgical Site Infection Prevention

## 2017-03-27 ENCOUNTER — Encounter (HOSPITAL_COMMUNITY): Payer: Self-pay

## 2017-03-27 ENCOUNTER — Encounter (HOSPITAL_COMMUNITY)
Admission: RE | Admit: 2017-03-27 | Discharge: 2017-03-27 | Disposition: A | Discharge status: Home / Self Care | Payer: PPO | Source: Ambulatory Visit | Attending: Neurosurgery | Admitting: Neurosurgery

## 2017-03-27 DIAGNOSIS — Z01812 Encounter for preprocedural laboratory examination: Secondary | ICD-10-CM | POA: Insufficient documentation

## 2017-03-27 LAB — TYPE AND SCREEN
ABO/RH(D): A POS
ANTIBODY SCREEN: NEGATIVE

## 2017-03-27 LAB — CBC
HEMATOCRIT: 44.5 % (ref 39.0–52.0)
Hemoglobin: 14.6 g/dL (ref 13.0–17.0)
MCH: 31.6 pg (ref 26.0–34.0)
MCHC: 32.8 g/dL (ref 30.0–36.0)
MCV: 96.3 fL (ref 78.0–100.0)
PLATELETS: 187 10*3/uL (ref 150–400)
RBC: 4.62 MIL/uL (ref 4.22–5.81)
RDW: 13.6 % (ref 11.5–15.5)
WBC: 8.4 10*3/uL (ref 4.0–10.5)

## 2017-03-27 LAB — BASIC METABOLIC PANEL
ANION GAP: 5 (ref 5–15)
BUN: 13 mg/dL (ref 6–20)
CO2: 29 mmol/L (ref 22–32)
Calcium: 8.9 mg/dL (ref 8.9–10.3)
Chloride: 106 mmol/L (ref 101–111)
Creatinine, Ser: 0.84 mg/dL (ref 0.61–1.24)
GLUCOSE: 91 mg/dL (ref 65–99)
POTASSIUM: 4.2 mmol/L (ref 3.5–5.1)
Sodium: 140 mmol/L (ref 135–145)

## 2017-03-27 LAB — SURGICAL PCR SCREEN
MRSA, PCR: POSITIVE — AB
STAPHYLOCOCCUS AUREUS: POSITIVE — AB

## 2017-04-01 NOTE — Progress Notes (Signed)
Patient called regarding his Meloxicam. States his pain is worse since he has stopped this prior to surgery. Pt states he will call Dr. Windy Carina office to see if he can take this medicine prior to surgery.

## 2017-04-05 ENCOUNTER — Inpatient Hospital Stay (HOSPITAL_COMMUNITY): Payer: PPO | Admitting: Certified Registered Nurse Anesthetist

## 2017-04-05 ENCOUNTER — Inpatient Hospital Stay (HOSPITAL_COMMUNITY): Payer: PPO

## 2017-04-05 ENCOUNTER — Encounter (HOSPITAL_COMMUNITY): Payer: Self-pay | Admitting: Certified Registered Nurse Anesthetist

## 2017-04-05 ENCOUNTER — Inpatient Hospital Stay (HOSPITAL_COMMUNITY)
Admission: RE | Disposition: A | Discharge status: Home / Self Care | Payer: Self-pay | Source: Ambulatory Visit | Attending: Neurosurgery

## 2017-04-05 ENCOUNTER — Inpatient Hospital Stay (HOSPITAL_COMMUNITY)
Admission: RE | Admit: 2017-04-05 | Discharge: 2017-04-07 | DRG: 455 | Disposition: A | Discharge status: Home / Self Care | Payer: PPO | Source: Ambulatory Visit | Attending: Neurosurgery | Admitting: Neurosurgery

## 2017-04-05 DIAGNOSIS — K219 Gastro-esophageal reflux disease without esophagitis: Secondary | ICD-10-CM | POA: Diagnosis present

## 2017-04-05 DIAGNOSIS — Z981 Arthrodesis status: Secondary | ICD-10-CM

## 2017-04-05 DIAGNOSIS — Z8711 Personal history of peptic ulcer disease: Secondary | ICD-10-CM

## 2017-04-05 DIAGNOSIS — S32029A Unspecified fracture of second lumbar vertebra, initial encounter for closed fracture: Secondary | ICD-10-CM | POA: Diagnosis not present

## 2017-04-05 DIAGNOSIS — Z419 Encounter for procedure for purposes other than remedying health state, unspecified: Secondary | ICD-10-CM

## 2017-04-05 DIAGNOSIS — F418 Other specified anxiety disorders: Secondary | ICD-10-CM | POA: Diagnosis not present

## 2017-04-05 DIAGNOSIS — M5136 Other intervertebral disc degeneration, lumbar region: Secondary | ICD-10-CM | POA: Diagnosis not present

## 2017-04-05 DIAGNOSIS — M545 Low back pain: Secondary | ICD-10-CM | POA: Diagnosis present

## 2017-04-05 DIAGNOSIS — Z791 Long term (current) use of non-steroidal anti-inflammatories (NSAID): Secondary | ICD-10-CM | POA: Diagnosis not present

## 2017-04-05 DIAGNOSIS — Z885 Allergy status to narcotic agent status: Secondary | ICD-10-CM | POA: Diagnosis not present

## 2017-04-05 DIAGNOSIS — M47816 Spondylosis without myelopathy or radiculopathy, lumbar region: Secondary | ICD-10-CM | POA: Diagnosis present

## 2017-04-05 DIAGNOSIS — M5137 Other intervertebral disc degeneration, lumbosacral region: Secondary | ICD-10-CM | POA: Diagnosis not present

## 2017-04-05 DIAGNOSIS — M532X6 Spinal instabilities, lumbar region: Secondary | ICD-10-CM | POA: Diagnosis not present

## 2017-04-05 DIAGNOSIS — M4326 Fusion of spine, lumbar region: Secondary | ICD-10-CM | POA: Diagnosis not present

## 2017-04-05 DIAGNOSIS — Z87891 Personal history of nicotine dependence: Secondary | ICD-10-CM

## 2017-04-05 DIAGNOSIS — Z472 Encounter for removal of internal fixation device: Secondary | ICD-10-CM | POA: Diagnosis not present

## 2017-04-05 DIAGNOSIS — Z9889 Other specified postprocedural states: Secondary | ICD-10-CM | POA: Diagnosis not present

## 2017-04-05 HISTORY — PX: LUMBAR PERCUTANEOUS PEDICLE SCREW 1 LEVEL: SHX5560

## 2017-04-05 HISTORY — PX: ANTERIOR LAT LUMBAR FUSION: SHX1168

## 2017-04-05 LAB — GLUCOSE, CAPILLARY: GLUCOSE-CAPILLARY: 228 mg/dL — AB (ref 65–99)

## 2017-04-05 SURGERY — ANTERIOR LATERAL LUMBAR FUSION 1 LEVEL
Anesthesia: General

## 2017-04-05 MED ORDER — CEFAZOLIN SODIUM-DEXTROSE 2-4 GM/100ML-% IV SOLN
2.0000 g | Freq: Three times a day (TID) | INTRAVENOUS | Status: DC
  Administered 2017-04-05 – 2017-04-07 (×5): 2 g via INTRAVENOUS
  Filled 2017-04-05 (×6): qty 100

## 2017-04-05 MED ORDER — ALUM & MAG HYDROXIDE-SIMETH 200-200-20 MG/5ML PO SUSP: 30.0000 mL | Freq: Four times a day (QID) | ORAL | Status: DC | PRN

## 2017-04-05 MED ORDER — SUCCINYLCHOLINE CHLORIDE 20 MG/ML IJ SOLN
INTRAMUSCULAR | Status: DC | PRN
  Administered 2017-04-05: 70 mg via INTRAVENOUS

## 2017-04-05 MED ORDER — ACETAMINOPHEN 325 MG PO TABS: 650.0000 mg | ORAL_TABLET | ORAL | Status: DC | PRN

## 2017-04-05 MED ORDER — LIDOCAINE-EPINEPHRINE 1 %-1:100000 IJ SOLN
INTRAMUSCULAR | Status: DC | PRN
  Administered 2017-04-05: 10 mL

## 2017-04-05 MED ORDER — CHLORHEXIDINE GLUCONATE CLOTH 2 % EX PADS: 6.0000 | MEDICATED_PAD | Freq: Once | CUTANEOUS | Status: DC

## 2017-04-05 MED ORDER — FENTANYL CITRATE (PF) 100 MCG/2ML IJ SOLN
25.0000 ug | INTRAMUSCULAR | Status: DC | PRN
  Administered 2017-04-05 (×3): 50 ug via INTRAVENOUS

## 2017-04-05 MED ORDER — LIDOCAINE-EPINEPHRINE (PF) 1 %-1:200000 IJ SOLN
INTRAMUSCULAR | Status: DC | PRN
  Administered 2017-04-05: 10 mL

## 2017-04-05 MED ORDER — OXYCODONE-ACETAMINOPHEN 5-325 MG PO TABS
1.0000 | ORAL_TABLET | ORAL | Status: DC
  Administered 2017-04-05 – 2017-04-07 (×11): 1 via ORAL
  Filled 2017-04-05 (×11): qty 1

## 2017-04-05 MED ORDER — SODIUM CHLORIDE 0.9% FLUSH
3.0000 mL | Freq: Two times a day (BID) | INTRAVENOUS | Status: DC
  Administered 2017-04-05: 3 mL via INTRAVENOUS

## 2017-04-05 MED ORDER — ROCURONIUM BROMIDE 100 MG/10ML IV SOLN
INTRAVENOUS | Status: DC | PRN
  Administered 2017-04-05: 50 mg via INTRAVENOUS
  Administered 2017-04-05: 20 mg via INTRAVENOUS

## 2017-04-05 MED ORDER — FENTANYL CITRATE (PF) 250 MCG/5ML IJ SOLN
INTRAMUSCULAR | Status: AC
  Filled 2017-04-05: qty 5

## 2017-04-05 MED ORDER — PANTOPRAZOLE SODIUM 40 MG IV SOLR
40.0000 mg | Freq: Every day | INTRAVENOUS | Status: DC
  Administered 2017-04-05: 40 mg via INTRAVENOUS
  Filled 2017-04-05: qty 40

## 2017-04-05 MED ORDER — OXYCODONE HCL 5 MG PO TABS
ORAL_TABLET | ORAL | Status: AC
  Administered 2017-04-05: 5 mg
  Filled 2017-04-05: qty 1

## 2017-04-05 MED ORDER — VANCOMYCIN HCL 1000 MG IV SOLR
INTRAVENOUS | Status: DC | PRN
  Administered 2017-04-05: 1000 mg

## 2017-04-05 MED ORDER — THROMBIN 5000 UNITS EX SOLR
CUTANEOUS | Status: AC
  Filled 2017-04-05: qty 15000

## 2017-04-05 MED ORDER — HYDROMORPHONE HCL 1 MG/ML IJ SOLN
1.0000 mg | INTRAMUSCULAR | Status: DC | PRN
  Administered 2017-04-05 – 2017-04-07 (×5): 1 mg via INTRAVENOUS
  Filled 2017-04-05 (×5): qty 1

## 2017-04-05 MED ORDER — BUPIVACAINE-EPINEPHRINE (PF) 0.5% -1:200000 IJ SOLN
INTRAMUSCULAR | Status: AC
  Filled 2017-04-05: qty 30

## 2017-04-05 MED ORDER — EPHEDRINE SULFATE-NACL 50-0.9 MG/10ML-% IV SOSY
PREFILLED_SYRINGE | INTRAVENOUS | Status: DC | PRN
  Administered 2017-04-05: 10 mg via INTRAVENOUS

## 2017-04-05 MED ORDER — PROMETHAZINE HCL 25 MG/ML IJ SOLN: 6.2500 mg | INTRAMUSCULAR | Status: DC | PRN

## 2017-04-05 MED ORDER — OXYCODONE HCL 5 MG PO TABS
ORAL_TABLET | ORAL | Status: AC
  Filled 2017-04-05: qty 2

## 2017-04-05 MED ORDER — FENTANYL CITRATE (PF) 100 MCG/2ML IJ SOLN
INTRAMUSCULAR | Status: AC
  Filled 2017-04-05: qty 2

## 2017-04-05 MED ORDER — ONDANSETRON HCL 4 MG/2ML IJ SOLN
INTRAMUSCULAR | Status: AC
  Filled 2017-04-05: qty 2

## 2017-04-05 MED ORDER — SUGAMMADEX SODIUM 200 MG/2ML IV SOLN
INTRAVENOUS | Status: DC | PRN
  Administered 2017-04-05: 160 mg via INTRAVENOUS

## 2017-04-05 MED ORDER — PROPOFOL 10 MG/ML IV BOLUS
INTRAVENOUS | Status: DC | PRN
  Administered 2017-04-05: 120 mg via INTRAVENOUS
  Administered 2017-04-05: 40 mg via INTRAVENOUS

## 2017-04-05 MED ORDER — MENTHOL 3 MG MT LOZG: 1.0000 | LOZENGE | OROMUCOSAL | Status: DC | PRN

## 2017-04-05 MED ORDER — DEXAMETHASONE SODIUM PHOSPHATE 10 MG/ML IJ SOLN
10.0000 mg | INTRAMUSCULAR | Status: AC
  Administered 2017-04-05: 10 mg via INTRAVENOUS
  Filled 2017-04-05: qty 1

## 2017-04-05 MED ORDER — FENTANYL CITRATE (PF) 100 MCG/2ML IJ SOLN
INTRAMUSCULAR | Status: DC | PRN
  Administered 2017-04-05 (×3): 50 ug via INTRAVENOUS
  Administered 2017-04-05: 150 ug via INTRAVENOUS

## 2017-04-05 MED ORDER — LIDOCAINE-EPINEPHRINE 1 %-1:100000 IJ SOLN
INTRAMUSCULAR | Status: AC
  Filled 2017-04-05: qty 1

## 2017-04-05 MED ORDER — MIDAZOLAM HCL 5 MG/5ML IJ SOLN
INTRAMUSCULAR | Status: DC | PRN
  Administered 2017-04-05: 2 mg via INTRAVENOUS

## 2017-04-05 MED ORDER — 0.9 % SODIUM CHLORIDE (POUR BTL) OPTIME
TOPICAL | Status: DC | PRN
  Administered 2017-04-05 (×2): 1000 mL

## 2017-04-05 MED ORDER — THROMBIN 5000 UNITS EX SOLR
CUTANEOUS | Status: DC | PRN
  Administered 2017-04-05 (×2): 5000 [IU] via TOPICAL

## 2017-04-05 MED ORDER — SODIUM CHLORIDE 0.9% FLUSH: 3.0000 mL | INTRAVENOUS | Status: DC | PRN

## 2017-04-05 MED ORDER — ACETAMINOPHEN 500 MG PO TABS: 1000.0000 mg | ORAL_TABLET | Freq: Every day | ORAL | Status: DC | PRN

## 2017-04-05 MED ORDER — HEMOSTATIC AGENTS (NO CHARGE) OPTIME
TOPICAL | Status: DC | PRN
  Administered 2017-04-05: 1 via TOPICAL

## 2017-04-05 MED ORDER — PHENYLEPHRINE HCL 10 MG/ML IJ SOLN
INTRAVENOUS | Status: DC | PRN
  Administered 2017-04-05: 30 ug/min via INTRAVENOUS

## 2017-04-05 MED ORDER — ACETAMINOPHEN 650 MG RE SUPP: 650.0000 mg | RECTAL | Status: DC | PRN

## 2017-04-05 MED ORDER — CEFAZOLIN SODIUM 1 G IJ SOLR
INTRAMUSCULAR | Status: AC
  Filled 2017-04-05: qty 20

## 2017-04-05 MED ORDER — GABAPENTIN 300 MG PO CAPS
300.0000 mg | ORAL_CAPSULE | Freq: Two times a day (BID) | ORAL | Status: DC
  Administered 2017-04-05 – 2017-04-07 (×4): 300 mg via ORAL
  Filled 2017-04-05 (×4): qty 1

## 2017-04-05 MED ORDER — FAMOTIDINE 20 MG PO TABS
20.0000 mg | ORAL_TABLET | Freq: Two times a day (BID) | ORAL | Status: DC
  Administered 2017-04-05 – 2017-04-07 (×4): 20 mg via ORAL
  Filled 2017-04-05 (×4): qty 1

## 2017-04-05 MED ORDER — MELOXICAM 7.5 MG PO TABS
15.0000 mg | ORAL_TABLET | Freq: Every day | ORAL | Status: DC
  Administered 2017-04-06 – 2017-04-07 (×2): 15 mg via ORAL
  Filled 2017-04-05 (×2): qty 2

## 2017-04-05 MED ORDER — NAPROXEN SODIUM 220 MG PO TABS: 220.0000 mg | ORAL_TABLET | Freq: Two times a day (BID) | ORAL | Status: DC | PRN

## 2017-04-05 MED ORDER — ONDANSETRON HCL 4 MG PO TABS: 4.0000 mg | ORAL_TABLET | Freq: Four times a day (QID) | ORAL | Status: DC | PRN

## 2017-04-05 MED ORDER — ONDANSETRON HCL 4 MG/2ML IJ SOLN
INTRAMUSCULAR | Status: DC | PRN
  Administered 2017-04-05: 4 mg via INTRAVENOUS

## 2017-04-05 MED ORDER — LACTATED RINGERS IV SOLN
INTRAVENOUS | Status: DC
  Administered 2017-04-05 – 2017-04-06 (×4): via INTRAVENOUS

## 2017-04-05 MED ORDER — PHENOL 1.4 % MT LIQD: 1.0000 | OROMUCOSAL | Status: DC | PRN

## 2017-04-05 MED ORDER — CYCLOBENZAPRINE HCL 10 MG PO TABS
10.0000 mg | ORAL_TABLET | Freq: Three times a day (TID) | ORAL | Status: DC | PRN
  Administered 2017-04-05 – 2017-04-07 (×3): 10 mg via ORAL
  Filled 2017-04-05 (×4): qty 1

## 2017-04-05 MED ORDER — OXYCODONE HCL 5 MG PO TABS
15.0000 mg | ORAL_TABLET | ORAL | Status: DC | PRN
  Administered 2017-04-05: 10 mg via ORAL
  Administered 2017-04-06 – 2017-04-07 (×6): 15 mg via ORAL
  Filled 2017-04-05 (×7): qty 3

## 2017-04-05 MED ORDER — IBUPROFEN 200 MG PO TABS
800.0000 mg | ORAL_TABLET | Freq: Two times a day (BID) | ORAL | Status: DC | PRN
  Administered 2017-04-06: 800 mg via ORAL
  Filled 2017-04-05: qty 4

## 2017-04-05 MED ORDER — SODIUM CHLORIDE 0.9 % IV SOLN: 250.0000 mL | INTRAVENOUS | Status: DC

## 2017-04-05 MED ORDER — CEFAZOLIN SODIUM-DEXTROSE 2-4 GM/100ML-% IV SOLN
2.0000 g | INTRAVENOUS | Status: AC
  Administered 2017-04-05 (×2): 2 g via INTRAVENOUS
  Filled 2017-04-05: qty 100

## 2017-04-05 MED ORDER — SODIUM CHLORIDE 0.9 % IR SOLN
Status: DC | PRN
  Administered 2017-04-05: 500 mL

## 2017-04-05 MED ORDER — ONDANSETRON HCL 4 MG/2ML IJ SOLN: 4.0000 mg | Freq: Four times a day (QID) | INTRAMUSCULAR | Status: DC | PRN

## 2017-04-05 MED ORDER — SUGAMMADEX SODIUM 200 MG/2ML IV SOLN
INTRAVENOUS | Status: AC
  Filled 2017-04-05: qty 2

## 2017-04-05 MED ORDER — VANCOMYCIN HCL 1000 MG IV SOLR
INTRAVENOUS | Status: AC
  Filled 2017-04-05: qty 1000

## 2017-04-05 MED ORDER — PHENYLEPHRINE 40 MCG/ML (10ML) SYRINGE FOR IV PUSH (FOR BLOOD PRESSURE SUPPORT)
PREFILLED_SYRINGE | INTRAVENOUS | Status: DC | PRN
  Administered 2017-04-05 (×4): 80 ug via INTRAVENOUS

## 2017-04-05 MED ORDER — MIDAZOLAM HCL 2 MG/2ML IJ SOLN
INTRAMUSCULAR | Status: AC
  Filled 2017-04-05: qty 2

## 2017-04-05 SURGICAL SUPPLY — 75 items
BAG DECANTER FOR FLEXI CONT (MISCELLANEOUS) ×3 IMPLANT
BENZOIN TINCTURE PRP APPL 2/3 (GAUZE/BANDAGES/DRESSINGS) ×3 IMPLANT
BLADE CLIPPER SURG (BLADE) ×3 IMPLANT
BONE MATRIX OSTEOCEL PRO MED (Bone Implant) ×3 IMPLANT
BONE VIVIGEN FORMABLE 5.4CC (Bone Implant) ×3 IMPLANT
CAP LOCKING REVERE (Cap) ×6 IMPLANT
CAP LOCKING THREADED (Cap) ×6 IMPLANT
CARTRIDGE OIL MAESTRO DRILL (MISCELLANEOUS) ×1 IMPLANT
CLOSURE WOUND 1/2 X4 (GAUZE/BANDAGES/DRESSINGS) ×1
CONT SPEC 4OZ CLIKSEAL STRL BL (MISCELLANEOUS) ×3 IMPLANT
COVER BACK TABLE 24X17X13 BIG (DRAPES) IMPLANT
COVER BACK TABLE 60X90IN (DRAPES) ×3 IMPLANT
DERMABOND ADVANCED (GAUZE/BANDAGES/DRESSINGS) ×4
DERMABOND ADVANCED .7 DNX12 (GAUZE/BANDAGES/DRESSINGS) ×2 IMPLANT
DIFFUSER DRILL AIR PNEUMATIC (MISCELLANEOUS) ×3 IMPLANT
DRAPE C-ARM 42X72 X-RAY (DRAPES) ×6 IMPLANT
DRAPE C-ARMOR (DRAPES) ×3 IMPLANT
DRAPE LAPAROTOMY 100X72X124 (DRAPES) ×6 IMPLANT
DRAPE POUCH INSTRU U-SHP 10X18 (DRAPES) ×6 IMPLANT
DRAPE SURG 17X23 STRL (DRAPES) ×6 IMPLANT
DRSG OPSITE 4X5.5 SM (GAUZE/BANDAGES/DRESSINGS) ×3 IMPLANT
DRSG OPSITE POSTOP 4X10 (GAUZE/BANDAGES/DRESSINGS) ×3 IMPLANT
DRSG OPSITE POSTOP 4X6 (GAUZE/BANDAGES/DRESSINGS) ×6 IMPLANT
ELECT BLADE 4.0 EZ CLEAN MEGAD (MISCELLANEOUS) ×3
ELECT REM PT RETURN 9FT ADLT (ELECTROSURGICAL) ×6
ELECTRODE BLDE 4.0 EZ CLN MEGD (MISCELLANEOUS) ×1 IMPLANT
ELECTRODE REM PT RTRN 9FT ADLT (ELECTROSURGICAL) ×2 IMPLANT
EVACUATOR 1/8 PVC DRAIN (DRAIN) ×3 IMPLANT
GAUZE SPONGE 4X4 12PLY STRL (GAUZE/BANDAGES/DRESSINGS) ×3 IMPLANT
GAUZE SPONGE 4X4 16PLY XRAY LF (GAUZE/BANDAGES/DRESSINGS) IMPLANT
GLOVE BIO SURGEON STRL SZ7 (GLOVE) ×3 IMPLANT
GLOVE BIO SURGEON STRL SZ8 (GLOVE) ×6 IMPLANT
GLOVE BIOGEL PI IND STRL 7.0 (GLOVE) ×1 IMPLANT
GLOVE BIOGEL PI INDICATOR 7.0 (GLOVE) ×2
GLOVE EXAM NITRILE LRG STRL (GLOVE) IMPLANT
GLOVE EXAM NITRILE XL STR (GLOVE) IMPLANT
GLOVE EXAM NITRILE XS STR PU (GLOVE) IMPLANT
GLOVE INDICATOR 7.5 STRL GRN (GLOVE) ×9 IMPLANT
GLOVE INDICATOR 8.5 STRL (GLOVE) ×6 IMPLANT
GOWN STRL REUS W/ TWL LRG LVL3 (GOWN DISPOSABLE) ×2 IMPLANT
GOWN STRL REUS W/ TWL XL LVL3 (GOWN DISPOSABLE) ×2 IMPLANT
GOWN STRL REUS W/TWL 2XL LVL3 (GOWN DISPOSABLE) IMPLANT
GOWN STRL REUS W/TWL LRG LVL3 (GOWN DISPOSABLE) ×4
GOWN STRL REUS W/TWL XL LVL3 (GOWN DISPOSABLE) ×4
KIT BASIN OR (CUSTOM PROCEDURE TRAY) ×3 IMPLANT
KIT DILATOR XLIF 5 (KITS) ×1 IMPLANT
KIT ROOM TURNOVER OR (KITS) ×6 IMPLANT
KIT SURGICAL ACCESS MAXCESS 4 (KITS) ×3 IMPLANT
KIT XLIF (KITS) ×2
MODULE NVM5 NEXT GEN EMG (NEEDLE) ×3 IMPLANT
MODULUS XLW 10X22X50MM 15DEG (Spine Construct) ×3 IMPLANT
NEEDLE HYPO 22GX1.5 SAFETY (NEEDLE) ×3 IMPLANT
NEEDLE HYPO 25X1 1.5 SAFETY (NEEDLE) ×3 IMPLANT
NS IRRIG 1000ML POUR BTL (IV SOLUTION) ×6 IMPLANT
OIL CARTRIDGE MAESTRO DRILL (MISCELLANEOUS) ×3
PACK LAMINECTOMY NEURO (CUSTOM PROCEDURE TRAY) ×6 IMPLANT
ROD CREO 45MM SPINAL (Rod) ×6 IMPLANT
SPONGE LAP 4X18 X RAY DECT (DISPOSABLE) IMPLANT
SPONGE SURGIFOAM ABS GEL SZ50 (HEMOSTASIS) ×3 IMPLANT
STAPLER SKIN PROX WIDE 3.9 (STAPLE) ×3 IMPLANT
STRIP BIOACTIVE 20CC 25X100X8 (Miscellaneous) ×3 IMPLANT
STRIP CLOSURE SKIN 1/2X4 (GAUZE/BANDAGES/DRESSINGS) ×2 IMPLANT
SUT VIC AB 0 CT1 18XCR BRD8 (SUTURE) ×2 IMPLANT
SUT VIC AB 0 CT1 8-18 (SUTURE) ×4
SUT VIC AB 2-0 CT1 18 (SUTURE) ×12 IMPLANT
SUT VIC AB 4-0 PS2 27 (SUTURE) ×6 IMPLANT
SUT VICRYL 4-0 PS2 18IN ABS (SUTURE) ×6 IMPLANT
SWAB CULTURE LIQ STUART DBL (MISCELLANEOUS) ×3 IMPLANT
SWAB CULTURE LIQUID MINI MALE (MISCELLANEOUS) ×3 IMPLANT
SWABSTICK BENZOIN STERILE (MISCELLANEOUS) ×6 IMPLANT
SYR CONTROL 10ML LL (SYRINGE) ×3 IMPLANT
TOWEL GREEN STERILE (TOWEL DISPOSABLE) ×3 IMPLANT
TOWEL GREEN STERILE FF (TOWEL DISPOSABLE) ×3 IMPLANT
TRAY FOLEY W/METER SILVER 16FR (SET/KITS/TRAYS/PACK) ×3 IMPLANT
WATER STERILE IRR 1000ML POUR (IV SOLUTION) ×3 IMPLANT

## 2017-04-05 NOTE — OR Nursing (Signed)
Nuvasive nerve monitoring electrodes applied prior to positioning to upper trunk and lower extremity by Bryna Colander Rn

## 2017-04-05 NOTE — H&P (Signed)
Bradley Espinoza is an 61 y.o. male.   Chief Complaint: Back and leg pain HPI: 61 year old woman previously undergone L4-S1 fusion subsequent L1 L3 fusion for L2 burst fracture is developed progress worsening back and bilateral leg pain in an L3 and L4 nerve root pattern. Workup has revealed progressive progressive spondylosis breakdown at L3 for the [place] between 2 fused masses. Due to patient's progression of clinical syndrome imaging findings and failure conservative treatment I recommended a anterior lateral interbody fusion at L3-4 with reexploration of his posterior fusion removal of bone stimulator removal of hardware and connecting out the screws at L3-4. I've extensively gone over the risks and benefits of this operation with him as well as perioperative course expectations of outcome and alternatives surgery and he understands and agrees to proceed forward.  Past Medical History:  Diagnosis Date  . Anxiety   . Arthritis    low back pain, lumbar radiculopathy  . Depression   . Fracture    B/L ankles  . GERD (gastroesophageal reflux disease)   . Headache(784.0)    allergy related   . History of kidney stones   . History of stomach ulcers   . Retained orthopedic hardware    failed retained hardware right foot  . Wears glasses     Past Surgical History:  Procedure Laterality Date  . ANKLE FUSION Right 05/11/2015   Procedure: Right Posterior Arthroscopic Subtalar Arthrodesis;  Surgeon: Newt Minion, MD;  Location: [facility];  Service: Orthopedics;  Laterality: Right;  . BACK SURGERY  2004   x 2  . CERVICAL SPINE SURGERY  2008  . ESOPHAGOGASTRODUODENOSCOPY    . HARDWARE REMOVAL Right 10/09/2014   Procedure: Removal Deep Hardware, Irrigation and Debridement Calcaneus, Place Antibiotic Beads and Wound VAC ;  Surgeon: Newt Minion, MD;  Location: [place];  Service: Orthopedics;  Laterality: Right;  . HARDWARE REMOVAL Right 08/12/2015   Procedure: Removal Deep Hardware Right Foot;   Surgeon: Newt Minion, MD;  Location: [place];  Service: Orthopedics;  Laterality: Right;  . I&D EXTREMITY Right 09/15/2014   Procedure: IRRIGATION AND DEBRIDEMENT Ankle;  Surgeon: Renette Butters, MD;  Location: [facility];  Service: Orthopedics;  Laterality: Right;  . INGUINAL HERNIA REPAIR Bilateral   . ORIF CALCANEOUS FRACTURE Right 09/19/2014   Procedure: OPEN REDUCTION INTERNAL FIXATION (ORIF) CALCANEOUS FRACTURE;  Surgeon: Newt Minion, MD;  Location: [facility];  Service: Orthopedics;  Laterality: Right;  . ORIF CALCANEOUS FRACTURE Left 09/19/2014   Procedure: OPEN REDUCTION INTERNAL FIXATION (ORIF) CALCANEOUS FRACTURE;  Surgeon: Newt Minion, MD;  Location: [facility];  Service: Orthopedics;  Laterality: Left;    Family History  Problem Relation Age of Onset  . Diabetes Father   . Cancer Other    Social History:  reports that he quit smoking about 2 years ago. His smoking use included Cigarettes and Cigars. He started smoking about 40 years ago. He has a 10.00 pack-year smoking history. He has never used smokeless tobacco. He reports that he does not drink alcohol or use drugs.  Allergies:  Allergies  Allergen Reactions  . Codeine Nausea And Vomiting    Medications Prior to Admission  Medication Sig Dispense Refill  . acetaminophen (TYLENOL) 500 MG tablet Take 1,000 mg by mouth daily as needed for headache.    . gabapentin (NEURONTIN) 300 MG capsule Take 300 mg by mouth 2 (two) times daily.    Marland Kitchen ibuprofen (ADVIL,MOTRIN) 200 MG tablet Take 800 mg by  mouth 2 (two) times daily as needed for headache.     . meloxicam (MOBIC) 15 MG tablet Take 15 mg by mouth daily.    . naproxen sodium (ANAPROX) 220 MG tablet Take 220 mg by mouth 2 (two) times daily as needed (pain).    Marland Kitchen oxyCODONE-acetaminophen (PERCOCET/ROXICET) 5-325 MG tablet Take 1 tablet by mouth every 4 (four) hours.     . ranitidine (ZANTAC) 75 MG tablet Take 75 mg by mouth daily as needed for heartburn.      No results found for this  or any previous visit (from the past 48 hour(s)). No results found.  Review of Systems  Musculoskeletal: Positive for back pain and joint pain.  Neurological: Positive for tingling.    Blood pressure (!) 153/83, pulse (!) 58, temperature 97.8 F (36.6 C), temperature source Oral, resp. rate 20, weight 77.6 kg (171 lb), SpO2 99 %. Physical Exam  Constitutional: He is oriented to person, place, and time. He appears well-developed and well-nourished.  HENT:  Head: Normocephalic.  Eyes: Pupils are equal, round, and reactive to light.  Neck: Normal range of motion.  Cardiovascular: Normal rate.   Respiratory: Effort normal.  GI: Soft. Bowel sounds are normal.  Musculoskeletal: Normal range of motion.  Neurological: He is alert and oriented to person, place, and time. He has normal strength. GCS eye subscore is 4. GCS verbal subscore is 5. GCS motor subscore is 6.  Strength is 5 out of 5 iliopsoas, quads, hamstrings, gastric, into tibialis, EHL.     Assessment/Plan 61 year old presents for interbody fusion L3-4. As well as posterior augmentation.  Beautifull Cisar P, MD 04/05/2017, 10:37 AM

## 2017-04-05 NOTE — Progress Notes (Signed)
Orthopedic Tech Progress Note Patient Details:  Bradley Espinoza 1956-09-21 [id number] Patient has brace. Patient ID: Bradley Espinoza, male   DOB: [birth year], 61 y.o.   MRN: [id number]   Bradley Espinoza 04/05/2017, 5:41 PM

## 2017-04-05 NOTE — Transfer of Care (Signed)
Immediate Anesthesia Transfer of Care Note  Patient: Bradley Espinoza  Procedure(s) Performed: Procedure(s): Extreme Lateral Interbody Fusion - Lumbar three-lumbar four ,exploration of fusion Posterior augmentation with globus addition Removal hardware Lumbar one-three. Lumbar four-sacral one,  Removal internal bone growth stimulator (N/A) LUMBAR PERCUTANEOUS PEDICLE SCREW LUMBAR THREE-FOUR (N/A)  Patient Location: PACU  Anesthesia Type:General  Level of Consciousness: lethargic and responds to stimulation  Airway & Oxygen Therapy: Patient Spontanous Breathing and Patient connected to nasal cannula oxygen  Post-op Assessment: Report given to RN  Post vital signs: Reviewed and stable  Last Vitals:  Vitals:   04/05/17 0835  BP: (!) 153/83  Pulse: (!) 58  Resp: 20  Temp: 36.6 C    Last Pain:  Vitals:   04/05/17 0909  TempSrc:   PainSc: 8       Patients Stated Pain Goal: 3 (40/81/44 8185)  Complications: No apparent anesthesia complications

## 2017-04-05 NOTE — Anesthesia Preprocedure Evaluation (Signed)
Anesthesia Evaluation  Patient identified by MRN, date of birth, ID band Patient awake    Reviewed: Allergy & Precautions, NPO status , Patient's Chart, lab work & pertinent test results  Airway Mallampati: II  TM Distance: >3 FB Neck ROM: Full    Dental  (+) Dental Advisory Given, Partial Upper   Pulmonary Current Smoker,    Pulmonary exam normal breath sounds clear to auscultation       Cardiovascular Exercise Tolerance: Good negative cardio ROS Normal cardiovascular exam Rhythm:Regular Rate:Normal     Neuro/Psych  Headaches, PSYCHIATRIC DISORDERS Anxiety Depression S/p ACDF    GI/Hepatic Neg liver ROS, GERD  Medicated,  Endo/Other  negative endocrine ROS  Renal/GU negative Renal ROS     Musculoskeletal  (+) Arthritis , Osteoarthritis,    Abdominal   Peds  Hematology negative hematology ROS (+)   Anesthesia Other Findings Day of surgery medications reviewed with the patient.  Reproductive/Obstetrics                             Anesthesia Physical Anesthesia Plan  ASA: II  Anesthesia Plan: General   Post-op Pain Management:    Induction: Intravenous  PONV Risk Score and Plan: 3 and Ondansetron, Dexamethasone, Midazolam and Promethazine  Airway Management Planned: Oral ETT  Additional Equipment:   Intra-op Plan:   Post-operative Plan: Extubation in OR  Informed Consent: I have reviewed the patients History and Physical, chart, labs and discussed the procedure including the risks, benefits and alternatives for the proposed anesthesia with the patient or authorized representative who has indicated his/her understanding and acceptance.   Dental advisory given  Plan Discussed with: CRNA  Anesthesia Plan Comments: (Risks/benefits of general anesthesia discussed with patient including risk of damage to teeth, lips, gum, and tongue, nausea/vomiting, allergic reactions to  medications, and the possibility of heart attack, stroke and death.  All patient questions answered.  Patient wishes to proceed.)        Anesthesia Quick Evaluation

## 2017-04-05 NOTE — Progress Notes (Signed)
Pt arrived to 5M07 from PACU.  Pt assisted from stretcher to bed.  Pt alert and oriented, complaints of pain.  VSS.  Will continue to monitor.  Cori Razor, RN

## 2017-04-05 NOTE — Op Note (Signed)
Preoperative diagnosis: Degenerative disc disease lumbar spinal stenosis L3-4 with instability and back pain  Postoperative diagnosis: Same  Procedure: #1 anterior lateral interbody fusion L3-4 utilizing an invasive system with a 10 x 22 mm 15 lordotic cage packed with OsteoSet probe utilizing intraoperative neural monitoring  #2 through separate skin incision repositioning prone expiration of fusion removal of hardware L1-L5 with placement of new rods at L3-4 utilizing previously placed screws at L3 and L4  #3 posterior lateral arthrodesis L3-4 utilizing vivigen  Surgeon: Dominica Severin Seymone Forlenza  Assistant: Marland Kitchen ditty  Anesthesia: Gen.  EBL: Minimal  History of present illness: Patient is a very pleasant 61 year old gentleman presented undergone 2 separate operations and L4-S1 fusion L1 L3 fusion had left lung L3-4. Over the last several months and years a progress worsening back pain bilateral quad pain from progressive breakdown at L3-4. Due the patient's failure conservative treatment imaging findings and progressive clinical syndrome I recommended an interbody fusion L3-4 and due to the fact that he had previous fusion from L1 L3 and L4-S1 I recommended removing all the old hardware removing the old screws leaving the L3 and L4 screws behind and doing a redo posterior lateral fusion and tying together the L3 and L4 screws. And also as a result of his previous surgery I recommended an anterolateral approach for the interbody fusion. I extensively went over the risks and benefits of that operation with him as well as perioperative course expectations of outcome and alternatives of surgery and he understands and agrees to proceed forward.  Operative procedure: Patient brought into the or was induced on general anesthesia positioned left side up in lateral decubitus position with no muscle relaxation left side of his flank was shaved prepped and draped in routine sterile fashion C-arm fluoroscopy was  brought in coordinating position fluoroscopy and positioning 2 incisions were drawn out one the lateral flank one posteriorly for the retroperitoneal access and then after confirmation x-rays and fluoroscopy were confirmed placement finger through the posterior fracture dorsal incision felt the transverse process we did opt and guided a probe dilator down onto the 34 disc space. Fluoroscopy confirmed good position I'd and placed the K wire Steinmann pin did sequential dilation however during sequential dilation I received endplates posteriorly that were under 4 so I repositioned a few different times until ultimately I took a position more anterior in the disc space were a guide Mc greater than 20 readings in the neural monitoring and ends after I had retractor in position and all readings were greater than 20 eyes sweep it posteriorly with continuous monitoring to confirm good position and sweep the psoas posteriorly to anchor into the medial aspect the disc space. After adequate position been achieved retractor was locked in placed and ulnar monitoring both with the built-in probe as well as the hand probe showed readings greater than 20. At this point shim was placed the retractor was expanded fluoroscopy confirmed good position of all of the retractor and position and after confirming safe zones with stimulation I incised the disc space. Then cleaned out with pituitary rongeurs. Utilizing Cobbs I did a contralateral release then scraped the endplates and then using paddles I sequentially opened up the disc space and selected a 10 mm x 22 mm 15 lordotic trial and cage ultimately. The cage was packed with Osteocel pro and inserted under fluoroscopy. Once adequate position the cage in both confirmed with AP and lateral fluoroscopy both incisions were inspected and closed after copious irrigation meticulous hemostasis.  Patient was then repositioned prone for the posterior aspect of the case. After repositioning  redraping and prepping opened up this posterior incision exposed all the hardware from L1 L3 and L4-S1 I noted that the right S1 screw at the left behind was unable be removed at a previous surgeries are left alone I disconnected the nuts disconnected cross-link removed the L5 screws and removed the loose L1 screws. The left behind the L3 and L4 screws. Then I dissected the posterior lateral bone and TPs aggressively decorticated these selected 45 mm rods anchored them in place packed the vivid GEN and Kinex Posterior laterally and then anchored all the rods in place copiously irrigated the wound placed a medium Hemovac drain and closed with layers with after Vicryl and a running 4 septic or Dermabond benzo and Steri-Strips and sterile dressings applied patient recovered in stable condition. At the end of case all needle counts sponge counts were correct.

## 2017-04-05 NOTE — Anesthesia Procedure Notes (Signed)
Procedure Name: Intubation Date/Time: 04/05/2017 10:59 AM Performed by: Salli Quarry Oluwanifemi Petitti Pre-anesthesia Checklist: Patient identified, Emergency Drugs available, Suction available and Patient being monitored Patient Re-evaluated:Patient Re-evaluated prior to inductionOxygen Delivery Method: Circle System Utilized Preoxygenation: Pre-oxygenation with 100% oxygen Intubation Type: IV induction Ventilation: Mask ventilation without difficulty Laryngoscope Size: Mac and 4 Grade View: Grade II Tube type: Oral Tube size: 7.5 mm Number of attempts: 1 Airway Equipment and Method: Stylet and Oral airway Placement Confirmation: ETT inserted through vocal cords under direct vision,  positive ETCO2 and breath sounds checked- equal and bilateral Secured at: 23 cm Tube secured with: Tape Dental Injury: Teeth and Oropharynx as per pre-operative assessment

## 2017-04-06 MED ORDER — PANTOPRAZOLE SODIUM 40 MG PO TBEC
40.0000 mg | DELAYED_RELEASE_TABLET | Freq: Every day | ORAL | Status: DC
  Administered 2017-04-06: 40 mg via ORAL
  Filled 2017-04-06: qty 1

## 2017-04-06 NOTE — Progress Notes (Signed)
Pt seen and examined.  No issues overnight. Pain fairly well controlled No concerns this morning  EXAM: Temp:  [97.4 F (36.3 C)-98.5 F (36.9 C)] 97.9 F (36.6 C) (06/30 0910) Pulse Rate:  [58-95] 60 (06/30 0910) Resp:  [8-20] 16 (06/30 0910) BP: (121-140)/(59-83) 121/68 (06/30 0910) SpO2:  [94 %-99 %] 99 % (06/30 0910) Intake/Output      06/29 0701 - 06/30 0700 06/30 0701 - 07/01 0700   I.V. (mL/kg) 2129 (27.4)    IV Piggyback 200    Total Intake(mL/kg) 2329 (30)    Urine (mL/kg/hr) 1045 150 (0.5)   Drains 275    Blood 100    Total Output 1420 150   Net +909 -150         Awake and alert Follows commands throughout MAEW, full strength Wound - dried blood inferior aspect of wound. No active drainage  Plan Stable Continue current care Mobilize with PT/OT Likely d/c tomorrow

## 2017-04-06 NOTE — Evaluation (Signed)
Physical Therapy Evaluation Patient Details Name: Bradley Espinoza MRN: [id number] DOB: Feb 23, 1956 Today's Date: 04/06/2017   History of Present Illness  Pt is a 61 y/o male s/p #1 anterior lateral interbody fusion L3-4 #2 removal of hardware L1-L5 with placement of new rods at L3-4 #3 posterior lateral arthrodesis L3-4.  has a past medical history including Anxiety; Arthritis; Depression; Fracture; GERD;  Cervical spine surgery (2008); I&D extremity (Right, 09/15/2014); ORIF calcaneous fracture (Right, 09/19/2014); ORIF calcaneous fracture (Left, 09/19/2014); Hardware Removal (Right, 10/09/2014); Back surgery (2004); and Ankle Fusion (Right, 05/11/2015).  Clinical Impression  Pt presented supine in bed with HOB elevated, awake and willing to participate in therapy session. Prior to admission, pt reported that he was independent with all functional mobility and ADLs. Pt ambulated in hallway with RW and min guard for safety, mild instability but no LOB or need for physical assistance. Pt able to recall 3/3 back precautions. PT will continue to follow acutely to ensure a safe d/c home. Plan for stair training at next session.     Follow Up Recommendations No PT follow up    Equipment Recommendations  None recommended by PT    Recommendations for Other Services       Precautions / Restrictions Precautions Precautions: Back Precaution Booklet Issued: No (OT provided) Precaution Comments: pt able to recall 3/3 back precautions Required Braces or Orthoses: Spinal Brace Spinal Brace: Lumbar corset;Applied in sitting position Restrictions Weight Bearing Restrictions: No      Mobility  Bed Mobility Overal bed mobility: Needs Assistance Bed Mobility: Rolling;Sit to Sidelying Rolling: Supervision       Sit to sidelying: Supervision General bed mobility comments: good log roll technique, supervision for safety  Transfers Overall transfer level: Needs assistance Equipment used:  None Transfers: Sit to/from Stand Sit to Stand: Supervision         General transfer comment: supervision for safety, a bit impulsive  Ambulation/Gait Ambulation/Gait assistance: Min guard Ambulation Distance (Feet): 40 Feet Assistive device: Rolling walker (2 wheeled) Gait Pattern/deviations: Step-through pattern;Decreased stride length Gait velocity: decreased Gait velocity interpretation: Below normal speed for age/gender General Gait Details: mild instability but no LOB or need for physical assistance, min guard for safety  Stairs            Wheelchair Mobility    Modified Rankin (Stroke Patients Only)       Balance Overall balance assessment: Needs assistance Sitting-balance support: No upper extremity supported;Feet supported Sitting balance-Leahy Scale: Good Sitting balance - Comments: restless when in sitting position due to pain   Standing balance support: No upper extremity supported;During functional activity Standing balance-Leahy Scale: Fair                               Pertinent Vitals/Pain Pain Assessment: 0-10 Pain Score: 8  Pain Location: back Pain Descriptors / Indicators: Sore Pain Intervention(s): Monitored during session;Repositioned    Home Living Family/patient expects to be discharged to:: Private residence Living Arrangements: Spouse/significant other (girlfriend) Available Help at Discharge: Friend(s);Available PRN/intermittently   Home Access: Stairs to enter Entrance Stairs-Rails: Right Entrance Stairs-Number of Steps: 3 Home Layout: One level Home Equipment: Cane - single point;Bedside commode;Shower seat;Grab bars - toilet;Grab bars - tub/shower;Wheelchair - [employer] - 2 wheels      Prior Function Level of Independence: Independent         Comments: I would have to take breaks where I squat down because the pain  was so bad but yeah I did everything for myself     Hand Dominance   Dominant Hand:  Right    Extremity/Trunk Assessment   Upper Extremity Assessment Upper Extremity Assessment: Overall WFL for tasks assessed    Lower Extremity Assessment Lower Extremity Assessment: Overall WFL for tasks assessed    Cervical / Trunk Assessment Cervical / Trunk Assessment: Other exceptions Cervical / Trunk Exceptions: multiple back surgeries  Communication   Communication: No difficulties  Cognition Arousal/Alertness: Awake/alert Behavior During Therapy: WFL for tasks assessed/performed Overall Cognitive Status: Within Functional Limits for tasks assessed                                 General Comments: "I know how to do what I gotta do"      General Comments General comments (skin integrity, edema, etc.): Pt slightly restless during session - OT attributes this to pain    Exercises     Assessment/Plan    PT Assessment Patient needs continued PT services  PT Problem List Decreased balance;Decreased coordination;Decreased mobility;Decreased knowledge of use of DME;Decreased safety awareness;Decreased knowledge of precautions;Pain       PT Treatment Interventions DME instruction;Gait training;Stair training;Functional mobility training;Therapeutic exercise;Therapeutic activities;Balance training;Neuromuscular re-education;Patient/family education    PT Goals (Current goals can be found in the Care Plan section)  Acute Rehab PT Goals Patient Stated Goal: decrease pain PT Goal Formulation: With patient Time For Goal Achievement: 04/20/17 Potential to Achieve Goals: Good    Frequency Min 5X/week   Barriers to discharge        Co-evaluation               AM-PAC PT "6 Clicks" Daily Activity  Outcome Measure Difficulty turning over in bed (including adjusting bedclothes, sheets and blankets)?: None Difficulty moving from lying on back to sitting on the side of the bed? : A Little Difficulty sitting down on and standing up from a chair with arms  (e.g., wheelchair, bedside commode, etc,.)?: None Help needed moving to and from a bed to chair (including a wheelchair)?: None Help needed walking in hospital room?: A Little Help needed climbing 3-5 steps with a railing? : A Little 6 Click Score: 21    End of Session Equipment Utilized During Treatment: Back brace Activity Tolerance: Patient tolerated treatment well Patient left: in bed;with call bell/phone within reach;with family/visitor present;Other (comment) (sitting EOB) Nurse Communication: Mobility status PT Visit Diagnosis: Other abnormalities of gait and mobility (R26.89);Pain Pain - part of body:  (back)    Time: 2637-8588 PT Time Calculation (min) (ACUTE ONLY): 16 min   Charges:   PT Evaluation $PT Eval Moderate Complexity: 1 Procedure     PT G Codes:        Sherie Don, PT, DPT [place] 04/06/2017, 3:08 PM

## 2017-04-06 NOTE — Evaluation (Signed)
Occupational Therapy Evaluation Patient Details Name: Bradley Espinoza MRN: [id number] DOB: [birth year] Today's Date: 04/06/2017    History of Present Illness Pt is a 61 y/o male s/p #1 anterior lateral interbody fusion L3-4 #2 removal of hardware L1-L5 with placement of new rods at L3-4 #3 posterior lateral arthrodesis L3-4.  has a past medical history including Anxiety; Arthritis; Depression; Fracture; GERD;  Cervical spine surgery (2008); I&D extremity (Right, 09/15/2014); ORIF calcaneous fracture (Right, 09/19/2014); ORIF calcaneous fracture (Left, 09/19/2014); Hardware Removal (Right, 10/09/2014); Back surgery (2004); and Ankle Fusion (Right, 05/11/2015).   Clinical Impression   PTA Pt modified independent in ADL and mobility. Pt currently min A for ADL and min guard for mobility. Back handout provided and reviewed adls in detail. Pt educated on: clothing between brace, never sleep in brace, set an alarm at night for medication, avoid sitting for long periods of time, correct bed positioning for sleeping, correct sequence for bed mobility, AE (grabber/reacher and long handle sponge), avoiding lifting more than 5 pounds and never wash directly over incision. All education is complete and patient indicates understanding. OT will continue to follow in the acute setting to maximize safety and independence in ADL and functional transfers. OT feels that Pt will benefit from safety eval at home and HHOT to return to PLOF but at this time Pt adamantly refusing. Next session to focus on practice with AE for LB dressing and tub transfer.       Follow Up Recommendations  DC plan and follow up therapy as arranged by surgeon (Pt is REFUSING HHOT at this time.)    Equipment Recommendations  Other (comment) (long handle sponge and grabber/reacher)    Recommendations for Other Services       Precautions / Restrictions Precautions Precautions: Back Precaution Booklet Issued: Yes (comment) Precaution  Comments: BLT handout reviewed in full Required Braces or Orthoses: Spinal Brace Spinal Brace: Lumbar corset;Applied in sitting position Restrictions Weight Bearing Restrictions: No      Mobility Bed Mobility Overal bed mobility: Needs Assistance Bed Mobility: Rolling;Sit to Sidelying Rolling: Supervision       Sit to sidelying: Min guard General bed mobility comments: vc for sit to sidelying to maintain precautions  Transfers Overall transfer level: Needs assistance Equipment used: None Transfers: Sit to/from Stand Sit to Stand: Min guard         General transfer comment: Pt performs transfers very quickly    Balance Overall balance assessment: Needs assistance Sitting-balance support: No upper extremity supported;Feet supported Sitting balance-Leahy Scale: Good Sitting balance - Comments: restless when in sitting position due to pain   Standing balance support: No upper extremity supported;During functional activity Standing balance-Leahy Scale: Fair                             ADL either performed or assessed with clinical judgement   ADL Overall ADL's : Needs assistance/impaired Eating/Feeding: Modified independent;Sitting Eating/Feeding Details (indicate cue type and reason): able to eat entire lunch during session Grooming: Min guard;Standing   Upper Body Bathing: Min guard;Sitting   Lower Body Bathing: Minimal assistance;With adaptive equipment Lower Body Bathing Details (indicate cue type and reason): educated in long handle sponge for LB bathing Upper Body Dressing : Min guard;Sitting Upper Body Dressing Details (indicate cue type and reason): to don brace Lower Body Dressing: Min guard;Sit to/from stand Lower Body Dressing Details (indicate cue type and reason): Able to bring feet cross to knees for  LB dressing Toilet Transfer: Min guard   Toileting- Clothing Manipulation and Hygiene: Min guard Toileting - Clothing Manipulation Details  (indicate cue type and reason): hospital gown     Functional mobility during ADLs: Min guard General ADL Comments: Pt slightly impulsive during session and when OT proving education Pt stated "I've been living with this for 3 years, I know how to do for myself" At the end of the session, OT able to help with AE suggestion and bed mobility with Pt acknowledging helpfulness     Vision Baseline Vision/History: Wears glasses Patient Visual Report: No change from baseline Additional Comments: no glasses in room, Pt reports no changes     Perception     Praxis      Pertinent Vitals/Pain Pain Assessment: 0-10 Pain Score: 10-Worst pain ever Pain Location: back Pain Descriptors / Indicators: Constant;Discomfort;Sore;Restless Pain Intervention(s): Monitored during session;Repositioned;Patient requesting pain meds-RN notified (declined offer of ice)     Hand Dominance Right   Extremity/Trunk Assessment Upper Extremity Assessment Upper Extremity Assessment: Overall WFL for tasks assessed   Lower Extremity Assessment Lower Extremity Assessment: Overall WFL for tasks assessed   Cervical / Trunk Assessment Cervical / Trunk Assessment: Other exceptions Cervical / Trunk Exceptions: multiple back surgeries   Communication Communication Communication: No difficulties   Cognition Arousal/Alertness: Awake/alert Behavior During Therapy: WFL for tasks assessed/performed;Restless Overall Cognitive Status: Within Functional Limits for tasks assessed                                 General Comments: "I know how to do what I gotta do"   General Comments  Pt slightly restless during session - OT attributes this to pain    Exercises     Shoulder Instructions      Home Living Family/patient expects to be discharged to:: Private residence Living Arrangements: Spouse/significant other (girlfriend) Available Help at Discharge: Friend(s);Available PRN/intermittently   Home  Access: Stairs to enter Entrance Stairs-Number of Steps: 3 Entrance Stairs-Rails: Right Home Layout: One level     Bathroom Shower/Tub: Teacher, early years/pre: Standard Bathroom Accessibility: Yes How Accessible: Accessible via walker Home Equipment: Cane - single point;Bedside commode;Shower seat;Grab bars - toilet;Grab bars - tub/shower;Wheelchair - manual          Prior Functioning/Environment Level of Independence: Independent        Comments: I would have to take breaks where I squat down because the pain was so bad but yeah I did everything for myself        OT Problem List: Decreased activity tolerance;Impaired balance (sitting and/or standing);Decreased safety awareness;Decreased knowledge of use of DME or AE;Decreased knowledge of precautions;Pain      OT Treatment/Interventions: Self-care/ADL training;DME and/or AE instruction;Therapeutic activities;Patient/family education;Balance training    OT Goals(Current goals can be found in the care plan section) Acute Rehab OT Goals Patient Stated Goal: to get back to being able to get out of the house OT Goal Formulation: With patient Time For Goal Achievement: 04/20/17 Potential to Achieve Goals: Good ADL Goals Pt Will Perform Upper Body Bathing: with modified independence;sitting;with adaptive equipment Pt Will Perform Upper Body Dressing: with modified independence;sitting Pt Will Transfer to Toilet: with modified independence;ambulating Pt Will Perform Toileting - Clothing Manipulation and hygiene: with modified independence;sit to/from stand Additional ADL Goal #1: Pt will recall and maintain 3/3 back precautions during ADL and independent level Additional ADL Goal #2: Pt will perform  bed mobility maintaining back precautions prior to ADL at mod I level  OT Frequency: Min 2X/week   Barriers to D/C:            Co-evaluation              AM-PAC PT "6 Clicks" Daily Activity     Outcome  Measure Help from another person eating meals?: None Help from another person taking care of personal grooming?: None Help from another person toileting, which includes using toliet, bedpan, or urinal?: A Little Help from another person bathing (including washing, rinsing, drying)?: A Little Help from another person to put on and taking off regular upper body clothing?: None Help from another person to put on and taking off regular lower body clothing?: A Little 6 Click Score: 21   End of Session Equipment Utilized During Treatment: Back brace Nurse Communication: Mobility status;Patient requests pain meds (no bed alarm set)  Activity Tolerance: Patient tolerated treatment well Patient left: in bed;with call bell/phone within reach  OT Visit Diagnosis: Unsteadiness on feet (R26.81);Muscle weakness (generalized) (M62.81);Pain Pain - Right/Left: Right Pain - part of body: Leg (back)                Time: 4403-4742 OT Time Calculation (min): 42 min Charges:  OT General Charges $OT Visit: 1 Procedure OT Evaluation $OT Eval Moderate Complexity: 1 Procedure OT Treatments $Self Care/Home Management : 23-37 mins G-Codes:     Hulda Humphrey OTR/L [place] 04/06/2017, 2:13 PM

## 2017-04-07 MED ORDER — CYCLOBENZAPRINE HCL 10 MG PO TABS: 10.0000 mg | ORAL_TABLET | Freq: Three times a day (TID) | ORAL | 2 refills | Status: DC | PRN

## 2017-04-07 MED ORDER — OXYCODONE-ACETAMINOPHEN 10-325 MG PO TABS: 1.0000 | ORAL_TABLET | ORAL | 0 refills | Status: DC | PRN

## 2017-04-07 NOTE — Care Management Note (Signed)
Case Management Note  Patient Details  Name: Bradley Espinoza MRN: [id number] Date of Birth: 02-01-1956  Subjective/Objective:                 Patient with order to DC to home. No DME needs rec by PT. Patient declined HH. Chart reviewed.  No further CM needs.    Action/Plan:  DC to home self care.  Expected Discharge Date:  04/07/17               Expected Discharge Plan:  Home/Self Care  In-House Referral:     Discharge planning Services  CM Consult  Post Acute Care Choice:    Choice offered to:     DME Arranged:    DME Agency:     HH Arranged:  Patient Refused [place] Agency:     Status of Service:  Completed, signed off  If discussed at [employer] of Stay Meetings, dates discussed:    Additional Comments:  Carles Collet, RN 04/07/2017, 8:19 AM

## 2017-04-07 NOTE — Progress Notes (Signed)
Physical Therapy Treatment Patient Details Name: Bradley Espinoza MRN: [id number] DOB: [birth year] Today's Date: 04/07/2017    History of Present Illness Pt is a 61 y/o male s/p #1 anterior lateral interbody fusion L3-4 #2 removal of hardware L1-L5 with placement of new rods at L3-4 #3 posterior lateral arthrodesis L3-4.  has a past medical history including Anxiety; Arthritis; Depression; Fracture; GERD;  Cervical spine surgery (2008); I&D extremity (Right, 09/15/2014); ORIF calcaneous fracture (Right, 09/19/2014); ORIF calcaneous fracture (Left, 09/19/2014); Hardware Removal (Right, 10/09/2014); Back surgery (2004); and Ankle Fusion (Right, 05/11/2015).    PT Comments    Patient progressing towards PT goals. Ambulated increased distance, no physical assist. Performed stair negotiation and educated patient on mobility expectations and car transfers in anticipation of discharge.    Follow Up Recommendations  No PT follow up     Equipment Recommendations  None recommended by PT    Recommendations for Other Services       Precautions / Restrictions Precautions Precautions: Back Precaution Booklet Issued: No (OT provided) Precaution Comments: pt able to recall 3/3 back precautions Required Braces or Orthoses: Spinal Brace Spinal Brace: Lumbar corset;Applied in sitting position Restrictions Weight Bearing Restrictions: No    Mobility  Bed Mobility Overal bed mobility: Needs Assistance Bed Mobility: Rolling;Sidelying to Sit Rolling: Supervision Sidelying to sit: Supervision       General bed mobility comments: Supervision for safety  Transfers Overall transfer level: Needs assistance Equipment used: None Transfers: Sit to/from Stand Sit to Stand: Supervision         General transfer comment: supervision for safety, performed x3 during session, no physical assist required  Ambulation/Gait Ambulation/Gait assistance: Supervision Ambulation Distance (Feet): 180  Feet Assistive device: Rolling walker (2 wheeled) Gait Pattern/deviations: Step-through pattern;Decreased stride length Gait velocity: decreased Gait velocity interpretation: Below normal speed for age/gender General Gait Details: modest instability when ambulating in room without device (35ft), improved stability with RW. cues for precautions   Stairs Stairs: Yes   Stair Management: One rail Right Number of Stairs: 4 General stair comments: VCs for technique and sequencing, supervision for safety  Wheelchair Mobility    Modified Rankin (Stroke Patients Only)       Balance Overall balance assessment: Needs assistance Sitting-balance support: No upper extremity supported;Feet supported Sitting balance-Leahy Scale: Good     Standing balance support: No upper extremity supported;During functional activity Standing balance-Leahy Scale: Fair Standing balance comment: able to mobilize dynamically wihout UE support                            Cognition Arousal/Alertness: Awake/alert Behavior During Therapy: WFL for tasks assessed/performed Overall Cognitive Status: Within Functional Limits for tasks assessed                                        Exercises      General Comments        Pertinent Vitals/Pain Pain Assessment: 0-10 Pain Score: 8  Pain Location: back and legs Pain Descriptors / Indicators: Sore Pain Intervention(s): Monitored during session;Patient requesting pain meds-RN notified    Home Living                      Prior Function            PT Goals (current goals can now be found in the care  plan section) Acute Rehab PT Goals Patient Stated Goal: decrease pain PT Goal Formulation: With patient Time For Goal Achievement: 04/20/17 Potential to Achieve Goals: Good Progress towards PT goals: Progressing toward goals    Frequency    Min 5X/week      PT Plan Current plan remains appropriate     Co-evaluation              AM-PAC PT "6 Clicks" Daily Activity  Outcome Measure  Difficulty turning over in bed (including adjusting bedclothes, sheets and blankets)?: None Difficulty moving from lying on back to sitting on the side of the bed? : A Little Difficulty sitting down on and standing up from a chair with arms (e.g., wheelchair, bedside commode, etc,.)?: None Help needed moving to and from a bed to chair (including a wheelchair)?: None Help needed walking in hospital room?: A Little Help needed climbing 3-5 steps with a railing? : A Little 6 Click Score: 21    End of Session Equipment Utilized During Treatment: Back brace Activity Tolerance: Patient tolerated treatment well Patient left: in bed;with call bell/phone within reach;with family/visitor present;Other (comment) (sitting EOB) Nurse Communication: Mobility status PT Visit Diagnosis: Other abnormalities of gait and mobility (R26.89);Pain Pain - part of body:  (back)     Time: 0092-3300 PT Time Calculation (min) (ACUTE ONLY): 20 min  Charges:  $Gait Training: 8-22 mins                    G Codes:       Alben Deeds, PT DPT NCS [phone]    Duncan Dull 04/07/2017, 9:13 AM

## 2017-04-07 NOTE — Clinical Social Work Note (Addendum)
CSW recoeved consult for Skilled Nursing facility placement. P/T recommending no follow up. CSW signing off as no further Social Work needs identified. Please reconsult if new Social Work needs arise.   Oretha Ellis, [place], [place] Work Letitia Libra coverage) [phone]

## 2017-04-07 NOTE — Discharge Summary (Signed)
Physician Discharge Summary  Patient ID: Bradley Espinoza MRN: [id number] DOB/AGE: 62-Oct-1957 62 y.o.  Admit date: 04/05/2017 Discharge date: 04/07/2017  Admission Diagnoses:  DDD - lumbar  Discharge Diagnoses:  Same Active Problems:   DDD (degenerative disc disease), lumbar   Discharged Condition: Stable  Hospital Course:  Bradley Espinoza is a 61 y.o. male who was admitted for the below procedure. There were no post operative complications. At time of discharge, pain was well controlled, ambulating with Pt/OT, tolerating po, voiding normal. Ready for discharge. Declined HH as rec by OT. PT has cleared him without supervision. Will call for any concerns at home.  Treatments: Surgery #1 anterior lateral interbody fusion L3-4 utilizing an invasive system with a 10 x 22 mm 15 lordotic cage packed with OsteoSet probe utilizing intraoperative neural monitoring #2 through separate skin incision repositioning prone expiration of fusion removal of hardware L1-L5 with placement of new rods at L3-4 utilizing previously placed screws at L3 and L4 #3 posterior lateral arthrodesis L3-4 utilizing vivigen  Discharge Exam: Blood pressure 125/66, pulse 61, temperature 98.6 F (37 C), temperature source Oral, resp. rate 16, weight 77.6 kg (171 lb), SpO2 95 %. Awake, alert, oriented Speech fluent, appropriate CN grossly intact MAEW Wound c/d/i  Disposition: 01-Home or Self Care  Discharge Instructions    Call MD for:  difficulty breathing, headache or visual disturbances    Complete by:  As directed    Call MD for:  persistant dizziness or light-headedness    Complete by:  As directed    Call MD for:  redness, tenderness, or signs of infection (pain, swelling, redness, odor or green/yellow discharge around incision site)    Complete by:  As directed    Call MD for:  severe uncontrolled pain    Complete by:  As directed    Call MD for:  temperature >100.4    Complete by:  As  directed    Diet general    Complete by:  As directed    Driving Restrictions    Complete by:  As directed    Do not drive until given clearance.   Increase activity slowly    Complete by:  As directed    Lifting restrictions    Complete by:  As directed    Do not lift anything >10lbs. Avoid bending and twisting in awkward positions. Avoid bending at the back.   May shower / Bathe    Complete by:  As directed    In 24 hours. Okay to wash wound with warm soapy water. Avoid scrubbing the wound. Pat dry.   Remove dressing in 24 hours    Complete by:  As directed      Allergies as of 04/07/2017      Reactions   Codeine Nausea And Vomiting      Medication List    STOP taking these medications   acetaminophen 500 MG tablet Commonly known as:  TYLENOL   oxyCODONE-acetaminophen 5-325 MG tablet Commonly known as:  PERCOCET/ROXICET Replaced by:  oxyCODONE-acetaminophen 10-325 MG tablet     TAKE these medications   cyclobenzaprine 10 MG tablet Commonly known as:  FLEXERIL Take 1 tablet (10 mg total) by mouth 3 (three) times daily as needed for muscle spasms.   gabapentin 300 MG capsule Commonly known as:  NEURONTIN Take 300 mg by mouth 2 (two) times daily.   ibuprofen 200 MG tablet Commonly known as:  ADVIL,MOTRIN Take 800 mg by mouth 2 (two) times daily  as needed for headache.   meloxicam 15 MG tablet Commonly known as:  MOBIC Take 15 mg by mouth daily.   naproxen sodium 220 MG tablet Commonly known as:  ANAPROX Take 220 mg by mouth 2 (two) times daily as needed (pain).   oxyCODONE-acetaminophen 10-325 MG tablet Commonly known as:  PERCOCET Take 1 tablet by mouth every 4 (four) hours as needed for pain. Replaces:  oxyCODONE-acetaminophen 5-325 MG tablet   ranitidine 75 MG tablet Commonly known as:  ZANTAC Take 75 mg by mouth daily as needed for heartburn.      Follow-up Information    Donalee Citrin, MD Follow up.   Specialty:  Neurosurgery Contact  information: [address] [place] [address] [phone]           Signed: Alyson Ingles 04/07/2017, 7:49 AM

## 2017-04-08 ENCOUNTER — Encounter (HOSPITAL_COMMUNITY): Payer: Self-pay | Admitting: Neurosurgery

## 2017-04-08 NOTE — Anesthesia Postprocedure Evaluation (Signed)
Anesthesia Post Note  Patient: Bradley Espinoza  Procedure(s) Performed: Procedure(s) (LRB): Extreme Lateral Interbody Fusion - Lumbar three-lumbar four ,exploration of fusion Posterior augmentation with globus addition Removal hardware Lumbar one-three. Lumbar four-sacral one,  Removal internal bone growth stimulator (N/A) LUMBAR PERCUTANEOUS PEDICLE SCREW LUMBAR THREE-FOUR (N/A)     Patient location during evaluation: PACU Anesthesia Type: General Level of consciousness: awake and alert Pain management: pain level controlled Vital Signs Assessment: post-procedure vital signs reviewed and stable Respiratory status: spontaneous breathing, nonlabored ventilation, respiratory function stable and patient connected to nasal cannula oxygen Cardiovascular status: blood pressure returned to baseline and stable Postop Assessment: no signs of nausea or vomiting Anesthetic complications: no    Last Vitals:  Vitals:   04/07/17 0655 04/07/17 1018  BP: 125/66 138/66  Pulse: 61 79  Resp: 16 18  Temp: 37 C 37.2 C    Last Pain:  Vitals:   04/07/17 1018  TempSrc: Oral  PainSc:                  Manford Sprong

## 2017-04-10 LAB — AEROBIC/ANAEROBIC CULTURE (SURGICAL/DEEP WOUND): CULTURE: NO GROWTH

## 2017-04-10 LAB — AEROBIC/ANAEROBIC CULTURE W GRAM STAIN (SURGICAL/DEEP WOUND)

## 2017-05-07 DIAGNOSIS — M5137 Other intervertebral disc degeneration, lumbosacral region: Secondary | ICD-10-CM | POA: Diagnosis not present

## 2017-05-21 DIAGNOSIS — I10 Essential (primary) hypertension: Secondary | ICD-10-CM | POA: Diagnosis not present

## 2017-05-21 DIAGNOSIS — M5416 Radiculopathy, lumbar region: Secondary | ICD-10-CM | POA: Diagnosis not present

## 2017-05-21 DIAGNOSIS — M5137 Other intervertebral disc degeneration, lumbosacral region: Secondary | ICD-10-CM | POA: Diagnosis not present

## 2017-05-28 ENCOUNTER — Ambulatory Visit (HOSPITAL_COMMUNITY): Payer: PPO | Attending: Neurosurgery | Admitting: Physical Therapy

## 2017-05-28 DIAGNOSIS — R29898 Other symptoms and signs involving the musculoskeletal system: Secondary | ICD-10-CM

## 2017-05-28 DIAGNOSIS — R293 Abnormal posture: Secondary | ICD-10-CM | POA: Diagnosis not present

## 2017-05-28 DIAGNOSIS — M6281 Muscle weakness (generalized): Secondary | ICD-10-CM

## 2017-05-28 DIAGNOSIS — R262 Difficulty in walking, not elsewhere classified: Secondary | ICD-10-CM | POA: Diagnosis not present

## 2017-05-28 DIAGNOSIS — M545 Low back pain, unspecified: Secondary | ICD-10-CM

## 2017-05-28 DIAGNOSIS — G8929 Other chronic pain: Secondary | ICD-10-CM | POA: Diagnosis not present

## 2017-05-28 NOTE — Patient Instructions (Signed)
   BRIDGING  While lying on your back, tighten your lower abdominals, squeeze your buttocks and then raise your buttocks off the floor/bed as creating a "Bridge" with your body.   Lift your hips as high as you can without using your arms to help.  Repeat 10-15 times, twice a day.    Transverse Abdominus Activation  Lying on your back, pull your bellybutton into your spine.   It should feel like you are squeezing your bellybutton to your backbone.  Hold for 3-5 seconds.  Repeat 20 times, 5 sets per day.    BRACE SUPINE MARCHING  While lying on your back with your knees bent,  slowly raise up one foot a few inches and then set it back down.  Next, perform on your other leg.  Use your stomach muscles to keep your spine from moving.  Make sure you are keeping your stomach tight.  Repeat 10 times each side, twice a day.    SUPINE HIP ABDUCTION - ELASTIC BAND CLAMS  Lie down on your back with your knees bent. Place an elastic band around your knees and then draw your knees apart.  Hold for 2-3 seconds.  Repeat 10-15 times, twice a day.

## 2017-05-28 NOTE — Therapy (Signed)
[facility] [address] [place], [place], [address] Phone: [phone]   Fax:  [phone]  Physical Therapy Evaluation  Patient Details  Name: Bradley Espinoza MRN: [id number] Date of Birth: [birth year] Referring Provider: Spero Geralds   Encounter Date: 05/28/2017      PT End of Session - 05/28/17 1701    Visit Number 1   Number of Visits 9   Date for PT Re-Evaluation 06/25/17   Authorization Type [employer]    Authorization Time Period 05/28/17 to 06/28/17   Authorization - Visit Number 1   Authorization - Number of Visits 10   PT Start Time 7035   PT Stop Time 1551   PT Time Calculation (min) 34 min   Activity Tolerance Patient tolerated treatment well   Behavior During Therapy [facility] for tasks assessed/performed      Past Medical History:  Diagnosis Date  . Anxiety   . Arthritis    low back pain, lumbar radiculopathy  . Depression   . Fracture    B/L ankles  . GERD (gastroesophageal reflux disease)   . Headache(784.0)    allergy related   . History of kidney stones   . History of stomach ulcers   . Retained orthopedic hardware    failed retained hardware right foot  . Wears glasses     Past Surgical History:  Procedure Laterality Date  . ANKLE FUSION Right 05/11/2015   Procedure: Right Posterior Arthroscopic Subtalar Arthrodesis;  Surgeon: Newt Minion, MD;  Location: [facility];  Service: Orthopedics;  Laterality: Right;  . ANTERIOR LAT LUMBAR FUSION N/A 04/05/2017   Procedure: Extreme Lateral Interbody Fusion - Lumbar three-lumbar four ,exploration of fusion Posterior augmentation with globus addition Removal hardware Lumbar one-three. Lumbar four-sacral one,  Removal internal bone growth stimulator;  Surgeon: Kary Kos, MD;  Location: [facility];  Service: Neurosurgery;  Laterality: N/A;  . BACK SURGERY  2004   x 2  . CERVICAL SPINE SURGERY  2008  . ESOPHAGOGASTRODUODENOSCOPY    . HARDWARE REMOVAL Right  10/09/2014   Procedure: Removal Deep Hardware, Irrigation and Debridement Calcaneus, Place Antibiotic Beads and Wound VAC ;  Surgeon: Newt Minion, MD;  Location: [facility];  Service: Orthopedics;  Laterality: Right;  . HARDWARE REMOVAL Right 08/12/2015   Procedure: Removal Deep Hardware Right Foot;  Surgeon: Newt Minion, MD;  Location: [facility];  Service: Orthopedics;  Laterality: Right;  . I&D EXTREMITY Right 09/15/2014   Procedure: IRRIGATION AND DEBRIDEMENT Ankle;  Surgeon: Renette Butters, MD;  Location: [facility];  Service: Orthopedics;  Laterality: Right;  . INGUINAL HERNIA REPAIR Bilateral   . LUMBAR PERCUTANEOUS PEDICLE SCREW 1 LEVEL N/A 04/05/2017   Procedure: LUMBAR PERCUTANEOUS PEDICLE SCREW LUMBAR THREE-FOUR;  Surgeon: Kary Kos, MD;  Location: [facility];  Service: Neurosurgery;  Laterality: N/A;  . ORIF CALCANEOUS FRACTURE Right 09/19/2014   Procedure: OPEN REDUCTION INTERNAL FIXATION (ORIF) CALCANEOUS FRACTURE;  Surgeon: Newt Minion, MD;  Location: [facility];  Service: Orthopedics;  Laterality: Right;  . ORIF CALCANEOUS FRACTURE Left 09/19/2014   Procedure: OPEN REDUCTION INTERNAL FIXATION (ORIF) CALCANEOUS FRACTURE;  Surgeon: Newt Minion, MD;  Location: [facility];  Service: Orthopedics;  Laterality: Left;    There were no vitals filed for this visit.       Subjective Assessment - 05/28/17 1519    Subjective Patient reports that he fell about 2 years ago and broke his back as well as both feet; he  had back surgery in 2016, he came here for therapy in 2016 but his pain got worse. He had another back surgery in June 2018, after which he was able to stand up straight without in hurting but now the right side of his back is hurting. He feels like he is used to walking with a limp/not standing up straight. No falls recently, he has had some close calls with falling.    Pertinent History history of B calcaneal fratures and lumbar fractures after fall off a ladder; history of mulitple surgeries for feet  and lumbar spine    How long can you sit comfortably? reports about 30-40 minutes best   How long can you stand comfortably? 5 minutes    How long can you walk comfortably? "not far", maybe 5 minutes    Patient Stated Goals reduce pain, be able to walk straight   Currently in Pain? Yes   Pain Score 8    Pain Location Back   Pain Orientation Right;Lower   Pain Descriptors / Indicators Burning   Pain Type Chronic pain   Pain Radiating Towards can sometimes run down into R leg maybe 50% of the way    Pain Onset More than a month ago   Pain Frequency Constant   Aggravating Factors  being still for too long, doing too much    Pain Relieving Factors laying on R side    Effect of Pain on Daily Activities severe             [facility] PT Assessment - 05/28/17 0001      Assessment   Medical Diagnosis lumbar radiculopathy, gait training    Referring Provider Spero Geralds    Onset Date/Surgical Date --  chronic    Next MD Visit Spero Geralds in 3 months    Prior Therapy PT here in 2016     Precautions   Precautions Back   Precaution Comments per surgeon no bending/lifting/twisting, should be in back brace      Balance Screen   Has the patient fallen in the past 6 months No   Has the patient had a decrease in activity level because of a fear of falling?  Yes   Is the patient reluctant to leave their home because of a fear of falling?  No     Prior Function   Level of Independence Independent;Independent with basic ADLs;Independent with gait;Independent with transfers   Vocation On disability     Strength   Right Hip Flexion 5/5   Right Hip ABduction 4+/5   Left Hip Flexion 4+/5   Left Hip ABduction 4+/5   Right Knee Flexion 4+/5   Right Knee Extension 4+/5   Left Knee Flexion 4+/5   Left Knee Extension 4+/5     Flexibility   Hamstrings WFL    Piriformis mild tightness R hip, moderate limitation L hip      Ambulation/Gait   Gait Comments antalgic pattern, flexed at hips,  general unsteadiness, reduced heel-toe pattern      High Level Balance   High Level Balance Comments TUG 11.3 no device             Objective measurements completed on examination: See above findings.                  PT Education - 05/28/17 1700    Education provided Yes   Education Details prognosis, POC, HEP; possible benefits of PT for his feet; education to follow MD  precautions on brace wear    Person(s) Educated Patient   Methods Explanation;Demonstration   Comprehension Verbalized understanding;Returned demonstration;Need further instruction          PT Short Term Goals - 05/28/17 1706      PT SHORT TERM GOAL #1   Title patient to demonstrate improved gait mechanics including upright posture, equal step lengths, straight trajectory, and without reaching for items in environment in order to improve mobility and community access    Time 2   Period Weeks   Status New   Target Date 06/11/17     PT SHORT TERM GOAL #2   Title patient to be able to maintain correct posture at least 75% of the time in order to demonstrate improved core strength and to improve mechanics    Time 2   Period Weeks   Status New     PT SHORT TERM GOAL #3   Title Patient to experience pain as being no more than 6/10 in order to improve QOL and functional task tolerance    Time 2   Period Weeks   Status New           PT Long Term Goals - 05/28/17 1708      PT LONG TERM GOAL #1   Title Patient to demonstate MMT as having improved by at least 1 grade in order to improve mobility and overall gait pattern    Time 4   Period Weeks   Status New   Target Date 06/25/17     PT LONG TERM GOAL #2   Title Patient to experience pain as being no more than 4/10 in order to improve QOL and tolerance to functional tasks    Time 4   Period Weeks   Status New     PT LONG TERM GOAL #3   Title Patient to be independent in advanced HEP, to be updated PRN, in order to maintain  functional gains and prevent exacdrbation of pain    Time 4   Period Weeks   Status New                Plan - 05/28/17 1703    Clinical Impression Statement Patient arrives after having multiple lumbar surgeries, the most recent which was performed in June 2018; note that all lumbar surgeries have been fusions and extend from L1-L4, also that patient has had foot surgeries due to his fall off of a ladder which caused his current problems. Examination reveals severe gait mechanic impairment, functional muscle weakness, poor posture, and reduced tolerance to functional task performance and activities. Patient repeatedly states that he thinks PT increased his pain last time, required encouragement to participate in exercises/activities today. Recommend trial of skilled PT services to address functional deficits, reduce pain, and improve overall level of function moving forward.    History and Personal Factors relevant to plan of care: mixed outcomes with PT earlier this year; hx significant injuries with multiple surgeries following; chronicity of pain    Clinical Presentation Stable   Clinical Presentation due to: mode of injuury, deoncidtioning, biomechanical changes and post-op state    Clinical Decision Making Low   Rehab Potential Fair   Clinical Impairments Affecting Rehab Potential (+) age, overall health status; (-) chronicity of pain, multiple surgeries, mixed outcomes of PT earlier this year, mixed motivation    PT Frequency 2x / week   PT Duration 4 weeks   PT Treatment/Interventions ADLs/Self Care Home Management;Biofeedback;Cryotherapy;Electrical Stimulation;Moist Heat;DME Instruction;Gait  training;Stair training;Functional mobility training;Therapeutic activities;Therapeutic exercise;Balance training;Neuromuscular re-education;Patient/family education;Manual techniques;Energy conservation;Taping   PT Next Visit Plan review initial eval and goals, HEP; F/U on brace wear. core and  LE strengthening program. Gait and postural training.    PT Home Exercise Plan Eval: bridiges, brace supine marches, supine clams with red TB, TA activation    Consulted and Agree with Plan of Care Patient      Patient will benefit from skilled therapeutic intervention in order to improve the following deficits and impairments:  Abnormal gait, Increased fascial restricitons, Improper body mechanics, Pain, Decreased coordination, Decreased mobility, Postural dysfunction, Decreased activity tolerance, Decreased strength, Decreased range of motion, Hypomobility, Decreased balance, Difficulty walking, Impaired flexibility  Visit Diagnosis: Chronic bilateral low back pain without sciatica - Plan: PT plan of care cert/re-cert  Abnormal posture - Plan: PT plan of care cert/re-cert  Muscle weakness (generalized) - Plan: PT plan of care cert/re-cert  Difficulty in walking, not elsewhere classified - Plan: PT plan of care cert/re-cert  Other symptoms and signs involving the musculoskeletal system - Plan: PT plan of care cert/re-cert      G-Codes - 20/25/42 1710    Functional Assessment Tool Used (Outpatient Only) Based on skilled clinical assessment of posture, strength, gait, fall risk, pain patterns   Functional Limitation Mobility: Walking and moving around   Mobility: Walking and Moving Around Current Status (H0623) At least 40 percent but less than 60 percent impaired, limited or restricted   Mobility: Walking and Moving Around Goal Status [phone]) At least 20 percent but less than 40 percent impaired, limited or restricted       Problem List Patient Active Problem List   Diagnosis Date Noted  . DDD (degenerative disc disease), lumbar 04/05/2017  . Fracture of L2 vertebra ([facility]) 01/12/2015  . Acute osteomyelitis of calcaneum ([facility]) 10/09/2014  . Dysuria 10/05/2014  . Cellulitis 10/05/2014  . Fall from ladder 09/21/2014  . L2 vertebral fracture ([facility]) 09/21/2014  . Bilateral calcaneal  fractures 09/21/2014  . Chronic pain 09/21/2014  . Acute blood loss anemia 09/21/2014  . Open right calcaneal fracture 09/15/2014    Deniece Ree PT, DPT [facility] [address] [place], [place], [address] Phone: [phone]   Fax:  [phone]  Name: NASRI BOAKYE MRN: [id number] Date of Birth: 12-Apr-1956

## 2017-05-30 ENCOUNTER — Ambulatory Visit (HOSPITAL_COMMUNITY): Payer: PPO

## 2017-05-30 DIAGNOSIS — R262 Difficulty in walking, not elsewhere classified: Secondary | ICD-10-CM

## 2017-05-30 DIAGNOSIS — M545 Low back pain, unspecified: Secondary | ICD-10-CM

## 2017-05-30 DIAGNOSIS — R293 Abnormal posture: Secondary | ICD-10-CM

## 2017-05-30 DIAGNOSIS — R29898 Other symptoms and signs involving the musculoskeletal system: Secondary | ICD-10-CM

## 2017-05-30 DIAGNOSIS — M6281 Muscle weakness (generalized): Secondary | ICD-10-CM

## 2017-05-30 DIAGNOSIS — G8929 Other chronic pain: Secondary | ICD-10-CM

## 2017-05-30 NOTE — Therapy (Signed)
[facility] [place], [place], [address] Phone: [phone]   Fax:  [phone]  Physical Therapy Treatment  Patient Details  Name: Bradley Espinoza MRN: [id number] Date of Birth: [birth year] Referring Provider: Spero Geralds   Encounter Date: 05/30/2017      PT End of Session - 05/30/17 1139    Visit Number 2   Number of Visits 9   Date for PT Re-Evaluation 06/25/17   Authorization Type [employer]    Authorization Time Period 05/28/17 to 06/28/17   Authorization - Visit Number 2   Authorization - Number of Visits 10   PT Start Time 8299   PT Stop Time 1206   PT Time Calculation (min) 49 min   Activity Tolerance Patient tolerated treatment well;No increased pain   Behavior During Therapy WFL for tasks assessed/performed      Past Medical History:  Diagnosis Date  . Anxiety   . Arthritis    low back pain, lumbar radiculopathy  . Depression   . Fracture    B/L ankles  . GERD (gastroesophageal reflux disease)   . Headache(784.0)    allergy related   . History of kidney stones   . History of stomach ulcers   . Retained orthopedic hardware    failed retained hardware right foot  . Wears glasses     Past Surgical History:  Procedure Laterality Date  . ANKLE FUSION Right 05/11/2015   Procedure: Right Posterior Arthroscopic Subtalar Arthrodesis;  Surgeon: Newt Minion, MD;  Location: [facility];  Service: Orthopedics;  Laterality: Right;  . ANTERIOR LAT LUMBAR FUSION N/A 04/05/2017   Procedure: Extreme Lateral Interbody Fusion - Lumbar three-lumbar four ,exploration of fusion Posterior augmentation with globus addition Removal hardware Lumbar one-three. Lumbar four-sacral one,  Removal internal bone growth stimulator;  Surgeon: Kary Kos, MD;  Location: [facility];  Service: Neurosurgery;  Laterality: N/A;  . BACK SURGERY  2004   x 2  . CERVICAL SPINE SURGERY  2008  . ESOPHAGOGASTRODUODENOSCOPY    . HARDWARE  REMOVAL Right 10/09/2014   Procedure: Removal Deep Hardware, Irrigation and Debridement Calcaneus, Place Antibiotic Beads and Wound VAC ;  Surgeon: Newt Minion, MD;  Location: [facility];  Service: Orthopedics;  Laterality: Right;  . HARDWARE REMOVAL Right 08/12/2015   Procedure: Removal Deep Hardware Right Foot;  Surgeon: Newt Minion, MD;  Location: [facility];  Service: Orthopedics;  Laterality: Right;  . I&D EXTREMITY Right 09/15/2014   Procedure: IRRIGATION AND DEBRIDEMENT Ankle;  Surgeon: Renette Butters, MD;  Location: [facility];  Service: Orthopedics;  Laterality: Right;  . INGUINAL HERNIA REPAIR Bilateral   . LUMBAR PERCUTANEOUS PEDICLE SCREW 1 LEVEL N/A 04/05/2017   Procedure: LUMBAR PERCUTANEOUS PEDICLE SCREW LUMBAR THREE-FOUR;  Surgeon: Kary Kos, MD;  Location: [facility];  Service: Neurosurgery;  Laterality: N/A;  . ORIF CALCANEOUS FRACTURE Right 09/19/2014   Procedure: OPEN REDUCTION INTERNAL FIXATION (ORIF) CALCANEOUS FRACTURE;  Surgeon: Newt Minion, MD;  Location: [facility];  Service: Orthopedics;  Laterality: Right;  . ORIF CALCANEOUS FRACTURE Left 09/19/2014   Procedure: OPEN REDUCTION INTERNAL FIXATION (ORIF) CALCANEOUS FRACTURE;  Surgeon: Newt Minion, MD;  Location: [facility];  Service: Orthopedics;  Laterality: Left;    There were no vitals filed for this visit.      Subjective Assessment - 05/30/17 1126    Subjective Pt stated LBP was 10/10 this morning but has reduced to 9/10 Rt sided LBP sharp pain.  Reports compliance with HEP without questions.     Pertinent History history of B calcaneal fratures and lumbar fractures after fall off a ladder; history of mulitple surgeries for feet and lumbar spine    Patient Stated Goals reduce pain, be able to walk straight   Currently in Pain? Yes   Pain Score 9    Pain Location Back   Pain Orientation Lower;Right   Pain Descriptors / Indicators Sharp   Pain Type Chronic pain   Pain Radiating Towards no radicular symptoms currently (does sometimes  run down into Rt leg maybe 50% of the way)   Pain Onset More than a month ago   Pain Frequency Constant   Aggravating Factors  being still for too long, doing too much   Pain Relieving Factors laying on Rt side    Effect of Pain on Daily Activities severe                         [facility] Adult PT Treatment/Exercise - 05/30/17 0001      Bed Mobility   Bed Mobility Left Sidelying to Sit;Sit to Sidelying Left   Left Sidelying to Sit 5: Supervision   Left Sidelying to Sit Details (indicate cue type and reason) Reviewed importance of proper bed mobilty   Sit to Sidelying Left 5: Supervision   Sit to Sidelying Left Details (indicate cue type and reason) Reviewed importance of proper bed mobilty     Ambulation/Gait   Ambulation Distance (Feet) 226 Feet   Assistive device None   Gait Pattern Decreased stride length;Trunk flexed   Ambulation Surface Level;Indoor   Gait Comments cueing for posture, increase stride length and improve hip extension with gait; antalgic pattern, flexed at hips, general unsteadiness, reduced heel-toe pattern      Exercises   Exercises Lumbar     Lumbar Exercises: Seated   Other Seated Lumbar Exercises scapular retraction with GTB     Lumbar Exercises: Supine   Ab Set 10 reps;5 seconds   AB Set Limitations verbal and tactile cueing to improve TrA activaiton   Bent Knee Raise 10 reps;3 seconds   Bent Knee Raise Limitations with ab set   Bridge 10 reps;3 seconds  RTB   Bridge Limitations with ab set   Other Supine Lumbar Exercises supine clam with ab set RTB 10x each     Lumbar Exercises: Sidelying   Hip Abduction 10 reps                PT Education - 05/30/17 1222    Education provided Yes   Education Details Reviewed goals, assured compliance and educated importance of stabilty for core strengthening, copy of eval given to pt. reviewed precautions (no BLT) and wear time with brace   Person(s) Educated Patient   Methods  Explanation;Demonstration;Tactile cues;Verbal cues;Handout   Comprehension Verbalized understanding;Returned demonstration;Need further instruction          PT Short Term Goals - 05/28/17 1706      PT SHORT TERM GOAL #1   Title patient to demonstrate improved gait mechanics including upright posture, equal step lengths, straight trajectory, and without reaching for items in environment in order to improve mobility and community access    Time 2   Period Weeks   Status New   Target Date 06/11/17     PT SHORT TERM GOAL #2   Title patient to be able to maintain correct posture at least 75% of the time in  order to demonstrate improved core strength and to improve mechanics    Time 2   Period Weeks   Status New     PT SHORT TERM GOAL #3   Title Patient to experience pain as being no more than 6/10 in order to improve QOL and functional task tolerance    Time 2   Period Weeks   Status New           PT Long Term Goals - 05/28/17 1708      PT LONG TERM GOAL #1   Title Patient to demonstate MMT as having improved by at least 1 grade in order to improve mobility and overall gait pattern    Time 4   Period Weeks   Status New   Target Date 06/25/17     PT LONG TERM GOAL #2   Title Patient to experience pain as being no more than 4/10 in order to improve QOL and tolerance to functional tasks    Time 4   Period Weeks   Status New     PT LONG TERM GOAL #3   Title Patient to be independent in advanced HEP, to be updated PRN, in order to maintain functional gains and prevent exacdrbation of pain    Time 4   Period Weeks   Status New               Plan - 05/30/17 1140    Clinical Impression Statement Reviewed goals, assured compliance iwth HEP, copy of eval given to pt.  Reviewed precautions following back surgery (no bending, lifting, twisting) and wear time with brace (therex complete without brace) Educated importance of proper bed mobility and posture to reduce  strain on back.  Verbal and tactile cueing for correct TrA activaiton and min cueing for form and stability with HEP.  EOS pt reports pain reduced to 6/10.     Rehab Potential Fair   Clinical Impairments Affecting Rehab Potential (+) age, overall health status; (-) chronicity of pain, multiple surgeries, mixed outcomes of PT earlier this year, mixed motivation    PT Frequency 2x / week   PT Duration 4 weeks   PT Treatment/Interventions ADLs/Self Care Home Management;Biofeedback;Cryotherapy;Electrical Stimulation;Moist Heat;DME Instruction;Gait training;Stair training;Functional mobility training;Therapeutic activities;Therapeutic exercise;Balance training;Neuromuscular re-education;Patient/family education;Manual techniques;Energy conservation;Taping   PT Next Visit Plan Next session progress core/proximal strengthening with theraband resistance.  Progress to sidelying clam.  Assess hip flexor tightness, might add Thomas stretch to improve hip extension with gait.  Gait and postural training.     PT Home Exercise Plan Eval: bridiges, brace supine marches, supine clams with red TB, TA activation       Patient will benefit from skilled therapeutic intervention in order to improve the following deficits and impairments:  Abnormal gait, Increased fascial restricitons, Improper body mechanics, Pain, Decreased coordination, Decreased mobility, Postural dysfunction, Decreased activity tolerance, Decreased strength, Decreased range of motion, Hypomobility, Decreased balance, Difficulty walking, Impaired flexibility  Visit Diagnosis: Chronic bilateral low back pain without sciatica  Abnormal posture  Muscle weakness (generalized)  Difficulty in walking, not elsewhere classified  Other symptoms and signs involving the musculoskeletal system     Problem List Patient Active Problem List   Diagnosis Date Noted  . DDD (degenerative disc disease), lumbar 04/05/2017  . Fracture of L2 vertebra ([facility])  01/12/2015  . Acute osteomyelitis of calcaneum ([facility]) 10/09/2014  . Dysuria 10/05/2014  . Cellulitis 10/05/2014  . Fall from ladder 09/21/2014  . L2 vertebral fracture ([facility]) 09/21/2014  .  Bilateral calcaneal fractures 09/21/2014  . Chronic pain 09/21/2014  . Acute blood loss anemia 09/21/2014  . Open right calcaneal fracture 09/15/2014    Aldona Lento 05/30/2017, 12:24 PM  [facility] [address] [place], [place], [address] Phone: [phone]   Fax:  [phone]  Name: Bradley Espinoza MRN: [id number] Date of Birth: 24-Aug-1956

## 2017-06-04 ENCOUNTER — Ambulatory Visit (HOSPITAL_COMMUNITY): Payer: PPO | Admitting: Physical Therapy

## 2017-06-04 DIAGNOSIS — M6281 Muscle weakness (generalized): Secondary | ICD-10-CM

## 2017-06-04 DIAGNOSIS — G8929 Other chronic pain: Secondary | ICD-10-CM

## 2017-06-04 DIAGNOSIS — M545 Low back pain: Secondary | ICD-10-CM | POA: Diagnosis not present

## 2017-06-04 DIAGNOSIS — R293 Abnormal posture: Secondary | ICD-10-CM

## 2017-06-04 DIAGNOSIS — R262 Difficulty in walking, not elsewhere classified: Secondary | ICD-10-CM

## 2017-06-04 NOTE — Therapy (Signed)
[facility] [place], [place], [address] Phone: [phone]   Fax:  [phone]  Physical Therapy Treatment  Patient Details  Name: Bradley Espinoza MRN: [id number] Date of Birth: 11/28/1955 Referring Provider: Spero Geralds   Encounter Date: 06/04/2017      PT End of Session - 06/04/17 1305    Visit Number 3   Number of Visits 9   Date for PT Re-Evaluation 06/25/17   Authorization Type [employer]    Authorization Time Period 05/28/17 to 06/28/17   Authorization - Visit Number 3   Authorization - Number of Visits 10   PT Start Time 1301   PT Stop Time 1340   PT Time Calculation (min) 39 min   Activity Tolerance Patient tolerated treatment well;No increased pain   Behavior During Therapy WFL for tasks assessed/performed      Past Medical History:  Diagnosis Date  . Anxiety   . Arthritis    low back pain, lumbar radiculopathy  . Depression   . Fracture    B/L ankles  . GERD (gastroesophageal reflux disease)   . Headache(784.0)    allergy related   . History of kidney stones   . History of stomach ulcers   . Retained orthopedic hardware    failed retained hardware right foot  . Wears glasses     Past Surgical History:  Procedure Laterality Date  . ANKLE FUSION Right 05/11/2015   Procedure: Right Posterior Arthroscopic Subtalar Arthrodesis;  Surgeon: Newt Minion, MD;  Location: [facility];  Service: Orthopedics;  Laterality: Right;  . ANTERIOR LAT LUMBAR FUSION N/A 04/05/2017   Procedure: Extreme Lateral Interbody Fusion - Lumbar three-lumbar four ,exploration of fusion Posterior augmentation with globus addition Removal hardware Lumbar one-three. Lumbar four-sacral one,  Removal internal bone growth stimulator;  Surgeon: Kary Kos, MD;  Location: [facility];  Service: Neurosurgery;  Laterality: N/A;  . BACK SURGERY  2004   x 2  . CERVICAL SPINE SURGERY  2008  . ESOPHAGOGASTRODUODENOSCOPY    . HARDWARE  REMOVAL Right 10/09/2014   Procedure: Removal Deep Hardware, Irrigation and Debridement Calcaneus, Place Antibiotic Beads and Wound VAC ;  Surgeon: Newt Minion, MD;  Location: [facility];  Service: Orthopedics;  Laterality: Right;  . HARDWARE REMOVAL Right 08/12/2015   Procedure: Removal Deep Hardware Right Foot;  Surgeon: Newt Minion, MD;  Location: [facility];  Service: Orthopedics;  Laterality: Right;  . I&D EXTREMITY Right 09/15/2014   Procedure: IRRIGATION AND DEBRIDEMENT Ankle;  Surgeon: Renette Butters, MD;  Location: [facility];  Service: Orthopedics;  Laterality: Right;  . INGUINAL HERNIA REPAIR Bilateral   . LUMBAR PERCUTANEOUS PEDICLE SCREW 1 LEVEL N/A 04/05/2017   Procedure: LUMBAR PERCUTANEOUS PEDICLE SCREW LUMBAR THREE-FOUR;  Surgeon: Kary Kos, MD;  Location: [facility];  Service: Neurosurgery;  Laterality: N/A;  . ORIF CALCANEOUS FRACTURE Right 09/19/2014   Procedure: OPEN REDUCTION INTERNAL FIXATION (ORIF) CALCANEOUS FRACTURE;  Surgeon: Newt Minion, MD;  Location: [facility];  Service: Orthopedics;  Laterality: Right;  . ORIF CALCANEOUS FRACTURE Left 09/19/2014   Procedure: OPEN REDUCTION INTERNAL FIXATION (ORIF) CALCANEOUS FRACTURE;  Surgeon: Newt Minion, MD;  Location: [facility];  Service: Orthopedics;  Laterality: Left;    There were no vitals filed for this visit.      Subjective Assessment - 06/04/17 1301    Subjective Pt reports that he is doing better today. He still has trouble standing up straight. He does not  go back to his surgeon until October.    Pertinent History history of B calcaneal fratures and lumbar fractures after fall off a ladder; history of mulitple surgeries for feet and lumbar spine    Patient Stated Goals reduce pain, be able to walk straight   Currently in Pain? Other (Comment)  No pain rating given   Pain Onset More than a month ago                         [facility] Adult PT Treatment/Exercise - 06/04/17 0001      Exercises   Exercises Other Exercises    Other Exercises  supine B latissimus stretch 10x10 sec hold; Supine thomas stretch with brace donned 3x30 sec each      Lumbar Exercises: Supine   Ab Set 15 reps;5 seconds   AB Set Limitations verbal cues to improve activation and encourage breathing    Heel Slides 15 reps   Heel Slides Limitations with glute/quad set end range extension    Bent Knee Raise 3 seconds;15 reps   Bent Knee Raise Limitations with BUE pressdown x15 reps each LE    Isometric Hip Flexion 10 reps;3 seconds   Isometric Hip Flexion Limitations x10 reps each    Other Supine Lumbar Exercises supine bent knee fall out with BUE pressdown x15 reps each with 5 sec hold end range   Other Supine Lumbar Exercises Supine physioball rolling Lt/Rt 2x5 reps each direction                 PT Education - 06/04/17 1312    Education provided Yes   Education Details technique with therex; importance of incorporating proper breathing into his exercises; updated and reviewed HEP   Person(s) Educated Patient   Methods Explanation;Verbal cues   Comprehension Verbalized understanding;Returned demonstration          PT Short Term Goals - 05/28/17 1706      PT SHORT TERM GOAL #1   Title patient to demonstrate improved gait mechanics including upright posture, equal step lengths, straight trajectory, and without reaching for items in environment in order to improve mobility and community access    Time 2   Period Weeks   Status New   Target Date 06/11/17     PT SHORT TERM GOAL #2   Title patient to be able to maintain correct posture at least 75% of the time in order to demonstrate improved core strength and to improve mechanics    Time 2   Period Weeks   Status New     PT SHORT TERM GOAL #3   Title Patient to experience pain as being no more than 6/10 in order to improve QOL and functional task tolerance    Time 2   Period Weeks   Status New           PT Long Term Goals - 05/28/17 1708      PT LONG TERM  GOAL #1   Title Patient to demonstate MMT as having improved by at least 1 grade in order to improve mobility and overall gait pattern    Time 4   Period Weeks   Status New   Target Date 06/25/17     PT LONG TERM GOAL #2   Title Patient to experience pain as being no more than 4/10 in order to improve QOL and tolerance to functional tasks    Time 4   Period Weeks  Status New     PT LONG TERM GOAL #3   Title Patient to be independent in advanced HEP, to be updated PRN, in order to maintain functional gains and prevent exacdrbation of pain    Time 4   Period Weeks   Status New               Plan - 06/04/17 1319    Clinical Impression Statement Pt arrived reporting improvements overall in his pain and mobility. Session continued with therex to improve deep abdominal activation and coordination, requiring occasional verbal/tactile cues to incorporate breathing and focus on proper technique. Pt demonstrates significant tightness of B hip flexors, largely contributing to his flexed posture, so therapist added hip flexor stretch to his HEP. Ended session without report of increase in pain, and therapist encouraged continued HEP adherence.   Rehab Potential Fair   Clinical Impairments Affecting Rehab Potential (+) age, overall health status; (-) chronicity of pain, multiple surgeries, mixed outcomes of PT earlier this year, mixed motivation    PT Frequency 2x / week   PT Duration 4 weeks   PT Treatment/Interventions ADLs/Self Care Home Management;Biofeedback;Cryotherapy;Electrical Stimulation;Moist Heat;DME Instruction;Gait training;Stair training;Functional mobility training;Therapeutic activities;Therapeutic exercise;Balance training;Neuromuscular re-education;Patient/family education;Manual techniques;Energy conservation;Taping   PT Next Visit Plan continue to core/proximal strengthening ab set with D2 patterns, physioball rolling, hip flexor stretch and eccentric exercise   PT Home  Exercise Plan Eval: bridiges, brace supine marches, supine clams with red TB, TA activation, thomas position hip flexor stretch   Consulted and Agree with Plan of Care Patient      Patient will benefit from skilled therapeutic intervention in order to improve the following deficits and impairments:  Abnormal gait, Increased fascial restricitons, Improper body mechanics, Pain, Decreased coordination, Decreased mobility, Postural dysfunction, Decreased activity tolerance, Decreased strength, Decreased range of motion, Hypomobility, Decreased balance, Difficulty walking, Impaired flexibility  Visit Diagnosis: Chronic bilateral low back pain without sciatica  Abnormal posture  Muscle weakness (generalized)  Difficulty in walking, not elsewhere classified     Problem List Patient Active Problem List   Diagnosis Date Noted  . DDD (degenerative disc disease), lumbar 04/05/2017  . Fracture of L2 vertebra ([facility]) 01/12/2015  . Acute osteomyelitis of calcaneum ([facility]) 10/09/2014  . Dysuria 10/05/2014  . Cellulitis 10/05/2014  . Fall from ladder 09/21/2014  . L2 vertebral fracture ([facility]) 09/21/2014  . Bilateral calcaneal fractures 09/21/2014  . Chronic pain 09/21/2014  . Acute blood loss anemia 09/21/2014  . Open right calcaneal fracture 09/15/2014    1:44 PM,06/04/17 Elly Modena PT, DPT [facility] [address] [place], [place], [address] Phone: [phone]   Fax:  [phone]  Name: BRION SOSSAMON MRN: [id number] Date of Birth: Apr 07, 1956

## 2017-06-06 ENCOUNTER — Ambulatory Visit (HOSPITAL_COMMUNITY): Payer: PPO | Admitting: Physical Therapy

## 2017-06-06 DIAGNOSIS — M545 Low back pain: Principal | ICD-10-CM

## 2017-06-06 DIAGNOSIS — R293 Abnormal posture: Secondary | ICD-10-CM

## 2017-06-06 DIAGNOSIS — G8929 Other chronic pain: Secondary | ICD-10-CM

## 2017-06-06 DIAGNOSIS — M6281 Muscle weakness (generalized): Secondary | ICD-10-CM

## 2017-06-06 DIAGNOSIS — R262 Difficulty in walking, not elsewhere classified: Secondary | ICD-10-CM

## 2017-06-06 NOTE — Therapy (Signed)
[place], [place], [address] Phone: [phone]   Fax:  [phone]  Physical Therapy Treatment  Patient Details  Name: Bradley Espinoza MRN: [id number] Date of Birth: August 25, 1956 Referring Provider: Spero Geralds   Encounter Date: 06/06/2017      PT End of Session - 06/06/17 1103    Visit Number 4   Number of Visits 9   Date for PT Re-Evaluation 06/25/17   Authorization Type [employer]    Authorization Time Period 05/28/17 to 06/28/17   Authorization - Visit Number 4   Authorization - Number of Visits 10   PT Start Time 5102  Pt arrived early    PT Stop Time 1145   PT Time Calculation (min) 47 min   Equipment Utilized During Treatment Other (comment)  back brace removed during session   Activity Tolerance Patient tolerated treatment well;No increased pain   Behavior During Therapy WFL for tasks assessed/performed      Past Medical History:  Diagnosis Date  . Anxiety   . Arthritis    low back pain, lumbar radiculopathy  . Depression   . Fracture    B/L ankles  . GERD (gastroesophageal reflux disease)   . Headache(784.0)    allergy related   . History of kidney stones   . History of stomach ulcers   . Retained orthopedic hardware    failed retained hardware right foot  . Wears glasses     Past Surgical History:  Procedure Laterality Date  . ANKLE FUSION Right 05/11/2015   Procedure: Right Posterior Arthroscopic Subtalar Arthrodesis;  Surgeon: Newt Minion, MD;  Location: [facility];  Service: Orthopedics;  Laterality: Right;  . ANTERIOR LAT LUMBAR FUSION N/A 04/05/2017   Procedure: Extreme Lateral Interbody Fusion - Lumbar three-lumbar four ,exploration of fusion Posterior augmentation with globus addition Removal hardware Lumbar one-three. Lumbar four-sacral one,  Removal internal bone growth stimulator;  Surgeon: Kary Kos, MD;  Location: [facility];  Service: Neurosurgery;  Laterality: N/A;   . BACK SURGERY  2004   x 2  . CERVICAL SPINE SURGERY  2008  . ESOPHAGOGASTRODUODENOSCOPY    . HARDWARE REMOVAL Right 10/09/2014   Procedure: Removal Deep Hardware, Irrigation and Debridement Calcaneus, Place Antibiotic Beads and Wound VAC ;  Surgeon: Newt Minion, MD;  Location: [facility];  Service: Orthopedics;  Laterality: Right;  . HARDWARE REMOVAL Right 08/12/2015   Procedure: Removal Deep Hardware Right Foot;  Surgeon: Newt Minion, MD;  Location: [facility];  Service: Orthopedics;  Laterality: Right;  . I&D EXTREMITY Right 09/15/2014   Procedure: IRRIGATION AND DEBRIDEMENT Ankle;  Surgeon: Renette Butters, MD;  Location: [facility];  Service: Orthopedics;  Laterality: Right;  . INGUINAL HERNIA REPAIR Bilateral   . LUMBAR PERCUTANEOUS PEDICLE SCREW 1 LEVEL N/A 04/05/2017   Procedure: LUMBAR PERCUTANEOUS PEDICLE SCREW LUMBAR THREE-FOUR;  Surgeon: Kary Kos, MD;  Location: [facility];  Service: Neurosurgery;  Laterality: N/A;  . ORIF CALCANEOUS FRACTURE Right 09/19/2014   Procedure: OPEN REDUCTION INTERNAL FIXATION (ORIF) CALCANEOUS FRACTURE;  Surgeon: Newt Minion, MD;  Location: [facility];  Service: Orthopedics;  Laterality: Right;  . ORIF CALCANEOUS FRACTURE Left 09/19/2014   Procedure: OPEN REDUCTION INTERNAL FIXATION (ORIF) CALCANEOUS FRACTURE;  Surgeon: Newt Minion, MD;  Location: [facility];  Service: Orthopedics;  Laterality: Left;    There were no vitals filed for this visit.      Subjective Assessment - 06/06/17 1103  Subjective Pt reports that he was a little sore after his last session but has been trying to do things to work out some of the soreness. His Rt ankle is bothering him most right now, where he had his prior surgeries.    Pertinent History history of B calcaneal fratures and lumbar fractures after fall off a ladder; history of mulitple surgeries for feet and lumbar spine    Patient Stated Goals reduce pain, be able to walk straight   Currently in Pain? Yes   Pain Score 7    Pain  Location Hip   Pain Orientation Right;Posterior   Pain Descriptors / Indicators Aching;Dull   Pain Type Chronic pain   Pain Radiating Towards none currently    Pain Onset More than a month ago   Aggravating Factors  doing too much    Pain Relieving Factors rest    Effect of Pain on Daily Activities severe                  warm-up prior to start of session: Nustep L1 x5 min, above 70 steps/min       [facility] Adult PT Treatment/Exercise - 06/06/17 0001      Lumbar Exercises: Supine   Other Supine Lumbar Exercises supine bent knee fall out with BUE pressdown x15 reps each with 5 sec hold end range;  ab set with UE D2 pattern 2x10 reps each    Other Supine Lumbar Exercises Supine physioball rolling Lt/Rt 2x5 reps each direction cues for proper breathing Supine hip flexor eccentric lowering x10 reps in thomas position     Manual Therapy   Manual Therapy Soft tissue mobilization   Manual therapy comments separate rest of session   Soft tissue mobilization IASTM along Rt proximal quadriceps to decrease muscle spasm and tightness                 PT Education - 06/06/17 1149    Education provided Yes   Education Details technique with therex    Person(s) Educated Patient   Methods Explanation;Verbal cues   Comprehension Returned demonstration;Verbalized understanding          PT Short Term Goals - 05/28/17 1706      PT SHORT TERM GOAL #1   Title patient to demonstrate improved gait mechanics including upright posture, equal step lengths, straight trajectory, and without reaching for items in environment in order to improve mobility and community access    Time 2   Period Weeks   Status New   Target Date 06/11/17     PT SHORT TERM GOAL #2   Title patient to be able to maintain correct posture at least 75% of the time in order to demonstrate improved core strength and to improve mechanics    Time 2   Period Weeks   Status New     PT SHORT TERM GOAL #3    Title Patient to experience pain as being no more than 6/10 in order to improve QOL and functional task tolerance    Time 2   Period Weeks   Status New           PT Long Term Goals - 05/28/17 1708      PT LONG TERM GOAL #1   Title Patient to demonstate MMT as having improved by at least 1 grade in order to improve mobility and overall gait pattern    Time 4   Period Weeks   Status New   Target  Date 06/25/17     PT LONG TERM GOAL #2   Title Patient to experience pain as being no more than 4/10 in order to improve QOL and tolerance to functional tasks    Time 4   Period Weeks   Status New     PT LONG TERM GOAL #3   Title Patient to be independent in advanced HEP, to be updated PRN, in order to maintain functional gains and prevent exacdrbation of pain    Time 4   Period Weeks   Status New               Plan - 06/06/17 1150    Clinical Impression Statement Continued this session with activity to encourage lumbar ROM and improve flexibility/mobility. Pt was able to complete all exercises without report of increase in pain, noting the need for verbal cues at time to improve breathing. Ended session with soft tissue techniques to decrease quadriceps spasm. No report of increased pain and pt verbalized understanding of the possibilities of delayed muscle soreness. Will continue with current POC.   Rehab Potential Fair   Clinical Impairments Affecting Rehab Potential (+) age, overall health status; (-) chronicity of pain, multiple surgeries, mixed outcomes of PT earlier this year, mixed motivation    PT Frequency 2x / week   PT Duration 4 weeks   PT Treatment/Interventions ADLs/Self Care Home Management;Biofeedback;Cryotherapy;Electrical Stimulation;Moist Heat;DME Instruction;Gait training;Stair training;Functional mobility training;Therapeutic activities;Therapeutic exercise;Balance training;Neuromuscular re-education;Patient/family education;Manual techniques;Energy  conservation;Taping   PT Next Visit Plan continue to core/proximal strengthening in supine or sidelying positions, soft tissue techniques to address muscle tightness/spasm Rt low back/hip flexors   Consulted and Agree with Plan of Care Patient      Patient will benefit from skilled therapeutic intervention in order to improve the following deficits and impairments:  Abnormal gait, Increased fascial restricitons, Improper body mechanics, Pain, Decreased coordination, Decreased mobility, Postural dysfunction, Decreased activity tolerance, Decreased strength, Decreased range of motion, Hypomobility, Decreased balance, Difficulty walking, Impaired flexibility  Visit Diagnosis: Chronic bilateral low back pain without sciatica  Abnormal posture  Muscle weakness (generalized)  Difficulty in walking, not elsewhere classified     Problem List Patient Active Problem List   Diagnosis Date Noted  . DDD (degenerative disc disease), lumbar 04/05/2017  . Fracture of L2 vertebra ([facility]) 01/12/2015  . Acute osteomyelitis of calcaneum ([facility]) 10/09/2014  . Dysuria 10/05/2014  . Cellulitis 10/05/2014  . Fall from ladder 09/21/2014  . L2 vertebral fracture ([facility]) 09/21/2014  . Bilateral calcaneal fractures 09/21/2014  . Chronic pain 09/21/2014  . Acute blood loss anemia 09/21/2014  . Open right calcaneal fracture 09/15/2014    11:58 AM,06/06/17 Elly Modena PT, DPT [facility] [address] [place], [place], [address] Phone: [phone]   Fax:  [phone]  Name: Bradley Espinoza MRN: [id number] Date of Birth: 1955/11/15

## 2017-06-11 ENCOUNTER — Ambulatory Visit (HOSPITAL_COMMUNITY): Payer: PPO | Admitting: Physical Therapy

## 2017-06-11 ENCOUNTER — Telehealth (HOSPITAL_COMMUNITY): Payer: Self-pay | Admitting: Internal Medicine

## 2017-06-11 NOTE — Telephone Encounter (Signed)
06/11/17  he said there had been a death in his family and the funeral is today

## 2017-06-13 ENCOUNTER — Ambulatory Visit (HOSPITAL_COMMUNITY): Payer: PPO | Attending: Neurosurgery | Admitting: Physical Therapy

## 2017-06-13 DIAGNOSIS — R262 Difficulty in walking, not elsewhere classified: Secondary | ICD-10-CM | POA: Insufficient documentation

## 2017-06-13 DIAGNOSIS — G8929 Other chronic pain: Secondary | ICD-10-CM

## 2017-06-13 DIAGNOSIS — M545 Low back pain, unspecified: Secondary | ICD-10-CM

## 2017-06-13 DIAGNOSIS — R29898 Other symptoms and signs involving the musculoskeletal system: Secondary | ICD-10-CM

## 2017-06-13 DIAGNOSIS — R293 Abnormal posture: Secondary | ICD-10-CM | POA: Insufficient documentation

## 2017-06-13 DIAGNOSIS — M5442 Lumbago with sciatica, left side: Secondary | ICD-10-CM | POA: Insufficient documentation

## 2017-06-13 DIAGNOSIS — M6281 Muscle weakness (generalized): Secondary | ICD-10-CM | POA: Diagnosis not present

## 2017-06-13 NOTE — Patient Instructions (Addendum)
Backward Bend (Standing)    Arch backward to make hollow of back deeper. Hold _3___ seconds. Repeat ___5_ times per set. Do _2___ sets per session. Do ___3_ sessions per day.  http://orth.exer.us/178   Copyright  VHI. All rights reserved.  Thoracolumbar Side-Bend: Single Arm (Standing)    Reach over head to other side with right arm until stretch is felt. Hold __3__ seconds. Relax. Repeat __5__ times per set. Do __1__ sets per session. Do ____ sessions per day. 1 http://orth.exer.us/262   Copyright  VHI. All rights reserved.

## 2017-06-13 NOTE — Therapy (Signed)
[place], [place], [address] Phone: [phone]   Fax:  [phone]  Physical Therapy Treatment  Patient Details  Name: Bradley Espinoza MRN: [id number] Date of Birth: Dec 13, 1955 Referring Provider: Spero Geralds   Encounter Date: 06/13/2017      PT End of Session - 06/13/17 1335    Visit Number 5   Number of Visits 9   Date for PT Re-Evaluation 06/25/17   Authorization Type [employer]    Authorization Time Period 05/28/17 to 06/28/17   Authorization - Visit Number 5   Authorization - Number of Visits 10   PT Start Time 1300   PT Stop Time 1340   PT Time Calculation (min) 40 min   Equipment Utilized During Treatment Other (comment)  back brace removed during session   Activity Tolerance Patient tolerated treatment well;No increased pain   Behavior During Therapy WFL for tasks assessed/performed      Past Medical History:  Diagnosis Date  . Anxiety   . Arthritis    low back pain, lumbar radiculopathy  . Depression   . Fracture    B/L ankles  . GERD (gastroesophageal reflux disease)   . Headache(784.0)    allergy related   . History of kidney stones   . History of stomach ulcers   . Retained orthopedic hardware    failed retained hardware right foot  . Wears glasses     Past Surgical History:  Procedure Laterality Date  . ANKLE FUSION Right 05/11/2015   Procedure: Right Posterior Arthroscopic Subtalar Arthrodesis;  Surgeon: Newt Minion, MD;  Location: [facility];  Service: Orthopedics;  Laterality: Right;  . ANTERIOR LAT LUMBAR FUSION N/A 04/05/2017   Procedure: Extreme Lateral Interbody Fusion - Lumbar three-lumbar four ,exploration of fusion Posterior augmentation with globus addition Removal hardware Lumbar one-three. Lumbar four-sacral one,  Removal internal bone growth stimulator;  Surgeon: Kary Kos, MD;  Location: [facility];  Service: Neurosurgery;  Laterality: N/A;  . BACK SURGERY  2004    x 2  . CERVICAL SPINE SURGERY  2008  . ESOPHAGOGASTRODUODENOSCOPY    . HARDWARE REMOVAL Right 10/09/2014   Procedure: Removal Deep Hardware, Irrigation and Debridement Calcaneus, Place Antibiotic Beads and Wound VAC ;  Surgeon: Newt Minion, MD;  Location: [facility];  Service: Orthopedics;  Laterality: Right;  . HARDWARE REMOVAL Right 08/12/2015   Procedure: Removal Deep Hardware Right Foot;  Surgeon: Newt Minion, MD;  Location: [facility];  Service: Orthopedics;  Laterality: Right;  . I&D EXTREMITY Right 09/15/2014   Procedure: IRRIGATION AND DEBRIDEMENT Ankle;  Surgeon: Renette Butters, MD;  Location: [facility];  Service: Orthopedics;  Laterality: Right;  . INGUINAL HERNIA REPAIR Bilateral   . LUMBAR PERCUTANEOUS PEDICLE SCREW 1 LEVEL N/A 04/05/2017   Procedure: LUMBAR PERCUTANEOUS PEDICLE SCREW LUMBAR THREE-FOUR;  Surgeon: Kary Kos, MD;  Location: [facility];  Service: Neurosurgery;  Laterality: N/A;  . ORIF CALCANEOUS FRACTURE Right 09/19/2014   Procedure: OPEN REDUCTION INTERNAL FIXATION (ORIF) CALCANEOUS FRACTURE;  Surgeon: Newt Minion, MD;  Location: [facility];  Service: Orthopedics;  Laterality: Right;  . ORIF CALCANEOUS FRACTURE Left 09/19/2014   Procedure: OPEN REDUCTION INTERNAL FIXATION (ORIF) CALCANEOUS FRACTURE;  Surgeon: Newt Minion, MD;  Location: [facility];  Service: Orthopedics;  Laterality: Left;    There were no vitals filed for this visit.      Subjective Assessment - 06/13/17 1302    Subjective Pt states that he  has been doing his exercises at home.  He has not questions    Pertinent History history of B calcaneal fratures and lumbar fractures after fall off a ladder; history of mulitple surgeries for feet and lumbar spine    Patient Stated Goals reduce pain, be able to walk straight   Pain Score 7    Pain Location Back   Pain Orientation Lower   Pain Descriptors / Indicators Aching   Pain Type Chronic pain   Pain Onset More than a month ago   Aggravating Factors  standing     Pain Relieving Factors sitting    Effect of Pain on Daily Activities increases                          [facility] Adult PT Treatment/Exercise - 06/13/17 0001      Lumbar Exercises: Stretches   Standing Side Bend 3 reps   Standing Extension 3 reps     Lumbar Exercises: Standing   Other Standing Lumbar Exercises wall arching      Lumbar Exercises: Seated   Other Seated Lumbar Exercises scapular retraction with GTB     Lumbar Exercises: Supine   Clam 10 reps   Bent Knee Raise 10 reps;4 seconds   Bridge 10 reps;5 seconds   Other Supine Lumbar Exercises decompression exercises 1-5      Lumbar Exercises: Sidelying   Hip Abduction 15 reps                PT Education - 06/13/17 1334    Education provided Yes   Education Details new HEP including decompression sheet.    Person(s) Educated Patient   Methods Explanation;Verbal cues;Tactile cues;Handout   Comprehension Verbalized understanding;Returned demonstration          PT Short Term Goals - 06/13/17 1336      PT SHORT TERM GOAL #1   Title patient to demonstrate improved gait mechanics including upright posture, equal step lengths, straight trajectory, and without reaching for items in environment in order to improve mobility and community access    Time 2   Period Weeks   Status On-going     PT SHORT TERM GOAL #2   Title patient to be able to maintain correct posture at least 75% of the time in order to demonstrate improved core strength and to improve mechanics    Time 2   Period Weeks   Status On-going     PT SHORT TERM GOAL #3   Title Patient to experience pain as being no more than 6/10 in order to improve QOL and functional task tolerance    Time 2   Period Weeks   Status On-going           PT Long Term Goals - 06/13/17 1337      PT LONG TERM GOAL #1   Title Patient to demonstate MMT as having improved by at least 1 grade in order to improve mobility and overall gait pattern     Time 4   Period Weeks   Status On-going     PT LONG TERM GOAL #2   Title Patient to experience pain as being no more than 4/10 in order to improve QOL and tolerance to functional tasks    Time 4   Period Weeks   Status On-going     PT LONG TERM GOAL #3   Title Patient to be independent in advanced HEP, to be updated PRN,  in order to maintain functional gains and prevent exacdrbation of pain    Time 4   Period Weeks   Status On-going               Plan - 06/13/17 1335    Clinical Impression Statement Added stretches and supine decompression exercises with good form noted after verbal cuing.  Pt continues to complain about his heel pain.     Rehab Potential Fair   Clinical Impairments Affecting Rehab Potential (+) age, overall health status; (-) chronicity of pain, multiple surgeries, mixed outcomes of PT earlier this year, mixed motivation    PT Frequency 2x / week   PT Duration 4 weeks   PT Treatment/Interventions ADLs/Self Care Home Management;Biofeedback;Cryotherapy;Electrical Stimulation;Moist Heat;DME Instruction;Gait training;Stair training;Functional mobility training;Therapeutic activities;Therapeutic exercise;Balance training;Neuromuscular re-education;Patient/family education;Manual techniques;Energy conservation;Taping   PT Next Visit Plan Add t-band supine decompression exercises.    Consulted and Agree with Plan of Care Patient      Patient will benefit from skilled therapeutic intervention in order to improve the following deficits and impairments:  Abnormal gait, Increased fascial restricitons, Improper body mechanics, Pain, Decreased coordination, Decreased mobility, Postural dysfunction, Decreased activity tolerance, Decreased strength, Decreased range of motion, Hypomobility, Decreased balance, Difficulty walking, Impaired flexibility  Visit Diagnosis: Chronic bilateral low back pain without sciatica  Other symptoms and signs involving the musculoskeletal  system  Chronic bilateral low back pain with left-sided sciatica     Problem List Patient Active Problem List   Diagnosis Date Noted  . DDD (degenerative disc disease), lumbar 04/05/2017  . Fracture of L2 vertebra ([facility]) 01/12/2015  . Acute osteomyelitis of calcaneum ([facility]) 10/09/2014  . Dysuria 10/05/2014  . Cellulitis 10/05/2014  . Fall from ladder 09/21/2014  . L2 vertebral fracture ([facility]) 09/21/2014  . Bilateral calcaneal fractures 09/21/2014  . Chronic pain 09/21/2014  . Acute blood loss anemia 09/21/2014  . Open right calcaneal fracture 09/15/2014   Rayetta Humphrey, PT CLT [phone] 06/13/2017, 1:40 PM  [facility] [address] [place], [place], [address] Phone: [phone]   Fax:  [phone]  Name: Bradley Espinoza MRN: [id number] Date of Birth: December 02, 1955

## 2017-06-18 ENCOUNTER — Ambulatory Visit (HOSPITAL_COMMUNITY): Payer: PPO

## 2017-06-18 DIAGNOSIS — R293 Abnormal posture: Secondary | ICD-10-CM

## 2017-06-18 DIAGNOSIS — R29898 Other symptoms and signs involving the musculoskeletal system: Secondary | ICD-10-CM

## 2017-06-18 DIAGNOSIS — M545 Low back pain: Secondary | ICD-10-CM | POA: Diagnosis not present

## 2017-06-18 DIAGNOSIS — M5442 Lumbago with sciatica, left side: Secondary | ICD-10-CM

## 2017-06-18 DIAGNOSIS — G8929 Other chronic pain: Secondary | ICD-10-CM

## 2017-06-18 NOTE — Therapy (Signed)
[facility] [address] [place], [place], [address] Phone: [phone]   Fax:  [phone]  Physical Therapy Treatment  Patient Details  Name: Bradley Espinoza MRN: [id number] Date of Birth: 21-May-1956 Referring Provider: Spero Geralds   Encounter Date: 06/18/2017      PT End of Session - 06/18/17 1100    Visit Number 6   Number of Visits 9   Date for PT Re-Evaluation 06/25/17   Authorization Type [employer]    Authorization Time Period 05/28/17 to 06/28/17   Authorization - Visit Number 6   Authorization - Number of Visits 10   PT Start Time 3614   PT Stop Time 1113   PT Time Calculation (min) 40 min   Activity Tolerance Patient tolerated treatment well;No increased pain   Behavior During Therapy WFL for tasks assessed/performed      Past Medical History:  Diagnosis Date  . Anxiety   . Arthritis    low back pain, lumbar radiculopathy  . Depression   . Fracture    B/L ankles  . GERD (gastroesophageal reflux disease)   . Headache(784.0)    allergy related   . History of kidney stones   . History of stomach ulcers   . Retained orthopedic hardware    failed retained hardware right foot  . Wears glasses     Past Surgical History:  Procedure Laterality Date  . ANKLE FUSION Right 05/11/2015   Procedure: Right Posterior Arthroscopic Subtalar Arthrodesis;  Surgeon: Newt Minion, MD;  Location: [facility];  Service: Orthopedics;  Laterality: Right;  . ANTERIOR LAT LUMBAR FUSION N/A 04/05/2017   Procedure: Extreme Lateral Interbody Fusion - Lumbar three-lumbar four ,exploration of fusion Posterior augmentation with globus addition Removal hardware Lumbar one-three. Lumbar four-sacral one,  Removal internal bone growth stimulator;  Surgeon: Kary Kos, MD;  Location: [facility];  Service: Neurosurgery;  Laterality: N/A;  . BACK SURGERY  2004   x 2  . CERVICAL SPINE SURGERY  2008  . ESOPHAGOGASTRODUODENOSCOPY    . HARDWARE  REMOVAL Right 10/09/2014   Procedure: Removal Deep Hardware, Irrigation and Debridement Calcaneus, Place Antibiotic Beads and Wound VAC ;  Surgeon: Newt Minion, MD;  Location: [facility];  Service: Orthopedics;  Laterality: Right;  . HARDWARE REMOVAL Right 08/12/2015   Procedure: Removal Deep Hardware Right Foot;  Surgeon: Newt Minion, MD;  Location: [facility];  Service: Orthopedics;  Laterality: Right;  . I&D EXTREMITY Right 09/15/2014   Procedure: IRRIGATION AND DEBRIDEMENT Ankle;  Surgeon: Renette Butters, MD;  Location: [facility];  Service: Orthopedics;  Laterality: Right;  . INGUINAL HERNIA REPAIR Bilateral   . LUMBAR PERCUTANEOUS PEDICLE SCREW 1 LEVEL N/A 04/05/2017   Procedure: LUMBAR PERCUTANEOUS PEDICLE SCREW LUMBAR THREE-FOUR;  Surgeon: Kary Kos, MD;  Location: [facility];  Service: Neurosurgery;  Laterality: N/A;  . ORIF CALCANEOUS FRACTURE Right 09/19/2014   Procedure: OPEN REDUCTION INTERNAL FIXATION (ORIF) CALCANEOUS FRACTURE;  Surgeon: Newt Minion, MD;  Location: [facility];  Service: Orthopedics;  Laterality: Right;  . ORIF CALCANEOUS FRACTURE Left 09/19/2014   Procedure: OPEN REDUCTION INTERNAL FIXATION (ORIF) CALCANEOUS FRACTURE;  Surgeon: Newt Minion, MD;  Location: [facility];  Service: Orthopedics;  Laterality: Left;    There were no vitals filed for this visit.      Subjective Assessment - 06/18/17 1039    Subjective Pt reports he thinks his basic mobility i sgetting easier. His pain is somewhat better in the back,  but his feet still hurt from old fractures. He feels h eis makng porgress on most but not all days.    Pertinent History history of B calcaneal fratures and lumbar fractures after fall off a ladder; history of mulitple surgeries for feet and lumbar spine    Currently in Pain? Yes   Pain Score 6    Pain Location --  right posterolateral glutes/hip                         [facility] Adult PT Treatment/Exercise - 06/18/17 0001      Lumbar Exercises: Stretches    Single Knee to Chest Stretch 2 reps;30 seconds     Lumbar Exercises: Standing   Other Standing Lumbar Exercises side stepping: 2x51ft bilat     Lumbar Exercises: Supine   Heel Slides --  2 minutes each side, c contrlateral flexion   Heel Slides Limitations with glute/quad set end range extension    Bent Knee Raise 10 reps  2x10 bilat   Bridge 10 reps  2x10, wide stance, end range knee fleixon     Manual Therapy   Manual Therapy Myofascial release   Myofascial Release 15 minutes total, Rt glute medius, bilat QL   good response and >50% decrease in pain.                   PT Short Term Goals - 06/13/17 1336      PT SHORT TERM GOAL #1   Title patient to demonstrate improved gait mechanics including upright posture, equal step lengths, straight trajectory, and without reaching for items in environment in order to improve mobility and community access    Time 2   Period Weeks   Status On-going     PT SHORT TERM GOAL #2   Title patient to be able to maintain correct posture at least 75% of the time in order to demonstrate improved core strength and to improve mechanics    Time 2   Period Weeks   Status On-going     PT SHORT TERM GOAL #3   Title Patient to experience pain as being no more than 6/10 in order to improve QOL and functional task tolerance    Time 2   Period Weeks   Status On-going           PT Long Term Goals - 06/13/17 1337      PT LONG TERM GOAL #1   Title Patient to demonstate MMT as having improved by at least 1 grade in order to improve mobility and overall gait pattern    Time 4   Period Weeks   Status On-going     PT LONG TERM GOAL #2   Title Patient to experience pain as being no more than 4/10 in order to improve QOL and tolerance to functional tasks    Time 4   Period Weeks   Status On-going     PT LONG TERM GOAL #3   Title Patient to be independent in advanced HEP, to be updated PRN, in order to maintain functional gains and  prevent exacdrbation of pain    Time 4   Period Weeks   Status On-going               Plan - 06/18/17 1100    Clinical Impression Statement MFR to address myofascial pain in Rt gluteals, and bilat QL. Core stabilization, and hip strengthening after. Pt with reduced pain,  and reduced kinesiophobia. Pt making good progress toward goals overall.    Rehab Potential Good   Clinical Impairments Affecting Rehab Potential (+) age, overall health status; (-) chronicity of pain, multiple surgeries, mixed outcomes of PT earlier this year, mixed motivation    PT Frequency 2x / week   PT Duration 4 weeks   PT Treatment/Interventions ADLs/Self Care Home Management;Biofeedback;Cryotherapy;Electrical Stimulation;Moist Heat;DME Instruction;Gait training;Stair training;Functional mobility training;Therapeutic activities;Therapeutic exercise;Balance training;Neuromuscular re-education;Patient/family education;Manual techniques;Energy conservation;Taping   PT Next Visit Plan Continue with MFR to address glute spasm and pain, glut emed strength, hip strenght and stabilization   PT Home Exercise Plan Eval: bridiges, brace supine marches, supine clams with red TB, TA activation, thomas position hip flexor stretch   Consulted and Agree with Plan of Care Patient      Patient will benefit from skilled therapeutic intervention in order to improve the following deficits and impairments:  Abnormal gait, Increased fascial restricitons, Improper body mechanics, Pain, Decreased coordination, Decreased mobility, Postural dysfunction, Decreased activity tolerance, Decreased strength, Decreased range of motion, Hypomobility, Decreased balance, Difficulty walking, Impaired flexibility  Visit Diagnosis: Chronic bilateral low back pain without sciatica  Other symptoms and signs involving the musculoskeletal system  Chronic bilateral low back pain with left-sided sciatica  Abnormal posture     Problem  List Patient Active Problem List   Diagnosis Date Noted  . DDD (degenerative disc disease), lumbar 04/05/2017  . Fracture of L2 vertebra ([facility]) 01/12/2015  . Acute osteomyelitis of calcaneum ([facility]) 10/09/2014  . Dysuria 10/05/2014  . Cellulitis 10/05/2014  . Fall from ladder 09/21/2014  . L2 vertebral fracture ([facility]) 09/21/2014  . Bilateral calcaneal fractures 09/21/2014  . Chronic pain 09/21/2014  . Acute blood loss anemia 09/21/2014  . Open right calcaneal fracture 09/15/2014   11:03 AM, 06/18/17 Etta Grandchild, PT, DPT Physical Therapist at [facility] [phone] (office)      Etta Grandchild 06/18/2017, 11:03 AM  [facility] [place], [place], [address] Phone: [phone]   Fax:  [phone]  Name: Bradley Espinoza MRN: [id number] Date of Birth: 1956-01-05

## 2017-06-20 ENCOUNTER — Encounter (HOSPITAL_COMMUNITY): Payer: Self-pay | Admitting: Physical Therapy

## 2017-06-20 ENCOUNTER — Ambulatory Visit (HOSPITAL_COMMUNITY): Payer: PPO | Admitting: Physical Therapy

## 2017-06-20 DIAGNOSIS — R262 Difficulty in walking, not elsewhere classified: Secondary | ICD-10-CM

## 2017-06-20 DIAGNOSIS — M545 Low back pain, unspecified: Secondary | ICD-10-CM

## 2017-06-20 DIAGNOSIS — M6281 Muscle weakness (generalized): Secondary | ICD-10-CM

## 2017-06-20 DIAGNOSIS — R29898 Other symptoms and signs involving the musculoskeletal system: Secondary | ICD-10-CM

## 2017-06-20 DIAGNOSIS — G8929 Other chronic pain: Secondary | ICD-10-CM

## 2017-06-20 DIAGNOSIS — R293 Abnormal posture: Secondary | ICD-10-CM

## 2017-06-20 NOTE — Therapy (Signed)
[facility] [place], [place], [address] Phone: [phone]   Fax:  [phone]  Physical Therapy Treatment (Re-Assessment)  Patient Details  Name: Bradley Espinoza MRN: [id number] Date of Birth: November 20, 1955 Referring Provider: Spero Geralds   Encounter Date: 06/20/2017      PT End of Session - 06/20/17 1119    Visit Number 7   Number of Visits 11   Date for PT Re-Evaluation 07/09/17   Authorization Type [employer] (G-codes done 7th session)   Authorization Time Period 4/48/18 to 5/63/14; recert done 9/70   Authorization - Visit Number 7   Authorization - Number of Visits 17   PT Start Time 1030   PT Stop Time 1110   PT Time Calculation (min) 40 min   Activity Tolerance Patient tolerated treatment well   Behavior During Therapy [facility] for tasks assessed/performed      Past Medical History:  Diagnosis Date  . Anxiety   . Arthritis    low back pain, lumbar radiculopathy  . Depression   . Fracture    B/L ankles  . GERD (gastroesophageal reflux disease)   . Headache(784.0)    allergy related   . History of kidney stones   . History of stomach ulcers   . Retained orthopedic hardware    failed retained hardware right foot  . Wears glasses     Past Surgical History:  Procedure Laterality Date  . ANKLE FUSION Right 05/11/2015   Procedure: Right Posterior Arthroscopic Subtalar Arthrodesis;  Surgeon: Newt Minion, MD;  Location: [facility];  Service: Orthopedics;  Laterality: Right;  . ANTERIOR LAT LUMBAR FUSION N/A 04/05/2017   Procedure: Extreme Lateral Interbody Fusion - Lumbar three-lumbar four ,exploration of fusion Posterior augmentation with globus addition Removal hardware Lumbar one-three. Lumbar four-sacral one,  Removal internal bone growth stimulator;  Surgeon: Kary Kos, MD;  Location: [facility];  Service: Neurosurgery;  Laterality: N/A;  . BACK SURGERY  2004   x 2  . CERVICAL SPINE SURGERY  2008  .  ESOPHAGOGASTRODUODENOSCOPY    . HARDWARE REMOVAL Right 10/09/2014   Procedure: Removal Deep Hardware, Irrigation and Debridement Calcaneus, Place Antibiotic Beads and Wound VAC ;  Surgeon: Newt Minion, MD;  Location: [facility];  Service: Orthopedics;  Laterality: Right;  . HARDWARE REMOVAL Right 08/12/2015   Procedure: Removal Deep Hardware Right Foot;  Surgeon: Newt Minion, MD;  Location: [facility];  Service: Orthopedics;  Laterality: Right;  . I&D EXTREMITY Right 09/15/2014   Procedure: IRRIGATION AND DEBRIDEMENT Ankle;  Surgeon: Renette Butters, MD;  Location: [facility];  Service: Orthopedics;  Laterality: Right;  . INGUINAL HERNIA REPAIR Bilateral   . LUMBAR PERCUTANEOUS PEDICLE SCREW 1 LEVEL N/A 04/05/2017   Procedure: LUMBAR PERCUTANEOUS PEDICLE SCREW LUMBAR THREE-FOUR;  Surgeon: Kary Kos, MD;  Location: [facility];  Service: Neurosurgery;  Laterality: N/A;  . ORIF CALCANEOUS FRACTURE Right 09/19/2014   Procedure: OPEN REDUCTION INTERNAL FIXATION (ORIF) CALCANEOUS FRACTURE;  Surgeon: Newt Minion, MD;  Location: [facility];  Service: Orthopedics;  Laterality: Right;  . ORIF CALCANEOUS FRACTURE Left 09/19/2014   Procedure: OPEN REDUCTION INTERNAL FIXATION (ORIF) CALCANEOUS FRACTURE;  Surgeon: Newt Minion, MD;  Location: [facility];  Service: Orthopedics;  Laterality: Left;    There were no vitals filed for this visit.      Subjective Assessment - 06/20/17 1031    Subjective Patient arrives stating he is sore today, he feels much more limited by  his foot than his back. If he does not do a whole lot his back pain is pretty good, he feels his pain is better than it was overall; it mostly hurts in the mornings, laying down to sleep can also be a pain. No falls or close calls since last time. He sees his Psychologist, sport and exercise in October.    Pertinent History history of B calcaneal fratures and lumbar fractures after fall off a ladder; history of mulitple surgeries for feet and lumbar spine    How long can you sit comfortably?  9/13- unlimited    How long can you stand comfortably? 9/13- 30-45 minutes    How long can you walk comfortably? 9/13- walks to do what he has to do, then rests, then walks some more. Keeps a stool close by.    Patient Stated Goals reduce pain, be able to walk straight   Currently in Pain? Yes   Pain Score 7    Pain Location Back   Pain Orientation Lower   Pain Descriptors / Indicators Pressure;Stabbing   Pain Type Chronic pain   Pain Radiating Towards little on the right leg; goes to about the knee    Pain Onset More than a month ago   Pain Frequency Intermittent   Aggravating Factors  standing    Pain Relieving Factors sitting    Effect of Pain on Daily Activities moderate to severe             [facility] PT Assessment - 06/20/17 0001      Assessment   Medical Diagnosis lumbar radiculopathy, gait training    Referring Provider Spero Geralds    Onset Date/Surgical Date --  chronic    Next MD Visit Spero Geralds in October    Prior Therapy PT here in 2016     Precautions   Precautions Back   Precaution Comments per surgeon no bending/lifting/twisting, should be in back brace      Balance Screen   Has the patient fallen in the past 6 months No   Has the patient had a decrease in activity level because of a fear of falling?  Yes   Is the patient reluctant to leave their home because of a fear of falling?  No     Prior Function   Level of Independence Independent;Independent with basic ADLs;Independent with gait;Independent with transfers   Vocation On disability     Strength   Right Hip Flexion 4+/5   Right Hip ABduction 4/5   Left Hip Flexion 4/5   Left Hip ABduction 4+/5   Right Knee Flexion 4+/5   Right Knee Extension 5/5   Left Knee Flexion 4/5   Left Knee Extension 5/5     Flexibility   Hamstrings WFL R, mild limitation L    Piriformis WFL R, mild limitation L      Ambulation/Gait   Gait Comments flexed at hips, antalgic pattern favoring R foot      High  Level Balance   High Level Balance Comments TUG 8.75 no device       Manual (performed separately from all other skilled services): ball assisted STM to R glutes, R Quadratus Lumborum, and R lumbar paraspinals.                        PT Education - 06/20/17 1118    Education provided Yes   Education Details exam findings, POC moving forward, progress towards goals; possible impact of R foot  pain on quality of movement and tolerance to movement, as well as recommendation to speak to PCP regarding foot pain including possible PT referral for this problem   Person(s) Educated Patient   Methods Explanation   Comprehension Verbalized understanding          PT Short Term Goals - 06/20/17 1048      PT SHORT TERM GOAL #1   Title patient to demonstrate improved gait mechanics including upright posture, equal step lengths, straight trajectory, and without reaching for items in environment in order to improve mobility and community access    Baseline 9/13- limited by foot rather than back, back is much improved with gait    Time 2   Period Weeks   Status On-going     PT SHORT TERM GOAL #2   Title patient to be able to maintain correct posture at least 75% of the time in order to demonstrate improved core strength and to improve mechanics    Baseline 9/13- improved    Time 2   Period Weeks   Status Partially Met     PT SHORT TERM GOAL #3   Title Patient to experience pain as being no more than 6/10 in order to improve QOL and functional task tolerance    Baseline 9/13- remains around 7/10 today; at best, without the foot his back just a 5/10 however    Time 2   Period Weeks   Status Partially Met     PT SHORT TERM GOAL #4   Title Pt will report being able to sleep through the night with 1 to no awakenings due to LBP to maximize recovery and improve overall function.   Baseline 9/13- sleeps all night    Time 3   Period Weeks   Status Achieved           PT  Long Term Goals - 06/20/17 1052      PT LONG TERM GOAL #1   Title Patient to demonstate MMT as having improved by at least 1 grade in order to improve mobility and overall gait pattern    Baseline 9/13- remains impaired    Time 4   Period Weeks   Status On-going     PT LONG TERM GOAL #2   Title Patient to experience pain as being no more than 4/10 in order to improve QOL and tolerance to functional tasks    Baseline 9/13- 5/10   Time 4   Period Weeks   Status On-going     PT LONG TERM GOAL #3   Title Patient to be independent in advanced HEP, to be updated PRN, in order to maintain functional gains and prevent exacdrbation of pain    Baseline 9/13- complaint with HEP    Time 4   Period Weeks   Status Achieved               Plan - 06/20/17 1120    Clinical Impression Statement Re-assessment performed today. Patient arrives in good spirits, but frustrated regarding the pain he continues to have in his foot and its resulting limitations on his ability to participate in ADLs and exercises here; his back is feeling better as a whole however. Exam reveals significant improvement in kinesiophobia as well as improved functional mobility, mechanics, and to some extent posture. At this point do suspect that his R foot pain is contributing significantly towards quality and tolerance of gait as well as [facility] based activities at this point. Recommended that  patient speak to his PCP regarding foot pain and next steps moving forward including possible PT referral for foot pain moving forward. Patient is doing well and making reasonable progress towards his goals, recommend short extension of skilled PT services moving forward.    Rehab Potential Good   Clinical Impairments Affecting Rehab Potential (+) age, overall health status; (-) chronicity of pain, multiple surgeries, mixed outcomes of PT earlier this year, mixed motivation    PT Frequency 2x / week   PT Duration 2 weeks   PT  Treatment/Interventions ADLs/Self Care Home Management;Biofeedback;Cryotherapy;Electrical Stimulation;Moist Heat;DME Instruction;Gait training;Stair training;Functional mobility training;Therapeutic activities;Therapeutic exercise;Balance training;Neuromuscular re-education;Patient/family education;Manual techniques;Energy conservation;Taping   PT Next Visit Plan continue with MFR to glutes and QL; glute med strength, hip strength and stability. F/U about possiblity of PT for foot. Update HEP    PT Home Exercise Plan Eval: bridiges, brace supine marches, supine clams with red TB, TA activation, thomas position hip flexor stretch   Consulted and Agree with Plan of Care Patient      Patient will benefit from skilled therapeutic intervention in order to improve the following deficits and impairments:  Abnormal gait, Increased fascial restricitons, Improper body mechanics, Pain, Decreased coordination, Decreased mobility, Postural dysfunction, Decreased activity tolerance, Decreased strength, Decreased range of motion, Hypomobility, Decreased balance, Difficulty walking, Impaired flexibility  Visit Diagnosis: Chronic bilateral low back pain without sciatica - Plan: PT plan of care cert/re-cert  Other symptoms and signs involving the musculoskeletal system - Plan: PT plan of care cert/re-cert  Abnormal posture - Plan: PT plan of care cert/re-cert  Muscle weakness (generalized) - Plan: PT plan of care cert/re-cert  Difficulty in walking, not elsewhere classified - Plan: PT plan of care cert/re-cert       G-Codes - Jun 22, 2017 1122    Functional Assessment Tool Used (Outpatient Only) Based on skilled clinical assessment of posture, strength, gait, fall risk, pain patterns   Functional Limitation Mobility: Walking and moving around   Mobility: Walking and Moving Around Current Status (A2505) At least 20 percent but less than 40 percent impaired, limited or restricted   Mobility: Walking and Moving  Around Goal Status [phone]) At least 20 percent but less than 40 percent impaired, limited or restricted      Problem List Patient Active Problem List   Diagnosis Date Noted  . DDD (degenerative disc disease), lumbar 04/05/2017  . Fracture of L2 vertebra ([place]) 01/12/2015  . Acute osteomyelitis of calcaneum ([place]) 10/09/2014  . Dysuria 10/05/2014  . Cellulitis 10/05/2014  . Fall from ladder 09/21/2014  . L2 vertebral fracture ([facility]) 09/21/2014  . Bilateral calcaneal fractures 09/21/2014  . Chronic pain 09/21/2014  . Acute blood loss anemia 09/21/2014  . Open right calcaneal fracture 09/15/2014    Deniece Ree PT, DPT [facility] [address] [place], [place], [address] Phone: [phone]   Fax:  [phone]  Name: Bradley Espinoza MRN: [id number] Date of Birth: December 12, 1955

## 2017-06-25 ENCOUNTER — Encounter (HOSPITAL_COMMUNITY): Payer: Self-pay

## 2017-06-25 ENCOUNTER — Ambulatory Visit (HOSPITAL_COMMUNITY): Payer: PPO | Admitting: Physical Therapy

## 2017-06-25 DIAGNOSIS — M545 Low back pain, unspecified: Secondary | ICD-10-CM

## 2017-06-25 DIAGNOSIS — R293 Abnormal posture: Secondary | ICD-10-CM

## 2017-06-25 DIAGNOSIS — G8929 Other chronic pain: Secondary | ICD-10-CM

## 2017-06-25 DIAGNOSIS — M6281 Muscle weakness (generalized): Secondary | ICD-10-CM

## 2017-06-25 DIAGNOSIS — R29898 Other symptoms and signs involving the musculoskeletal system: Secondary | ICD-10-CM

## 2017-06-25 DIAGNOSIS — R262 Difficulty in walking, not elsewhere classified: Secondary | ICD-10-CM

## 2017-06-25 DIAGNOSIS — M5442 Lumbago with sciatica, left side: Secondary | ICD-10-CM

## 2017-06-25 NOTE — Therapy (Signed)
[place], [place], [address] Phone: [phone]   Fax:  [phone]  Physical Therapy Treatment  Patient Details  Name: Bradley Espinoza MRN: [id number] Date of Birth: 1956-01-26 Referring Provider: Spero Geralds   Encounter Date: 06/25/2017      PT End of Session - 06/25/17 1813    Visit Number 8   Number of Visits 11   Date for PT Re-Evaluation 07/09/17   Authorization Type [employer] (G-codes done 7th session)   Authorization Time Period 06/05/99 to 3/49/17; recert done 9/15   Authorization - Visit Number 8   Authorization - Number of Visits 17   PT Start Time 0569   PT Stop Time 1814   PT Time Calculation (min) 41 min   Equipment Utilized During Treatment Other (comment)   Activity Tolerance Patient tolerated treatment well   Behavior During Therapy [facility] for tasks assessed/performed      Past Medical History:  Diagnosis Date  . Anxiety   . Arthritis    low back pain, lumbar radiculopathy  . Depression   . Fracture    B/L ankles  . GERD (gastroesophageal reflux disease)   . Headache(784.0)    allergy related   . History of kidney stones   . History of stomach ulcers   . Retained orthopedic hardware    failed retained hardware right foot  . Wears glasses     Past Surgical History:  Procedure Laterality Date  . ANKLE FUSION Right 05/11/2015   Procedure: Right Posterior Arthroscopic Subtalar Arthrodesis;  Surgeon: Newt Minion, MD;  Location: [facility];  Service: Orthopedics;  Laterality: Right;  . ANTERIOR LAT LUMBAR FUSION N/A 04/05/2017   Procedure: Extreme Lateral Interbody Fusion - Lumbar three-lumbar four ,exploration of fusion Posterior augmentation with globus addition Removal hardware Lumbar one-three. Lumbar four-sacral one,  Removal internal bone growth stimulator;  Surgeon: Kary Kos, MD;  Location: [facility];  Service: Neurosurgery;  Laterality: N/A;  . BACK SURGERY  2004   x 2   . CERVICAL SPINE SURGERY  2008  . ESOPHAGOGASTRODUODENOSCOPY    . HARDWARE REMOVAL Right 10/09/2014   Procedure: Removal Deep Hardware, Irrigation and Debridement Calcaneus, Place Antibiotic Beads and Wound VAC ;  Surgeon: Newt Minion, MD;  Location: [facility];  Service: Orthopedics;  Laterality: Right;  . HARDWARE REMOVAL Right 08/12/2015   Procedure: Removal Deep Hardware Right Foot;  Surgeon: Newt Minion, MD;  Location: [facility];  Service: Orthopedics;  Laterality: Right;  . I&D EXTREMITY Right 09/15/2014   Procedure: IRRIGATION AND DEBRIDEMENT Ankle;  Surgeon: Renette Butters, MD;  Location: [facility];  Service: Orthopedics;  Laterality: Right;  . INGUINAL HERNIA REPAIR Bilateral   . LUMBAR PERCUTANEOUS PEDICLE SCREW 1 LEVEL N/A 04/05/2017   Procedure: LUMBAR PERCUTANEOUS PEDICLE SCREW LUMBAR THREE-FOUR;  Surgeon: Kary Kos, MD;  Location: [facility];  Service: Neurosurgery;  Laterality: N/A;  . ORIF CALCANEOUS FRACTURE Right 09/19/2014   Procedure: OPEN REDUCTION INTERNAL FIXATION (ORIF) CALCANEOUS FRACTURE;  Surgeon: Newt Minion, MD;  Location: [facility];  Service: Orthopedics;  Laterality: Right;  . ORIF CALCANEOUS FRACTURE Left 09/19/2014   Procedure: OPEN REDUCTION INTERNAL FIXATION (ORIF) CALCANEOUS FRACTURE;  Surgeon: Newt Minion, MD;  Location: [facility];  Service: Orthopedics;  Laterality: Left;    There were no vitals filed for this visit.      Subjective Assessment - 06/25/17 1737    Subjective patient arrives reporting he has been  trying to get in contact with his MD regarding PT for his foot  but has not been successful yet    Pertinent History history of B calcaneal fratures and lumbar fractures after fall off a ladder; history of mulitple surgeries for feet and lumbar spine    Patient Stated Goals reduce pain, be able to walk straight   Currently in Pain? Yes  R foot is 10/10 today   Pain Score 6    Pain Location Back   Pain Orientation Lower                          [facility] Adult PT Treatment/Exercise - 06/25/17 0001      Lumbar Exercises: Seated   Other Seated Lumbar Exercises dyna disc: TA contraction x 10, A/P pelvic tilt x 10, side to side x 10; marching x 10 ea LE, seated dead bug x 5 each side, trunk flexion for extensor strength x 5; trunk lateral bend x 5 each side     Lumbar Exercises: Supine   Bridge 15 reps   Straight Leg Raise 15 reps     Lumbar Exercises: Sidelying   Hip Abduction Limitations 10 reps; 2 sets     Lumbar Exercises: Prone   Straight Leg Raise 10 reps   Straight Leg Raises Limitations Each leg, alternate  Patient notes getting easier with each repetition                  PT Short Term Goals - 06/20/17 1048      PT SHORT TERM GOAL #1   Title patient to demonstrate improved gait mechanics including upright posture, equal step lengths, straight trajectory, and without reaching for items in environment in order to improve mobility and community access    Baseline 9/13- limited by foot rather than back, back is much improved with gait    Time 2   Period Weeks   Status On-going     PT SHORT TERM GOAL #2   Title patient to be able to maintain correct posture at least 75% of the time in order to demonstrate improved core strength and to improve mechanics    Baseline 9/13- improved    Time 2   Period Weeks   Status Partially Met     PT SHORT TERM GOAL #3   Title Patient to experience pain as being no more than 6/10 in order to improve QOL and functional task tolerance    Baseline 9/13- remains around 7/10 today; at best, without the foot his back just a 5/10 however    Time 2   Period Weeks   Status Partially Met     PT SHORT TERM GOAL #4   Title Pt will report being able to sleep through the night with 1 to no awakenings due to LBP to maximize recovery and improve overall function.   Baseline 9/13- sleeps all night    Time 3   Period Weeks   Status Achieved           PT Long Term Goals - 06/20/17  1052      PT LONG TERM GOAL #1   Title Patient to demonstate MMT as having improved by at least 1 grade in order to improve mobility and overall gait pattern    Baseline 9/13- remains impaired    Time 4   Period Weeks   Status On-going     PT LONG TERM GOAL #2  Title Patient to experience pain as being no more than 4/10 in order to improve QOL and tolerance to functional tasks    Baseline 9/13- 5/10   Time 4   Period Weeks   Status On-going     PT LONG TERM GOAL #3   Title Patient to be independent in advanced HEP, to be updated PRN, in order to maintain functional gains and prevent exacdrbation of pain    Baseline 9/13- complaint with HEP    Time 4   Period Weeks   Status Achieved               Plan - 06/25/17 1814    Clinical Impression Statement Patient notes that his back is feeling pretty good today, however, his foot is significantly bothering him today with reports of constant "burning". Pt able to tolerate added prone hip extension exercise and seated core on unsatble surface well with no reports of increased pain. Patient presents with guarded and slow motions throughout therex. End of session patient reports back feeling pretty good after light active motion. Filled out form to fax to referring MD about gettin foot evaluation.    PT Treatment/Interventions ADLs/Self Care Home Management;Biofeedback;Cryotherapy;Electrical Stimulation;Moist Heat;DME Instruction;Gait training;Stair training;Functional mobility training;Therapeutic activities;Therapeutic exercise;Balance training;Neuromuscular re-education;Patient/family education;Manual techniques;Energy conservation;Taping   PT Next Visit Plan continue with MFR to glutes and QL; glute med strength, hip strength and stability. F/U about possiblity of PT for foot. Update HEP    PT Home Exercise Plan Eval: bridiges, brace supine marches, supine clams with red TB, TA activation, thomas position hip flexor stretch   Consulted  and Agree with Plan of Care Patient      Patient will benefit from skilled therapeutic intervention in order to improve the following deficits and impairments:  Abnormal gait, Increased fascial restricitons, Improper body mechanics, Pain, Decreased coordination, Decreased mobility, Postural dysfunction, Decreased activity tolerance, Decreased strength, Decreased range of motion, Hypomobility, Decreased balance, Difficulty walking, Impaired flexibility  Visit Diagnosis: Chronic bilateral low back pain without sciatica  Other symptoms and signs involving the musculoskeletal system  Abnormal posture  Muscle weakness (generalized)  Difficulty in walking, not elsewhere classified  Chronic bilateral low back pain with left-sided sciatica     Problem List Patient Active Problem List   Diagnosis Date Noted  . DDD (degenerative disc disease), lumbar 04/05/2017  . Fracture of L2 vertebra ([place]) 01/12/2015  . Acute osteomyelitis of calcaneum ([place]) 10/09/2014  . Dysuria 10/05/2014  . Cellulitis 10/05/2014  . Fall from ladder 09/21/2014  . L2 vertebral fracture ([facility]) 09/21/2014  . Bilateral calcaneal fractures 09/21/2014  . Chronic pain 09/21/2014  . Acute blood loss anemia 09/21/2014  . Open right calcaneal fracture 09/15/2014    Deniece Ree PT, DPT [facility] [address] [place], [place], [address] Phone: [phone]   Fax:  [phone]  Name: Bradley Espinoza MRN: [id number] Date of Birth: [birth year]

## 2017-06-25 NOTE — Patient Instructions (Signed)
   HIP ABDUCTION - SIDELYING While lying on your side, slowly raise up your top leg to the side. Keep your knee straight and maintain your toes pointed forward the entire time. Keep your leg in-line with your body. The bottom leg can be bent to stabilize your body.  10 repetitions, 2 sets

## 2017-07-02 ENCOUNTER — Ambulatory Visit (HOSPITAL_COMMUNITY): Payer: PPO

## 2017-07-02 DIAGNOSIS — M545 Low back pain, unspecified: Secondary | ICD-10-CM

## 2017-07-02 DIAGNOSIS — R293 Abnormal posture: Secondary | ICD-10-CM

## 2017-07-02 DIAGNOSIS — G8929 Other chronic pain: Secondary | ICD-10-CM

## 2017-07-02 DIAGNOSIS — R262 Difficulty in walking, not elsewhere classified: Secondary | ICD-10-CM

## 2017-07-02 DIAGNOSIS — M5442 Lumbago with sciatica, left side: Secondary | ICD-10-CM

## 2017-07-02 DIAGNOSIS — M6281 Muscle weakness (generalized): Secondary | ICD-10-CM

## 2017-07-02 DIAGNOSIS — R29898 Other symptoms and signs involving the musculoskeletal system: Secondary | ICD-10-CM

## 2017-07-02 NOTE — Therapy (Signed)
[place], [place], [address] Phone: [phone]   Fax:  [phone]  Physical Therapy Treatment  Patient Details  Name: Bradley Espinoza MRN: [id number] Date of Birth: 1955/10/30 Referring Provider: Spero Geralds   Encounter Date: 07/02/2017      PT End of Session - 07/02/17 1356    Visit Number 9   Number of Visits 11   Authorization Type [employer] (G-codes done 7th session)   Authorization Time Period 06/28/18 to 01/23/39; recert done 8/14   Authorization - Visit Number 9   Authorization - Number of Visits 17   PT Start Time 1350   PT Stop Time 1434   PT Time Calculation (min) 44 min   Activity Tolerance Patient tolerated treatment well;Patient limited by pain;No increased pain   Behavior During Therapy [facility] for tasks assessed/performed      Past Medical History:  Diagnosis Date  . Anxiety   . Arthritis    low back pain, lumbar radiculopathy  . Depression   . Fracture    B/L ankles  . GERD (gastroesophageal reflux disease)   . Headache(784.0)    allergy related   . History of kidney stones   . History of stomach ulcers   . Retained orthopedic hardware    failed retained hardware right foot  . Wears glasses     Past Surgical History:  Procedure Laterality Date  . ANKLE FUSION Right 05/11/2015   Procedure: Right Posterior Arthroscopic Subtalar Arthrodesis;  Surgeon: Newt Minion, MD;  Location: [facility];  Service: Orthopedics;  Laterality: Right;  . ANTERIOR LAT LUMBAR FUSION N/A 04/05/2017   Procedure: Extreme Lateral Interbody Fusion - Lumbar three-lumbar four ,exploration of fusion Posterior augmentation with globus addition Removal hardware Lumbar one-three. Lumbar four-sacral one,  Removal internal bone growth stimulator;  Surgeon: Kary Kos, MD;  Location: [facility];  Service: Neurosurgery;  Laterality: N/A;  . BACK SURGERY  2004   x 2  . CERVICAL SPINE SURGERY  2008  .  ESOPHAGOGASTRODUODENOSCOPY    . HARDWARE REMOVAL Right 10/09/2014   Procedure: Removal Deep Hardware, Irrigation and Debridement Calcaneus, Place Antibiotic Beads and Wound VAC ;  Surgeon: Newt Minion, MD;  Location: [facility];  Service: Orthopedics;  Laterality: Right;  . HARDWARE REMOVAL Right 08/12/2015   Procedure: Removal Deep Hardware Right Foot;  Surgeon: Newt Minion, MD;  Location: [facility];  Service: Orthopedics;  Laterality: Right;  . I&D EXTREMITY Right 09/15/2014   Procedure: IRRIGATION AND DEBRIDEMENT Ankle;  Surgeon: Renette Butters, MD;  Location: [facility];  Service: Orthopedics;  Laterality: Right;  . INGUINAL HERNIA REPAIR Bilateral   . LUMBAR PERCUTANEOUS PEDICLE SCREW 1 LEVEL N/A 04/05/2017   Procedure: LUMBAR PERCUTANEOUS PEDICLE SCREW LUMBAR THREE-FOUR;  Surgeon: Kary Kos, MD;  Location: [facility];  Service: Neurosurgery;  Laterality: N/A;  . ORIF CALCANEOUS FRACTURE Right 09/19/2014   Procedure: OPEN REDUCTION INTERNAL FIXATION (ORIF) CALCANEOUS FRACTURE;  Surgeon: Newt Minion, MD;  Location: [facility];  Service: Orthopedics;  Laterality: Right;  . ORIF CALCANEOUS FRACTURE Left 09/19/2014   Procedure: OPEN REDUCTION INTERNAL FIXATION (ORIF) CALCANEOUS FRACTURE;  Surgeon: Newt Minion, MD;  Location: [facility];  Service: Orthopedics;  Laterality: Left;    There were no vitals filed for this visit.      Subjective Assessment - 07/02/17 1349    Subjective Pt reports he tripped over his dog and fell backwarks thinking hay would be there for  support last Friday, continues to have problems with Rt foot.  Plans to go by MD office later today regarding PT referral for feet.  Current pain scale 8/10 LBP and 9/10 Rt foot.  Reports complaince with HEP daily.   Pertinent History history of B calcaneal fratures and lumbar fractures after fall off a ladder; history of mulitple surgeries for feet and lumbar spine    Patient Stated Goals reduce pain, be able to walk straight   Currently in Pain? Yes    Pain Score 8    Pain Location Back   Pain Orientation Lower  near tailbone   Pain Descriptors / Indicators Aching   Pain Type Chronic pain   Pain Onset More than a month ago   Pain Frequency Intermittent   Aggravating Factors  standing   Pain Relieving Factors sitting   Effect of Pain on Daily Activities moderate to severe                         [facility] Adult PT Treatment/Exercise - 07/02/17 0001      Lumbar Exercises: Stretches   Active Hamstring Stretch 2 reps;30 seconds   Double Knee to Chest Stretch 2 reps;30 seconds   Lower Trunk Rotation Limitations 10x 10" core activation and mobility     Lumbar Exercises: Seated   Sit to Stand 5 reps   Sit to Stand Limitations eccentric control cueing for equal weight bearing due to Rt foot pain   Other Seated Lumbar Exercises dyna disc: dead bug with no UE/LE A     Lumbar Exercises: Prone   Straight Leg Raise 10 reps   Straight Leg Raises Limitations 1 pillow under hip for comfort     Manual Therapy   Manual Therapy Myofascial release   Manual therapy comments separate rest of session   Soft tissue mobilization STM in prone to QL   Myofascial Release Rt > Lt glute medius, bilat QL                   PT Short Term Goals - 06/20/17 1048      PT SHORT TERM GOAL #1   Title patient to demonstrate improved gait mechanics including upright posture, equal step lengths, straight trajectory, and without reaching for items in environment in order to improve mobility and community access    Baseline 9/13- limited by foot rather than back, back is much improved with gait    Time 2   Period Weeks   Status On-going     PT SHORT TERM GOAL #2   Title patient to be able to maintain correct posture at least 75% of the time in order to demonstrate improved core strength and to improve mechanics    Baseline 9/13- improved    Time 2   Period Weeks   Status Partially Met     PT SHORT TERM GOAL #3   Title Patient to  experience pain as being no more than 6/10 in order to improve QOL and functional task tolerance    Baseline 9/13- remains around 7/10 today; at best, without the foot his back just a 5/10 however    Time 2   Period Weeks   Status Partially Met     PT SHORT TERM GOAL #4   Title Pt will report being able to sleep through the night with 1 to no awakenings due to LBP to maximize recovery and improve overall function.   Baseline 9/13- sleeps  all night    Time 3   Period Weeks   Status Achieved           PT Long Term Goals - 06/20/17 1052      PT LONG TERM GOAL #1   Title Patient to demonstate MMT as having improved by at least 1 grade in order to improve mobility and overall gait pattern    Baseline 9/13- remains impaired    Time 4   Period Weeks   Status On-going     PT LONG TERM GOAL #2   Title Patient to experience pain as being no more than 4/10 in order to improve QOL and tolerance to functional tasks    Baseline 9/13- 5/10   Time 4   Period Weeks   Status On-going     PT LONG TERM GOAL #3   Title Patient to be independent in advanced HEP, to be updated PRN, in order to maintain functional gains and prevent exacdrbation of pain    Baseline 9/13- complaint with HEP    Time 4   Period Weeks   Status Achieved               Plan - 07/02/17 1359    Clinical Impression Statement Pt arrived with increased back pain following reports of falling in barn following tripping over dog.  Began session with mobility exercises/stretches for pain control and manual at EOS to address musculature spasms and tigntness with hip musculature.  Continued with seated core strengthening exercises for balance control,.  Pt continues to c/o feet pain, unable to complete standing balance due to pain.  Encouraged pt to call MD about referral to begin PT for foot.  EOS pt reports decrease in back pain to 7/10 was 8.5/10 at beginning of session.     Rehab Potential Good   Clinical Impairments  Affecting Rehab Potential (+) age, overall health status; (-) chronicity of pain, multiple surgeries, mixed outcomes of PT earlier this year, mixed motivation    PT Frequency 2x / week   PT Duration 2 weeks   PT Treatment/Interventions ADLs/Self Care Home Management;Biofeedback;Cryotherapy;Electrical Stimulation;Moist Heat;DME Instruction;Gait training;Stair training;Functional mobility training;Therapeutic activities;Therapeutic exercise;Balance training;Neuromuscular re-education;Patient/family education;Manual techniques;Energy conservation;Taping   PT Next Visit Plan continue with MFR to glutes and QL; glute med strength, hip strength and stability. F/U about possiblity of PT for foot. Update HEP    PT Home Exercise Plan Eval: bridiges, brace supine marches, supine clams with red TB, TA activation, thomas position hip flexor stretch      Patient will benefit from skilled therapeutic intervention in order to improve the following deficits and impairments:  Abnormal gait, Increased fascial restricitons, Improper body mechanics, Pain, Decreased coordination, Decreased mobility, Postural dysfunction, Decreased activity tolerance, Decreased strength, Decreased range of motion, Hypomobility, Decreased balance, Difficulty walking, Impaired flexibility  Visit Diagnosis: Chronic bilateral low back pain without sciatica  Other symptoms and signs involving the musculoskeletal system  Abnormal posture  Muscle weakness (generalized)  Difficulty in walking, not elsewhere classified  Chronic bilateral low back pain with left-sided sciatica     Problem List Patient Active Problem List   Diagnosis Date Noted  . DDD (degenerative disc disease), lumbar 04/05/2017  . Fracture of L2 vertebra ([place]) 01/12/2015  . Acute osteomyelitis of calcaneum ([place]) 10/09/2014  . Dysuria 10/05/2014  . Cellulitis 10/05/2014  . Fall from ladder 09/21/2014  . L2 vertebral fracture ([place]) 09/21/2014  . Bilateral  calcaneal fractures 09/21/2014  . Chronic pain 09/21/2014  .  Acute blood loss anemia 09/21/2014  . Open right calcaneal fracture 09/15/2014   Bradley Espinoza, [place]; [place]  Aldona Lento 07/02/2017, 3:49 PM  [facility] [address] [place], [place], [address] Phone: [phone]   Fax:  [phone]  Name: Bradley Espinoza MRN: [id number] Date of Birth: [birth year]

## 2017-07-04 ENCOUNTER — Ambulatory Visit (HOSPITAL_COMMUNITY): Payer: PPO | Admitting: Physical Therapy

## 2017-07-04 DIAGNOSIS — M6281 Muscle weakness (generalized): Secondary | ICD-10-CM

## 2017-07-04 DIAGNOSIS — R293 Abnormal posture: Secondary | ICD-10-CM

## 2017-07-04 DIAGNOSIS — M545 Low back pain, unspecified: Secondary | ICD-10-CM

## 2017-07-04 DIAGNOSIS — Z6821 Body mass index (BMI) 21.0-21.9, adult: Secondary | ICD-10-CM | POA: Diagnosis not present

## 2017-07-04 DIAGNOSIS — R262 Difficulty in walking, not elsewhere classified: Secondary | ICD-10-CM

## 2017-07-04 DIAGNOSIS — G8929 Other chronic pain: Secondary | ICD-10-CM

## 2017-07-04 DIAGNOSIS — R29898 Other symptoms and signs involving the musculoskeletal system: Secondary | ICD-10-CM

## 2017-07-04 DIAGNOSIS — G894 Chronic pain syndrome: Secondary | ICD-10-CM | POA: Diagnosis not present

## 2017-07-04 DIAGNOSIS — H6500 Acute serous otitis media, unspecified ear: Secondary | ICD-10-CM | POA: Diagnosis not present

## 2017-07-04 DIAGNOSIS — G9009 Other idiopathic peripheral autonomic neuropathy: Secondary | ICD-10-CM | POA: Diagnosis not present

## 2017-07-04 NOTE — Therapy (Signed)
[place], [place], [address] Phone: [phone]   Fax:  [phone]  Physical Therapy Treatment  Patient Details  Name: Bradley Espinoza MRN: [id number] Date of Birth: [birth year] Referring Provider: Spero Geralds   Encounter Date: 07/04/2017      PT End of Session - 07/04/17 1415    Visit Number 10   Number of Visits 11   Authorization Type [employer] (G-codes done 7th session)   Authorization Time Period 6/96/29 to 03/04/40; recert done 3/24   Authorization - Visit Number 10   Authorization - Number of Visits 17   PT Start Time 1340   PT Stop Time 1425   PT Time Calculation (min) 45 min   Activity Tolerance Patient tolerated treatment well;Patient limited by pain;No increased pain   Behavior During Therapy WFL for tasks assessed/performed      Past Medical History:  Diagnosis Date  . Anxiety   . Arthritis    low back pain, lumbar radiculopathy  . Depression   . Fracture    B/L ankles  . GERD (gastroesophageal reflux disease)   . Headache(784.0)    allergy related   . History of kidney stones   . History of stomach ulcers   . Retained orthopedic hardware    failed retained hardware right foot  . Wears glasses     Past Surgical History:  Procedure Laterality Date  . ANKLE FUSION Right 05/11/2015   Procedure: Right Posterior Arthroscopic Subtalar Arthrodesis;  Surgeon: Newt Minion, MD;  Location: [facility];  Service: Orthopedics;  Laterality: Right;  . ANTERIOR LAT LUMBAR FUSION N/A 04/05/2017   Procedure: Extreme Lateral Interbody Fusion - Lumbar three-lumbar four ,exploration of fusion Posterior augmentation with globus addition Removal hardware Lumbar one-three. Lumbar four-sacral one,  Removal internal bone growth stimulator;  Surgeon: Kary Kos, MD;  Location: [facility];  Service: Neurosurgery;  Laterality: N/A;  . BACK SURGERY  2004   x 2  . CERVICAL SPINE SURGERY  2008  .  ESOPHAGOGASTRODUODENOSCOPY    . HARDWARE REMOVAL Right 10/09/2014   Procedure: Removal Deep Hardware, Irrigation and Debridement Calcaneus, Place Antibiotic Beads and Wound VAC ;  Surgeon: Newt Minion, MD;  Location: [facility];  Service: Orthopedics;  Laterality: Right;  . HARDWARE REMOVAL Right 08/12/2015   Procedure: Removal Deep Hardware Right Foot;  Surgeon: Newt Minion, MD;  Location: [facility];  Service: Orthopedics;  Laterality: Right;  . I&D EXTREMITY Right 09/15/2014   Procedure: IRRIGATION AND DEBRIDEMENT Ankle;  Surgeon: Renette Butters, MD;  Location: [facility];  Service: Orthopedics;  Laterality: Right;  . INGUINAL HERNIA REPAIR Bilateral   . LUMBAR PERCUTANEOUS PEDICLE SCREW 1 LEVEL N/A 04/05/2017   Procedure: LUMBAR PERCUTANEOUS PEDICLE SCREW LUMBAR THREE-FOUR;  Surgeon: Kary Kos, MD;  Location: [facility];  Service: Neurosurgery;  Laterality: N/A;  . ORIF CALCANEOUS FRACTURE Right 09/19/2014   Procedure: OPEN REDUCTION INTERNAL FIXATION (ORIF) CALCANEOUS FRACTURE;  Surgeon: Newt Minion, MD;  Location: [facility];  Service: Orthopedics;  Laterality: Right;  . ORIF CALCANEOUS FRACTURE Left 09/19/2014   Procedure: OPEN REDUCTION INTERNAL FIXATION (ORIF) CALCANEOUS FRACTURE;  Surgeon: Newt Minion, MD;  Location: [facility];  Service: Orthopedics;  Laterality: Left;    There were no vitals filed for this visit.      Subjective Assessment - 07/04/17 1337    Subjective PT  states that he is still pretty sore from the fall .   He  went to his MD about his foot today who is suppose to be sending an order for therapy for his feet.    Pertinent History history of B calcaneal fratures and lumbar fractures after fall off a ladder; history of mulitple surgeries for feet and lumbar spine    Patient Stated Goals reduce pain, be able to walk straight   Pain Score 8    Pain Location Back   Pain Orientation Lower   Pain Descriptors / Indicators Aching   Pain Type Chronic pain   Pain Onset More than a month ago    Pain Frequency Constant   Aggravating Factors  not sure   Pain Relieving Factors not sure   Effect of Pain on Daily Activities increases   Multiple Pain Sites Yes   Pain Score 9   Pain Location Foot   Pain Orientation Right;Left   Pain Descriptors / Indicators Throbbing   Pain Type Chronic pain   Pain Onset More than a month ago   Pain Frequency Constant   Aggravating Factors  walking    Pain Relieving Factors not sure                          [facility] Adult PT Treatment/Exercise - 07/04/17 0001      Lumbar Exercises: Stretches   Single Knee to Chest Stretch 2 reps;30 seconds   Lower Trunk Rotation Limitations 10x 10" core activation and mobility   Prone on Elbows Stretch 3 reps;30 seconds   Quad Stretch 3 reps;30 seconds     Lumbar Exercises: Supine   Bent Knee Raise 10 reps  2x10 bilat   Bridge 15 reps   Other Supine Lumbar Exercises hip adduction isometric x 10      Lumbar Exercises: Prone   Straight Leg Raise 10 reps   Straight Leg Raises Limitations 2 pillow under hip for comfort   Other Prone Lumbar Exercises glut and ab sets, heel squeezes x 10   Other Prone Lumbar Exercises Rows, shoulder extension Hip IR/ER B x 10      Modalities   Modalities Moist Heat     Moist Heat Therapy   Number Minutes Moist Heat 15 Minutes   Moist Heat Location Lumbar Spine                  PT Short Term Goals - 06/20/17 1048      PT SHORT TERM GOAL #1   Title patient to demonstrate improved gait mechanics including upright posture, equal step lengths, straight trajectory, and without reaching for items in environment in order to improve mobility and community access    Baseline 9/13- limited by foot rather than back, back is much improved with gait    Time 2   Period Weeks   Status On-going     PT SHORT TERM GOAL #2   Title patient to be able to maintain correct posture at least 75% of the time in order to demonstrate improved core strength and to  improve mechanics    Baseline 9/13- improved    Time 2   Period Weeks   Status Partially Met     PT SHORT TERM GOAL #3   Title Patient to experience pain as being no more than 6/10 in order to improve QOL and functional task tolerance    Baseline 9/13- remains around 7/10 today; at best, without the foot his back just a 5/10 however    Time 2  Period Weeks   Status Partially Met     PT SHORT TERM GOAL #4   Title Pt will report being able to sleep through the night with 1 to no awakenings due to LBP to maximize recovery and improve overall function.   Baseline 9/13- sleeps all night    Time 3   Period Weeks   Status Achieved           PT Long Term Goals - 06/20/17 1052      PT LONG TERM GOAL #1   Title Patient to demonstate MMT as having improved by at least 1 grade in order to improve mobility and overall gait pattern    Baseline 9/13- remains impaired    Time 4   Period Weeks   Status On-going     PT LONG TERM GOAL #2   Title Patient to experience pain as being no more than 4/10 in order to improve QOL and tolerance to functional tasks    Baseline 9/13- 5/10   Time 4   Period Weeks   Status On-going     PT LONG TERM GOAL #3   Title Patient to be independent in advanced HEP, to be updated PRN, in order to maintain functional gains and prevent exacdrbation of pain    Baseline 9/13- complaint with HEP    Time 4   Period Weeks   Status Achieved               Plan - 07/04/17 1410    Clinical Impression Statement Pt continues to have significant pain in both of his feet therefore therapist kept treatement nonweight bearing.  Pt still having increased pain from his fall therefore therapist placed [place] on back with exercises.    Rehab Potential Good   Clinical Impairments Affecting Rehab Potential (+) age, overall health status; (-) chronicity of pain, multiple surgeries, mixed outcomes of PT earlier this year, mixed motivation    PT Frequency 2x / week   PT  Duration 2 weeks   PT Treatment/Interventions ADLs/Self Care Home Management;Biofeedback;Cryotherapy;Electrical Stimulation;Moist Heat;DME Instruction;Gait training;Stair training;Functional mobility training;Therapeutic activities;Therapeutic exercise;Balance training;Neuromuscular re-education;Patient/family education;Manual techniques;Energy conservation;Taping   PT Next Visit Plan Continue with stabilization and stretching exercises.     PT Home Exercise Plan Eval: bridiges, brace supine marches, supine clams with red TB, TA activation, thomas position hip flexor stretch      Patient will benefit from skilled therapeutic intervention in order to improve the following deficits and impairments:  Abnormal gait, Increased fascial restricitons, Improper body mechanics, Pain, Decreased coordination, Decreased mobility, Postural dysfunction, Decreased activity tolerance, Decreased strength, Decreased range of motion, Hypomobility, Decreased balance, Difficulty walking, Impaired flexibility  Visit Diagnosis: Chronic bilateral low back pain without sciatica  Other symptoms and signs involving the musculoskeletal system  Abnormal posture  Muscle weakness (generalized)  Difficulty in walking, not elsewhere classified     Problem List Patient Active Problem List   Diagnosis Date Noted  . DDD (degenerative disc disease), lumbar 04/05/2017  . Fracture of L2 vertebra ([facility]) 01/12/2015  . Acute osteomyelitis of calcaneum ([facility]) 10/09/2014  . Dysuria 10/05/2014  . Cellulitis 10/05/2014  . Fall from ladder 09/21/2014  . L2 vertebral fracture ([facility]) 09/21/2014  . Bilateral calcaneal fractures 09/21/2014  . Chronic pain 09/21/2014  . Acute blood loss anemia 09/21/2014  . Open right calcaneal fracture 09/15/2014   Rayetta Humphrey, PT CLT [phone] 07/04/2017, 2:26 PM  [place], [place],  [place] Phone: [phone]   Fax:   [phone]  Name: HANNA AULTMAN MRN: [id number] Date of Birth: 11-01-1955

## 2017-07-04 NOTE — Patient Instructions (Addendum)
On Elbows (Prone)    Rise up on elbows as high as possible, keeping hips on floor. Hold __30__ seconds. Repeat __3__ times per set. Do __1__ sets per session. Do __2__ sessions per day.  http://orth.exer.us/92   Copyright  VHI. All rights reserved.  Strengthening: Hip Adduction - Isometric    With ball or folded pillow between knees, squeeze knees together. Hold __5__ seconds. Repeat _10___ times per set. Do __1__ sets per session. Do __2__ sessions per day.  http://orth.exer.us/612   Copyright  VHI. All rights reserved.  Strengthening: Hip Extension (Prone)    Tighten muscles on front of left thigh, then lift leg __2__ inches from surface, keeping knee locked. Repeat _10__ times per set. Do _1__ sets per session. Do _2___ sessions per day.  http://orth.exer.us/620   Copyright  VHI. All rights reserved.  Heel Squeeze (Prone)    Abdomen supported, bend knees and gently squeeze heels together. Hold ___3_ seconds. Repeat 10____ times per set. Do __1__ sets per session. Do __2__ sessions per day.  http://orth.exer.us/1080   Copyright  VHI. All rights reserved.

## 2017-07-08 DIAGNOSIS — M5137 Other intervertebral disc degeneration, lumbosacral region: Secondary | ICD-10-CM | POA: Diagnosis not present

## 2017-07-09 ENCOUNTER — Encounter (HOSPITAL_COMMUNITY): Payer: Self-pay

## 2017-07-09 ENCOUNTER — Ambulatory Visit (HOSPITAL_COMMUNITY): Payer: PPO | Attending: Neurosurgery

## 2017-07-09 DIAGNOSIS — R29898 Other symptoms and signs involving the musculoskeletal system: Secondary | ICD-10-CM | POA: Diagnosis not present

## 2017-07-09 DIAGNOSIS — R262 Difficulty in walking, not elsewhere classified: Secondary | ICD-10-CM

## 2017-07-09 DIAGNOSIS — M25671 Stiffness of right ankle, not elsewhere classified: Secondary | ICD-10-CM | POA: Diagnosis not present

## 2017-07-09 DIAGNOSIS — R293 Abnormal posture: Secondary | ICD-10-CM | POA: Diagnosis not present

## 2017-07-09 DIAGNOSIS — M6281 Muscle weakness (generalized): Secondary | ICD-10-CM

## 2017-07-09 DIAGNOSIS — G8929 Other chronic pain: Secondary | ICD-10-CM | POA: Diagnosis not present

## 2017-07-09 DIAGNOSIS — M545 Low back pain: Secondary | ICD-10-CM | POA: Diagnosis not present

## 2017-07-09 DIAGNOSIS — M25571 Pain in right ankle and joints of right foot: Secondary | ICD-10-CM | POA: Diagnosis not present

## 2017-07-09 NOTE — Therapy (Signed)
[place], [place], [address] Phone: [phone]   Fax:  [phone]  Physical Therapy Treatment/Discharge Summary  Patient Details  Name: EMAAD NANNA MRN: [id number] Date of Birth: 1956-06-09 Referring Provider: Spero Geralds   Encounter Date: 07/09/2017      PT End of Session - 07/09/17 1432    Visit Number 11   Number of Visits 11   Authorization Type [employer] (G-codes done 7th session)   Authorization Time Period 9/89/21 to 1/94/17; recert done 4/08   Authorization - Visit Number 11   Authorization - Number of Visits 17   PT Start Time 1430   PT Stop Time 1509   PT Time Calculation (min) 39 min   Activity Tolerance Patient tolerated treatment well;Patient limited by pain;No increased pain   Behavior During Therapy WFL for tasks assessed/performed      Past Medical History:  Diagnosis Date  . Anxiety   . Arthritis    low back pain, lumbar radiculopathy  . Depression   . Fracture    B/L ankles  . GERD (gastroesophageal reflux disease)   . Headache(784.0)    allergy related   . History of kidney stones   . History of stomach ulcers   . Retained orthopedic hardware    failed retained hardware right foot  . Wears glasses     Past Surgical History:  Procedure Laterality Date  . ANKLE FUSION Right 05/11/2015   Procedure: Right Posterior Arthroscopic Subtalar Arthrodesis;  Surgeon: Newt Minion, MD;  Location: [facility];  Service: Orthopedics;  Laterality: Right;  . ANTERIOR LAT LUMBAR FUSION N/A 04/05/2017   Procedure: Extreme Lateral Interbody Fusion - Lumbar three-lumbar four ,exploration of fusion Posterior augmentation with globus addition Removal hardware Lumbar one-three. Lumbar four-sacral one,  Removal internal bone growth stimulator;  Surgeon: Kary Kos, MD;  Location: [facility];  Service: Neurosurgery;  Laterality: N/A;  . BACK SURGERY  2004   x 2  . CERVICAL SPINE SURGERY  2008   . ESOPHAGOGASTRODUODENOSCOPY    . HARDWARE REMOVAL Right 10/09/2014   Procedure: Removal Deep Hardware, Irrigation and Debridement Calcaneus, Place Antibiotic Beads and Wound VAC ;  Surgeon: Newt Minion, MD;  Location: [facility];  Service: Orthopedics;  Laterality: Right;  . HARDWARE REMOVAL Right 08/12/2015   Procedure: Removal Deep Hardware Right Foot;  Surgeon: Newt Minion, MD;  Location: [facility];  Service: Orthopedics;  Laterality: Right;  . I&D EXTREMITY Right 09/15/2014   Procedure: IRRIGATION AND DEBRIDEMENT Ankle;  Surgeon: Renette Butters, MD;  Location: [facility];  Service: Orthopedics;  Laterality: Right;  . INGUINAL HERNIA REPAIR Bilateral   . LUMBAR PERCUTANEOUS PEDICLE SCREW 1 LEVEL N/A 04/05/2017   Procedure: LUMBAR PERCUTANEOUS PEDICLE SCREW LUMBAR THREE-FOUR;  Surgeon: Kary Kos, MD;  Location: [facility];  Service: Neurosurgery;  Laterality: N/A;  . ORIF CALCANEOUS FRACTURE Right 09/19/2014   Procedure: OPEN REDUCTION INTERNAL FIXATION (ORIF) CALCANEOUS FRACTURE;  Surgeon: Newt Minion, MD;  Location: [facility];  Service: Orthopedics;  Laterality: Right;  . ORIF CALCANEOUS FRACTURE Left 09/19/2014   Procedure: OPEN REDUCTION INTERNAL FIXATION (ORIF) CALCANEOUS FRACTURE;  Surgeon: Newt Minion, MD;  Location: [facility];  Service: Orthopedics;  Laterality: Left;    There were no vitals filed for this visit.      Subjective Assessment - 07/09/17 1432    Subjective Pt states that he's feeling bad. He states that his R side is hurting and  is unsure if it is his exercises. He states that he is just more sore. Standing up for a long time makes it hurt worse. He saw his doctor yesterday and had x-rays which showed everything was looking good. They told him to see his pain clinic doctor about his back pain. He finally saw his doctor regarding his foot pain and has an eval scheduled for it tomorrow.    Pertinent History history of B calcaneal fratures and lumbar fractures after fall off a ladder; history  of mulitple surgeries for feet and lumbar spine    Patient Stated Goals reduce pain, be able to walk straight   Currently in Pain? Yes   Pain Score 8    Pain Location Back   Pain Orientation Mid;Lower   Pain Descriptors / Indicators Aching   Pain Type Chronic pain   Pain Radiating Towards down into R buttocks a little    Pain Onset More than a month ago   Pain Frequency Constant   Aggravating Factors  not sure   Pain Relieving Factors not sure   Effect of Pain on Daily Activities increases   Multiple Pain Sites Yes   Pain Score --  10.5/10   Pain Location Foot   Pain Orientation Right   Pain Descriptors / Indicators Throbbing   Pain Type Chronic pain   Pain Onset More than a month ago   Pain Frequency Constant   Aggravating Factors  walking   Pain Relieving Factors not sure            [facility] PT Assessment - 07/09/17 0001      Strength   Right Hip Flexion 5/5  was 4+   Right Hip ABduction 5/5  was 4   Left Hip Flexion 5/5  was 4   Left Hip ABduction 5/5  was 4+   Right Knee Flexion 5/5  was 4+   Left Knee Flexion 5/5  was 4     Ambulation/Gait   Ambulation Distance (Feet) 200 Feet   Assistive device None   Gait Pattern Step-through pattern;Antalgic;Trendelenburg   Gait Comments good upright posture during ambulation, still reaching for furniture/wall, antalgic on R foot which was his limiting factor               [facility] Adult PT Treatment/Exercise - 07/09/17 0001      Manual Therapy   Manual Therapy Myofascial release   Manual therapy comments separate rest of session   Myofascial Release R glute med x 5 mins                PT Short Term Goals - 07/09/17 1438      PT SHORT TERM GOAL #1   Title patient to demonstrate improved gait mechanics including upright posture, equal step lengths, straight trajectory, and without reaching for items in environment in order to improve mobility and community access    Baseline 10/2: limited by R foot  rather than back, back is much improved with gait; still reaching for furniture/wall but overall much improved upright posture and equal step lengths   Time 2   Period Weeks   Status Partially Met     PT SHORT TERM GOAL #2   Title patient to be able to maintain correct posture at least 75% of the time in order to demonstrate improved core strength and to improve mechanics    Baseline 10/2: posture is improving and he feels like he can attain and maintain proper posture 75% of  the time   Time 2   Period Weeks   Status Achieved     PT SHORT TERM GOAL #3   Title Patient to experience pain as being no more than 6/10 in order to improve QOL and functional task tolerance    Baseline 10/2: he states that his LBP still gets up to a 10/10 if he is active and does a lot; the lowest his pain rating is 6/10   Time 2   Period Weeks   Status Partially Met     PT SHORT TERM GOAL #4   Title Pt will report being able to sleep through the night with 1 to no awakenings due to LBP to maximize recovery and improve overall function.   Baseline 9/13- sleeps all night    Time 3   Period Weeks   Status Achieved           PT Long Term Goals - 07/09/17 1440      PT LONG TERM GOAL #1   Title Patient to demonstate MMT as having improved by at least 1 grade in order to improve mobility and overall gait pattern    Baseline 10/2: 5/5 throughout all muscle groups tested   Time 4   Period Weeks   Status Achieved     PT LONG TERM GOAL #2   Title Patient to experience pain as being no more than 4/10 in order to improve QOL and tolerance to functional tasks    Baseline 10/2: he states that his LBP still gets up to a 10/10 if he is active and does a lot; the lowest his pain rating is 6/10   Time 4   Period Weeks   Status On-going     PT LONG TERM GOAL #3   Title Patient to be independent in advanced HEP, to be updated PRN, in order to maintain functional gains and prevent exacdrbation of pain    Baseline  9/13- complaint with HEP    Time 4   Period Weeks   Status Achieved               Plan - 07/09/17 1509    Clinical Impression Statement PT reassessed pt's goals and outcome measures this date. Pt had made great progress towards all of his goals as illustrated above. Pt's strength, ambulation, and his overall function have all significantly improved. Pt feesls 70% better since starting therapy, reporting that his only limitation is his R hip pain and his R foot pain. His foot pain was his main limiting factor throughout entire session. Due to progress made, pt will be discharged from his LBP POC and he was educated to continue his current HEP. Pt received a referral for his R foot pain and will be evaluated at this clinic tomorrow.    Rehab Potential Good   Clinical Impairments Affecting Rehab Potential (+) age, overall health status; (-) chronicity of pain, multiple surgeries, mixed outcomes of PT earlier this year, mixed motivation    PT Frequency 2x / week   PT Duration 2 weeks   PT Treatment/Interventions ADLs/Self Care Home Management;Biofeedback;Cryotherapy;Electrical Stimulation;Moist Heat;DME Instruction;Gait training;Stair training;Functional mobility training;Therapeutic activities;Therapeutic exercise;Balance training;Neuromuscular re-education;Patient/family education;Manual techniques;Energy conservation;Taping   PT Next Visit Plan discharge   PT Home Exercise Plan Eval: bridiges, brace supine marches, supine clams with red TB, TA activation, thomas position hip flexor stretch   Consulted and Agree with Plan of Care Patient      Patient will benefit from skilled therapeutic intervention  in order to improve the following deficits and impairments:  Abnormal gait, Increased fascial restricitons, Improper body mechanics, Pain, Decreased coordination, Decreased mobility, Postural dysfunction, Decreased activity tolerance, Decreased strength, Decreased range of motion, Hypomobility,  Decreased balance, Difficulty walking, Impaired flexibility  Visit Diagnosis: Chronic bilateral low back pain without sciatica  Other symptoms and signs involving the musculoskeletal system  Abnormal posture  Muscle weakness (generalized)  Difficulty in walking, not elsewhere classified       G-Codes - 03-Aug-2017 1515    Functional Assessment Tool Used (Outpatient Only) Based on skilled clinical assessment of posture, strength, gait, fall risk, pain patterns   Functional Limitation Mobility: Walking and moving around   Mobility: Walking and Moving Around Current Status ([id number]) At least 20 percent but less than 40 percent impaired, limited or restricted   Mobility: Walking and Moving Around Goal Status [phone]) At least 20 percent but less than 40 percent impaired, limited or restricted      Problem List Patient Active Problem List   Diagnosis Date Noted  . DDD (degenerative disc disease), lumbar 04/05/2017  . Fracture of L2 vertebra ([facility]) 01/12/2015  . Acute osteomyelitis of calcaneum ([facility]) 10/09/2014  . Dysuria 10/05/2014  . Cellulitis 10/05/2014  . Fall from ladder 09/21/2014  . L2 vertebral fracture ([facility]) 09/21/2014  . Bilateral calcaneal fractures 09/21/2014  . Chronic pain 09/21/2014  . Acute blood loss anemia 09/21/2014  . Open right calcaneal fracture 09/15/2014     PHYSICAL THERAPY DISCHARGE SUMMARY  Visits from Start of Care: 11  Current functional level related to goals / functional outcomes: See clinical impression above   Remaining deficits: See clinical impression above   Education / Equipment: Continue HEP Plan: Patient agrees to discharge.  Patient goals were partially met. Patient is being discharged due to meeting the stated rehab goals.  ?????       Geraldine Solar PT, [facility] [address] [place], [place], [address] Phone: [phone]   Fax:  [phone]  Name: YOSHIAKI KREUSER MRN: [id number] Date of Birth: 1956-05-17

## 2017-07-10 ENCOUNTER — Encounter (HOSPITAL_COMMUNITY): Payer: Self-pay

## 2017-07-10 ENCOUNTER — Ambulatory Visit (HOSPITAL_COMMUNITY): Payer: PPO

## 2017-07-10 DIAGNOSIS — R262 Difficulty in walking, not elsewhere classified: Secondary | ICD-10-CM

## 2017-07-10 DIAGNOSIS — M25671 Stiffness of right ankle, not elsewhere classified: Secondary | ICD-10-CM

## 2017-07-10 DIAGNOSIS — M545 Low back pain: Secondary | ICD-10-CM | POA: Diagnosis not present

## 2017-07-10 DIAGNOSIS — R29898 Other symptoms and signs involving the musculoskeletal system: Secondary | ICD-10-CM

## 2017-07-10 DIAGNOSIS — M25571 Pain in right ankle and joints of right foot: Secondary | ICD-10-CM

## 2017-07-10 NOTE — Therapy (Signed)
[facility] [address] [place], [place], [address] Phone: [phone]   Fax:  [phone]  Physical Therapy Evaluation  Patient Details  Name: Bradley Espinoza MRN: [id number] Date of Birth: Apr 23, 1956 Referring Provider: Celene Squibb, MD  Encounter Date: 07/10/2017      PT End of Session - 07/10/17 1119    Visit Number 1   Number of Visits 9   Date for PT Re-Evaluation 08/07/17   Authorization Type [employer]   Authorization Time Period 07/10/17 to 08/07/17   Authorization - Visit Number 1   Authorization - Number of Visits 10   PT Start Time 1030   PT Stop Time 1114   PT Time Calculation (min) 44 min   Activity Tolerance Patient tolerated treatment well   Behavior During Therapy [facility] for tasks assessed/performed      Past Medical History:  Diagnosis Date  . Anxiety   . Arthritis    low back pain, lumbar radiculopathy  . Depression   . Fracture    B/L ankles  . GERD (gastroesophageal reflux disease)   . Headache(784.0)    allergy related   . History of kidney stones   . History of stomach ulcers   . Retained orthopedic hardware    failed retained hardware right foot  . Wears glasses     Past Surgical History:  Procedure Laterality Date  . ANKLE FUSION Right 05/11/2015   Procedure: Right Posterior Arthroscopic Subtalar Arthrodesis;  Surgeon: Newt Minion, MD;  Location: [place];  Service: Orthopedics;  Laterality: Right;  . ANTERIOR LAT LUMBAR FUSION N/A 04/05/2017   Procedure: Extreme Lateral Interbody Fusion - Lumbar three-lumbar four ,exploration of fusion Posterior augmentation with globus addition Removal hardware Lumbar one-three. Lumbar four-sacral one,  Removal internal bone growth stimulator;  Surgeon: Kary Kos, MD;  Location: [place];  Service: Neurosurgery;  Laterality: N/A;  . BACK SURGERY  2004   x 2  . CERVICAL SPINE SURGERY  2008  . ESOPHAGOGASTRODUODENOSCOPY    . HARDWARE REMOVAL Right  10/09/2014   Procedure: Removal Deep Hardware, Irrigation and Debridement Calcaneus, Place Antibiotic Beads and Wound VAC ;  Surgeon: Newt Minion, MD;  Location: [place];  Service: Orthopedics;  Laterality: Right;  . HARDWARE REMOVAL Right 08/12/2015   Procedure: Removal Deep Hardware Right Foot;  Surgeon: Newt Minion, MD;  Location: [place];  Service: Orthopedics;  Laterality: Right;  . I&D EXTREMITY Right 09/15/2014   Procedure: IRRIGATION AND DEBRIDEMENT Ankle;  Surgeon: Renette Butters, MD;  Location: [place];  Service: Orthopedics;  Laterality: Right;  . INGUINAL HERNIA REPAIR Bilateral   . LUMBAR PERCUTANEOUS PEDICLE SCREW 1 LEVEL N/A 04/05/2017   Procedure: LUMBAR PERCUTANEOUS PEDICLE SCREW LUMBAR THREE-FOUR;  Surgeon: Kary Kos, MD;  Location: [place];  Service: Neurosurgery;  Laterality: N/A;  . ORIF CALCANEOUS FRACTURE Right 09/19/2014   Procedure: OPEN REDUCTION INTERNAL FIXATION (ORIF) CALCANEOUS FRACTURE;  Surgeon: Newt Minion, MD;  Location: [place];  Service: Orthopedics;  Laterality: Right;  . ORIF CALCANEOUS FRACTURE Left 09/19/2014   Procedure: OPEN REDUCTION INTERNAL FIXATION (ORIF) CALCANEOUS FRACTURE;  Surgeon: Newt Minion, MD;  Location: [place];  Service: Orthopedics;  Laterality: Left;    There were no vitals filed for this visit.       Subjective Assessment - 07/10/17 1034    Subjective Pt states that his R foot has been having increased pain since the last 6 months. He feels it  is due to him being able to walk more since his LBP has improved. He initially injured it December 2015 when he fell off of a ladder, and he had about 5 surgeries in 2016 for his R ankle to place and remove hardware. Walking is the most difficult thing for him to do because of the R foot pain. He states that his pain is throbbing around his whole ankle and heel and when he walks it burns on the bottom of his foot. He also has this same issue with his L foot but it is much less.    Pertinent History  history of B calcaneal fratures and lumbar fractures after fall off a ladder; history of mulitple surgeries for feet and lumbar spine    Limitations Standing;Walking;House hold activities   How long can you sit comfortably? unlimited   How long can you stand comfortably? 30 mins   How long can you walk comfortably? 30 mins or less   Patient Stated Goals walk and stand longer with no pain   Currently in Pain? Yes   Pain Score 8    Pain Location Ankle   Pain Orientation Right   Pain Descriptors / Indicators Throbbing;Aching   Pain Type Chronic pain   Pain Onset More than a month ago   Pain Frequency Constant   Aggravating Factors  walking and standing   Pain Relieving Factors pain meds, icy-hot   Effect of Pain on Daily Activities increases            [facility] PT Assessment - 07/10/17 0001      Assessment   Medical Diagnosis R foot pain   Referring Provider Celene Squibb, MD   Onset Date/Surgical Date --  Broke ankle in December 2015, worsening for the last 6 month   Next MD Visit Dr. [place] Crane around 07/19/17   Prior Therapy PT here for LBP, d/c'd on 10/2     Precautions   Precautions Back   Precaution Comments per surgeon no bending/lifting/twisting, should be in back brace      Balance Screen   Has the patient fallen in the past 6 months Yes   How many times? 1   Has the patient had a decrease in activity level because of a fear of falling?  Yes   Is the patient reluctant to leave their home because of a fear of falling?  No     Prior Function   Level of Independence Independent;Independent with basic ADLs;Independent with gait;Independent with transfers   Vocation On disability     Observation/Other Assessments   Focus on Therapeutic Outcomes (FOTO)  65% limitations     Observation/Other Assessments-Edema    Edema Figure 8     Figure 8 Edema   Figure 8 - Right  20.25"   Figure 8 - Left  20.25"     ROM / Strength   AROM / PROM / Strength AROM;Strength     AROM    Overall AROM Comments PROM DF approximately neutral, PF still limited, inversion/eversion improved ~5 deg with PROM   Right Ankle Dorsiflexion -5   Right Ankle Plantar Flexion 30   Right Ankle Inversion 10   Right Ankle Eversion 6   Left Ankle Dorsiflexion 5   Left Ankle Plantar Flexion 40   Left Ankle Inversion 21   Left Ankle Eversion 8     Strength   Right Hip Flexion 5/5   Right Hip ABduction 5/5   Left Hip Flexion 5/5  Left Hip ABduction 5/5   Right Knee Flexion 5/5   Left Knee Flexion 5/5   Right Ankle Dorsiflexion 5/5   Right Ankle Inversion 5/5   Right Ankle Eversion 4+/5   Left Ankle Dorsiflexion 5/5   Left Ankle Inversion 5/5  through available range   Left Ankle Eversion 5/5  through available range     Palpation   Palpation comment increased soft tissue restrictions of medial and lateral ankle musculature, plantar fascia, R>L throughout; R ankle joint hypomobile for AP     Ambulation/Gait   Ambulation Distance (Feet) 648 Feet  3MWT   Assistive device None   Gait Pattern Step-through pattern;Decreased stance time - right;Decreased step length - left;Decreased dorsiflexion - right;Decreased weight shift to right;Antalgic;Trendelenburg  decreased puse-off R, R toe ER   Gait Comments gait deviations increased with time; burning pain in foot increased, rated it 8/10.     Balance   Balance Assessed Yes     Static Standing Balance   Static Standing - Balance Support No upper extremity supported   Static Standing Balance -  Activities  Single Leg Stance - Right Leg;Single Leg Stance - Left Leg   Static Standing - Comment/# of Minutes R: 2 sec L: 15 sec        Objective measurements completed on examination: See above findings.           PT Education - 07/10/17 1114    Education provided Yes   Education Details exam findings, POC, HEP   Person(s) Educated Patient   Methods Explanation;Demonstration;Handout   Comprehension Verbalized  understanding;Returned demonstration          PT Short Term Goals - 07/10/17 1211      PT SHORT TERM GOAL #1   Title Pt will be independent with HEP and perform consistently to decrease pain and maximize overall function.    Time 2   Period Weeks   Status New   Target Date 07/24/17     PT SHORT TERM GOAL #2   Title Pt will have improved R ankle AROM by at least 5 deg to decrease pain and maximize gait.   Time 2   Period Weeks   Status New           PT Long Term Goals - 07/10/17 1212      PT LONG TERM GOAL #1   Title Pt will have improved R ankle DF and PF AROM by at least 10 deg in order to decrease pain, maximize gait and balance.    Time 4   Period Weeks   Status New   Target Date 08/07/17     PT LONG TERM GOAL #2   Title Pt will have improved 3MWT to at least 771ft with R foot pain no more than 4/10 to demo improved overall function and maximize community access.    Time 4   Period Weeks   Status New     PT LONG TERM GOAL #3   Title Pt will be able to perform R SLS for at least 10 sec to demo improved overall stability and functional strength of the R ankle in order to maximize gait.    Time 4   Period Weeks   Status New     PT LONG TERM GOAL #4   Title Pt will have imrpoved standing and walking tolerance by 50% or > to demo decreased pain in order for pt to perform ADLs and IADLs with greater ease.  Time 4   Period Weeks   Status New                Plan - 07/10/17 1207    Clinical Impression Statement Pt is pleasant 60 YO M who presents to OPPT with c/o chronic R foot pain. Pt fell out of a ladder in December 2015 and broke both ankles, with the R being a compound fracture. He reports having approximately 5 surgeries on his R ankle in January 27, 2015 as he had hardware placed and removed for various reasons; pt currently does not have any hardware placed for his R ankle. Pt recently discharged from therapy for LBP s/p surgery which improved significantly. He  presents with deficits in AROM, balance, gait, and functional strength as well as increased pain, soft tissue restrictions and joint hypomobility, all of which are impairing his ability to perform IADLs. Pt needs skilled PT intervention to address these impairments in order to decrease pain and maximize QOL.    History and Personal Factors relevant to plan of care: chronicity of issue/pain, h/o lumbar surgery   Clinical Presentation Stable   Clinical Presentation due to: AROM, balance, gait, 3MWT, pain   Clinical Decision Making Low   Rehab Potential Fair   PT Frequency 2x / week   PT Duration 4 weeks   PT Treatment/Interventions ADLs/Self Care Home Management;Cryotherapy;Electrical Stimulation;Moist Heat;Gait training;Stair training;Functional mobility training;Therapeutic activities;Therapeutic exercise;Balance training;Neuromuscular re-education;Patient/family education;Manual techniques;Passive range of motion;Scar mobilization;Dry needling;Energy conservation;Splinting;Taping   PT Next Visit Plan review goals and HEP, BAPs for range, joint mobs in all directions, STM for pain and soft tissue restrictions of ankle, functional strengthening, ankle stability and balance activities   PT Home Exercise Plan eval: standing calf stretch on step, ABCs   Consulted and Agree with Plan of Care Patient      Patient will benefit from skilled therapeutic intervention in order to improve the following deficits and impairments:  Abnormal gait, Decreased activity tolerance, Decreased balance, Decreased mobility, Decreased range of motion, Difficulty walking, Hypomobility, Increased muscle spasms, Increased fascial restricitons, Improper body mechanics, Pain  Visit Diagnosis: Pain in right ankle and joints of right foot - Plan: PT plan of care cert/re-cert  Stiffness of right ankle, not elsewhere classified - Plan: PT plan of care cert/re-cert  Difficulty in walking, not elsewhere classified - Plan: PT plan  of care cert/re-cert  Other symptoms and signs involving the musculoskeletal system - Plan: PT plan of care cert/re-cert      G-Codes - 32/44/01 January 26, 1217    Functional Assessment Tool Used (Outpatient Only) FOTO, clinical judgement, 3MWT, SLS, AROM   Functional Limitation Mobility: Walking and moving around   Mobility: Walking and Moving Around Current Status [phone]) At least 40 percent but less than 60 percent impaired, limited or restricted   Mobility: Walking and Moving Around Goal Status [phone]) At least 20 percent but less than 40 percent impaired, limited or restricted       Problem List Patient Active Problem List   Diagnosis Date Noted  . DDD (degenerative disc disease), lumbar 04/05/2017  . Fracture of L2 vertebra ([place]) 01/12/2015  . Acute osteomyelitis of calcaneum ([place]) 10/09/2014  . Dysuria 10/05/2014  . Cellulitis 10/05/2014  . Fall from ladder 09/21/2014  . L2 vertebral fracture ([place]) 09/21/2014  . Bilateral calcaneal fractures 09/21/2014  . Chronic pain 09/21/2014  . Acute blood loss anemia 09/21/2014  . Open right calcaneal fracture 09/15/2014        Geraldine Solar PT, DPT  [facility]  [place], [place], [address] Phone: [phone]   Fax:  [phone]  Name: DARIEN KADING MRN: [id number] Date of Birth: 1955-11-07

## 2017-07-10 NOTE — Patient Instructions (Signed)
  Calf Stretch on a Stair  Standing on a stair so that the edge of the stair hits the midfoot, let one foot drop down below the stair so that a stretch is felt in the back of the lower leg. Hold and repeat on the opposite side.  Perform 2x/day, 3-5 stretches holding for 30-60 seconds   ANKLE ABC's   While in a seated position, write out the alphabet in the air with your big toe.  Your ankle should be moving as you perform this.  Perform 1-2x/day, 2-3 rounds through the alphabet

## 2017-07-11 ENCOUNTER — Ambulatory Visit (HOSPITAL_COMMUNITY): Payer: PPO

## 2017-07-11 ENCOUNTER — Encounter (HOSPITAL_COMMUNITY): Payer: Self-pay

## 2017-07-11 DIAGNOSIS — M545 Low back pain: Secondary | ICD-10-CM | POA: Diagnosis not present

## 2017-07-11 DIAGNOSIS — M25571 Pain in right ankle and joints of right foot: Secondary | ICD-10-CM

## 2017-07-11 DIAGNOSIS — R29898 Other symptoms and signs involving the musculoskeletal system: Secondary | ICD-10-CM

## 2017-07-11 DIAGNOSIS — M25671 Stiffness of right ankle, not elsewhere classified: Secondary | ICD-10-CM

## 2017-07-11 DIAGNOSIS — R262 Difficulty in walking, not elsewhere classified: Secondary | ICD-10-CM

## 2017-07-11 NOTE — Therapy (Signed)
[facility] [place], [place], [address] Phone: [phone]   Fax:  [phone]  Physical Therapy Treatment  Patient Details  Name: Bradley Espinoza MRN: [id number] Date of Birth: 02/06/1956 Referring Provider: Celene Squibb, MD  Encounter Date: 07/11/2017      PT End of Session - 07/11/17 1519    Visit Number 2   Number of Visits 9   Date for PT Re-Evaluation 08/07/17   Authorization Type [employer]   Authorization Time Period 07/10/17 to 08/07/17   Authorization - Visit Number 2   Authorization - Number of Visits 10   PT Start Time 1519   PT Stop Time 1602   PT Time Calculation (min) 43 min   Activity Tolerance Patient tolerated treatment well   Behavior During Therapy [facility] for tasks assessed/performed      Past Medical History:  Diagnosis Date  . Anxiety   . Arthritis    low back pain, lumbar radiculopathy  . Depression   . Fracture    B/L ankles  . GERD (gastroesophageal reflux disease)   . Headache(784.0)    allergy related   . History of kidney stones   . History of stomach ulcers   . Retained orthopedic hardware    failed retained hardware right foot  . Wears glasses     Past Surgical History:  Procedure Laterality Date  . ANKLE FUSION Right 05/11/2015   Procedure: Right Posterior Arthroscopic Subtalar Arthrodesis;  Surgeon: Newt Minion, MD;  Location: [place];  Service: Orthopedics;  Laterality: Right;  . ANTERIOR LAT LUMBAR FUSION N/A 04/05/2017   Procedure: Extreme Lateral Interbody Fusion - Lumbar three-lumbar four ,exploration of fusion Posterior augmentation with globus addition Removal hardware Lumbar one-three. Lumbar four-sacral one,  Removal internal bone growth stimulator;  Surgeon: Kary Kos, MD;  Location: [place];  Service: Neurosurgery;  Laterality: N/A;  . BACK SURGERY  2004   x 2  . CERVICAL SPINE SURGERY  2008  . ESOPHAGOGASTRODUODENOSCOPY    . HARDWARE REMOVAL Right  10/09/2014   Procedure: Removal Deep Hardware, Irrigation and Debridement Calcaneus, Place Antibiotic Beads and Wound VAC ;  Surgeon: Newt Minion, MD;  Location: [place];  Service: Orthopedics;  Laterality: Right;  . HARDWARE REMOVAL Right 08/12/2015   Procedure: Removal Deep Hardware Right Foot;  Surgeon: Newt Minion, MD;  Location: [place];  Service: Orthopedics;  Laterality: Right;  . I&D EXTREMITY Right 09/15/2014   Procedure: IRRIGATION AND DEBRIDEMENT Ankle;  Surgeon: Renette Butters, MD;  Location: [place];  Service: Orthopedics;  Laterality: Right;  . INGUINAL HERNIA REPAIR Bilateral   . LUMBAR PERCUTANEOUS PEDICLE SCREW 1 LEVEL N/A 04/05/2017   Procedure: LUMBAR PERCUTANEOUS PEDICLE SCREW LUMBAR THREE-FOUR;  Surgeon: Kary Kos, MD;  Location: [place];  Service: Neurosurgery;  Laterality: N/A;  . ORIF CALCANEOUS FRACTURE Right 09/19/2014   Procedure: OPEN REDUCTION INTERNAL FIXATION (ORIF) CALCANEOUS FRACTURE;  Surgeon: Newt Minion, MD;  Location: [place];  Service: Orthopedics;  Laterality: Right;  . ORIF CALCANEOUS FRACTURE Left 09/19/2014   Procedure: OPEN REDUCTION INTERNAL FIXATION (ORIF) CALCANEOUS FRACTURE;  Surgeon: Newt Minion, MD;  Location: [facility];  Service: Orthopedics;  Laterality: Left;    There were no vitals filed for this visit.      Subjective Assessment - 07/11/17 1522    Subjective Pt states that his ankle is burning like crazy today. He states he has been on them all day.  Pertinent History history of B calcaneal fratures and lumbar fractures after fall off a ladder; history of mulitple surgeries for feet and lumbar spine    Limitations Standing;Walking;House hold activities   How long can you sit comfortably? unlimited   How long can you stand comfortably? 30 mins   How long can you walk comfortably? 30 mins or less   Patient Stated Goals walk and stand longer with no pain   Currently in Pain? Yes   Pain Score 10-Worst pain ever   Pain Location Ankle   Pain  Orientation Right   Pain Descriptors / Indicators Burning;Throbbing   Pain Type Chronic pain   Pain Onset More than a month ago   Pain Frequency Constant   Aggravating Factors  walking and standing   Pain Relieving Factors pain meds, icy-hot   Effect of Pain on Daily Activities increases            [facility] PT Assessment - 07/11/17 0001      AROM   Overall AROM Comments completed at EOS following therex and manual   Right Ankle Dorsiflexion 0   Right Ankle Plantar Flexion 30   Right Ankle Inversion 15   Right Ankle Eversion 8         [facility] Adult PT Treatment/Exercise - 07/11/17 0001      Exercises   Exercises Ankle     Manual Therapy   Manual Therapy Joint mobilization;Soft tissue mobilization   Manual therapy comments completed separate rest of treatment   Joint Mobilization AP and PA talocrural joint mobs and medial and lateral subtalar joint mobs, 10-15 bouts x 10" each   Soft tissue mobilization STM to dorsal, plantar, medial and lateral aspects of R ankle     Ankle Exercises: Seated   BAPS Sitting;Level 2   BAPS Limitations DF, PF, inv, ev x20 reps; CW, CCW x10 each               PT Education - 07/11/17 1612    Education provided Yes   Education Details reviewed goals and HEP with no f/u questions; continue current HEP   Person(s) Educated Patient   Methods Explanation;Demonstration   Comprehension Verbalized understanding;Returned demonstration          PT Short Term Goals - 07/10/17 1211      PT SHORT TERM GOAL #1   Title Pt will be independent with HEP and perform consistently to decrease pain and maximize overall function.    Time 2   Period Weeks   Status New   Target Date 07/24/17     PT SHORT TERM GOAL #2   Title Pt will have improved R ankle AROM by at least 5 deg to decrease pain and maximize gait.   Time 2   Period Weeks   Status New           PT Long Term Goals - 07/10/17 1212      PT LONG TERM GOAL #1   Title Pt will  have improved R ankle DF and PF AROM by at least 10 deg in order to decrease pain, maximize gait and balance.    Time 4   Period Weeks   Status New   Target Date 08/07/17     PT LONG TERM GOAL #2   Title Pt will have improved 3MWT to at least 728ft with R foot pain no more than 4/10 to demo improved overall function and maximize community access.    Time 4  Period Weeks   Status New     PT LONG TERM GOAL #3   Title Pt will be able to perform R SLS for at least 10 sec to demo improved overall stability and functional strength of the R ankle in order to maximize gait.    Time 4   Period Weeks   Status New     PT LONG TERM GOAL #4   Title Pt will have imrpoved standing and walking tolerance by 50% or > to demo decreased pain in order for pt to perform ADLs and IADLs with greater ease.    Time 4   Period Weeks   Status New               Plan - 07/11/17 1613    Clinical Impression Statement Reviewed goals and HEP with no f/u questions. BAPS in sitting introduced this date; he had significant diffiulty with L3, so decreased to L2. Rest of session focused on manual techniques to manage ROM deficits. Pt's DF, inv, and ev all improved at EOS this date as illustrated above. He is still significantly impaired in PF. He reported decreased pain during amb following session.   Rehab Potential Fair   PT Frequency 2x / week   PT Duration 4 weeks   PT Treatment/Interventions ADLs/Self Care Home Management;Cryotherapy;Electrical Stimulation;Moist Heat;Gait training;Stair training;Functional mobility training;Therapeutic activities;Therapeutic exercise;Balance training;Neuromuscular re-education;Patient/family education;Manual techniques;Passive range of motion;Scar mobilization;Dry needling;Energy conservation;Splinting;Taping   PT Next Visit Plan continue BAPs for range, joint mobs in all directions, STM for pain and soft tissue restrictions of ankle, functional strengthening, ankle stability  and balance activities   PT Home Exercise Plan eval: standing calf stretch on step, ABCs   Consulted and Agree with Plan of Care Patient      Patient will benefit from skilled therapeutic intervention in order to improve the following deficits and impairments:  Abnormal gait, Decreased activity tolerance, Decreased balance, Decreased mobility, Decreased range of motion, Difficulty walking, Hypomobility, Increased muscle spasms, Increased fascial restricitons, Improper body mechanics, Pain  Visit Diagnosis: Pain in right ankle and joints of right foot  Stiffness of right ankle, not elsewhere classified  Difficulty in walking, not elsewhere classified  Other symptoms and signs involving the musculoskeletal system       G-Codes - 07/11/17 1217/01/04    Functional Assessment Tool Used (Outpatient Only) FOTO, clinical judgement, 3MWT, SLS, AROM   Functional Limitation Mobility: Walking and moving around   Mobility: Walking and Moving Around Current Status [phone]) At least 40 percent but less than 60 percent impaired, limited or restricted   Mobility: Walking and Moving Around Goal Status [phone]) At least 20 percent but less than 40 percent impaired, limited or restricted      Problem List Patient Active Problem List   Diagnosis Date Noted  . DDD (degenerative disc disease), lumbar 04/05/2017  . Fracture of L2 vertebra ([place]) 01/12/2015  . Acute osteomyelitis of calcaneum ([place]) 10/09/2014  . Dysuria 10/05/2014  . Cellulitis 10/05/2014  . Fall from ladder 09/21/2014  . L2 vertebral fracture ([place]) 09/21/2014  . Bilateral calcaneal fractures 09/21/2014  . Chronic pain 09/21/2014  . Acute blood loss anemia 09/21/2014  . Open right calcaneal fracture 09/15/2014        Geraldine Solar PT, DPT  [facility] [address] [place], [place], [address] Phone: [phone]   Fax:  [phone]  Name: Bradley Espinoza MRN: [id number] Date of  Birth: 10/27/1955

## 2017-07-16 ENCOUNTER — Encounter (HOSPITAL_COMMUNITY): Payer: Self-pay

## 2017-07-16 ENCOUNTER — Ambulatory Visit (HOSPITAL_COMMUNITY): Payer: PPO

## 2017-07-16 DIAGNOSIS — M25571 Pain in right ankle and joints of right foot: Secondary | ICD-10-CM

## 2017-07-16 DIAGNOSIS — Z125 Encounter for screening for malignant neoplasm of prostate: Secondary | ICD-10-CM | POA: Diagnosis not present

## 2017-07-16 DIAGNOSIS — M25671 Stiffness of right ankle, not elsewhere classified: Secondary | ICD-10-CM

## 2017-07-16 DIAGNOSIS — M545 Low back pain: Secondary | ICD-10-CM | POA: Diagnosis not present

## 2017-07-16 DIAGNOSIS — Z0000 Encounter for general adult medical examination without abnormal findings: Secondary | ICD-10-CM | POA: Diagnosis not present

## 2017-07-16 DIAGNOSIS — R29898 Other symptoms and signs involving the musculoskeletal system: Secondary | ICD-10-CM

## 2017-07-16 DIAGNOSIS — R262 Difficulty in walking, not elsewhere classified: Secondary | ICD-10-CM

## 2017-07-16 NOTE — Therapy (Signed)
[facility] [address] [place], [place], [address] Phone: [phone]   Fax:  [phone]  Physical Therapy Treatment  Patient Details  Name: Bradley Espinoza MRN: [id number] Date of Birth: May 31, 1956 Referring Provider: Celene Squibb, MD  Encounter Date: 07/16/2017      PT End of Session - 07/16/17 1521    Visit Number 3   Number of Visits 9   Date for PT Re-Evaluation 08/07/17   Authorization Type [employer]   Authorization Time Period 07/10/17 to 08/07/17   Authorization - Visit Number 3   Authorization - Number of Visits 10   PT Start Time 7253   PT Stop Time 1520   PT Time Calculation (min) 45 min   Activity Tolerance Patient tolerated treatment well   Behavior During Therapy [facility] for tasks assessed/performed      Past Medical History:  Diagnosis Date  . Anxiety   . Arthritis    low back pain, lumbar radiculopathy  . Depression   . Fracture    B/L ankles  . GERD (gastroesophageal reflux disease)   . Headache(784.0)    allergy related   . History of kidney stones   . History of stomach ulcers   . Retained orthopedic hardware    failed retained hardware right foot  . Wears glasses     Past Surgical History:  Procedure Laterality Date  . ANKLE FUSION Right 05/11/2015   Procedure: Right Posterior Arthroscopic Subtalar Arthrodesis;  Surgeon: Newt Minion, MD;  Location: [facility];  Service: Orthopedics;  Laterality: Right;  . ANTERIOR LAT LUMBAR FUSION N/A 04/05/2017   Procedure: Extreme Lateral Interbody Fusion - Lumbar three-lumbar four ,exploration of fusion Posterior augmentation with globus addition Removal hardware Lumbar one-three. Lumbar four-sacral one,  Removal internal bone growth stimulator;  Surgeon: Kary Kos, MD;  Location: [facility];  Service: Neurosurgery;  Laterality: N/A;  . BACK SURGERY  2004   x 2  . CERVICAL SPINE SURGERY  2008  . ESOPHAGOGASTRODUODENOSCOPY    . HARDWARE REMOVAL Right  10/09/2014   Procedure: Removal Deep Hardware, Irrigation and Debridement Calcaneus, Place Antibiotic Beads and Wound VAC ;  Surgeon: Newt Minion, MD;  Location: [facility];  Service: Orthopedics;  Laterality: Right;  . HARDWARE REMOVAL Right 08/12/2015   Procedure: Removal Deep Hardware Right Foot;  Surgeon: Newt Minion, MD;  Location: [facility];  Service: Orthopedics;  Laterality: Right;  . I&D EXTREMITY Right 09/15/2014   Procedure: IRRIGATION AND DEBRIDEMENT Ankle;  Surgeon: Renette Butters, MD;  Location: [facility];  Service: Orthopedics;  Laterality: Right;  . INGUINAL HERNIA REPAIR Bilateral   . LUMBAR PERCUTANEOUS PEDICLE SCREW 1 LEVEL N/A 04/05/2017   Procedure: LUMBAR PERCUTANEOUS PEDICLE SCREW LUMBAR THREE-FOUR;  Surgeon: Kary Kos, MD;  Location: [facility];  Service: Neurosurgery;  Laterality: N/A;  . ORIF CALCANEOUS FRACTURE Right 09/19/2014   Procedure: OPEN REDUCTION INTERNAL FIXATION (ORIF) CALCANEOUS FRACTURE;  Surgeon: Newt Minion, MD;  Location: [facility];  Service: Orthopedics;  Laterality: Right;  . ORIF CALCANEOUS FRACTURE Left 09/19/2014   Procedure: OPEN REDUCTION INTERNAL FIXATION (ORIF) CALCANEOUS FRACTURE;  Surgeon: Newt Minion, MD;  Location: [facility];  Service: Orthopedics;  Laterality: Left;    There were no vitals filed for this visit.      Subjective Assessment - 07/16/17 1439    Subjective Pt notes that hes ankle is throbbing like normal but he also hasn't been on it all day today. He  states he hasn't done much today.    Currently in Pain? Yes   Pain Score 7    Pain Location Ankle   Pain Orientation Right   Pain Descriptors / Indicators Burning;Throbbing                         [facility] Adult PT Treatment/Exercise - 07/16/17 0001      Exercises   Exercises Ankle     Manual Therapy   Manual Therapy Joint mobilization;Soft tissue mobilization   Manual therapy comments completed separate rest of treatment   Joint Mobilization AP and PA talocrural joint  mobs and medial and lateral subtalar joint mobs, 10-15 bouts x 10" each; calcaneal lateral and medial mobility    Soft tissue mobilization STM to dorsal, plantar, medial and lateral aspects of R ankle     Ankle Exercises: Seated   Heel Raises 20 reps   Heel Raises Limitations limited range secondary to talocrural    BAPS Sitting;Level 2   BAPS Limitations DF, PF, inv, ev x20 reps; CW, CCW x10 each     Ankle Exercises: Supine   Isometrics 10 reps x 3 sec hold, manual; DF, Inv, Ev                  PT Short Term Goals - 07/10/17 1211      PT SHORT TERM GOAL #1   Title Pt will be independent with HEP and perform consistently to decrease pain and maximize overall function.    Time 2   Period Weeks   Status New   Target Date 07/24/17     PT SHORT TERM GOAL #2   Title Pt will have improved R ankle AROM by at least 5 deg to decrease pain and maximize gait.   Time 2   Period Weeks   Status New           PT Long Term Goals - 07/10/17 1212      PT LONG TERM GOAL #1   Title Pt will have improved R ankle DF and PF AROM by at least 10 deg in order to decrease pain, maximize gait and balance.    Time 4   Period Weeks   Status New   Target Date 08/07/17     PT LONG TERM GOAL #2   Title Pt will have improved 3MWT to at least 723ft with R foot pain no more than 4/10 to demo improved overall function and maximize community access.    Time 4   Period Weeks   Status New     PT LONG TERM GOAL #3   Title Pt will be able to perform R SLS for at least 10 sec to demo improved overall stability and functional strength of the R ankle in order to maximize gait.    Time 4   Period Weeks   Status New     PT LONG TERM GOAL #4   Title Pt will have imrpoved standing and walking tolerance by 50% or > to demo decreased pain in order for pt to perform ADLs and IADLs with greater ease.    Time 4   Period Weeks   Status New               Plan - 07/16/17 1521    Clinical  Impression Statement Today's session focused on joint mobility, pain management and strengthening. He continues to demonstrate decreased proprioception with Rt. Ankle exercises with cues  to avoid utilizing knee to gain motion and decreased smoothness of circles using BAPs board. Manual patient noted peroneal soreness at joint line. Patient noted that posterior is where he is feeling most of his pain. Significant joint limitations felt with talocrural mobility and calcaneal mobility.    Rehab Potential Fair   PT Frequency 2x / week   PT Duration 4 weeks   PT Treatment/Interventions ADLs/Self Care Home Management;Cryotherapy;Electrical Stimulation;Moist Heat;Gait training;Stair training;Functional mobility training;Therapeutic activities;Therapeutic exercise;Balance training;Neuromuscular re-education;Patient/family education;Manual techniques;Passive range of motion;Scar mobilization;Dry needling;Energy conservation;Splinting;Taping   PT Next Visit Plan continue BAPs for range, joint mobs in all directions, STM for pain and soft tissue restrictions of ankle, functional strengthening, ankle stability and balance activities   PT Home Exercise Plan eval: standing calf stretch on step, ABCs   Consulted and Agree with Plan of Care Patient      Patient will benefit from skilled therapeutic intervention in order to improve the following deficits and impairments:  Abnormal gait, Decreased activity tolerance, Decreased balance, Decreased mobility, Decreased range of motion, Difficulty walking, Hypomobility, Increased muscle spasms, Increased fascial restricitons, Improper body mechanics, Pain  Visit Diagnosis: Pain in right ankle and joints of right foot  Stiffness of right ankle, not elsewhere classified  Difficulty in walking, not elsewhere classified  Other symptoms and signs involving the musculoskeletal system     Problem List Patient Active Problem List   Diagnosis Date Noted  . DDD  (degenerative disc disease), lumbar 04/05/2017  . Fracture of L2 vertebra ([facility]) 01/12/2015  . Acute osteomyelitis of calcaneum ([facility]) 10/09/2014  . Dysuria 10/05/2014  . Cellulitis 10/05/2014  . Fall from ladder 09/21/2014  . L2 vertebral fracture ([place]) 09/21/2014  . Bilateral calcaneal fractures 09/21/2014  . Chronic pain 09/21/2014  . Acute blood loss anemia 09/21/2014  . Open right calcaneal fracture 09/15/2014   Starr Lake PT, DPT 3:23 PM, 07/16/17 [place], [place], [address] Phone: [phone]   Fax:  [phone]  Name: Bradley Espinoza MRN: [id number] Date of Birth: Sep 01, 1956

## 2017-07-18 ENCOUNTER — Ambulatory Visit (HOSPITAL_COMMUNITY): Payer: PPO

## 2017-07-18 ENCOUNTER — Encounter (HOSPITAL_COMMUNITY): Payer: Self-pay

## 2017-07-18 DIAGNOSIS — M25571 Pain in right ankle and joints of right foot: Secondary | ICD-10-CM

## 2017-07-18 DIAGNOSIS — M25671 Stiffness of right ankle, not elsewhere classified: Secondary | ICD-10-CM

## 2017-07-18 DIAGNOSIS — M545 Low back pain: Secondary | ICD-10-CM | POA: Diagnosis not present

## 2017-07-18 DIAGNOSIS — R262 Difficulty in walking, not elsewhere classified: Secondary | ICD-10-CM

## 2017-07-18 DIAGNOSIS — R29898 Other symptoms and signs involving the musculoskeletal system: Secondary | ICD-10-CM

## 2017-07-18 NOTE — Patient Instructions (Addendum)
  STANDING CALF STRETCH  - GASTROC  Start by standing in front of a wall or other sturdy object. Step forward with one foot and maintain your toes on both feet to be pointed straight forward. Keep the leg behind you with a straight knee during the stretch.   Lean forward towards the wall and support yourself with your arms as you allow your front knee to bend until a gentle stretch is felt along the back of your leg that is most behind you.   Move closer or further away from the wall to control the stretch of the back leg. Also you can adjust the bend of the front knee to control the stretch as well.   Perform 2x/day, 3-5 stretches holding for 30-60 seconds.

## 2017-07-18 NOTE — Therapy (Signed)
[facility] [address] [place], [place], [address] Phone: [phone]   Fax:  [phone]  Physical Therapy Treatment  Patient Details  Name: Bradley Espinoza MRN: [id number] Date of Birth: [birth year] Referring Provider: Celene Squibb, MD  Encounter Date: 07/18/2017      PT End of Session - 07/18/17 1034    Visit Number 4   Number of Visits 9   Date for PT Re-Evaluation 08/07/17   Authorization Type [employer]   Authorization Time Period 07/10/17 to 08/07/17   Authorization - Visit Number 4   Authorization - Number of Visits 10   PT Start Time 1030   PT Stop Time 1113   PT Time Calculation (min) 43 min   Activity Tolerance Patient tolerated treatment well   Behavior During Therapy [facility] for tasks assessed/performed      Past Medical History:  Diagnosis Date  . Anxiety   . Arthritis    low back pain, lumbar radiculopathy  . Depression   . Fracture    B/L ankles  . GERD (gastroesophageal reflux disease)   . Headache(784.0)    allergy related   . History of kidney stones   . History of stomach ulcers   . Retained orthopedic hardware    failed retained hardware right foot  . Wears glasses     Past Surgical History:  Procedure Laterality Date  . ANKLE FUSION Right 05/11/2015   Procedure: Right Posterior Arthroscopic Subtalar Arthrodesis;  Surgeon: Newt Minion, MD;  Location: [facility];  Service: Orthopedics;  Laterality: Right;  . ANTERIOR LAT LUMBAR FUSION N/A 04/05/2017   Procedure: Extreme Lateral Interbody Fusion - Lumbar three-lumbar four ,exploration of fusion Posterior augmentation with globus addition Removal hardware Lumbar one-three. Lumbar four-sacral one,  Removal internal bone growth stimulator;  Surgeon: Kary Kos, MD;  Location: [facility] [place];  Service: Neurosurgery;  Laterality: N/A;  . BACK SURGERY  2004   x 2  . CERVICAL SPINE SURGERY  2008  . ESOPHAGOGASTRODUODENOSCOPY    . HARDWARE REMOVAL Right  10/09/2014   Procedure: Removal Deep Hardware, Irrigation and Debridement Calcaneus, Place Antibiotic Beads and Wound VAC ;  Surgeon: Newt Minion, MD;  Location: [facility];  Service: Orthopedics;  Laterality: Right;  . HARDWARE REMOVAL Right 08/12/2015   Procedure: Removal Deep Hardware Right Foot;  Surgeon: Newt Minion, MD;  Location: [facility];  Service: Orthopedics;  Laterality: Right;  . I&D EXTREMITY Right 09/15/2014   Procedure: IRRIGATION AND DEBRIDEMENT Ankle;  Surgeon: Renette Butters, MD;  Location: [facility];  Service: Orthopedics;  Laterality: Right;  . INGUINAL HERNIA REPAIR Bilateral   . LUMBAR PERCUTANEOUS PEDICLE SCREW 1 LEVEL N/A 04/05/2017   Procedure: LUMBAR PERCUTANEOUS PEDICLE SCREW LUMBAR THREE-FOUR;  Surgeon: Kary Kos, MD;  Location: [facility];  Service: Neurosurgery;  Laterality: N/A;  . ORIF CALCANEOUS FRACTURE Right 09/19/2014   Procedure: OPEN REDUCTION INTERNAL FIXATION (ORIF) CALCANEOUS FRACTURE;  Surgeon: Newt Minion, MD;  Location: [facility];  Service: Orthopedics;  Laterality: Right;  . ORIF CALCANEOUS FRACTURE Left 09/19/2014   Procedure: OPEN REDUCTION INTERNAL FIXATION (ORIF) CALCANEOUS FRACTURE;  Surgeon: Newt Minion, MD;  Location: [facility];  Service: Orthopedics;  Laterality: Left;    There were no vitals filed for this visit.      Subjective Assessment - 07/18/17 1032    Subjective Pt reports that his ankle is feeling better overall. He is in 5/10 pain currently but states that he hasn't  been on his feet a lot this morning.    Currently in Pain? Yes   Pain Score 5    Pain Location Ankle   Pain Orientation Right   Pain Descriptors / Indicators Pressure   Pain Type Chronic pain   Pain Onset More than a month ago   Pain Frequency Constant   Aggravating Factors  walking and standing   Pain Relieving Factors pain meds, icy-hot   Effect of Pain on Daily Activities increases              [facility] Adult PT Treatment/Exercise - 07/18/17 0001      Manual Therapy    Manual Therapy Joint mobilization;Soft tissue mobilization   Manual therapy comments completed separate rest of treatment   Joint Mobilization AP and PA talocrural joint mobs and medial and lateral subtalar joint mobs, 10-15 bouts x 10" each; calcaneal lateral and medial mobility    Soft tissue mobilization STM to dorsal, plantar, medial and lateral aspects of R ankle     Ankle Exercises: Aerobic   Stationary Bike L0, seat 12, x4 mins for ROM     Ankle Exercises: Stretches   Slant Board Stretch 3 reps;30 seconds     Ankle Exercises: Standing   BAPS Standing;Level 2  DF, PF, inv, ev, CW, CCW x 10 each   Rocker Board 2 minutes  AP and lateral x2 mins each   Heel Raises 20 reps   Toe Raise 20 reps              PT Education - 07/18/17 1038    Education provided Yes   Education Details added standing calf stretch to HEP   Person(s) Educated Patient   Methods Explanation;Demonstration;Handout   Comprehension Verbalized understanding;Returned demonstration          PT Short Term Goals - 07/10/17 1211      PT SHORT TERM GOAL #1   Title Pt will be independent with HEP and perform consistently to decrease pain and maximize overall function.    Time 2   Period Weeks   Status New   Target Date 07/24/17     PT SHORT TERM GOAL #2   Title Pt will have improved R ankle AROM by at least 5 deg to decrease pain and maximize gait.   Time 2   Period Weeks   Status New           PT Long Term Goals - 07/10/17 1212      PT LONG TERM GOAL #1   Title Pt will have improved R ankle DF and PF AROM by at least 10 deg in order to decrease pain, maximize gait and balance.    Time 4   Period Weeks   Status New   Target Date 08/07/17     PT LONG TERM GOAL #2   Title Pt will have improved 3MWT to at least 763ft with R foot pain no more than 4/10 to demo improved overall function and maximize community access.    Time 4   Period Weeks   Status New     PT LONG TERM GOAL #3    Title Pt will be able to perform R SLS for at least 10 sec to demo improved overall stability and functional strength of the R ankle in order to maximize gait.    Time 4   Period Weeks   Status New     PT LONG TERM GOAL #4   Title Pt will  have imrpoved standing and walking tolerance by 50% or > to demo decreased pain in order for pt to perform ADLs and IADLs with greater ease.    Time 4   Period Weeks   Status New               Plan - 07/18/17 1115    Clinical Impression Statement Session continued to focus on improving ROM, strength, and stability of ankle. Pt progressed to more standing ROM activities this date; he reported increased soreness following standing BAPS. Ended session with joint mobs and STM and pt stated his pain decreased to a 4/10. Overall he is making good progress towards his goals.   Rehab Potential Fair   PT Frequency 2x / week   PT Duration 4 weeks   PT Treatment/Interventions ADLs/Self Care Home Management;Cryotherapy;Electrical Stimulation;Moist Heat;Gait training;Stair training;Functional mobility training;Therapeutic activities;Therapeutic exercise;Balance training;Neuromuscular re-education;Patient/family education;Manual techniques;Passive range of motion;Scar mobilization;Dry needling;Energy conservation;Splinting;Taping   PT Next Visit Plan continue BAPs for range in standing, joint mobs in all directions, STM for pain and soft tissue restrictions of ankle, functional strengthening, ankle stability and balance activities   PT Home Exercise Plan eval: standing calf stretch on step, ABCs; 10/11: standing calf stretch against wall   Consulted and Agree with Plan of Care Patient      Patient will benefit from skilled therapeutic intervention in order to improve the following deficits and impairments:  Abnormal gait, Decreased activity tolerance, Decreased balance, Decreased mobility, Decreased range of motion, Difficulty walking, Hypomobility, Increased  muscle spasms, Increased fascial restricitons, Improper body mechanics, Pain  Visit Diagnosis: Pain in right ankle and joints of right foot  Stiffness of right ankle, not elsewhere classified  Difficulty in walking, not elsewhere classified  Other symptoms and signs involving the musculoskeletal system     Problem List Patient Active Problem List   Diagnosis Date Noted  . DDD (degenerative disc disease), lumbar 04/05/2017  . Fracture of L2 vertebra ([place]) 01/12/2015  . Acute osteomyelitis of calcaneum ([place]) 10/09/2014  . Dysuria 10/05/2014  . Cellulitis 10/05/2014  . Fall from ladder 09/21/2014  . L2 vertebral fracture ([place]) 09/21/2014  . Bilateral calcaneal fractures 09/21/2014  . Chronic pain 09/21/2014  . Acute blood loss anemia 09/21/2014  . Open right calcaneal fracture 09/15/2014       Geraldine Solar PT, DPT  [facility] [address] [place], [place], [address] Phone: [phone]   Fax:  [phone]  Name: Bradley Espinoza MRN: [id number] Date of Birth: [birth year]

## 2017-07-23 ENCOUNTER — Ambulatory Visit (HOSPITAL_COMMUNITY): Payer: PPO

## 2017-07-23 ENCOUNTER — Encounter (HOSPITAL_COMMUNITY): Payer: Self-pay

## 2017-07-23 DIAGNOSIS — M25571 Pain in right ankle and joints of right foot: Secondary | ICD-10-CM

## 2017-07-23 DIAGNOSIS — M25671 Stiffness of right ankle, not elsewhere classified: Secondary | ICD-10-CM

## 2017-07-23 DIAGNOSIS — R262 Difficulty in walking, not elsewhere classified: Secondary | ICD-10-CM

## 2017-07-23 DIAGNOSIS — M545 Low back pain: Secondary | ICD-10-CM | POA: Diagnosis not present

## 2017-07-23 NOTE — Therapy (Signed)
[place], [place], [address] Phone: [phone]   Fax:  [phone]  Physical Therapy Treatment  Patient Details  Name: Bradley Espinoza MRN: [id number] Date of Birth: 04-Jan-1956 Referring Provider: Celene Squibb, MD  Encounter Date: 07/23/2017      PT End of Session - 07/23/17 1355    Visit Number 5   Number of Visits 9   Date for PT Re-Evaluation 08/07/17   Authorization Type [employer]   Authorization Time Period 07/10/17 to 08/07/17   Authorization - Visit Number 5   Authorization - Number of Visits 10   PT Start Time 0347  first 4 min on Nustep for ROM (not included in billing)   PT Stop Time 1430   PT Time Calculation (min) 42 min   Activity Tolerance Patient tolerated treatment well;No increased pain   Behavior During Therapy WFL for tasks assessed/performed      Past Medical History:  Diagnosis Date  . Anxiety   . Arthritis    low back pain, lumbar radiculopathy  . Depression   . Fracture    B/L ankles  . GERD (gastroesophageal reflux disease)   . Headache(784.0)    allergy related   . History of kidney stones   . History of stomach ulcers   . Retained orthopedic hardware    failed retained hardware right foot  . Wears glasses     Past Surgical History:  Procedure Laterality Date  . ANKLE FUSION Right 05/11/2015   Procedure: Right Posterior Arthroscopic Subtalar Arthrodesis;  Surgeon: Newt Minion, MD;  Location: [place];  Service: Orthopedics;  Laterality: Right;  . ANTERIOR LAT LUMBAR FUSION N/A 04/05/2017   Procedure: Extreme Lateral Interbody Fusion - Lumbar three-lumbar four ,exploration of fusion Posterior augmentation with globus addition Removal hardware Lumbar one-three. Lumbar four-sacral one,  Removal internal bone growth stimulator;  Surgeon: Kary Kos, MD;  Location: [place];  Service: Neurosurgery;  Laterality: N/A;  . BACK SURGERY  2004   x 2  . CERVICAL SPINE  SURGERY  2008  . ESOPHAGOGASTRODUODENOSCOPY    . HARDWARE REMOVAL Right 10/09/2014   Procedure: Removal Deep Hardware, Irrigation and Debridement Calcaneus, Place Antibiotic Beads and Wound VAC ;  Surgeon: Newt Minion, MD;  Location: [place];  Service: Orthopedics;  Laterality: Right;  . HARDWARE REMOVAL Right 08/12/2015   Procedure: Removal Deep Hardware Right Foot;  Surgeon: Newt Minion, MD;  Location: [place];  Service: Orthopedics;  Laterality: Right;  . I&D EXTREMITY Right 09/15/2014   Procedure: IRRIGATION AND DEBRIDEMENT Ankle;  Surgeon: Renette Butters, MD;  Location: [facility] [place];  Service: Orthopedics;  Laterality: Right;  . INGUINAL HERNIA REPAIR Bilateral   . LUMBAR PERCUTANEOUS PEDICLE SCREW 1 LEVEL N/A 04/05/2017   Procedure: LUMBAR PERCUTANEOUS PEDICLE SCREW LUMBAR THREE-FOUR;  Surgeon: Kary Kos, MD;  Location: [place];  Service: Neurosurgery;  Laterality: N/A;  . ORIF CALCANEOUS FRACTURE Right 09/19/2014   Procedure: OPEN REDUCTION INTERNAL FIXATION (ORIF) CALCANEOUS FRACTURE;  Surgeon: Newt Minion, MD;  Location: [place];  Service: Orthopedics;  Laterality: Right;  . ORIF CALCANEOUS FRACTURE Left 09/19/2014   Procedure: OPEN REDUCTION INTERNAL FIXATION (ORIF) CALCANEOUS FRACTURE;  Surgeon: Newt Minion, MD;  Location: [place];  Service: Orthopedics;  Laterality: Left;    There were no vitals filed for this visit.      Subjective Assessment - 07/23/17 1349    Subjective Pt stated he feels his ankle  is doing better, does continue to c/o back pain with standing.     Pertinent History history of B calcaneal fratures and lumbar fractures after fall off a ladder; history of mulitple surgeries for feet and lumbar spine    Patient Stated Goals walk and stand longer with no pain   Currently in Pain? Yes   Pain Score 5    Pain Location Ankle   Pain Orientation Right   Pain Descriptors / Indicators Burning;Sharp   Pain Type Chronic pain   Pain Onset More than a month ago   Pain Frequency  Constant   Aggravating Factors  walking and standing   Pain Relieving Factors pain meds, icy-hot   Effect of Pain on Daily Activities increases                         [facility] Adult PT Treatment/Exercise - 07/23/17 0001      Manual Therapy   Manual Therapy Joint mobilization;Soft tissue mobilization   Manual therapy comments completed separate rest of treatment   Joint Mobilization AP and PA talocrural joint mobs and medial and lateral subtalar joint mobs, 10-15 bouts x 10" each; calcaneal lateral and medial mobility    Soft tissue mobilization STM to dorsal, plantar, medial and lateral aspects of R ankle     Ankle Exercises: Stretches   Slant Board Stretch 3 reps;30 seconds     Ankle Exercises: Standing   BAPS Standing;Level 3   SLS Lt 15", Rt 9" max of 2   Rocker Board 2 minutes   Heel Raises 20 reps   Toe Raise 20 reps     Ankle Exercises: Aerobic   Stationary Bike L0, seat 12, x4 mins for ROM                  PT Short Term Goals - 07/10/17 1211      PT SHORT TERM GOAL #1   Title Pt will be independent with HEP and perform consistently to decrease pain and maximize overall function.    Time 2   Period Weeks   Status New   Target Date 07/24/17     PT SHORT TERM GOAL #2   Title Pt will have improved R ankle AROM by at least 5 deg to decrease pain and maximize gait.   Time 2   Period Weeks   Status New           PT Long Term Goals - 07/10/17 1212      PT LONG TERM GOAL #1   Title Pt will have improved R ankle DF and PF AROM by at least 10 deg in order to decrease pain, maximize gait and balance.    Time 4   Period Weeks   Status New   Target Date 08/07/17     PT LONG TERM GOAL #2   Title Pt will have improved 3MWT to at least 785ft with R foot pain no more than 4/10 to demo improved overall function and maximize community access.    Time 4   Period Weeks   Status New     PT LONG TERM GOAL #3   Title Pt will be able to perform R  SLS for at least 10 sec to demo improved overall stability and functional strength of the R ankle in order to maximize gait.    Time 4   Period Weeks   Status New     PT LONG TERM GOAL #  4   Title Pt will have imrpoved standing and walking tolerance by 50% or > to demo decreased pain in order for pt to perform ADLs and IADLs with greater ease.    Time 4   Period Weeks   Status New               Plan - 07/23/17 1812    Clinical Impression Statement Continued session focus with ankle mobility, strength and stability of ankle.  Increased BAPS to L3 for ankle mobility and added SlS for stability.  Pt does continues to c/o increased pain with prolonged standing/walking.  EOS with manual joint mobs and STM with reports of pain reduced and improved gait mechanics at EOS.     Rehab Potential Fair   Clinical Impairments Affecting Rehab Potential (+) age, overall health status; (-) chronicity of pain, multiple surgeries, mixed outcomes of PT earlier this year, mixed motivation    PT Frequency 2x / week   PT Duration 4 weeks   PT Treatment/Interventions ADLs/Self Care Home Management;Cryotherapy;Electrical Stimulation;Moist Heat;Gait training;Stair training;Functional mobility training;Therapeutic activities;Therapeutic exercise;Balance training;Neuromuscular re-education;Patient/family education;Manual techniques;Passive range of motion;Scar mobilization;Dry needling;Energy conservation;Splinting;Taping   PT Next Visit Plan continue BAPs for range in standing, joint mobs in all directions, STM for pain and soft tissue restrictions of ankle, functional strengthening, ankle stability and balance activities   PT Home Exercise Plan eval: standing calf stretch on step, ABCs; 10/11: standing calf stretch against wall      Patient will benefit from skilled therapeutic intervention in order to improve the following deficits and impairments:  Abnormal gait, Decreased activity tolerance, Decreased  balance, Decreased mobility, Decreased range of motion, Difficulty walking, Hypomobility, Increased muscle spasms, Increased fascial restricitons, Improper body mechanics, Pain  Visit Diagnosis: Pain in right ankle and joints of right foot  Stiffness of right ankle, not elsewhere classified  Difficulty in walking, not elsewhere classified     Problem List Patient Active Problem List   Diagnosis Date Noted  . DDD (degenerative disc disease), lumbar 04/05/2017  . Fracture of L2 vertebra ([place]) 01/12/2015  . Acute osteomyelitis of calcaneum ([place]) 10/09/2014  . Dysuria 10/05/2014  . Cellulitis 10/05/2014  . Fall from ladder 09/21/2014  . L2 vertebral fracture ([facility]) 09/21/2014  . Bilateral calcaneal fractures 09/21/2014  . Chronic pain 09/21/2014  . Acute blood loss anemia 09/21/2014  . Open right calcaneal fracture 09/15/2014   Ihor Austin, LPTA; [facility]  Aldona Lento 07/23/2017, 6:17 PM  [facility] [address] [place], [place], [address] Phone: [phone]   Fax:  [phone]  Name: Bradley Espinoza MRN: [id number] Date of Birth: 04/22/1956

## 2017-07-25 ENCOUNTER — Encounter (HOSPITAL_COMMUNITY): Payer: Self-pay

## 2017-07-25 ENCOUNTER — Ambulatory Visit (HOSPITAL_COMMUNITY): Payer: PPO

## 2017-07-25 DIAGNOSIS — R262 Difficulty in walking, not elsewhere classified: Secondary | ICD-10-CM

## 2017-07-25 DIAGNOSIS — M25571 Pain in right ankle and joints of right foot: Secondary | ICD-10-CM

## 2017-07-25 DIAGNOSIS — M25671 Stiffness of right ankle, not elsewhere classified: Secondary | ICD-10-CM

## 2017-07-25 DIAGNOSIS — R29898 Other symptoms and signs involving the musculoskeletal system: Secondary | ICD-10-CM

## 2017-07-25 DIAGNOSIS — M545 Low back pain: Secondary | ICD-10-CM | POA: Diagnosis not present

## 2017-07-25 NOTE — Therapy (Signed)
[facility] [place], [place], [address] Phone: [phone]   Fax:  [phone]  Physical Therapy Treatment  Patient Details  Name: Bradley Espinoza MRN: [id number] Date of Birth: 08/26/1956 Referring Provider: Celene Squibb, MD  Encounter Date: 07/25/2017      PT End of Session - 07/25/17 1354    Visit Number 6   Number of Visits 9   Date for PT Re-Evaluation 08/07/17   Authorization Type [employer]   Authorization Time Period 07/10/17 to 08/07/17   Authorization - Visit Number 6   Authorization - Number of Visits 10   PT Start Time 1350   PT Stop Time 1435   PT Time Calculation (min) 45 min   Activity Tolerance Patient tolerated treatment well;No increased pain   Behavior During Therapy WFL for tasks assessed/performed      Past Medical History:  Diagnosis Date  . Anxiety   . Arthritis    low back pain, lumbar radiculopathy  . Depression   . Fracture    B/L ankles  . GERD (gastroesophageal reflux disease)   . Headache(784.0)    allergy related   . History of kidney stones   . History of stomach ulcers   . Retained orthopedic hardware    failed retained hardware right foot  . Wears glasses     Past Surgical History:  Procedure Laterality Date  . ANKLE FUSION Right 05/11/2015   Procedure: Right Posterior Arthroscopic Subtalar Arthrodesis;  Surgeon: Newt Minion, MD;  Location: [place];  Service: Orthopedics;  Laterality: Right;  . ANTERIOR LAT LUMBAR FUSION N/A 04/05/2017   Procedure: Extreme Lateral Interbody Fusion - Lumbar three-lumbar four ,exploration of fusion Posterior augmentation with globus addition Removal hardware Lumbar one-three. Lumbar four-sacral one,  Removal internal bone growth stimulator;  Surgeon: Kary Kos, MD;  Location: [place];  Service: Neurosurgery;  Laterality: N/A;  . BACK SURGERY  2004   x 2  . CERVICAL SPINE SURGERY  2008  . ESOPHAGOGASTRODUODENOSCOPY    . HARDWARE  REMOVAL Right 10/09/2014   Procedure: Removal Deep Hardware, Irrigation and Debridement Calcaneus, Place Antibiotic Beads and Wound VAC ;  Surgeon: Newt Minion, MD;  Location: [facility];  Service: Orthopedics;  Laterality: Right;  . HARDWARE REMOVAL Right 08/12/2015   Procedure: Removal Deep Hardware Right Foot;  Surgeon: Newt Minion, MD;  Location: [facility];  Service: Orthopedics;  Laterality: Right;  . I&D EXTREMITY Right 09/15/2014   Procedure: IRRIGATION AND DEBRIDEMENT Ankle;  Surgeon: Renette Butters, MD;  Location: [facility];  Service: Orthopedics;  Laterality: Right;  . INGUINAL HERNIA REPAIR Bilateral   . LUMBAR PERCUTANEOUS PEDICLE SCREW 1 LEVEL N/A 04/05/2017   Procedure: LUMBAR PERCUTANEOUS PEDICLE SCREW LUMBAR THREE-FOUR;  Surgeon: Kary Kos, MD;  Location: [facility];  Service: Neurosurgery;  Laterality: N/A;  . ORIF CALCANEOUS FRACTURE Right 09/19/2014   Procedure: OPEN REDUCTION INTERNAL FIXATION (ORIF) CALCANEOUS FRACTURE;  Surgeon: Newt Minion, MD;  Location: [facility];  Service: Orthopedics;  Laterality: Right;  . ORIF CALCANEOUS FRACTURE Left 09/19/2014   Procedure: OPEN REDUCTION INTERNAL FIXATION (ORIF) CALCANEOUS FRACTURE;  Surgeon: Newt Minion, MD;  Location: [facility];  Service: Orthopedics;  Laterality: Left;    There were no vitals filed for this visit.      Subjective Assessment - 07/25/17 1352    Subjective Pt reports burning on ankle with weight bearing or dorsal aspect of joint, pain scale 5/10.  Pertinent History history of B calcaneal fratures and lumbar fractures after fall off a ladder; history of mulitple surgeries for feet and lumbar spine    Patient Stated Goals walk and stand longer with no pain   Currently in Pain? Yes   Pain Score 5    Pain Location Ankle   Pain Orientation Right   Pain Descriptors / Indicators Burning;Pressure;Sharp   Pain Type Chronic pain   Pain Onset More than a month ago   Pain Frequency Constant   Aggravating Factors  walking and standing    Pain Relieving Factors pain meds, icy-hot, NWB   Effect of Pain on Daily Activities increases            [facility] PT Assessment - 07/25/17 0001      Assessment   Medical Diagnosis R foot pain   Referring Provider Celene Squibb, MD   Onset Date/Surgical Date --  chronic   Next MD Visit Dr. [place] Crane around 08/02/17   Prior Therapy PT here for LBP, d/c'd on 10/2     Precautions   Precautions --     AROM   Right Ankle Dorsiflexion 20  was 0   Right Ankle Plantar Flexion --  was 30   Right Ankle Inversion 22  was 15   Right Ankle Eversion 15  was 8                     [facility] Adult PT Treatment/Exercise - 07/25/17 0001      Manual Therapy   Manual Therapy Joint mobilization;Soft tissue mobilization   Manual therapy comments completed separate rest of treatment   Joint Mobilization AP and PA talocrural joint mobs and medial and lateral subtalar joint mobs, 10-15 bouts x 10" each; calcaneal lateral and medial mobility    Soft tissue mobilization STM to dorsal, plantar, medial and lateral aspects of R ankle     Ankle Exercises: Standing   BAPS Standing;Level 3   Rocker Board 2 minutes  R/L and A/P   Heel Raises 20 reps  pain reduced with reps   Toe Raise 20 reps  incline slope, no reports of pain   Other Standing Ankle Exercises gait training for heel to toe      Ankle Exercises: Stretches   Slant Board Stretch 3 reps;30 seconds     Ankle Exercises: Supine   T-Band GTB 10x                  PT Short Term Goals - 07/10/17 1211      PT SHORT TERM GOAL #1   Title Pt will be independent with HEP and perform consistently to decrease pain and maximize overall function.    Time 2   Period Weeks   Status New   Target Date 07/24/17     PT SHORT TERM GOAL #2   Title Pt will have improved R ankle AROM by at least 5 deg to decrease pain and maximize gait.   Time 2   Period Weeks   Status New           PT Long Term Goals - 07/10/17 1212      PT  LONG TERM GOAL #1   Title Pt will have improved R ankle DF and PF AROM by at least 10 deg in order to decrease pain, maximize gait and balance.    Time 4   Period Weeks   Status New   Target Date 08/07/17  PT LONG TERM GOAL #2   Title Pt will have improved 3MWT to at least 771ft with R foot pain no more than 4/10 to demo improved overall function and maximize community access.    Time 4   Period Weeks   Status New     PT LONG TERM GOAL #3   Title Pt will be able to perform R SLS for at least 10 sec to demo improved overall stability and functional strength of the R ankle in order to maximize gait.    Time 4   Period Weeks   Status New     PT LONG TERM GOAL #4   Title Pt will have imrpoved standing and walking tolerance by 50% or > to demo decreased pain in order for pt to perform ADLs and IADLs with greater ease.    Time 4   Period Weeks   Status New               Plan - 07/25/17 1446    Clinical Impression Statement Increased focus on appropriate gait mechanics to improve heel to toe mechanics.  Continued session focus with ankle mobility, strengthening and manual for pain control.  Pt making gradual gain with AROM.  Added theraband for ankle strengthening to improve stability with gait.  EOS with manual STM and joint mobs with reports of pain reduced.     Rehab Potential Fair   Clinical Impairments Affecting Rehab Potential (+) age, overall health status; (-) chronicity of pain, multiple surgeries, mixed outcomes of PT earlier this year, mixed motivation    PT Frequency 2x / week   PT Duration 4 weeks   PT Treatment/Interventions ADLs/Self Care Home Management;Cryotherapy;Electrical Stimulation;Moist Heat;Gait training;Stair training;Functional mobility training;Therapeutic activities;Therapeutic exercise;Balance training;Neuromuscular re-education;Patient/family education;Manual techniques;Passive range of motion;Scar mobilization;Dry needling;Energy  conservation;Splinting;Taping   PT Next Visit Plan continue BAPs for range in standing, joint mobs in all directions, STM for pain and soft tissue restrictions of ankle, functional strengthening, ankle stability and balance activities   PT Home Exercise Plan eval: standing calf stretch on step, ABCs; 10/11: standing calf stretch against wall      Patient will benefit from skilled therapeutic intervention in order to improve the following deficits and impairments:  Abnormal gait, Decreased activity tolerance, Decreased balance, Decreased mobility, Decreased range of motion, Difficulty walking, Hypomobility, Increased muscle spasms, Increased fascial restricitons, Improper body mechanics, Pain  Visit Diagnosis: Pain in right ankle and joints of right foot  Stiffness of right ankle, not elsewhere classified  Difficulty in walking, not elsewhere classified  Other symptoms and signs involving the musculoskeletal system     Problem List Patient Active Problem List   Diagnosis Date Noted  . DDD (degenerative disc disease), lumbar 04/05/2017  . Fracture of L2 vertebra ([facility]) 01/12/2015  . Acute osteomyelitis of calcaneum ([facility]) 10/09/2014  . Dysuria 10/05/2014  . Cellulitis 10/05/2014  . Fall from ladder 09/21/2014  . L2 vertebral fracture ([facility]) 09/21/2014  . Bilateral calcaneal fractures 09/21/2014  . Chronic pain 09/21/2014  . Acute blood loss anemia 09/21/2014  . Open right calcaneal fracture 09/15/2014   Ihor Austin, [place]; [place]  Aldona Lento 07/25/2017, 2:53 PM  [facility] [address] [place], [place], [address] Phone: [phone]   Fax:  [phone]  Name: MIKEL HARDGROVE MRN: [id number] Date of Birth: 1956-09-12

## 2017-07-26 ENCOUNTER — Other Ambulatory Visit (HOSPITAL_COMMUNITY): Payer: Self-pay | Admitting: Internal Medicine

## 2017-07-26 DIAGNOSIS — F17200 Nicotine dependence, unspecified, uncomplicated: Secondary | ICD-10-CM

## 2017-07-26 DIAGNOSIS — Z136 Encounter for screening for cardiovascular disorders: Secondary | ICD-10-CM

## 2017-07-30 ENCOUNTER — Ambulatory Visit (HOSPITAL_COMMUNITY): Payer: PPO

## 2017-07-30 ENCOUNTER — Telehealth (HOSPITAL_COMMUNITY): Payer: Self-pay

## 2017-07-30 ENCOUNTER — Encounter (HOSPITAL_COMMUNITY): Payer: Self-pay

## 2017-07-30 DIAGNOSIS — R29898 Other symptoms and signs involving the musculoskeletal system: Secondary | ICD-10-CM

## 2017-07-30 DIAGNOSIS — M25571 Pain in right ankle and joints of right foot: Secondary | ICD-10-CM

## 2017-07-30 DIAGNOSIS — R262 Difficulty in walking, not elsewhere classified: Secondary | ICD-10-CM

## 2017-07-30 DIAGNOSIS — M25671 Stiffness of right ankle, not elsewhere classified: Secondary | ICD-10-CM

## 2017-07-30 DIAGNOSIS — M545 Low back pain: Secondary | ICD-10-CM | POA: Diagnosis not present

## 2017-07-30 NOTE — Therapy (Signed)
[facility] [address] [place], [place], [address] Phone: [phone]   Fax:  [phone]  Physical Therapy Treatment  Patient Details  Name: Bradley Espinoza MRN: [id number] Date of Birth: 1956-03-01 Referring Provider: Celene Squibb, MD  Encounter Date: 07/30/2017      PT End of Session - 07/30/17 1507    Visit Number 7   Number of Visits 9   Date for PT Re-Evaluation 08/07/17   Authorization Type [employer]   Authorization Time Period 07/10/17 to 08/07/17   Authorization - Visit Number 7   Authorization - Number of Visits 10   PT Start Time 1510   PT Stop Time 6301   PT Time Calculation (min) 45 min   Activity Tolerance Patient tolerated treatment well;No increased pain   Behavior During Therapy WFL for tasks assessed/performed      Past Medical History:  Diagnosis Date  . Anxiety   . Arthritis    low back pain, lumbar radiculopathy  . Depression   . Fracture    B/L ankles  . GERD (gastroesophageal reflux disease)   . Headache(784.0)    allergy related   . History of kidney stones   . History of stomach ulcers   . Retained orthopedic hardware    failed retained hardware right foot  . Wears glasses     Past Surgical History:  Procedure Laterality Date  . ANKLE FUSION Right 05/11/2015   Procedure: Right Posterior Arthroscopic Subtalar Arthrodesis;  Surgeon: Newt Minion, MD;  Location: [facility];  Service: Orthopedics;  Laterality: Right;  . ANTERIOR LAT LUMBAR FUSION N/A 04/05/2017   Procedure: Extreme Lateral Interbody Fusion - Lumbar three-lumbar four ,exploration of fusion Posterior augmentation with globus addition Removal hardware Lumbar one-three. Lumbar four-sacral one,  Removal internal bone growth stimulator;  Surgeon: Kary Kos, MD;  Location: [facility];  Service: Neurosurgery;  Laterality: N/A;  . BACK SURGERY  2004   x 2  . CERVICAL SPINE SURGERY  2008  . ESOPHAGOGASTRODUODENOSCOPY    . HARDWARE  REMOVAL Right 10/09/2014   Procedure: Removal Deep Hardware, Irrigation and Debridement Calcaneus, Place Antibiotic Beads and Wound VAC ;  Surgeon: Newt Minion, MD;  Location: [facility];  Service: Orthopedics;  Laterality: Right;  . HARDWARE REMOVAL Right 08/12/2015   Procedure: Removal Deep Hardware Right Foot;  Surgeon: Newt Minion, MD;  Location: [facility];  Service: Orthopedics;  Laterality: Right;  . I&D EXTREMITY Right 09/15/2014   Procedure: IRRIGATION AND DEBRIDEMENT Ankle;  Surgeon: Renette Butters, MD;  Location: [facility];  Service: Orthopedics;  Laterality: Right;  . INGUINAL HERNIA REPAIR Bilateral   . LUMBAR PERCUTANEOUS PEDICLE SCREW 1 LEVEL N/A 04/05/2017   Procedure: LUMBAR PERCUTANEOUS PEDICLE SCREW LUMBAR THREE-FOUR;  Surgeon: Kary Kos, MD;  Location: [facility];  Service: Neurosurgery;  Laterality: N/A;  . ORIF CALCANEOUS FRACTURE Right 09/19/2014   Procedure: OPEN REDUCTION INTERNAL FIXATION (ORIF) CALCANEOUS FRACTURE;  Surgeon: Newt Minion, MD;  Location: [facility];  Service: Orthopedics;  Laterality: Right;  . ORIF CALCANEOUS FRACTURE Left 09/19/2014   Procedure: OPEN REDUCTION INTERNAL FIXATION (ORIF) CALCANEOUS FRACTURE;  Surgeon: Newt Minion, MD;  Location: [facility];  Service: Orthopedics;  Laterality: Left;    There were no vitals filed for this visit.      Subjective Assessment - 07/30/17 1508    Subjective Pt states that both of his ankles are burning today.    Pertinent History history of B  calcaneal fratures and lumbar fractures after fall off a ladder; history of mulitple surgeries for feet and lumbar spine    Patient Stated Goals walk and stand longer with no pain   Currently in Pain? Yes   Pain Score 7    Pain Location Ankle   Pain Orientation Right;Left   Pain Descriptors / Indicators Burning   Pain Type Chronic pain   Pain Onset More than a month ago   Pain Frequency Constant   Aggravating Factors  walking and standing   Pain Relieving Factors pain meds, icy-hot, NWB    Effect of Pain on Daily Activities increases             [facility] Adult PT Treatment/Exercise - 07/30/17 0001      Manual Therapy   Manual Therapy Joint mobilization;Soft tissue mobilization   Manual therapy comments completed separate rest of treatment   Joint Mobilization AP and PA talocrural joint mobs and medial and lateral subtalar joint mobs, 10-15 bouts x 10" each; calcaneal lateral and medial mobility    Soft tissue mobilization STM to dorsal, plantar, medial and lateral aspects of R ankle     Ankle Exercises: Stretches   Slant Board Stretch 3 reps;30 seconds     Ankle Exercises: Standing   BAPS Standing;Level 3;10 reps  DF/PF, inv/ev, CW/CCW   Heel Raises 20 reps  on slant board   Toe Raise 20 reps  on slant board   Heel Walk (Round Trip) 1   Toe Walk (Round Trip) 1   Other Standing Ankle Exercises step up on BOSU x10 reps each   Other Standing Ankle Exercises SLS with vectors x5RT on RLE; bil lunging on BOSU x10 reps             PT Short Term Goals - 07/10/17 1211      PT SHORT TERM GOAL #1   Title Pt will be independent with HEP and perform consistently to decrease pain and maximize overall function.    Time 2   Period Weeks   Status New   Target Date 07/24/17     PT SHORT TERM GOAL #2   Title Pt will have improved R ankle AROM by at least 5 deg to decrease pain and maximize gait.   Time 2   Period Weeks   Status New           PT Long Term Goals - 07/10/17 1212      PT LONG TERM GOAL #1   Title Pt will have improved R ankle DF and PF AROM by at least 10 deg in order to decrease pain, maximize gait and balance.    Time 4   Period Weeks   Status New   Target Date 08/07/17     PT LONG TERM GOAL #2   Title Pt will have improved 3MWT to at least 713ft with R foot pain no more than 4/10 to demo improved overall function and maximize community access.    Time 4   Period Weeks   Status New     PT LONG TERM GOAL #3   Title Pt will be able to  perform R SLS for at least 10 sec to demo improved overall stability and functional strength of the R ankle in order to maximize gait.    Time 4   Period Weeks   Status New     PT LONG TERM GOAL #4   Title Pt will have imrpoved standing and walking tolerance  by 50% or > to demo decreased pain in order for pt to perform ADLs and IADLs with greater ease.    Time 4   Period Weeks   Status New               Plan - 07/30/17 1559    Clinical Impression Statement Pt tolerated treatment well this date which focused on improving overall mobility, stability, and strength of pt's R ankle. Ended with manual for joint mobs and soft tissue restrictions of R ankle and pt reported decreased pain to 5/10 at EOS.    Rehab Potential Fair   Clinical Impairments Affecting Rehab Potential (+) age, overall health status; (-) chronicity of pain, multiple surgeries, mixed outcomes of PT earlier this year, mixed motivation    PT Frequency 2x / week   PT Duration 4 weeks   PT Treatment/Interventions ADLs/Self Care Home Management;Cryotherapy;Electrical Stimulation;Moist Heat;Gait training;Stair training;Functional mobility training;Therapeutic activities;Therapeutic exercise;Balance training;Neuromuscular re-education;Patient/family education;Manual techniques;Passive range of motion;Scar mobilization;Dry needling;Energy conservation;Splinting;Taping   PT Next Visit Plan continue BAPs for range in standing, joint mobs in all directions, STM for pain and soft tissue restrictions of ankle, functional strengthening, ankle stability and balance activities; add heel raises on foam and SLS on foam   PT Home Exercise Plan eval: standing calf stretch on step, ABCs; 10/11: standing calf stretch against wall   Consulted and Agree with Plan of Care Patient      Patient will benefit from skilled therapeutic intervention in order to improve the following deficits and impairments:  Abnormal gait, Decreased activity  tolerance, Decreased balance, Decreased mobility, Decreased range of motion, Difficulty walking, Hypomobility, Increased muscle spasms, Increased fascial restricitons, Improper body mechanics, Pain  Visit Diagnosis: Pain in right ankle and joints of right foot  Stiffness of right ankle, not elsewhere classified  Difficulty in walking, not elsewhere classified  Other symptoms and signs involving the musculoskeletal system     Problem List Patient Active Problem List   Diagnosis Date Noted  . DDD (degenerative disc disease), lumbar 04/05/2017  . Fracture of L2 vertebra ([facility]) 01/12/2015  . Acute osteomyelitis of calcaneum ([facility]) 10/09/2014  . Dysuria 10/05/2014  . Cellulitis 10/05/2014  . Fall from ladder 09/21/2014  . L2 vertebral fracture ([facility]) 09/21/2014  . Bilateral calcaneal fractures 09/21/2014  . Chronic pain 09/21/2014  . Acute blood loss anemia 09/21/2014  . Open right calcaneal fracture 09/15/2014        Geraldine Solar PT, DPT  [facility] [address] [place], [place], [address] Phone: [phone]   Fax:  [phone]  Name: Bradley Espinoza MRN: [id number] Date of Birth: April 05, 1956

## 2017-07-30 NOTE — Telephone Encounter (Signed)
Pt came in with denial paperwork from [employer]. I called [employer] spoke with Lanny Hurst. Lanny Hurst states all patients bill have been put on a spread sht and resummited to [employer]. The patient needs to take no action at this time. NF 07/30/17

## 2017-08-01 ENCOUNTER — Ambulatory Visit (HOSPITAL_COMMUNITY): Payer: PPO

## 2017-08-01 ENCOUNTER — Encounter (HOSPITAL_COMMUNITY): Payer: Self-pay

## 2017-08-01 DIAGNOSIS — Z6824 Body mass index (BMI) 24.0-24.9, adult: Secondary | ICD-10-CM | POA: Diagnosis not present

## 2017-08-01 DIAGNOSIS — M545 Low back pain: Secondary | ICD-10-CM | POA: Diagnosis not present

## 2017-08-01 DIAGNOSIS — F17210 Nicotine dependence, cigarettes, uncomplicated: Secondary | ICD-10-CM | POA: Diagnosis not present

## 2017-08-01 DIAGNOSIS — M25671 Stiffness of right ankle, not elsewhere classified: Secondary | ICD-10-CM

## 2017-08-01 DIAGNOSIS — H65199 Other acute nonsuppurative otitis media, unspecified ear: Secondary | ICD-10-CM | POA: Diagnosis not present

## 2017-08-01 DIAGNOSIS — R29898 Other symptoms and signs involving the musculoskeletal system: Secondary | ICD-10-CM

## 2017-08-01 DIAGNOSIS — J45909 Unspecified asthma, uncomplicated: Secondary | ICD-10-CM | POA: Diagnosis not present

## 2017-08-01 DIAGNOSIS — M25571 Pain in right ankle and joints of right foot: Secondary | ICD-10-CM

## 2017-08-01 DIAGNOSIS — R262 Difficulty in walking, not elsewhere classified: Secondary | ICD-10-CM

## 2017-08-01 DIAGNOSIS — E291 Testicular hypofunction: Secondary | ICD-10-CM | POA: Diagnosis not present

## 2017-08-01 DIAGNOSIS — N529 Male erectile dysfunction, unspecified: Secondary | ICD-10-CM | POA: Diagnosis not present

## 2017-08-01 NOTE — Therapy (Signed)
[facility] [place], [place], [address] Phone: [phone]   Fax:  [phone]  Physical Therapy Treatment  Patient Details  Name: Bradley Espinoza MRN: [id number] Date of Birth: 03-28-1956 Referring Provider: Celene Squibb, MD  Encounter Date: 08/01/2017      PT End of Session - 08/01/17 1131    Visit Number 8   Number of Visits 9   Date for PT Re-Evaluation 08/07/17   Authorization Type [employer]   Authorization Time Period 07/10/17 to 08/07/17   Authorization - Visit Number 8   Authorization - Number of Visits 10   PT Start Time 4403   PT Stop Time 1210   PT Time Calculation (min) 47 min   Activity Tolerance Patient tolerated treatment well;No increased pain   Behavior During Therapy WFL for tasks assessed/performed      Past Medical History:  Diagnosis Date  . Anxiety   . Arthritis    low back pain, lumbar radiculopathy  . Depression   . Fracture    B/L ankles  . GERD (gastroesophageal reflux disease)   . Headache(784.0)    allergy related   . History of kidney stones   . History of stomach ulcers   . Retained orthopedic hardware    failed retained hardware right foot  . Wears glasses     Past Surgical History:  Procedure Laterality Date  . ANKLE FUSION Right 05/11/2015   Procedure: Right Posterior Arthroscopic Subtalar Arthrodesis;  Surgeon: Newt Minion, MD;  Location: [facility];  Service: Orthopedics;  Laterality: Right;  . ANTERIOR LAT LUMBAR FUSION N/A 04/05/2017   Procedure: Extreme Lateral Interbody Fusion - Lumbar three-lumbar four ,exploration of fusion Posterior augmentation with globus addition Removal hardware Lumbar one-three. Lumbar four-sacral one,  Removal internal bone growth stimulator;  Surgeon: Kary Kos, MD;  Location: [facility];  Service: Neurosurgery;  Laterality: N/A;  . BACK SURGERY  2004   x 2  . CERVICAL SPINE SURGERY  2008  . ESOPHAGOGASTRODUODENOSCOPY    . HARDWARE  REMOVAL Right 10/09/2014   Procedure: Removal Deep Hardware, Irrigation and Debridement Calcaneus, Place Antibiotic Beads and Wound VAC ;  Surgeon: Newt Minion, MD;  Location: [facility];  Service: Orthopedics;  Laterality: Right;  . HARDWARE REMOVAL Right 08/12/2015   Procedure: Removal Deep Hardware Right Foot;  Surgeon: Newt Minion, MD;  Location: [facility];  Service: Orthopedics;  Laterality: Right;  . I&D EXTREMITY Right 09/15/2014   Procedure: IRRIGATION AND DEBRIDEMENT Ankle;  Surgeon: Renette Butters, MD;  Location: [facility];  Service: Orthopedics;  Laterality: Right;  . INGUINAL HERNIA REPAIR Bilateral   . LUMBAR PERCUTANEOUS PEDICLE SCREW 1 LEVEL N/A 04/05/2017   Procedure: LUMBAR PERCUTANEOUS PEDICLE SCREW LUMBAR THREE-FOUR;  Surgeon: Kary Kos, MD;  Location: [facility];  Service: Neurosurgery;  Laterality: N/A;  . ORIF CALCANEOUS FRACTURE Right 09/19/2014   Procedure: OPEN REDUCTION INTERNAL FIXATION (ORIF) CALCANEOUS FRACTURE;  Surgeon: Newt Minion, MD;  Location: [facility];  Service: Orthopedics;  Laterality: Right;  . ORIF CALCANEOUS FRACTURE Left 09/19/2014   Procedure: OPEN REDUCTION INTERNAL FIXATION (ORIF) CALCANEOUS FRACTURE;  Surgeon: Newt Minion, MD;  Location: [facility];  Service: Orthopedics;  Laterality: Left;    There were no vitals filed for this visit.      Subjective Assessment - 08/01/17 1128    Subjective Pt stated he continues to have soreness in anterior and posterior ankle.     Pertinent History  history of B calcaneal fratures and lumbar fractures after fall off a ladder; history of mulitple surgeries for feet and lumbar spine    Patient Stated Goals walk and stand longer with no pain   Currently in Pain? Yes   Pain Score 6    Pain Location Ankle   Pain Orientation Right;Anterior;Posterior   Pain Descriptors / Indicators Sore;Aching;Throbbing   Pain Type Chronic pain   Pain Onset More than a month ago   Pain Frequency Constant   Aggravating Factors  walking and standing    Pain Relieving Factors pain meds, icy-hot, NWB   Effect of Pain on Daily Activities increases             [facility] Adult PT Treatment/Exercise - 08/01/17 0001      Manual Therapy   Manual Therapy Joint mobilization;Soft tissue mobilization   Manual therapy comments completed separate rest of treatment   Joint Mobilization AP and PA talocrural joint mobs and medial and lateral subtalar joint mobs, 10-15 bouts x 10" each; calcaneal lateral and medial mobility    Soft tissue mobilization STM to dorsal, plantar, medial and lateral aspects of R ankle     Ankle Exercises: Standing   BAPS Standing;Level 3;10 reps   SLS Lt 18', Rt 13" max of 3 on foam (airex)   Heel Raises 20 reps  on airex   Toe Raise 20 reps  airex   Heel Walk (Round Trip) 1   Toe Walk (Round Trip) 1   Other Standing Ankle Exercises step up on BOSU x10 reps each   Other Standing Ankle Exercises SLS with vectors x5RT on RLE; bil lunging on BOSU x10 reps     Ankle Exercises: Stretches   Slant Board Stretch 3 reps;30 seconds                  PT Short Term Goals - 07/10/17 1211      PT SHORT TERM GOAL #1   Title Pt will be independent with HEP and perform consistently to decrease pain and maximize overall function.    Time 2   Period Weeks   Status New   Target Date 07/24/17     PT SHORT TERM GOAL #2   Title Pt will have improved R ankle AROM by at least 5 deg to decrease pain and maximize gait.   Time 2   Period Weeks   Status New           PT Long Term Goals - 07/10/17 1212      PT LONG TERM GOAL #1   Title Pt will have improved R ankle DF and PF AROM by at least 10 deg in order to decrease pain, maximize gait and balance.    Time 4   Period Weeks   Status New   Target Date 08/07/17     PT LONG TERM GOAL #2   Title Pt will have improved 3MWT to at least 73ft with R foot pain no more than 4/10 to demo improved overall function and maximize community access.    Time 4   Period Weeks    Status New     PT LONG TERM GOAL #3   Title Pt will be able to perform R SLS for at least 10 sec to demo improved overall stability and functional strength of the R ankle in order to maximize gait.    Time 4   Period Weeks   Status New     PT LONG  TERM GOAL #4   Title Pt will have imrpoved standing and walking tolerance by 50% or > to demo decreased pain in order for pt to perform ADLs and IADLs with greater ease.    Time 4   Period Weeks   Status New               Plan - 08/01/17 1216    Clinical Impression Statement Added foam with heel raises and SLS for improve proprioception and strengthening.  Pt does continues to demonstrate impaired dorsiflexion and weakness with gastroc strengtening.  EOS with manual to address soft tissue restrictions and improve ankle mobility.  No reports of increased pain at EOS.   Rehab Potential Fair   Clinical Impairments Affecting Rehab Potential (+) age, overall health status; (-) chronicity of pain, multiple surgeries, mixed outcomes of PT earlier this year, mixed motivation    PT Frequency 2x / week   PT Duration 4 weeks   PT Treatment/Interventions ADLs/Self Care Home Management;Cryotherapy;Electrical Stimulation;Moist Heat;Gait training;Stair training;Functional mobility training;Therapeutic activities;Therapeutic exercise;Balance training;Neuromuscular re-education;Patient/family education;Manual techniques;Passive range of motion;Scar mobilization;Dry needling;Energy conservation;Splinting;Taping   PT Next Visit Plan Reassess next session   PT Home Exercise Plan eval: standing calf stretch on step, ABCs; 10/11: standing calf stretch against wall      Patient will benefit from skilled therapeutic intervention in order to improve the following deficits and impairments:  Abnormal gait, Decreased activity tolerance, Decreased balance, Decreased mobility, Decreased range of motion, Difficulty walking, Hypomobility, Increased muscle spasms,  Increased fascial restricitons, Improper body mechanics, Pain  Visit Diagnosis: Pain in right ankle and joints of right foot  Stiffness of right ankle, not elsewhere classified  Difficulty in walking, not elsewhere classified  Other symptoms and signs involving the musculoskeletal system     Problem List Patient Active Problem List   Diagnosis Date Noted  . DDD (degenerative disc disease), lumbar 04/05/2017  . Fracture of L2 vertebra ([facility]) 01/12/2015  . Acute osteomyelitis of calcaneum ([facility]) 10/09/2014  . Dysuria 10/05/2014  . Cellulitis 10/05/2014  . Fall from ladder 09/21/2014  . L2 vertebral fracture ([facility]) 09/21/2014  . Bilateral calcaneal fractures 09/21/2014  . Chronic pain 09/21/2014  . Acute blood loss anemia 09/21/2014  . Open right calcaneal fracture 09/15/2014   Ihor Austin, [place]; [place]  Aldona Lento 08/01/2017, 12:22 PM  [facility] [address] [place], [place], [address] Phone: [phone]   Fax:  [phone]  Name: AUNDRA ESPIN MRN: [id number] Date of Birth: May 05, 1956

## 2017-08-02 ENCOUNTER — Ambulatory Visit (HOSPITAL_COMMUNITY)
Admission: RE | Admit: 2017-08-02 | Discharge: 2017-08-02 | Disposition: A | Discharge status: Home / Self Care | Payer: PPO | Source: Ambulatory Visit | Attending: Internal Medicine | Admitting: Internal Medicine

## 2017-08-02 DIAGNOSIS — I714 Abdominal aortic aneurysm, without rupture: Secondary | ICD-10-CM | POA: Diagnosis not present

## 2017-08-02 DIAGNOSIS — Z136 Encounter for screening for cardiovascular disorders: Secondary | ICD-10-CM | POA: Diagnosis not present

## 2017-08-02 DIAGNOSIS — F17200 Nicotine dependence, unspecified, uncomplicated: Secondary | ICD-10-CM | POA: Insufficient documentation

## 2017-08-06 ENCOUNTER — Encounter (HOSPITAL_COMMUNITY): Payer: Self-pay | Admitting: Physical Therapy

## 2017-08-06 ENCOUNTER — Ambulatory Visit (HOSPITAL_COMMUNITY): Payer: PPO | Admitting: Physical Therapy

## 2017-08-06 DIAGNOSIS — R29898 Other symptoms and signs involving the musculoskeletal system: Secondary | ICD-10-CM

## 2017-08-06 DIAGNOSIS — M545 Low back pain: Secondary | ICD-10-CM | POA: Diagnosis not present

## 2017-08-06 DIAGNOSIS — R262 Difficulty in walking, not elsewhere classified: Secondary | ICD-10-CM

## 2017-08-06 DIAGNOSIS — M25671 Stiffness of right ankle, not elsewhere classified: Secondary | ICD-10-CM

## 2017-08-06 DIAGNOSIS — M25571 Pain in right ankle and joints of right foot: Secondary | ICD-10-CM

## 2017-08-06 NOTE — Patient Instructions (Signed)
FOUR WAY ANKLE STRETCH  While sitting in a regular chair with your right leg crossed over your left, passively perform the following motions:  1) Push your toes up as if you were trying to stretch your calf; your hand should be on the bottom of your foot. Hold for 10 seconds.  2) WIth your hand on the top of your foot, push your foot down so you feel stretching/pulling in the front of your ankle. Hold for 10 seconds.  3) Use your hand to passively turn your foot inwards; hold for 10 seconds  4) Use your hand to passively turn your foot outwards; hold for 10 seconds.   SELF- MASSAGE   You may use a tennis ball/lacrosse ball/pool ball etc for the following massages:  1) SIt with your right leg crossed over your left, and use the ball to massage the muscles on the inside and back of your foot and ankle.   2) Sitting in a regular chair, put your foot down on top of the ball and use as much pressure as you can tolerate; move the ball around so that you are massaging the muscles on the bottom of your foot.

## 2017-08-06 NOTE — Therapy (Signed)
[facility] [address] [place], [place], [address] Phone: [phone]   Fax:  [phone]  Physical Therapy Treatment (Discharge)  Patient Details  Name: Bradley Espinoza MRN: [id number] Date of Birth: [birth year] Referring Provider: Celene Squibb, MD  Encounter Date: 08/06/2017   PHYSICAL THERAPY DISCHARGE SUMMARY  Visits from Start of Care: 9  Current functional level related to goals / functional outcomes: Patient requests DC to HEP at this time.    Remaining deficits: See below    Education / Equipment: See below  Plan: Patient agrees to discharge.  Patient goals were partially met. Patient is being discharged due to the patient's request.  ?????           PT End of Session - 08/06/17 1213    Visit Number 9   Number of Visits 9   Date for PT Re-Evaluation 08/07/17   Authorization Type [employer]   Authorization Time Period 07/10/17 to 08/07/17   Authorization - Visit Number 9   Authorization - Number of Visits 10   PT Start Time 0539   PT Stop Time 7673  DC today, no further skilled PT services appropriate    PT Time Calculation (min) 30 min   Activity Tolerance Patient tolerated treatment well   Behavior During Therapy [facility] for tasks assessed/performed      Past Medical History:  Diagnosis Date  . Anxiety   . Arthritis    low back pain, lumbar radiculopathy  . Depression   . Fracture    B/L ankles  . GERD (gastroesophageal reflux disease)   . Headache(784.0)    allergy related   . History of kidney stones   . History of stomach ulcers   . Retained orthopedic hardware    failed retained hardware right foot  . Wears glasses     Past Surgical History:  Procedure Laterality Date  . ANKLE FUSION Right 05/11/2015   Procedure: Right Posterior Arthroscopic Subtalar Arthrodesis;  Surgeon: Newt Minion, MD;  Location: [place];  Service: Orthopedics;  Laterality: Right;  . ANTERIOR LAT LUMBAR FUSION  N/A 04/05/2017   Procedure: Extreme Lateral Interbody Fusion - Lumbar three-lumbar four ,exploration of fusion Posterior augmentation with globus addition Removal hardware Lumbar one-three. Lumbar four-sacral one,  Removal internal bone growth stimulator;  Surgeon: Kary Kos, MD;  Location: [place];  Service: Neurosurgery;  Laterality: N/A;  . BACK SURGERY  2004   x 2  . CERVICAL SPINE SURGERY  2008  . ESOPHAGOGASTRODUODENOSCOPY    . HARDWARE REMOVAL Right 10/09/2014   Procedure: Removal Deep Hardware, Irrigation and Debridement Calcaneus, Place Antibiotic Beads and Wound VAC ;  Surgeon: Newt Minion, MD;  Location: [place];  Service: Orthopedics;  Laterality: Right;  . HARDWARE REMOVAL Right 08/12/2015   Procedure: Removal Deep Hardware Right Foot;  Surgeon: Newt Minion, MD;  Location: [place];  Service: Orthopedics;  Laterality: Right;  . I&D EXTREMITY Right 09/15/2014   Procedure: IRRIGATION AND DEBRIDEMENT Ankle;  Surgeon: Renette Butters, MD;  Location: [place];  Service: Orthopedics;  Laterality: Right;  . INGUINAL HERNIA REPAIR Bilateral   . LUMBAR PERCUTANEOUS PEDICLE SCREW 1 LEVEL N/A 04/05/2017   Procedure: LUMBAR PERCUTANEOUS PEDICLE SCREW LUMBAR THREE-FOUR;  Surgeon: Kary Kos, MD;  Location: [place];  Service: Neurosurgery;  Laterality: N/A;  . ORIF CALCANEOUS FRACTURE Right 09/19/2014   Procedure: OPEN REDUCTION INTERNAL FIXATION (ORIF) CALCANEOUS FRACTURE;  Surgeon: Newt Minion, MD;  [facility];  Service: Orthopedics;  Laterality: Right;  . ORIF CALCANEOUS FRACTURE Left 09/19/2014   Procedure: OPEN REDUCTION INTERNAL FIXATION (ORIF) CALCANEOUS FRACTURE;  Surgeon: Newt Minion, MD;  Location: [place];  Service: Orthopedics;  Laterality: Left;    There were no vitals filed for this visit.      Subjective Assessment - 08/06/17 0950    Subjective Patient arrives stating he would like today to be his last day; he has less pain but not  a whole lot is easier. He still has a lot of  burning sensations and numbness in the mornings. No falls or close calls.    Pertinent History history of B calcaneal fratures and lumbar fractures after fall off a ladder; history of mulitple surgeries for feet and lumbar spine    How long can you sit comfortably? 10/30- unlimited    How long can you stand comfortably? 10/30- 15 minutes    How long can you walk comfortably? 10/30- 30 minutes or less    Patient Stated Goals walk and stand longer with no pain   Currently in Pain? Yes   Pain Score 5    Pain Location Foot   Pain Orientation Right   Pain Descriptors / Indicators Aching;Sore;Throbbing            [facility] PT Assessment - 08/06/17 0001      Figure 8 Edema   Figure 8 - Right  20.25"      AROM   Right Ankle Dorsiflexion 15   Right Ankle Plantar Flexion 35   Right Ankle Inversion 15   Right Ankle Eversion 10     Strength   Right Ankle Dorsiflexion 5/5   Right Ankle Inversion 5/5   Right Ankle Eversion 4+/5   Left Ankle Dorsiflexion 5/5   Left Ankle Inversion 5/5   Left Ankle Eversion 5/5     Palpation   Palpation comment tenderness noted B malleolis and heel      Ambulation/Gait   Gait Comments ongoing antalgic pattern      Static Standing Balance   Static Standing Balance -  Activities  Single Leg Stance - Right Leg;Single Leg Stance - Left Leg   Static Standing - Comment/# of Minutes R LE 3 seconds, L LE 30 seconds                              PT Education - 08/06/17 1212    Education provided Yes   Education Details discussed progress towards goals as well as importance of keeping up with HEP for foot and back to prevent backsliding; also discussed additional stretches and self-massage techniques as additions to HEP; DC today    Person(s) Educated Patient   Methods Explanation;Handout   Comprehension Verbalized understanding;Returned demonstration          PT Short Term Goals - 08/06/17 1006      PT SHORT TERM GOAL #1   Title  Pt will be independent with HEP and perform consistently to decrease pain and maximize overall function.    Baseline 10/30- compliant    Time 2   Period Weeks   Status Achieved     PT SHORT TERM GOAL #2   Title Pt will have improved R ankle AROM by at least 5 deg to decrease pain and maximize gait.   Baseline 10/30- ROM has improved but gait still difficult    Time 2   Period Weeks  Status Partially Met     PT SHORT TERM GOAL #3   Title Patient to experience pain as being no more than 6/10 in order to improve QOL and functional task tolerance    Baseline 08-13-23- met    Time 2   Period Weeks   Status Partially Met           PT Long Term Goals - 08/12/17 1007      PT LONG TERM GOAL #1   Title Pt will have improved R ankle DF and PF AROM by at least 10 deg in order to decrease pain, maximize gait and balance.    Baseline 08/13/23- ongoing stiffness especially in ankle PF    Time 4   Period Weeks   Status Partially Met     PT LONG TERM GOAL #2   Title Pt will have improved 3MWT to at least 745f with R foot pain no more than 4/10 to demo improved overall function and maximize community access.    Baseline 111/05/24 pain can still flare    Time 4   Period Weeks   Status On-going     PT LONG TERM GOAL #3   Title Pt will be able to perform R SLS for at least 10 sec to demo improved overall stability and functional strength of the R ankle in order to maximize gait.    Baseline 1Nov 05, 2024 3  seconds R LE    Time 4   Period Weeks   Status On-going     PT LONG TERM GOAL #4   Title Pt will have imrpoved standing and walking tolerance by 50% or > to demo decreased pain in order for pt to perform ADLs and IADLs with greater ease.    Baseline 111/05/24 improved, at 50% mark for foot    Time 4   Period Weeks   Status Achieved               Plan - 111/05/181214    Clinical Impression Statement Re-assessment performed today. Patient appears to have made some mild objective gains  with skilled PT services, and reports some considerable QOL improvements as well however continues to be limited by pain primarily in his R foot. Discussed self-PROM/stretches and ankle stretches as well as self-massage using tennis or lacrosse ball. DC today per patient request.    Rehab Potential Fair   Clinical Impairments Affecting Rehab Potential (+) age, overall health status; (-) chronicity of pain, multiple surgeries, mixed outcomes of PT earlier this year, mixed motivation    PT Next Visit Plan DC today    PT Home Exercise Plan eval: standing calf stretch on step, ABCs; 10/11: standing calf stretch against wall 12024/11/05 4 way ankle PROM and stretches, self-massage to calf and foot    Consulted and Agree with Plan of Care Patient      Patient will benefit from skilled therapeutic intervention in order to improve the following deficits and impairments:  Abnormal gait, Decreased activity tolerance, Decreased balance, Decreased mobility, Decreased range of motion, Difficulty walking, Hypomobility, Increased muscle spasms, Increased fascial restricitons, Improper body mechanics, Pain  Visit Diagnosis: Pain in right ankle and joints of right foot  Stiffness of right ankle, not elsewhere classified  Difficulty in walking, not elsewhere classified  Other symptoms and signs involving the musculoskeletal system       G-Codes - [id number]    Functional Assessment Tool Used (Outpatient Only) FOTO, clinical judgement, 3MWT, SLS, AROM   Functional Limitation  Mobility: Walking and moving around   Mobility: Walking and Moving Around Goal Status [phone]) At least 20 percent but less than 40 percent impaired, limited or restricted   Mobility: Walking and Moving Around Discharge Status [phone]) At least 40 percent but less than 60 percent impaired, limited or restricted      Problem List Patient Active Problem List   Diagnosis Date Noted  . DDD (degenerative disc disease), lumbar 04/05/2017   . Fracture of L2 vertebra ([place]) 01/12/2015  . Acute osteomyelitis of calcaneum ([place]) 10/09/2014  . Dysuria 10/05/2014  . Cellulitis 10/05/2014  . Fall from ladder 09/21/2014  . L2 vertebral fracture ([place]) 09/21/2014  . Bilateral calcaneal fractures 09/21/2014  . Chronic pain 09/21/2014  . Acute blood loss anemia 09/21/2014  . Open right calcaneal fracture 09/15/2014    Deniece Ree PT, DPT [facility] [address] [place], [place], [address] Phone: [phone]   Fax:  [phone]  Name: EITHEN CASTIGLIA MRN: [id number] Date of Birth: 13-Dec-1955

## 2017-08-19 DIAGNOSIS — R030 Elevated blood-pressure reading, without diagnosis of hypertension: Secondary | ICD-10-CM | POA: Diagnosis not present

## 2017-08-19 DIAGNOSIS — M5416 Radiculopathy, lumbar region: Secondary | ICD-10-CM | POA: Diagnosis not present

## 2017-08-19 DIAGNOSIS — M5137 Other intervertebral disc degeneration, lumbosacral region: Secondary | ICD-10-CM | POA: Diagnosis not present

## 2017-08-26 DIAGNOSIS — J45909 Unspecified asthma, uncomplicated: Secondary | ICD-10-CM | POA: Diagnosis not present

## 2017-08-26 DIAGNOSIS — Z712 Person consulting for explanation of examination or test findings: Secondary | ICD-10-CM | POA: Diagnosis not present

## 2017-08-26 DIAGNOSIS — F17200 Nicotine dependence, unspecified, uncomplicated: Secondary | ICD-10-CM | POA: Diagnosis not present

## 2017-08-26 DIAGNOSIS — E291 Testicular hypofunction: Secondary | ICD-10-CM | POA: Diagnosis not present

## 2017-08-26 DIAGNOSIS — Z6823 Body mass index (BMI) 23.0-23.9, adult: Secondary | ICD-10-CM | POA: Diagnosis not present

## 2017-08-26 DIAGNOSIS — F528 Other sexual dysfunction not due to a substance or known physiological condition: Secondary | ICD-10-CM | POA: Diagnosis not present

## 2017-10-10 ENCOUNTER — Encounter (INDEPENDENT_AMBULATORY_CARE_PROVIDER_SITE_OTHER): Payer: Self-pay | Admitting: Orthopedic Surgery

## 2017-10-10 ENCOUNTER — Ambulatory Visit (INDEPENDENT_AMBULATORY_CARE_PROVIDER_SITE_OTHER): Payer: PPO | Admitting: Orthopedic Surgery

## 2017-10-10 DIAGNOSIS — M5442 Lumbago with sciatica, left side: Secondary | ICD-10-CM | POA: Diagnosis not present

## 2017-10-10 DIAGNOSIS — M5441 Lumbago with sciatica, right side: Secondary | ICD-10-CM | POA: Diagnosis not present

## 2017-10-10 DIAGNOSIS — M545 Low back pain: Secondary | ICD-10-CM | POA: Diagnosis not present

## 2017-10-10 DIAGNOSIS — M5137 Other intervertebral disc degeneration, lumbosacral region: Secondary | ICD-10-CM | POA: Diagnosis not present

## 2017-10-10 DIAGNOSIS — G8929 Other chronic pain: Secondary | ICD-10-CM | POA: Diagnosis not present

## 2017-10-10 DIAGNOSIS — S92002P Unspecified fracture of left calcaneus, subsequent encounter for fracture with malunion: Secondary | ICD-10-CM

## 2017-10-10 DIAGNOSIS — S92001P Unspecified fracture of right calcaneus, subsequent encounter for fracture with malunion: Secondary | ICD-10-CM | POA: Diagnosis not present

## 2017-10-10 NOTE — Progress Notes (Signed)
Office Visit Note   Patient: Bradley Espinoza           Date of Birth: 07-08-1956           MRN: [id number] Visit Date: 10/10/2017              Requested by: Celene Squibb, MD [place], [place] [address] PCP: Celene Squibb, MD  Chief Complaint  Patient presents with  . Right Foot - Pain  . Left Foot - Pain      HPI: Patient is a 62 year old gentleman who presents complaining of bilateral foot pain.  He states he has to walk with a shuffling flat-footed gait due to pain.  He states he can only walk short distances.  He states that recently he has been to a specialist and told that he had nerve damage he has been seen by his primary care physician who increased his Neurontin which she states did help.  Patient has been to physical therapy which she states does not help.  Patient is status post bilateral calcaneal fractures about 3 years ago one open.  Patient developed infection in the right heel status post open reduction internal fixation after the open fracture he had hardware removed and underwent posterior arthroscopic subtalar arthrodesis.  He subsequently had the hardware removed.  Patient complains of persistent pain in both feet.  Assessment & Plan: Visit Diagnoses:  1. Open fracture of both calcanei with malunion, subsequent encounter     Plan: We will obtain a CT scan of the calcaneus and subtalar joint bilaterally.  Patient may have a fibrous union he has no clinical signs or symptoms of infection.  Follow-Up Instructions: Return in about 2 weeks (around 10/24/2017).   Ortho Exam  Patient is alert, oriented, no adenopathy, well-dressed, normal affect, normal respiratory effort. Examination patient has a shuffling gait.  He is good motor strength in both lower extremities does not have a foot drop he can ambulate on his heels.  Patient has no redness no cellulitis no signs of infection.  He has no pain with range of motion of the ankle.  He has no  subtalar motion but attempted subtalar motion reproduces his symptoms.  He has good pulses.  He has no ulcers no callus no signs of any increased pressure on the plantar aspect of his foot.  Imaging: No results found. No images are attached to the encounter.  Labs: Lab Results  Component Value Date   HGBA1C 5.1 06/26/2016   REPTSTATUS 04/10/2017 FINAL 04/05/2017   GRAMSTAIN  04/05/2017    FEW WBC PRESENT, PREDOMINANTLY PMN NO ORGANISMS SEEN    CULT No growth aerobically or anaerobically. 04/05/2017    @LABSALLVALUES (HGBA1)@  There is no height or weight on file to calculate BMI.  Orders:  No orders of the defined types were placed in this encounter.  No orders of the defined types were placed in this encounter.    Procedures: No procedures performed  Clinical Data: No additional findings.  ROS:  All other systems negative, except as noted in the HPI. Review of Systems  Objective: Vital Signs: There were no vitals taken for this visit.  Specialty Comments:  No specialty comments available.  PMFS History: Patient Active Problem List   Diagnosis Date Noted  . DDD (degenerative disc disease), lumbar 04/05/2017  . Fracture of L2 vertebra ([place]) 01/12/2015  . Acute osteomyelitis of calcaneum ([place]) 10/09/2014  . Dysuria 10/05/2014  . Cellulitis 10/05/2014  .  Fall from ladder 09/21/2014  . L2 vertebral fracture ([place]) 09/21/2014  . Bilateral calcaneal fractures 09/21/2014  . Chronic pain 09/21/2014  . Acute blood loss anemia 09/21/2014  . Open right calcaneal fracture 09/15/2014   Past Medical History:  Diagnosis Date  . Anxiety   . Arthritis    low back pain, lumbar radiculopathy  . Depression   . Fracture    B/L ankles  . GERD (gastroesophageal reflux disease)   . Headache(784.0)    allergy related   . History of kidney stones   . History of stomach ulcers   . Retained orthopedic hardware    failed retained hardware right foot  . Wears glasses       Family History  Problem Relation Age of Onset  . Diabetes Father   . Cancer Other     Past Surgical History:  Procedure Laterality Date  . ANKLE FUSION Right 05/11/2015   Procedure: Right Posterior Arthroscopic Subtalar Arthrodesis;  Surgeon: Newt Minion, MD;  Location: [place];  Service: Orthopedics;  Laterality: Right;  . ANTERIOR LAT LUMBAR FUSION N/A 04/05/2017   Procedure: Extreme Lateral Interbody Fusion - Lumbar three-lumbar four ,exploration of fusion Posterior augmentation with globus addition Removal hardware Lumbar one-three. Lumbar four-sacral one,  Removal internal bone growth stimulator;  Surgeon: Kary Kos, MD;  Location: [place];  Service: Neurosurgery;  Laterality: N/A;  . BACK SURGERY  2004   x 2  . CERVICAL SPINE SURGERY  2008  . ESOPHAGOGASTRODUODENOSCOPY    . HARDWARE REMOVAL Right 10/09/2014   Procedure: Removal Deep Hardware, Irrigation and Debridement Calcaneus, Place Antibiotic Beads and Wound VAC ;  Surgeon: Newt Minion, MD;  Location: [place];  Service: Orthopedics;  Laterality: Right;  . HARDWARE REMOVAL Right 08/12/2015   Procedure: Removal Deep Hardware Right Foot;  Surgeon: Newt Minion, MD;  Location: [place];  Service: Orthopedics;  Laterality: Right;  . I&D EXTREMITY Right 09/15/2014   Procedure: IRRIGATION AND DEBRIDEMENT Ankle;  Surgeon: Renette Butters, MD;  Location: [facility];  Service: Orthopedics;  Laterality: Right;  . INGUINAL HERNIA REPAIR Bilateral   . LUMBAR PERCUTANEOUS PEDICLE SCREW 1 LEVEL N/A 04/05/2017   Procedure: LUMBAR PERCUTANEOUS PEDICLE SCREW LUMBAR THREE-FOUR;  Surgeon: Kary Kos, MD;  Location: [place];  Service: Neurosurgery;  Laterality: N/A;  . ORIF CALCANEOUS FRACTURE Right 09/19/2014   Procedure: OPEN REDUCTION INTERNAL FIXATION (ORIF) CALCANEOUS FRACTURE;  Surgeon: Newt Minion, MD;  Location: [place];  Service: Orthopedics;  Laterality: Right;  . ORIF CALCANEOUS FRACTURE Left 09/19/2014   Procedure: OPEN REDUCTION INTERNAL FIXATION  (ORIF) CALCANEOUS FRACTURE;  Surgeon: Newt Minion, MD;  Location: [facility] [facility];  Service: Orthopedics;  Laterality: Left;   Social History   Occupational History    Comment: disabled  Tobacco Use  . Smoking status: Former Smoker    Packs/day: 1.00    Years: 10.00    Pack years: 10.00    Types: Cigarettes, Cigars    Start date: 10/08/1976    Last attempt to quit: 09/07/2014    Years since quitting: 3.0  . Smokeless tobacco: Never Used  . Tobacco comment: 06/26/16 reports smokes a cigar 2 times a week at the most  Substance and Sexual Activity  . Alcohol use: No    Comment: 06/26/16 quit 1998  . Drug use: No  . Sexual activity: Yes    Birth control/protection: None

## 2017-10-11 ENCOUNTER — Other Ambulatory Visit: Payer: Self-pay | Admitting: Neurosurgery

## 2017-10-11 DIAGNOSIS — M5441 Lumbago with sciatica, right side: Principal | ICD-10-CM

## 2017-10-11 DIAGNOSIS — M5442 Lumbago with sciatica, left side: Principal | ICD-10-CM

## 2017-10-11 DIAGNOSIS — G8929 Other chronic pain: Secondary | ICD-10-CM

## 2017-10-15 ENCOUNTER — Other Ambulatory Visit: Payer: PPO

## 2017-10-17 ENCOUNTER — Ambulatory Visit
Admission: RE | Admit: 2017-10-17 | Discharge: 2017-10-17 | Disposition: A | Discharge status: Home / Self Care | Payer: PPO | Source: Ambulatory Visit | Attending: Orthopedic Surgery | Admitting: Orthopedic Surgery

## 2017-10-17 ENCOUNTER — Ambulatory Visit
Admission: RE | Admit: 2017-10-17 | Discharge: 2017-10-17 | Disposition: A | Discharge status: Home / Self Care | Payer: PPO | Source: Ambulatory Visit | Attending: Neurosurgery | Admitting: Neurosurgery

## 2017-10-17 DIAGNOSIS — M5442 Lumbago with sciatica, left side: Principal | ICD-10-CM

## 2017-10-17 DIAGNOSIS — M19072 Primary osteoarthritis, left ankle and foot: Secondary | ICD-10-CM | POA: Diagnosis not present

## 2017-10-17 DIAGNOSIS — G8929 Other chronic pain: Secondary | ICD-10-CM

## 2017-10-17 DIAGNOSIS — S92001P Unspecified fracture of right calcaneus, subsequent encounter for fracture with malunion: Secondary | ICD-10-CM

## 2017-10-17 DIAGNOSIS — S92002P Unspecified fracture of left calcaneus, subsequent encounter for fracture with malunion: Principal | ICD-10-CM

## 2017-10-17 DIAGNOSIS — S92001A Unspecified fracture of right calcaneus, initial encounter for closed fracture: Secondary | ICD-10-CM | POA: Diagnosis not present

## 2017-10-17 DIAGNOSIS — M5441 Lumbago with sciatica, right side: Principal | ICD-10-CM

## 2017-10-17 DIAGNOSIS — S32002A Unstable burst fracture of unspecified lumbar vertebra, initial encounter for closed fracture: Secondary | ICD-10-CM | POA: Diagnosis not present

## 2017-10-22 ENCOUNTER — Encounter (INDEPENDENT_AMBULATORY_CARE_PROVIDER_SITE_OTHER): Payer: Self-pay | Admitting: Orthopedic Surgery

## 2017-10-22 ENCOUNTER — Ambulatory Visit (INDEPENDENT_AMBULATORY_CARE_PROVIDER_SITE_OTHER): Payer: PPO | Admitting: Orthopedic Surgery

## 2017-10-22 DIAGNOSIS — S92001P Unspecified fracture of right calcaneus, subsequent encounter for fracture with malunion: Secondary | ICD-10-CM

## 2017-10-22 DIAGNOSIS — S92002P Unspecified fracture of left calcaneus, subsequent encounter for fracture with malunion: Secondary | ICD-10-CM

## 2017-10-22 DIAGNOSIS — M5442 Lumbago with sciatica, left side: Secondary | ICD-10-CM | POA: Diagnosis not present

## 2017-10-22 DIAGNOSIS — M19172 Post-traumatic osteoarthritis, left ankle and foot: Secondary | ICD-10-CM

## 2017-10-22 DIAGNOSIS — M19171 Post-traumatic osteoarthritis, right ankle and foot: Secondary | ICD-10-CM

## 2017-10-22 NOTE — Progress Notes (Signed)
Office Visit Note   Patient: Bradley Espinoza           Date of Birth: 1956/08/01           MRN: [id number] Visit Date: 10/22/2017              Requested by: Celene Squibb, MD [place], [place] [address] PCP: Celene Squibb, MD  Chief Complaint  Patient presents with  . Right Ankle - Follow-up    Review bilateral CT scan of Ankles  . Left Ankle - Follow-up      HPI: Patient presents in follow-up for the CT scan of both ankles secondary to traumatic arthritis from open calcaneal fractures.  Patient states that on the right side he has pain with range of motion of the ankle pain with subtalar motion on the left side he has no pain with range of motion ankle only pain with subtalar motion.  Patient states that he is not smoking cigarettes and is currently smoking cigars recommended complete smoking cessation for surgery.  Patient states that the internal fixation for his spinal fusion has collapsed and that he is going to be scheduling spinal fusion surgery revision after he stabilizes both feet.  Assessment & Plan: Visit Diagnoses:  1. Open fracture of both calcanei with malunion, subsequent encounter   2. Post-traumatic osteoarthritis, right ankle and foot   3. Post-traumatic osteoarthritis, left ankle and foot     Plan: Recommended proceeding with surgical intervention due to failure of prolonged conservative care.  Patient states he would like to proceed with the right foot first.  Discussed that we would need to proceed with a right tibial calcaneal fusion followed by a left arthroscopic subtalar arthrodesis.  Discussed the patient is increased risk for infection and increased risk for complications secondary to previous bony infection on the right side secondary to the open fracture.  Patient states he understands and wishes to proceed with surgery.  Follow-Up Instructions: Return in about 2 weeks (around 11/05/2017).   Ortho Exam  Patient is alert,  oriented, no adenopathy, well-dressed, normal affect, normal respiratory effort. Examination patient has an antalgic shuffling gait.  He has good pulses bilaterally there is minimal swelling of both lower extremities there is no skin color or temperature changes there is no drainage.  Patient has no pain with range of motion of the left ankle he has pain with attempted range of motion of the subtalar joint.  Review of the CT scan shows restoration of the length and alignment and width of the calcaneus with the internal fixation but patient has had complete collapse of the subtalar joint with bone-on-bone contact.  Radiographs of the right foot and ankle shows a fibrous bar of the tibial talar joint limiting ankle range of motion with collapse of the tibiotalar joint as well as talar collapse of the subtalar joint and complete collapse of the calcaneus due to previous infection and need to remove the hardware.  Imaging: No results found. No images are attached to the encounter.  Labs: Lab Results  Component Value Date   HGBA1C 5.1 06/26/2016   REPTSTATUS 04/10/2017 FINAL 04/05/2017   GRAMSTAIN  04/05/2017    FEW WBC PRESENT, PREDOMINANTLY PMN NO ORGANISMS SEEN    CULT No growth aerobically or anaerobically. 04/05/2017    @LABSALLVALUES (HGBA1)@  There is no height or weight on file to calculate BMI.  Orders:  No orders of the defined types were placed in this encounter.  No orders of the defined types were placed in this encounter.    Procedures: No procedures performed  Clinical Data: No additional findings.  ROS:  All other systems negative, except as noted in the HPI. Review of Systems  Objective: Vital Signs: There were no vitals taken for this visit.  Specialty Comments:  No specialty comments available.  PMFS History: Patient Active Problem List   Diagnosis Date Noted  . Post-traumatic osteoarthritis, left ankle and foot 10/22/2017  . DDD (degenerative disc  disease), lumbar 04/05/2017  . Fracture of L2 vertebra ([place]) 01/12/2015  . Acute osteomyelitis of calcaneum ([place]) 10/09/2014  . Dysuria 10/05/2014  . Cellulitis 10/05/2014  . Fall from ladder 09/21/2014  . L2 vertebral fracture ([place]) 09/21/2014  . Bilateral calcaneal fractures 09/21/2014  . Chronic pain 09/21/2014  . Acute blood loss anemia 09/21/2014  . Open fracture of both calcanei 09/15/2014   Past Medical History:  Diagnosis Date  . Anxiety   . Arthritis    low back pain, lumbar radiculopathy  . Depression   . Fracture    B/L ankles  . GERD (gastroesophageal reflux disease)   . Headache(784.0)    allergy related   . History of kidney stones   . History of stomach ulcers   . Retained orthopedic hardware    failed retained hardware right foot  . Wears glasses     Family History  Problem Relation Age of Onset  . Diabetes Father   . Cancer Other     Past Surgical History:  Procedure Laterality Date  . ANKLE FUSION Right 05/11/2015   Procedure: Right Posterior Arthroscopic Subtalar Arthrodesis;  Surgeon: Newt Minion, MD;  Location: [facility];  Service: Orthopedics;  Laterality: Right;  . ANTERIOR LAT LUMBAR FUSION N/A 04/05/2017   Procedure: Extreme Lateral Interbody Fusion - Lumbar three-lumbar four ,exploration of fusion Posterior augmentation with globus addition Removal hardware Lumbar one-three. Lumbar four-sacral one,  Removal internal bone growth stimulator;  Surgeon: Kary Kos, MD;  Location: [facility];  Service: Neurosurgery;  Laterality: N/A;  . BACK SURGERY  2004   x 2  . CERVICAL SPINE SURGERY  2008  . ESOPHAGOGASTRODUODENOSCOPY    . HARDWARE REMOVAL Right 10/09/2014   Procedure: Removal Deep Hardware, Irrigation and Debridement Calcaneus, Place Antibiotic Beads and Wound VAC ;  Surgeon: Newt Minion, MD;  Location: [facility];  Service: Orthopedics;  Laterality: Right;  . HARDWARE REMOVAL Right 08/12/2015   Procedure: Removal Deep Hardware Right Foot;  Surgeon: Newt Minion, MD;  Location: [place] [facility];  Service: Orthopedics;  Laterality: Right;  . I&D EXTREMITY Right 09/15/2014   Procedure: IRRIGATION AND DEBRIDEMENT Ankle;  Surgeon: Renette Butters, MD;  Location: [facility];  Service: Orthopedics;  Laterality: Right;  . INGUINAL HERNIA REPAIR Bilateral   . LUMBAR PERCUTANEOUS PEDICLE SCREW 1 LEVEL N/A 04/05/2017   Procedure: LUMBAR PERCUTANEOUS PEDICLE SCREW LUMBAR THREE-FOUR;  Surgeon: Kary Kos, MD;  Location: [place];  Service: Neurosurgery;  Laterality: N/A;  . ORIF CALCANEOUS FRACTURE Right 09/19/2014   Procedure: OPEN REDUCTION INTERNAL FIXATION (ORIF) CALCANEOUS FRACTURE;  Surgeon: Newt Minion, MD;  Location: [facility];  Service: Orthopedics;  Laterality: Right;  . ORIF CALCANEOUS FRACTURE Left 09/19/2014   Procedure: OPEN REDUCTION INTERNAL FIXATION (ORIF) CALCANEOUS FRACTURE;  Surgeon: Newt Minion, MD;  Location: [facility];  Service: Orthopedics;  Laterality: Left;   Social History   Occupational History    Comment: disabled  Tobacco Use  . Smoking status:  Former Smoker    Packs/day: 1.00    Years: 10.00    Pack years: 10.00    Types: Cigarettes, Cigars    Start date: 10/08/1976    Last attempt to quit: 09/07/2014    Years since quitting: 3.1  . Smokeless tobacco: Never Used  . Tobacco comment: 06/26/16 reports smokes a cigar 2 times a week at the most  Substance and Sexual Activity  . Alcohol use: No    Comment: 06/26/16 quit 1998  . Drug use: No  . Sexual activity: Yes    Birth control/protection: None

## 2017-11-11 ENCOUNTER — Other Ambulatory Visit (INDEPENDENT_AMBULATORY_CARE_PROVIDER_SITE_OTHER): Payer: Self-pay | Admitting: Family

## 2017-11-12 ENCOUNTER — Inpatient Hospital Stay (INDEPENDENT_AMBULATORY_CARE_PROVIDER_SITE_OTHER): Payer: PPO | Admitting: Orthopedic Surgery

## 2017-11-14 DIAGNOSIS — G8929 Other chronic pain: Secondary | ICD-10-CM | POA: Diagnosis not present

## 2017-11-14 DIAGNOSIS — M5442 Lumbago with sciatica, left side: Secondary | ICD-10-CM | POA: Diagnosis not present

## 2017-11-14 DIAGNOSIS — M5441 Lumbago with sciatica, right side: Secondary | ICD-10-CM | POA: Diagnosis not present

## 2017-11-14 DIAGNOSIS — M545 Low back pain: Secondary | ICD-10-CM | POA: Diagnosis not present

## 2017-11-26 ENCOUNTER — Encounter (HOSPITAL_COMMUNITY): Payer: Self-pay | Admitting: *Deleted

## 2017-11-26 ENCOUNTER — Other Ambulatory Visit: Payer: Self-pay

## 2017-11-26 NOTE — Progress Notes (Signed)
Spoke with pt for pre-op call. Pt denies cardiac history, HTN, diabetes or respiratory issues.

## 2017-11-27 ENCOUNTER — Inpatient Hospital Stay (HOSPITAL_COMMUNITY): Payer: PPO | Admitting: Anesthesiology

## 2017-11-27 ENCOUNTER — Ambulatory Visit (HOSPITAL_COMMUNITY)
Admission: RE | Admit: 2017-11-27 | Discharge: 2017-11-29 | Disposition: A | Discharge status: Home / Self Care | Payer: PPO | Source: Ambulatory Visit | Attending: Orthopedic Surgery | Admitting: Orthopedic Surgery

## 2017-11-27 ENCOUNTER — Encounter (HOSPITAL_COMMUNITY)
Admission: RE | Disposition: A | Discharge status: Home / Self Care | Payer: Self-pay | Source: Ambulatory Visit | Attending: Orthopedic Surgery

## 2017-11-27 ENCOUNTER — Encounter (HOSPITAL_COMMUNITY): Payer: Self-pay | Admitting: Surgery

## 2017-11-27 DIAGNOSIS — F17210 Nicotine dependence, cigarettes, uncomplicated: Secondary | ICD-10-CM | POA: Insufficient documentation

## 2017-11-27 DIAGNOSIS — K219 Gastro-esophageal reflux disease without esophagitis: Secondary | ICD-10-CM | POA: Insufficient documentation

## 2017-11-27 DIAGNOSIS — Z8719 Personal history of other diseases of the digestive system: Secondary | ICD-10-CM | POA: Insufficient documentation

## 2017-11-27 DIAGNOSIS — M87071 Idiopathic aseptic necrosis of right ankle: Secondary | ICD-10-CM

## 2017-11-27 DIAGNOSIS — G8918 Other acute postprocedural pain: Secondary | ICD-10-CM | POA: Diagnosis not present

## 2017-11-27 DIAGNOSIS — M19171 Post-traumatic osteoarthritis, right ankle and foot: Secondary | ICD-10-CM | POA: Diagnosis not present

## 2017-11-27 DIAGNOSIS — Y838 Other surgical procedures as the cause of abnormal reaction of the patient, or of later complication, without mention of misadventure at the time of the procedure: Secondary | ICD-10-CM | POA: Diagnosis not present

## 2017-11-27 DIAGNOSIS — Z79899 Other long term (current) drug therapy: Secondary | ICD-10-CM | POA: Insufficient documentation

## 2017-11-27 DIAGNOSIS — M960 Pseudarthrosis after fusion or arthrodesis: Secondary | ICD-10-CM | POA: Diagnosis not present

## 2017-11-27 DIAGNOSIS — D509 Iron deficiency anemia, unspecified: Secondary | ICD-10-CM | POA: Diagnosis not present

## 2017-11-27 DIAGNOSIS — Z885 Allergy status to narcotic agent status: Secondary | ICD-10-CM | POA: Insufficient documentation

## 2017-11-27 DIAGNOSIS — F419 Anxiety disorder, unspecified: Secondary | ICD-10-CM | POA: Insufficient documentation

## 2017-11-27 DIAGNOSIS — M87874 Other osteonecrosis, right foot: Secondary | ICD-10-CM | POA: Diagnosis not present

## 2017-11-27 DIAGNOSIS — Z87442 Personal history of urinary calculi: Secondary | ICD-10-CM | POA: Diagnosis not present

## 2017-11-27 DIAGNOSIS — F329 Major depressive disorder, single episode, unspecified: Secondary | ICD-10-CM | POA: Insufficient documentation

## 2017-11-27 DIAGNOSIS — S92001A Unspecified fracture of right calcaneus, initial encounter for closed fracture: Secondary | ICD-10-CM | POA: Diagnosis not present

## 2017-11-27 DIAGNOSIS — Z981 Arthrodesis status: Secondary | ICD-10-CM

## 2017-11-27 HISTORY — PX: ANKLE FUSION: SHX5718

## 2017-11-27 LAB — SURGICAL PCR SCREEN
MRSA, PCR: NEGATIVE
STAPHYLOCOCCUS AUREUS: NEGATIVE

## 2017-11-27 LAB — CBC
HCT: 42.7 % (ref 39.0–52.0)
HEMOGLOBIN: 14 g/dL (ref 13.0–17.0)
MCH: 31.7 pg (ref 26.0–34.0)
MCHC: 32.8 g/dL (ref 30.0–36.0)
MCV: 96.8 fL (ref 78.0–100.0)
Platelets: 182 10*3/uL (ref 150–400)
RBC: 4.41 MIL/uL (ref 4.22–5.81)
RDW: 13.5 % (ref 11.5–15.5)
WBC: 6.5 10*3/uL (ref 4.0–10.5)

## 2017-11-27 SURGERY — ANKLE FUSION
Anesthesia: General | Laterality: Right

## 2017-11-27 MED ORDER — ACETAMINOPHEN 325 MG PO TABS: 650.0000 mg | ORAL_TABLET | ORAL | Status: DC | PRN

## 2017-11-27 MED ORDER — DEXAMETHASONE SODIUM PHOSPHATE 10 MG/ML IJ SOLN
INTRAMUSCULAR | Status: DC | PRN
  Administered 2017-11-27: 10 mg via INTRAVENOUS

## 2017-11-27 MED ORDER — VANCOMYCIN HCL 1000 MG IV SOLR
INTRAVENOUS | Status: AC
  Filled 2017-11-27: qty 1000

## 2017-11-27 MED ORDER — OXYCODONE HCL 5 MG PO TABS
10.0000 mg | ORAL_TABLET | ORAL | Status: DC | PRN
  Administered 2017-11-27 – 2017-11-29 (×9): 10 mg via ORAL
  Filled 2017-11-27 (×10): qty 2

## 2017-11-27 MED ORDER — EPHEDRINE SULFATE-NACL 50-0.9 MG/10ML-% IV SOSY
PREFILLED_SYRINGE | INTRAVENOUS | Status: DC | PRN
  Administered 2017-11-27: 15 mg via INTRAVENOUS
  Administered 2017-11-27: 10 mg via INTRAVENOUS
  Administered 2017-11-27: 5 mg via INTRAVENOUS
  Administered 2017-11-27: 10 mg via INTRAVENOUS

## 2017-11-27 MED ORDER — SUGAMMADEX SODIUM 200 MG/2ML IV SOLN
INTRAVENOUS | Status: DC | PRN
  Administered 2017-11-27: 154.2 mg via INTRAVENOUS

## 2017-11-27 MED ORDER — ROCURONIUM BROMIDE 10 MG/ML (PF) SYRINGE
PREFILLED_SYRINGE | INTRAVENOUS | Status: AC
  Filled 2017-11-27: qty 5

## 2017-11-27 MED ORDER — CEFAZOLIN SODIUM-DEXTROSE 2-4 GM/100ML-% IV SOLN
2.0000 g | INTRAVENOUS | Status: AC
  Administered 2017-11-27: 2 g via INTRAVENOUS

## 2017-11-27 MED ORDER — ASPIRIN EC 325 MG PO TBEC
325.0000 mg | DELAYED_RELEASE_TABLET | Freq: Every day | ORAL | Status: DC
  Administered 2017-11-27 – 2017-11-29 (×3): 325 mg via ORAL
  Filled 2017-11-27 (×3): qty 1

## 2017-11-27 MED ORDER — ONDANSETRON HCL 4 MG/2ML IJ SOLN
INTRAMUSCULAR | Status: AC
  Filled 2017-11-27: qty 2

## 2017-11-27 MED ORDER — VANCOMYCIN HCL IN DEXTROSE 1-5 GM/200ML-% IV SOLN
INTRAVENOUS | Status: AC
  Administered 2017-11-27: 1000 mg via INTRAVENOUS
  Filled 2017-11-27: qty 200

## 2017-11-27 MED ORDER — MIDAZOLAM HCL 2 MG/2ML IJ SOLN
INTRAMUSCULAR | Status: AC
  Filled 2017-11-27: qty 2

## 2017-11-27 MED ORDER — MAGNESIUM CITRATE PO SOLN: 1.0000 | Freq: Once | ORAL | Status: DC | PRN

## 2017-11-27 MED ORDER — SODIUM CHLORIDE 0.9 % IV SOLN
INTRAVENOUS | Status: DC
  Administered 2017-11-27: 15:00:00 via INTRAVENOUS

## 2017-11-27 MED ORDER — ALBUTEROL SULFATE (2.5 MG/3ML) 0.083% IN NEBU: 2.5000 mg | INHALATION_SOLUTION | Freq: Four times a day (QID) | RESPIRATORY_TRACT | Status: DC | PRN

## 2017-11-27 MED ORDER — TOBRAMYCIN SULFATE 80 MG/2ML IJ SOLN
INTRAMUSCULAR | Status: AC
  Filled 2017-11-27: qty 4

## 2017-11-27 MED ORDER — PROPOFOL 10 MG/ML IV BOLUS
INTRAVENOUS | Status: DC | PRN
  Administered 2017-11-27: 180 mg via INTRAVENOUS

## 2017-11-27 MED ORDER — SUGAMMADEX SODIUM 200 MG/2ML IV SOLN
INTRAVENOUS | Status: AC
  Filled 2017-11-27: qty 2

## 2017-11-27 MED ORDER — DOCUSATE SODIUM 100 MG PO CAPS
100.0000 mg | ORAL_CAPSULE | Freq: Two times a day (BID) | ORAL | Status: DC
  Administered 2017-11-27 – 2017-11-29 (×5): 100 mg via ORAL
  Filled 2017-11-27 (×5): qty 1

## 2017-11-27 MED ORDER — ROPIVACAINE HCL 7.5 MG/ML IJ SOLN
INTRAMUSCULAR | Status: DC | PRN
  Administered 2017-11-27 (×8): 5 mL via PERINEURAL

## 2017-11-27 MED ORDER — ALBUTEROL SULFATE HFA 108 (90 BASE) MCG/ACT IN AERS: 2.0000 | INHALATION_SPRAY | Freq: Four times a day (QID) | RESPIRATORY_TRACT | Status: DC | PRN

## 2017-11-27 MED ORDER — METHOCARBAMOL 500 MG PO TABS
500.0000 mg | ORAL_TABLET | Freq: Four times a day (QID) | ORAL | Status: DC | PRN
  Administered 2017-11-27 – 2017-11-29 (×6): 500 mg via ORAL
  Filled 2017-11-27 (×6): qty 1

## 2017-11-27 MED ORDER — PHENYLEPHRINE 40 MCG/ML (10ML) SYRINGE FOR IV PUSH (FOR BLOOD PRESSURE SUPPORT)
PREFILLED_SYRINGE | INTRAVENOUS | Status: DC | PRN
  Administered 2017-11-27: 80 ug via INTRAVENOUS
  Administered 2017-11-27: 40 ug via INTRAVENOUS
  Administered 2017-11-27: 80 ug via INTRAVENOUS

## 2017-11-27 MED ORDER — ONDANSETRON HCL 4 MG/2ML IJ SOLN: 4.0000 mg | Freq: Four times a day (QID) | INTRAMUSCULAR | Status: DC | PRN

## 2017-11-27 MED ORDER — MEPERIDINE HCL 50 MG/ML IJ SOLN: 6.2500 mg | INTRAMUSCULAR | Status: DC | PRN

## 2017-11-27 MED ORDER — VANCOMYCIN HCL IN DEXTROSE 1-5 GM/200ML-% IV SOLN
1000.0000 mg | Freq: Once | INTRAVENOUS | Status: AC
  Administered 2017-11-27: 1000 mg via INTRAVENOUS

## 2017-11-27 MED ORDER — FAMOTIDINE 20 MG PO TABS
10.0000 mg | ORAL_TABLET | Freq: Every day | ORAL | Status: DC
  Administered 2017-11-27 – 2017-11-29 (×3): 10 mg via ORAL
  Filled 2017-11-27 (×3): qty 1

## 2017-11-27 MED ORDER — METOCLOPRAMIDE HCL 5 MG/ML IJ SOLN: 5.0000 mg | Freq: Three times a day (TID) | INTRAMUSCULAR | Status: DC | PRN

## 2017-11-27 MED ORDER — FENTANYL CITRATE (PF) 250 MCG/5ML IJ SOLN
INTRAMUSCULAR | Status: AC
  Filled 2017-11-27: qty 5

## 2017-11-27 MED ORDER — ROCURONIUM BROMIDE 10 MG/ML (PF) SYRINGE
PREFILLED_SYRINGE | INTRAVENOUS | Status: DC | PRN
  Administered 2017-11-27: 50 mg via INTRAVENOUS
  Administered 2017-11-27: 10 mg via INTRAVENOUS

## 2017-11-27 MED ORDER — METHOCARBAMOL 1000 MG/10ML IJ SOLN
500.0000 mg | Freq: Four times a day (QID) | INTRAMUSCULAR | Status: DC | PRN
  Filled 2017-11-27: qty 5

## 2017-11-27 MED ORDER — ACETAMINOPHEN 650 MG RE SUPP: 650.0000 mg | RECTAL | Status: DC | PRN

## 2017-11-27 MED ORDER — FENTANYL CITRATE (PF) 100 MCG/2ML IJ SOLN
INTRAMUSCULAR | Status: AC
  Administered 2017-11-27: 100 ug via INTRAVENOUS
  Filled 2017-11-27: qty 2

## 2017-11-27 MED ORDER — MIDAZOLAM HCL 2 MG/2ML IJ SOLN
INTRAMUSCULAR | Status: AC
  Administered 2017-11-27: 2 mg via INTRAVENOUS
  Filled 2017-11-27: qty 2

## 2017-11-27 MED ORDER — DEXAMETHASONE SODIUM PHOSPHATE 10 MG/ML IJ SOLN
INTRAMUSCULAR | Status: AC
  Filled 2017-11-27: qty 1

## 2017-11-27 MED ORDER — BISACODYL 10 MG RE SUPP: 10.0000 mg | Freq: Every day | RECTAL | Status: DC | PRN

## 2017-11-27 MED ORDER — VANCOMYCIN HCL 1000 MG IV SOLR
INTRAVENOUS | Status: DC | PRN
  Administered 2017-11-27: 1000 mg via TOPICAL

## 2017-11-27 MED ORDER — GENTAMICIN SULFATE 40 MG/ML IJ SOLN
INTRAMUSCULAR | Status: DC | PRN
  Administered 2017-11-27: 80 mg

## 2017-11-27 MED ORDER — KETOROLAC TROMETHAMINE 15 MG/ML IJ SOLN
15.0000 mg | Freq: Four times a day (QID) | INTRAMUSCULAR | Status: AC
  Administered 2017-11-27 – 2017-11-28 (×4): 15 mg via INTRAVENOUS
  Filled 2017-11-27 (×3): qty 1

## 2017-11-27 MED ORDER — HYDROMORPHONE HCL 1 MG/ML IJ SOLN
1.0000 mg | INTRAMUSCULAR | Status: DC | PRN
  Administered 2017-11-27 – 2017-11-29 (×9): 1 mg via INTRAVENOUS
  Filled 2017-11-27 (×9): qty 1

## 2017-11-27 MED ORDER — KETOROLAC TROMETHAMINE 30 MG/ML IJ SOLN: 30.0000 mg | Freq: Once | INTRAMUSCULAR | Status: DC | PRN

## 2017-11-27 MED ORDER — CEFAZOLIN SODIUM-DEXTROSE 2-4 GM/100ML-% IV SOLN
INTRAVENOUS | Status: AC
  Filled 2017-11-27: qty 100

## 2017-11-27 MED ORDER — MIDAZOLAM HCL 2 MG/2ML IJ SOLN
2.0000 mg | Freq: Once | INTRAMUSCULAR | Status: AC
  Administered 2017-11-27: 2 mg via INTRAVENOUS

## 2017-11-27 MED ORDER — EPHEDRINE 5 MG/ML INJ
INTRAVENOUS | Status: AC
  Filled 2017-11-27: qty 10

## 2017-11-27 MED ORDER — LIDOCAINE 2% (20 MG/ML) 5 ML SYRINGE
INTRAMUSCULAR | Status: AC
  Filled 2017-11-27: qty 5

## 2017-11-27 MED ORDER — FENTANYL CITRATE (PF) 100 MCG/2ML IJ SOLN
100.0000 ug | Freq: Once | INTRAMUSCULAR | Status: AC
  Administered 2017-11-27: 100 ug via INTRAVENOUS

## 2017-11-27 MED ORDER — ALBUTEROL SULFATE HFA 108 (90 BASE) MCG/ACT IN AERS
INHALATION_SPRAY | RESPIRATORY_TRACT | Status: DC | PRN
  Administered 2017-11-27: 2 via RESPIRATORY_TRACT

## 2017-11-27 MED ORDER — GABAPENTIN 300 MG PO CAPS
300.0000 mg | ORAL_CAPSULE | Freq: Three times a day (TID) | ORAL | Status: DC
  Administered 2017-11-27 – 2017-11-29 (×6): 300 mg via ORAL
  Filled 2017-11-27 (×6): qty 1

## 2017-11-27 MED ORDER — LIDOCAINE 2% (20 MG/ML) 5 ML SYRINGE
INTRAMUSCULAR | Status: DC | PRN
  Administered 2017-11-27: 60 mg via INTRAVENOUS

## 2017-11-27 MED ORDER — LACTATED RINGERS IV SOLN
INTRAVENOUS | Status: DC
  Administered 2017-11-27 (×2): via INTRAVENOUS

## 2017-11-27 MED ORDER — ALBUTEROL SULFATE HFA 108 (90 BASE) MCG/ACT IN AERS
INHALATION_SPRAY | RESPIRATORY_TRACT | Status: AC
  Filled 2017-11-27: qty 6.7

## 2017-11-27 MED ORDER — POLYETHYLENE GLYCOL 3350 17 G PO PACK: 17.0000 g | PACK | Freq: Every day | ORAL | Status: DC | PRN

## 2017-11-27 MED ORDER — METOCLOPRAMIDE HCL 5 MG PO TABS: 5.0000 mg | ORAL_TABLET | Freq: Three times a day (TID) | ORAL | Status: DC | PRN

## 2017-11-27 MED ORDER — PROMETHAZINE HCL 25 MG/ML IJ SOLN: 6.2500 mg | INTRAMUSCULAR | Status: DC | PRN

## 2017-11-27 MED ORDER — MIDAZOLAM HCL 5 MG/5ML IJ SOLN
INTRAMUSCULAR | Status: DC | PRN
  Administered 2017-11-27: 2 mg via INTRAVENOUS

## 2017-11-27 MED ORDER — VANCOMYCIN HCL IN DEXTROSE 1-5 GM/200ML-% IV SOLN
1000.0000 mg | Freq: Two times a day (BID) | INTRAVENOUS | Status: AC
  Administered 2017-11-27: 1000 mg via INTRAVENOUS
  Filled 2017-11-27: qty 200

## 2017-11-27 MED ORDER — GENTAMICIN SULFATE 40 MG/ML IJ SOLN
INTRAMUSCULAR | Status: AC
  Filled 2017-11-27: qty 4

## 2017-11-27 MED ORDER — KETOROLAC TROMETHAMINE 15 MG/ML IJ SOLN
INTRAMUSCULAR | Status: AC
  Filled 2017-11-27: qty 1

## 2017-11-27 MED ORDER — ONDANSETRON HCL 4 MG PO TABS: 4.0000 mg | ORAL_TABLET | Freq: Four times a day (QID) | ORAL | Status: DC | PRN

## 2017-11-27 MED ORDER — CHLORHEXIDINE GLUCONATE 4 % EX LIQD: 60.0000 mL | Freq: Once | CUTANEOUS | Status: DC

## 2017-11-27 MED ORDER — FENTANYL CITRATE (PF) 100 MCG/2ML IJ SOLN: 25.0000 ug | INTRAMUSCULAR | Status: DC | PRN

## 2017-11-27 MED ORDER — ONDANSETRON HCL 4 MG/2ML IJ SOLN
INTRAMUSCULAR | Status: DC | PRN
  Administered 2017-11-27: 4 mg via INTRAVENOUS

## 2017-11-27 MED ORDER — OXYCODONE HCL 5 MG PO TABS: 5.0000 mg | ORAL_TABLET | ORAL | Status: DC | PRN

## 2017-11-27 MED ORDER — PROPOFOL 10 MG/ML IV BOLUS
INTRAVENOUS | Status: AC
  Filled 2017-11-27: qty 20

## 2017-11-27 SURGICAL SUPPLY — 53 items
BANDAGE ESMARK 6X9 LF (GAUZE/BANDAGES/DRESSINGS) ×1 IMPLANT
BIT DRILL CALIBRATED 4.2 (BIT) ×1 IMPLANT
BIT DRILL CALIBRATED 5.0 MM (BIT) ×1 IMPLANT
BLADE SAW SGTL HD 18.5X60.5X1. (BLADE) ×3 IMPLANT
BLADE SURG 10 STRL SS (BLADE) IMPLANT
BNDG COHESIVE 4X5 TAN STRL (GAUZE/BANDAGES/DRESSINGS) ×3 IMPLANT
BNDG ESMARK 6X9 LF (GAUZE/BANDAGES/DRESSINGS) ×3
BNDG GAUZE ELAST 4 BULKY (GAUZE/BANDAGES/DRESSINGS) ×6 IMPLANT
CAP END T25 HF ARTHRO NAIL EX (Cap) ×1 IMPLANT
COVER MAYO STAND STRL (DRAPES) IMPLANT
COVER SURGICAL LIGHT HANDLE (MISCELLANEOUS) ×6 IMPLANT
DRAPE OEC MINIVIEW 54X84 (DRAPES) IMPLANT
DRAPE U-SHAPE 47X51 STRL (DRAPES) ×3 IMPLANT
DRESSING PREVENA PLUS CUSTOM (GAUZE/BANDAGES/DRESSINGS) ×1 IMPLANT
DRILL BIT CALIBRATED 4.2 (BIT) ×3
DRILL BIT CALIBRATED 5.0 MM (BIT) ×3
DRSG ADAPTIC 3X8 NADH LF (GAUZE/BANDAGES/DRESSINGS) ×3 IMPLANT
DRSG PREVENA PLUS CUSTOM (GAUZE/BANDAGES/DRESSINGS) ×3
DURAPREP 26ML APPLICATOR (WOUND CARE) ×3 IMPLANT
ELECT REM PT RETURN 9FT ADLT (ELECTROSURGICAL) ×3
ELECTRODE REM PT RTRN 9FT ADLT (ELECTROSURGICAL) ×1 IMPLANT
ENDCAP T25 HF ARTHRO NAIL EX (Cap) ×2 IMPLANT
GAUZE SPONGE 4X4 12PLY STRL (GAUZE/BANDAGES/DRESSINGS) ×3 IMPLANT
GLOVE BIOGEL PI IND STRL 9 (GLOVE) ×1 IMPLANT
GLOVE BIOGEL PI INDICATOR 9 (GLOVE) ×2
GLOVE SURG ORTHO 9.0 STRL STRW (GLOVE) ×3 IMPLANT
GOWN STRL REUS W/ TWL LRG LVL3 (GOWN DISPOSABLE) ×1 IMPLANT
GOWN STRL REUS W/ TWL XL LVL3 (GOWN DISPOSABLE) ×1 IMPLANT
GOWN STRL REUS W/TWL LRG LVL3 (GOWN DISPOSABLE) ×2
GOWN STRL REUS W/TWL XL LVL3 (GOWN DISPOSABLE) ×2
GUIDEWIRE 3.2X400 (WIRE) ×3 IMPLANT
KIT BASIN OR (CUSTOM PROCEDURE TRAY) ×3 IMPLANT
KIT DRSG PREVENA PLUS 7DAY 125 (MISCELLANEOUS) ×3 IMPLANT
KIT ROOM TURNOVER OR (KITS) ×3 IMPLANT
KIT STIMULAN RAPID CURE  10CC (Orthopedic Implant) ×2 IMPLANT
KIT STIMULAN RAPID CURE 10CC (Orthopedic Implant) ×1 IMPLANT
NAIL EX CANN ARTHRODESIS 10MM (Nail) ×3 IMPLANT
NS IRRIG 1000ML POUR BTL (IV SOLUTION) ×3 IMPLANT
PACK ORTHO EXTREMITY (CUSTOM PROCEDURE TRAY) ×3 IMPLANT
PAD ARMBOARD 7.5X6 YLW CONV (MISCELLANEOUS) ×6 IMPLANT
REAMER ROD DEEP FLUTE 2.5X950 (INSTRUMENTS) ×3 IMPLANT
SCREW LOCK STAR 6X72 (Screw) ×3 IMPLANT
SCREW LOCKING 5.0X32MM (Screw) ×3 IMPLANT
SCREW LOCKING 6.0X68MM (Screw) ×3 IMPLANT
SPONGE LAP 18X18 X RAY DECT (DISPOSABLE) ×3 IMPLANT
SUCTION FRAZIER HANDLE 10FR (MISCELLANEOUS) ×2
SUCTION TUBE FRAZIER 10FR DISP (MISCELLANEOUS) ×1 IMPLANT
SUT ETHILON 2 0 PSLX (SUTURE) ×9 IMPLANT
TOWEL OR 17X24 6PK STRL BLUE (TOWEL DISPOSABLE) ×3 IMPLANT
TOWEL OR 17X26 10 PK STRL BLUE (TOWEL DISPOSABLE) ×3 IMPLANT
TUBE CONNECTING 12'X1/4 (SUCTIONS) ×1
TUBE CONNECTING 12X1/4 (SUCTIONS) ×2 IMPLANT
WATER STERILE IRR 1000ML POUR (IV SOLUTION) ×3 IMPLANT

## 2017-11-27 NOTE — Anesthesia Procedure Notes (Signed)
Anesthesia Regional Block: Adductor canal block   Pre-Anesthetic Checklist: ,, timeout performed, Correct Patient, Correct Site, Correct Laterality, Correct Procedure, Correct Position, site marked, Risks and benefits discussed,  Surgical consent,  Pre-op evaluation,  At surgeon's request and post-op pain management  Laterality: Right and Lower  Prep: chloraprep       Needles:  Injection technique: Single-shot  Needle Type: Echogenic Stimulator Needle     Needle Length: 9cm  Needle Gauge: 21   Needle insertion depth: 3.5 cm   Additional Needles:   Procedures:,,,, ultrasound used (permanent image in chart),,,,  Narrative:  Start time: 11/27/2017 9:58 AM End time: 11/27/2017 10:04 AM Injection made incrementally with aspirations every 5 mL.  Performed by: Personally  Anesthesiologist: Lyn Hollingshead, MD

## 2017-11-27 NOTE — Op Note (Signed)
11/27/2017  11:44 AM  PATIENT:  Bradley Espinoza    PRE-OPERATIVE DIAGNOSIS:  Avascular Necrosis Talus, Calcaneus Fracture Right Foot  POST-OPERATIVE DIAGNOSIS:  Same  PROCEDURE:  RIGHT TIBIOCALCANEAL FUSION, placement of antibiotic beads with 10 cc stimulant with 1 g vancomycin and 240 mg gentamicin, application Praveena wound VAC.  C-arm fluoroscopy  SURGEON:  Newt Minion, MD  PHYSICIAN ASSISTANT:None ANESTHESIA:   General  PREOPERATIVE INDICATIONS:  Bradley Espinoza is a  62 y.o. male with a diagnosis of Avascular Necrosis Talus, Calcaneus Fracture Right Foot who failed conservative measures and elected for surgical management.    The risks benefits and alternatives were discussed with the patient preoperatively including but not limited to the risks of infection, bleeding, nerve injury, cardiopulmonary complications, the need for revision surgery, among others, and the patient was willing to proceed.  OPERATIVE IMPLANTS: Synthes 10 x 150 mm tibial calcaneal nail locked proximally x1 distally x2  Stimulant antibiotic cement with 1 g vancomycin and 240 mg gentamicin  @ENCIMAGES @  OPERATIVE FINDINGS: No abscess no signs of bone infection.  OPERATIVE PROCEDURE: Patient was brought the operating room and underwent a general anesthetic.  After adequate levels of anesthesia were obtained patient was placed in the left lateral decubitus position with the right side up in the right lower extremity was prepped using DuraPrep draped into a sterile field a timeout was called.  A lateral incision was made the distal fibula was resected.  The posterior and anterior structures were protected and a oscillating saw was used to resect the distal tibia and talar dome perpendicular to the long axis of the tibia.  Rondure and curette were used to cleanse the joint the joint was irrigated with normal saline no signs of deep infection.  A plantar incision was made a guidewire was inserted from the  calcaneus into the tibia.  This was overreamed distally a guidewire was then placed and this was sequentially reamed to 11.5 mm with a 10 mm nail.  The nail was inserted 2 screws were placed posteriorly into the calcaneus and one screw was placed proximally into the tibia.  The joint was compressed.  Wound was irrigated with normal saline C-arm fluoroscopy verified alignment in both AP and lateral planes.  Antibiotic beads were placed the skin was closed using 2-0 nylon.  A Praveena wound VAC was applied this had a good suction fit.  Patient was extubated taken to PACU in stable condition.   DISCHARGE PLANNING:  Antibiotic duration: 24 hours perioperatively  Weightbearing: Nonweightbearing on the right  Pain medication: As needed  Dressing care/ Wound VAC: Praveena wound VAC for 1 week  Ambulatory devices: Walker  Discharge to: Home  Follow-up: In the office 1 week post operative.

## 2017-11-27 NOTE — Anesthesia Postprocedure Evaluation (Signed)
Anesthesia Post Note  Patient: Bradley Espinoza  Procedure(s) Performed: RIGHT TIBIOCALCANEAL FUSION (Right )     Patient location during evaluation: PACU Anesthesia Type: General Level of consciousness: awake Pain management: pain level controlled Vital Signs Assessment: post-procedure vital signs reviewed and stable Respiratory status: spontaneous breathing Cardiovascular status: stable Postop Assessment: no apparent nausea or vomiting Anesthetic complications: no    Last Vitals:  Vitals:   11/27/17 1215 11/27/17 1230  BP: 126/77 132/72  Pulse: 60 72  Resp: 17   Temp:    SpO2: 100% 100%    Last Pain:  Vitals:   11/27/17 1215  TempSrc:   PainSc: 0-No pain   Pain Goal: Patients Stated Pain Goal: 8 (11/27/17 0831)               Bradley Espinoza,Bradley Espinoza

## 2017-11-27 NOTE — Anesthesia Procedure Notes (Addendum)
Anesthesia Regional Block: Popliteal block   Pre-Anesthetic Checklist: ,, timeout performed, Correct Patient, Correct Site, Correct Laterality, Correct Procedure, Correct Position, site marked, Risks and benefits discussed,  Surgical consent,  Pre-op evaluation,  At surgeon's request and post-op pain management  Laterality: Right and Lower  Prep: chloraprep       Needles:  Injection technique: Single-shot  Needle Type: Echogenic Stimulator Needle     Needle Length: 9cm  Needle Gauge: 21   Needle insertion depth: 1.5 cm   Additional Needles:   Procedures:,,,, ultrasound used (permanent image in chart),,,,  Narrative:  Start time: 11/27/2017 9:40 AM End time: 11/27/2017 9:48 AM Injection made incrementally with aspirations every 5 mL.  Performed by: Personally  Anesthesiologist: Lyn Hollingshead, MD

## 2017-11-27 NOTE — Progress Notes (Signed)
Orthopedic Tech Progress Note Patient Details:  Bradley Espinoza [birth year] [id number]  Ortho Devices Type of Ortho Device: CAM walker Ortho Device/Splint Location: rle Ortho Device/Splint Interventions: Application   Post Interventions Patient Tolerated: Well Instructions Provided: Care of device   [name] 11/27/2017, 2:44 PM

## 2017-11-27 NOTE — Anesthesia Preprocedure Evaluation (Addendum)
Anesthesia Evaluation  Patient identified by MRN, date of birth, ID band Patient awake    Reviewed: Allergy & Precautions, NPO status , Patient's Chart, lab work & pertinent test results  Airway Mallampati: I       Dental  (+) Poor Dentition, Missing,    Pulmonary Current Smoker,    Pulmonary exam normal breath sounds clear to auscultation       Cardiovascular negative cardio ROS Normal cardiovascular exam Rhythm:Regular Rate:Normal     Neuro/Psych    GI/Hepatic   Endo/Other    Renal/GU      Musculoskeletal   Abdominal Normal abdominal exam  (+)   Peds  Hematology   Anesthesia Other Findings   Reproductive/Obstetrics                            Anesthesia Physical Anesthesia Plan  ASA: II  Anesthesia Plan: General   Post-op Pain Management:  Regional for Post-op pain   Induction:   PONV Risk Score and Plan: 1 and Ondansetron and Dexamethasone  Airway Management Planned: Oral ETT  Additional Equipment:   Intra-op Plan:   Post-operative Plan: Extubation in OR  Informed Consent: I have reviewed the patients History and Physical, chart, labs and discussed the procedure including the risks, benefits and alternatives for the proposed anesthesia with the patient or authorized representative who has indicated his/her understanding and acceptance.   Dental advisory given  Plan Discussed with: CRNA and Surgeon  Anesthesia Plan Comments:         Anesthesia Quick Evaluation

## 2017-11-27 NOTE — Anesthesia Procedure Notes (Signed)
Procedure Name: Intubation Date/Time: 11/27/2017 10:35 AM Performed by: Freddie Breech, CRNA Pre-anesthesia Checklist: Patient identified, Emergency Drugs available, Suction available and Patient being monitored Patient Re-evaluated:Patient Re-evaluated prior to induction Oxygen Delivery Method: Circle System Utilized Preoxygenation: Pre-oxygenation with 100% oxygen Induction Type: IV induction Ventilation: Mask ventilation without difficulty Laryngoscope Size: Mac and 4 Grade View: Grade II Tube type: Oral Tube size: 7.0 mm Number of attempts: 1 Airway Equipment and Method: Stylet and Oral airway Placement Confirmation: ETT inserted through vocal cords under direct vision,  positive ETCO2 and breath sounds checked- equal and bilateral Secured at: 23 cm Tube secured with: Tape Dental Injury: Teeth and Oropharynx as per pre-operative assessment

## 2017-11-27 NOTE — H&P (Signed)
Bradley Espinoza is an 62 y.o. male.   Chief Complaint: Right ankle subtalar pain. HPI: Patient is a 62 year old gentleman who is status post open calcaneal fracture in 2015.  Patient initially underwent open reduction internal fixation, subsequently underwent hardware removal and subtalar fusion.  Patient presents at this time with persistent pain with a fibrous nonunion of the subtalar joint and traumatic arthritis of the right ankle.  Past Medical History:  Diagnosis Date  . Anxiety   . Arthritis    low back pain, lumbar radiculopathy  . Depression   . Fracture    B/L ankles  . GERD (gastroesophageal reflux disease)   . Headache(784.0)    allergy related   . History of kidney stones   . History of stomach ulcers   . Retained orthopedic hardware    failed retained hardware right foot  . Wears glasses     Past Surgical History:  Procedure Laterality Date  . ANKLE FUSION Right 05/11/2015   Procedure: Right Posterior Arthroscopic Subtalar Arthrodesis;  Surgeon: Newt Minion, MD;  Location: [facility];  Service: Orthopedics;  Laterality: Right;  . ANTERIOR LAT LUMBAR FUSION N/A 04/05/2017   Procedure: Extreme Lateral Interbody Fusion - Lumbar three-lumbar four ,exploration of fusion Posterior augmentation with globus addition Removal hardware Lumbar one-three. Lumbar four-sacral one,  Removal internal bone growth stimulator;  Surgeon: Kary Kos, MD;  Location: [facility];  Service: Neurosurgery;  Laterality: N/A;  . BACK SURGERY  2004   x 2  . CERVICAL SPINE SURGERY  2008  . ESOPHAGOGASTRODUODENOSCOPY    . HARDWARE REMOVAL Right 10/09/2014   Procedure: Removal Deep Hardware, Irrigation and Debridement Calcaneus, Place Antibiotic Beads and Wound VAC ;  Surgeon: Newt Minion, MD;  Location: [facility];  Service: Orthopedics;  Laterality: Right;  . HARDWARE REMOVAL Right 08/12/2015   Procedure: Removal Deep Hardware Right Foot;  Surgeon: Newt Minion, MD;  Location: [facility];  Service: Orthopedics;   Laterality: Right;  . I&D EXTREMITY Right 09/15/2014   Procedure: IRRIGATION AND DEBRIDEMENT Ankle;  Surgeon: Renette Butters, MD;  Location: [facility];  Service: Orthopedics;  Laterality: Right;  . INGUINAL HERNIA REPAIR Bilateral   . LUMBAR PERCUTANEOUS PEDICLE SCREW 1 LEVEL N/A 04/05/2017   Procedure: LUMBAR PERCUTANEOUS PEDICLE SCREW LUMBAR THREE-FOUR;  Surgeon: Kary Kos, MD;  Location: [facility];  Service: Neurosurgery;  Laterality: N/A;  . ORIF CALCANEOUS FRACTURE Right 09/19/2014   Procedure: OPEN REDUCTION INTERNAL FIXATION (ORIF) CALCANEOUS FRACTURE;  Surgeon: Newt Minion, MD;  Location: [facility];  Service: Orthopedics;  Laterality: Right;  . ORIF CALCANEOUS FRACTURE Left 09/19/2014   Procedure: OPEN REDUCTION INTERNAL FIXATION (ORIF) CALCANEOUS FRACTURE;  Surgeon: Newt Minion, MD;  Location: [facility];  Service: Orthopedics;  Laterality: Left;    Family History  Problem Relation Age of Onset  . Diabetes Father   . Cancer Other    Social History:  reports that he has been smoking cigars.  He started smoking about 41 years ago. He has a 10.00 pack-year smoking history. he has never used smokeless tobacco. He reports that he does not drink alcohol or use drugs.  Allergies:  Allergies  Allergen Reactions  . Codeine Nausea And Vomiting    Medications Prior to Admission  Medication Sig Dispense Refill  . albuterol (PROVENTIL HFA;VENTOLIN HFA) 108 (90 Base) MCG/ACT inhaler Inhale 2 puffs into the lungs every 6 (six) hours as needed for wheezing or shortness of breath.    . gabapentin (NEURONTIN)  300 MG capsule Take 300 mg by mouth 3 (three) times daily.     Marland Kitchen ibuprofen (ADVIL,MOTRIN) 200 MG tablet Take 800 mg by mouth every 6 (six) hours as needed for headache or moderate pain.     . meloxicam (MOBIC) 15 MG tablet Take 15 mg by mouth daily.    . naproxen sodium (ANAPROX) 220 MG tablet Take 440 mg by mouth daily as needed (for pain or headache).     . oxyCODONE-acetaminophen (PERCOCET) 10-325  MG tablet Take 1 tablet by mouth every 4 (four) hours as needed for pain. 60 tablet 0  . ranitidine (ZANTAC) 75 MG tablet Take 150 mg by mouth daily as needed for heartburn.     . Testosterone (ANDROGEL PUMP) 20.25 MG/ACT (1.62%) GEL Place 4 Pump onto the skin 2 (two) times a week.    Marland Kitchen tiZANidine (ZANAFLEX) 2 MG tablet Take 2 mg by mouth 3 (three) times daily as needed for muscle spasms.    . cyclobenzaprine (FLEXERIL) 10 MG tablet Take 1 tablet (10 mg total) by mouth 3 (three) times daily as needed for muscle spasms. (Patient not taking: Reported on 11/18/2017) 60 tablet 2    No results found for this or any previous visit (from the past 48 hour(s)). No results found.  Review of Systems  All other systems reviewed and are negative.   Blood pressure (!) 150/72, pulse (!) 56, temperature 97.9 F (36.6 C), temperature source Oral, resp. rate 20, height 6' (1.829 m), weight 170 lb (77.1 kg), SpO2 98 %. Physical Exam  On examination patient has an antalgic gait he has a palpable dorsalis pedis pulse there is no redness no cellulitis no open wounds no drainage no signs of infection. Assessment/Plan Assessment: Subtalar fibrous nonunion is status post calcaneal fracture on the right with traumatic arthritis of the right ankle.  Plan: We will plan for tibial calcaneal fusion.  Risk and benefits were discussed including risk of infection neurovascular injury nonhealing need for additional surgery.  Patient states he understands and wishes to proceed at this time.  Newt Minion, MD 11/27/2017, 8:20 AM

## 2017-11-27 NOTE — Transfer of Care (Signed)
Immediate Anesthesia Transfer of Care Note  Patient: Bradley Espinoza  Procedure(s) Performed: RIGHT TIBIOCALCANEAL FUSION (Right )  Patient Location: PACU  Anesthesia Type:GA combined with regional for post-op pain  Level of Consciousness: drowsy and patient cooperative  Airway & Oxygen Therapy: Patient Spontanous Breathing and Patient connected to face mask oxygen  Post-op Assessment: Report given to RN and Post -op Vital signs reviewed and stable  Post vital signs: Reviewed and stable  Last Vitals:  Vitals:   11/27/17 1000 11/27/17 1005  BP: (!) 156/76 (!) 168/82  Pulse: (!) 55 (!) 53  Resp: 11 17  Temp:    SpO2: 99% 99%    Last Pain:  Vitals:   11/27/17 0831  TempSrc:   PainSc: 8       Patients Stated Pain Goal: 8 (46/96/29 5284)  Complications: No apparent anesthesia complications

## 2017-11-27 NOTE — Plan of Care (Signed)
  Activity: Risk for activity intolerance will decrease 11/27/2017 1642 - Progressing by [place] Che, RN   Nutrition: Adequate nutrition will be maintained 11/27/2017 1642 - Progressing by [place] Che, RN   Elimination: Will not experience complications related to bowel motility 11/27/2017 1642 - Progressing by [place] Che, RN   Pain Managment: General experience of comfort will improve 11/27/2017 1642 - Progressing by [place] Che, RN   Safety: Ability to remain free from injury will improve 11/27/2017 1642 - Progressing by [place] Che, RN

## 2017-11-28 ENCOUNTER — Encounter (HOSPITAL_COMMUNITY): Payer: Self-pay | Admitting: Orthopedic Surgery

## 2017-11-28 DIAGNOSIS — M87874 Other osteonecrosis, right foot: Secondary | ICD-10-CM | POA: Diagnosis not present

## 2017-11-28 NOTE — Progress Notes (Signed)
Patient ID: Bradley Espinoza, male   DOB: [birth year], 62 y.o.   MRN: [id number] Postoperative day 1 right tibial calcaneal fusion.  Patient states he tried to get up last night without his fracture boot on he still had an active regional block and he fell.  There is no signs of malalignment of the ankle the dressing is intact the wound VAC is functioning well.  Discussed with the patient he is not allowed to get up without the fracture boot in place and he may be touchdown weightbearing for balance but he is not to be weightbearing for a month.  Patient states the block is still working.  Will evaluate for discharge to home on Friday.

## 2017-11-28 NOTE — Progress Notes (Signed)
Pt reported to RN in the morning that he had a fall overnight with no one present. Pt reported not hitting his head. Vitals stable, IV intact, Prevena working well. He said he stood by the side of the bed and "wanted to see if he could stand on his good leg." Pt medicated for pain. Day shift made aware. BP 154/85 HR 62 RR 18 O2 99

## 2017-11-28 NOTE — Evaluation (Signed)
Physical Therapy Evaluation Patient Details Name: Bradley Espinoza MRN: [id number] DOB: Sep 14, 1956 Today's Date: 11/28/2017   History of Present Illness  62 yo male with onset of nonunion of calcaneal fracture and avascular necrosis of talus was admitted.  Surgery 2/20 for fusion tib-calcaneus.  PMHx:  ankle fusion RLE 2016, anterior lat lumbar fusion, cervical spine surgery, R foot hardware removal, GERD, depression, B ankle fractures  Clinical Impression  Pt is up to walk with PT to door and back, assisted with cues for safety and maneuvering in a crowded environment.  He is expecting to go home with minimal help, but talked with him about using HHPT for follow up vs outpatient.  He is not interested at this time but needs the assistance.  Will try stairs with crutches if time allows tomorrow, but pt is very familiar with the use of the device.    Follow Up Recommendations Home health PT;Supervision for mobility/OOB    Equipment Recommendations  Rolling walker with 5" wheels(unless his is in good repair)    Recommendations for Other Services       Precautions / Restrictions Precautions Precautions: Fall Restrictions Weight Bearing Restrictions: Yes RLE Weight Bearing: Non weight bearing      Mobility  Bed Mobility Overal bed mobility: Modified Independent             General bed mobility comments: poor insight to safety, sat up with vac on R ankle and LLE compression device all attached and tangled  Transfers Overall transfer level: Needs assistance Equipment used: Rolling walker (2 wheeled);1 person hand held assist Transfers: Sit to/from Stand Sit to Stand: Min guard;From elevated surface(with reminders for hand placement which pt did not follow)            Ambulation/Gait Ambulation/Gait assistance: Min guard(cued safety with cam boot) Ambulation Distance (Feet): 35 Feet Assistive device: Rolling walker (2 wheeled);1 person hand held assist   Gait  velocity: reduced Gait velocity interpretation: Below normal speed for age/gender General Gait Details: hopping on LLE with difficulty given the L ankle issues of chronic fracture  Stairs Stairs: (deferred)          Wheelchair Mobility    Modified Rankin (Stroke Patients Only)       Balance Overall balance assessment: History of Falls                                           Pertinent Vitals/Pain Pain Assessment: Faces Faces Pain Scale: Hurts worst Pain Location: back Pain Descriptors / Indicators: Sore;Spasm;Guarding Pain Intervention(s): Monitored during session;Premedicated before session;Repositioned    Home Living Family/patient expects to be discharged to:: Private residence Living Arrangements: Spouse/significant other Available Help at Discharge: Friend(s);Available PRN/intermittently Type of Home: House Home Access: Stairs to enter Entrance Stairs-Rails: Right Entrance Stairs-Number of Steps: 3 Home Layout: One level Home Equipment: Cane - single point;Bedside commode;Shower seat;Grab bars - toilet;Grab bars - tub/shower;Wheelchair - [employer] - 2 wheels Additional Comments: pt states he does not want follow up therapy despite discussion of need    Prior Function Level of Independence: Independent with assistive device(s)         Comments: reports he had been walking prior to surgery on both legs and that the L ankle is next     Hand Dominance   Dominant Hand: Right    Extremity/Trunk Assessment   Upper Extremity Assessment Upper  Extremity Assessment: Overall WFL for tasks assessed    Lower Extremity Assessment Lower Extremity Assessment: RLE deficits/detail;LLE deficits/detail RLE Deficits / Details: new ankle fusion with cam boot RLE: Unable to fully assess due to pain;Unable to fully assess due to immobilization RLE Coordination: decreased fine motor;decreased gross motor LLE Deficits / Details: weakness and pain  from L ankle chronic fracture LLE: Unable to fully assess due to pain;Unable to fully assess due to immobilization LLE Coordination: decreased fine motor;decreased gross motor    Cervical / Trunk Assessment Cervical / Trunk Assessment: Kyphotic  Communication   Communication: No difficulties  Cognition Arousal/Alertness: Awake/alert Behavior During Therapy: Anxious;Impulsive Overall Cognitive Status: No family/caregiver present to determine baseline cognitive functioning                                 General Comments: pt doesnt follow discussion well about his safety issues, interrupts and has poor recall of instructions      General Comments General comments (skin integrity, edema, etc.): R leg has a wound vac with portable unit, dry lines.    Exercises     Assessment/Plan    PT Assessment Patient needs continued PT services  PT Problem List Decreased strength;Decreased range of motion;Decreased activity tolerance;Decreased balance;Decreased mobility;Decreased coordination;Decreased safety awareness;Decreased knowledge of precautions;Decreased skin integrity;Pain       PT Treatment Interventions DME instruction;Gait training;Stair training;Functional mobility training;Therapeutic activities;Therapeutic exercise;Balance training;Neuromuscular re-education;Patient/family education    PT Goals (Current goals can be found in the Care Plan section)  Acute Rehab PT Goals Patient Stated Goal: to go home tomorrow PT Goal Formulation: With patient Time For Goal Achievement: 12/12/17 Potential to Achieve Goals: Good    Frequency Min 4X/week   Barriers to discharge Inaccessible home environment;Decreased caregiver support stairs at home with no caregiver    Co-evaluation               AM-PAC PT "6 Clicks" Daily Activity  Outcome Measure Difficulty turning over in bed (including adjusting bedclothes, sheets and blankets)?: A Little Difficulty moving from  lying on back to sitting on the side of the bed? : A Little Difficulty sitting down on and standing up from a chair with arms (e.g., wheelchair, bedside commode, etc,.)?: Unable Help needed moving to and from a bed to chair (including a wheelchair)?: A Little Help needed walking in hospital room?: A Little Help needed climbing 3-5 steps with a railing? : A Little 6 Click Score: 16    End of Session Equipment Utilized During Treatment: Gait belt Activity Tolerance: No increased pain;Other (comment)(back pain is limiting more so than R ankle) Patient left: with call bell/phone within reach;in chair;with chair alarm set Nurse Communication: Mobility status;Other (comment)(discussed pt attending poorly to instructions) PT Visit Diagnosis: Unsteadiness on feet (R26.81);Muscle weakness (generalized) (M62.81);Difficulty in walking, not elsewhere classified (R26.2);Pain Pain - Right/Left: Right Pain - part of body: Ankle and joints of foot    Time: 0938-1829 PT Time Calculation (min) (ACUTE ONLY): 39 min   Charges:   PT Evaluation $PT Eval Low Complexity: 1 Low PT Treatments $Gait Training: 8-22 mins $Therapeutic Activity: 8-22 mins   PT G Codes:   PT G-Codes **NOT FOR INPATIENT CLASS** Functional Assessment Tool Used: AM-PAC 6 Clicks Basic Mobility    Ramond Dial 11/28/2017, 12:44 PM   Mee Hives, PT MS Acute Rehab Dept. Number: [place] and [place]

## 2017-11-29 ENCOUNTER — Telehealth (INDEPENDENT_AMBULATORY_CARE_PROVIDER_SITE_OTHER): Payer: Self-pay | Admitting: Orthopedic Surgery

## 2017-11-29 DIAGNOSIS — M87874 Other osteonecrosis, right foot: Secondary | ICD-10-CM | POA: Diagnosis not present

## 2017-11-29 MED ORDER — OXYCODONE-ACETAMINOPHEN 5-325 MG PO TABS: 1.0000 | ORAL_TABLET | ORAL | 0 refills | Status: DC | PRN

## 2017-11-29 NOTE — Progress Notes (Signed)
Pt agitated about the bed alarm going off each time he tries to move in his bed.  Bed alarm is on the least sensitive setting and pt informed that the alarm goes off when he lifts off the bed too far.  Pt angry and argumentative with staff.  Pt educated on the need to maintain bed alarm. Pt also reminded of his fall on yesterday.  Pt getting increasingly agitated and unhappy with his care.  Pt refused bed alarm and was informed that by doing this we are unable to most effectively ensure his safety. Pt voiced understanding and continue to refuse alarm.  Pt agreed to call for assistance if he needs to get out of the bed for any reason.  Will continue to monitor for safety.

## 2017-11-29 NOTE — Progress Notes (Signed)
Pt refused Home health PT and also stated that he knows what to do he has been through this before he just needs his boot (which is in the room) and his prescription and he is good to go.   RN gave patient Discharge instructions. pt states understanding and does not have any questions. Awaiting ride.

## 2017-11-29 NOTE — Telephone Encounter (Signed)
Pt had surgery today with Dr.Duda he was instructed to call the office to sched a appt. I tried to sched appt for Thursday and didn't have availability to sched pt. Pt will call back on Monday to sched his appt.

## 2017-12-02 NOTE — Telephone Encounter (Signed)
Please make a nurse only appt for this pt. I will see him in the office to remove dressing and he will follow up with Dr. Sharol Given the week after that.

## 2017-12-02 NOTE — Telephone Encounter (Signed)
Will do!

## 2017-12-05 ENCOUNTER — Encounter (INDEPENDENT_AMBULATORY_CARE_PROVIDER_SITE_OTHER): Payer: Self-pay

## 2017-12-05 ENCOUNTER — Ambulatory Visit (INDEPENDENT_AMBULATORY_CARE_PROVIDER_SITE_OTHER): Payer: PPO

## 2017-12-05 VITALS — Ht 72.0 in | Wt 170.0 lb

## 2017-12-05 DIAGNOSIS — Z981 Arthrodesis status: Secondary | ICD-10-CM

## 2017-12-05 NOTE — Progress Notes (Signed)
Patient in office today for 1 week post op Prevena vac removal. Patient is non weight bearing in wheelchair with a fracture boot. The pt states that the vac stopped working yesterday and he disconnected the tubing from the dressing. The skin was macerated. Incision well approximated with no redness and minimal swelling. Advised pt to wash the leg with dial soap and water daily and apply ABD and ace bandage daily. He will follow up in one week with Dr. Sharol Given for evaluation and X-rays. Pt voiced understanding and was given supplies for the next few days. He will call with questions.    Lateefa Crosby, [place], [employer]

## 2017-12-12 ENCOUNTER — Encounter (INDEPENDENT_AMBULATORY_CARE_PROVIDER_SITE_OTHER): Payer: Self-pay | Admitting: Orthopedic Surgery

## 2017-12-12 ENCOUNTER — Ambulatory Visit (INDEPENDENT_AMBULATORY_CARE_PROVIDER_SITE_OTHER): Payer: PPO | Admitting: Orthopedic Surgery

## 2017-12-12 ENCOUNTER — Ambulatory Visit (INDEPENDENT_AMBULATORY_CARE_PROVIDER_SITE_OTHER): Payer: PPO

## 2017-12-12 VITALS — Ht 72.0 in | Wt 170.0 lb

## 2017-12-12 DIAGNOSIS — M25571 Pain in right ankle and joints of right foot: Secondary | ICD-10-CM | POA: Diagnosis not present

## 2017-12-12 DIAGNOSIS — Z981 Arthrodesis status: Secondary | ICD-10-CM

## 2017-12-12 MED ORDER — OXYCODONE-ACETAMINOPHEN 5-325 MG PO TABS: 1.0000 | ORAL_TABLET | ORAL | 0 refills | Status: DC | PRN

## 2017-12-12 NOTE — Progress Notes (Signed)
Office Visit Note   Patient: Bradley Espinoza           Date of Birth: 05/05/1956           MRN: [id number] Visit Date: 12/12/2017              Requested by: Celene Squibb, MD [place], [place] [address] PCP: Celene Squibb, MD  Chief Complaint  Patient presents with  . Right Ankle - Routine Post Op    11/27/17 right tibiocalcaneal fusion      HPI: Vision presents follow-up 2 weeks status post right tibial calcaneal fusion and placement of antibiotic beads patient states he is ambulating without his fracture boot in place he fell but states he does not feel he injured his foot.  Assessment & Plan: Visit Diagnoses:  1. Pain in right ankle and joints of right foot   2. H/O ankle fusion     Plan: Patient is given a refill prescription for Percocet he was instructed to wear the fracture boot at all times when he is up and ambulating.  Wash his foot with Dial soap and water and dry dressing change daily follow-up in 1 week to remove the sutures.  Follow-Up Instructions: Return in about 1 week (around 12/19/2017).   Ortho Exam  Patient is alert, oriented, no adenopathy, well-dressed, normal affect, normal respiratory effort. Examination the incisions are well approximated there is some drainage from the lateral incision of clear drainage which is most likely the resolution of the antibiotic beads.  There is no cellulitis no tenderness to palpation there is some bruising of the skin medially.  Imaging: Xr Ankle Complete Right  Result Date: 12/12/2017 2 view radiographs of the right ankle shows stable tibial calcaneal fusion the antibiotic beads are in place.  No images are attached to the encounter.  Labs: Lab Results  Component Value Date   HGBA1C 5.1 06/26/2016   REPTSTATUS 04/10/2017 FINAL 04/05/2017   GRAMSTAIN  04/05/2017    FEW WBC PRESENT, PREDOMINANTLY PMN NO ORGANISMS SEEN    CULT No growth aerobically or anaerobically. 04/05/2017     @LABSALLVALUES (HGBA1)@  Body mass index is 23.06 kg/m.  Orders:  Orders Placed This Encounter  Procedures  . XR Ankle Complete Right   Meds ordered this encounter  Medications  . oxyCODONE-acetaminophen (PERCOCET/ROXICET) 5-325 MG tablet    Sig: Take 1 tablet by mouth every 4 (four) hours as needed for severe pain.    Dispense:  30 tablet    Refill:  0     Procedures: No procedures performed  Clinical Data: No additional findings.  ROS:  All other systems negative, except as noted in the HPI. Review of Systems  Objective: Vital Signs: Ht 6' (1.829 m)   Wt 170 lb (77.1 kg)   BMI 23.06 kg/m   Specialty Comments:  No specialty comments available.  PMFS History: Patient Active Problem List   Diagnosis Date Noted  . S/P ankle fusion 11/27/2017  . Nonunion of subtalar arthrodesis   . Avascular necrosis of talus, right ([place])   . Post-traumatic osteoarthritis, left ankle and foot 10/22/2017  . DDD (degenerative disc disease), lumbar 04/05/2017  . Fracture of L2 vertebra ([place]) 01/12/2015  . Acute osteomyelitis of calcaneum ([place]) 10/09/2014  . Dysuria 10/05/2014  . Cellulitis 10/05/2014  . Fall from ladder 09/21/2014  . L2 vertebral fracture ([place]) 09/21/2014  . Bilateral calcaneal fractures 09/21/2014  . Chronic pain 09/21/2014  . Acute blood  loss anemia 09/21/2014  . Open fracture of both calcanei 09/15/2014   Past Medical History:  Diagnosis Date  . Anxiety   . Arthritis    low back pain, lumbar radiculopathy  . Depression   . Fracture    B/L ankles  . GERD (gastroesophageal reflux disease)   . Headache(784.0)    allergy related   . History of kidney stones   . History of stomach ulcers   . Retained orthopedic hardware    failed retained hardware right foot  . Wears glasses     Family History  Problem Relation Age of Onset  . Diabetes Father   . Cancer Other     Past Surgical History:  Procedure Laterality Date  . ANKLE FUSION Right  05/11/2015   Procedure: Right Posterior Arthroscopic Subtalar Arthrodesis;  Surgeon: Newt Minion, MD;  Location: [facility];  Service: Orthopedics;  Laterality: Right;  . ANKLE FUSION Right 11/27/2017   Procedure: RIGHT TIBIOCALCANEAL FUSION;  Surgeon: Newt Minion, MD;  Location: [facility];  Service: Orthopedics;  Laterality: Right;  . ANTERIOR LAT LUMBAR FUSION N/A 04/05/2017   Procedure: Extreme Lateral Interbody Fusion - Lumbar three-lumbar four ,exploration of fusion Posterior augmentation with globus addition Removal hardware Lumbar one-three. Lumbar four-sacral one,  Removal internal bone growth stimulator;  Surgeon: Kary Kos, MD;  Location: [facility];  Service: Neurosurgery;  Laterality: N/A;  . BACK SURGERY  2004   x 2  . CERVICAL SPINE SURGERY  2008  . ESOPHAGOGASTRODUODENOSCOPY    . HARDWARE REMOVAL Right 10/09/2014   Procedure: Removal Deep Hardware, Irrigation and Debridement Calcaneus, Place Antibiotic Beads and Wound VAC ;  Surgeon: Newt Minion, MD;  Location: [facility];  Service: Orthopedics;  Laterality: Right;  . HARDWARE REMOVAL Right 08/12/2015   Procedure: Removal Deep Hardware Right Foot;  Surgeon: Newt Minion, MD;  Location: [facility];  Service: Orthopedics;  Laterality: Right;  . I&D EXTREMITY Right 09/15/2014   Procedure: IRRIGATION AND DEBRIDEMENT Ankle;  Surgeon: Renette Butters, MD;  Location: [facility];  Service: Orthopedics;  Laterality: Right;  . INGUINAL HERNIA REPAIR Bilateral   . LUMBAR PERCUTANEOUS PEDICLE SCREW 1 LEVEL N/A 04/05/2017   Procedure: LUMBAR PERCUTANEOUS PEDICLE SCREW LUMBAR THREE-FOUR;  Surgeon: Kary Kos, MD;  Location: [facility];  Service: Neurosurgery;  Laterality: N/A;  . ORIF CALCANEOUS FRACTURE Right 09/19/2014   Procedure: OPEN REDUCTION INTERNAL FIXATION (ORIF) CALCANEOUS FRACTURE;  Surgeon: Newt Minion, MD;  Location: [facility];  Service: Orthopedics;  Laterality: Right;  . ORIF CALCANEOUS FRACTURE Left 09/19/2014   Procedure: OPEN REDUCTION INTERNAL FIXATION  (ORIF) CALCANEOUS FRACTURE;  Surgeon: Newt Minion, MD;  Location: [facility];  Service: Orthopedics;  Laterality: Left;   Social History   Occupational History    Comment: disabled  Tobacco Use  . Smoking status: Current Some Day Smoker    Packs/day: 1.00    Years: 10.00    Pack years: 10.00    Types: Cigars    Start date: 10/08/1976    Last attempt to quit: 09/07/2014    Years since quitting: 3.2  . Smokeless tobacco: Never Used  . Tobacco comment: 06/26/16 reports smokes a cigar 2 times a week at the most  Substance and Sexual Activity  . Alcohol use: No    Comment: 06/26/16 quit 1998  . Drug use: No  . Sexual activity: Yes    Birth control/protection: None

## 2017-12-19 ENCOUNTER — Encounter (INDEPENDENT_AMBULATORY_CARE_PROVIDER_SITE_OTHER): Payer: Self-pay | Admitting: Orthopedic Surgery

## 2017-12-19 ENCOUNTER — Ambulatory Visit (INDEPENDENT_AMBULATORY_CARE_PROVIDER_SITE_OTHER): Payer: PPO | Admitting: Orthopedic Surgery

## 2017-12-19 VITALS — Ht 72.0 in | Wt 170.0 lb

## 2017-12-19 DIAGNOSIS — Z981 Arthrodesis status: Secondary | ICD-10-CM

## 2017-12-19 NOTE — Progress Notes (Signed)
Office Visit Note   Patient: Bradley Espinoza           Date of Birth: [birth year]           MRN: [id number] Visit Date: 12/19/2017              Requested by: Celene Squibb, MD [place], [place] [address] PCP: Celene Squibb, MD  Chief Complaint  Patient presents with  . Right Ankle - Routine Post Op    11/27/17 right tib-cal fusion      HPI: Patient is a 62 year old gentleman who presents follow-up status post tibial calcaneal fusion on the right.  Patient states that he has to go out and do chores such as feeding the dog cats animals.  After he goes out to do his activities he has increasing pain and swelling.  Assessment & Plan: Visit Diagnoses:  1. H/O ankle fusion     Plan: Recommended the importance of minimizing his activities.  Have recommended pursuing a short leg cast however with the patient's increased swelling I am concerned that we would have to cut the cast off once he is up once to go out and do his activities.  Discussed the importance of trying to minimize his activities if possible follow-up next week to harvest the sutures today if we are not doing better by next week we would need to place him in a short leg cast.  Follow-Up Instructions: Return in about 1 week (around 12/26/2017).   Ortho Exam  Patient is alert, oriented, no adenopathy, well-dressed, normal affect, normal respiratory effort. Examination patient's foot is plantigrade he does have swelling of the foot ankle and calf.  The incisions are well-healed the sutures are harvested.  4 x 4 and Ace wrap was applied he was placed back in his fracture boot.  Imaging: No results found. No images are attached to the encounter.  Labs: Lab Results  Component Value Date   HGBA1C 5.1 06/26/2016   REPTSTATUS 04/10/2017 FINAL 04/05/2017   GRAMSTAIN  04/05/2017    FEW WBC PRESENT, PREDOMINANTLY PMN NO ORGANISMS SEEN    CULT No growth aerobically or anaerobically. 04/05/2017     @LABSALLVALUES (HGBA1)@  Body mass index is 23.06 kg/m.  Orders:  No orders of the defined types were placed in this encounter.  No orders of the defined types were placed in this encounter.    Procedures: No procedures performed  Clinical Data: No additional findings.  ROS:  All other systems negative, except as noted in the HPI. Review of Systems  Objective: Vital Signs: Ht 6' (1.829 m)   Wt 170 lb (77.1 kg)   BMI 23.06 kg/m   Specialty Comments:  No specialty comments available.  PMFS History: Patient Active Problem List   Diagnosis Date Noted  . S/P ankle fusion 11/27/2017  . Nonunion of subtalar arthrodesis   . Avascular necrosis of talus, right ([place])   . Post-traumatic osteoarthritis, left ankle and foot 10/22/2017  . DDD (degenerative disc disease), lumbar 04/05/2017  . Fracture of L2 vertebra ([facility]) 01/12/2015  . Acute osteomyelitis of calcaneum ([facility]) 10/09/2014  . Dysuria 10/05/2014  . Cellulitis 10/05/2014  . Fall from ladder 09/21/2014  . L2 vertebral fracture ([facility]) 09/21/2014  . Bilateral calcaneal fractures 09/21/2014  . Chronic pain 09/21/2014  . Acute blood loss anemia 09/21/2014  . Open fracture of both calcanei 09/15/2014   Past Medical History:  Diagnosis Date  . Anxiety   . Arthritis  low back pain, lumbar radiculopathy  . Depression   . Fracture    B/L ankles  . GERD (gastroesophageal reflux disease)   . Headache(784.0)    allergy related   . History of kidney stones   . History of stomach ulcers   . Retained orthopedic hardware    failed retained hardware right foot  . Wears glasses     Family History  Problem Relation Age of Onset  . Diabetes Father   . Cancer Other     Past Surgical History:  Procedure Laterality Date  . ANKLE FUSION Right 05/11/2015   Procedure: Right Posterior Arthroscopic Subtalar Arthrodesis;  Surgeon: Newt Minion, MD;  Location: [facility];  Service: Orthopedics;  Laterality: Right;  . ANKLE  FUSION Right 11/27/2017   Procedure: RIGHT TIBIOCALCANEAL FUSION;  Surgeon: Newt Minion, MD;  Location: [facility];  Service: Orthopedics;  Laterality: Right;  . ANTERIOR LAT LUMBAR FUSION N/A 04/05/2017   Procedure: Extreme Lateral Interbody Fusion - Lumbar three-lumbar four ,exploration of fusion Posterior augmentation with globus addition Removal hardware Lumbar one-three. Lumbar four-sacral one,  Removal internal bone growth stimulator;  Surgeon: Kary Kos, MD;  Location: [facility];  Service: Neurosurgery;  Laterality: N/A;  . BACK SURGERY  2004   x 2  . CERVICAL SPINE SURGERY  2008  . ESOPHAGOGASTRODUODENOSCOPY    . HARDWARE REMOVAL Right 10/09/2014   Procedure: Removal Deep Hardware, Irrigation and Debridement Calcaneus, Place Antibiotic Beads and Wound VAC ;  Surgeon: Newt Minion, MD;  Location: [facility];  Service: Orthopedics;  Laterality: Right;  . HARDWARE REMOVAL Right 08/12/2015   Procedure: Removal Deep Hardware Right Foot;  Surgeon: Newt Minion, MD;  Location: [facility];  Service: Orthopedics;  Laterality: Right;  . I&D EXTREMITY Right 09/15/2014   Procedure: IRRIGATION AND DEBRIDEMENT Ankle;  Surgeon: Renette Butters, MD;  Location: [facility];  Service: Orthopedics;  Laterality: Right;  . INGUINAL HERNIA REPAIR Bilateral   . LUMBAR PERCUTANEOUS PEDICLE SCREW 1 LEVEL N/A 04/05/2017   Procedure: LUMBAR PERCUTANEOUS PEDICLE SCREW LUMBAR THREE-FOUR;  Surgeon: Kary Kos, MD;  Location: [facility];  Service: Neurosurgery;  Laterality: N/A;  . ORIF CALCANEOUS FRACTURE Right 09/19/2014   Procedure: OPEN REDUCTION INTERNAL FIXATION (ORIF) CALCANEOUS FRACTURE;  Surgeon: Newt Minion, MD;  Location: [facility];  Service: Orthopedics;  Laterality: Right;  . ORIF CALCANEOUS FRACTURE Left 09/19/2014   Procedure: OPEN REDUCTION INTERNAL FIXATION (ORIF) CALCANEOUS FRACTURE;  Surgeon: Newt Minion, MD;  Location: [facility];  Service: Orthopedics;  Laterality: Left;   Social History   Occupational History    Comment:  disabled  Tobacco Use  . Smoking status: Current Some Day Smoker    Packs/day: 1.00    Years: 10.00    Pack years: 10.00    Types: Cigars    Start date: 10/08/1976    Last attempt to quit: 09/07/2014    Years since quitting: 3.2  . Smokeless tobacco: Never Used  . Tobacco comment: 06/26/16 reports smokes a cigar 2 times a week at the most  Substance and Sexual Activity  . Alcohol use: No    Comment: 06/26/16 quit 1998  . Drug use: No  . Sexual activity: Yes    Birth control/protection: None

## 2017-12-26 ENCOUNTER — Ambulatory Visit (INDEPENDENT_AMBULATORY_CARE_PROVIDER_SITE_OTHER): Payer: PPO

## 2017-12-26 ENCOUNTER — Encounter (HOSPITAL_COMMUNITY): Payer: Self-pay | Admitting: Orthopedic Surgery

## 2017-12-26 ENCOUNTER — Ambulatory Visit (INDEPENDENT_AMBULATORY_CARE_PROVIDER_SITE_OTHER): Payer: PPO | Admitting: Orthopedic Surgery

## 2017-12-26 VITALS — Ht 73.0 in | Wt 170.0 lb

## 2017-12-26 DIAGNOSIS — M25571 Pain in right ankle and joints of right foot: Secondary | ICD-10-CM

## 2017-12-26 DIAGNOSIS — Z981 Arthrodesis status: Secondary | ICD-10-CM

## 2017-12-26 NOTE — Progress Notes (Signed)
Office Visit Note   Patient: Bradley Espinoza           Date of Birth: 08-25-1956           MRN: [id number] Visit Date: 12/26/2017              Requested by: Celene Squibb, MD [place], [place] [address] PCP: Celene Squibb, MD  Chief Complaint  Patient presents with  . Right Ankle - Routine Post Op    11/27/17 right tib-cal fusion       HPI: Patient is a 62 year old gentleman status post tibial calcaneal fusion on the right with placement of antibiotic beads.  Patient complains of pain with wearing the fracture boot pain over the medial malleolus.  He is currently ambulating with crutches in the fracture boot.  Assessment & Plan: Visit Diagnoses:  1. Pain in right ankle and joints of right foot   2. H/O ankle fusion     Plan: The medial malleolus is impinging on the fracture boot due to the Ace wrap the extra padding and the narrowness of the fracture boot.  The Ace wrap was removed a regular sock was applied the padding was cut out from the area of impingement as well as the wrap.  After reapplying the boot patient states his pain completely resolved.  Follow-Up Instructions: Return in about 3 weeks (around 01/16/2018).   Ortho Exam  Patient is alert, oriented, no adenopathy, well-dressed, normal affect, normal respiratory effort. Examination patient has no ulcers medially or laterally there is no cellulitis no drainage no signs of infection his foot is plantigrade.  Patient complains of pain over the medial malleolus from it being squeezed in the fracture boot.  Imaging: Xr Ankle Complete Right  Result Date: 12/26/2017 2 view radiographs of the right ankle shows stable alignment in both AP and lateral planes no hardware failure no complicating features no callus formation and there are still antibiotic beads in place.  No images are attached to the encounter.  Labs: Lab Results  Component Value Date   HGBA1C 5.1 06/26/2016   REPTSTATUS 04/10/2017  FINAL 04/05/2017   GRAMSTAIN  04/05/2017    FEW WBC PRESENT, PREDOMINANTLY PMN NO ORGANISMS SEEN    CULT No growth aerobically or anaerobically. 04/05/2017    @LABSALLVALUES (HGBA1)@  Body mass index is 22.43 kg/m.  Orders:  Orders Placed This Encounter  Procedures  . XR Ankle Complete Right   No orders of the defined types were placed in this encounter.    Procedures: No procedures performed  Clinical Data: No additional findings.  ROS:  All other systems negative, except as noted in the HPI. Review of Systems  Objective: Vital Signs: Ht 6\' 1"  (1.854 m)   Wt 170 lb (77.1 kg)   BMI 22.43 kg/m   Specialty Comments:  No specialty comments available.  PMFS History: Patient Active Problem List   Diagnosis Date Noted  . S/P ankle fusion 11/27/2017  . Nonunion of subtalar arthrodesis   . Avascular necrosis of talus, right ([facility])   . Post-traumatic osteoarthritis, left ankle and foot 10/22/2017  . DDD (degenerative disc disease), lumbar 04/05/2017  . Fracture of L2 vertebra ([facility]) 01/12/2015  . Acute osteomyelitis of calcaneum ([facility]) 10/09/2014  . Dysuria 10/05/2014  . Cellulitis 10/05/2014  . Fall from ladder 09/21/2014  . L2 vertebral fracture ([facility]) 09/21/2014  . Bilateral calcaneal fractures 09/21/2014  . Chronic pain 09/21/2014  . Acute blood loss anemia 09/21/2014  .  Open fracture of both calcanei 09/15/2014   Past Medical History:  Diagnosis Date  . Anxiety   . Arthritis    low back pain, lumbar radiculopathy  . Depression   . Fracture    B/L ankles  . GERD (gastroesophageal reflux disease)   . Headache(784.0)    allergy related   . History of kidney stones   . History of stomach ulcers   . Retained orthopedic hardware    failed retained hardware right foot  . Wears glasses     Family History  Problem Relation Age of Onset  . Diabetes Father   . Cancer Other     Past Surgical History:  Procedure Laterality Date  . ANKLE FUSION Right  05/11/2015   Procedure: Right Posterior Arthroscopic Subtalar Arthrodesis;  Surgeon: Newt Minion, MD;  Location: [facility];  Service: Orthopedics;  Laterality: Right;  . ANKLE FUSION Right 11/27/2017   Procedure: RIGHT TIBIOCALCANEAL FUSION;  Surgeon: Newt Minion, MD;  Location: [facility];  Service: Orthopedics;  Laterality: Right;  . ANTERIOR LAT LUMBAR FUSION N/A 04/05/2017   Procedure: Extreme Lateral Interbody Fusion - Lumbar three-lumbar four ,exploration of fusion Posterior augmentation with globus addition Removal hardware Lumbar one-three. Lumbar four-sacral one,  Removal internal bone growth stimulator;  Surgeon: Kary Kos, MD;  Location: [facility];  Service: Neurosurgery;  Laterality: N/A;  . BACK SURGERY  2004   x 2  . CERVICAL SPINE SURGERY  2008  . ESOPHAGOGASTRODUODENOSCOPY    . HARDWARE REMOVAL Right 10/09/2014   Procedure: Removal Deep Hardware, Irrigation and Debridement Calcaneus, Place Antibiotic Beads and Wound VAC ;  Surgeon: Newt Minion, MD;  Location: [facility];  Service: Orthopedics;  Laterality: Right;  . HARDWARE REMOVAL Right 08/12/2015   Procedure: Removal Deep Hardware Right Foot;  Surgeon: Newt Minion, MD;  Location: [facility];  Service: Orthopedics;  Laterality: Right;  . I&D EXTREMITY Right 09/15/2014   Procedure: IRRIGATION AND DEBRIDEMENT Ankle;  Surgeon: Renette Butters, MD;  Location: [facility];  Service: Orthopedics;  Laterality: Right;  . INGUINAL HERNIA REPAIR Bilateral   . LUMBAR PERCUTANEOUS PEDICLE SCREW 1 LEVEL N/A 04/05/2017   Procedure: LUMBAR PERCUTANEOUS PEDICLE SCREW LUMBAR THREE-FOUR;  Surgeon: Kary Kos, MD;  Location: [facility];  Service: Neurosurgery;  Laterality: N/A;  . ORIF CALCANEOUS FRACTURE Right 09/19/2014   Procedure: OPEN REDUCTION INTERNAL FIXATION (ORIF) CALCANEOUS FRACTURE;  Surgeon: Newt Minion, MD;  Location: [facility];  Service: Orthopedics;  Laterality: Right;  . ORIF CALCANEOUS FRACTURE Left 09/19/2014   Procedure: OPEN REDUCTION INTERNAL FIXATION  (ORIF) CALCANEOUS FRACTURE;  Surgeon: Newt Minion, MD;  Location: [facility];  Service: Orthopedics;  Laterality: Left;   Social History   Occupational History    Comment: disabled  Tobacco Use  . Smoking status: Current Some Day Smoker    Packs/day: 1.00    Years: 10.00    Pack years: 10.00    Types: Cigars    Start date: 10/08/1976    Last attempt to quit: 09/07/2014    Years since quitting: 3.3  . Smokeless tobacco: Never Used  . Tobacco comment: 06/26/16 reports smokes a cigar 2 times a week at the most  Substance and Sexual Activity  . Alcohol use: No    Comment: 06/26/16 quit 1998  . Drug use: No  . Sexual activity: Yes    Birth control/protection: None

## 2018-01-16 ENCOUNTER — Ambulatory Visit (INDEPENDENT_AMBULATORY_CARE_PROVIDER_SITE_OTHER): Payer: PPO | Admitting: Orthopedic Surgery

## 2018-01-28 ENCOUNTER — Encounter (INDEPENDENT_AMBULATORY_CARE_PROVIDER_SITE_OTHER): Payer: Self-pay | Admitting: Orthopedic Surgery

## 2018-01-28 ENCOUNTER — Ambulatory Visit (INDEPENDENT_AMBULATORY_CARE_PROVIDER_SITE_OTHER): Payer: PPO

## 2018-01-28 ENCOUNTER — Ambulatory Visit (INDEPENDENT_AMBULATORY_CARE_PROVIDER_SITE_OTHER): Payer: PPO | Admitting: Orthopedic Surgery

## 2018-01-28 VITALS — Ht 73.0 in | Wt 170.0 lb

## 2018-01-28 DIAGNOSIS — M25571 Pain in right ankle and joints of right foot: Secondary | ICD-10-CM

## 2018-01-28 DIAGNOSIS — M5442 Lumbago with sciatica, left side: Secondary | ICD-10-CM | POA: Diagnosis not present

## 2018-01-28 DIAGNOSIS — S92001P Unspecified fracture of right calcaneus, subsequent encounter for fracture with malunion: Secondary | ICD-10-CM

## 2018-01-28 DIAGNOSIS — R030 Elevated blood-pressure reading, without diagnosis of hypertension: Secondary | ICD-10-CM | POA: Diagnosis not present

## 2018-01-28 DIAGNOSIS — Z981 Arthrodesis status: Secondary | ICD-10-CM

## 2018-01-28 DIAGNOSIS — S92002P Unspecified fracture of left calcaneus, subsequent encounter for fracture with malunion: Secondary | ICD-10-CM

## 2018-01-28 NOTE — Progress Notes (Signed)
xr riht ankle

## 2018-01-28 NOTE — Progress Notes (Signed)
Office Visit Note   Patient: Bradley Espinoza           Date of Birth: Dec 19, 1955           MRN: [id number] Visit Date: 01/28/2018              Requested by: Celene Squibb, MD [place], [place] [address] PCP: Celene Squibb, MD  Chief Complaint  Patient presents with  . Right Ankle - Routine Post Op    11/27/17 right tib-cal fusion       HPI: Patient is a 62 year old gentleman status post tibial calcaneal fusion.  Patient states he has a burning pain at the end of the day.  He states he has been on the Neurontin 300 mg twice a day.  Assessment & Plan: Visit Diagnoses:  1. Pain in right ankle and joints of right foot   2. H/O ankle fusion   3. Open fracture of both calcanei with malunion, subsequent encounter     Plan: Patient is approximately two thirds of the way healed at the fusion site.  Discussed continue protected weightbearing continue with the fracture boot.  Patient states occasionally he walks without assistance.  Discussed that this will slow down the healing with micromotion.  Discussed that he could increase his Neurontin to 300 mg 3 times a day from twice a day to try to help with the burning neuropathic pain.  Plan to repeat three-view radiographs of the right ankle at follow-up.  Follow-Up Instructions: Return in about 1 month (around 02/25/2018).   Ortho Exam  Patient is alert, oriented, no adenopathy, well-dressed, normal affect, normal respiratory effort. Examination patient's foot is plantigrade he has slight valgus alignment of the hindfoot his scars are sensitive to touch there is no redness no cellulitis no swelling no signs of infection.  Patient has good range of motion through the midfoot and forefoot.  There are no blisters or ulcers or calluses. Imaging: No results found. No images are attached to the encounter.  Labs: Lab Results  Component Value Date   HGBA1C 5.1 06/26/2016   REPTSTATUS 04/10/2017 FINAL 04/05/2017   GRAMSTAIN  04/05/2017    FEW WBC PRESENT, PREDOMINANTLY PMN NO ORGANISMS SEEN    CULT No growth aerobically or anaerobically. 04/05/2017    @LABSALLVALUES (HGBA1)@  Body mass index is 22.43 kg/m.  Orders:  Orders Placed This Encounter  Procedures  . XR Ankle Complete Right   No orders of the defined types were placed in this encounter.    Procedures: No procedures performed  Clinical Data: No additional findings.  ROS:  All other systems negative, except as noted in the HPI. Review of Systems  Objective: Vital Signs: Ht 6\' 1"  (1.854 m)   Wt 170 lb (77.1 kg)   BMI 22.43 kg/m   Specialty Comments:  No specialty comments available.  PMFS History: Patient Active Problem List   Diagnosis Date Noted  . S/P ankle fusion 11/27/2017  . Nonunion of subtalar arthrodesis   . Avascular necrosis of talus, right ([place])   . Post-traumatic osteoarthritis, left ankle and foot 10/22/2017  . DDD (degenerative disc disease), lumbar 04/05/2017  . Fracture of L2 vertebra ([place]) 01/12/2015  . Acute osteomyelitis of calcaneum ([place]) 10/09/2014  . Dysuria 10/05/2014  . Cellulitis 10/05/2014  . Fall from ladder 09/21/2014  . L2 vertebral fracture ([place]) 09/21/2014  . Bilateral calcaneal fractures 09/21/2014  . Chronic pain 09/21/2014  . Acute blood loss anemia 09/21/2014  .  Open fracture of both calcanei 09/15/2014   Past Medical History:  Diagnosis Date  . Anxiety   . Arthritis    low back pain, lumbar radiculopathy  . Depression   . Fracture    B/L ankles  . GERD (gastroesophageal reflux disease)   . Headache(784.0)    allergy related   . History of kidney stones   . History of stomach ulcers   . Retained orthopedic hardware    failed retained hardware right foot  . Wears glasses     Family History  Problem Relation Age of Onset  . Diabetes Father   . Cancer Other     Past Surgical History:  Procedure Laterality Date  . ANKLE FUSION Right 05/11/2015   Procedure:  Right Posterior Arthroscopic Subtalar Arthrodesis;  Surgeon: Newt Minion, MD;  Location: [facility];  Service: Orthopedics;  Laterality: Right;  . ANKLE FUSION Right 11/27/2017   Procedure: RIGHT TIBIOCALCANEAL FUSION;  Surgeon: Newt Minion, MD;  Location: [facility];  Service: Orthopedics;  Laterality: Right;  . ANTERIOR LAT LUMBAR FUSION N/A 04/05/2017   Procedure: Extreme Lateral Interbody Fusion - Lumbar three-lumbar four ,exploration of fusion Posterior augmentation with globus addition Removal hardware Lumbar one-three. Lumbar four-sacral one,  Removal internal bone growth stimulator;  Surgeon: Kary Kos, MD;  Location: [facility];  Service: Neurosurgery;  Laterality: N/A;  . BACK SURGERY  2004   x 2  . CERVICAL SPINE SURGERY  2008  . ESOPHAGOGASTRODUODENOSCOPY    . HARDWARE REMOVAL Right 10/09/2014   Procedure: Removal Deep Hardware, Irrigation and Debridement Calcaneus, Place Antibiotic Beads and Wound VAC ;  Surgeon: Newt Minion, MD;  Location: [facility];  Service: Orthopedics;  Laterality: Right;  . HARDWARE REMOVAL Right 08/12/2015   Procedure: Removal Deep Hardware Right Foot;  Surgeon: Newt Minion, MD;  Location: [facility];  Service: Orthopedics;  Laterality: Right;  . I&D EXTREMITY Right 09/15/2014   Procedure: IRRIGATION AND DEBRIDEMENT Ankle;  Surgeon: Renette Butters, MD;  Location: [facility];  Service: Orthopedics;  Laterality: Right;  . INGUINAL HERNIA REPAIR Bilateral   . LUMBAR PERCUTANEOUS PEDICLE SCREW 1 LEVEL N/A 04/05/2017   Procedure: LUMBAR PERCUTANEOUS PEDICLE SCREW LUMBAR THREE-FOUR;  Surgeon: Kary Kos, MD;  Location: [facility];  Service: Neurosurgery;  Laterality: N/A;  . ORIF CALCANEOUS FRACTURE Right 09/19/2014   Procedure: OPEN REDUCTION INTERNAL FIXATION (ORIF) CALCANEOUS FRACTURE;  Surgeon: Newt Minion, MD;  Location: [facility];  Service: Orthopedics;  Laterality: Right;  . ORIF CALCANEOUS FRACTURE Left 09/19/2014   Procedure: OPEN REDUCTION INTERNAL FIXATION (ORIF) CALCANEOUS FRACTURE;   Surgeon: Newt Minion, MD;  Location: [facility];  Service: Orthopedics;  Laterality: Left;   Social History   Occupational History    Comment: disabled  Tobacco Use  . Smoking status: Current Some Day Smoker    Packs/day: 1.00    Years: 10.00    Pack years: 10.00    Types: Cigars    Start date: 10/08/1976    Last attempt to quit: 09/07/2014    Years since quitting: 3.3  . Smokeless tobacco: Never Used  . Tobacco comment: 06/26/16 reports smokes a cigar 2 times a week at the most  Substance and Sexual Activity  . Alcohol use: No    Comment: 06/26/16 quit 1998  . Drug use: No  . Sexual activity: Yes    Birth control/protection: None

## 2018-02-12 DIAGNOSIS — M545 Low back pain: Secondary | ICD-10-CM | POA: Diagnosis not present

## 2018-02-12 DIAGNOSIS — R030 Elevated blood-pressure reading, without diagnosis of hypertension: Secondary | ICD-10-CM | POA: Diagnosis not present

## 2018-02-12 DIAGNOSIS — M5442 Lumbago with sciatica, left side: Secondary | ICD-10-CM | POA: Diagnosis not present

## 2018-02-12 DIAGNOSIS — M5441 Lumbago with sciatica, right side: Secondary | ICD-10-CM | POA: Diagnosis not present

## 2018-02-25 ENCOUNTER — Encounter (INDEPENDENT_AMBULATORY_CARE_PROVIDER_SITE_OTHER): Payer: Self-pay | Admitting: Orthopedic Surgery

## 2018-02-25 ENCOUNTER — Ambulatory Visit (INDEPENDENT_AMBULATORY_CARE_PROVIDER_SITE_OTHER): Payer: PPO

## 2018-02-25 ENCOUNTER — Ambulatory Visit (INDEPENDENT_AMBULATORY_CARE_PROVIDER_SITE_OTHER): Payer: PPO | Admitting: Orthopedic Surgery

## 2018-02-25 VITALS — Ht 73.0 in | Wt 170.0 lb

## 2018-02-25 DIAGNOSIS — Z981 Arthrodesis status: Secondary | ICD-10-CM

## 2018-02-25 DIAGNOSIS — M25571 Pain in right ankle and joints of right foot: Secondary | ICD-10-CM

## 2018-02-25 NOTE — Progress Notes (Signed)
Office Visit Note   Patient: Bradley Espinoza           Date of Birth: 1956/01/23           MRN: [id number] Visit Date: 02/25/2018              Requested by: Celene Squibb, MD [place], [place] [id number] PCP: Celene Squibb, MD  Chief Complaint  Patient presents with  . Right Ankle - Routine Post Op    11/27/17 right tib-cal fusion       HPI: Patient presents in follow-up 3 months status post tibial calcaneal fusion.  Patient states he still has chronic back pain and may go ahead and proceed with back surgery before he does surgery in the left leg he states the Neurontin does help with his neuropathic pain but he states that it does make him sleepy.  Patient states that his knee is using compression socks and states these feel like they are working.  Assessment & Plan: Visit Diagnoses:  1. Pain in right ankle and joints of right foot   2. H/O ankle fusion     Plan: A 9/16 inch heel lift was placed in the boot patient states that this made his legs feel level.  We may need to get a heel lift in the department.  Patient will continue with a fracture boot activities as tolerated with one crutch.  Follow-Up Instructions: Return in about 1 month (around 03/25/2018).   Ortho Exam  Patient is alert, oriented, no adenopathy, well-dressed, normal affect, normal respiratory effort. Examination his foot is plantigrade there is no redness no cellulitis there is minimal swelling.  He has good range of motion through the midfoot with plantar flexion dorsiflexion inversion eversion supination pronation.  The skin incisions are well-healed.  Radiographs show stable alignment.  There are no dystrophic changes.  Imaging: Xr Ankle Complete Right  Result Date: 02/25/2018 2 view radiographs of the right ankle shows stable alignment of both AP and lateral planes no complicating features no loss of reduction no hardware failure  No images are attached to the encounter.  Labs: Lab  Results  Component Value Date   HGBA1C 5.1 06/26/2016   REPTSTATUS 04/10/2017 FINAL 04/05/2017   GRAMSTAIN  04/05/2017    FEW WBC PRESENT, PREDOMINANTLY PMN NO ORGANISMS SEEN    CULT No growth aerobically or anaerobically. 04/05/2017     Lab Results  Component Value Date   ALBUMIN 4.3 08/12/2015   ALBUMIN 4.1 05/06/2015   ALBUMIN 4.0 09/15/2014    Body mass index is 22.43 kg/m.  Orders:  Orders Placed This Encounter  Procedures  . XR Ankle Complete Right   No orders of the defined types were placed in this encounter.    Procedures: No procedures performed  Clinical Data: No additional findings.  ROS:  All other systems negative, except as noted in the HPI. Review of Systems  Objective: Vital Signs: Ht 6\' 1"  (1.854 m)   Wt 170 lb (77.1 kg)   BMI 22.43 kg/m   Specialty Comments:  No specialty comments available.  PMFS History: Patient Active Problem List   Diagnosis Date Noted  . S/P ankle fusion 11/27/2017  . Nonunion of subtalar arthrodesis   . Avascular necrosis of talus, right ([facility])   . Post-traumatic osteoarthritis, left ankle and foot 10/22/2017  . DDD (degenerative disc disease), lumbar 04/05/2017  . Fracture of L2 vertebra ([place]) 01/12/2015  . Acute osteomyelitis of calcaneum (  [place]) 10/09/2014  . Dysuria 10/05/2014  . Cellulitis 10/05/2014  . Fall from ladder 09/21/2014  . L2 vertebral fracture ([facility]) 09/21/2014  . Bilateral calcaneal fractures 09/21/2014  . Chronic pain 09/21/2014  . Acute blood loss anemia 09/21/2014  . Open fracture of both calcanei 09/15/2014   Past Medical History:  Diagnosis Date  . Anxiety   . Arthritis    low back pain, lumbar radiculopathy  . Depression   . Fracture    B/L ankles  . GERD (gastroesophageal reflux disease)   . Headache(784.0)    allergy related   . History of kidney stones   . History of stomach ulcers   . Retained orthopedic hardware    failed retained hardware right foot  . Wears glasses      Family History  Problem Relation Age of Onset  . Diabetes Father   . Cancer Other     Past Surgical History:  Procedure Laterality Date  . ANKLE FUSION Right 05/11/2015   Procedure: Right Posterior Arthroscopic Subtalar Arthrodesis;  Surgeon: Newt Minion, MD;  Location: [facility];  Service: Orthopedics;  Laterality: Right;  . ANKLE FUSION Right 11/27/2017   Procedure: RIGHT TIBIOCALCANEAL FUSION;  Surgeon: Newt Minion, MD;  Location: [facility];  Service: Orthopedics;  Laterality: Right;  . ANTERIOR LAT LUMBAR FUSION N/A 04/05/2017   Procedure: Extreme Lateral Interbody Fusion - Lumbar three-lumbar four ,exploration of fusion Posterior augmentation with globus addition Removal hardware Lumbar one-three. Lumbar four-sacral one,  Removal internal bone growth stimulator;  Surgeon: Kary Kos, MD;  Location: [facility];  Service: Neurosurgery;  Laterality: N/A;  . BACK SURGERY  2004   x 2  . CERVICAL SPINE SURGERY  2008  . ESOPHAGOGASTRODUODENOSCOPY    . HARDWARE REMOVAL Right 10/09/2014   Procedure: Removal Deep Hardware, Irrigation and Debridement Calcaneus, Place Antibiotic Beads and Wound VAC ;  Surgeon: Newt Minion, MD;  Location: [facility];  Service: Orthopedics;  Laterality: Right;  . HARDWARE REMOVAL Right 08/12/2015   Procedure: Removal Deep Hardware Right Foot;  Surgeon: Newt Minion, MD;  Location: [facility];  Service: Orthopedics;  Laterality: Right;  . I&D EXTREMITY Right 09/15/2014   Procedure: IRRIGATION AND DEBRIDEMENT Ankle;  Surgeon: Renette Butters, MD;  Location: [facility];  Service: Orthopedics;  Laterality: Right;  . INGUINAL HERNIA REPAIR Bilateral   . LUMBAR PERCUTANEOUS PEDICLE SCREW 1 LEVEL N/A 04/05/2017   Procedure: LUMBAR PERCUTANEOUS PEDICLE SCREW LUMBAR THREE-FOUR;  Surgeon: Kary Kos, MD;  Location: [facility];  Service: Neurosurgery;  Laterality: N/A;  . ORIF CALCANEOUS FRACTURE Right 09/19/2014   Procedure: OPEN REDUCTION INTERNAL FIXATION (ORIF) CALCANEOUS FRACTURE;  Surgeon: Newt Minion, MD;  Location: [facility];  Service: Orthopedics;  Laterality: Right;  . ORIF CALCANEOUS FRACTURE Left 09/19/2014   Procedure: OPEN REDUCTION INTERNAL FIXATION (ORIF) CALCANEOUS FRACTURE;  Surgeon: Newt Minion, MD;  Location: [facility];  Service: Orthopedics;  Laterality: Left;   Social History   Occupational History    Comment: disabled  Tobacco Use  . Smoking status: Current Some Day Smoker    Packs/day: 1.00    Years: 10.00    Pack years: 10.00    Types: Cigars    Start date: 10/08/1976    Last attempt to quit: 09/07/2014    Years since quitting: 3.4  . Smokeless tobacco: Never Used  . Tobacco comment: 06/26/16 reports smokes a cigar 2 times a week at the most  Substance and Sexual Activity  .  Alcohol use: No    Comment: 06/26/16 quit 1998  . Drug use: No  . Sexual activity: Yes    Birth control/protection: None

## 2018-03-25 ENCOUNTER — Ambulatory Visit (INDEPENDENT_AMBULATORY_CARE_PROVIDER_SITE_OTHER): Payer: PPO

## 2018-03-25 ENCOUNTER — Encounter (INDEPENDENT_AMBULATORY_CARE_PROVIDER_SITE_OTHER): Payer: Self-pay | Admitting: Orthopedic Surgery

## 2018-03-25 ENCOUNTER — Ambulatory Visit (INDEPENDENT_AMBULATORY_CARE_PROVIDER_SITE_OTHER): Payer: PPO | Admitting: Orthopedic Surgery

## 2018-03-25 VITALS — Ht 73.0 in | Wt 170.0 lb

## 2018-03-25 DIAGNOSIS — Z981 Arthrodesis status: Secondary | ICD-10-CM

## 2018-03-25 DIAGNOSIS — M19071 Primary osteoarthritis, right ankle and foot: Secondary | ICD-10-CM

## 2018-03-25 MED ORDER — LIDOCAINE HCL 1 % IJ SOLN
1.0000 mL | INTRAMUSCULAR | Status: AC | PRN
  Administered 2018-03-25: 1 mL

## 2018-03-25 MED ORDER — METHYLPREDNISOLONE ACETATE 80 MG/ML IJ SUSP
80.0000 mg | INTRAMUSCULAR | Status: AC | PRN
  Administered 2018-03-25: 80 mg via INTRA_ARTICULAR

## 2018-03-25 NOTE — Progress Notes (Signed)
Office Visit Note   Patient: Bradley Espinoza           Date of Birth: September 25, 1956           MRN: [id number] Visit Date: 03/25/2018              Requested by: Celene Squibb, MD [place], [place] [id number] PCP: Celene Squibb, MD  Chief Complaint  Patient presents with  . Right Ankle - Follow-up    11/27/17 right tibiocalcaneal fusion       HPI: Patient is a 62 year old gentleman status post tibial calcaneal fusion for a complex open calcaneal fracture.  Patient complains of pain directly over the talonavicular joint.  Patient states the pain is worse at the end of the day.  Is currently wearing fracture boot and a 9/16 inch heel lift.  Assessment & Plan: Visit Diagnoses:  1. S/P ankle fusion   2. Arthritis of ankle or foot, degenerative, right     Plan: The talonavicular joint was injected we will see how this does to relieve his pain.  Discussed that if we proceed with fusing additional joints this may cause arthritis and unaffected joints at this time and may cause more foot pain.  Follow-Up Instructions: Return in about 1 month (around 04/22/2018).   Ortho Exam  Patient is alert, oriented, no adenopathy, well-dressed, normal affect, normal respiratory effort. Examination patient's foot is plantigrade and his ankle was at 90 degrees.  There is no redness no cellulitis no swelling no signs of infection.  The talonavicular joint is tender to palpation.  Imaging: Xr Ankle 2 Views Right  Result Date: 03/25/2018 2 view radiographs of the right ankle shows delayed tibial talar fusion.  The hardware is intact.  Xr Foot Complete Right  Result Date: 03/25/2018 2 view radiographs of the right foot shows bony spurs at the talonavicular joint.  No images are attached to the encounter.  Labs: Lab Results  Component Value Date   HGBA1C 5.1 06/26/2016   REPTSTATUS 04/10/2017 FINAL 04/05/2017   GRAMSTAIN  04/05/2017    FEW WBC PRESENT, PREDOMINANTLY PMN NO  ORGANISMS SEEN    CULT No growth aerobically or anaerobically. 04/05/2017     Lab Results  Component Value Date   ALBUMIN 4.3 08/12/2015   ALBUMIN 4.1 05/06/2015   ALBUMIN 4.0 09/15/2014    Body mass index is 22.43 kg/m.  Orders:  Orders Placed This Encounter  Procedures  . XR Ankle 2 Views Right  . XR Foot Complete Right   No orders of the defined types were placed in this encounter.    Procedures: Small Joint Inj: R intertarsal on 03/25/2018 2:13 PM Indications: pain and diagnostic evaluation Details: 22 G needle, dorsal approach  Spinal Needle: No  Medications: 1 mL lidocaine 1 %; 80 mg methylPREDNISolone acetate 80 MG/ML Outcome: tolerated well, no immediate complications Procedure, treatment alternatives, risks and benefits explained, specific risks discussed. Consent was given by the patient. Immediately prior to procedure a time out was called to verify the correct patient, procedure, equipment, support staff and site/side marked as required. Patient was prepped and draped in the usual sterile fashion.      Clinical Data: No additional findings.  ROS:  All other systems negative, except as noted in the HPI. Review of Systems  Objective: Vital Signs: Ht 6\' 1"  (1.854 m)   Wt 170 lb (77.1 kg)   BMI 22.43 kg/m   Specialty Comments:  No specialty  comments available.  PMFS History: Patient Active Problem List   Diagnosis Date Noted  . S/P ankle fusion 11/27/2017  . Nonunion of subtalar arthrodesis   . Avascular necrosis of talus, right ([place])   . Post-traumatic osteoarthritis, left ankle and foot 10/22/2017  . DDD (degenerative disc disease), lumbar 04/05/2017  . Fracture of L2 vertebra ([place]) 01/12/2015  . Acute osteomyelitis of calcaneum ([facility]) 10/09/2014  . Dysuria 10/05/2014  . Cellulitis 10/05/2014  . Fall from ladder 09/21/2014  . L2 vertebral fracture ([facility]) 09/21/2014  . Bilateral calcaneal fractures 09/21/2014  . Chronic pain 09/21/2014  .  Acute blood loss anemia 09/21/2014  . Open fracture of both calcanei 09/15/2014   Past Medical History:  Diagnosis Date  . Anxiety   . Arthritis    low back pain, lumbar radiculopathy  . Depression   . Fracture    B/L ankles  . GERD (gastroesophageal reflux disease)   . Headache(784.0)    allergy related   . History of kidney stones   . History of stomach ulcers   . Retained orthopedic hardware    failed retained hardware right foot  . Wears glasses     Family History  Problem Relation Age of Onset  . Diabetes Father   . Cancer Other     Past Surgical History:  Procedure Laterality Date  . ANKLE FUSION Right 05/11/2015   Procedure: Right Posterior Arthroscopic Subtalar Arthrodesis;  Surgeon: Newt Minion, MD;  Location: [facility];  Service: Orthopedics;  Laterality: Right;  . ANKLE FUSION Right 11/27/2017   Procedure: RIGHT TIBIOCALCANEAL FUSION;  Surgeon: Newt Minion, MD;  Location: [facility];  Service: Orthopedics;  Laterality: Right;  . ANTERIOR LAT LUMBAR FUSION N/A 04/05/2017   Procedure: Extreme Lateral Interbody Fusion - Lumbar three-lumbar four ,exploration of fusion Posterior augmentation with globus addition Removal hardware Lumbar one-three. Lumbar four-sacral one,  Removal internal bone growth stimulator;  Surgeon: Kary Kos, MD;  Location: [facility];  Service: Neurosurgery;  Laterality: N/A;  . BACK SURGERY  2004   x 2  . CERVICAL SPINE SURGERY  2008  . ESOPHAGOGASTRODUODENOSCOPY    . HARDWARE REMOVAL Right 10/09/2014   Procedure: Removal Deep Hardware, Irrigation and Debridement Calcaneus, Place Antibiotic Beads and Wound VAC ;  Surgeon: Newt Minion, MD;  Location: [facility];  Service: Orthopedics;  Laterality: Right;  . HARDWARE REMOVAL Right 08/12/2015   Procedure: Removal Deep Hardware Right Foot;  Surgeon: Newt Minion, MD;  Location: [facility];  Service: Orthopedics;  Laterality: Right;  . I&D EXTREMITY Right 09/15/2014   Procedure: IRRIGATION AND DEBRIDEMENT Ankle;  Surgeon:  Renette Butters, MD;  Location: [facility];  Service: Orthopedics;  Laterality: Right;  . INGUINAL HERNIA REPAIR Bilateral   . LUMBAR PERCUTANEOUS PEDICLE SCREW 1 LEVEL N/A 04/05/2017   Procedure: LUMBAR PERCUTANEOUS PEDICLE SCREW LUMBAR THREE-FOUR;  Surgeon: Kary Kos, MD;  Location: [facility];  Service: Neurosurgery;  Laterality: N/A;  . ORIF CALCANEOUS FRACTURE Right 09/19/2014   Procedure: OPEN REDUCTION INTERNAL FIXATION (ORIF) CALCANEOUS FRACTURE;  Surgeon: Newt Minion, MD;  Location: [facility];  Service: Orthopedics;  Laterality: Right;  . ORIF CALCANEOUS FRACTURE Left 09/19/2014   Procedure: OPEN REDUCTION INTERNAL FIXATION (ORIF) CALCANEOUS FRACTURE;  Surgeon: Newt Minion, MD;  Location: [facility];  Service: Orthopedics;  Laterality: Left;   Social History   Occupational History    Comment: disabled  Tobacco Use  . Smoking status: Current Some Day Smoker    Packs/day:  1.00    Years: 10.00    Pack years: 10.00    Types: Cigars    Start date: 10/08/1976    Last attempt to quit: 09/07/2014    Years since quitting: 3.5  . Smokeless tobacco: Never Used  . Tobacco comment: 06/26/16 reports smokes a cigar 2 times a week at the most  Substance and Sexual Activity  . Alcohol use: No    Comment: 06/26/16 quit 1998  . Drug use: No  . Sexual activity: Yes    Birth control/protection: None

## 2018-04-28 ENCOUNTER — Ambulatory Visit (INDEPENDENT_AMBULATORY_CARE_PROVIDER_SITE_OTHER): Payer: PPO | Admitting: Orthopedic Surgery

## 2018-04-29 ENCOUNTER — Encounter (INDEPENDENT_AMBULATORY_CARE_PROVIDER_SITE_OTHER): Payer: Self-pay | Admitting: Orthopedic Surgery

## 2018-04-29 ENCOUNTER — Other Ambulatory Visit: Payer: Self-pay | Admitting: Neurosurgery

## 2018-04-29 ENCOUNTER — Ambulatory Visit (INDEPENDENT_AMBULATORY_CARE_PROVIDER_SITE_OTHER): Payer: PPO | Admitting: Orthopedic Surgery

## 2018-04-29 VITALS — Ht 73.0 in | Wt 170.0 lb

## 2018-04-29 DIAGNOSIS — M25571 Pain in right ankle and joints of right foot: Secondary | ICD-10-CM | POA: Diagnosis not present

## 2018-04-29 DIAGNOSIS — M5440 Lumbago with sciatica, unspecified side: Secondary | ICD-10-CM

## 2018-04-29 DIAGNOSIS — Z981 Arthrodesis status: Secondary | ICD-10-CM

## 2018-04-29 NOTE — Progress Notes (Signed)
Office Visit Note   Patient: Bradley Espinoza           Date of Birth: 1955/10/26           MRN: [id number] Visit Date: 04/29/2018              Requested by: Celene Squibb, MD [place], [place] [address] PCP: Celene Squibb, MD  Chief Complaint  Patient presents with  . Right Foot - Follow-up      HPI: Patient is a 62 year old gentleman status post tibial calcaneal fusion of the right.  He previously underwent a talonavicular injection which she states that did help for about a week patient states he has burning and stinging with range of motion of the midfoot.  Patient denies any pain in the hindfoot.  Patient states that the did have a CT scan of his lumbar spine he will follow-up with Dr. Vertell Limber for lumbar spine fusion surgery.  Assessment & Plan: Visit Diagnoses:  1. Pain in right ankle and joints of right foot   2. S/P ankle fusion     Plan: Patient is given a prescription for biotech for an anterior AFO with a carbon fiber plate on the bottom.  This we can use it in different shoes he is getting about a three-quarter inch lift for his right shoe to assist with his gait.  Follow-Up Instructions: Return in about 2 months (around 06/30/2018).   Ortho Exam  Patient is alert, oriented, no adenopathy, well-dressed, normal affect, normal respiratory effort. Examination patient's foot is plantigrade there is no swelling no cellulitis he has pain with range of motion through the midfoot.  Imaging: No results found. No images are attached to the encounter.  Labs: Lab Results  Component Value Date   HGBA1C 5.1 06/26/2016   REPTSTATUS 04/10/2017 FINAL 04/05/2017   GRAMSTAIN  04/05/2017    FEW WBC PRESENT, PREDOMINANTLY PMN NO ORGANISMS SEEN    CULT No growth aerobically or anaerobically. 04/05/2017     Lab Results  Component Value Date   ALBUMIN 4.3 08/12/2015   ALBUMIN 4.1 05/06/2015   ALBUMIN 4.0 09/15/2014    Body mass index is 22.43  kg/m.  Orders:  No orders of the defined types were placed in this encounter.  No orders of the defined types were placed in this encounter.    Procedures: No procedures performed  Clinical Data: No additional findings.  ROS:  All other systems negative, except as noted in the HPI. Review of Systems  Objective: Vital Signs: Ht 6\' 1"  (1.854 m)   Wt 170 lb (77.1 kg)   BMI 22.43 kg/m   Specialty Comments:  No specialty comments available.  PMFS History: Patient Active Problem List   Diagnosis Date Noted  . S/P ankle fusion 11/27/2017  . Nonunion of subtalar arthrodesis   . Avascular necrosis of talus, right ([facility])   . Post-traumatic osteoarthritis, left ankle and foot 10/22/2017  . DDD (degenerative disc disease), lumbar 04/05/2017  . Fracture of L2 vertebra ([facility]) 01/12/2015  . Acute osteomyelitis of calcaneum ([place]) 10/09/2014  . Dysuria 10/05/2014  . Cellulitis 10/05/2014  . Fall from ladder 09/21/2014  . L2 vertebral fracture ([facility]) 09/21/2014  . Bilateral calcaneal fractures 09/21/2014  . Chronic pain 09/21/2014  . Acute blood loss anemia 09/21/2014  . Open fracture of both calcanei 09/15/2014   Past Medical History:  Diagnosis Date  . Anxiety   . Arthritis    low back pain,  lumbar radiculopathy  . Depression   . Fracture    B/L ankles  . GERD (gastroesophageal reflux disease)   . Headache(784.0)    allergy related   . History of kidney stones   . History of stomach ulcers   . Retained orthopedic hardware    failed retained hardware right foot  . Wears glasses     Family History  Problem Relation Age of Onset  . Diabetes Father   . Cancer Other     Past Surgical History:  Procedure Laterality Date  . ANKLE FUSION Right 05/11/2015   Procedure: Right Posterior Arthroscopic Subtalar Arthrodesis;  Surgeon: Newt Minion, MD;  Location: [facility];  Service: Orthopedics;  Laterality: Right;  . ANKLE FUSION Right 11/27/2017   Procedure: RIGHT TIBIOCALCANEAL  FUSION;  Surgeon: Newt Minion, MD;  Location: [facility];  Service: Orthopedics;  Laterality: Right;  . ANTERIOR LAT LUMBAR FUSION N/A 04/05/2017   Procedure: Extreme Lateral Interbody Fusion - Lumbar three-lumbar four ,exploration of fusion Posterior augmentation with globus addition Removal hardware Lumbar one-three. Lumbar four-sacral one,  Removal internal bone growth stimulator;  Surgeon: Kary Kos, MD;  Location: [facility];  Service: Neurosurgery;  Laterality: N/A;  . BACK SURGERY  2004   x 2  . CERVICAL SPINE SURGERY  2008  . ESOPHAGOGASTRODUODENOSCOPY    . HARDWARE REMOVAL Right 10/09/2014   Procedure: Removal Deep Hardware, Irrigation and Debridement Calcaneus, Place Antibiotic Beads and Wound VAC ;  Surgeon: Newt Minion, MD;  Location: [facility];  Service: Orthopedics;  Laterality: Right;  . HARDWARE REMOVAL Right 08/12/2015   Procedure: Removal Deep Hardware Right Foot;  Surgeon: Newt Minion, MD;  Location: [facility];  Service: Orthopedics;  Laterality: Right;  . I&D EXTREMITY Right 09/15/2014   Procedure: IRRIGATION AND DEBRIDEMENT Ankle;  Surgeon: Renette Butters, MD;  Location: [facility];  Service: Orthopedics;  Laterality: Right;  . INGUINAL HERNIA REPAIR Bilateral   . LUMBAR PERCUTANEOUS PEDICLE SCREW 1 LEVEL N/A 04/05/2017   Procedure: LUMBAR PERCUTANEOUS PEDICLE SCREW LUMBAR THREE-FOUR;  Surgeon: Kary Kos, MD;  Location: [facility];  Service: Neurosurgery;  Laterality: N/A;  . ORIF CALCANEOUS FRACTURE Right 09/19/2014   Procedure: OPEN REDUCTION INTERNAL FIXATION (ORIF) CALCANEOUS FRACTURE;  Surgeon: Newt Minion, MD;  Location: [facility];  Service: Orthopedics;  Laterality: Right;  . ORIF CALCANEOUS FRACTURE Left 09/19/2014   Procedure: OPEN REDUCTION INTERNAL FIXATION (ORIF) CALCANEOUS FRACTURE;  Surgeon: Newt Minion, MD;  Location: [facility];  Service: Orthopedics;  Laterality: Left;   Social History   Occupational History    Comment: disabled  Tobacco Use  . Smoking status: Current Some Day  Smoker    Packs/day: 1.00    Years: 10.00    Pack years: 10.00    Types: Cigars    Start date: 10/08/1976    Last attempt to quit: 09/07/2014    Years since quitting: 3.6  . Smokeless tobacco: Never Used  . Tobacco comment: 06/26/16 reports smokes a cigar 2 times a week at the most  Substance and Sexual Activity  . Alcohol use: No    Comment: 06/26/16 quit 1998  . Drug use: No  . Sexual activity: Yes    Birth control/protection: None

## 2018-05-09 ENCOUNTER — Ambulatory Visit
Admission: RE | Admit: 2018-05-09 | Discharge: 2018-05-09 | Disposition: A | Discharge status: Home / Self Care | Payer: PPO | Source: Ambulatory Visit | Attending: Neurosurgery | Admitting: Neurosurgery

## 2018-05-09 DIAGNOSIS — M5440 Lumbago with sciatica, unspecified side: Secondary | ICD-10-CM

## 2018-05-09 DIAGNOSIS — M545 Low back pain: Secondary | ICD-10-CM | POA: Diagnosis not present

## 2018-05-13 DIAGNOSIS — M5440 Lumbago with sciatica, unspecified side: Secondary | ICD-10-CM | POA: Diagnosis not present

## 2018-05-14 DIAGNOSIS — R030 Elevated blood-pressure reading, without diagnosis of hypertension: Secondary | ICD-10-CM | POA: Diagnosis not present

## 2018-05-14 DIAGNOSIS — M5440 Lumbago with sciatica, unspecified side: Secondary | ICD-10-CM | POA: Diagnosis not present

## 2018-05-14 DIAGNOSIS — M5441 Lumbago with sciatica, right side: Secondary | ICD-10-CM | POA: Diagnosis not present

## 2018-05-20 IMAGING — CR DG LUMBAR SPINE 1V
1 series · 1 of 1 positions shown · non-contrast
Comparison: CT myelogram 05/02/2016

CLINICAL DATA: Back surgery, removal of hardware, removal of bone
growth stimulator

EXAM:
LUMBAR SPINE - 1 VIEW

[lateral]
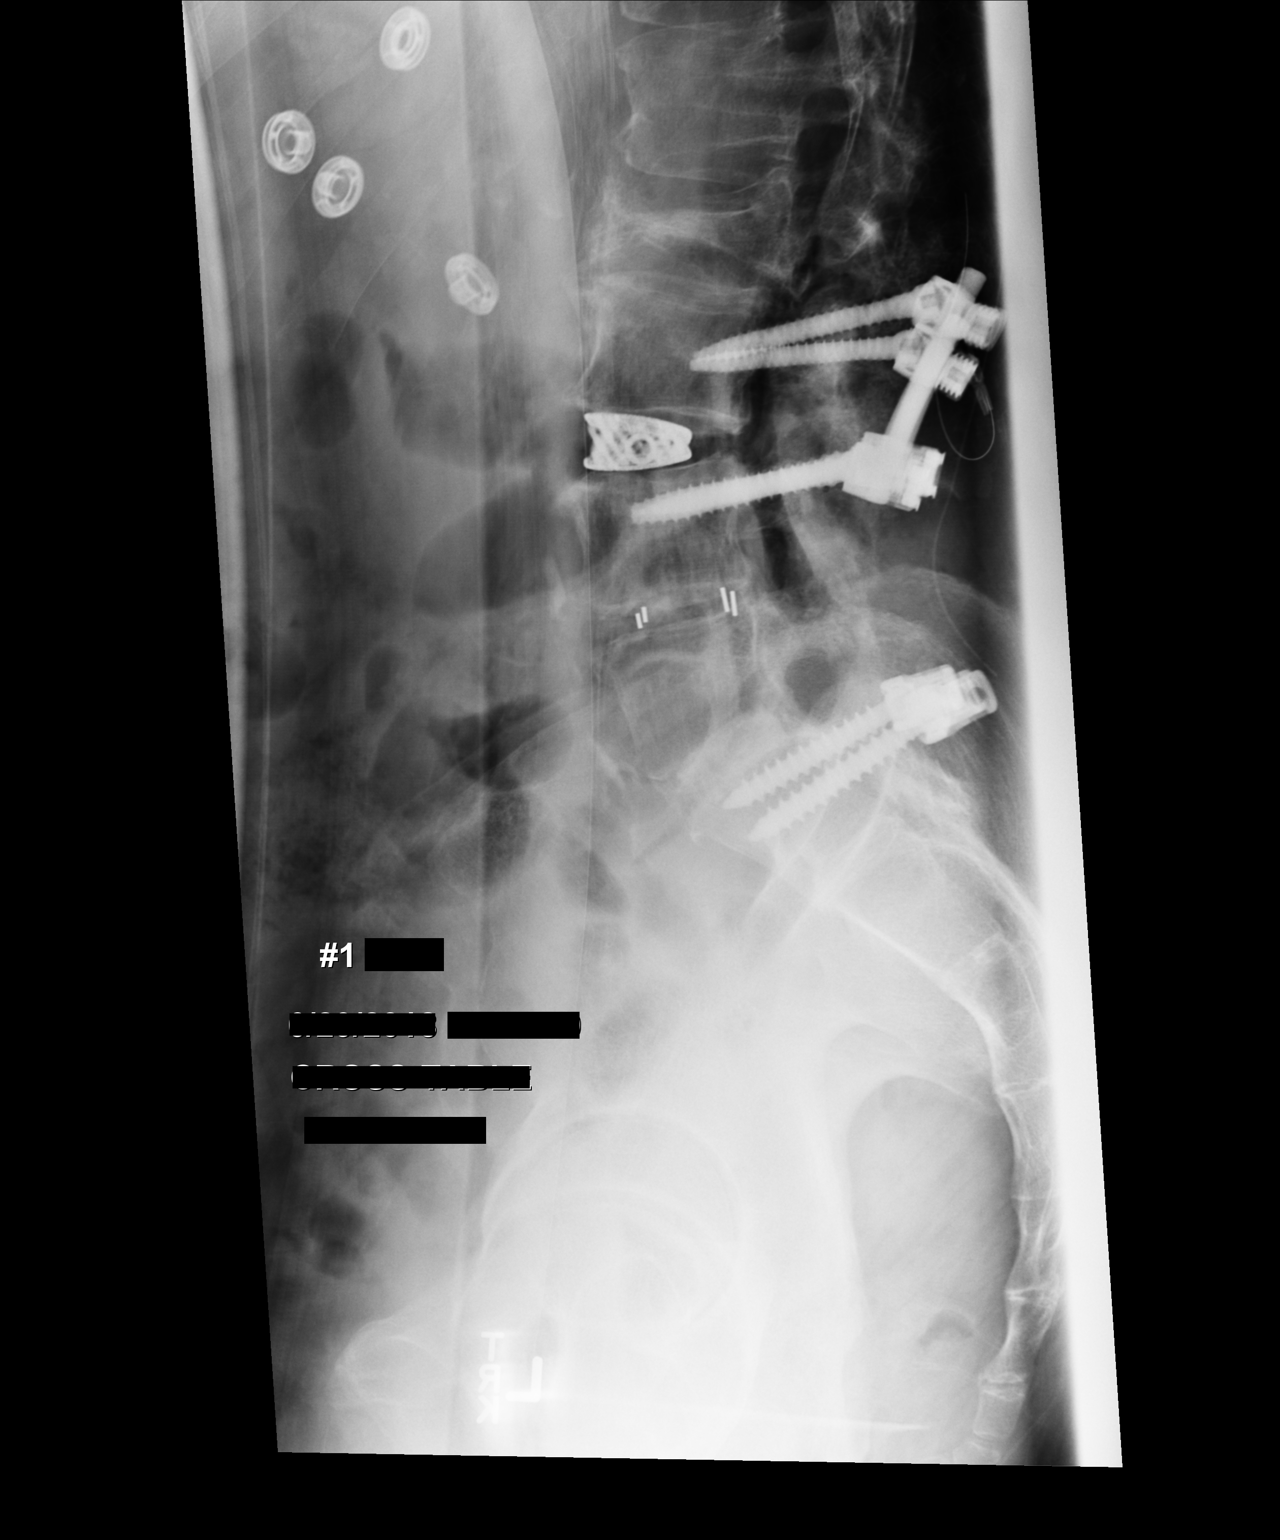

[1 of 1 positions shown; findings below may reference images not displayed]

FINDINGS: Single lateral radiographic image in the operative room was obtained
of the lumbar spine

Chronic burst fracture of L2. Interval removal of pedicle screws at
L1 with connecting posterior rods posteriorly.

Bilateral pedicle screw and posterior rod fusion at L3-4 unchanged.
Interbody cage fusion at L3-4.

Interval removal of bilateral screws at L5. S1 screws remain in
place but are not connected posteriorly. Interbody spacer at L4-5 in
good position.
IMPRESSION: Interval removal of screws at L1 and L5. Bone stimulator wire
remains apparent on this image.

Pedicle screw and interbody fusion L3-4

## 2018-06-17 DIAGNOSIS — M21371 Foot drop, right foot: Secondary | ICD-10-CM | POA: Diagnosis not present

## 2018-06-19 DIAGNOSIS — M47816 Spondylosis without myelopathy or radiculopathy, lumbar region: Secondary | ICD-10-CM | POA: Diagnosis not present

## 2018-06-19 DIAGNOSIS — M545 Low back pain: Secondary | ICD-10-CM | POA: Diagnosis not present

## 2018-06-30 ENCOUNTER — Ambulatory Visit (INDEPENDENT_AMBULATORY_CARE_PROVIDER_SITE_OTHER): Payer: PPO | Admitting: Orthopedic Surgery

## 2018-06-30 ENCOUNTER — Encounter (INDEPENDENT_AMBULATORY_CARE_PROVIDER_SITE_OTHER): Payer: Self-pay | Admitting: Orthopedic Surgery

## 2018-06-30 VITALS — Ht 72.0 in | Wt 165.0 lb

## 2018-06-30 DIAGNOSIS — M19172 Post-traumatic osteoarthritis, left ankle and foot: Secondary | ICD-10-CM

## 2018-06-30 DIAGNOSIS — Z981 Arthrodesis status: Secondary | ICD-10-CM

## 2018-06-30 DIAGNOSIS — M25571 Pain in right ankle and joints of right foot: Secondary | ICD-10-CM | POA: Diagnosis not present

## 2018-06-30 MED ORDER — METHYLPREDNISOLONE ACETATE 80 MG/ML IJ SUSP
80.0000 mg | INTRAMUSCULAR | Status: AC | PRN
  Administered 2018-06-30: 80 mg via INTRA_ARTICULAR

## 2018-06-30 MED ORDER — LIDOCAINE HCL 1 % IJ SOLN
1.0000 mL | INTRAMUSCULAR | Status: AC | PRN
Start: 2018-06-30 — End: 2018-06-30
  Administered 2018-06-30: 1 mL

## 2018-06-30 NOTE — Progress Notes (Signed)
Office Visit Note   Patient: Bradley Espinoza           Date of Birth: 1956/01/12           MRN: [id number] Visit Date: 06/30/2018              Requested by: Celene Squibb, MD [place], [place] [address] PCP: Celene Squibb, MD  Chief Complaint  Patient presents with  . Right Foot - Follow-up    11/27/17 Right Tibiocalcaneal Fusion  . Left Foot - Pain      HPI: Patient is a 62 year old gentleman who is status post bilateral calcaneal fractures.  Patient developed infection from the open calcaneal fracture of the right proceeded with a tibial calcaneal fusion.  Patient states he primarily has pain along the tibia at this time.  Patient also complains of pain in the subtalar joint on the left.  Patient complains of chronic back pain he is status post fusion and he states that he may require additional fusion surgery.  Assessment & Plan: Visit Diagnoses:  1. Pain in right ankle and joints of right foot   2. S/P ankle fusion   3. Post-traumatic osteoarthritis, left ankle and foot     Plan: Patient underwent injection of the subtalar joint of the left.  Discussed that if we get good relief with the injection we could proceed with a subtalar fusion on the left.  Will continue with the brace on the right and would consider surgery on the left after patient is made a determination of whether he needs lumbar spine surgery revision.  Follow-Up Instructions: Return in about 4 weeks (around 07/28/2018).   Ortho Exam  Patient is alert, oriented, no adenopathy, well-dressed, normal affect, normal respiratory effort. Patient's feet are plantigrade bilaterally.  Patient's pain in the right foot is primarily along the distal third of the tibia.  There is no pain on the plantar aspect of his foot no pain to the forefoot.  Examination of the left foot patient has tenderness to palpation over the sinus Tarsi.  He has no pain with range of motion of the ankle there is no swelling in  either lower extremity.  Imaging: No results found. No images are attached to the encounter.  Labs: Lab Results  Component Value Date   HGBA1C 5.1 06/26/2016   REPTSTATUS 04/10/2017 FINAL 04/05/2017   GRAMSTAIN  04/05/2017    FEW WBC PRESENT, PREDOMINANTLY PMN NO ORGANISMS SEEN    CULT No growth aerobically or anaerobically. 04/05/2017     Lab Results  Component Value Date   ALBUMIN 4.3 08/12/2015   ALBUMIN 4.1 05/06/2015   ALBUMIN 4.0 09/15/2014    Body mass index is 22.38 kg/m.  Orders:  No orders of the defined types were placed in this encounter.  No orders of the defined types were placed in this encounter.    Procedures: Small Joint Inj: L subtalar on 06/30/2018 10:47 AM Indications: pain and diagnostic evaluation Details: 22 G needle, dorsal approach  Spinal Needle: No  Medications: 1 mL lidocaine 1 %; 80 mg methylPREDNISolone acetate 80 MG/ML Outcome: tolerated well, no immediate complications Procedure, treatment alternatives, risks and benefits explained, specific risks discussed. Consent was given by the patient. Immediately prior to procedure a time out was called to verify the correct patient, procedure, equipment, support staff and site/side marked as required. Patient was prepped and draped in the usual sterile fashion.      Clinical Data:  No additional findings.  ROS:  All other systems negative, except as noted in the HPI. Review of Systems  Objective: Vital Signs: Ht 6' (1.829 m)   Wt 165 lb (74.8 kg)   BMI 22.38 kg/m   Specialty Comments:  No specialty comments available.  PMFS History: Patient Active Problem List   Diagnosis Date Noted  . S/P ankle fusion 11/27/2017  . Nonunion of subtalar arthrodesis   . Avascular necrosis of talus, right ([facility])   . Post-traumatic osteoarthritis, left ankle and foot 10/22/2017  . DDD (degenerative disc disease), lumbar 04/05/2017  . Fracture of L2 vertebra ([facility]) 01/12/2015  . Acute  osteomyelitis of calcaneum ([facility]) 10/09/2014  . Dysuria 10/05/2014  . Cellulitis 10/05/2014  . Fall from ladder 09/21/2014  . L2 vertebral fracture ([facility]) 09/21/2014  . Bilateral calcaneal fractures 09/21/2014  . Chronic pain 09/21/2014  . Acute blood loss anemia 09/21/2014  . Open fracture of both calcanei 09/15/2014   Past Medical History:  Diagnosis Date  . Anxiety   . Arthritis    low back pain, lumbar radiculopathy  . Depression   . Fracture    B/L ankles  . GERD (gastroesophageal reflux disease)   . Headache(784.0)    allergy related   . History of kidney stones   . History of stomach ulcers   . Retained orthopedic hardware    failed retained hardware right foot  . Wears glasses     Family History  Problem Relation Age of Onset  . Diabetes Father   . Cancer Other     Past Surgical History:  Procedure Laterality Date  . ANKLE FUSION Right 05/11/2015   Procedure: Right Posterior Arthroscopic Subtalar Arthrodesis;  Surgeon: Newt Minion, MD;  Location: [facility];  Service: Orthopedics;  Laterality: Right;  . ANKLE FUSION Right 11/27/2017   Procedure: RIGHT TIBIOCALCANEAL FUSION;  Surgeon: Newt Minion, MD;  Location: [facility];  Service: Orthopedics;  Laterality: Right;  . ANTERIOR LAT LUMBAR FUSION N/A 04/05/2017   Procedure: Extreme Lateral Interbody Fusion - Lumbar three-lumbar four ,exploration of fusion Posterior augmentation with globus addition Removal hardware Lumbar one-three. Lumbar four-sacral one,  Removal internal bone growth stimulator;  Surgeon: Kary Kos, MD;  Location: [facility];  Service: Neurosurgery;  Laterality: N/A;  . BACK SURGERY  2004   x 2  . CERVICAL SPINE SURGERY  2008  . ESOPHAGOGASTRODUODENOSCOPY    . HARDWARE REMOVAL Right 10/09/2014   Procedure: Removal Deep Hardware, Irrigation and Debridement Calcaneus, Place Antibiotic Beads and Wound VAC ;  Surgeon: Newt Minion, MD;  Location: [facility];  Service: Orthopedics;  Laterality: Right;  . HARDWARE REMOVAL  Right 08/12/2015   Procedure: Removal Deep Hardware Right Foot;  Surgeon: Newt Minion, MD;  Location: [facility];  Service: Orthopedics;  Laterality: Right;  . I&D EXTREMITY Right 09/15/2014   Procedure: IRRIGATION AND DEBRIDEMENT Ankle;  Surgeon: Renette Butters, MD;  Location: [facility];  Service: Orthopedics;  Laterality: Right;  . INGUINAL HERNIA REPAIR Bilateral   . LUMBAR PERCUTANEOUS PEDICLE SCREW 1 LEVEL N/A 04/05/2017   Procedure: LUMBAR PERCUTANEOUS PEDICLE SCREW LUMBAR THREE-FOUR;  Surgeon: Kary Kos, MD;  Location: [facility];  Service: Neurosurgery;  Laterality: N/A;  . ORIF CALCANEOUS FRACTURE Right 09/19/2014   Procedure: OPEN REDUCTION INTERNAL FIXATION (ORIF) CALCANEOUS FRACTURE;  Surgeon: Newt Minion, MD;  Location: [facility];  Service: Orthopedics;  Laterality: Right;  . ORIF CALCANEOUS FRACTURE Left 09/19/2014   Procedure: OPEN REDUCTION INTERNAL FIXATION (ORIF)  CALCANEOUS FRACTURE;  Surgeon: Newt Minion, MD;  Location: [facility];  Service: Orthopedics;  Laterality: Left;   Social History   Occupational History    Comment: disabled  Tobacco Use  . Smoking status: Current Some Day Smoker    Packs/day: 1.00    Years: 10.00    Pack years: 10.00    Types: Cigars    Start date: 10/08/1976    Last attempt to quit: 09/07/2014    Years since quitting: 3.8  . Smokeless tobacco: Never Used  . Tobacco comment: 06/26/16 reports smokes a cigar 2 times a week at the most  Substance and Sexual Activity  . Alcohol use: No    Comment: 06/26/16 quit 1998  . Drug use: No  . Sexual activity: Yes    Birth control/protection: None

## 2018-07-03 DIAGNOSIS — M47816 Spondylosis without myelopathy or radiculopathy, lumbar region: Secondary | ICD-10-CM | POA: Diagnosis not present

## 2018-07-10 DIAGNOSIS — M5440 Lumbago with sciatica, unspecified side: Secondary | ICD-10-CM | POA: Diagnosis not present

## 2018-07-24 DIAGNOSIS — N400 Enlarged prostate without lower urinary tract symptoms: Secondary | ICD-10-CM | POA: Diagnosis not present

## 2018-07-24 DIAGNOSIS — J039 Acute tonsillitis, unspecified: Secondary | ICD-10-CM | POA: Diagnosis not present

## 2018-07-24 DIAGNOSIS — Z6823 Body mass index (BMI) 23.0-23.9, adult: Secondary | ICD-10-CM | POA: Diagnosis not present

## 2018-07-24 DIAGNOSIS — M47816 Spondylosis without myelopathy or radiculopathy, lumbar region: Secondary | ICD-10-CM | POA: Diagnosis not present

## 2018-07-24 DIAGNOSIS — J029 Acute pharyngitis, unspecified: Secondary | ICD-10-CM | POA: Diagnosis not present

## 2018-07-24 DIAGNOSIS — R5383 Other fatigue: Secondary | ICD-10-CM | POA: Diagnosis not present

## 2018-07-24 DIAGNOSIS — E291 Testicular hypofunction: Secondary | ICD-10-CM | POA: Diagnosis not present

## 2018-07-28 ENCOUNTER — Ambulatory Visit (INDEPENDENT_AMBULATORY_CARE_PROVIDER_SITE_OTHER): Payer: PPO | Admitting: Orthopedic Surgery

## 2018-08-07 ENCOUNTER — Other Ambulatory Visit: Payer: Self-pay | Admitting: Neurosurgery

## 2018-08-07 DIAGNOSIS — I10 Essential (primary) hypertension: Secondary | ICD-10-CM | POA: Diagnosis not present

## 2018-08-07 DIAGNOSIS — S32001A Stable burst fracture of unspecified lumbar vertebra, initial encounter for closed fracture: Secondary | ICD-10-CM | POA: Diagnosis not present

## 2018-08-07 DIAGNOSIS — M47816 Spondylosis without myelopathy or radiculopathy, lumbar region: Secondary | ICD-10-CM | POA: Diagnosis not present

## 2018-08-07 DIAGNOSIS — M545 Low back pain: Secondary | ICD-10-CM | POA: Diagnosis not present

## 2018-08-11 ENCOUNTER — Ambulatory Visit (INDEPENDENT_AMBULATORY_CARE_PROVIDER_SITE_OTHER): Payer: PPO | Admitting: Orthopedic Surgery

## 2018-08-11 ENCOUNTER — Encounter (INDEPENDENT_AMBULATORY_CARE_PROVIDER_SITE_OTHER): Payer: Self-pay | Admitting: Orthopedic Surgery

## 2018-08-11 VITALS — Ht 72.0 in | Wt 165.0 lb

## 2018-08-11 DIAGNOSIS — Z981 Arthrodesis status: Secondary | ICD-10-CM | POA: Diagnosis not present

## 2018-08-11 DIAGNOSIS — M25571 Pain in right ankle and joints of right foot: Secondary | ICD-10-CM | POA: Diagnosis not present

## 2018-08-11 NOTE — Progress Notes (Signed)
Office Visit Note   Patient: Bradley Espinoza           Date of Birth: [birth year]           MRN: [id number] Visit Date: 08/11/2018              Requested by: Celene Squibb, MD [place], [place] [id number] PCP: Celene Squibb, MD  Chief Complaint  Patient presents with  . Right Ankle - Follow-up, Pain  . Left Ankle - Follow-up, Pain      HPI: Patient is a 62 year old gentleman who presents for 2 separate issues #1 subtalar pain in the left foot patient states he has had complete relief of the subtalar pain with the subtalar injection.  Patient is currently wearing an anterior AFO on the right he is status post tibial calcaneal fusion for open calcaneus fracture.  Patient states he still has pain with weightbearing pain with activities of daily living.  Patient states he is scheduled for back surgery with Dr. Saintclair Halsted on December 4.  Patient states that he still does smoke and now is smoking a cigar occasionally.  Assessment & Plan: Visit Diagnoses:  1. Pain in right ankle and joints of right foot   2. S/P ankle fusion     Plan: Discussed that with the persistent pain patient most likely has a fibrous union of the ankle fusion.  Discussed that once he has healed from the back surgery would recommend revision fusion of the ankle joint with removal of the intramedullary fixation and use of a lateral plate.  Risks and benefits were discussed including infection persistent fibrous union.  Discussed the importance of complete smoking cessation for bony healing.  Follow-Up Instructions: Return in about 2 months (around 10/11/2018).   Ortho Exam  Patient is alert, oriented, no adenopathy, well-dressed, normal affect, normal respiratory effort. Examination patient does have an antalgic gait he uses a cane patient does have good range of motion left ankle and subtalar joint this is asymptomatic at this time.  Right ankle is tender to palpation over the ankle joint.  Patient's  foot is plantigrade there is no redness no cellulitis no signs of infection.  Radiographs show fibrous union of the tibial talar joint.  Imaging: No results found. No images are attached to the encounter.  Labs: Lab Results  Component Value Date   HGBA1C 5.1 06/26/2016   REPTSTATUS 04/10/2017 FINAL 04/05/2017   GRAMSTAIN  04/05/2017    FEW WBC PRESENT, PREDOMINANTLY PMN NO ORGANISMS SEEN    CULT No growth aerobically or anaerobically. 04/05/2017     Lab Results  Component Value Date   ALBUMIN 4.3 08/12/2015   ALBUMIN 4.1 05/06/2015   ALBUMIN 4.0 09/15/2014    Body mass index is 22.38 kg/m.  Orders:  No orders of the defined types were placed in this encounter.  No orders of the defined types were placed in this encounter.    Procedures: No procedures performed  Clinical Data: No additional findings.  ROS:  All other systems negative, except as noted in the HPI. Review of Systems  Objective: Vital Signs: Ht 6' (1.829 m)   Wt 165 lb (74.8 kg)   BMI 22.38 kg/m   Specialty Comments:  No specialty comments available.  PMFS History: Patient Active Problem List   Diagnosis Date Noted  . S/P ankle fusion 11/27/2017  . Nonunion of subtalar arthrodesis   . Avascular necrosis of talus, right ([facility])   .  Post-traumatic osteoarthritis, left ankle and foot 10/22/2017  . DDD (degenerative disc disease), lumbar 04/05/2017  . Fracture of L2 vertebra ([facility]) 01/12/2015  . Acute osteomyelitis of calcaneum ([facility]) 10/09/2014  . Dysuria 10/05/2014  . Cellulitis 10/05/2014  . Fall from ladder 09/21/2014  . L2 vertebral fracture ([facility]) 09/21/2014  . Bilateral calcaneal fractures 09/21/2014  . Chronic pain 09/21/2014  . Acute blood loss anemia 09/21/2014  . Open fracture of both calcanei 09/15/2014   Past Medical History:  Diagnosis Date  . Anxiety   . Arthritis    low back pain, lumbar radiculopathy  . Depression   . Fracture    B/L ankles  . GERD (gastroesophageal  reflux disease)   . Headache(784.0)    allergy related   . History of kidney stones   . History of stomach ulcers   . Retained orthopedic hardware    failed retained hardware right foot  . Wears glasses     Family History  Problem Relation Age of Onset  . Diabetes Father   . Cancer Other     Past Surgical History:  Procedure Laterality Date  . ANKLE FUSION Right 05/11/2015   Procedure: Right Posterior Arthroscopic Subtalar Arthrodesis;  Surgeon: Newt Minion, MD;  Location: [facility];  Service: Orthopedics;  Laterality: Right;  . ANKLE FUSION Right 11/27/2017   Procedure: RIGHT TIBIOCALCANEAL FUSION;  Surgeon: Newt Minion, MD;  Location: [facility];  Service: Orthopedics;  Laterality: Right;  . ANTERIOR LAT LUMBAR FUSION N/A 04/05/2017   Procedure: Extreme Lateral Interbody Fusion - Lumbar three-lumbar four ,exploration of fusion Posterior augmentation with globus addition Removal hardware Lumbar one-three. Lumbar four-sacral one,  Removal internal bone growth stimulator;  Surgeon: Kary Kos, MD;  Location: [facility];  Service: Neurosurgery;  Laterality: N/A;  . BACK SURGERY  2004   x 2  . CERVICAL SPINE SURGERY  2008  . ESOPHAGOGASTRODUODENOSCOPY    . HARDWARE REMOVAL Right 10/09/2014   Procedure: Removal Deep Hardware, Irrigation and Debridement Calcaneus, Place Antibiotic Beads and Wound VAC ;  Surgeon: Newt Minion, MD;  Location: [facility];  Service: Orthopedics;  Laterality: Right;  . HARDWARE REMOVAL Right 08/12/2015   Procedure: Removal Deep Hardware Right Foot;  Surgeon: Newt Minion, MD;  Location: [facility];  Service: Orthopedics;  Laterality: Right;  . I&D EXTREMITY Right 09/15/2014   Procedure: IRRIGATION AND DEBRIDEMENT Ankle;  Surgeon: Renette Butters, MD;  Location: [facility];  Service: Orthopedics;  Laterality: Right;  . INGUINAL HERNIA REPAIR Bilateral   . LUMBAR PERCUTANEOUS PEDICLE SCREW 1 LEVEL N/A 04/05/2017   Procedure: LUMBAR PERCUTANEOUS PEDICLE SCREW LUMBAR THREE-FOUR;  Surgeon:  Kary Kos, MD;  Location: [facility];  Service: Neurosurgery;  Laterality: N/A;  . ORIF CALCANEOUS FRACTURE Right 09/19/2014   Procedure: OPEN REDUCTION INTERNAL FIXATION (ORIF) CALCANEOUS FRACTURE;  Surgeon: Newt Minion, MD;  Location: [facility];  Service: Orthopedics;  Laterality: Right;  . ORIF CALCANEOUS FRACTURE Left 09/19/2014   Procedure: OPEN REDUCTION INTERNAL FIXATION (ORIF) CALCANEOUS FRACTURE;  Surgeon: Newt Minion, MD;  Location: [facility];  Service: Orthopedics;  Laterality: Left;   Social History   Occupational History    Comment: disabled  Tobacco Use  . Smoking status: Current Some Day Smoker    Packs/day: 1.00    Years: 10.00    Pack years: 10.00    Types: Cigars    Start date: 10/08/1976    Last attempt to quit: 09/07/2014    Years since quitting: 3.9  .  Smokeless tobacco: Never Used  . Tobacco comment: 06/26/16 reports smokes a cigar 2 times a week at the most  Substance and Sexual Activity  . Alcohol use: No    Comment: 06/26/16 quit 1998  . Drug use: No  . Sexual activity: Yes    Birth control/protection: None

## 2018-08-25 DIAGNOSIS — Z23 Encounter for immunization: Secondary | ICD-10-CM | POA: Diagnosis not present

## 2018-08-25 DIAGNOSIS — Z6823 Body mass index (BMI) 23.0-23.9, adult: Secondary | ICD-10-CM | POA: Diagnosis not present

## 2018-08-25 DIAGNOSIS — J039 Acute tonsillitis, unspecified: Secondary | ICD-10-CM | POA: Diagnosis not present

## 2018-08-25 DIAGNOSIS — E291 Testicular hypofunction: Secondary | ICD-10-CM | POA: Diagnosis not present

## 2018-08-25 DIAGNOSIS — J029 Acute pharyngitis, unspecified: Secondary | ICD-10-CM | POA: Diagnosis not present

## 2018-08-25 DIAGNOSIS — N400 Enlarged prostate without lower urinary tract symptoms: Secondary | ICD-10-CM | POA: Diagnosis not present

## 2018-08-25 DIAGNOSIS — R5383 Other fatigue: Secondary | ICD-10-CM | POA: Diagnosis not present

## 2018-08-27 DIAGNOSIS — M5412 Radiculopathy, cervical region: Secondary | ICD-10-CM | POA: Diagnosis not present

## 2018-08-28 DIAGNOSIS — Z0000 Encounter for general adult medical examination without abnormal findings: Secondary | ICD-10-CM | POA: Diagnosis not present

## 2018-08-28 DIAGNOSIS — R5383 Other fatigue: Secondary | ICD-10-CM | POA: Diagnosis not present

## 2018-08-28 DIAGNOSIS — E782 Mixed hyperlipidemia: Secondary | ICD-10-CM | POA: Diagnosis not present

## 2018-08-28 DIAGNOSIS — M5136 Other intervertebral disc degeneration, lumbar region: Secondary | ICD-10-CM | POA: Diagnosis not present

## 2018-08-29 NOTE — Pre-Procedure Instructions (Signed)
BRENNAN LITZINGER  08/29/2018      [place], [place] [address] [place] [address] Phone: [phone] Fax: [phone]    Your procedure is scheduled on September 10, 2018  Report to [facility] Admitting at 10:00 A.M.  Call this number if you have problems the morning of surgery:  [phone]   Remember:  Do not eat or drink after midnight.     Take these medicines the morning of surgery with A SIP OF WATER     Gabapentin  Tizanidine (Zanaflex)   7 days prior to surgery STOP taking any Aspirin(unless otherwise instructed by your surgeon), Aleve, Naproxen, Ibuprofen, Motrin, Advil, Goody's, BC's, all herbal medications, fish oil, and all vitamins    Do not wear jewelry.  Do not wear lotions, powders, colognes, or deodorant.  Do not shave 48 hours prior to surgery.  Men may shave face and neck.  Do not bring valuables to the hospital.  [facility] is not responsible for any belongings or valuables.   - Preparing For Surgery  Before surgery, you can play an important role. Because skin is not sterile, your skin needs to be as free of germs as possible. You can reduce the number of germs on your skin by washing with CHG (chlorahexidine gluconate) Soap before surgery.  CHG is an antiseptic cleaner which kills germs and bonds with the skin to continue killing germs even after washing.    Oral Hygiene is also important to reduce your risk of infection.  Remember - BRUSH YOUR TEETH THE MORNING OF SURGERY WITH YOUR REGULAR TOOTHPASTE  Please do not use if you have an allergy to CHG or antibacterial soaps. If your skin becomes reddened/irritated stop using the CHG.  Do not shave (including legs and underarms) for at least 48 hours prior to first CHG shower. It is OK to shave your face.  Please follow these instructions carefully.   1. Shower the NIGHT BEFORE SURGERY and the MORNING OF SURGERY with  CHG.   2. If you chose to wash your hair, wash your hair first as usual with your normal shampoo.  3. After you shampoo, rinse your hair and body thoroughly to remove the shampoo.  4. Use CHG as you would any other liquid soap. You can apply CHG directly to the skin and wash gently with a scrungie or a clean washcloth.   5. Apply the CHG Soap to your body ONLY FROM THE NECK DOWN.  Do not use on open wounds or open sores. Avoid contact with your eyes, ears, mouth and genitals (private parts). Wash Face and genitals (private parts)  with your normal soap.  6. Wash thoroughly, paying special attention to the area where your surgery will be performed.  7. Thoroughly rinse your body with warm water from the neck down.  8. DO NOT shower/wash with your normal soap after using and rinsing off the CHG Soap.  9. Pat yourself dry with a CLEAN TOWEL.  10. Wear CLEAN PAJAMAS to bed the night before surgery, wear comfortable clothes the morning of surgery  11. Place CLEAN SHEETS on your bed the night of your first shower and DO NOT SLEEP WITH PETS.   Day of Surgery:  Do not apply any deodorants/lotions.  Please wear clean clothes to the hospital/surgery center.   Remember to brush your teeth WITH YOUR REGULAR TOOTHPASTE.  Contacts, dentures or bridgework may not be worn into surgery.  Leave your suitcase in the car.  After surgery it may be brought to your room.  For patients admitted to the hospital, discharge time will be determined by your treatment team.  Patients discharged the day of surgery will not be allowed to drive home.   Please read over the following fact sheets that you were given. Coughing and Deep Breathing, MRSA Information and Surgical Site Infection Prevention

## 2018-09-01 ENCOUNTER — Other Ambulatory Visit: Payer: Self-pay

## 2018-09-01 ENCOUNTER — Encounter (HOSPITAL_COMMUNITY)
Admission: RE | Admit: 2018-09-01 | Discharge: 2018-09-01 | Disposition: A | Discharge status: Home / Self Care | Payer: PPO | Source: Ambulatory Visit | Attending: Neurosurgery | Admitting: Neurosurgery

## 2018-09-01 ENCOUNTER — Encounter (HOSPITAL_COMMUNITY): Payer: Self-pay

## 2018-09-01 DIAGNOSIS — Z01818 Encounter for other preprocedural examination: Secondary | ICD-10-CM | POA: Diagnosis not present

## 2018-09-01 LAB — CBC
HCT: 45.1 % (ref 39.0–52.0)
HEMOGLOBIN: 14.9 g/dL (ref 13.0–17.0)
MCH: 32 pg (ref 26.0–34.0)
MCHC: 33 g/dL (ref 30.0–36.0)
MCV: 97 fL (ref 80.0–100.0)
NRBC: 0 % (ref 0.0–0.2)
Platelets: 267 10*3/uL (ref 150–400)
RBC: 4.65 MIL/uL (ref 4.22–5.81)
RDW: 12.9 % (ref 11.5–15.5)
WBC: 6.8 10*3/uL (ref 4.0–10.5)

## 2018-09-01 LAB — SURGICAL PCR SCREEN
MRSA, PCR: NEGATIVE
STAPHYLOCOCCUS AUREUS: NEGATIVE

## 2018-09-01 LAB — BASIC METABOLIC PANEL
Anion gap: 4 — ABNORMAL LOW (ref 5–15)
BUN: 13 mg/dL (ref 8–23)
CHLORIDE: 106 mmol/L (ref 98–111)
CO2: 28 mmol/L (ref 22–32)
Calcium: 8.9 mg/dL (ref 8.9–10.3)
Creatinine, Ser: 0.78 mg/dL (ref 0.61–1.24)
GFR calc non Af Amer: >60 mL/min (ref >60)
Glucose, Bld: 92 mg/dL (ref 70–99)
POTASSIUM: 4.2 mmol/L (ref 3.5–5.1)
SODIUM: 138 mmol/L (ref 135–145)

## 2018-09-01 NOTE — Progress Notes (Signed)
PCP: Dr. Allyn Kenner  DM: denies  SA: denies  Pt denies SOB, cough, fever, chest pain  Pt states understanding of instructions given for DOS.

## 2018-09-09 DIAGNOSIS — E291 Testicular hypofunction: Secondary | ICD-10-CM | POA: Diagnosis not present

## 2018-09-10 ENCOUNTER — Inpatient Hospital Stay (HOSPITAL_COMMUNITY): Payer: PPO

## 2018-09-10 ENCOUNTER — Encounter (HOSPITAL_COMMUNITY): Payer: Self-pay | Admitting: *Deleted

## 2018-09-10 ENCOUNTER — Other Ambulatory Visit: Payer: Self-pay

## 2018-09-10 ENCOUNTER — Encounter (HOSPITAL_COMMUNITY)
Admission: RE | Disposition: A | Discharge status: Home / Self Care | Payer: Self-pay | Source: Home / Self Care | Attending: Neurosurgery

## 2018-09-10 ENCOUNTER — Inpatient Hospital Stay (HOSPITAL_COMMUNITY)
Admission: RE | Admit: 2018-09-10 | Discharge: 2018-09-12 | DRG: 460 | Disposition: A | Discharge status: Home / Self Care | Payer: PPO | Attending: Neurosurgery | Admitting: Neurosurgery

## 2018-09-10 DIAGNOSIS — Z87442 Personal history of urinary calculi: Secondary | ICD-10-CM | POA: Diagnosis not present

## 2018-09-10 DIAGNOSIS — Z79899 Other long term (current) drug therapy: Secondary | ICD-10-CM

## 2018-09-10 DIAGNOSIS — M549 Dorsalgia, unspecified: Secondary | ICD-10-CM | POA: Diagnosis present

## 2018-09-10 DIAGNOSIS — F329 Major depressive disorder, single episode, unspecified: Secondary | ICD-10-CM | POA: Diagnosis not present

## 2018-09-10 DIAGNOSIS — K219 Gastro-esophageal reflux disease without esophagitis: Secondary | ICD-10-CM | POA: Diagnosis not present

## 2018-09-10 DIAGNOSIS — Z981 Arthrodesis status: Secondary | ICD-10-CM | POA: Diagnosis not present

## 2018-09-10 DIAGNOSIS — Z79891 Long term (current) use of opiate analgesic: Secondary | ICD-10-CM

## 2018-09-10 DIAGNOSIS — Y792 Prosthetic and other implants, materials and accessory orthopedic devices associated with adverse incidents: Secondary | ICD-10-CM | POA: Diagnosis present

## 2018-09-10 DIAGNOSIS — T84098A Other mechanical complication of other internal joint prosthesis, initial encounter: Principal | ICD-10-CM | POA: Diagnosis present

## 2018-09-10 DIAGNOSIS — M439 Deforming dorsopathy, unspecified: Secondary | ICD-10-CM | POA: Diagnosis not present

## 2018-09-10 DIAGNOSIS — M438X6 Other specified deforming dorsopathies, lumbar region: Secondary | ICD-10-CM | POA: Diagnosis not present

## 2018-09-10 DIAGNOSIS — S32028A Other fracture of second lumbar vertebra, initial encounter for closed fracture: Secondary | ICD-10-CM | POA: Diagnosis not present

## 2018-09-10 DIAGNOSIS — S32021A Stable burst fracture of second lumbar vertebra, initial encounter for closed fracture: Secondary | ICD-10-CM | POA: Diagnosis not present

## 2018-09-10 DIAGNOSIS — G894 Chronic pain syndrome: Secondary | ICD-10-CM | POA: Diagnosis not present

## 2018-09-10 DIAGNOSIS — M5416 Radiculopathy, lumbar region: Secondary | ICD-10-CM | POA: Diagnosis not present

## 2018-09-10 DIAGNOSIS — D62 Acute posthemorrhagic anemia: Secondary | ICD-10-CM | POA: Diagnosis not present

## 2018-09-10 DIAGNOSIS — Z8711 Personal history of peptic ulcer disease: Secondary | ICD-10-CM

## 2018-09-10 DIAGNOSIS — Z419 Encounter for procedure for purposes other than remedying health state, unspecified: Secondary | ICD-10-CM

## 2018-09-10 DIAGNOSIS — F17290 Nicotine dependence, other tobacco product, uncomplicated: Secondary | ICD-10-CM | POA: Diagnosis not present

## 2018-09-10 DIAGNOSIS — Z885 Allergy status to narcotic agent status: Secondary | ICD-10-CM | POA: Diagnosis not present

## 2018-09-10 DIAGNOSIS — M1990 Unspecified osteoarthritis, unspecified site: Secondary | ICD-10-CM | POA: Diagnosis present

## 2018-09-10 DIAGNOSIS — F419 Anxiety disorder, unspecified: Secondary | ICD-10-CM | POA: Diagnosis not present

## 2018-09-10 HISTORY — PX: LAMINECTOMY WITH POSTERIOR LATERAL ARTHRODESIS LEVEL 4: SHX6338

## 2018-09-10 LAB — TYPE AND SCREEN
ABO/RH(D): A POS
Antibody Screen: NEGATIVE

## 2018-09-10 SURGERY — LAMINECTOMY WITH POSTERIOR LATERAL ARTHRODESIS LEVEL 4
Anesthesia: General | Site: Spine Lumbar

## 2018-09-10 MED ORDER — ARTIFICIAL TEARS OPHTHALMIC OINT
TOPICAL_OINTMENT | OPHTHALMIC | Status: DC | PRN
  Administered 2018-09-10: 1 via OPHTHALMIC

## 2018-09-10 MED ORDER — MIDAZOLAM HCL 5 MG/5ML IJ SOLN
INTRAMUSCULAR | Status: DC | PRN
  Administered 2018-09-10: 2 mg via INTRAVENOUS

## 2018-09-10 MED ORDER — HYDROMORPHONE HCL 1 MG/ML IJ SOLN
INTRAMUSCULAR | Status: AC
  Administered 2018-09-10: 0.5 mg via INTRAVENOUS
  Filled 2018-09-10: qty 1

## 2018-09-10 MED ORDER — SODIUM CHLORIDE 0.9 % IV SOLN: INTRAVENOUS | Status: DC

## 2018-09-10 MED ORDER — VECURONIUM BROMIDE 10 MG IV SOLR
INTRAVENOUS | Status: AC
  Filled 2018-09-10: qty 10

## 2018-09-10 MED ORDER — CEFAZOLIN SODIUM-DEXTROSE 2-4 GM/100ML-% IV SOLN
2.0000 g | INTRAVENOUS | Status: AC
  Administered 2018-09-10: 2 g via INTRAVENOUS
  Filled 2018-09-10: qty 100

## 2018-09-10 MED ORDER — ACETAMINOPHEN 10 MG/ML IV SOLN
INTRAVENOUS | Status: DC | PRN
  Administered 2018-09-10: 1000 mg via INTRAVENOUS

## 2018-09-10 MED ORDER — CHLORHEXIDINE GLUCONATE CLOTH 2 % EX PADS: 6.0000 | MEDICATED_PAD | Freq: Once | CUTANEOUS | Status: DC

## 2018-09-10 MED ORDER — ONDANSETRON HCL 4 MG/2ML IJ SOLN: 4.0000 mg | Freq: Four times a day (QID) | INTRAMUSCULAR | Status: DC | PRN

## 2018-09-10 MED ORDER — OXYCODONE HCL 5 MG PO TABS
ORAL_TABLET | ORAL | Status: AC
  Filled 2018-09-10: qty 1

## 2018-09-10 MED ORDER — LIDOCAINE-EPINEPHRINE 1 %-1:100000 IJ SOLN
INTRAMUSCULAR | Status: DC | PRN
  Administered 2018-09-10: 10 mL

## 2018-09-10 MED ORDER — METHOCARBAMOL 500 MG PO TABS
500.0000 mg | ORAL_TABLET | Freq: Four times a day (QID) | ORAL | Status: DC | PRN
  Administered 2018-09-11 – 2018-09-12 (×4): 500 mg via ORAL
  Filled 2018-09-10 (×4): qty 1

## 2018-09-10 MED ORDER — MEPERIDINE HCL 50 MG/ML IJ SOLN: 6.2500 mg | INTRAMUSCULAR | Status: DC | PRN

## 2018-09-10 MED ORDER — METHOCARBAMOL 1000 MG/10ML IJ SOLN
500.0000 mg | Freq: Four times a day (QID) | INTRAVENOUS | Status: DC | PRN
  Administered 2018-09-10 – 2018-09-11 (×2): 500 mg via INTRAVENOUS
  Filled 2018-09-10 (×3): qty 5

## 2018-09-10 MED ORDER — PROPOFOL 10 MG/ML IV BOLUS
INTRAVENOUS | Status: AC
  Filled 2018-09-10: qty 20

## 2018-09-10 MED ORDER — SODIUM CHLORIDE 0.9 % IV SOLN
INTRAVENOUS | Status: DC | PRN
  Administered 2018-09-10: 16:00:00

## 2018-09-10 MED ORDER — SODIUM CHLORIDE 0.9% FLUSH
3.0000 mL | Freq: Two times a day (BID) | INTRAVENOUS | Status: DC
  Administered 2018-09-11 (×2): 3 mL via INTRAVENOUS

## 2018-09-10 MED ORDER — KETAMINE HCL 50 MG/5ML IJ SOSY
PREFILLED_SYRINGE | INTRAMUSCULAR | Status: AC
  Filled 2018-09-10: qty 5

## 2018-09-10 MED ORDER — SODIUM CHLORIDE 0.9 % IV SOLN: 250.0000 mL | INTRAVENOUS | Status: DC

## 2018-09-10 MED ORDER — PHENOL 1.4 % MT LIQD: 1.0000 | OROMUCOSAL | Status: DC | PRN

## 2018-09-10 MED ORDER — MENTHOL 3 MG MT LOZG: 1.0000 | LOZENGE | OROMUCOSAL | Status: DC | PRN

## 2018-09-10 MED ORDER — SODIUM CHLORIDE 0.9% FLUSH: 3.0000 mL | INTRAVENOUS | Status: DC | PRN

## 2018-09-10 MED ORDER — LACTATED RINGERS IV SOLN
INTRAVENOUS | Status: DC
  Administered 2018-09-10 (×2): via INTRAVENOUS

## 2018-09-10 MED ORDER — VECURONIUM BROMIDE 10 MG IV SOLR
INTRAVENOUS | Status: DC | PRN
  Administered 2018-09-10: 2 mg via INTRAVENOUS

## 2018-09-10 MED ORDER — THROMBIN 20000 UNITS EX SOLR
CUTANEOUS | Status: AC
  Filled 2018-09-10: qty 20000

## 2018-09-10 MED ORDER — LIDOCAINE 2% (20 MG/ML) 5 ML SYRINGE
INTRAMUSCULAR | Status: AC
  Filled 2018-09-10: qty 5

## 2018-09-10 MED ORDER — OXYCODONE HCL 5 MG PO TABS
10.0000 mg | ORAL_TABLET | ORAL | Status: DC | PRN
  Administered 2018-09-10: 10 mg via ORAL
  Administered 2018-09-10 (×2): 5 mg via ORAL
  Administered 2018-09-11 – 2018-09-12 (×8): 10 mg via ORAL
  Filled 2018-09-10 (×9): qty 2

## 2018-09-10 MED ORDER — HYDROMORPHONE HCL 1 MG/ML IJ SOLN
0.2500 mg | INTRAMUSCULAR | Status: DC | PRN
  Administered 2018-09-10 (×4): 0.5 mg via INTRAVENOUS

## 2018-09-10 MED ORDER — ROCURONIUM BROMIDE 100 MG/10ML IV SOLN
INTRAVENOUS | Status: DC | PRN
  Administered 2018-09-10: 30 mg via INTRAVENOUS
  Administered 2018-09-10: 20 mg via INTRAVENOUS
  Administered 2018-09-10: 50 mg via INTRAVENOUS

## 2018-09-10 MED ORDER — BUPIVACAINE LIPOSOME 1.3 % IJ SUSP
INTRAMUSCULAR | Status: DC | PRN
  Administered 2018-09-10: 20 mL

## 2018-09-10 MED ORDER — ONDANSETRON HCL 4 MG/2ML IJ SOLN
INTRAMUSCULAR | Status: DC | PRN
  Administered 2018-09-10: 4 mg via INTRAVENOUS

## 2018-09-10 MED ORDER — THROMBIN 5000 UNITS EX SOLR
CUTANEOUS | Status: AC
  Filled 2018-09-10: qty 5000

## 2018-09-10 MED ORDER — CEFAZOLIN SODIUM-DEXTROSE 2-4 GM/100ML-% IV SOLN
2.0000 g | Freq: Three times a day (TID) | INTRAVENOUS | Status: AC
  Administered 2018-09-10 – 2018-09-11 (×2): 2 g via INTRAVENOUS
  Filled 2018-09-10 (×2): qty 100

## 2018-09-10 MED ORDER — FENTANYL CITRATE (PF) 100 MCG/2ML IJ SOLN
INTRAMUSCULAR | Status: DC | PRN
  Administered 2018-09-10: 50 ug via INTRAVENOUS
  Administered 2018-09-10: 250 ug via INTRAVENOUS
  Administered 2018-09-10 (×4): 50 ug via INTRAVENOUS

## 2018-09-10 MED ORDER — ONDANSETRON HCL 4 MG PO TABS: 4.0000 mg | ORAL_TABLET | Freq: Four times a day (QID) | ORAL | Status: DC | PRN

## 2018-09-10 MED ORDER — DOCUSATE SODIUM 100 MG PO CAPS
100.0000 mg | ORAL_CAPSULE | Freq: Two times a day (BID) | ORAL | Status: DC
  Administered 2018-09-10 – 2018-09-12 (×4): 100 mg via ORAL
  Filled 2018-09-10 (×4): qty 1

## 2018-09-10 MED ORDER — PROMETHAZINE HCL 25 MG/ML IJ SOLN: 6.2500 mg | INTRAMUSCULAR | Status: DC | PRN

## 2018-09-10 MED ORDER — PANTOPRAZOLE SODIUM 40 MG IV SOLR: 40.0000 mg | Freq: Every day | INTRAVENOUS | Status: DC

## 2018-09-10 MED ORDER — KETAMINE HCL 10 MG/ML IJ SOLN
INTRAMUSCULAR | Status: DC | PRN
  Administered 2018-09-10: 10 mg via INTRAVENOUS
  Administered 2018-09-10 (×2): 20 mg via INTRAVENOUS

## 2018-09-10 MED ORDER — GABAPENTIN 300 MG PO CAPS
300.0000 mg | ORAL_CAPSULE | Freq: Once | ORAL | Status: AC
  Administered 2018-09-10: 300 mg via ORAL

## 2018-09-10 MED ORDER — ROCURONIUM BROMIDE 50 MG/5ML IV SOSY
PREFILLED_SYRINGE | INTRAVENOUS | Status: AC
  Filled 2018-09-10: qty 5

## 2018-09-10 MED ORDER — OXYCODONE HCL 5 MG PO TABS
ORAL_TABLET | ORAL | Status: AC
  Administered 2018-09-10: 5 mg via ORAL
  Filled 2018-09-10: qty 1

## 2018-09-10 MED ORDER — CYCLOBENZAPRINE HCL 10 MG PO TABS
ORAL_TABLET | ORAL | Status: AC
  Administered 2018-09-10: 10 mg via ORAL
  Filled 2018-09-10: qty 1

## 2018-09-10 MED ORDER — GABAPENTIN 300 MG PO CAPS
600.0000 mg | ORAL_CAPSULE | Freq: Every day | ORAL | Status: DC
  Administered 2018-09-10 – 2018-09-11 (×2): 600 mg via ORAL
  Filled 2018-09-10 (×2): qty 2

## 2018-09-10 MED ORDER — DEXAMETHASONE SODIUM PHOSPHATE 10 MG/ML IJ SOLN
10.0000 mg | INTRAMUSCULAR | Status: DC
  Filled 2018-09-10: qty 1

## 2018-09-10 MED ORDER — THROMBIN 5000 UNITS EX SOLR
OROMUCOSAL | Status: DC | PRN
  Administered 2018-09-10: 16:00:00

## 2018-09-10 MED ORDER — PANTOPRAZOLE SODIUM 40 MG PO TBEC
40.0000 mg | DELAYED_RELEASE_TABLET | Freq: Every day | ORAL | Status: DC
  Administered 2018-09-10 – 2018-09-11 (×2): 40 mg via ORAL
  Filled 2018-09-10 (×2): qty 1

## 2018-09-10 MED ORDER — HYDROMORPHONE HCL 1 MG/ML IJ SOLN
1.0000 mg | INTRAMUSCULAR | Status: DC | PRN
  Administered 2018-09-10 – 2018-09-11 (×4): 1 mg via INTRAVENOUS
  Filled 2018-09-10 (×4): qty 1

## 2018-09-10 MED ORDER — CYCLOBENZAPRINE HCL 10 MG PO TABS
10.0000 mg | ORAL_TABLET | Freq: Three times a day (TID) | ORAL | Status: DC | PRN
  Administered 2018-09-10: 10 mg via ORAL

## 2018-09-10 MED ORDER — 0.9 % SODIUM CHLORIDE (POUR BTL) OPTIME
TOPICAL | Status: DC | PRN
  Administered 2018-09-10 (×2): 1000 mL

## 2018-09-10 MED ORDER — ACETAMINOPHEN 650 MG RE SUPP
650.0000 mg | RECTAL | Status: DC | PRN
  Filled 2018-09-10: qty 1

## 2018-09-10 MED ORDER — FENTANYL CITRATE (PF) 250 MCG/5ML IJ SOLN
INTRAMUSCULAR | Status: AC
  Filled 2018-09-10: qty 5

## 2018-09-10 MED ORDER — VANCOMYCIN HCL 1000 MG IV SOLR
INTRAVENOUS | Status: AC
  Filled 2018-09-10: qty 1000

## 2018-09-10 MED ORDER — ACETAMINOPHEN 325 MG PO TABS
650.0000 mg | ORAL_TABLET | ORAL | Status: DC | PRN
  Administered 2018-09-10 – 2018-09-12 (×8): 650 mg via ORAL
  Filled 2018-09-10 (×8): qty 2

## 2018-09-10 MED ORDER — ACETAMINOPHEN 10 MG/ML IV SOLN
INTRAVENOUS | Status: AC
  Filled 2018-09-10: qty 100

## 2018-09-10 MED ORDER — PROPOFOL 10 MG/ML IV BOLUS
INTRAVENOUS | Status: DC | PRN
  Administered 2018-09-10: 200 mg via INTRAVENOUS

## 2018-09-10 MED ORDER — LIDOCAINE HCL (CARDIAC) PF 100 MG/5ML IV SOSY
PREFILLED_SYRINGE | INTRAVENOUS | Status: DC | PRN
  Administered 2018-09-10: 20 mg via INTRAVENOUS

## 2018-09-10 MED ORDER — BUPIVACAINE LIPOSOME 1.3 % IJ SUSP
20.0000 mL | INTRAMUSCULAR | Status: DC
  Filled 2018-09-10: qty 20

## 2018-09-10 MED ORDER — SUGAMMADEX SODIUM 200 MG/2ML IV SOLN
INTRAVENOUS | Status: DC | PRN
  Administered 2018-09-10: 200 mg via INTRAVENOUS

## 2018-09-10 MED ORDER — HYDROCODONE-ACETAMINOPHEN 5-325 MG PO TABS: 1.0000 | ORAL_TABLET | ORAL | Status: DC | PRN

## 2018-09-10 MED ORDER — MIDAZOLAM HCL 2 MG/2ML IJ SOLN
INTRAMUSCULAR | Status: AC
  Filled 2018-09-10: qty 2

## 2018-09-10 MED ORDER — BISACODYL 5 MG PO TBEC: 5.0000 mg | DELAYED_RELEASE_TABLET | Freq: Every day | ORAL | Status: DC | PRN

## 2018-09-10 MED ORDER — THROMBIN 20000 UNITS EX SOLR
CUTANEOUS | Status: DC | PRN
  Administered 2018-09-10: 16:00:00

## 2018-09-10 MED ORDER — MIDAZOLAM HCL 2 MG/2ML IJ SOLN: 0.5000 mg | Freq: Once | INTRAMUSCULAR | Status: DC | PRN

## 2018-09-10 MED ORDER — DEXAMETHASONE SODIUM PHOSPHATE 4 MG/ML IJ SOLN
INTRAMUSCULAR | Status: DC | PRN
  Administered 2018-09-10: 10 mg via INTRAVENOUS

## 2018-09-10 MED ORDER — BUPIVACAINE HCL (PF) 0.5 % IJ SOLN
INTRAMUSCULAR | Status: DC | PRN
  Administered 2018-09-10: 10 mL

## 2018-09-10 MED ORDER — VANCOMYCIN HCL 1000 MG IV SOLR
INTRAVENOUS | Status: DC | PRN
  Administered 2018-09-10: 1000 mg

## 2018-09-10 MED ORDER — GABAPENTIN 300 MG PO CAPS
300.0000 mg | ORAL_CAPSULE | ORAL | Status: DC
  Administered 2018-09-11 – 2018-09-12 (×3): 300 mg via ORAL
  Filled 2018-09-10 (×3): qty 1

## 2018-09-10 MED ORDER — GABAPENTIN 300 MG PO CAPS
ORAL_CAPSULE | ORAL | Status: AC
  Filled 2018-09-10: qty 1

## 2018-09-10 MED ORDER — BUPIVACAINE HCL (PF) 0.5 % IJ SOLN
INTRAMUSCULAR | Status: AC
  Filled 2018-09-10: qty 30

## 2018-09-10 MED ORDER — LIDOCAINE-EPINEPHRINE 1 %-1:100000 IJ SOLN
INTRAMUSCULAR | Status: AC
  Filled 2018-09-10: qty 1

## 2018-09-10 MED ORDER — SENNA 8.6 MG PO TABS
1.0000 | ORAL_TABLET | Freq: Two times a day (BID) | ORAL | Status: DC
  Administered 2018-09-10 – 2018-09-12 (×4): 8.6 mg via ORAL
  Filled 2018-09-10 (×3): qty 1

## 2018-09-10 SURGICAL SUPPLY — 84 items
BAG DECANTER FOR FLEXI CONT (MISCELLANEOUS) ×3 IMPLANT
BENZOIN TINCTURE PRP APPL 2/3 (GAUZE/BANDAGES/DRESSINGS) ×3 IMPLANT
BLADE CLIPPER SURG (BLADE) IMPLANT
BLADE SURG 11 STRL SS (BLADE) ×3 IMPLANT
BONE VIVIGEN FORMABLE 10CC (Bone Implant) ×3 IMPLANT
BUR CUTTER 7.0 ROUND (BURR) ×3 IMPLANT
BUR MATCHSTICK NEURO 3.0 LAGG (BURR) ×3 IMPLANT
CANISTER SUCT 3000ML PPV (MISCELLANEOUS) ×3 IMPLANT
CAP LOCKING REVERE (Cap) ×6 IMPLANT
CAP LOCKING THREADED (Cap) ×18 IMPLANT
CARTRIDGE OIL MAESTRO DRILL (MISCELLANEOUS) ×1 IMPLANT
CLOSURE WOUND 1/2 X4 (GAUZE/BANDAGES/DRESSINGS) ×1
CONT SPEC 4OZ CLIKSEAL STRL BL (MISCELLANEOUS) ×6 IMPLANT
COVER BACK TABLE 60X90IN (DRAPES) ×3 IMPLANT
COVER WAND RF STERILE (DRAPES) IMPLANT
DECANTER SPIKE VIAL GLASS SM (MISCELLANEOUS) ×3 IMPLANT
DERMABOND ADVANCED (GAUZE/BANDAGES/DRESSINGS) ×2
DERMABOND ADVANCED .7 DNX12 (GAUZE/BANDAGES/DRESSINGS) ×1 IMPLANT
DIFFUSER DRILL AIR PNEUMATIC (MISCELLANEOUS) ×3 IMPLANT
DRAPE C-ARM 42X72 X-RAY (DRAPES) ×3 IMPLANT
DRAPE C-ARMOR (DRAPES) ×3 IMPLANT
DRAPE HALF SHEET 40X57 (DRAPES) IMPLANT
DRAPE LAPAROTOMY 100X72X124 (DRAPES) ×3 IMPLANT
DRAPE SURG 17X23 STRL (DRAPES) ×3 IMPLANT
DRSG OPSITE 4X5.5 SM (GAUZE/BANDAGES/DRESSINGS) ×3 IMPLANT
DRSG OPSITE POSTOP 4X10 (GAUZE/BANDAGES/DRESSINGS) ×3 IMPLANT
DRSG OPSITE POSTOP 4X8 (GAUZE/BANDAGES/DRESSINGS) ×3 IMPLANT
DURAPREP 26ML APPLICATOR (WOUND CARE) ×3 IMPLANT
ELECT REM PT RETURN 9FT ADLT (ELECTROSURGICAL) ×3
ELECTRODE REM PT RTRN 9FT ADLT (ELECTROSURGICAL) ×1 IMPLANT
EVACUATOR 3/16  PVC DRAIN (DRAIN) ×2
EVACUATOR 3/16 PVC DRAIN (DRAIN) ×1 IMPLANT
FIBER BOAT ALLOFUSE 15CC (Bone Implant) ×6 IMPLANT
GAUZE 4X4 16PLY RFD (DISPOSABLE) ×3 IMPLANT
GAUZE SPONGE 4X4 12PLY STRL (GAUZE/BANDAGES/DRESSINGS) ×3 IMPLANT
GLOVE BIO SURGEON STRL SZ 6.5 (GLOVE) ×6 IMPLANT
GLOVE BIO SURGEON STRL SZ7 (GLOVE) ×3 IMPLANT
GLOVE BIO SURGEON STRL SZ8 (GLOVE) ×6 IMPLANT
GLOVE BIO SURGEONS STRL SZ 6.5 (GLOVE) ×3
GLOVE BIOGEL PI IND STRL 6.5 (GLOVE) ×2 IMPLANT
GLOVE BIOGEL PI IND STRL 7.0 (GLOVE) ×1 IMPLANT
GLOVE BIOGEL PI IND STRL 7.5 (GLOVE) ×6 IMPLANT
GLOVE BIOGEL PI IND STRL 8 (GLOVE) ×2 IMPLANT
GLOVE BIOGEL PI INDICATOR 6.5 (GLOVE) ×4
GLOVE BIOGEL PI INDICATOR 7.0 (GLOVE) ×2
GLOVE BIOGEL PI INDICATOR 7.5 (GLOVE) ×12
GLOVE BIOGEL PI INDICATOR 8 (GLOVE) ×4
GLOVE INDICATOR 8.5 STRL (GLOVE) ×6 IMPLANT
GOWN STRL REUS W/ TWL LRG LVL3 (GOWN DISPOSABLE) ×3 IMPLANT
GOWN STRL REUS W/ TWL XL LVL3 (GOWN DISPOSABLE) ×2 IMPLANT
GOWN STRL REUS W/TWL 2XL LVL3 (GOWN DISPOSABLE) IMPLANT
GOWN STRL REUS W/TWL LRG LVL3 (GOWN DISPOSABLE) ×6
GOWN STRL REUS W/TWL XL LVL3 (GOWN DISPOSABLE) ×4
GRAFT BN 10X1XDBM MAGNIFUSE (Bone Implant) ×1 IMPLANT
GRAFT BONE MAGNIFUSE 1X10CM (Bone Implant) ×2 IMPLANT
HEMOSTAT POWDER KIT SURGIFOAM (HEMOSTASIS) ×3 IMPLANT
KIT BASIN OR (CUSTOM PROCEDURE TRAY) ×3 IMPLANT
KIT INFUSE MEDIUM (Orthopedic Implant) ×3 IMPLANT
KIT POSITION SURG JACKSON T1 (MISCELLANEOUS) ×3 IMPLANT
KIT TURNOVER KIT B (KITS) ×3 IMPLANT
MILL MEDIUM DISP (BLADE) ×3 IMPLANT
NEEDLE HYPO 21X1.5 SAFETY (NEEDLE) ×3 IMPLANT
NEEDLE HYPO 25X1 1.5 SAFETY (NEEDLE) ×3 IMPLANT
NS IRRIG 1000ML POUR BTL (IV SOLUTION) ×6 IMPLANT
OIL CARTRIDGE MAESTRO DRILL (MISCELLANEOUS) ×3
PACK LAMINECTOMY NEURO (CUSTOM PROCEDURE TRAY) ×3 IMPLANT
PAD ARMBOARD 7.5X6 YLW CONV (MISCELLANEOUS) ×9 IMPLANT
ROD HEXENDED ALLOY 5.5X500MM (Rod) ×3 IMPLANT
SCREW AMP MODULAR CREO 6.5X45 (Screw) ×6 IMPLANT
SCREW CREO 6.5X40 (Screw) ×6 IMPLANT
SCREW CREO MODULAR 7.5X45 (Screw) ×6 IMPLANT
SCREW PA THRD CREO TULIP 5.5X4 (Head) ×18 IMPLANT
SPONGE LAP 4X18 RFD (DISPOSABLE) IMPLANT
SPONGE SURGIFOAM ABS GEL 100 (HEMOSTASIS) ×3 IMPLANT
STRIP CLOSURE SKIN 1/2X4 (GAUZE/BANDAGES/DRESSINGS) ×2 IMPLANT
SUT VIC AB 0 CT1 18XCR BRD8 (SUTURE) ×2 IMPLANT
SUT VIC AB 0 CT1 8-18 (SUTURE) ×4
SUT VIC AB 2-0 CT1 18 (SUTURE) ×6 IMPLANT
SUT VIC AB 4-0 PS2 27 (SUTURE) ×3 IMPLANT
SYR 20CC LL (SYRINGE) ×3 IMPLANT
TOWEL GREEN STERILE (TOWEL DISPOSABLE) ×3 IMPLANT
TOWEL GREEN STERILE FF (TOWEL DISPOSABLE) ×3 IMPLANT
TRAY FOLEY MTR SLVR 16FR STAT (SET/KITS/TRAYS/PACK) ×3 IMPLANT
WATER STERILE IRR 1000ML POUR (IV SOLUTION) ×3 IMPLANT

## 2018-09-10 NOTE — Addendum Note (Signed)
Addendum  created 09/10/18 1837 by Candis Shine, CRNA   Intraprocedure Flowsheets edited

## 2018-09-10 NOTE — H&P (Signed)
Bradley Espinoza is an 62 y.o. male.   Chief Complaint: back pain HPI: 62 year old gentleman with long-standing history history with his back on it has undergone multiple operations the most recent of which was an L3 for anterolateral interbody fusion. He has a history remotely of an L2 burst fracture and is developed a deformity and is now having pain around the L1-2 and L2-3 facet joints related to that fracture. He has held and fuse this and somewhat of a deformity posture however due to his mobile facet joints and pain referable at L1-L2 I've recommended fusion across the brachial lumbar segment with placement of pedicle screws at T11-T12 and tying into his old construct at L3-4. We will look and see if we can get screws at L1  but may not be able to due to minimal residual pedicle remaining from his previous screw placement.I've extensively gone over the risks and benefits of the operation with him as well as perioperative course expectations of outcome and alternatives of surgery he understands and agrees to proceed 4.  Past Medical History:  Diagnosis Date  . Anxiety   . Arthritis    low back pain, lumbar radiculopathy  . Depression   . Fracture    B/L ankles  . GERD (gastroesophageal reflux disease)   . Headache(784.0)    allergy related   . History of kidney stones   . History of stomach ulcers   . Retained orthopedic hardware    failed retained hardware right foot  . Wears glasses     Past Surgical History:  Procedure Laterality Date  . ANKLE FUSION Right 05/11/2015   Procedure: Right Posterior Arthroscopic Subtalar Arthrodesis;  Surgeon: Newt Minion, MD;  Location: [facility];  Service: Orthopedics;  Laterality: Right;  . ANKLE FUSION Right 11/27/2017   Procedure: RIGHT TIBIOCALCANEAL FUSION;  Surgeon: Newt Minion, MD;  Location: [place];  Service: Orthopedics;  Laterality: Right;  . ANTERIOR LAT LUMBAR FUSION N/A 04/05/2017   Procedure: Extreme Lateral Interbody Fusion - Lumbar  three-lumbar four ,exploration of fusion Posterior augmentation with globus addition Removal hardware Lumbar one-three. Lumbar four-sacral one,  Removal internal bone growth stimulator;  Surgeon: Kary Kos, MD;  Location: [facility];  Service: Neurosurgery;  Laterality: N/A;  . BACK SURGERY  2004   x 2  . CERVICAL SPINE SURGERY  2008  . ESOPHAGOGASTRODUODENOSCOPY    . HARDWARE REMOVAL Right 10/09/2014   Procedure: Removal Deep Hardware, Irrigation and Debridement Calcaneus, Place Antibiotic Beads and Wound VAC ;  Surgeon: Newt Minion, MD;  Location: [facility];  Service: Orthopedics;  Laterality: Right;  . HARDWARE REMOVAL Right 08/12/2015   Procedure: Removal Deep Hardware Right Foot;  Surgeon: Newt Minion, MD;  Location: [facility];  Service: Orthopedics;  Laterality: Right;  . I&D EXTREMITY Right 09/15/2014   Procedure: IRRIGATION AND DEBRIDEMENT Ankle;  Surgeon: Renette Butters, MD;  Location: [facility];  Service: Orthopedics;  Laterality: Right;  . INGUINAL HERNIA REPAIR Bilateral   . LUMBAR PERCUTANEOUS PEDICLE SCREW 1 LEVEL N/A 04/05/2017   Procedure: LUMBAR PERCUTANEOUS PEDICLE SCREW LUMBAR THREE-FOUR;  Surgeon: Kary Kos, MD;  Location: [facility];  Service: Neurosurgery;  Laterality: N/A;  . ORIF CALCANEOUS FRACTURE Right 09/19/2014   Procedure: OPEN REDUCTION INTERNAL FIXATION (ORIF) CALCANEOUS FRACTURE;  Surgeon: Newt Minion, MD;  Location: [facility];  Service: Orthopedics;  Laterality: Right;  . ORIF CALCANEOUS FRACTURE Left 09/19/2014   Procedure: OPEN REDUCTION INTERNAL FIXATION (ORIF) CALCANEOUS FRACTURE;  Surgeon:  Newt Minion, MD;  Location: [facility];  Service: Orthopedics;  Laterality: Left;    Family History  Problem Relation Age of Onset  . Diabetes Father   . Cancer Other    Social History:  reports that he has been smoking cigars. He started smoking about 41 years ago. He has a 10.00 pack-year smoking history. He has never used smokeless tobacco. He reports that he does not drink alcohol or use  drugs.  Allergies:  Allergies  Allergen Reactions  . Codeine Nausea And Vomiting    Medications Prior to Admission  Medication Sig Dispense Refill  . gabapentin (NEURONTIN) 300 MG capsule Take 300 mg by mouth See admin instructions. Take 300mg  by mouth in the morning and afternoon, take 600 mg at bedtime    . ibuprofen (ADVIL,MOTRIN) 200 MG tablet Take 800 mg by mouth every 6 (six) hours as needed for headache or moderate pain.     . meloxicam (MOBIC) 15 MG tablet Take 15 mg by mouth daily.    Marland Kitchen oxyCODONE-acetaminophen (PERCOCET) 10-325 MG tablet Take 1 tablet by mouth every 4 (four) hours as needed for pain. (Patient taking differently: Take 1 tablet by mouth every 6 (six) hours as needed for pain. ) 60 tablet 0  . ranitidine (ZANTAC) 75 MG tablet Take 150 mg by mouth daily as needed for heartburn.     Marland Kitchen tiZANidine (ZANAFLEX) 2 MG tablet Take 2-4 mg by mouth every 8 (eight) hours as needed for muscle spasms.     . cyclobenzaprine (FLEXERIL) 10 MG tablet Take 1 tablet (10 mg total) by mouth 3 (three) times daily as needed for muscle spasms. (Patient not taking: Reported on 08/26/2018) 60 tablet 2  . oxyCODONE-acetaminophen (PERCOCET/ROXICET) 5-325 MG tablet Take 1 tablet by mouth every 4 (four) hours as needed for severe pain. (Patient not taking: Reported on 08/26/2018) 30 tablet 0    Results for orders placed or performed during the hospital encounter of 09/10/18 (from the past 48 hour(s))  Type and screen [place]     Status: None   Collection Time: 09/10/18 11:30 AM  Result Value Ref Range   ABO/RH(D) A POS    Antibody Screen NEG    Sample Expiration      09/13/2018 Performed at [facility], [place] [address], [place], [place] [address]    No results found.  Review of Systems  Musculoskeletal: Positive for back pain.  Neurological: Positive for sensory change.    Blood pressure (!) 158/87, pulse 70, temperature 98 F (36.7 C), temperature source  Oral, resp. rate (!) 99, height 6' (1.829 m), weight 76.4 kg, SpO2 (!) 20 %. Physical Exam  Constitutional: He is oriented to person, place, and time. He appears well-developed and well-nourished.  HENT:  Head: Normocephalic.  Eyes: Pupils are equal, round, and reactive to light.  Neck: Normal range of motion.  Cardiovascular: Normal rate.  Respiratory: Breath sounds normal.  GI: Soft. Bowel sounds are normal.  Neurological: He is alert and oriented to person, place, and time.  Skin: Skin is warm and dry.     Assessment/Plan 62 year old gentleman plate presents for a right lumbar pedicle screw fixation.  Deone Omahoney P, MD 09/10/2018, 1:22 PM

## 2018-09-10 NOTE — Anesthesia Preprocedure Evaluation (Addendum)
Anesthesia Evaluation  Patient identified by MRN, date of birth, ID band Patient awake    Reviewed: Allergy & Precautions, NPO status , Patient's Chart, lab work & pertinent test results  History of Anesthesia Complications Negative for: history of anesthetic complications  Airway Mallampati: II  TM Distance: >3 FB Neck ROM: Full    Dental  (+) Partial Upper, Partial Lower, Dental Advisory Given, Missing   Pulmonary Current Smoker,    breath sounds clear to auscultation       Cardiovascular (-) anginanegative cardio ROS   Rhythm:Regular Rate:Normal     Neuro/Psych  Headaches, Anxiety Depression Back pain: narcotics    GI/Hepatic Neg liver ROS, GERD  Medicated and Controlled,  Endo/Other  negative endocrine ROS  Renal/GU negative Renal ROS     Musculoskeletal  (+) Arthritis , Osteoarthritis,    Abdominal   Peds  Hematology negative hematology ROS (+)   Anesthesia Other Findings   Reproductive/Obstetrics                            Anesthesia Physical Anesthesia Plan  ASA: III  Anesthesia Plan: General   Post-op Pain Management:    Induction: Intravenous  PONV Risk Score and Plan: 2 and Ondansetron and Dexamethasone  Airway Management Planned: Oral ETT  Additional Equipment:   Intra-op Plan:   Post-operative Plan: Extubation in OR  Informed Consent: I have reviewed the patients History and Physical, chart, labs and discussed the procedure including the risks, benefits and alternatives for the proposed anesthesia with the patient or authorized representative who has indicated his/her understanding and acceptance.   Dental advisory given  Plan Discussed with: CRNA and Surgeon  Anesthesia Plan Comments: (Plan routine monitors, GETA)        Anesthesia Quick Evaluation

## 2018-09-10 NOTE — Anesthesia Postprocedure Evaluation (Signed)
Anesthesia Post Note  Patient: EAGAN SHIFFLETT  Procedure(s) Performed: Posterior Lateral Fusion - Thoracic Eleven-Thoracic Twelve - Thoracic Twelve-Lumbar One - Lumbar One-Lumbar Two - Lumbar Two-Lumbar Three with instrumentaion and PLA (N/A Spine Lumbar)     Patient location during evaluation: PACU Anesthesia Type: General Level of consciousness: awake and alert, oriented and patient cooperative Pain control: pain improving. Vital Signs Assessment: post-procedure vital signs reviewed and stable Respiratory status: spontaneous breathing, nonlabored ventilation, respiratory function stable and patient connected to nasal cannula oxygen Cardiovascular status: blood pressure returned to baseline and stable Postop Assessment: no apparent nausea or vomiting Anesthetic complications: no    Last Vitals:  Vitals:   09/10/18 1800 09/10/18 1815  BP: (!) 186/101 (!) 166/96  Pulse: 83 79  Resp: 12 14  Temp:    SpO2: 98% 98%    Last Pain:  Vitals:   09/10/18 1810  TempSrc:   PainSc: 8                  Angely Dietz,E. Punam Broussard

## 2018-09-10 NOTE — Transfer of Care (Signed)
Immediate Anesthesia Transfer of Care Note  Patient: Bradley Espinoza  Procedure(s) Performed: Posterior Lateral Fusion - Thoracic Eleven-Thoracic Twelve - Thoracic Twelve-Lumbar One - Lumbar One-Lumbar Two - Lumbar Two-Lumbar Three with instrumentaion and PLA (N/A Spine Lumbar)  Patient Location: PACU  Anesthesia Type:General  Level of Consciousness: awake, alert  and oriented  Airway & Oxygen Therapy: Patient Spontanous Breathing and Patient connected to nasal cannula oxygen  Post-op Assessment: Report given to RN and Post -op Vital signs reviewed and stable  Post vital signs: Reviewed and stable  Last Vitals:  Vitals Value Taken Time  BP 167/96 09/10/2018  5:41 PM  Temp    Pulse 86 09/10/2018  5:45 PM  Resp 23 09/10/2018  5:45 PM  SpO2 99 % 09/10/2018  5:45 PM  Vitals shown include unvalidated device data.  Last Pain:  Vitals:   09/10/18 1153  TempSrc:   PainSc: 8          Complications: No apparent anesthesia complications

## 2018-09-10 NOTE — Anesthesia Procedure Notes (Signed)
Procedure Name: Intubation Date/Time: 09/10/2018 2:40 PM Performed by: Candis Shine, CRNA Pre-anesthesia Checklist: Patient identified, Emergency Drugs available, Suction available and Patient being monitored Patient Re-evaluated:Patient Re-evaluated prior to induction Oxygen Delivery Method: Circle System Utilized Preoxygenation: Pre-oxygenation with 100% oxygen Induction Type: IV induction Ventilation: Mask ventilation without difficulty and Oral airway inserted - appropriate to patient size Laryngoscope Size: Mac and 4 Grade View: Grade II Tube type: Oral Number of attempts: 1 Airway Equipment and Method: Stylet and Oral airway Placement Confirmation: ETT inserted through vocal cords under direct vision,  positive ETCO2 and breath sounds checked- equal and bilateral Secured at: 21 cm Tube secured with: Tape Dental Injury: Teeth and Oropharynx as per pre-operative assessment

## 2018-09-10 NOTE — Op Note (Signed)
Preoperative diagnosis: L2 burst fracture spinal deformity and back pain.  Postoperative diagnosis: Same  Procedure: T11-L4 nonsegmental adequate screw fixation with posterior lateral arthrodesis utilizing BMP,vivigen, magnafuse and cortical fiber boats.  Surgeon: Dominica Severin Kashvi Prevette  Asst.: Glenford Peers  Anesthesia: Gen.  EBL: Minimal  History of present illness:62 year old gentleman with long-standing issues with his back previously undergone L1 L3 fusion for his L2 burst fracture which healed burst fracture or however healed in a kyphotic deformity and patient is a progress worsening back pain that's been referable to T12-L1 and L1-L2 facet joints confirmed with serial facet blocks. Due to patient progressive conical syndrome failure conservative treatment and is facet media back pain above his kyphotic deformity at L2 I recommended thoraco-lumbar fusion with hopeful reduction of his deformity. I extensively with the risks and benefits of this procedure with him as well as perioperative course expectations of outcome and alternatives of surgery and he understood and agreed to proceed 4.  Operative procedure: Patient brought into the or was induced under general anesthesia positioned prone on the [facility] table his back was prepped and draped in routine sterile fashion is old incision was opened up and extended cephalad subperiosteal dissections care lamina of T10, T11, T12 down through L1 exposing the hardware down to L3-L4. Identify the old pedicle screw holes at L1 with fluoroscopy and during the dissection. Identified pedicle entry points at T12 and T11 so under AP and lateral fluoroscopy in succession of placed bilateral pedicle screws at T11, T12, and replaced screws at L1 I used 7.5 x 45 mm screws at L1 6.5 x 45 at T12 and 6.5 x 40 at T11. Screws were placed and fluoroscopy confirmed good position I aggressively decorticated the TPs laminar complexes from T11 down to L4 placed the BMP the vivid GEN  magna fuse and the cortical fiber benefits posterior laterally from the TPs of L4 all way up along the TPs laminar complexes at T11. Then contoured and cut to rods anchored everything in place I did compress and reduce the kyphosis of across the L2 segment at both between 1 and 3 and between 12 and 1. Then after it was anchored in place the place a large Hemovac drain spring: Vancomycin powder and the wound injected X Barela fashion) layers with after Vicryl running 4 subcuticular Dermabond benzo and Steri-Strips and sterile dressing. At the end of case on it counts sponge counts were correct.

## 2018-09-11 ENCOUNTER — Encounter (HOSPITAL_COMMUNITY): Payer: Self-pay | Admitting: Neurosurgery

## 2018-09-11 ENCOUNTER — Other Ambulatory Visit: Payer: Self-pay

## 2018-09-11 MED ORDER — PNEUMOCOCCAL VAC POLYVALENT 25 MCG/0.5ML IJ INJ
0.5000 mL | INJECTION | INTRAMUSCULAR | Status: DC
  Filled 2018-09-11: qty 0.5

## 2018-09-11 NOTE — Evaluation (Signed)
Occupational Therapy Evaluation Patient Details Name: Bradley Espinoza MRN: [id number] DOB: August 03, 1956 Today's Date: 09/11/2018    History of Present Illness Pt is a 62 y/o male now s/p T11-L4 nonsegmental adequate screw fixation with posterior lateral arthrodesis due to recent L2 burst fracture spinal deformity and back pain. PMHx includes multiple spinal surgeries, R ankle fusion (x2), depression, cervical surgery    Clinical Impression   This 62 y/o male presents with the above. At baseline pt reports he is mod independent with ADLs and functional mobility using RW/SPC. Pt performing functional mobility this session with overall minguard assist using RW. He currently requires minguard-minA for UB and LB ADLs. Reviewed back precautions, brace management, safety and compensatory strategies for performing ADLs and functional transfers while maintaining precautions. Pt appearing only somewhat receptive to education this session. Pt reports he lives alone and only has intermittent assist at time of discharge, not open to [facility] therapy services at this time. Will continue to follow acutely to maximize his safety and independence with ADLs and mobility prior to discharge home.     Follow Up Recommendations  No OT follow up(would benefit from [facility], but pt declining)    Equipment Recommendations  None recommended by OT           Precautions / Restrictions Precautions Precautions: Back Precaution Booklet Issued: Yes (comment) Precaution Comments: issued and reviewed with pt Required Braces or Orthoses: Spinal Brace Spinal Brace: Lumbar corset Restrictions Weight Bearing Restrictions: No      Mobility Bed Mobility Overal bed mobility: Needs Assistance Bed Mobility: Rolling;Sidelying to Sit Rolling: Min guard Sidelying to sit: Min guard       General bed mobility comments: min cues to ensure safe technique with log roll with overall good carryover; minguard for  safety  Transfers Overall transfer level: Needs assistance Equipment used: Rolling walker (2 wheeled) Transfers: Sit to/from Stand Sit to Stand: Min assist;From elevated surface         General transfer comment: minA from lower bed height, though with increased effort noted; attempted again from slightly elevated EOB with light minA provided and decreased effort required. VCs for safe hand placement    Balance Overall balance assessment: Needs assistance Sitting-balance support: Feet supported Sitting balance-Leahy Scale: Good     Standing balance support: During functional activity;Bilateral upper extremity supported;Single extremity supported Standing balance-Leahy Scale: Fair Standing balance comment: able to static stand without UE support; reliant on UE support during dynamic mobility                           ADL either performed or assessed with clinical judgement   ADL Overall ADL's : Needs assistance/impaired Eating/Feeding: Modified independent;Sitting   Grooming: Min guard;Minimal assistance;Standing   Upper Body Bathing: Sitting;Min guard   Lower Body Bathing: Minimal assistance;Sit to/from stand   Upper Body Dressing : Minimal assistance;Min guard;Sitting Upper Body Dressing Details (indicate cue type and reason): VCs and setup for positioning brace properly; educated to don in sitting though pt opting to fully adjust in standing Lower Body Dressing: Minimal assistance;Sit to/from stand Lower Body Dressing Details (indicate cue type and reason): pt able to thread LEs into pants without assist while seated EOB and with good maintenance of back precautions; minA for sit<>stand  Toilet Transfer: Minimal assistance;Min guard;Ambulation;RW   Toileting- Water quality scientist and Hygiene: Min guard;Sit to/from stand       Functional mobility during ADLs: Min guard;Minimal assistance;Rolling walker General ADL  Comments: reviewed and educated on brace  management, safety and compensatory strategies for performing ADLs and functional transfers; attempted education and to inquire about receiving assist to care for his animals initially after discharge, though pt unsure if able to get additional help     Vision         Perception     Praxis      Pertinent Vitals/Pain Pain Assessment: Faces Faces Pain Scale: Hurts even more Pain Location: back at incision site Pain Descriptors / Indicators: Aching;Grimacing;Sore Pain Intervention(s): Limited activity within patient's tolerance;Monitored during session     Hand Dominance Right   Extremity/Trunk Assessment Upper Extremity Assessment Upper Extremity Assessment: Overall WFL for tasks assessed   Lower Extremity Assessment Lower Extremity Assessment: Defer to PT evaluation   Cervical / Trunk Assessment Cervical / Trunk Assessment: Other exceptions Cervical / Trunk Exceptions: s/p lumbar sx   Communication Communication Communication: No difficulties   Cognition Arousal/Alertness: Awake/alert Behavior During Therapy: WFL for tasks assessed/performed Overall Cognitive Status: Within Functional Limits for tasks assessed                                     General Comments       Exercises     Shoulder Instructions      Home Living Family/patient expects to be discharged to:: Private residence Living Arrangements: Spouse/significant other Available Help at Discharge: Friend(s);Available PRN/intermittently Type of Home: House Home Access: Stairs to enter [employer] of Steps: 2   Home Layout: One level     Bathroom Shower/Tub: Teacher, early years/pre: Standard     Home Equipment: Cane - single point;Shower seat;Walker - 4 wheels;Hospital bed;Wheelchair - manual;Bedside commode(BSC is currently at his mom's house)   Additional Comments: 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      Prior Functioning/Environment  Level of Independence: Independent with assistive device(s)        Comments: reports using both SPC and walker PTA, though also reports a friend borrowed his walker recently        OT Problem List: Decreased strength;Decreased range of motion;Decreased activity tolerance;Impaired balance (sitting and/or standing);Decreased knowledge of use of DME or AE;Pain;Decreased knowledge of precautions      OT Treatment/Interventions: Self-care/ADL training;Therapeutic exercise;Neuromuscular education;Therapeutic activities;Patient/family education;Balance training;DME and/or AE instruction    OT Goals(Current goals can be found in the care plan section) Acute Rehab OT Goals Patient Stated Goal: hopeful for no more surgeries OT Goal Formulation: With patient Time For Goal Achievement: 09/25/18 Potential to Achieve Goals: Good  OT Frequency: Min 2X/week   Barriers to D/C:            Co-evaluation              AM-PAC OT "6 Clicks" Daily Activity     Outcome Measure Help from another person eating meals?: None Help from another person taking care of personal grooming?: None Help from another person toileting, which includes using toliet, bedpan, or urinal?: A Little Help from another person bathing (including washing, rinsing, drying)?: A Little Help from another person to put on and taking off regular upper body clothing?: A Little Help from another person to put on and taking off regular lower body clothing?: A Little 6 Click Score: 20   End of Session Equipment Utilized During Treatment: Rolling walker;Back brace;Gait belt Nurse Communication: Mobility status  Activity Tolerance:  Patient tolerated treatment well Patient left: with nursing/sitter in room;with call bell/phone within reach;Other (comment)(sitting EOB)  OT Visit Diagnosis: Other abnormalities of gait and mobility (R26.89);Pain Pain - part of body: (back)                Time: 2919-1660 OT Time Calculation (min):  27 min Charges:  OT General Charges $OT Visit: 1 Visit OT Evaluation $OT Eval Low Complexity: 1 Low OT Treatments $Self Care/Home Management : 8-22 mins  Bradley Espinoza, OT Supplemental Rehabilitation Services Pager [phone] Office [phone]   Bradley Espinoza 09/11/2018, 9:31 AM

## 2018-09-11 NOTE — Evaluation (Signed)
Physical Therapy Evaluation and Discharge Patient Details Name: Bradley Espinoza MRN: [id number] DOB: 05-Sep-1956 Today's Date: 09/11/2018   History of Present Illness  Pt is a 62 y/o male now s/p T11-L4 nonsegmental adequate screw fixation with posterior lateral arthrodesis due to recent L2 burst fracture spinal deformity and back pain. PMHx includes multiple spinal surgeries, R ankle fusion (x2), depression, cervical surgery   Clinical Impression  Patient evaluated by Physical Therapy with no further acute PT needs identified. All education has been completed and the patient has no further questions. At the time of PT eval pt was able to demonstrate transfers and ambulation with gross supervision for safety and RW for support. Pt reports he has been through this all before and knows what he has to do. Although pt was able to recall 3/3 precautions, he was not able to maintain correctly throughout session and required cues. Attempted education regarding stair training, precautions, brace application/wearing schedule, and activity progression. Overall, education was difficult at times and pt not open to PT or any follow up. So, will sign off at this time. See below for any follow-up Physical Therapy or equipment needs. Thank you for this referral.     Follow Up Recommendations No PT follow up;Supervision - Intermittent    Equipment Recommendations  None recommended by PT (Pt declining RW even though he states "a friend is borrowing" his.)    Recommendations for Other Services       Precautions / Restrictions Precautions Precautions: Back Precaution Booklet Issued: Yes (comment) Precaution Comments: issued and reviewed with pt Required Braces or Orthoses: Spinal Brace Spinal Brace: Lumbar corset Restrictions Weight Bearing Restrictions: No      Mobility  Bed Mobility Overal bed mobility: Needs Assistance Bed Mobility: Rolling;Sidelying to Sit;Sit to Sidelying Rolling: Modified  independent (Device/Increase time) Sidelying to sit: Modified independent (Device/Increase time)     Sit to sidelying: Modified independent (Device/Increase time) General bed mobility comments: No assistance required. Pt was able to transition to/from EOB with HOB slightly elevated and use of rails for support. Pt has hospital bed at home and will be able to perform at a mod I level with these modifications.   Transfers Overall transfer level: Needs assistance Equipment used: Rolling walker (2 wheeled) Transfers: Sit to/from Stand Sit to Stand: Supervision         General transfer comment: Pt demonstrated proper hand placement on seated surface for safety. Was able to power-up to full stand with increased time and supervision for safety.  Ambulation/Gait Ambulation/Gait assistance: Supervision Gait Distance (Feet): 300 Feet Assistive device: Rolling walker (2 wheeled) Gait Pattern/deviations: Step-through pattern;Decreased stride length;Decreased dorsiflexion - left Gait velocity: Decreased Gait velocity interpretation: 1.31 - 2.62 ft/sec, indicative of limited community ambulator General Gait Details: VC's for improved posture. Feel RW could have been lowered but pt declined for PT to make adjustments. No unsteadiness or LOB with RW use.   Stairs Stairs: Yes Stairs assistance: Min guard Stair Management: Two rails;Step to pattern;Forwards Number of Stairs: 3 General stair comments: VC's for sequencing and general safety. Pt reports he only has a door to hold to when entering but was insistent on using B rails to practice even though he will not have them at home.   Wheelchair Mobility    Modified Rankin (Stroke Patients Only)       Balance Overall balance assessment: Needs assistance Sitting-balance support: Feet supported Sitting balance-Leahy Scale: Good     Standing balance support: During functional activity;Bilateral upper extremity  supported;Single extremity  supported Standing balance-Leahy Scale: Fair Standing balance comment: able to static stand without UE support; reliant on UE support during dynamic mobility                             Pertinent Vitals/Pain Pain Assessment: Faces Faces Pain Scale: Hurts little more Pain Location: back at incision site Pain Descriptors / Indicators: Aching;Grimacing;Sore Pain Intervention(s): Monitored during session;Repositioned;Patient requesting pain meds-RN notified    Home Living Family/patient expects to be discharged to:: Private residence Living Arrangements: Spouse/significant other Available Help at Discharge: Friend(s);Available PRN/intermittently Type of Home: House Home Access: Stairs to enter   Entrance Stairs-Number of Steps: 2 Home Layout: One level Home Equipment: Cane - single point;Shower seat;Walker - 4 wheels;Hospital bed;Wheelchair - manual;Bedside commode(BSC is currently at his mom's house) Additional Comments: 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    Prior Function Level of Independence: Independent with assistive device(s)         Comments: reports using both SPC and walker PTA, though also reports a friend borrowed his walker recently     Hand Dominance   Dominant Hand: Right    Extremity/Trunk Assessment   Upper Extremity Assessment Upper Extremity Assessment: Overall WFL for tasks assessed    Lower Extremity Assessment Lower Extremity Assessment: Generalized weakness(leg length discrepancy)    Cervical / Trunk Assessment Cervical / Trunk Assessment: Other exceptions Cervical / Trunk Exceptions: s/p lumbar sx  Communication   Communication: No difficulties  Cognition Arousal/Alertness: Awake/alert Behavior During Therapy: WFL for tasks assessed/performed Overall Cognitive Status: Within Functional Limits for tasks assessed                                        General Comments      Exercises      Assessment/Plan    PT Assessment Patent does not need any further PT services  PT Problem List         PT Treatment Interventions      PT Goals (Current goals can be found in the Care Plan section)  Acute Rehab PT Goals Patient Stated Goal: hopeful for no more surgeries PT Goal Formulation: All assessment and education complete, DC therapy    Frequency     Barriers to discharge        Co-evaluation               AM-PAC PT "6 Clicks" Mobility  Outcome Measure Help needed turning from your back to your side while in a flat bed without using bedrails?: None Help needed moving from lying on your back to sitting on the side of a flat bed without using bedrails?: None Help needed moving to and from a bed to a chair (including a wheelchair)?: None Help needed standing up from a chair using your arms (e.g., wheelchair or bedside chair)?: None Help needed to walk in hospital room?: None Help needed climbing 3-5 steps with a railing? : A Little 6 Click Score: 23    End of Session Equipment Utilized During Treatment: Back brace Activity Tolerance: Patient tolerated treatment well Patient left: in bed;with call bell/phone within reach Nurse Communication: Mobility status PT Visit Diagnosis: Pain;Other symptoms and signs involving the nervous system (R29.898) Pain - part of body: (back)    Time: 0814-4818 PT Time Calculation (min) (ACUTE ONLY):  33 min   Charges:   PT Evaluation $PT Eval Moderate Complexity: 1 Mod PT Treatments $Gait Training: 8-22 mins        Rolinda Roan, PT, DPT Acute Rehabilitation Services Pager: [phone] Office: [phone]   Thelma Comp 09/11/2018, 10:23 AM

## 2018-09-12 MED ORDER — OXYCODONE-ACETAMINOPHEN 10-325 MG PO TABS: 1.0000 | ORAL_TABLET | Freq: Four times a day (QID) | ORAL | 0 refills | Status: DC | PRN

## 2018-09-12 MED ORDER — METHOCARBAMOL 500 MG PO TABS: 500.0000 mg | ORAL_TABLET | Freq: Four times a day (QID) | ORAL | 0 refills | Status: DC | PRN

## 2018-09-12 NOTE — Progress Notes (Signed)
Patient is discharged from room 3C06 at this time. Alert and in stable condition. IV site d/c'd and instructions read to patient with understanding verbalized. Left unit via wheelchair with all belongings at side.  

## 2018-09-12 NOTE — Discharge Summary (Signed)
Physician Discharge Summary  Patient ID: Bradley Espinoza MRN: [id number] DOB/AGE: 1956-01-31 62 y.o. Estimated body mass index is 22.84 kg/m as calculated from the following:   Height as of this encounter: 6' (1.829 m).   Weight as of this encounter: 76.4 kg.   Admit date: 09/10/2018 Discharge date: 09/12/2018  Admission Diagnoses:instability and back pain  Discharge Diagnoses: same Active Problems:   Status post lumbar spinal fusion   Discharged Condition: good  Hospital Course: patient is been also underwent thoraco-lumbar stabilization from T11 down L4 postoperatively patient did very well recovered before the floor was angling and voiding spontaneously tolerating regular diet and pain was well controlled on pills. Patient will be discharged her scheduled follow-up in one to 2 weeks.  Consults: Significant Diagnostic Studies: Treatments:T11 L4 nonsegmental fixation with posterior lateral arthrodesis Discharge Exam: Blood pressure 129/76, pulse 82, temperature 98.1 F (36.7 C), temperature source Oral, resp. rate 18, height 6' (1.829 m), weight 76.4 kg, SpO2 95 %. Awake alert strength out of 5 wound clean dry and intact  Disposition: home  Discharge Instructions    Call MD for:  difficulty breathing, headache or visual disturbances   Complete by:  As directed    Call MD for:  hives   Complete by:  As directed    Call MD for:  persistant dizziness or light-headedness   Complete by:  As directed    Call MD for:  persistant nausea and vomiting   Complete by:  As directed    Call MD for:  redness, tenderness, or signs of infection (pain, swelling, redness, odor or green/yellow discharge around incision site)   Complete by:  As directed    Call MD for:  severe uncontrolled pain   Complete by:  As directed    Call MD for:  temperature >100.4   Complete by:  As directed    Diet - low sodium heart healthy   Complete by:  As directed    Driving Restrictions   Complete  by:  As directed    No driving for 2 weeks, no riding in the car for 1 week   Incentive spirometry RT   Complete by:  As directed    Increase activity slowly   Complete by:  As directed    Lifting restrictions   Complete by:  As directed    No lifting more than 8 lbs     Allergies as of 09/12/2018      Reactions   Codeine Nausea And Vomiting      Medication List    STOP taking these medications   ibuprofen 200 MG tablet Commonly known as:  ADVIL,MOTRIN     TAKE these medications   cyclobenzaprine 10 MG tablet Commonly known as:  FLEXERIL Take 1 tablet (10 mg total) by mouth 3 (three) times daily as needed for muscle spasms.   gabapentin 300 MG capsule Commonly known as:  NEURONTIN Take 300 mg by mouth See admin instructions. Take 300mg  by mouth in the morning and afternoon, take 600 mg at bedtime   meloxicam 15 MG tablet Commonly known as:  MOBIC Take 15 mg by mouth daily.   methocarbamol 500 MG tablet Commonly known as:  ROBAXIN Take 1 tablet (500 mg total) by mouth every 6 (six) hours as needed for muscle spasms.   oxyCODONE-acetaminophen 10-325 MG tablet Commonly known as:  PERCOCET Take 1 tablet by mouth every 4 (four) hours as needed for pain. What changed:  when to take this  oxyCODONE-acetaminophen 5-325 MG tablet Commonly known as:  PERCOCET/ROXICET Take 1 tablet by mouth every 4 (four) hours as needed for severe pain. What changed:    Another medication with the same name was added. Make sure you understand how and when to take each.  Another medication with the same name was changed. Make sure you understand how and when to take each.   oxyCODONE-acetaminophen 10-325 MG tablet Commonly known as:  PERCOCET Take 1 tablet by mouth every 6 (six) hours as needed for pain. What changed:  You were already taking a medication with the same name, and this prescription was added. Make sure you understand how and when to take each.   ranitidine 75 MG  tablet Commonly known as:  ZANTAC Take 150 mg by mouth daily as needed for heartburn.   tiZANidine 2 MG tablet Commonly known as:  ZANAFLEX Take 2-4 mg by mouth every 8 (eight) hours as needed for muscle spasms.        Signed: Maize Brittingham P 09/12/2018, 9:07 AM

## 2018-09-12 NOTE — Discharge Instructions (Signed)

## 2018-09-15 MED FILL — Heparin Sodium (Porcine) Inj 1000 Unit/ML: INTRAMUSCULAR | Qty: 30 | Status: AC

## 2018-09-15 MED FILL — Sodium Chloride IV Soln 0.9%: INTRAVENOUS | Qty: 1000 | Status: AC

## 2018-09-20 DIAGNOSIS — L0291 Cutaneous abscess, unspecified: Secondary | ICD-10-CM | POA: Diagnosis not present

## 2018-09-20 DIAGNOSIS — R030 Elevated blood-pressure reading, without diagnosis of hypertension: Secondary | ICD-10-CM | POA: Diagnosis not present

## 2018-09-23 DIAGNOSIS — E291 Testicular hypofunction: Secondary | ICD-10-CM | POA: Diagnosis not present

## 2018-10-06 ENCOUNTER — Ambulatory Visit (INDEPENDENT_AMBULATORY_CARE_PROVIDER_SITE_OTHER): Payer: PPO | Admitting: Orthopedic Surgery

## 2018-10-06 ENCOUNTER — Encounter (INDEPENDENT_AMBULATORY_CARE_PROVIDER_SITE_OTHER): Payer: Self-pay | Admitting: Orthopedic Surgery

## 2018-10-06 VITALS — Ht 72.0 in | Wt 168.4 lb

## 2018-10-06 DIAGNOSIS — M19172 Post-traumatic osteoarthritis, left ankle and foot: Secondary | ICD-10-CM

## 2018-10-06 DIAGNOSIS — M545 Low back pain: Secondary | ICD-10-CM | POA: Diagnosis not present

## 2018-10-06 DIAGNOSIS — Z981 Arthrodesis status: Secondary | ICD-10-CM

## 2018-10-06 DIAGNOSIS — M5412 Radiculopathy, cervical region: Secondary | ICD-10-CM | POA: Diagnosis not present

## 2018-10-06 DIAGNOSIS — M25571 Pain in right ankle and joints of right foot: Secondary | ICD-10-CM

## 2018-10-06 MED ORDER — METHYLPREDNISOLONE ACETATE 80 MG/ML IJ SUSP
80.0000 mg | INTRAMUSCULAR | Status: AC | PRN
  Administered 2018-10-06: 80 mg via INTRA_ARTICULAR

## 2018-10-06 MED ORDER — LIDOCAINE HCL 1 % IJ SOLN
1.0000 mL | INTRAMUSCULAR | Status: AC | PRN
  Administered 2018-10-06: 1 mL

## 2018-10-06 NOTE — Progress Notes (Signed)
Office Visit Note   Patient: Bradley Espinoza           Date of Birth: [birth year]           MRN: [id number] Visit Date: 10/06/2018              Requested by: Celene Squibb, MD [place], [place] [address] PCP: Celene Squibb, MD  Chief Complaint  Patient presents with  . Left Leg - Routine Post Op      HPI: Patient is a 62 year old gentleman who presents in follow-up for traumatic arthritis subtalar joint left foot and persistent pain in the right hindfoot status post tibial calcaneal fusion.  Patient states he is recently completed further fusion surgery of his lumbar spine with Dr. Saintclair Halsted.  Patient states that he supposed be wearing a back brace he is not wearing this today.  Patient states his right foot feels half asleep he states he is on Robaxin.  Patient states he has had good relief from the subtalar pain on the left foot after the last injection.  Assessment & Plan: Visit Diagnoses:  1. S/P ankle fusion   2. Pain in right ankle and joints of right foot   3. Post-traumatic osteoarthritis, left ankle and foot     Plan: Discussed with the patient some of the right-sided symptoms may be from back surgery.  We will reevaluate in 6 weeks to see what symptoms he is having with the right foot.  Left subtalar joint was injected he tolerated this well.  Patient states he needs to wait at least 8 weeks after his back surgery to consider any foot surgery.  Follow-Up Instructions: Return in about 6 weeks (around 11/17/2018).   Ortho Exam  Patient is alert, oriented, no adenopathy, well-dressed, normal affect, normal respiratory effort. Examination patient's feet are plantigrade bilaterally.  He has no tenderness to palpation over the left ankle he is point tender to palpation of the sinus Tarsi inversion and eversion reproduces the pain on the left foot.  Right foot he has generalized numbness.  He has pain which is relieved with manipulation of the hindfoot.  After  informed consent patient underwent a subtalar injection on the left.  Patient's feet are warm with no vascular insufficiency.  Imaging: No results found. No images are attached to the encounter.  Labs: Lab Results  Component Value Date   HGBA1C 5.1 06/26/2016   REPTSTATUS 04/10/2017 FINAL 04/05/2017   GRAMSTAIN  04/05/2017    FEW WBC PRESENT, PREDOMINANTLY PMN NO ORGANISMS SEEN    CULT No growth aerobically or anaerobically. 04/05/2017     Lab Results  Component Value Date   ALBUMIN 4.3 08/12/2015   ALBUMIN 4.1 05/06/2015   ALBUMIN 4.0 09/15/2014    Body mass index is 22.84 kg/m.  Orders:  No orders of the defined types were placed in this encounter.  No orders of the defined types were placed in this encounter.    Procedures: Small Joint Inj: L subtalar on 10/06/2018 12:29 PM Indications: pain and diagnostic evaluation Details: 22 G needle, dorsal approach  Spinal Needle: No  Medications: 1 mL lidocaine 1 %; 80 mg methylPREDNISolone acetate 80 MG/ML Outcome: tolerated well, no immediate complications Procedure, treatment alternatives, risks and benefits explained, specific risks discussed. Consent was given by the patient. Immediately prior to procedure a time out was called to verify the correct patient, procedure, equipment, support staff and site/side marked as required. Patient was prepped and  draped in the usual sterile fashion.      Clinical Data: No additional findings.  ROS:  All other systems negative, except as noted in the HPI. Review of Systems  Objective: Vital Signs: Ht 6' (1.829 m)   Wt 168 lb 6.9 oz (76.4 kg)   BMI 22.84 kg/m   Specialty Comments:  No specialty comments available.  PMFS History: Patient Active Problem List   Diagnosis Date Noted  . Status post lumbar spinal fusion 09/10/2018  . S/P ankle fusion 11/27/2017  . Nonunion of subtalar arthrodesis   . Avascular necrosis of talus, right ([place])   . Post-traumatic  osteoarthritis, left ankle and foot 10/22/2017  . DDD (degenerative disc disease), lumbar 04/05/2017  . Fracture of L2 vertebra ([place]) 01/12/2015  . Acute osteomyelitis of calcaneum ([place]) 10/09/2014  . Dysuria 10/05/2014  . Cellulitis 10/05/2014  . Fall from ladder 09/21/2014  . L2 vertebral fracture ([place]) 09/21/2014  . Bilateral calcaneal fractures 09/21/2014  . Chronic pain 09/21/2014  . Acute blood loss anemia 09/21/2014  . Open fracture of both calcanei 09/15/2014   Past Medical History:  Diagnosis Date  . Anxiety   . Arthritis    low back pain, lumbar radiculopathy  . Depression   . Fracture    B/L ankles  . GERD (gastroesophageal reflux disease)   . Headache(784.0)    allergy related   . History of kidney stones   . History of stomach ulcers   . Retained orthopedic hardware    failed retained hardware right foot  . Wears glasses     Family History  Problem Relation Age of Onset  . Diabetes Father   . Cancer Other     Past Surgical History:  Procedure Laterality Date  . ANKLE FUSION Right 05/11/2015   Procedure: Right Posterior Arthroscopic Subtalar Arthrodesis;  Surgeon: Newt Minion, MD;  Location: [facility];  Service: Orthopedics;  Laterality: Right;  . ANKLE FUSION Right 11/27/2017   Procedure: RIGHT TIBIOCALCANEAL FUSION;  Surgeon: Newt Minion, MD;  Location: [facility];  Service: Orthopedics;  Laterality: Right;  . ANTERIOR LAT LUMBAR FUSION N/A 04/05/2017   Procedure: Extreme Lateral Interbody Fusion - Lumbar three-lumbar four ,exploration of fusion Posterior augmentation with globus addition Removal hardware Lumbar one-three. Lumbar four-sacral one,  Removal internal bone growth stimulator;  Surgeon: Kary Kos, MD;  Location: [facility];  Service: Neurosurgery;  Laterality: N/A;  . BACK SURGERY  2004   x 2  . CERVICAL SPINE SURGERY  2008  . ESOPHAGOGASTRODUODENOSCOPY    . HARDWARE REMOVAL Right 10/09/2014   Procedure: Removal Deep Hardware, Irrigation and Debridement  Calcaneus, Place Antibiotic Beads and Wound VAC ;  Surgeon: Newt Minion, MD;  Location: [facility];  Service: Orthopedics;  Laterality: Right;  . HARDWARE REMOVAL Right 08/12/2015   Procedure: Removal Deep Hardware Right Foot;  Surgeon: Newt Minion, MD;  Location: [facility];  Service: Orthopedics;  Laterality: Right;  . I&D EXTREMITY Right 09/15/2014   Procedure: IRRIGATION AND DEBRIDEMENT Ankle;  Surgeon: Renette Butters, MD;  Location: [facility];  Service: Orthopedics;  Laterality: Right;  . INGUINAL HERNIA REPAIR Bilateral   . LAMINECTOMY WITH POSTERIOR LATERAL ARTHRODESIS LEVEL 4 N/A 09/10/2018   Procedure: Posterior Lateral Fusion - Thoracic Eleven-Thoracic Twelve - Thoracic Twelve-Lumbar One - Lumbar One-Lumbar Two - Lumbar Two-Lumbar Three with instrumentaion and PLA;  Surgeon: Kary Kos, MD;  Location: [facility];  Service: Neurosurgery;  Laterality: N/A;  Posterior Lateral Fusion - Thoracic Eleven-Thoracic  Twelve - Thoracic Twelve-Lumbar One - Lumbar One-Lumbar Two - Lumbar Two-Lumbar Thre  . LUMBAR PERCUTANEOUS PEDICLE SCREW 1 LEVEL N/A 04/05/2017   Procedure: LUMBAR PERCUTANEOUS PEDICLE SCREW LUMBAR THREE-FOUR;  Surgeon: Kary Kos, MD;  Location: [facility];  Service: Neurosurgery;  Laterality: N/A;  . ORIF CALCANEOUS FRACTURE Right 09/19/2014   Procedure: OPEN REDUCTION INTERNAL FIXATION (ORIF) CALCANEOUS FRACTURE;  Surgeon: Newt Minion, MD;  Location: [facility];  Service: Orthopedics;  Laterality: Right;  . ORIF CALCANEOUS FRACTURE Left 09/19/2014   Procedure: OPEN REDUCTION INTERNAL FIXATION (ORIF) CALCANEOUS FRACTURE;  Surgeon: Newt Minion, MD;  Location: [facility];  Service: Orthopedics;  Laterality: Left;   Social History   Occupational History    Comment: disabled  Tobacco Use  . Smoking status: Current Some Day Smoker    Packs/day: 1.00    Years: 10.00    Pack years: 10.00    Types: Cigars    Start date: 10/08/1976    Last attempt to quit: 09/07/2014    Years since quitting: 4.0  . Smokeless  tobacco: Never Used  . Tobacco comment: 06/26/16 reports smokes a cigar 2 times a week at the most  Substance and Sexual Activity  . Alcohol use: No    Comment: 06/26/16 quit 1998  . Drug use: No  . Sexual activity: Yes    Birth control/protection: None

## 2018-10-10 DIAGNOSIS — E291 Testicular hypofunction: Secondary | ICD-10-CM | POA: Diagnosis not present

## 2018-10-23 DIAGNOSIS — S32001A Stable burst fracture of unspecified lumbar vertebra, initial encounter for closed fracture: Secondary | ICD-10-CM | POA: Diagnosis not present

## 2018-10-31 DIAGNOSIS — Z0000 Encounter for general adult medical examination without abnormal findings: Secondary | ICD-10-CM | POA: Diagnosis not present

## 2018-10-31 DIAGNOSIS — R5383 Other fatigue: Secondary | ICD-10-CM | POA: Diagnosis not present

## 2018-10-31 DIAGNOSIS — J029 Acute pharyngitis, unspecified: Secondary | ICD-10-CM | POA: Diagnosis not present

## 2018-10-31 DIAGNOSIS — M5136 Other intervertebral disc degeneration, lumbar region: Secondary | ICD-10-CM | POA: Diagnosis not present

## 2018-10-31 DIAGNOSIS — N400 Enlarged prostate without lower urinary tract symptoms: Secondary | ICD-10-CM | POA: Diagnosis not present

## 2018-10-31 DIAGNOSIS — E291 Testicular hypofunction: Secondary | ICD-10-CM | POA: Diagnosis not present

## 2018-10-31 DIAGNOSIS — J039 Acute tonsillitis, unspecified: Secondary | ICD-10-CM | POA: Diagnosis not present

## 2018-10-31 DIAGNOSIS — Z6823 Body mass index (BMI) 23.0-23.9, adult: Secondary | ICD-10-CM | POA: Diagnosis not present

## 2018-10-31 DIAGNOSIS — Z23 Encounter for immunization: Secondary | ICD-10-CM | POA: Diagnosis not present

## 2018-10-31 DIAGNOSIS — E782 Mixed hyperlipidemia: Secondary | ICD-10-CM | POA: Diagnosis not present

## 2018-10-31 DIAGNOSIS — L0291 Cutaneous abscess, unspecified: Secondary | ICD-10-CM | POA: Diagnosis not present

## 2018-10-31 DIAGNOSIS — R1011 Right upper quadrant pain: Secondary | ICD-10-CM | POA: Diagnosis not present

## 2018-11-04 ENCOUNTER — Other Ambulatory Visit: Payer: Self-pay | Admitting: Adult Health Nurse Practitioner

## 2018-11-04 DIAGNOSIS — R1011 Right upper quadrant pain: Secondary | ICD-10-CM

## 2018-11-07 ENCOUNTER — Ambulatory Visit (HOSPITAL_COMMUNITY)
Admission: RE | Admit: 2018-11-07 | Discharge: 2018-11-07 | Disposition: A | Discharge status: Home / Self Care | Payer: PPO | Source: Ambulatory Visit | Attending: Adult Health Nurse Practitioner | Admitting: Adult Health Nurse Practitioner

## 2018-11-07 DIAGNOSIS — R1011 Right upper quadrant pain: Secondary | ICD-10-CM | POA: Diagnosis not present

## 2018-11-07 DIAGNOSIS — K8020 Calculus of gallbladder without cholecystitis without obstruction: Secondary | ICD-10-CM | POA: Diagnosis not present

## 2018-11-11 DIAGNOSIS — M5412 Radiculopathy, cervical region: Secondary | ICD-10-CM | POA: Diagnosis not present

## 2018-11-18 ENCOUNTER — Encounter (INDEPENDENT_AMBULATORY_CARE_PROVIDER_SITE_OTHER): Payer: Self-pay | Admitting: Orthopedic Surgery

## 2018-11-18 ENCOUNTER — Other Ambulatory Visit (HOSPITAL_COMMUNITY): Payer: Self-pay | Admitting: Adult Health Nurse Practitioner

## 2018-11-18 ENCOUNTER — Ambulatory Visit (INDEPENDENT_AMBULATORY_CARE_PROVIDER_SITE_OTHER): Payer: PPO | Admitting: Orthopedic Surgery

## 2018-11-18 ENCOUNTER — Ambulatory Visit (INDEPENDENT_AMBULATORY_CARE_PROVIDER_SITE_OTHER): Payer: PPO

## 2018-11-18 DIAGNOSIS — M19171 Post-traumatic osteoarthritis, right ankle and foot: Secondary | ICD-10-CM | POA: Diagnosis not present

## 2018-11-18 DIAGNOSIS — M25571 Pain in right ankle and joints of right foot: Secondary | ICD-10-CM

## 2018-11-18 DIAGNOSIS — K8010 Calculus of gallbladder with chronic cholecystitis without obstruction: Secondary | ICD-10-CM

## 2018-11-20 ENCOUNTER — Encounter (INDEPENDENT_AMBULATORY_CARE_PROVIDER_SITE_OTHER): Payer: Self-pay | Admitting: Orthopedic Surgery

## 2018-11-20 ENCOUNTER — Telehealth (INDEPENDENT_AMBULATORY_CARE_PROVIDER_SITE_OTHER): Payer: Self-pay

## 2018-11-20 NOTE — Telephone Encounter (Signed)
Can you please dictate the 11/18/18 office note so I can send to [employer] for surgery precert

## 2018-11-20 NOTE — Telephone Encounter (Signed)
dictated

## 2018-11-20 NOTE — Progress Notes (Signed)
Office Visit Note   Patient: Bradley Espinoza           Date of Birth: 21-Jan-1956           MRN: [id number] Visit Date: 11/18/2018              Requested by: Celene Squibb, MD [place], [place] [address] PCP: Celene Squibb, MD  Chief Complaint  Patient presents with  . Left Foot - Pain  . Right Foot - Pain      HPI: Patient is a 63 year old gentleman status post bilateral calcaneal fractures who subsequently underwent a tibial calcaneal fusion on the right.  Patient states he has throbbing burning pain with attempted weightbearing.  Patient states the has been having symptoms with his gallbladder and has a follow-up with general surgery for possible cholecystectomy.  Patient states he recently has stopped smoking cigarettes but states that he is smoking cigars.  Again reinforced the importance of smoking cessation for bone and skin healing.  Assessment & Plan: Visit Diagnoses:  1. Pain in right ankle and joints of right foot   2. Post-traumatic osteoarthritis, right ankle and foot     Plan: Due to the persistent pain in the fibrous nonunion patient states he would like to proceed with revision surgical intervention.  We will plan for removal of the tibial calcaneal fusion now plan for anterior incision with open fusion of the tibial talar joint.  Risks and benefits were discussed including risk of the wound not healing risk of the bone nonhealing need for additional surgery discussed the increased risk with persistent tobacco use.  Patient states he understands wishes to proceed at this time.  Follow-Up Instructions: Return in about 1 week (around 11/25/2018).   Ortho Exam  Patient is alert, oriented, no adenopathy, well-dressed, normal affect, normal respiratory effort. Examination patient has good capillary refill in his feet he has an antalgic gait with a short stance phase on the right.  He has good hair growth on the foot.  There is no redness no  cellulitis no signs of infection.  Imaging: No results found. No images are attached to the encounter.  Labs: Lab Results  Component Value Date   HGBA1C 5.1 06/26/2016   REPTSTATUS 04/10/2017 FINAL 04/05/2017   GRAMSTAIN  04/05/2017    FEW WBC PRESENT, PREDOMINANTLY PMN NO ORGANISMS SEEN    CULT No growth aerobically or anaerobically. 04/05/2017     Lab Results  Component Value Date   ALBUMIN 4.3 08/12/2015   ALBUMIN 4.1 05/06/2015   ALBUMIN 4.0 09/15/2014    There is no height or weight on file to calculate BMI.  Orders:  Orders Placed This Encounter  Procedures  . XR Ankle Complete Right   No orders of the defined types were placed in this encounter.    Procedures: No procedures performed  Clinical Data: No additional findings.  ROS:  All other systems negative, except as noted in the HPI. Review of Systems  Objective: Vital Signs: There were no vitals taken for this visit.  Specialty Comments:  No specialty comments available.  PMFS History: Patient Active Problem List   Diagnosis Date Noted  . Status post lumbar spinal fusion 09/10/2018  . S/P ankle fusion 11/27/2017  . Nonunion of subtalar arthrodesis   . Avascular necrosis of talus, right ([place])   . Post-traumatic osteoarthritis, left ankle and foot 10/22/2017  . DDD (degenerative disc disease), lumbar 04/05/2017  . Fracture of L2  vertebra ([place]) 01/12/2015  . Acute osteomyelitis of calcaneum ([place]) 10/09/2014  . Dysuria 10/05/2014  . Cellulitis 10/05/2014  . Fall from ladder 09/21/2014  . L2 vertebral fracture ([place]) 09/21/2014  . Bilateral calcaneal fractures 09/21/2014  . Chronic pain 09/21/2014  . Acute blood loss anemia 09/21/2014  . Open fracture of both calcanei 09/15/2014   Past Medical History:  Diagnosis Date  . Anxiety   . Arthritis    low back pain, lumbar radiculopathy  . Depression   . Fracture    B/L ankles  . GERD (gastroesophageal reflux disease)   . Headache(784.0)     allergy related   . History of kidney stones   . History of stomach ulcers   . Retained orthopedic hardware    failed retained hardware right foot  . Wears glasses     Family History  Problem Relation Age of Onset  . Diabetes Father   . Cancer Other     Past Surgical History:  Procedure Laterality Date  . ANKLE FUSION Right 05/11/2015   Procedure: Right Posterior Arthroscopic Subtalar Arthrodesis;  Surgeon: Newt Minion, MD;  Location: [facility];  Service: Orthopedics;  Laterality: Right;  . ANKLE FUSION Right 11/27/2017   Procedure: RIGHT TIBIOCALCANEAL FUSION;  Surgeon: Newt Minion, MD;  Location: [facility];  Service: Orthopedics;  Laterality: Right;  . ANTERIOR LAT LUMBAR FUSION N/A 04/05/2017   Procedure: Extreme Lateral Interbody Fusion - Lumbar three-lumbar four ,exploration of fusion Posterior augmentation with globus addition Removal hardware Lumbar one-three. Lumbar four-sacral one,  Removal internal bone growth stimulator;  Surgeon: Kary Kos, MD;  Location: [facility];  Service: Neurosurgery;  Laterality: N/A;  . BACK SURGERY  2004   x 2  . CERVICAL SPINE SURGERY  2008  . ESOPHAGOGASTRODUODENOSCOPY    . HARDWARE REMOVAL Right 10/09/2014   Procedure: Removal Deep Hardware, Irrigation and Debridement Calcaneus, Place Antibiotic Beads and Wound VAC ;  Surgeon: Newt Minion, MD;  Location: [facility];  Service: Orthopedics;  Laterality: Right;  . HARDWARE REMOVAL Right 08/12/2015   Procedure: Removal Deep Hardware Right Foot;  Surgeon: Newt Minion, MD;  Location: [facility];  Service: Orthopedics;  Laterality: Right;  . I&D EXTREMITY Right 09/15/2014   Procedure: IRRIGATION AND DEBRIDEMENT Ankle;  Surgeon: Renette Butters, MD;  Location: [facility];  Service: Orthopedics;  Laterality: Right;  . INGUINAL HERNIA REPAIR Bilateral   . LAMINECTOMY WITH POSTERIOR LATERAL ARTHRODESIS LEVEL 4 N/A 09/10/2018   Procedure: Posterior Lateral Fusion - Thoracic Eleven-Thoracic Twelve - Thoracic Twelve-Lumbar One -  Lumbar One-Lumbar Two - Lumbar Two-Lumbar Three with instrumentaion and PLA;  Surgeon: Kary Kos, MD;  Location: [facility];  Service: Neurosurgery;  Laterality: N/A;  Posterior Lateral Fusion - Thoracic Eleven-Thoracic Twelve - Thoracic Twelve-Lumbar One - Lumbar One-Lumbar Two - Lumbar Two-Lumbar Thre  . LUMBAR PERCUTANEOUS PEDICLE SCREW 1 LEVEL N/A 04/05/2017   Procedure: LUMBAR PERCUTANEOUS PEDICLE SCREW LUMBAR THREE-FOUR;  Surgeon: Kary Kos, MD;  Location: [facility];  Service: Neurosurgery;  Laterality: N/A;  . ORIF CALCANEOUS FRACTURE Right 09/19/2014   Procedure: OPEN REDUCTION INTERNAL FIXATION (ORIF) CALCANEOUS FRACTURE;  Surgeon: Newt Minion, MD;  Location: [facility];  Service: Orthopedics;  Laterality: Right;  . ORIF CALCANEOUS FRACTURE Left 09/19/2014   Procedure: OPEN REDUCTION INTERNAL FIXATION (ORIF) CALCANEOUS FRACTURE;  Surgeon: Newt Minion, MD;  Location: [facility];  Service: Orthopedics;  Laterality: Left;   Social History   Occupational History    Comment: disabled  Tobacco Use  . Smoking status: Current Some Day Smoker    Packs/day: 1.00    Years: 10.00    Pack years: 10.00    Types: Cigars    Start date: 10/08/1976    Last attempt to quit: 09/07/2014    Years since quitting: 4.2  . Smokeless tobacco: Never Used  . Tobacco comment: 06/26/16 reports smokes a cigar 2 times a week at the most  Substance and Sexual Activity  . Alcohol use: No    Comment: 06/26/16 quit 1998  . Drug use: No  . Sexual activity: Yes    Birth control/protection: None

## 2018-11-21 ENCOUNTER — Ambulatory Visit (INDEPENDENT_AMBULATORY_CARE_PROVIDER_SITE_OTHER): Payer: Self-pay | Admitting: Physician Assistant

## 2018-11-21 ENCOUNTER — Encounter (HOSPITAL_COMMUNITY): Payer: Self-pay

## 2018-11-21 ENCOUNTER — Ambulatory Visit (HOSPITAL_COMMUNITY)
Admission: RE | Admit: 2018-11-21 | Discharge: 2018-11-21 | Disposition: A | Discharge status: Home / Self Care | Payer: PPO | Source: Ambulatory Visit | Attending: Adult Health Nurse Practitioner | Admitting: Adult Health Nurse Practitioner

## 2018-11-21 DIAGNOSIS — K8010 Calculus of gallbladder with chronic cholecystitis without obstruction: Secondary | ICD-10-CM | POA: Diagnosis not present

## 2018-11-21 DIAGNOSIS — R1010 Upper abdominal pain, unspecified: Secondary | ICD-10-CM | POA: Diagnosis not present

## 2018-11-21 MED ORDER — TECHNETIUM TC 99M MEBROFENIN IV KIT
5.0000 | PACK | Freq: Once | INTRAVENOUS | Status: AC | PRN
  Administered 2018-11-21: 5.23 via INTRAVENOUS

## 2018-11-24 ENCOUNTER — Encounter (HOSPITAL_COMMUNITY): Payer: Self-pay

## 2018-11-25 NOTE — Anesthesia Preprocedure Evaluation (Signed)
Anesthesia Evaluation  Patient identified by MRN, date of birth, ID band Patient awake    Reviewed: Allergy & Precautions, NPO status , Patient's Chart, lab work & pertinent test results  History of Anesthesia Complications Negative for: history of anesthetic complications  Airway Mallampati: II  TM Distance: >3 FB Neck ROM: Full    Dental  (+) Partial Upper, Partial Lower, Dental Advisory Given, Missing   Pulmonary Current Smoker,    breath sounds clear to auscultation       Cardiovascular (-) anginanegative cardio ROS   Rhythm:Regular Rate:Normal     Neuro/Psych  Headaches, PSYCHIATRIC DISORDERS Anxiety Depression Back pain: narcotics    GI/Hepatic Neg liver ROS, GERD  Medicated and Controlled,  Endo/Other  negative endocrine ROS  Renal/GU negative Renal ROS     Musculoskeletal  (+) Arthritis , Osteoarthritis,    Abdominal   Peds  Hematology negative hematology ROS (+)   Anesthesia Other Findings   Reproductive/Obstetrics                             Anesthesia Physical  Anesthesia Plan  ASA: III  Anesthesia Plan: General   Post-op Pain Management: GA combined w/ Regional for post-op pain   Induction: Intravenous  PONV Risk Score and Plan: 2 and Ondansetron and Dexamethasone  Airway Management Planned: Oral ETT and LMA  Additional Equipment:   Intra-op Plan:   Post-operative Plan: Extubation in OR  Informed Consent: I have reviewed the patients History and Physical, chart, labs and discussed the procedure including the risks, benefits and alternatives for the proposed anesthesia with the patient or authorized representative who has indicated his/her understanding and acceptance.     Dental advisory given  Plan Discussed with: CRNA, Surgeon and Anesthesiologist  Anesthesia Plan Comments:         Anesthesia Quick Evaluation

## 2018-11-26 ENCOUNTER — Inpatient Hospital Stay (HOSPITAL_COMMUNITY)
Admission: RE | Admit: 2018-11-26 | Discharge: 2018-11-28 | DRG: 494 | Disposition: A | Discharge status: Home / Self Care | Payer: PPO | Attending: Orthopedic Surgery | Admitting: Orthopedic Surgery

## 2018-11-26 ENCOUNTER — Encounter (HOSPITAL_COMMUNITY)
Admission: RE | Disposition: A | Discharge status: Home / Self Care | Payer: Self-pay | Source: Home / Self Care | Attending: Orthopedic Surgery

## 2018-11-26 ENCOUNTER — Encounter (HOSPITAL_COMMUNITY): Payer: Self-pay

## 2018-11-26 ENCOUNTER — Other Ambulatory Visit: Payer: Self-pay

## 2018-11-26 ENCOUNTER — Inpatient Hospital Stay (HOSPITAL_COMMUNITY): Payer: PPO | Admitting: Anesthesiology

## 2018-11-26 DIAGNOSIS — Z791 Long term (current) use of non-steroidal anti-inflammatories (NSAID): Secondary | ICD-10-CM

## 2018-11-26 DIAGNOSIS — Z885 Allergy status to narcotic agent status: Secondary | ICD-10-CM | POA: Diagnosis not present

## 2018-11-26 DIAGNOSIS — T8149XA Infection following a procedure, other surgical site, initial encounter: Secondary | ICD-10-CM | POA: Diagnosis not present

## 2018-11-26 DIAGNOSIS — K219 Gastro-esophageal reflux disease without esophagitis: Secondary | ICD-10-CM | POA: Diagnosis present

## 2018-11-26 DIAGNOSIS — Z833 Family history of diabetes mellitus: Secondary | ICD-10-CM | POA: Diagnosis not present

## 2018-11-26 DIAGNOSIS — Y838 Other surgical procedures as the cause of abnormal reaction of the patient, or of later complication, without mention of misadventure at the time of the procedure: Secondary | ICD-10-CM | POA: Diagnosis present

## 2018-11-26 DIAGNOSIS — G8918 Other acute postprocedural pain: Secondary | ICD-10-CM | POA: Diagnosis not present

## 2018-11-26 DIAGNOSIS — T84498A Other mechanical complication of other internal orthopedic devices, implants and grafts, initial encounter: Secondary | ICD-10-CM | POA: Diagnosis present

## 2018-11-26 DIAGNOSIS — M960 Pseudarthrosis after fusion or arthrodesis: Principal | ICD-10-CM | POA: Diagnosis present

## 2018-11-26 DIAGNOSIS — T84498S Other mechanical complication of other internal orthopedic devices, implants and grafts, sequela: Secondary | ICD-10-CM

## 2018-11-26 DIAGNOSIS — F17290 Nicotine dependence, other tobacco product, uncomplicated: Secondary | ICD-10-CM | POA: Diagnosis present

## 2018-11-26 DIAGNOSIS — M25571 Pain in right ankle and joints of right foot: Secondary | ICD-10-CM | POA: Diagnosis present

## 2018-11-26 HISTORY — PX: HARDWARE REMOVAL: SHX979

## 2018-11-26 HISTORY — PX: ANKLE FUSION: SHX5718

## 2018-11-26 LAB — BASIC METABOLIC PANEL
Anion gap: 10 (ref 5–15)
BUN: 9 mg/dL (ref 8–23)
CO2: 24 mmol/L (ref 22–32)
CREATININE: 0.72 mg/dL (ref 0.61–1.24)
Calcium: 8.7 mg/dL — ABNORMAL LOW (ref 8.9–10.3)
Chloride: 107 mmol/L (ref 98–111)
GFR calc Af Amer: >60 mL/min (ref >60)
GFR calc non Af Amer: >60 mL/min (ref >60)
Glucose, Bld: 93 mg/dL (ref 70–99)
Potassium: 3.8 mmol/L (ref 3.5–5.1)
Sodium: 141 mmol/L (ref 135–145)

## 2018-11-26 LAB — CBC
HCT: 44.8 % (ref 39.0–52.0)
Hemoglobin: 14.1 g/dL (ref 13.0–17.0)
MCH: 30.2 pg (ref 26.0–34.0)
MCHC: 31.5 g/dL (ref 30.0–36.0)
MCV: 95.9 fL (ref 80.0–100.0)
NRBC: 0 % (ref 0.0–0.2)
Platelets: 198 10*3/uL (ref 150–400)
RBC: 4.67 MIL/uL (ref 4.22–5.81)
RDW: 12.6 % (ref 11.5–15.5)
WBC: 5.3 10*3/uL (ref 4.0–10.5)

## 2018-11-26 LAB — SURGICAL PCR SCREEN
MRSA, PCR: NEGATIVE
STAPHYLOCOCCUS AUREUS: POSITIVE — AB

## 2018-11-26 SURGERY — REMOVAL, HARDWARE
Anesthesia: General | Laterality: Right

## 2018-11-26 MED ORDER — DOCUSATE SODIUM 100 MG PO CAPS
100.0000 mg | ORAL_CAPSULE | Freq: Two times a day (BID) | ORAL | Status: DC
  Administered 2018-11-27 – 2018-11-28 (×3): 100 mg via ORAL
  Filled 2018-11-26 (×3): qty 1

## 2018-11-26 MED ORDER — METHOCARBAMOL 1000 MG/10ML IJ SOLN
500.0000 mg | Freq: Four times a day (QID) | INTRAVENOUS | Status: DC | PRN
  Filled 2018-11-26: qty 5

## 2018-11-26 MED ORDER — MAGNESIUM CITRATE PO SOLN: 1.0000 | Freq: Once | ORAL | Status: DC | PRN

## 2018-11-26 MED ORDER — PROPOFOL 10 MG/ML IV BOLUS
INTRAVENOUS | Status: AC
  Filled 2018-11-26: qty 20

## 2018-11-26 MED ORDER — ONDANSETRON HCL 4 MG PO TABS: 4.0000 mg | ORAL_TABLET | Freq: Four times a day (QID) | ORAL | Status: DC | PRN

## 2018-11-26 MED ORDER — ACETAMINOPHEN 325 MG PO TABS: 325.0000 mg | ORAL_TABLET | Freq: Four times a day (QID) | ORAL | Status: DC | PRN

## 2018-11-26 MED ORDER — CHLORHEXIDINE GLUCONATE 4 % EX LIQD: 60.0000 mL | Freq: Once | CUTANEOUS | Status: DC

## 2018-11-26 MED ORDER — LIDOCAINE 2% (20 MG/ML) 5 ML SYRINGE
INTRAMUSCULAR | Status: DC | PRN
  Administered 2018-11-26: 100 mg via INTRAVENOUS

## 2018-11-26 MED ORDER — OXYCODONE HCL 5 MG PO TABS
10.0000 mg | ORAL_TABLET | ORAL | Status: DC | PRN
  Administered 2018-11-27 – 2018-11-28 (×5): 15 mg via ORAL
  Administered 2018-11-28: 10 mg via ORAL
  Administered 2018-11-28: 15 mg via ORAL
  Filled 2018-11-26 (×7): qty 3

## 2018-11-26 MED ORDER — ACETAMINOPHEN 325 MG PO TABS: 325.0000 mg | ORAL_TABLET | ORAL | Status: DC | PRN

## 2018-11-26 MED ORDER — SODIUM CHLORIDE 0.9 % IV SOLN
INTRAVENOUS | Status: DC
  Administered 2018-11-26: 16:00:00 via INTRAVENOUS

## 2018-11-26 MED ORDER — KETAMINE HCL 50 MG/5ML IJ SOSY
PREFILLED_SYRINGE | INTRAMUSCULAR | Status: AC
  Filled 2018-11-26: qty 5

## 2018-11-26 MED ORDER — ONDANSETRON HCL 4 MG/2ML IJ SOLN: 4.0000 mg | Freq: Once | INTRAMUSCULAR | Status: DC | PRN

## 2018-11-26 MED ORDER — DIPHENHYDRAMINE HCL 12.5 MG/5ML PO ELIX: 12.5000 mg | ORAL_SOLUTION | ORAL | Status: DC | PRN

## 2018-11-26 MED ORDER — HYDROMORPHONE HCL 1 MG/ML IJ SOLN
0.5000 mg | INTRAMUSCULAR | Status: DC | PRN
  Administered 2018-11-26 – 2018-11-28 (×6): 1 mg via INTRAVENOUS
  Filled 2018-11-26 (×6): qty 1

## 2018-11-26 MED ORDER — FENTANYL CITRATE (PF) 250 MCG/5ML IJ SOLN
INTRAMUSCULAR | Status: DC | PRN
  Administered 2018-11-26: 50 ug via INTRAVENOUS

## 2018-11-26 MED ORDER — POLYETHYLENE GLYCOL 3350 17 G PO PACK: 17.0000 g | PACK | Freq: Every day | ORAL | Status: DC | PRN

## 2018-11-26 MED ORDER — FENTANYL CITRATE (PF) 250 MCG/5ML IJ SOLN
INTRAMUSCULAR | Status: AC
  Filled 2018-11-26: qty 5

## 2018-11-26 MED ORDER — METOCLOPRAMIDE HCL 5 MG/ML IJ SOLN: 5.0000 mg | Freq: Three times a day (TID) | INTRAMUSCULAR | Status: DC | PRN

## 2018-11-26 MED ORDER — TAMSULOSIN HCL 0.4 MG PO CAPS
0.4000 mg | ORAL_CAPSULE | Freq: Every day | ORAL | Status: DC
  Administered 2018-11-26 – 2018-11-28 (×3): 0.4 mg via ORAL
  Filled 2018-11-26 (×3): qty 1

## 2018-11-26 MED ORDER — ONDANSETRON HCL 4 MG/2ML IJ SOLN: 4.0000 mg | Freq: Four times a day (QID) | INTRAMUSCULAR | Status: DC | PRN

## 2018-11-26 MED ORDER — FENTANYL CITRATE (PF) 100 MCG/2ML IJ SOLN
INTRAMUSCULAR | Status: AC
  Filled 2018-11-26: qty 2

## 2018-11-26 MED ORDER — MEPERIDINE HCL 50 MG/ML IJ SOLN: 6.2500 mg | INTRAMUSCULAR | Status: DC | PRN

## 2018-11-26 MED ORDER — ONDANSETRON HCL 4 MG/2ML IJ SOLN
INTRAMUSCULAR | Status: DC | PRN
  Administered 2018-11-26: 4 mg via INTRAVENOUS

## 2018-11-26 MED ORDER — FENTANYL CITRATE (PF) 100 MCG/2ML IJ SOLN
100.0000 ug | Freq: Once | INTRAMUSCULAR | Status: AC
  Administered 2018-11-26: 100 ug via INTRAVENOUS

## 2018-11-26 MED ORDER — FENTANYL CITRATE (PF) 100 MCG/2ML IJ SOLN
INTRAMUSCULAR | Status: AC
  Administered 2018-11-26: 100 ug via INTRAVENOUS
  Filled 2018-11-26: qty 2

## 2018-11-26 MED ORDER — ASPIRIN EC 325 MG PO TBEC
325.0000 mg | DELAYED_RELEASE_TABLET | Freq: Every day | ORAL | Status: DC
  Administered 2018-11-26 – 2018-11-28 (×3): 325 mg via ORAL
  Filled 2018-11-26 (×3): qty 1

## 2018-11-26 MED ORDER — 0.9 % SODIUM CHLORIDE (POUR BTL) OPTIME
TOPICAL | Status: DC | PRN
  Administered 2018-11-26: 1000 mL

## 2018-11-26 MED ORDER — VANCOMYCIN HCL 500 MG IV SOLR
INTRAVENOUS | Status: AC
  Filled 2018-11-26: qty 500

## 2018-11-26 MED ORDER — OXYCODONE HCL 5 MG PO TABS: 5.0000 mg | ORAL_TABLET | ORAL | Status: DC | PRN

## 2018-11-26 MED ORDER — FENTANYL CITRATE (PF) 100 MCG/2ML IJ SOLN
25.0000 ug | INTRAMUSCULAR | Status: DC | PRN
  Administered 2018-11-26 (×2): 25 ug via INTRAVENOUS

## 2018-11-26 MED ORDER — METOCLOPRAMIDE HCL 5 MG PO TABS: 5.0000 mg | ORAL_TABLET | Freq: Three times a day (TID) | ORAL | Status: DC | PRN

## 2018-11-26 MED ORDER — MIDAZOLAM HCL 2 MG/2ML IJ SOLN
INTRAMUSCULAR | Status: AC
  Administered 2018-11-26: 2 mg via INTRAVENOUS
  Filled 2018-11-26: qty 2

## 2018-11-26 MED ORDER — MIDAZOLAM HCL 2 MG/2ML IJ SOLN
INTRAMUSCULAR | Status: AC
  Filled 2018-11-26: qty 2

## 2018-11-26 MED ORDER — CLONIDINE HCL (ANALGESIA) 100 MCG/ML EP SOLN
EPIDURAL | Status: DC | PRN
  Administered 2018-11-26 (×2): 50 ug

## 2018-11-26 MED ORDER — BISACODYL 10 MG RE SUPP: 10.0000 mg | Freq: Every day | RECTAL | Status: DC | PRN

## 2018-11-26 MED ORDER — GENTAMICIN SULFATE 40 MG/ML IJ SOLN
INTRAMUSCULAR | Status: AC
  Filled 2018-11-26: qty 4

## 2018-11-26 MED ORDER — GABAPENTIN 300 MG PO CAPS
300.0000 mg | ORAL_CAPSULE | Freq: Three times a day (TID) | ORAL | Status: DC
  Administered 2018-11-26 – 2018-11-28 (×7): 300 mg via ORAL
  Filled 2018-11-26 (×7): qty 1

## 2018-11-26 MED ORDER — CEFAZOLIN SODIUM-DEXTROSE 1-4 GM/50ML-% IV SOLN
1.0000 g | Freq: Four times a day (QID) | INTRAVENOUS | Status: AC
  Administered 2018-11-26 – 2018-11-27 (×3): 1 g via INTRAVENOUS
  Filled 2018-11-26 (×3): qty 50

## 2018-11-26 MED ORDER — MIDAZOLAM HCL 2 MG/2ML IJ SOLN
2.0000 mg | Freq: Once | INTRAMUSCULAR | Status: AC
  Administered 2018-11-26: 2 mg via INTRAVENOUS

## 2018-11-26 MED ORDER — METHOCARBAMOL 500 MG PO TABS
500.0000 mg | ORAL_TABLET | Freq: Four times a day (QID) | ORAL | Status: DC | PRN
  Administered 2018-11-26 – 2018-11-28 (×6): 500 mg via ORAL
  Filled 2018-11-26 (×6): qty 1

## 2018-11-26 MED ORDER — PROPOFOL 10 MG/ML IV BOLUS
INTRAVENOUS | Status: DC | PRN
  Administered 2018-11-26: 170 mg via INTRAVENOUS

## 2018-11-26 MED ORDER — OXYCODONE HCL 5 MG/5ML PO SOLN: 5.0000 mg | Freq: Once | ORAL | Status: DC | PRN

## 2018-11-26 MED ORDER — ROPIVACAINE HCL 7.5 MG/ML IJ SOLN
INTRAMUSCULAR | Status: DC | PRN
  Administered 2018-11-26 (×2): 20 mL via PERINEURAL

## 2018-11-26 MED ORDER — ACETAMINOPHEN 160 MG/5ML PO SOLN: 325.0000 mg | ORAL | Status: DC | PRN

## 2018-11-26 MED ORDER — DEXAMETHASONE SODIUM PHOSPHATE 10 MG/ML IJ SOLN
INTRAMUSCULAR | Status: DC | PRN
  Administered 2018-11-26: 10 mg via INTRAVENOUS

## 2018-11-26 MED ORDER — OXYCODONE HCL 5 MG PO TABS: 5.0000 mg | ORAL_TABLET | Freq: Once | ORAL | Status: DC | PRN

## 2018-11-26 MED ORDER — CEFAZOLIN SODIUM-DEXTROSE 2-4 GM/100ML-% IV SOLN
2.0000 g | INTRAVENOUS | Status: AC
  Administered 2018-11-26: 2 g via INTRAVENOUS
  Filled 2018-11-26: qty 100

## 2018-11-26 MED ORDER — LACTATED RINGERS IV SOLN
INTRAVENOUS | Status: DC
  Administered 2018-11-26 (×2): via INTRAVENOUS

## 2018-11-26 MED ORDER — KETAMINE HCL 10 MG/ML IJ SOLN
INTRAMUSCULAR | Status: DC | PRN
  Administered 2018-11-26 (×3): 10 mg via INTRAVENOUS

## 2018-11-26 SURGICAL SUPPLY — 70 items
BANDAGE ESMARK 6X9 LF (GAUZE/BANDAGES/DRESSINGS) ×1 IMPLANT
BIT DRILL 3 CALIBR ANKLE (DRILL) ×3 IMPLANT
BIT DRILL CANN CALIB 5.5 (BIT) ×3 IMPLANT
BLADE AVERAGE 25MMX9MM (BLADE) ×1
BLADE AVERAGE 25X9 (BLADE) ×2 IMPLANT
BLADE SURG 10 STRL SS (BLADE) ×6 IMPLANT
BNDG COHESIVE 4X5 TAN STRL (GAUZE/BANDAGES/DRESSINGS) ×3 IMPLANT
BNDG COHESIVE 6X5 TAN STRL LF (GAUZE/BANDAGES/DRESSINGS) ×3 IMPLANT
BNDG ESMARK 6X9 LF (GAUZE/BANDAGES/DRESSINGS) ×3
BNDG GAUZE ELAST 4 BULKY (GAUZE/BANDAGES/DRESSINGS) ×6 IMPLANT
BUR EGG ELITE 4.0 (BURR) ×2 IMPLANT
BUR EGG ELITE 4.0MM (BURR) ×1
CANISTER WOUNDNEG PRESSURE 500 (CANNISTER) ×3 IMPLANT
COVER MAYO STAND STRL (DRAPES) IMPLANT
COVER SURGICAL LIGHT HANDLE (MISCELLANEOUS) ×6 IMPLANT
COVER WAND RF STERILE (DRAPES) ×3 IMPLANT
CUFF TOURNIQUET SINGLE 34IN LL (TOURNIQUET CUFF) IMPLANT
CUFF TOURNIQUET SINGLE 44IN (TOURNIQUET CUFF) IMPLANT
DRAPE C-ARM 42X72 X-RAY (DRAPES) IMPLANT
DRAPE INCISE IOBAN 66X45 STRL (DRAPES) IMPLANT
DRAPE OEC MINIVIEW 54X84 (DRAPES) ×3 IMPLANT
DRAPE ORTHO SPLIT 77X108 STRL (DRAPES)
DRAPE SURG ORHT 6 SPLT 77X108 (DRAPES) IMPLANT
DRAPE U-SHAPE 47X51 STRL (DRAPES) ×3 IMPLANT
DRESSING PREVENA PLUS CUSTOM (GAUZE/BANDAGES/DRESSINGS) ×1 IMPLANT
DRSG ADAPTIC 3X8 NADH LF (GAUZE/BANDAGES/DRESSINGS) ×3 IMPLANT
DRSG EMULSION OIL 3X3 NADH (GAUZE/BANDAGES/DRESSINGS) ×3 IMPLANT
DRSG PAD ABDOMINAL 8X10 ST (GAUZE/BANDAGES/DRESSINGS) ×3 IMPLANT
DRSG PREVENA PLUS CUSTOM (GAUZE/BANDAGES/DRESSINGS) ×3
DURAPREP 26ML APPLICATOR (WOUND CARE) ×3 IMPLANT
ELECT REM PT RETURN 9FT ADLT (ELECTROSURGICAL) ×3
ELECTRODE REM PT RTRN 9FT ADLT (ELECTROSURGICAL) ×1 IMPLANT
GAUZE SPONGE 4X4 12PLY STRL (GAUZE/BANDAGES/DRESSINGS) ×3 IMPLANT
GLOVE BIOGEL PI IND STRL 9 (GLOVE) ×1 IMPLANT
GLOVE BIOGEL PI INDICATOR 9 (GLOVE) ×2
GLOVE SURG ORTHO 9.0 STRL STRW (GLOVE) ×3 IMPLANT
GOWN STRL REUS W/ TWL LRG LVL3 (GOWN DISPOSABLE) ×1 IMPLANT
GOWN STRL REUS W/ TWL XL LVL3 (GOWN DISPOSABLE) ×3 IMPLANT
GOWN STRL REUS W/TWL LRG LVL3 (GOWN DISPOSABLE) ×2
GOWN STRL REUS W/TWL XL LVL3 (GOWN DISPOSABLE) ×6
KIT BASIN OR (CUSTOM PROCEDURE TRAY) ×3 IMPLANT
KIT TURNOVER KIT B (KITS) ×3 IMPLANT
MANIFOLD NEPTUNE II (INSTRUMENTS) ×3 IMPLANT
MIX DBX 10CC 35% BONE (Bone Implant) ×3 IMPLANT
NS IRRIG 1000ML POUR BTL (IV SOLUTION) ×3 IMPLANT
PACK ORTHO EXTREMITY (CUSTOM PROCEDURE TRAY) ×3 IMPLANT
PAD ARMBOARD 7.5X6 YLW CONV (MISCELLANEOUS) ×6 IMPLANT
PLATE ANKLE FUSION TT RT (Plate) ×3 IMPLANT
SCREW BONE TI LP 4.5X30 (Screw) ×3 IMPLANT
SCREW BONE TI LP 4.5X60 (Screw) ×6 IMPLANT
SCREW LOCK LOW PRO 5.5X75 (Screw) ×3 IMPLANT
SCREW LOW PROFILE 4.5X32 (Screw) ×3 IMPLANT
SCREW LOW PROFILE TI 4.5X38 (Screw) ×3 IMPLANT
SCREW LP TI 5.5X70 (Screw) ×3 IMPLANT
SPONGE LAP 18X18 RF (DISPOSABLE) ×3 IMPLANT
STAPLER VISISTAT 35W (STAPLE) IMPLANT
STOCKINETTE IMPERVIOUS 9X36 MD (GAUZE/BANDAGES/DRESSINGS) IMPLANT
SUCTION FRAZIER HANDLE 10FR (MISCELLANEOUS) ×2
SUCTION TUBE FRAZIER 10FR DISP (MISCELLANEOUS) ×1 IMPLANT
SUT ETHILON 2 0 PSLX (SUTURE) ×9 IMPLANT
SUT VIC AB 0 CT1 27 (SUTURE)
SUT VIC AB 0 CT1 27XBRD ANBCTR (SUTURE) IMPLANT
SUT VIC AB 2-0 CT1 27 (SUTURE)
SUT VIC AB 2-0 CT1 TAPERPNT 27 (SUTURE) IMPLANT
TOWEL OR 17X24 6PK STRL BLUE (TOWEL DISPOSABLE) ×3 IMPLANT
TOWEL OR 17X26 10 PK STRL BLUE (TOWEL DISPOSABLE) ×3 IMPLANT
TUBE CONNECTING 12'X1/4 (SUCTIONS) ×1
TUBE CONNECTING 12X1/4 (SUCTIONS) ×2 IMPLANT
UNDERPAD 30X30 (UNDERPADS AND DIAPERS) ×3 IMPLANT
WATER STERILE IRR 1000ML POUR (IV SOLUTION) ×3 IMPLANT

## 2018-11-26 NOTE — H&P (Signed)
Bradley Espinoza is an 63 y.o. male.   Chief Complaint: Painful nonunion right ankle fusion. HPI: Patient is a 63 year old gentleman who is status post bilateral calcaneal fractures.  Subsequently underwent subtalar fusion followed by tibial calcaneal fusion.  Repeat CT scan shows a fibrous nonunion patient has pain with activities of daily living has failed conservative therapy including casting and wishes to proceed with revision fusion.  Past Medical History:  Diagnosis Date  . Anxiety   . Arthritis    low back pain, lumbar radiculopathy  . Depression   . Fracture    B/L ankles  . GERD (gastroesophageal reflux disease)   . Headache(784.0)    allergy related   . History of kidney stones   . History of stomach ulcers   . Retained orthopedic hardware    failed retained hardware right foot  . Wears glasses     Past Surgical History:  Procedure Laterality Date  . ANKLE FUSION Right 05/11/2015   Procedure: Right Posterior Arthroscopic Subtalar Arthrodesis;  Surgeon: Newt Minion, MD;  Location: [facility];  Service: Orthopedics;  Laterality: Right;  . ANKLE FUSION Right 11/27/2017   Procedure: RIGHT TIBIOCALCANEAL FUSION;  Surgeon: Newt Minion, MD;  Location: [facility];  Service: Orthopedics;  Laterality: Right;  . ANTERIOR LAT LUMBAR FUSION N/A 04/05/2017   Procedure: Extreme Lateral Interbody Fusion - Lumbar three-lumbar four ,exploration of fusion Posterior augmentation with globus addition Removal hardware Lumbar one-three. Lumbar four-sacral one,  Removal internal bone growth stimulator;  Surgeon: Kary Kos, MD;  Location: [facility];  Service: Neurosurgery;  Laterality: N/A;  . BACK SURGERY  2004   x 2  . CERVICAL SPINE SURGERY  2008  . ESOPHAGOGASTRODUODENOSCOPY    . HARDWARE REMOVAL Right 10/09/2014   Procedure: Removal Deep Hardware, Irrigation and Debridement Calcaneus, Place Antibiotic Beads and Wound VAC ;  Surgeon: Newt Minion, MD;  Location: [facility];  Service: Orthopedics;   Laterality: Right;  . HARDWARE REMOVAL Right 08/12/2015   Procedure: Removal Deep Hardware Right Foot;  Surgeon: Newt Minion, MD;  Location: [facility];  Service: Orthopedics;  Laterality: Right;  . I&D EXTREMITY Right 09/15/2014   Procedure: IRRIGATION AND DEBRIDEMENT Ankle;  Surgeon: Renette Butters, MD;  Location: [facility];  Service: Orthopedics;  Laterality: Right;  . INGUINAL HERNIA REPAIR Bilateral   . LAMINECTOMY WITH POSTERIOR LATERAL ARTHRODESIS LEVEL 4 N/A 09/10/2018   Procedure: Posterior Lateral Fusion - Thoracic Eleven-Thoracic Twelve - Thoracic Twelve-Lumbar One - Lumbar One-Lumbar Two - Lumbar Two-Lumbar Three with instrumentaion and PLA;  Surgeon: Kary Kos, MD;  Location: [facility];  Service: Neurosurgery;  Laterality: N/A;  Posterior Lateral Fusion - Thoracic Eleven-Thoracic Twelve - Thoracic Twelve-Lumbar One - Lumbar One-Lumbar Two - Lumbar Two-Lumbar Thre  . LUMBAR PERCUTANEOUS PEDICLE SCREW 1 LEVEL N/A 04/05/2017   Procedure: LUMBAR PERCUTANEOUS PEDICLE SCREW LUMBAR THREE-FOUR;  Surgeon: Kary Kos, MD;  Location: [facility];  Service: Neurosurgery;  Laterality: N/A;  . ORIF CALCANEOUS FRACTURE Right 09/19/2014   Procedure: OPEN REDUCTION INTERNAL FIXATION (ORIF) CALCANEOUS FRACTURE;  Surgeon: Newt Minion, MD;  Location: [facility];  Service: Orthopedics;  Laterality: Right;  . ORIF CALCANEOUS FRACTURE Left 09/19/2014   Procedure: OPEN REDUCTION INTERNAL FIXATION (ORIF) CALCANEOUS FRACTURE;  Surgeon: Newt Minion, MD;  Location: [facility];  Service: Orthopedics;  Laterality: Left;    Family History  Problem Relation Age of Onset  . Diabetes Father   . Cancer Other    Social History:  reports that he has been smoking cigars. He started smoking about 42 years ago. He has a 10.00 pack-year smoking history. He has never used smokeless tobacco. He reports that he does not drink alcohol or use drugs.  Allergies:  Allergies  Allergen Reactions  . Codeine Nausea And Vomiting    Medications Prior  to Admission  Medication Sig Dispense Refill  . Docusate Sodium (COLACE PO) Take 1 tablet by mouth daily as needed (constipation).    . gabapentin (NEURONTIN) 300 MG capsule Take 300-600 mg by mouth See admin instructions. Take 300mg  by mouth in the morning and afternoon, take 600 mg at bedtime    . ibuprofen (ADVIL,MOTRIN) 200 MG tablet Take 600 mg by mouth every 6 (six) hours as needed for headache.    . meloxicam (MOBIC) 15 MG tablet Take 15 mg by mouth daily.    . methocarbamol (ROBAXIN) 750 MG tablet Take 750 mg by mouth every 8 (eight) hours as needed for muscle spasms.    Marland Kitchen oxyCODONE-acetaminophen (PERCOCET) 10-325 MG tablet Take 1 tablet by mouth every 6 (six) hours as needed for pain. 20 tablet 0  . ranitidine (ZANTAC) 150 MG tablet Take 150 mg by mouth daily as needed for heartburn.    . tamsulosin (FLOMAX) 0.4 MG CAPS capsule Take 0.4 mg by mouth daily.    Marland Kitchen testosterone cypionate (DEPOTESTOSTERONE CYPIONATE) 200 MG/ML injection Inject 200 mg into the muscle every 14 (fourteen) days.    . methocarbamol (ROBAXIN) 500 MG tablet Take 1 tablet (500 mg total) by mouth every 6 (six) hours as needed for muscle spasms. (Patient not taking: Reported on 11/19/2018) 30 tablet 0    No results found for this or any previous visit (from the past 48 hour(s)). No results found.  Review of Systems  All other systems reviewed and are negative.   There were no vitals taken for this visit. Physical Exam  Examination patient has a palpable dorsalis pedis pulse his foot is plantigrade.  He has pain with weightbearing he is limping attempted movement of the ankle is painful.  CT scan shows a fibrous nonunion. Assessment/Plan Assessment: Fibrous nonunion tibial calcaneal fusion right ankle.  Plan: We will plan for removal of deep retained hardware plan for revision fusion through the anterior approach.  Risk and benefits were discussed including infection neurovascular injury nonhealing of the wound  need for additional surgery.  Patient states he understands wished to proceed at this time.  Again discussed the importance of smoking cessation including cigarettes and cigars.  Newt Minion, MD 11/26/2018, 7:46 AM

## 2018-11-26 NOTE — Op Note (Signed)
11/26/2018  11:52 AM  PATIENT:  Bradley Espinoza    PRE-OPERATIVE DIAGNOSIS:  Non-Union Right Ankle Fusion  POST-OPERATIVE DIAGNOSIS:  Same  PROCEDURE:  REMOVAL RIGHT TIBIOCALCANEAL NAIL, RIGHT ANTERIOR TIBIAL TALAR CALCANEAL FUSION, APPLY VAC C-arm fluoroscopy  SURGEON:  Newt Minion, MD  PHYSICIAN ASSISTANT: April Green ANESTHESIA:   General  PREOPERATIVE INDICATIONS:  Bradley Espinoza is a  63 y.o. male with a diagnosis of Non-Union Right Ankle Fusion who failed conservative measures and elected for surgical management.    The risks benefits and alternatives were discussed with the patient preoperatively including but not limited to the risks of infection, bleeding, nerve injury, cardiopulmonary complications, the need for revision surgery, among others, and the patient was willing to proceed.  OPERATIVE IMPLANTS: To verify reduction and to assist with hardware removal  @ENCIMAGES @  OPERATIVE FINDINGS: Fibrous nonunion of the ankle  OPERATIVE PROCEDURE: Patient was brought the operating room underwent a general anesthetic.  After adequate levels anesthesia were obtained patient's right lower extremity was prepped using ChloraPrep and draped into a sterile field a timeout was called.  Attention was first focused on removal of the deep retained hardware.  The heel incision was made and the nail was identified.  This was then secured with the locking bolt after the cap was removed.  A posterior incision was made in the 2 posterior locking screws were removed.  A separate incision was made proximal laterally in the proximal lateral screw was removed.  The nail was then removed without complications.  The wounds were irrigated normal saline.  An anterior incision was then made between the anterior tibial tendon and the EHL.  This was carried sharply down to bone and subperiosteal dissection was used to identify the tibial talar joint.  There was a fibrous nonunion.  Using a  oscillating saw a bur curette and osteotome the fibrous nonunion was resected perpendicular to the long axis of the tibia.  This was debrided back to bleeding viable subchondral bone.  Bone graft was then used 10 cc this was packed into the tibial canal and between the joint.  The joint was then compressed the 2 distal screws were placed to lock in from the talus to the calcaneus to stabilize the subtalar joint a compression screw was then placed obliquely across the tibial talar joint for further compression a separate compression screw was then placed more proximally with a screw proximal and the oblique called to provide further compression.  Another compression screw was placed proximally as well.  C-arm fluoroscopy verified alignment in both AP and lateral planes.  Wounds were irrigated with normal saline incisions were closed using 2-0 nylon.  A customizable Praveena VAC was applied this had a good suction fit compression wrap was applied patient was extubated taken the PACU in stable condition   DISCHARGE PLANNING:  Antibiotic duration: 24 hours antibiotics  Weightbearing: Nonweightbearing on the right  Pain medication: High-dose opioid pathway  Dressing care/ Wound VAC: Continue wound VAC for 1 week after discharge  Ambulatory devices: Walker or crutches  Discharge to: Home  Follow-up: In the office 1 week post operative.

## 2018-11-26 NOTE — Anesthesia Procedure Notes (Signed)
Anesthesia Regional Block: Popliteal block   Pre-Anesthetic Checklist: ,, timeout performed, Correct Patient, Correct Site, Correct Laterality, Correct Procedure,, site marked, risks and benefits discussed, Surgical consent, Pre-op evaluation,  At surgeon's request  Laterality: Right  Prep: chloraprep       Needles:  Injection technique: Single-shot      Needle Gauge: 25     Additional Needles:   Narrative:  Start time: 11/26/2018 10:00 AM End time: 11/26/2018 10:20 AM  Performed by: Personally  Anesthesiologist: Janeece Riggers, MD  Additional Notes: A functioning IV was confirmed and monitors were applied.  Sterile prep and drape, hand hygiene and sterile gloves were used.  Negative aspiration and test dose prior to incremental administration of local anesthetic using the 25 ga needle. 5 ports used.  The patient tolerated the procedure well.

## 2018-11-26 NOTE — Anesthesia Postprocedure Evaluation (Signed)
Anesthesia Post Note  Patient: Bradley Espinoza  Procedure(s) Performed: REMOVAL RIGHT TIBIOCALCANEAL NAIL (Right ) RIGHT ANTERIOR ANKLE FUSION, APPLY VAC (Right )     Patient location during evaluation: PACU Anesthesia Type: General Level of consciousness: awake and alert Pain management: pain level controlled Vital Signs Assessment: post-procedure vital signs reviewed and stable Respiratory status: spontaneous breathing, nonlabored ventilation, respiratory function stable and patient connected to nasal cannula oxygen Cardiovascular status: blood pressure returned to baseline and stable Postop Assessment: no apparent nausea or vomiting Anesthetic complications: no    Last Vitals:  Vitals:   11/26/18 1445 11/26/18 1500  BP:    Pulse: (!) 55 75  Resp: 18 16  Temp:    SpO2: 96% 97%    Last Pain:  Vitals:   11/26/18 1300  TempSrc:   PainSc: 9                  Rhyli Depaula

## 2018-11-26 NOTE — Progress Notes (Signed)
Orthopedic Tech Progress Note Patient Details:  Bradley Espinoza Nov 19, 1955 [id number]   Ortho Devices Type of Ortho Device: CAM walker Ortho Device/Splint Location: right foot Ortho Device/Splint Interventions: Application, Adjustment, Ordered   Post Interventions Patient Tolerated: Well Instructions Provided: Care of device, Adjustment of device   Janit Pagan 11/26/2018, 4:15 PM

## 2018-11-26 NOTE — Transfer of Care (Signed)
Immediate Anesthesia Transfer of Care Note  Patient: Bradley Espinoza  Procedure(s) Performed: REMOVAL RIGHT TIBIOCALCANEAL NAIL (Right ) RIGHT ANTERIOR ANKLE FUSION, APPLY VAC (Right )  Patient Location: PACU  Anesthesia Type:General  Level of Consciousness: drowsy  Airway & Oxygen Therapy: Patient Spontanous Breathing and Patient connected to nasal cannula oxygen  Post-op Assessment: Report given to RN and Post -op Vital signs reviewed and stable  Post vital signs: Reviewed and stable  Last Vitals:  Vitals Value Taken Time  BP 157/89 11/26/2018 12:01 PM  Temp    Pulse 66 11/26/2018 12:08 PM  Resp 12 11/26/2018 12:08 PM  SpO2 94 % 11/26/2018 12:08 PM  Vitals shown include unvalidated device data.  Last Pain:  Vitals:   11/26/18 0828  TempSrc:   PainSc: 7       Patients Stated Pain Goal: 2 (67/54/49 2010)  Complications: No apparent anesthesia complications

## 2018-11-26 NOTE — Anesthesia Procedure Notes (Signed)
Procedure Name: LMA Insertion Date/Time: 11/26/2018 10:29 AM Performed by: Imagene Riches, CRNA Pre-anesthesia Checklist: Patient identified, Emergency Drugs available, Suction available and Patient being monitored Patient Re-evaluated:Patient Re-evaluated prior to induction Oxygen Delivery Method: Circle System Utilized Preoxygenation: Pre-oxygenation with 100% oxygen Induction Type: IV induction Ventilation: Mask ventilation without difficulty LMA: LMA inserted LMA Size: 4.0 Number of attempts: 1 Airway Equipment and Method: Bite block Placement Confirmation: positive ETCO2 Tube secured with: Tape Dental Injury: Teeth and Oropharynx as per pre-operative assessment

## 2018-11-27 ENCOUNTER — Encounter (HOSPITAL_COMMUNITY): Payer: Self-pay | Admitting: Orthopedic Surgery

## 2018-11-27 NOTE — Care Management (Signed)
Patient is 63 yr old gentleman s/p right tibiocalcaneal nail, right tibial talar calcaneal fusion with application of Vac. CM spoke with patient concerning discharge plan and DME. Patient says he has crutches, walker and wheel chair at home, will have assistance of girlfriend at discharge, no HH needs identified.

## 2018-11-27 NOTE — Plan of Care (Signed)

## 2018-11-27 NOTE — Evaluation (Addendum)
Physical Therapy Evaluation Patient Details Name: Bradley Espinoza MRN: [id number] DOB: 1955/11/06 Today's Date: 11/27/2018   History of Present Illness  Patient is a 63 y/o male s/p REMOVAL RIGHT TIBIOCALCANEAL NAIL, RIGHT ANTERIOR TIBIAL TALAR CALCANEAL FUSION, APPLY VAC on 11/26/2018. PMH signficant for B calcaneal fx, depression, back surgery, anxiety.    Clinical Impression  Patient admitted s/p above listed procedure. Patient reports Mod I with mobility prior to admission. Patient today with NWB status at R LE with required use of RW for all OOB mobility. Able to perform mobility at [facility] guard level for safety and patient able to maintain NWB status. NO LOB or overt instability. Education and training on stair navigation (1 step to simulate home environment) with good carryover by patient. PT to continue to follow acutely.      Follow Up Recommendations Supervision - Intermittent;Follow surgeon's recommendation for DC plan and follow-up therapies    Equipment Recommendations  None recommended by PT    Recommendations for Other Services       Precautions / Restrictions Precautions Precautions: Fall Required Braces or Orthoses: Other Brace Other Brace: CAM walker Restrictions Weight Bearing Restrictions: Yes RLE Weight Bearing: Non weight bearing      Mobility  Bed Mobility               General bed mobility comments: up in restroom upon arrival and only returned to sitting EOB for bathing  Transfers Overall transfer level: Needs assistance Equipment used: Rolling walker (2 wheeled) Transfers: Sit to/from [facility] Sit to Stand: Min guard Stand pivot transfers: Min guard       General transfer comment: min guard for safety with cueing to maintain NWB at R LE  Ambulation/Gait Ambulation/Gait assistance: Min guard Gait Distance (Feet): 30 Feet Assistive device: Rolling walker (2 wheeled)   Gait velocity: decreased   General Gait  Details: hop to pattern for in room navigation, good use of B UE to offload LE; no LOB or overt instability  Stairs Stairs: Yes Stairs assistance: Min guard Stair Management: No rails;Step to pattern;Backwards;Forwards;With walker Number of Stairs: 1 General stair comments: backwards up 1 step, then forwards off of step; no LOB; educated on home safety with this method  Wheelchair Mobility    Modified Rankin (Stroke Patients Only)       Balance Overall balance assessment: Mild deficits observed, not formally tested                                           Pertinent Vitals/Pain Pain Assessment: 0-10 Pain Score: 6  Pain Location: R foot Pain Descriptors / Indicators: Aching;Discomfort;Grimacing;Guarding Pain Intervention(s): Limited activity within patient's tolerance;Monitored during session;Repositioned;RN gave pain meds during session    Home Living Family/patient expects to be discharged to:: Private residence Living Arrangements: Alone Available Help at Discharge: Friend(s);Available PRN/intermittently Type of Home: House Home Access: Stairs to enter Entrance Stairs-Rails: None Entrance Stairs-Number of Steps: 1 Home Layout: One level Home Equipment: Cane - single point;Shower seat;Walker - 4 wheels;Hospital bed;Wheelchair - manual;Bedside commode      Prior Function Level of Independence: Independent with assistive device(s)         Comments: has a good bit of DME that is available for use at any time     Hand Dominance   Dominant Hand: Right    Extremity/Trunk Assessment   Upper Extremity Assessment  Upper Extremity Assessment: Defer to OT evaluation    Lower Extremity Assessment Lower Extremity Assessment: Generalized weakness;RLE deficits/detail RLE Deficits / Details: expected post op pain ans weakness RLE: Unable to fully assess due to immobilization RLE Sensation: decreased light touch    Cervical / Trunk  Assessment Cervical / Trunk Assessment: Normal  Communication   Communication: No difficulties  Cognition Arousal/Alertness: Awake/alert Behavior During Therapy: WFL for tasks assessed/performed Overall Cognitive Status: Within Functional Limits for tasks assessed                                        General Comments      Exercises     Assessment/Plan    PT Assessment Patient needs continued PT services  PT Problem List Decreased strength;Decreased activity tolerance;Decreased balance;Decreased mobility;Decreased knowledge of use of DME;Decreased safety awareness;Pain       PT Treatment Interventions DME instruction;Gait training;Stair training;Functional mobility training;Therapeutic activities;Therapeutic exercise;Balance training;Patient/family education    PT Goals (Current goals can be found in the Care Plan section)  Acute Rehab PT Goals Patient Stated Goal: return home tomorrow PT Goal Formulation: With patient Time For Goal Achievement: 12/04/18 Potential to Achieve Goals: Good    Frequency Min 3X/week   Barriers to discharge        Co-evaluation               AM-PAC PT "6 Clicks" Mobility  Outcome Measure Help needed turning from your back to your side while in a flat bed without using bedrails?: None Help needed moving from lying on your back to sitting on the side of a flat bed without using bedrails?: None Help needed moving to and from a bed to a chair (including a wheelchair)?: A Little Help needed standing up from a chair using your arms (e.g., wheelchair or bedside chair)?: A Little Help needed to walk in hospital room?: A Little Help needed climbing 3-5 steps with a railing? : A Lot 6 Click Score: 19    End of Session Equipment Utilized During Treatment: Gait belt Activity Tolerance: Patient tolerated treatment well Patient left: in bed;with call bell/phone within reach Nurse Communication: Mobility status PT Visit  Diagnosis: Unsteadiness on feet (R26.81);Other abnormalities of gait and mobility (R26.89);Muscle weakness (generalized) (M62.81)    Time: 9024-0973 PT Time Calculation (min) (ACUTE ONLY): 28 min   Charges:   PT Evaluation $PT Eval Moderate Complexity: 1 Mod PT Treatments $Gait Training: 8-22 mins        Lanney Gins, PT, DPT Supplemental Physical Therapist 11/27/18 10:00 AM Pager: [phone] Office: [phone]

## 2018-11-28 MED ORDER — CHLORHEXIDINE GLUCONATE CLOTH 2 % EX PADS: 6.0000 | MEDICATED_PAD | Freq: Every day | CUTANEOUS | Status: DC

## 2018-11-28 MED ORDER — OXYCODONE-ACETAMINOPHEN 10-325 MG PO TABS: 1.0000 | ORAL_TABLET | ORAL | 0 refills | Status: DC | PRN

## 2018-11-28 MED ORDER — MUPIROCIN 2 % EX OINT: 1.0000 "application " | TOPICAL_OINTMENT | Freq: Two times a day (BID) | CUTANEOUS | Status: DC

## 2018-11-28 NOTE — Progress Notes (Signed)
Subjective: 2 Days Post-Op Procedure(s) (LRB): REMOVAL RIGHT TIBIOCALCANEAL NAIL (Right) RIGHT ANTERIOR ANKLE FUSION, APPLY VAC (Right) Patient reports pain as severe.   Reports just took IV pain medication earlier this am and taking up to 15 mg oxyIR at a time and still having severe pain. Recent spine surgery as well and was on Percocet 10-325 mg pre operatively for pain management.  Objective: Vital signs in last 24 hours: Temp:  [97.9 F (36.6 C)-99.3 F (37.4 C)] 97.9 F (36.6 C) (02/21 0544) Pulse Rate:  [60-66] 60 (02/21 0544) Resp:  [20] 20 (02/21 0544) BP: (130-134)/(55-76) 134/69 (02/21 0544) SpO2:  [95 %-98 %] 95 % (02/21 0544)  Intake/Output from previous day: 02/20 0701 - 02/21 0700 In: 825.6 [P.O.:480; I.V.:345.6] Out: 1500 [Urine:1250; Drains:250] Intake/Output this shift: Total I/O In: 240 [P.O.:240] Out: -   Recent Labs    11/26/18 0818  HGB 14.1   Recent Labs    11/26/18 0818  WBC 5.3  RBC 4.67  HCT 44.8  PLT 198   Recent Labs    11/26/18 0818  NA 141  K 3.8  CL 107  CO2 24  BUN 9  CREATININE 0.72  GLUCOSE 93  CALCIUM 8.7*   No results for input(s): LABPT, INR in the last 72 hours.  VAC dressing in place over the right ankle and functioning well. VAC canister now with about 325 cc of serosanguinous drainage( about 75 cc over 24 hours) . Moving toes without difficulty .    Assessment/Plan: 2 Days Post-Op Procedure(s) (LRB): REMOVAL RIGHT TIBIOCALCANEAL NAIL (Right) RIGHT ANTERIOR ANKLE FUSION, APPLY VAC (Right) Plan for discharge tomorrow Will send prescription for pain medications for after discharge to pharmacy today.    Erlinda Hong, PA-C 11/28/2018, 10:07 AM  [place]

## 2018-11-28 NOTE — Discharge Instructions (Signed)
Keep Prevena VAC plugged into wall outlet as much as possible  Non weight bearing on foot and keep elevated as much as possible.   Follow up in 1 week.

## 2018-11-28 NOTE — Discharge Summary (Signed)
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)   Malunion of joint fusion ([place])   Surgeries: Procedure(s): REMOVAL RIGHT TIBIOCALCANEAL NAIL RIGHT ANTERIOR ANKLE FUSION, APPLY VAC on 11/26/2018    Consultants:   Discharged Condition: Improved  Hospital Course: Bradley Espinoza is an 63 y.o. male who was admitted 11/26/2018 with a chief complaint of right ankle pain and non union of fusion, with a final diagnosis of Non-Union Right Ankle Fusion.  Patient was brought to the operating room on 11/26/2018 and underwent Procedure(s): REMOVAL RIGHT TIBIOCALCANEAL NAIL RIGHT ANTERIOR ANKLE FUSION, APPLY VAC.    Patient was given perioperative antibiotics:  Anti-infectives (From admission, onward)   Start     Dose/Rate Route Frequency Ordered Stop   11/26/18 1600  ceFAZolin (ANCEF) IVPB 1 g/50 mL premix     1 g 100 mL/hr over 30 Minutes Intravenous Every 6 hours 11/26/18 1539 11/27/18 0415   11/26/18 0730  ceFAZolin (ANCEF) IVPB 2g/100 mL premix     2 g 200 mL/hr over 30 Minutes Intravenous On call to O.R. 11/26/18 1062 11/26/18 1032    .  Patient was given sequential compression devices, early ambulation, and aspirin for DVT prophylaxis.  Recent vital signs:  Patient Vitals for the past 24 hrs:  BP Temp Temp src Pulse Resp SpO2  11/28/18 1443 129/76 98.1 F (36.7 C) Oral 65 16 98 %  11/28/18 0544 134/69 97.9 F (36.6 C) Oral 60 20 95 %  11/27/18 2135 134/76 97.9 F (36.6 C) Oral 66 20 98 %  .  Recent laboratory studies: No results found.  Discharge Medications:   Allergies as of 11/28/2018      Reactions   Codeine Nausea And Vomiting      Medication List    TAKE these medications   COLACE PO Take 1 tablet by mouth daily as needed (constipation).   gabapentin 300 MG capsule Commonly known as:  NEURONTIN Take 300-600 mg by mouth See admin instructions. Take 300mg  by mouth in the morning and afternoon, take 600 mg at bedtime   ibuprofen 200 MG  tablet Commonly known as:  ADVIL,MOTRIN Take 600 mg by mouth every 6 (six) hours as needed for headache.   meloxicam 15 MG tablet Commonly known as:  MOBIC Take 15 mg by mouth daily.   methocarbamol 750 MG tablet Commonly known as:  ROBAXIN Take 750 mg by mouth every 8 (eight) hours as needed for muscle spasms.   methocarbamol 500 MG tablet Commonly known as:  ROBAXIN Take 1 tablet (500 mg total) by mouth every 6 (six) hours as needed for muscle spasms.   oxyCODONE-acetaminophen 10-325 MG tablet Commonly known as:  PERCOCET Take 1 tablet by mouth every 6 (six) hours as needed for pain. What changed:  Another medication with the same name was added. Make sure you understand how and when to take each.   oxyCODONE-acetaminophen 10-325 MG tablet Commonly known as:  PERCOCET Take 1 tablet by mouth every 4 (four) hours as needed for pain. What changed:  You were already taking a medication with the same name, and this prescription was added. Make sure you understand how and when to take each.   ranitidine 150 MG tablet Commonly known as:  ZANTAC Take 150 mg by mouth daily as needed for heartburn.   tamsulosin 0.4 MG Caps capsule Commonly known as:  FLOMAX Take 0.4 mg by mouth daily.   testosterone cypionate 200 MG/ML injection Commonly known as:  DEPOTESTOSTERONE CYPIONATE Inject  200 mg into the muscle every 14 (fourteen) days.       Diagnostic Studies: Nm Hepato W/eject Fract  Result Date: 11/21/2018 CLINICAL DATA:  Upper abdominal pain EXAM: NUCLEAR MEDICINE HEPATOBILIARY IMAGING WITH GALLBLADDER EF VIEWS: Anterior right upper quadrant RADIOPHARMACEUTICALS:  5.23 mCi Tc-7m  Choletec IV COMPARISON:  None. FINDINGS: Liver uptake of radiotracer is unremarkable. There is prompt visualization of gallbladder and small bowel, indicating patency of the cystic and common bile ducts. The patient consumed 8 ounces of Ensure Plus orally with calculation of the computer generated ejection  fraction of radiotracer from the gallbladder. The patient experienced abdominal pain with the oral Ensure consumption. The computer generated ejection fraction of radiotracer from the gallbladder is normal at 88%, normal greater than 33% using the oral agent. IMPRESSION: Normal ejection fraction of radiotracer from the gallbladder. Patient experienced pain with the oral Ensure consumption. Cystic and common bile ducts are patent as is evidenced by visualization of gallbladder and small bowel. Electronically Signed   By: Lowella Grip III M.D.   On: 11/21/2018 15:24   Xr Ankle Complete Right  Result Date: 11/20/2018 3 view radiographs of the right ankle shows a fibrous nonunion of the tibial talar joint status post tibial calcaneal fusion.  US Abdomen Limited Ruq  Result Date: 11/07/2018 CLINICAL DATA:  63 year old male with right upper quadrant pain EXAM: ULTRASOUND ABDOMEN LIMITED RIGHT UPPER QUADRANT COMPARISON:  None. FINDINGS: Gallbladder: Echogenic focus within the gallbladder lumen with posterior shadowing measuring 5.5 mm. Negative sonographic Murphy's sign. No pericholecystic fluid or gallbladder wall thickening. Common bile duct: Diameter: 7 mm-8 mm Liver: Increased echogenicity of liver parenchyma. No nodular contour. No focal liver lesion. Portal vein is patent on color Doppler imaging with normal direction of blood flow towards the liver. IMPRESSION: Cholelithiasis without sonographic evidence of acute cholecystitis. Increased echogenicity of liver parenchyma may indicate steatosis or other medical liver disease. Electronically Signed   By: Corrie Mckusick D.O.   On: 11/07/2018 14:39    Patient benefited maximally from their hospital stay and there were no complications.     Disposition: Discharge disposition: 01-Home or Self Care      Discharge Instructions    Call MD / Call 911   Complete by:  As directed    If you experience chest pain or shortness of breath, CALL 911 and be  transported to the hospital emergency room.  If you develope a fever above 101 F, pus (white drainage) or increased drainage or redness at the wound, or calf pain, call your surgeon's office.   Constipation Prevention   Complete by:  As directed    Drink plenty of fluids.  Prune juice may be helpful.  You may use a stool softener, such as Colace (over the counter) 100 mg twice a day.  Use MiraLax (over the counter) for constipation as needed.   Diet - low sodium heart healthy   Complete by:  As directed    Discharge instructions   Complete by:  As directed    Keep Prevena VAC plugged into wall outlet as much as possible. Non weight bearing on foot and elevate as much as possible.  Follow up in 1 week.   Increase activity slowly as tolerated   Complete by:  As directed      Follow-up Information    Newt Minion, MD In 1 week.   Specialty:  Orthopedic Surgery Contact information: [place] [address] [phone]  SignedErlinda Hong, PA-C 11/28/2018, 3:17 PM [employer] [phone]

## 2018-11-28 NOTE — Plan of Care (Signed)
  Problem: Clinical Measurements: Goal: Ability to maintain clinical measurements within normal limits will improve Outcome: Progressing Goal: Diagnostic test results will improve Outcome: Progressing Goal: Cardiovascular complication will be avoided Outcome: Progressing   Problem: Activity: Goal: Risk for activity intolerance will decrease Outcome: Progressing   Problem: Nutrition: Goal: Adequate nutrition will be maintained Outcome: Progressing   Problem: Coping: Goal: Level of anxiety will decrease Outcome: Progressing

## 2018-11-28 NOTE — Progress Notes (Signed)
Went over discharge paper work with full understanding and  Medications that were sent to his pharmacy

## 2018-11-28 NOTE — Progress Notes (Signed)
Physical Therapy Treatment Patient Details Name: Bradley Espinoza MRN: [id number] DOB: Aug 01, 1956 Today's Date: 11/28/2018    History of Present Illness Patient is a 63 y/o male s/p REMOVAL RIGHT TIBIOCALCANEAL NAIL, RIGHT ANTERIOR TIBIAL TALAR CALCANEAL FUSION, APPLY VAC on 11/26/2018. PMH signficant for B calcaneal fx, depression, back surgery, anxiety.    PT Comments    Patient seen for mobility progression. Pt requires cues to maintain NWB status but overall supervision/min guard assist for OOB mobility. Current plan remains appropriate.    Follow Up Recommendations  Supervision - Intermittent;Follow surgeon's recommendation for DC plan and follow-up therapies     Equipment Recommendations  None recommended by PT    Recommendations for Other Services       Precautions / Restrictions Precautions Precautions: Fall Required Braces or Orthoses: Other Brace Other Brace: CAM walker Restrictions Weight Bearing Restrictions: Yes RLE Weight Bearing: Non weight bearing    Mobility  Bed Mobility Overal bed mobility: Independent                Transfers Overall transfer level: Needs assistance Equipment used: Rolling walker (2 wheeled) Transfers: Sit to/from [facility] Sit to Stand: Min guard         General transfer comment: for safety; cues needed to maintain NWB status  Ambulation/Gait Ambulation/Gait assistance: Min Gaffer (Feet): 30 Feet Assistive device: Rolling walker (2 wheeled) Gait Pattern/deviations: Step-to pattern Gait velocity: decreased   General Gait Details: cues for NWB status which pt is able to correct with cues   Stairs             Wheelchair Mobility    Modified Rankin (Stroke Patients Only)       Balance Overall balance assessment: Mild deficits observed, not formally tested                                          Cognition Arousal/Alertness:  Awake/alert Behavior During Therapy: WFL for tasks assessed/performed;Agitated Overall Cognitive Status: Within Functional Limits for tasks assessed                                 General Comments: preoccupied by when he would be discharged      Exercises      General Comments        Pertinent Vitals/Pain Pain Assessment: Faces Faces Pain Scale: Hurts a little bit Pain Location: R foot Pain Descriptors / Indicators: Discomfort;Guarding Pain Intervention(s): Monitored during session;Premedicated before session;Repositioned    Home Living                      Prior Function            PT Goals (current goals can now be found in the care plan section) Acute Rehab PT Goals Patient Stated Goal: return home tomorrow Progress towards PT goals: Progressing toward goals    Frequency    Min 3X/week      PT Plan Current plan remains appropriate    Co-evaluation              AM-PAC PT "6 Clicks" Mobility   Outcome Measure  Help needed turning from your back to your side while in a flat bed without using bedrails?: None Help needed moving from lying on your back to sitting on  the side of a flat bed without using bedrails?: None Help needed moving to and from a bed to a chair (including a wheelchair)?: A Little Help needed standing up from a chair using your arms (e.g., wheelchair or bedside chair)?: A Little Help needed to walk in hospital room?: A Little Help needed climbing 3-5 steps with a railing? : A Lot 6 Click Score: 19    End of Session Equipment Utilized During Treatment: Gait belt Activity Tolerance: Patient tolerated treatment well Patient left: in bed;with call bell/phone within reach;with nursing/sitter in room;Other (comment)(sitting EOB ) Nurse Communication: Mobility status PT Visit Diagnosis: Unsteadiness on feet (R26.81);Other abnormalities of gait and mobility (R26.89);Muscle weakness (generalized) (M62.81)     Time:  9371-6967 PT Time Calculation (min) (ACUTE ONLY): 15 min  Charges:  $Gait Training: 8-22 mins                     Earney Navy, PTA Acute Rehabilitation Services Pager: [phone] Office: [phone]     Darliss Cheney 11/28/2018, 4:40 PM

## 2018-12-05 ENCOUNTER — Encounter (INDEPENDENT_AMBULATORY_CARE_PROVIDER_SITE_OTHER): Payer: Self-pay | Admitting: Physician Assistant

## 2018-12-05 ENCOUNTER — Ambulatory Visit (INDEPENDENT_AMBULATORY_CARE_PROVIDER_SITE_OTHER): Payer: PPO | Admitting: Physician Assistant

## 2018-12-05 VITALS — Ht 73.0 in | Wt 165.0 lb

## 2018-12-05 DIAGNOSIS — Z981 Arthrodesis status: Secondary | ICD-10-CM

## 2018-12-05 NOTE — Progress Notes (Signed)
Office Visit Note   Patient: Bradley Espinoza           Date of Birth: [birth year]           MRN: [id number] Visit Date: 12/05/2018              Requested by: Celene Squibb, MD [place], [place] [address] PCP: Celene Squibb, MD  Chief Complaint  Patient presents with  . Right Foot - Routine Post Op    11/26/2018 removal Tibiocalcaneal nail      HPI: The patient is a 63 year old gentleman who is seen for postoperative follow-up following revision of right ankle fusion.  He underwent removal of his right tibiocalcaneal nail, and right anterior tibial talar calcaneal fusion with VAC placement on 11/26/2018.  He comes in today for [facility] removal.  He reports he is back on his usual pain medication that he was on following his spine surgery and his chronic pain meds.  He is nonweightbearing with crutches and in a fracture boot.  He reports pain whenever he does not elevate the leg.  Assessment & Plan: Visit Diagnoses:  1. S/P ankle fusion     Plan: The VAC dressing was removed today and all incisions are healing well.  Instructed the patient to apply dry dressings and Ace wrap for edema control as well as continued elevation for edema control.  Instructed the patient to continue with strict nonweightbearing on the right lower extremity.  He will follow-up in 1 week or sooner should he have difficulties in the interim.  Follow-Up Instructions: Return in about 6 days (around 12/11/2018).   Ortho Exam  Patient is alert, oriented, no adenopathy, well-dressed, normal affect, normal respiratory effort. The right ankle foot and plantar foot incisions are all healing well with scant serous appearing drainage.  Sutures are intact.  There is edema localized to the ankle and foot.  There are palpable pedal pulses.  No signs of cellulitis or infection.  Imaging: No results found. No images are attached to the encounter.  Labs: Lab Results  Component Value Date   HGBA1C 5.1  06/26/2016   REPTSTATUS 04/10/2017 FINAL 04/05/2017   GRAMSTAIN  04/05/2017    FEW WBC PRESENT, PREDOMINANTLY PMN NO ORGANISMS SEEN    CULT No growth aerobically or anaerobically. 04/05/2017     Lab Results  Component Value Date   ALBUMIN 4.3 08/12/2015   ALBUMIN 4.1 05/06/2015   ALBUMIN 4.0 09/15/2014    Body mass index is 21.77 kg/m.  Orders:  No orders of the defined types were placed in this encounter.  No orders of the defined types were placed in this encounter.    Procedures: No procedures performed  Clinical Data: No additional findings.  ROS:  All other systems negative, except as noted in the HPI. Review of Systems  Objective: Vital Signs: Ht 6\' 1"  (1.854 m)   Wt 165 lb (74.8 kg)   BMI 21.77 kg/m   Specialty Comments:  No specialty comments available.  PMFS History: Patient Active Problem List   Diagnosis Date Noted  . Malunion of joint fusion ([place]) 11/26/2018  . 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[facility])   . Status post lumbar spinal fusion 09/10/2018  . S/P ankle fusion 11/27/2017  . Nonunion of subtalar arthrodesis   . Avascular necrosis of talus, right ([place])   . Post-traumatic osteoarthritis, left ankle and foot 10/22/2017  . DDD (degenerative disc disease), lumbar 04/05/2017  . Fracture of L2 vertebra ([place])  01/12/2015  . Acute osteomyelitis of calcaneum ([place]) 10/09/2014  . Dysuria 10/05/2014  . Cellulitis 10/05/2014  . Fall from ladder 09/21/2014  . L2 vertebral fracture ([place]) 09/21/2014  . Bilateral calcaneal fractures 09/21/2014  . Chronic pain 09/21/2014  . Acute blood loss anemia 09/21/2014  . Open fracture of both calcanei 09/15/2014   Past Medical History:  Diagnosis Date  . Anxiety   . Arthritis    low back pain, lumbar radiculopathy  . Depression   . Fracture    B/L ankles  . GERD (gastroesophageal reflux disease)   . Headache(784.0)    allergy related   . History of kidney stones   . History of stomach ulcers   . Retained  orthopedic hardware    failed retained hardware right foot  . Wears glasses     Family History  Problem Relation Age of Onset  . Diabetes Father   . Cancer Other     Past Surgical History:  Procedure Laterality Date  . ANKLE FUSION Right 05/11/2015   Procedure: Right Posterior Arthroscopic Subtalar Arthrodesis;  Surgeon: Newt Minion, MD;  Location: [place];  Service: Orthopedics;  Laterality: Right;  . ANKLE FUSION Right 11/27/2017   Procedure: RIGHT TIBIOCALCANEAL FUSION;  Surgeon: Newt Minion, MD;  Location: [place];  Service: Orthopedics;  Laterality: Right;  . ANKLE FUSION Right 11/26/2018   Procedure: RIGHT ANTERIOR ANKLE FUSION, APPLY VAC;  Surgeon: Newt Minion, MD;  Location: [place];  Service: Orthopedics;  Laterality: Right;  . ANTERIOR LAT LUMBAR FUSION N/A 04/05/2017   Procedure: Extreme Lateral Interbody Fusion - Lumbar three-lumbar four ,exploration of fusion Posterior augmentation with globus addition Removal hardware Lumbar one-three. Lumbar four-sacral one,  Removal internal bone growth stimulator;  Surgeon: Kary Kos, MD;  Location: [place];  Service: Neurosurgery;  Laterality: N/A;  . BACK SURGERY  2004   x 2  . CERVICAL SPINE SURGERY  2008  . ESOPHAGOGASTRODUODENOSCOPY    . HARDWARE REMOVAL Right 10/09/2014   Procedure: Removal Deep Hardware, Irrigation and Debridement Calcaneus, Place Antibiotic Beads and Wound VAC ;  Surgeon: Newt Minion, MD;  Location: [place];  Service: Orthopedics;  Laterality: Right;  . HARDWARE REMOVAL Right 08/12/2015   Procedure: Removal Deep Hardware Right Foot;  Surgeon: Newt Minion, MD;  Location: [place];  Service: Orthopedics;  Laterality: Right;  . HARDWARE REMOVAL Right 11/26/2018   Procedure: REMOVAL RIGHT TIBIOCALCANEAL NAIL;  Surgeon: Newt Minion, MD;  Location: [place];  Service: Orthopedics;  Laterality: Right;  . I&D EXTREMITY Right 09/15/2014   Procedure: IRRIGATION AND DEBRIDEMENT Ankle;  Surgeon: Renette Butters, MD;  Location: [place];  Service: Orthopedics;  Laterality: Right;  . INGUINAL HERNIA REPAIR Bilateral   . LAMINECTOMY WITH POSTERIOR LATERAL ARTHRODESIS LEVEL 4 N/A 09/10/2018   Procedure: Posterior Lateral Fusion - Thoracic Eleven-Thoracic Twelve - Thoracic Twelve-Lumbar One - Lumbar One-Lumbar Two - Lumbar Two-Lumbar Three with instrumentaion and PLA;  Surgeon: Kary Kos, MD;  Location: [place];  Service: Neurosurgery;  Laterality: N/A;  Posterior Lateral Fusion - Thoracic Eleven-Thoracic Twelve - Thoracic Twelve-Lumbar One - Lumbar One-Lumbar Two - Lumbar Two-Lumbar Thre  . LUMBAR PERCUTANEOUS PEDICLE SCREW 1 LEVEL N/A 04/05/2017   Procedure: LUMBAR PERCUTANEOUS PEDICLE SCREW LUMBAR THREE-FOUR;  Surgeon: Kary Kos, MD;  Location: [place];  Service: Neurosurgery;  Laterality: N/A;  . ORIF CALCANEOUS FRACTURE Right 09/19/2014   Procedure: OPEN REDUCTION INTERNAL FIXATION (ORIF) CALCANEOUS FRACTURE;  Surgeon: Newt Minion,  MD;  Location: [place];  Service: Orthopedics;  Laterality: Right;  . ORIF CALCANEOUS FRACTURE Left 09/19/2014   Procedure: OPEN REDUCTION INTERNAL FIXATION (ORIF) CALCANEOUS FRACTURE;  Surgeon: Newt Minion, MD;  Location: [place];  Service: Orthopedics;  Laterality: Left;   Social History   Occupational History    Comment: disabled  Tobacco Use  . Smoking status: Current Some Day Smoker    Packs/day: 1.00    Years: 10.00    Pack years: 10.00    Types: Cigars    Start date: 10/08/1976    Last attempt to quit: 09/07/2014    Years since quitting: 4.2  . Smokeless tobacco: Never Used  . Tobacco comment: 06/26/16 reports smokes a cigar 2 times a week at the most  Substance and Sexual Activity  . Alcohol use: No    Comment: 06/26/16 quit 1998  . Drug use: No  . Sexual activity: Yes    Birth control/protection: None

## 2018-12-08 ENCOUNTER — Encounter (INDEPENDENT_AMBULATORY_CARE_PROVIDER_SITE_OTHER): Payer: Self-pay | Admitting: *Deleted

## 2018-12-08 ENCOUNTER — Encounter (INDEPENDENT_AMBULATORY_CARE_PROVIDER_SITE_OTHER): Payer: Self-pay | Admitting: Internal Medicine

## 2018-12-08 ENCOUNTER — Ambulatory Visit (INDEPENDENT_AMBULATORY_CARE_PROVIDER_SITE_OTHER): Payer: PPO | Admitting: Internal Medicine

## 2018-12-08 VITALS — BP 160/77 | HR 77 | Temp 98.1°F | Ht 73.0 in | Wt 170.9 lb

## 2018-12-08 DIAGNOSIS — R140 Abdominal distension (gaseous): Secondary | ICD-10-CM

## 2018-12-08 DIAGNOSIS — K760 Fatty (change of) liver, not elsewhere classified: Secondary | ICD-10-CM | POA: Diagnosis not present

## 2018-12-08 MED ORDER — OMEPRAZOLE 40 MG PO CPDR: 40.0000 mg | DELAYED_RELEASE_CAPSULE | Freq: Every day | ORAL | 3 refills | Status: DC

## 2018-12-08 NOTE — Patient Instructions (Addendum)
Labs and US abdomen/with elast. Rx for Omeprazole 40mg  sent to his pharmacy.

## 2018-12-08 NOTE — Progress Notes (Signed)
Subjective:    Patient ID: Bradley Espinoza, male    DOB: 1956/04/17, 63 y.o.   MRN: [id number]  HPI Referred by Dr. Merlyn Albert for fatty liver. No recent hx of etoh abuse. Has not drank heavily since 1991. His appetite is okay. No weight loss. BMs move okay.  Never done IV drugs. No GI complaints. At times he has RUQ pain. Pain is random. However, he does have pain after eating sometimes.  He does c/o bloating.  Has been take Gas X for the bloating.   1/31 [place] RUQ: RT upper quadrant pain: IMPRESSION: Cholelithiasis without sonographic evidence of acute cholecystitis.  Increased echogenicity of liver parenchyma may indicate steatosis or other medical liver disease.   11/21/2018 HIDA scan: Normal ejection fraction 88%. Patient experienced pain with the oral administration of Ensure. Cystic and CBD are patient as is evidenced by visualization of gall bladder and small bowel.     Review of Systems Past Medical History:  Diagnosis Date  . Anxiety   . Arthritis    low back pain, lumbar radiculopathy  . Depression   . Fracture    B/L ankles  . GERD (gastroesophageal reflux disease)   . Headache(784.0)    allergy related   . History of kidney stones   . History of stomach ulcers   . Retained orthopedic hardware    failed retained hardware right foot  . Wears glasses     Past Surgical History:  Procedure Laterality Date  . ANKLE FUSION Right 05/11/2015   Procedure: Right Posterior Arthroscopic Subtalar Arthrodesis;  Surgeon: Newt Minion, MD;  Location: [place];  Service: Orthopedics;  Laterality: Right;  . ANKLE FUSION Right 11/27/2017   Procedure: RIGHT TIBIOCALCANEAL FUSION;  Surgeon: Newt Minion, MD;  Location: [place];  Service: Orthopedics;  Laterality: Right;  . ANKLE FUSION Right 11/26/2018   Procedure: RIGHT ANTERIOR ANKLE FUSION, APPLY VAC;  Surgeon: Newt Minion, MD;  Location: [place];  Service: Orthopedics;  Laterality: Right;  . ANTERIOR LAT LUMBAR FUSION N/A  04/05/2017   Procedure: Extreme Lateral Interbody Fusion - Lumbar three-lumbar four ,exploration of fusion Posterior augmentation with globus addition Removal hardware Lumbar one-three. Lumbar four-sacral one,  Removal internal bone growth stimulator;  Surgeon: Kary Kos, MD;  Location: [place];  Service: Neurosurgery;  Laterality: N/A;  . BACK SURGERY  2004   x 2  . CERVICAL SPINE SURGERY  2008  . ESOPHAGOGASTRODUODENOSCOPY    . HARDWARE REMOVAL Right 10/09/2014   Procedure: Removal Deep Hardware, Irrigation and Debridement Calcaneus, Place Antibiotic Beads and Wound VAC ;  Surgeon: Newt Minion, MD;  Location: [place];  Service: Orthopedics;  Laterality: Right;  . HARDWARE REMOVAL Right 08/12/2015   Procedure: Removal Deep Hardware Right Foot;  Surgeon: Newt Minion, MD;  Location: [place];  Service: Orthopedics;  Laterality: Right;  . HARDWARE REMOVAL Right 11/26/2018   Procedure: REMOVAL RIGHT TIBIOCALCANEAL NAIL;  Surgeon: Newt Minion, MD;  Location: [place];  Service: Orthopedics;  Laterality: Right;  . I&D EXTREMITY Right 09/15/2014   Procedure: IRRIGATION AND DEBRIDEMENT Ankle;  Surgeon: Renette Butters, MD;  Location: [place];  Service: Orthopedics;  Laterality: Right;  . INGUINAL HERNIA REPAIR Bilateral   . LAMINECTOMY WITH POSTERIOR LATERAL ARTHRODESIS LEVEL 4 N/A 09/10/2018   Procedure: Posterior Lateral Fusion - Thoracic Eleven-Thoracic Twelve - Thoracic Twelve-Lumbar One - Lumbar One-Lumbar Two - Lumbar Two-Lumbar Three with instrumentaion and PLA;  Surgeon: Kary Kos, MD;  Location: [facility];  Service: Neurosurgery;  Laterality: N/A;  Posterior Lateral Fusion - Thoracic Eleven-Thoracic Twelve - Thoracic Twelve-Lumbar One - Lumbar One-Lumbar Two - Lumbar Two-Lumbar Thre  . LUMBAR PERCUTANEOUS PEDICLE SCREW 1 LEVEL N/A 04/05/2017   Procedure: LUMBAR PERCUTANEOUS PEDICLE SCREW LUMBAR THREE-FOUR;  Surgeon: Kary Kos, MD;  Location: [place];  Service: Neurosurgery;  Laterality: N/A;  . ORIF CALCANEOUS  FRACTURE Right 09/19/2014   Procedure: OPEN REDUCTION INTERNAL FIXATION (ORIF) CALCANEOUS FRACTURE;  Surgeon: Newt Minion, MD;  Location: [place];  Service: Orthopedics;  Laterality: Right;  . ORIF CALCANEOUS FRACTURE Left 09/19/2014   Procedure: OPEN REDUCTION INTERNAL FIXATION (ORIF) CALCANEOUS FRACTURE;  Surgeon: Newt Minion, MD;  Location: [place];  Service: Orthopedics;  Laterality: Left;    Allergies  Allergen Reactions  . Codeine Nausea And Vomiting    Current Outpatient Medications on File Prior to Visit  Medication Sig Dispense Refill  . Docusate Sodium (COLACE PO) Take 1 tablet by mouth daily as needed (constipation).    . gabapentin (NEURONTIN) 300 MG capsule Take 300-600 mg by mouth See admin instructions. Take 300mg  by mouth in the morning and afternoon, take 600 mg at bedtime    . ibuprofen (ADVIL,MOTRIN) 200 MG tablet Take 600 mg by mouth every 6 (six) hours as needed for headache.    . meloxicam (MOBIC) 15 MG tablet Take 15 mg by mouth daily.    . methocarbamol (ROBAXIN) 500 MG tablet Take 1 tablet (500 mg total) by mouth every 6 (six) hours as needed for muscle spasms. 30 tablet 0  . methocarbamol (ROBAXIN) 750 MG tablet Take 750 mg by mouth every 8 (eight) hours as needed for muscle spasms.    [place] oxyCODONE-acetaminophen (PERCOCET) 10-325 MG tablet Take 1 tablet by mouth every 4 (four) hours as needed for pain. 30 tablet 0  . tamsulosin (FLOMAX) 0.4 MG CAPS capsule Take 0.4 mg by mouth as needed.     . testosterone cypionate (DEPOTESTOSTERONE CYPIONATE) 200 MG/ML injection Inject 200 mg into the muscle every 14 (fourteen) days.     No current facility-administered medications on file prior to visit.         Objective:   Physical Exam Blood pressure (!) 160/77, pulse 77, temperature 98.1 F (36.7 C), height 6\' 1"  (1.854 m), weight 170 lb 14.4 oz (77.5 kg). Alert and oriented. Skin warm and dry. Oral mucosa is moist.   . Sclera anicteric, conjunctivae is pink. Thyroid not  enlarged. No cervical lymphadenopathy. Lungs clear. Heart regular rate and rhythm.  Abdomen is soft. Bowel sounds are positive. No hepatomegaly. No abdominal masses felt. No tenderness.  No edema to lower extremities.           Assessment & Plan:  Fatty liver. Am going to get an US abdomen with elast.  CMET, Acute Hepatitis Panel. Bloating: Continue the Gas X. Rx for Omeprazole sent o his pharmacy.

## 2018-12-09 DIAGNOSIS — M545 Low back pain: Secondary | ICD-10-CM | POA: Diagnosis not present

## 2018-12-09 DIAGNOSIS — R030 Elevated blood-pressure reading, without diagnosis of hypertension: Secondary | ICD-10-CM | POA: Diagnosis not present

## 2018-12-09 DIAGNOSIS — M5412 Radiculopathy, cervical region: Secondary | ICD-10-CM | POA: Diagnosis not present

## 2018-12-09 LAB — HEPATITIS PANEL, ACUTE
Hep A IgM: NONREACTIVE
Hep B C IgM: NONREACTIVE
Hepatitis B Surface Ag: NONREACTIVE
Hepatitis C Ab: NONREACTIVE
SIGNAL TO CUT-OFF: 0.05 (ref <1.00)

## 2018-12-09 LAB — COMPREHENSIVE METABOLIC PANEL
AG Ratio: 1.7 (calc) (ref 1.0–2.5)
ALKALINE PHOSPHATASE (APISO): 100 U/L (ref 35–144)
ALT: 12 U/L (ref 9–46)
AST: 17 U/L (ref 10–35)
Albumin: 4.3 g/dL (ref 3.6–5.1)
BUN: 18 mg/dL (ref 7–25)
CO2: 29 mmol/L (ref 20–32)
Calcium: 9.5 mg/dL (ref 8.6–10.3)
Chloride: 103 mmol/L (ref 98–110)
Creat: 0.85 mg/dL (ref 0.70–1.25)
Globulin: 2.6 g/dL (calc) (ref 1.9–3.7)
Glucose, Bld: 86 mg/dL (ref 65–99)
Potassium: 4.7 mmol/L (ref 3.5–5.3)
Sodium: 138 mmol/L (ref 135–146)
Total Bilirubin: 0.7 mg/dL (ref 0.2–1.2)
Total Protein: 6.9 g/dL (ref 6.1–8.1)

## 2018-12-09 LAB — SEDIMENTATION RATE: Sed Rate: 9 mm/h (ref 0–20)

## 2018-12-10 ENCOUNTER — Other Ambulatory Visit (INDEPENDENT_AMBULATORY_CARE_PROVIDER_SITE_OTHER): Payer: Self-pay | Admitting: Internal Medicine

## 2018-12-10 DIAGNOSIS — R140 Abdominal distension (gaseous): Secondary | ICD-10-CM

## 2018-12-10 MED ORDER — OMEPRAZOLE 40 MG PO CPDR: 40.0000 mg | DELAYED_RELEASE_CAPSULE | Freq: Every day | ORAL | 3 refills | Status: DC

## 2018-12-11 ENCOUNTER — Ambulatory Visit (INDEPENDENT_AMBULATORY_CARE_PROVIDER_SITE_OTHER): Payer: PPO

## 2018-12-11 ENCOUNTER — Ambulatory Visit (INDEPENDENT_AMBULATORY_CARE_PROVIDER_SITE_OTHER): Payer: PPO | Admitting: Orthopedic Surgery

## 2018-12-11 ENCOUNTER — Encounter (INDEPENDENT_AMBULATORY_CARE_PROVIDER_SITE_OTHER): Payer: Self-pay | Admitting: Orthopedic Surgery

## 2018-12-11 VITALS — Ht 73.0 in | Wt 170.0 lb

## 2018-12-11 DIAGNOSIS — Z981 Arthrodesis status: Secondary | ICD-10-CM | POA: Diagnosis not present

## 2018-12-11 NOTE — Progress Notes (Signed)
Office Visit Note   Patient: Bradley Espinoza           Date of Birth: 10-Mar-1956           MRN: [id number] Visit Date: 12/11/2018              Requested by: Celene Squibb, MD [place], [place] [address] PCP: Celene Squibb, MD  Chief Complaint  Patient presents with  . Right Ankle - Routine Post Op    11/26/2018 removal tib-cal nail      HPI: Patient is a 63 year old gentleman who presents 2 weeks status post removal of tibial calcaneal fusion nail secondary to a fibrous nonunion with placement of an anterior tibial calcaneal fusion plate.  Patient states he is not smoking at this time.  Patient is rubbing the incision with his hands.  Assessment & Plan: Visit Diagnoses:  1. S/P ankle fusion     Plan: Discussed the importance of elevation he should wash this with soap and water daily apply 4 x 4 gauze and the Ace wrap.  Discussed the importance of decreasing swelling to allow for proper healing.  No narcotics were provided his narcotic score is 471 he just received 100 Percocet 10 tablets on 12/09/2018.  Follow-Up Instructions: Return in about 1 week (around 12/18/2018).   Ortho Exam  Patient is alert, oriented, no adenopathy, well-dressed, normal affect, normal respiratory effort. Examination patient's foot is plantigrade there is swelling there is no wound dehiscence no drainage no cellulitis no signs of infection.  Imaging: Xr Ankle Complete Right  Result Date: 12/11/2018 2 view radiographs of the right ankle shows a stable tibial calcaneal fusion with anterior plate no complications no prominent screws or hardware.  No images are attached to the encounter.  Labs: Lab Results  Component Value Date   HGBA1C 5.1 06/26/2016   ESRSEDRATE 9 12/08/2018   REPTSTATUS 04/10/2017 FINAL 04/05/2017   GRAMSTAIN  04/05/2017    FEW WBC PRESENT, PREDOMINANTLY PMN NO ORGANISMS SEEN    CULT No growth aerobically or anaerobically. 04/05/2017     Lab Results    Component Value Date   ALBUMIN 4.3 08/12/2015   ALBUMIN 4.1 05/06/2015   ALBUMIN 4.0 09/15/2014    Body mass index is 22.43 kg/m.  Orders:  Orders Placed This Encounter  Procedures  . XR Ankle Complete Right   No orders of the defined types were placed in this encounter.    Procedures: No procedures performed  Clinical Data: No additional findings.  ROS:  All other systems negative, except as noted in the HPI. Review of Systems  Objective: Vital Signs: Ht 6\' 1"  (1.854 m)   Wt 170 lb (77.1 kg)   BMI 22.43 kg/m   Specialty Comments:  No specialty comments available.  PMFS History: Patient Active Problem List   Diagnosis Date Noted  . Malunion of joint fusion ([facility]) 11/26/2018  . 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[facility])   . Status post lumbar spinal fusion 09/10/2018  . S/P ankle fusion 11/27/2017  . Nonunion of subtalar arthrodesis   . Avascular necrosis of talus, right ([facility])   . Post-traumatic osteoarthritis, left ankle and foot 10/22/2017  . DDD (degenerative disc disease), lumbar 04/05/2017  . Fracture of L2 vertebra ([facility]) 01/12/2015  . Acute osteomyelitis of calcaneum ([facility]) 10/09/2014  . Dysuria 10/05/2014  . Cellulitis 10/05/2014  . Fall from ladder 09/21/2014  . L2 vertebral fracture ([facility]) 09/21/2014  . Bilateral calcaneal fractures 09/21/2014  . Chronic  pain 09/21/2014  . Acute blood loss anemia 09/21/2014  . Open fracture of both calcanei 09/15/2014   Past Medical History:  Diagnosis Date  . Anxiety   . Arthritis    low back pain, lumbar radiculopathy  . Depression   . Fracture    B/L ankles  . GERD (gastroesophageal reflux disease)   . Headache(784.0)    allergy related   . History of kidney stones   . History of stomach ulcers   . Retained orthopedic hardware    failed retained hardware right foot  . Wears glasses     Family History  Problem Relation Age of Onset  . Diabetes Father   . Cancer Other     Past Surgical History:  Procedure  Laterality Date  . ANKLE FUSION Right 05/11/2015   Procedure: Right Posterior Arthroscopic Subtalar Arthrodesis;  Surgeon: Newt Minion, MD;  Location: [facility];  Service: Orthopedics;  Laterality: Right;  . ANKLE FUSION Right 11/27/2017   Procedure: RIGHT TIBIOCALCANEAL FUSION;  Surgeon: Newt Minion, MD;  Location: [facility];  Service: Orthopedics;  Laterality: Right;  . ANKLE FUSION Right 11/26/2018   Procedure: RIGHT ANTERIOR ANKLE FUSION, APPLY VAC;  Surgeon: Newt Minion, MD;  Location: [facility];  Service: Orthopedics;  Laterality: Right;  . ANTERIOR LAT LUMBAR FUSION N/A 04/05/2017   Procedure: Extreme Lateral Interbody Fusion - Lumbar three-lumbar four ,exploration of fusion Posterior augmentation with globus addition Removal hardware Lumbar one-three. Lumbar four-sacral one,  Removal internal bone growth stimulator;  Surgeon: Kary Kos, MD;  Location: [facility];  Service: Neurosurgery;  Laterality: N/A;  . BACK SURGERY  2004   x 2  . CERVICAL SPINE SURGERY  2008  . ESOPHAGOGASTRODUODENOSCOPY    . HARDWARE REMOVAL Right 10/09/2014   Procedure: Removal Deep Hardware, Irrigation and Debridement Calcaneus, Place Antibiotic Beads and Wound VAC ;  Surgeon: Newt Minion, MD;  Location: [facility];  Service: Orthopedics;  Laterality: Right;  . HARDWARE REMOVAL Right 08/12/2015   Procedure: Removal Deep Hardware Right Foot;  Surgeon: Newt Minion, MD;  Location: [facility];  Service: Orthopedics;  Laterality: Right;  . HARDWARE REMOVAL Right 11/26/2018   Procedure: REMOVAL RIGHT TIBIOCALCANEAL NAIL;  Surgeon: Newt Minion, MD;  Location: [facility];  Service: Orthopedics;  Laterality: Right;  . I&D EXTREMITY Right 09/15/2014   Procedure: IRRIGATION AND DEBRIDEMENT Ankle;  Surgeon: Renette Butters, MD;  Location: [facility];  Service: Orthopedics;  Laterality: Right;  . INGUINAL HERNIA REPAIR Bilateral   . LAMINECTOMY WITH POSTERIOR LATERAL ARTHRODESIS LEVEL 4 N/A 09/10/2018   Procedure: Posterior Lateral Fusion - Thoracic  Eleven-Thoracic Twelve - Thoracic Twelve-Lumbar One - Lumbar One-Lumbar Two - Lumbar Two-Lumbar Three with instrumentaion and PLA;  Surgeon: Kary Kos, MD;  Location: [facility];  Service: Neurosurgery;  Laterality: N/A;  Posterior Lateral Fusion - Thoracic Eleven-Thoracic Twelve - Thoracic Twelve-Lumbar One - Lumbar One-Lumbar Two - Lumbar Two-Lumbar Thre  . LUMBAR PERCUTANEOUS PEDICLE SCREW 1 LEVEL N/A 04/05/2017   Procedure: LUMBAR PERCUTANEOUS PEDICLE SCREW LUMBAR THREE-FOUR;  Surgeon: Kary Kos, MD;  Location: [facility];  Service: Neurosurgery;  Laterality: N/A;  . ORIF CALCANEOUS FRACTURE Right 09/19/2014   Procedure: OPEN REDUCTION INTERNAL FIXATION (ORIF) CALCANEOUS FRACTURE;  Surgeon: Newt Minion, MD;  Location: [facility];  Service: Orthopedics;  Laterality: Right;  . ORIF CALCANEOUS FRACTURE Left 09/19/2014   Procedure: OPEN REDUCTION INTERNAL FIXATION (ORIF) CALCANEOUS FRACTURE;  Surgeon: Newt Minion, MD;  Location: [facility];  Service: Orthopedics;  Laterality: Left;   Social History   Occupational History    Comment: disabled  Tobacco Use  . Smoking status: Current Some Day Smoker    Packs/day: 1.00    Years: 10.00    Pack years: 10.00    Types: Cigars    Start date: 10/08/1976    Last attempt to quit: 09/07/2014    Years since quitting: 4.2  . Smokeless tobacco: Never Used  . Tobacco comment: 06/26/16 reports smokes a cigar 2 times a week at the most  Substance and Sexual Activity  . Alcohol use: No    Comment: 06/26/16 quit 1998  . Drug use: No  . Sexual activity: Yes    Birth control/protection: None

## 2018-12-15 ENCOUNTER — Ambulatory Visit (HOSPITAL_COMMUNITY)
Admission: RE | Admit: 2018-12-15 | Discharge: 2018-12-15 | Disposition: A | Discharge status: Home / Self Care | Payer: PPO | Source: Ambulatory Visit | Attending: Internal Medicine | Admitting: Internal Medicine

## 2018-12-15 DIAGNOSIS — R161 Splenomegaly, not elsewhere classified: Secondary | ICD-10-CM | POA: Diagnosis not present

## 2018-12-15 DIAGNOSIS — K8020 Calculus of gallbladder without cholecystitis without obstruction: Secondary | ICD-10-CM | POA: Diagnosis not present

## 2018-12-15 DIAGNOSIS — E782 Mixed hyperlipidemia: Secondary | ICD-10-CM | POA: Diagnosis not present

## 2018-12-15 DIAGNOSIS — Z6823 Body mass index (BMI) 23.0-23.9, adult: Secondary | ICD-10-CM | POA: Diagnosis not present

## 2018-12-15 DIAGNOSIS — E291 Testicular hypofunction: Secondary | ICD-10-CM | POA: Diagnosis not present

## 2018-12-15 DIAGNOSIS — K760 Fatty (change of) liver, not elsewhere classified: Secondary | ICD-10-CM | POA: Diagnosis not present

## 2018-12-15 DIAGNOSIS — N400 Enlarged prostate without lower urinary tract symptoms: Secondary | ICD-10-CM | POA: Diagnosis not present

## 2018-12-17 ENCOUNTER — Telehealth (INDEPENDENT_AMBULATORY_CARE_PROVIDER_SITE_OTHER): Payer: Self-pay | Admitting: Family

## 2018-12-17 ENCOUNTER — Telehealth (INDEPENDENT_AMBULATORY_CARE_PROVIDER_SITE_OTHER): Payer: Self-pay

## 2018-12-17 ENCOUNTER — Encounter (INDEPENDENT_AMBULATORY_CARE_PROVIDER_SITE_OTHER): Payer: Self-pay | Admitting: Family

## 2018-12-17 ENCOUNTER — Ambulatory Visit (INDEPENDENT_AMBULATORY_CARE_PROVIDER_SITE_OTHER): Payer: PPO | Admitting: Family

## 2018-12-17 VITALS — BP 144/77 | HR 83 | Temp 100.2°F | Ht 73.0 in | Wt 170.0 lb

## 2018-12-17 DIAGNOSIS — T84498D Other mechanical complication of other internal orthopedic devices, implants and grafts, subsequent encounter: Secondary | ICD-10-CM

## 2018-12-17 DIAGNOSIS — L03115 Cellulitis of right lower limb: Secondary | ICD-10-CM

## 2018-12-17 MED ORDER — OXYCODONE-ACETAMINOPHEN 10-325 MG PO TABS: 1.0000 | ORAL_TABLET | Freq: Four times a day (QID) | ORAL | 0 refills | Status: DC | PRN

## 2018-12-17 MED ORDER — DOXYCYCLINE HYCLATE 100 MG PO TABS: 100.0000 mg | ORAL_TABLET | Freq: Two times a day (BID) | ORAL | 0 refills | Status: DC

## 2018-12-17 NOTE — Telephone Encounter (Signed)
Bradley Espinoza from [facility] in [place] called stating that the RX for the Percocet 10-325mg  went to the wrong pharmacy, it should have gone to [facility] in [place].  Their CB#[phone], Fax [phone].  Thank you.

## 2018-12-17 NOTE — Telephone Encounter (Signed)
Pharmacy was called and Rx re-faxed by Junie Panning

## 2018-12-17 NOTE — Addendum Note (Signed)
Addended by: Dondra Prader R on: 12/17/2018 03:49 PM   Modules accepted: Orders

## 2018-12-17 NOTE — Progress Notes (Signed)
Post-Op Visit Note   Patient: Bradley Espinoza           Date of Birth: 01-07-1956           MRN: [id number] Visit Date: 12/17/2018 PCP: Celene Squibb, MD  Chief Complaint:  Chief Complaint  Patient presents with  . Right Ankle - Routine Post Op    11/26/2018 removal hardware    HPI:  HPI The patient is a 63 year old gentleman who presents today status post right ankle hardware removal and wound VAC placement on February 19 of this year.  Complaining of swelling redness and warmth.  Complains that he had a fever with chills overnight did not have a thermometer.  Has been wearing a fracture boot using crutches for ambulation.  Taking 10 mg of oxycodone every 4 hours for pain.  Ortho Exam On examination the plantar and lateral incisions are well-healed.  There is moderate edema to the ankle.  Erythema over the dorsal incision, this is healing well.  Warm erythema surrounding however there is no ascending cellulitis.  There is some serous drainage from 3 suture portals.  There is no wound dehiscence.  No foul odor.  Visit Diagnoses:  1. Cellulitis of right lower extremity   2. Arthrodesis malunion, subsequent encounter     Plan: We will start the patient on doxycycline today.  Discussed return precautions.  If patient continues with fevers or any worsening of his cellulitis or drainage she will call.  Discussed possibility of admission for IV antibiotics if this does not resolve with orals.  He will follow-up in the office on Monday.  Follow-Up Instructions: Return in about 1 week (around 12/24/2018).   Imaging: No results found.  Orders:  No orders of the defined types were placed in this encounter.  Meds ordered this encounter  Medications  . oxyCODONE-acetaminophen (PERCOCET) 10-325 MG tablet    Sig: Take 1 tablet by mouth every 6 (six) hours as needed for pain.    Dispense:  30 tablet    Refill:  0  . doxycycline (VIBRA-TABS) 100 MG tablet    Sig: Take 1 tablet (100 mg  total) by mouth 2 (two) times daily.    Dispense:  60 tablet    Refill:  0     PMFS History: Patient Active Problem List   Diagnosis Date Noted  . Malunion of joint fusion ([facility]) 11/26/2018  . 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[facility])   . Status post lumbar spinal fusion 09/10/2018  . S/P ankle fusion 11/27/2017  . Nonunion of subtalar arthrodesis   . Avascular necrosis of talus, right ([place] [facility])   . Post-traumatic osteoarthritis, left ankle and foot 10/22/2017  . DDD (degenerative disc disease), lumbar 04/05/2017  . Fracture of L2 vertebra ([facility]) 01/12/2015  . Acute osteomyelitis of calcaneum ([place]) 10/09/2014  . Dysuria 10/05/2014  . Cellulitis 10/05/2014  . Fall from ladder 09/21/2014  . L2 vertebral fracture ([facility]) 09/21/2014  . Bilateral calcaneal fractures 09/21/2014  . Chronic pain 09/21/2014  . Acute blood loss anemia 09/21/2014  . Open fracture of both calcanei 09/15/2014   Past Medical History:  Diagnosis Date  . Anxiety   . Arthritis    low back pain, lumbar radiculopathy  . Depression   . Fracture    B/L ankles  . GERD (gastroesophageal reflux disease)   . Headache(784.0)    allergy related   . History of kidney stones   . History of stomach ulcers   . Retained orthopedic hardware  failed retained hardware right foot  . Wears glasses     Family History  Problem Relation Age of Onset  . Diabetes Father   . Cancer Other     Past Surgical History:  Procedure Laterality Date  . ANKLE FUSION Right 05/11/2015   Procedure: Right Posterior Arthroscopic Subtalar Arthrodesis;  Surgeon: Newt Minion, MD;  Location: [facility];  Service: Orthopedics;  Laterality: Right;  . ANKLE FUSION Right 11/27/2017   Procedure: RIGHT TIBIOCALCANEAL FUSION;  Surgeon: Newt Minion, MD;  Location: [facility];  Service: Orthopedics;  Laterality: Right;  . ANKLE FUSION Right 11/26/2018   Procedure: RIGHT ANTERIOR ANKLE FUSION, APPLY VAC;  Surgeon: Newt Minion, MD;  Location: [facility];  Service: Orthopedics;   Laterality: Right;  . ANTERIOR LAT LUMBAR FUSION N/A 04/05/2017   Procedure: Extreme Lateral Interbody Fusion - Lumbar three-lumbar four ,exploration of fusion Posterior augmentation with globus addition Removal hardware Lumbar one-three. Lumbar four-sacral one,  Removal internal bone growth stimulator;  Surgeon: Kary Kos, MD;  Location: [facility];  Service: Neurosurgery;  Laterality: N/A;  . BACK SURGERY  2004   x 2  . CERVICAL SPINE SURGERY  2008  . ESOPHAGOGASTRODUODENOSCOPY    . HARDWARE REMOVAL Right 10/09/2014   Procedure: Removal Deep Hardware, Irrigation and Debridement Calcaneus, Place Antibiotic Beads and Wound VAC ;  Surgeon: Newt Minion, MD;  Location: [facility];  Service: Orthopedics;  Laterality: Right;  . HARDWARE REMOVAL Right 08/12/2015   Procedure: Removal Deep Hardware Right Foot;  Surgeon: Newt Minion, MD;  Location: [facility];  Service: Orthopedics;  Laterality: Right;  . HARDWARE REMOVAL Right 11/26/2018   Procedure: REMOVAL RIGHT TIBIOCALCANEAL NAIL;  Surgeon: Newt Minion, MD;  Location: [facility];  Service: Orthopedics;  Laterality: Right;  . I&D EXTREMITY Right 09/15/2014   Procedure: IRRIGATION AND DEBRIDEMENT Ankle;  Surgeon: Renette Butters, MD;  Location: [facility];  Service: Orthopedics;  Laterality: Right;  . INGUINAL HERNIA REPAIR Bilateral   . LAMINECTOMY WITH POSTERIOR LATERAL ARTHRODESIS LEVEL 4 N/A 09/10/2018   Procedure: Posterior Lateral Fusion - Thoracic Eleven-Thoracic Twelve - Thoracic Twelve-Lumbar One - Lumbar One-Lumbar Two - Lumbar Two-Lumbar Three with instrumentaion and PLA;  Surgeon: Kary Kos, MD;  Location: [facility];  Service: Neurosurgery;  Laterality: N/A;  Posterior Lateral Fusion - Thoracic Eleven-Thoracic Twelve - Thoracic Twelve-Lumbar One - Lumbar One-Lumbar Two - Lumbar Two-Lumbar Thre  . LUMBAR PERCUTANEOUS PEDICLE SCREW 1 LEVEL N/A 04/05/2017   Procedure: LUMBAR PERCUTANEOUS PEDICLE SCREW LUMBAR THREE-FOUR;  Surgeon: Kary Kos, MD;  Location: [facility];   Service: Neurosurgery;  Laterality: N/A;  . ORIF CALCANEOUS FRACTURE Right 09/19/2014   Procedure: OPEN REDUCTION INTERNAL FIXATION (ORIF) CALCANEOUS FRACTURE;  Surgeon: Newt Minion, MD;  Location: [facility];  Service: Orthopedics;  Laterality: Right;  . ORIF CALCANEOUS FRACTURE Left 09/19/2014   Procedure: OPEN REDUCTION INTERNAL FIXATION (ORIF) CALCANEOUS FRACTURE;  Surgeon: Newt Minion, MD;  Location: [facility];  Service: Orthopedics;  Laterality: Left;   Social History   Occupational History    Comment: disabled  Tobacco Use  . Smoking status: Current Some Day Smoker    Packs/day: 1.00    Years: 10.00    Pack years: 10.00    Types: Cigars    Start date: 10/08/1976    Last attempt to quit: 09/07/2014    Years since quitting: 4.2  . Smokeless tobacco: Never Used  . Tobacco comment: 06/26/16 reports smokes a cigar 2 times a week  at the most  Substance and Sexual Activity  . Alcohol use: No    Comment: 06/26/16 quit 1998  . Drug use: No  . Sexual activity: Yes    Birth control/protection: None

## 2018-12-17 NOTE — Telephone Encounter (Signed)
I spoke to pharmacy(Nancy) and Junie Panning will re-fax another Rx for patient at [facility]. We asked them to shred the other Rx for oxycodone.

## 2018-12-18 ENCOUNTER — Ambulatory Visit (INDEPENDENT_AMBULATORY_CARE_PROVIDER_SITE_OTHER): Payer: PPO | Admitting: Orthopedic Surgery

## 2018-12-19 ENCOUNTER — Ambulatory Visit (INDEPENDENT_AMBULATORY_CARE_PROVIDER_SITE_OTHER): Payer: Self-pay | Admitting: Physician Assistant

## 2018-12-19 ENCOUNTER — Ambulatory Visit (INDEPENDENT_AMBULATORY_CARE_PROVIDER_SITE_OTHER): Payer: PPO | Admitting: Physician Assistant

## 2018-12-19 ENCOUNTER — Encounter (HOSPITAL_COMMUNITY): Payer: Self-pay

## 2018-12-19 ENCOUNTER — Encounter (INDEPENDENT_AMBULATORY_CARE_PROVIDER_SITE_OTHER): Payer: Self-pay | Admitting: Physician Assistant

## 2018-12-19 ENCOUNTER — Inpatient Hospital Stay (HOSPITAL_COMMUNITY)
Admission: AD | Admit: 2018-12-19 | Discharge: 2018-12-25 | DRG: 493 | Disposition: A | Discharge status: Home / Self Care | Payer: PPO | Attending: Orthopedic Surgery | Admitting: Orthopedic Surgery

## 2018-12-19 ENCOUNTER — Telehealth (INDEPENDENT_AMBULATORY_CARE_PROVIDER_SITE_OTHER): Payer: Self-pay

## 2018-12-19 ENCOUNTER — Other Ambulatory Visit: Payer: Self-pay

## 2018-12-19 VITALS — BP 151/88 | HR 83 | Temp 97.5°F | Ht 73.0 in | Wt 170.0 lb

## 2018-12-19 DIAGNOSIS — F17290 Nicotine dependence, other tobacco product, uncomplicated: Secondary | ICD-10-CM | POA: Diagnosis present

## 2018-12-19 DIAGNOSIS — B9561 Methicillin susceptible Staphylococcus aureus infection as the cause of diseases classified elsewhere: Secondary | ICD-10-CM | POA: Diagnosis not present

## 2018-12-19 DIAGNOSIS — T8149XA Infection following a procedure, other surgical site, initial encounter: Secondary | ICD-10-CM

## 2018-12-19 DIAGNOSIS — Z981 Arthrodesis status: Secondary | ICD-10-CM

## 2018-12-19 DIAGNOSIS — G8918 Other acute postprocedural pain: Secondary | ICD-10-CM | POA: Diagnosis not present

## 2018-12-19 DIAGNOSIS — Z87442 Personal history of urinary calculi: Secondary | ICD-10-CM

## 2018-12-19 DIAGNOSIS — Y831 Surgical operation with implant of artificial internal device as the cause of abnormal reaction of the patient, or of later complication, without mention of misadventure at the time of the procedure: Secondary | ICD-10-CM | POA: Diagnosis present

## 2018-12-19 DIAGNOSIS — Z8711 Personal history of peptic ulcer disease: Secondary | ICD-10-CM

## 2018-12-19 DIAGNOSIS — Z8781 Personal history of (healed) traumatic fracture: Secondary | ICD-10-CM | POA: Diagnosis not present

## 2018-12-19 DIAGNOSIS — F419 Anxiety disorder, unspecified: Secondary | ICD-10-CM | POA: Diagnosis present

## 2018-12-19 DIAGNOSIS — Z472 Encounter for removal of internal fixation device: Secondary | ICD-10-CM | POA: Diagnosis not present

## 2018-12-19 DIAGNOSIS — T8459XA Infection and inflammatory reaction due to other internal joint prosthesis, initial encounter: Secondary | ICD-10-CM | POA: Diagnosis not present

## 2018-12-19 DIAGNOSIS — M5416 Radiculopathy, lumbar region: Secondary | ICD-10-CM | POA: Diagnosis not present

## 2018-12-19 DIAGNOSIS — K219 Gastro-esophageal reflux disease without esophagitis: Secondary | ICD-10-CM | POA: Diagnosis present

## 2018-12-19 DIAGNOSIS — Z720 Tobacco use: Secondary | ICD-10-CM | POA: Diagnosis not present

## 2018-12-19 DIAGNOSIS — T8469XA Infection and inflammatory reaction due to internal fixation device of other site, initial encounter: Secondary | ICD-10-CM | POA: Diagnosis not present

## 2018-12-19 DIAGNOSIS — A4901 Methicillin susceptible Staphylococcus aureus infection, unspecified site: Secondary | ICD-10-CM | POA: Diagnosis present

## 2018-12-19 DIAGNOSIS — M960 Pseudarthrosis after fusion or arthrodesis: Secondary | ICD-10-CM | POA: Diagnosis not present

## 2018-12-19 DIAGNOSIS — Z885 Allergy status to narcotic agent status: Secondary | ICD-10-CM

## 2018-12-19 DIAGNOSIS — T84629A Infection and inflammatory reaction due to internal fixation device of unspecified bone of leg, initial encounter: Secondary | ICD-10-CM | POA: Diagnosis present

## 2018-12-19 DIAGNOSIS — F329 Major depressive disorder, single episode, unspecified: Secondary | ICD-10-CM | POA: Diagnosis not present

## 2018-12-19 DIAGNOSIS — R110 Nausea: Secondary | ICD-10-CM | POA: Diagnosis not present

## 2018-12-19 LAB — BASIC METABOLIC PANEL
Anion gap: 10 (ref 5–15)
BUN: 14 mg/dL (ref 8–23)
CO2: 25 mmol/L (ref 22–32)
Calcium: 8.6 mg/dL — ABNORMAL LOW (ref 8.9–10.3)
Chloride: 102 mmol/L (ref 98–111)
Creatinine, Ser: 0.85 mg/dL (ref 0.61–1.24)
GFR calc Af Amer: >60 mL/min (ref >60)
GFR calc non Af Amer: >60 mL/min (ref >60)
Glucose, Bld: 118 mg/dL — ABNORMAL HIGH (ref 70–99)
Potassium: 3.2 mmol/L — ABNORMAL LOW (ref 3.5–5.1)
Sodium: 137 mmol/L (ref 135–145)

## 2018-12-19 LAB — CBC
HCT: 36.5 % — ABNORMAL LOW (ref 39.0–52.0)
Hemoglobin: 11.8 g/dL — ABNORMAL LOW (ref 13.0–17.0)
MCH: 29.3 pg (ref 26.0–34.0)
MCHC: 32.3 g/dL (ref 30.0–36.0)
MCV: 90.6 fL (ref 80.0–100.0)
PLATELETS: 214 10*3/uL (ref 150–400)
RBC: 4.03 MIL/uL — ABNORMAL LOW (ref 4.22–5.81)
RDW: 12.2 % (ref 11.5–15.5)
WBC: 6.9 10*3/uL (ref 4.0–10.5)
nRBC: 0 % (ref 0.0–0.2)

## 2018-12-19 MED ORDER — DOCUSATE SODIUM 100 MG PO CAPS
100.0000 mg | ORAL_CAPSULE | Freq: Two times a day (BID) | ORAL | Status: DC
  Administered 2018-12-19 – 2018-12-21 (×4): 100 mg via ORAL
  Filled 2018-12-19 (×7): qty 1

## 2018-12-19 MED ORDER — GABAPENTIN 300 MG PO CAPS
600.0000 mg | ORAL_CAPSULE | Freq: Every day | ORAL | Status: DC
  Administered 2018-12-19 – 2018-12-24 (×7): 600 mg via ORAL
  Filled 2018-12-19 (×7): qty 2

## 2018-12-19 MED ORDER — ONDANSETRON HCL 4 MG PO TABS: 4.0000 mg | ORAL_TABLET | Freq: Four times a day (QID) | ORAL | Status: DC | PRN

## 2018-12-19 MED ORDER — VANCOMYCIN HCL 10 G IV SOLR
1500.0000 mg | Freq: Once | INTRAVENOUS | Status: AC
  Administered 2018-12-19: 1500 mg via INTRAVENOUS
  Filled 2018-12-19: qty 1500

## 2018-12-19 MED ORDER — ACETAMINOPHEN 650 MG RE SUPP: 650.0000 mg | Freq: Four times a day (QID) | RECTAL | Status: DC | PRN

## 2018-12-19 MED ORDER — HYDROMORPHONE HCL 1 MG/ML IJ SOLN: 0.5000 mg | INTRAMUSCULAR | Status: DC | PRN

## 2018-12-19 MED ORDER — DIPHENHYDRAMINE HCL 12.5 MG/5ML PO ELIX: 12.5000 mg | ORAL_SOLUTION | ORAL | Status: DC | PRN

## 2018-12-19 MED ORDER — ASPIRIN 325 MG PO TABS
325.0000 mg | ORAL_TABLET | Freq: Every day | ORAL | Status: DC
  Administered 2018-12-19 – 2018-12-25 (×6): 325 mg via ORAL
  Filled 2018-12-19 (×7): qty 1

## 2018-12-19 MED ORDER — OXYCODONE HCL 5 MG PO TABS
5.0000 mg | ORAL_TABLET | ORAL | Status: DC | PRN
  Administered 2018-12-19 – 2018-12-20 (×4): 10 mg via ORAL
  Filled 2018-12-19 (×4): qty 2

## 2018-12-19 MED ORDER — METHOCARBAMOL 500 MG IVPB - SIMPLE MED: 500.0000 mg | Freq: Four times a day (QID) | INTRAVENOUS | Status: DC | PRN

## 2018-12-19 MED ORDER — HYDROMORPHONE HCL 1 MG/ML IJ SOLN
0.5000 mg | INTRAMUSCULAR | Status: DC | PRN
  Administered 2018-12-20 – 2018-12-22 (×12): 0.5 mg via INTRAVENOUS
  Filled 2018-12-19 (×12): qty 1

## 2018-12-19 MED ORDER — ACETAMINOPHEN 325 MG PO TABS
650.0000 mg | ORAL_TABLET | Freq: Four times a day (QID) | ORAL | Status: DC | PRN
  Administered 2018-12-19 – 2018-12-21 (×5): 650 mg via ORAL
  Filled 2018-12-19 (×6): qty 2

## 2018-12-19 MED ORDER — GABAPENTIN 300 MG PO CAPS
300.0000 mg | ORAL_CAPSULE | Freq: Two times a day (BID) | ORAL | Status: DC
  Administered 2018-12-19 – 2018-12-25 (×12): 300 mg via ORAL
  Filled 2018-12-19 (×11): qty 1

## 2018-12-19 MED ORDER — SODIUM CHLORIDE 0.45 % IV SOLN
INTRAVENOUS | Status: DC
  Administered 2018-12-19 – 2018-12-23 (×4): via INTRAVENOUS

## 2018-12-19 MED ORDER — TESTOSTERONE CYPIONATE 200 MG/ML IM SOLN
200.0000 mg | INTRAMUSCULAR | Status: DC
  Filled 2018-12-19: qty 1

## 2018-12-19 MED ORDER — ZOLPIDEM TARTRATE 5 MG PO TABS
5.0000 mg | ORAL_TABLET | Freq: Every evening | ORAL | Status: DC | PRN
  Administered 2018-12-20 – 2018-12-23 (×5): 5 mg via ORAL
  Filled 2018-12-19 (×5): qty 1

## 2018-12-19 MED ORDER — SORBITOL 70 % SOLN: 30.0000 mL | Freq: Every day | Status: AC | PRN

## 2018-12-19 MED ORDER — TAMSULOSIN HCL 0.4 MG PO CAPS
0.4000 mg | ORAL_CAPSULE | Freq: Every day | ORAL | Status: DC
  Administered 2018-12-20 – 2018-12-25 (×4): 0.4 mg via ORAL
  Filled 2018-12-19 (×6): qty 1

## 2018-12-19 MED ORDER — PIPERACILLIN-TAZOBACTAM 3.375 G IVPB
3.3750 g | Freq: Three times a day (TID) | INTRAVENOUS | Status: DC
  Administered 2018-12-19 – 2018-12-23 (×12): 3.375 g via INTRAVENOUS
  Filled 2018-12-19 (×13): qty 50

## 2018-12-19 MED ORDER — OXYCODONE HCL 5 MG PO TABS: 5.0000 mg | ORAL_TABLET | ORAL | Status: AC | PRN

## 2018-12-19 MED ORDER — ACETAMINOPHEN 325 MG PO TABS: 650.0000 mg | ORAL_TABLET | Freq: Four times a day (QID) | ORAL | Status: DC | PRN

## 2018-12-19 MED ORDER — ONDANSETRON HCL 4 MG/2ML IJ SOLN: 4.0000 mg | Freq: Four times a day (QID) | INTRAMUSCULAR | Status: DC | PRN

## 2018-12-19 MED ORDER — GABAPENTIN 300 MG PO CAPS: 300.0000 mg | ORAL_CAPSULE | ORAL | Status: DC

## 2018-12-19 MED ORDER — METHOCARBAMOL 1000 MG/10ML IJ SOLN
500.0000 mg | Freq: Four times a day (QID) | INTRAVENOUS | Status: DC | PRN
  Filled 2018-12-19: qty 5

## 2018-12-19 MED ORDER — MAGNESIUM CITRATE PO SOLN: 1.0000 | Freq: Once | ORAL | Status: DC | PRN

## 2018-12-19 MED ORDER — DIPHENHYDRAMINE HCL 12.5 MG/5ML PO ELIX
12.5000 mg | ORAL_SOLUTION | ORAL | Status: DC | PRN
  Administered 2018-12-20: 25 mg via ORAL
  Filled 2018-12-19: qty 10

## 2018-12-19 MED ORDER — ASPIRIN 325 MG PO TABS: 325.0000 mg | ORAL_TABLET | Freq: Every day | ORAL | Status: DC

## 2018-12-19 MED ORDER — PANTOPRAZOLE SODIUM 40 MG PO TBEC
40.0000 mg | DELAYED_RELEASE_TABLET | Freq: Every day | ORAL | Status: DC
  Administered 2018-12-19 – 2018-12-25 (×6): 40 mg via ORAL
  Filled 2018-12-19 (×6): qty 1

## 2018-12-19 MED ORDER — METHOCARBAMOL 500 MG PO TABS
500.0000 mg | ORAL_TABLET | Freq: Four times a day (QID) | ORAL | Status: DC | PRN
  Administered 2018-12-19 – 2018-12-22 (×9): 500 mg via ORAL
  Filled 2018-12-19 (×9): qty 1

## 2018-12-19 MED ORDER — SENNA 8.6 MG PO TABS: 1.0000 | ORAL_TABLET | Freq: Two times a day (BID) | ORAL | Status: AC

## 2018-12-19 MED ORDER — METHOCARBAMOL 500 MG PO TABS: 500.0000 mg | ORAL_TABLET | Freq: Four times a day (QID) | ORAL | Status: DC | PRN

## 2018-12-19 MED ORDER — VANCOMYCIN HCL 10 G IV SOLR
1250.0000 mg | Freq: Two times a day (BID) | INTRAVENOUS | Status: DC
  Administered 2018-12-20 – 2018-12-25 (×11): 1250 mg via INTRAVENOUS
  Filled 2018-12-19 (×12): qty 1250

## 2018-12-19 MED ORDER — DOCUSATE SODIUM 100 MG PO CAPS: 100.0000 mg | ORAL_CAPSULE | Freq: Two times a day (BID) | ORAL | Status: AC

## 2018-12-19 MED ORDER — SODIUM CHLORIDE 0.45 % IV SOLN: INTRAVENOUS | Status: AC

## 2018-12-19 MED ORDER — SENNA 8.6 MG PO TABS
1.0000 | ORAL_TABLET | Freq: Two times a day (BID) | ORAL | Status: DC
  Administered 2018-12-21 – 2018-12-24 (×2): 8.6 mg via ORAL
  Filled 2018-12-19 (×10): qty 1

## 2018-12-19 NOTE — H&P (Signed)
Bradley Espinoza is an 63 y.o. male.   Chief Complaint: Wound infection right ankle HPI: The patient is a 63 year old gentleman who is seen for postoperative follow-up following revision of his right ankle fusion.  He underwent removal of his right tibiocalcaneal nail and right anterior tibiotalar calcaneal fusion with VAC placement on 11/26/2018.  He developed worsening erythema swelling and pain over the right ankle earlier in the week and was started on oral doxycycline but he has continued to have worsening of his swelling redness and discomfort and presented to the office today for evaluation.  He is being admitted for intravenous antibiotic therapy.  Past Medical History:  Diagnosis Date  . Anxiety   . Arthritis    low back pain, lumbar radiculopathy  . Depression   . Fracture    B/L ankles  . GERD (gastroesophageal reflux disease)   . Headache(784.0)    allergy related   . History of kidney stones   . History of stomach ulcers   . Retained orthopedic hardware    failed retained hardware right foot  . Wears glasses     Past Surgical History:  Procedure Laterality Date  . ANKLE FUSION Right 05/11/2015   Procedure: Right Posterior Arthroscopic Subtalar Arthrodesis;  Surgeon: Newt Minion, MD;  Location: [facility];  Service: Orthopedics;  Laterality: Right;  . ANKLE FUSION Right 11/27/2017   Procedure: RIGHT TIBIOCALCANEAL FUSION;  Surgeon: Newt Minion, MD;  Location: [facility];  Service: Orthopedics;  Laterality: Right;  . ANKLE FUSION Right 11/26/2018   Procedure: RIGHT ANTERIOR ANKLE FUSION, APPLY VAC;  Surgeon: Newt Minion, MD;  Location: [facility];  Service: Orthopedics;  Laterality: Right;  . ANTERIOR LAT LUMBAR FUSION N/A 04/05/2017   Procedure: Extreme Lateral Interbody Fusion - Lumbar three-lumbar four ,exploration of fusion Posterior augmentation with globus addition Removal hardware Lumbar one-three. Lumbar four-sacral one,  Removal internal bone growth stimulator;  Surgeon:  Kary Kos, MD;  Location: [facility];  Service: Neurosurgery;  Laterality: N/A;  . BACK SURGERY  2004   x 2  . CERVICAL SPINE SURGERY  2008  . ESOPHAGOGASTRODUODENOSCOPY    . HARDWARE REMOVAL Right 10/09/2014   Procedure: Removal Deep Hardware, Irrigation and Debridement Calcaneus, Place Antibiotic Beads and Wound VAC ;  Surgeon: Newt Minion, MD;  Location: [facility];  Service: Orthopedics;  Laterality: Right;  . HARDWARE REMOVAL Right 08/12/2015   Procedure: Removal Deep Hardware Right Foot;  Surgeon: Newt Minion, MD;  Location: [facility];  Service: Orthopedics;  Laterality: Right;  . HARDWARE REMOVAL Right 11/26/2018   Procedure: REMOVAL RIGHT TIBIOCALCANEAL NAIL;  Surgeon: Newt Minion, MD;  Location: [facility];  Service: Orthopedics;  Laterality: Right;  . I&D EXTREMITY Right 09/15/2014   Procedure: IRRIGATION AND DEBRIDEMENT Ankle;  Surgeon: Renette Butters, MD;  Location: [facility];  Service: Orthopedics;  Laterality: Right;  . INGUINAL HERNIA REPAIR Bilateral   . LAMINECTOMY WITH POSTERIOR LATERAL ARTHRODESIS LEVEL 4 N/A 09/10/2018   Procedure: Posterior Lateral Fusion - Thoracic Eleven-Thoracic Twelve - Thoracic Twelve-Lumbar One - Lumbar One-Lumbar Two - Lumbar Two-Lumbar Three with instrumentaion and PLA;  Surgeon: Kary Kos, MD;  Location: [facility];  Service: Neurosurgery;  Laterality: N/A;  Posterior Lateral Fusion - Thoracic Eleven-Thoracic Twelve - Thoracic Twelve-Lumbar One - Lumbar One-Lumbar Two - Lumbar Two-Lumbar Thre  . LUMBAR PERCUTANEOUS PEDICLE SCREW 1 LEVEL N/A 04/05/2017   Procedure: LUMBAR PERCUTANEOUS PEDICLE SCREW LUMBAR THREE-FOUR;  Surgeon: Kary Kos, MD;  Location: [facility];  Service: Neurosurgery;  Laterality: N/A;  . ORIF CALCANEOUS FRACTURE Right 09/19/2014   Procedure: OPEN REDUCTION INTERNAL FIXATION (ORIF) CALCANEOUS FRACTURE;  Surgeon: Newt Minion, MD;  Location: [facility];  Service: Orthopedics;  Laterality: Right;  . ORIF CALCANEOUS FRACTURE Left 09/19/2014   Procedure: OPEN  REDUCTION INTERNAL FIXATION (ORIF) CALCANEOUS FRACTURE;  Surgeon: Newt Minion, MD;  Location: [facility];  Service: Orthopedics;  Laterality: Left;    Family History  Problem Relation Age of Onset  . Diabetes Father   . Cancer Other    Social History:  reports that he has been smoking cigars. He started smoking about 42 years ago. He has a 10.00 pack-year smoking history. He has never used smokeless tobacco. He reports that he does not drink alcohol or use drugs.  Allergies:  Allergies  Allergen Reactions  . Codeine Nausea And Vomiting    No medications prior to admission.    No results found for this or any previous visit (from the past 48 hour(s)). No results found.  Review of Systems  Constitutional: Positive for chills, fever and malaise/fatigue.  Musculoskeletal: Positive for back pain, joint pain and myalgias.    There were no vitals taken for this visit. Physical Exam  Constitutional: He is oriented to person, place, and time. He appears well-developed and well-nourished. He appears distressed (Complains of pain about the right ankle).  HENT:  Head: Normocephalic and atraumatic.  Neck: No tracheal deviation present. No thyromegaly present.  Cardiovascular: Normal rate, regular rhythm, normal heart sounds and intact distal pulses.  Respiratory: Effort normal. No stridor. No respiratory distress.  GI: Soft. He exhibits no distension.  Musculoskeletal:     Comments: The right anterior ankle incision is erythematous and the central incision has dehisced with some slight serosanguineous appearing drainage.  There is erythema localized to the peri-incisional area as well as extending in the proximal third of the foot and the distal third of the ankle.  There is severe localized edema.  He is very tender to palpation.  He has palpable pulses.  Neurological: He is alert and oriented to person, place, and time.  Skin: Skin is warm. There is erythema.  Psychiatric: He has a normal  mood and affect. His behavior is normal. Judgment and thought content normal.     Assessment/Plan Wound infection status post revision right ankle fusion-admit for intravenous antibiotic therapy including IV Zosyn and vancomycin.  Will need at least several days of antibiotic therapy.  Erlinda Hong, PA-C 12/19/2018, 4:38 PM [facility] [phone]

## 2018-12-19 NOTE — Progress Notes (Signed)
Pharmacy Antibiotic Note  Bradley Espinoza is a 63 y.o. male admitted on 12/19/2018 with wound infection.  Pharmacy has been consulted for Vancomycin and Zosyn dosing.  S/p right ankle surgery 11/26/18. Oral doxycycline begun after office visit on 12/17/18, but worsening of swelling and redness and discomfort. Admitted for IV antibiotics. Afebrile, WBC 6.9.  Plan:  Zosyn 3.375 gm IV q8hrs (each over 4 hours)  Vancomycin 1500 mg IV x 1, then 1250 mg IV q12hrs.  Goal AUC 400-550.  Expected AUC: 513  SCr used: 0.85   Temp (24hrs), Avg:98.1 F (36.7 C), Min:97.5 F (36.4 C), Max:98.6 F (37 C)  Recent Labs  Lab 12/19/18 1834  WBC 6.9  CREATININE 0.85    Estimated Creatinine Clearance: 97 mL/min (by C-G formula based on SCr of 0.85 mg/dL).    Allergies  Allergen Reactions  . Codeine Nausea And Vomiting    Antimicrobials this admission:  Vancomycin 3/13>>  Zosyn 3/13>>  Microbiology results:  No cultures to date  Thank you for allowing pharmacy to be a part of this patient's care.  Arty Baumgartner, [place] Pager: [phone] or phone: [phone] 12/19/2018 7:34 PM

## 2018-12-19 NOTE — Telephone Encounter (Signed)
Patient called stating that his foot is still bad and he is calling for you to tell him what to do [phone]

## 2018-12-19 NOTE — Progress Notes (Signed)
Office Visit Note   Patient: Bradley Espinoza           Date of Birth: 1956-03-24           MRN: [id number] Visit Date: 12/19/2018              Requested by: Celene Squibb, MD [address] [place], [place] [address] PCP: Celene Squibb, MD  Chief Complaint  Patient presents with   Right Ankle - Routine Post Op    HDW removal in office 12/17/2018 Doxy BID c/o increased redness & Drainage and was advised to f/u if in need for IV ABX      HPI: The patient is a 63 year old gentleman who is seen for postoperative follow-up following a right ankle revision fusion on 11/26/2018.  He came in the office earlier during the week and was started on doxycycline for some increased redness and swelling about the ankle.  He comes back in today with worsening of the redness increased swelling and some drainage from the center of the incision.  He reports the pain is worsening as well.  He reports he has been having some high fevers at home but his temperature was 97.5 orally here.  He is not tachycardic or appearing septic but does have localized wound infection about the right ankle.  Assessment & Plan: Visit Diagnoses:  1. S/P ankle fusion   2. Wound infection after surgery     Plan: Patient is failing to respond to oral antibiotics and will be admitted for intravenous antibiotic therapy, Zosyn and vancomycin.  He will follow-up in the office once discharged.  Follow-Up Instructions: No follow-ups on file.   Ortho Exam  Patient is alert, oriented, no adenopathy, well-dressed, normal affect, normal respiratory effort. There is erythema and edema localized to the ankle.  The erythema does extend into the foot and into the distal ankle.  There is central dehiscence of the wound with some serous appearing drainage.  Sutures are intact.  He has intact pedal pulse.  Imaging: No results found.   Labs: Lab Results  Component Value Date   HGBA1C 5.1 06/26/2016   ESRSEDRATE 9 12/08/2018   REPTSTATUS 04/10/2017 FINAL 04/05/2017   GRAMSTAIN  04/05/2017    FEW WBC PRESENT, PREDOMINANTLY PMN NO ORGANISMS SEEN    CULT No growth aerobically or anaerobically. 04/05/2017     Lab Results  Component Value Date   ALBUMIN 4.3 08/12/2015   ALBUMIN 4.1 05/06/2015   ALBUMIN 4.0 09/15/2014    Body mass index is 22.43 kg/m.  Orders:  No orders of the defined types were placed in this encounter.  No orders of the defined types were placed in this encounter.    Procedures: No procedures performed  Clinical Data: No additional findings.  ROS:  All other systems negative, except as noted in the HPI. Review of Systems  Objective: Vital Signs: BP (!) 151/88 (BP Location: Right Arm, Patient Position: Sitting, Cuff Size: Normal)    Pulse 83    Temp (!) 97.5 F (36.4 C) (Oral)    Ht 6\' 1"  (1.854 m)    Wt 170 lb (77.1 kg)    BMI 22.43 kg/m   Specialty Comments:  No specialty comments available.  PMFS History: Patient Active Problem List   Diagnosis Date Noted   Wound infection following procedure 12/19/2018   Malunion of joint fusion ([facility]) 11/26/2018   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[facility])    Status post lumbar spinal fusion 09/10/2018  S/P ankle fusion 11/27/2017   Nonunion of subtalar arthrodesis    Avascular necrosis of talus, right ([facility])    Post-traumatic osteoarthritis, left ankle and foot 10/22/2017   DDD (degenerative disc disease), lumbar 04/05/2017   Fracture of L2 vertebra ([facility]) 01/12/2015   Acute osteomyelitis of calcaneum ([facility]) 10/09/2014   Dysuria 10/05/2014   Cellulitis 10/05/2014   Fall from ladder 09/21/2014   L2 vertebral fracture ([facility]) 09/21/2014   Bilateral calcaneal fractures 09/21/2014   Chronic pain 09/21/2014   Acute blood loss anemia 09/21/2014   Open fracture of both calcanei 09/15/2014   Past Medical History:  Diagnosis Date   Anxiety    Arthritis    low back pain, lumbar radiculopathy   Depression    Fracture     B/L ankles   GERD (gastroesophageal reflux disease)    Headache(784.0)    allergy related    History of kidney stones    History of stomach ulcers    Retained orthopedic hardware    failed retained hardware right foot   Wears glasses     Family History  Problem Relation Age of Onset   Diabetes Father    Cancer Other     Past Surgical History:  Procedure Laterality Date   ANKLE FUSION Right 05/11/2015   Procedure: Right Posterior Arthroscopic Subtalar Arthrodesis;  Surgeon: Newt Minion, MD;  Location: [facility];  Service: Orthopedics;  Laterality: Right;   ANKLE FUSION Right 11/27/2017   Procedure: RIGHT TIBIOCALCANEAL FUSION;  Surgeon: Newt Minion, MD;  Location: [facility];  Service: Orthopedics;  Laterality: Right;   ANKLE FUSION Right 11/26/2018   Procedure: RIGHT ANTERIOR ANKLE FUSION, APPLY VAC;  Surgeon: Newt Minion, MD;  Location: [facility];  Service: Orthopedics;  Laterality: Right;   ANTERIOR LAT LUMBAR FUSION N/A 04/05/2017   Procedure: Extreme Lateral Interbody Fusion - Lumbar three-lumbar four ,exploration of fusion Posterior augmentation with globus addition Removal hardware Lumbar one-three. Lumbar four-sacral one,  Removal internal bone growth stimulator;  Surgeon: Kary Kos, MD;  Location: [facility];  Service: Neurosurgery;  Laterality: N/A;   BACK SURGERY  2004   x 2   CERVICAL SPINE SURGERY  2008   ESOPHAGOGASTRODUODENOSCOPY     HARDWARE REMOVAL Right 10/09/2014   Procedure: Removal Deep Hardware, Irrigation and Debridement Calcaneus, Place Antibiotic Beads and Wound VAC ;  Surgeon: Newt Minion, MD;  Location: [facility];  Service: Orthopedics;  Laterality: Right;   HARDWARE REMOVAL Right 08/12/2015   Procedure: Removal Deep Hardware Right Foot;  Surgeon: Newt Minion, MD;  Location: [facility];  Service: Orthopedics;  Laterality: Right;   HARDWARE REMOVAL Right 11/26/2018   Procedure: REMOVAL RIGHT TIBIOCALCANEAL NAIL;  Surgeon: Newt Minion, MD;  Location: [facility];   Service: Orthopedics;  Laterality: Right;   I&D EXTREMITY Right 09/15/2014   Procedure: IRRIGATION AND DEBRIDEMENT Ankle;  Surgeon: Renette Butters, MD;  Location: [facility];  Service: Orthopedics;  Laterality: Right;   INGUINAL HERNIA REPAIR Bilateral    LAMINECTOMY WITH POSTERIOR LATERAL ARTHRODESIS LEVEL 4 N/A 09/10/2018   Procedure: Posterior Lateral Fusion - Thoracic Eleven-Thoracic Twelve - Thoracic Twelve-Lumbar One - Lumbar One-Lumbar Two - Lumbar Two-Lumbar Three with instrumentaion and PLA;  Surgeon: Kary Kos, MD;  Location: [facility];  Service: Neurosurgery;  Laterality: N/A;  Posterior Lateral Fusion - Thoracic Eleven-Thoracic Twelve - Thoracic Twelve-Lumbar One - Lumbar One-Lumbar Two - Lumbar Two-Lumbar Thre   LUMBAR PERCUTANEOUS PEDICLE SCREW 1 LEVEL N/A 04/05/2017  Procedure: LUMBAR PERCUTANEOUS PEDICLE SCREW LUMBAR THREE-FOUR;  Surgeon: Kary Kos, MD;  Location: [facility];  Service: Neurosurgery;  Laterality: N/A;   ORIF CALCANEOUS FRACTURE Right 09/19/2014   Procedure: OPEN REDUCTION INTERNAL FIXATION (ORIF) CALCANEOUS FRACTURE;  Surgeon: Newt Minion, MD;  Location: [facility];  Service: Orthopedics;  Laterality: Right;   ORIF CALCANEOUS FRACTURE Left 09/19/2014   Procedure: OPEN REDUCTION INTERNAL FIXATION (ORIF) CALCANEOUS FRACTURE;  Surgeon: Newt Minion, MD;  Location: [facility];  Service: Orthopedics;  Laterality: Left;   Social History   Occupational History    Comment: disabled  Tobacco Use   Smoking status: Current Some Day Smoker    Packs/day: 1.00    Years: 10.00    Pack years: 10.00    Types: Cigars    Start date: 10/08/1976    Last attempt to quit: 09/07/2014    Years since quitting: 4.2   Smokeless tobacco: Never Used   Tobacco comment: 06/26/16 reports smokes a cigar 2 times a week at the most  Substance and Sexual Activity   Alcohol use: No    Comment: 06/26/16 quit 1998   Drug use: No   Sexual activity: Yes    Birth control/protection: None

## 2018-12-19 NOTE — H&P (Signed)
           ROS  There were no vitals taken for this visit. Physical Exam   This encounter was created in error - please disregard.

## 2018-12-19 NOTE — Telephone Encounter (Signed)
Called pt and advised to come in today at 3:30 for follow up of possible cellulitis.

## 2018-12-20 LAB — HIV ANTIBODY (ROUTINE TESTING W REFLEX): HIV SCREEN 4TH GENERATION: NONREACTIVE

## 2018-12-20 MED ORDER — OXYCODONE HCL 5 MG PO TABS
10.0000 mg | ORAL_TABLET | ORAL | Status: DC | PRN
  Administered 2018-12-20 – 2018-12-22 (×8): 15 mg via ORAL
  Filled 2018-12-20 (×8): qty 3

## 2018-12-20 NOTE — Progress Notes (Addendum)
   Subjective:    Patient reports pain as severe.    Objective: Vital signs in last 24 hours: Temp:  [97.5 F (36.4 C)-100 F (37.8 C)] 97.7 F (36.5 C) (03/14 0414) Pulse Rate:  [70-83] 71 (03/14 0414) Resp:  [15-16] 15 (03/14 0414) BP: (141-153)/(72-88) 141/78 (03/14 0414) SpO2:  [95 %-97 %] 97 % (03/14 0414) Weight:  [77.1 kg] 77.1 kg (03/13 2348)  Intake/Output from previous day: 03/13 0701 - 03/14 0700 In: 800 [I.V.:500; IV Piggyback:300] Out: 250 [Urine:250] Intake/Output this shift: Total I/O In: 360 [P.O.:360] Out: 300 [Urine:300]  Recent Labs    12/19/18 1834  HGB 11.8*   Recent Labs    12/19/18 1834  WBC 6.9  RBC 4.03*  HCT 36.5*  PLT 214   Recent Labs    12/19/18 1834  NA 137  K 3.2*  CL 102  CO2 25  BUN 14  CREATININE 0.85  GLUCOSE 118*  CALCIUM 8.6*   No results for input(s): LABPT, INR in the last 72 hours.  dressing changed. less erytherma. trace 1cm  serosang. draiinage on 4X4 No results found.  Assessment/Plan:    Continue ABX therapy due to Post-op infection.    Pain not controlled. Will increase percocet to 7.5 same sig  Bradley Espinoza 12/20/2018, 1:39 PM

## 2018-12-21 ENCOUNTER — Encounter (HOSPITAL_COMMUNITY): Payer: Self-pay | Admitting: *Deleted

## 2018-12-21 NOTE — Progress Notes (Signed)
Patient ID: Bradley Espinoza, male   DOB: 05/07/1956, 63 y.o.   MRN: [id number] Examination patient still has cellulitis around the ankle.  The skin has less swelling the skin is wrinkling patient states that it looked better yesterday.  Patient is at 24 hours of his IV vancomycin and Zosyn.  Will reevaluate over the next several days discussed that if were not significantly improved by Tuesday will plan for removal of the deep retained hardware on Wednesday and patient will need a PICC line with IV antibiotics.  Discussed that if this does resolve nicely patient will still need a PICC line with 6 weeks of IV antibiotics.

## 2018-12-21 NOTE — Plan of Care (Signed)

## 2018-12-22 ENCOUNTER — Other Ambulatory Visit: Payer: Self-pay

## 2018-12-22 ENCOUNTER — Ambulatory Visit (INDEPENDENT_AMBULATORY_CARE_PROVIDER_SITE_OTHER): Payer: PPO | Admitting: Orthopedic Surgery

## 2018-12-22 ENCOUNTER — Other Ambulatory Visit (INDEPENDENT_AMBULATORY_CARE_PROVIDER_SITE_OTHER): Payer: Self-pay | Admitting: Orthopedic Surgery

## 2018-12-22 DIAGNOSIS — Z885 Allergy status to narcotic agent status: Secondary | ICD-10-CM

## 2018-12-22 DIAGNOSIS — Z720 Tobacco use: Secondary | ICD-10-CM

## 2018-12-22 DIAGNOSIS — Z981 Arthrodesis status: Secondary | ICD-10-CM

## 2018-12-22 DIAGNOSIS — T8149XA Infection following a procedure, other surgical site, initial encounter: Secondary | ICD-10-CM

## 2018-12-22 DIAGNOSIS — Z8781 Personal history of (healed) traumatic fracture: Secondary | ICD-10-CM

## 2018-12-22 LAB — BASIC METABOLIC PANEL
Anion gap: 8 (ref 5–15)
BUN: <5 mg/dL — ABNORMAL LOW (ref 8–23)
CALCIUM: 8.5 mg/dL — AB (ref 8.9–10.3)
CO2: 27 mmol/L (ref 22–32)
Chloride: 102 mmol/L (ref 98–111)
Creatinine, Ser: 0.79 mg/dL (ref 0.61–1.24)
GFR calc Af Amer: >60 mL/min (ref >60)
GFR calc non Af Amer: >60 mL/min (ref >60)
Glucose, Bld: 99 mg/dL (ref 70–99)
Potassium: 3.3 mmol/L — ABNORMAL LOW (ref 3.5–5.1)
Sodium: 137 mmol/L (ref 135–145)

## 2018-12-22 MED ORDER — CHLORHEXIDINE GLUCONATE 4 % EX LIQD
60.0000 mL | Freq: Once | CUTANEOUS | Status: AC
  Administered 2018-12-22: 4 via TOPICAL
  Filled 2018-12-22: qty 60

## 2018-12-22 MED ORDER — OXYCODONE-ACETAMINOPHEN 5-325 MG PO TABS
1.0000 | ORAL_TABLET | ORAL | Status: DC | PRN
  Administered 2018-12-22 – 2018-12-25 (×14): 2 via ORAL
  Filled 2018-12-22 (×14): qty 2

## 2018-12-22 MED ORDER — CEFAZOLIN SODIUM-DEXTROSE 2-4 GM/100ML-% IV SOLN
2.0000 g | INTRAVENOUS | Status: DC
  Filled 2018-12-22: qty 100

## 2018-12-22 NOTE — H&P (View-Only) (Signed)
Patient ID: Bradley Espinoza, male   DOB: [birth year], 63 y.o.   MRN: [id number] Despite 48 hours of IV antibiotics patient still has persistent cellulitis and pain.  The swelling has resolved.  Will plan for surgery tomorrow with removal of the deep retained hardware placement of antibiotic beads will get an infectious disease consult for 6 weeks of IV antibiotics.  Patient states the oxycodone does not work and he requests Percocet.

## 2018-12-22 NOTE — Care Management Important Message (Signed)
Important Message  Patient Details  Name: Bradley Espinoza MRN: [id number] Date of Birth: 02/14/1956   Medicare Important Message Given:       Orbie Pyo 12/22/2018, 4:30 PM

## 2018-12-22 NOTE — Progress Notes (Signed)
Patient ID: Bradley Espinoza, male   DOB: 08-Aug-1956, 63 y.o.   MRN: [id number] Despite 48 hours of IV antibiotics patient still has persistent cellulitis and pain.  The swelling has resolved.  Will plan for surgery tomorrow with removal of the deep retained hardware placement of antibiotic beads will get an infectious disease consult for 6 weeks of IV antibiotics.  Patient states the oxycodone does not work and he requests Percocet.

## 2018-12-22 NOTE — Consult Note (Signed)
[facility] for Infectious Disease    Date of Admission:  12/19/2018   Total days of antibiotics 6        Day 3 vancomycin        Day 3 piperacillin tazobactam              Reason for Consult: Postoperative infection of right ankle    Referring Provider: Dr. Meridee Score  Assessment: He has postoperative infection of his right ankle.  Surgery is planned today to remove hardware and get deep specimens for stain and culture.  I talked to him about the possibility of long-term IV antibiotic therapy with a PICC line.  He currently lives alone in [place].  Plan: 1. Continue current antibiotics pending operative findings, stains and cultures 2. I will talk to him more tomorrow about PICC placement depending on operative findings  Principal Problem:   Wound infection following procedure Active Problems:   Nonunion of subtalar arthrodesis   S/P ankle fusion   Scheduled Meds: . aspirin  325 mg Oral Daily  . docusate sodium  100 mg Oral BID  . gabapentin  300 mg Oral BID   And  . gabapentin  600 mg Oral QHS  . pantoprazole  40 mg Oral Daily  . senna  1 tablet Oral BID  . tamsulosin  0.4 mg Oral Daily  . [START ON 12/30/2018] testosterone cypionate  200 mg Intramuscular Q14 Days   Continuous Infusions: . sodium chloride 50 mL/hr at 12/21/18 1802  . methocarbamol (ROBAXIN) IV    . piperacillin-tazobactam (ZOSYN)  IV 3.375 g (12/22/18 1149)  . vancomycin 1,250 mg (12/22/18 0816)   PRN Meds:.acetaminophen, acetaminophen, diphenhydrAMINE, HYDROmorphone (DILAUDID) injection, magnesium citrate, methocarbamol **OR** methocarbamol (ROBAXIN) IV, ondansetron, oxyCODONE, oxyCODONE-acetaminophen, zolpidem  HPI: Bradley Espinoza is a 63 y.o. male who fell off a ladder in 2016 suffering bilateral calcaneal fractures and spinal fracture.  He has been through multiple surgeries.  He recently had surgery on his right foot on 11/26/2018.  An old calcaneal screw was removed and he  underwent ankle fusion with bone grafting and screws.  Postoperatively he developed some redness, swelling, increased pain and wound drainage.  He was started on doxycycline last week but did not improve leading to admission 3 days ago.  He was started on broad empiric antibiotic therapy.  He tells me he has not noted any improvement since admission.  He is scheduled for surgery later today.   Review of Systems: Review of Systems  Constitutional: Negative for chills, diaphoresis and fever.  Gastrointestinal: Negative for abdominal pain, diarrhea, nausea and vomiting.  Musculoskeletal: Positive for joint pain.    Past Medical History:  Diagnosis Date  . Anxiety   . Arthritis    low back pain, lumbar radiculopathy  . Depression   . Fracture    B/L ankles  . GERD (gastroesophageal reflux disease)   . Headache(784.0)    allergy related   . History of kidney stones   . History of stomach ulcers   . Retained orthopedic hardware    failed retained hardware right foot  . Wears glasses     Social History   Tobacco Use  . Smoking status: Current Some Day Smoker    Packs/day: 1.00    Years: 10.00    Pack years: 10.00    Types: Cigars    Start date: 10/08/1976    Last attempt to quit: 09/07/2014    Years since  quitting: 4.2  . Smokeless tobacco: Never Used  . Tobacco comment: 06/26/16 reports smokes a cigar 2 times a week at the most  Substance Use Topics  . Alcohol use: No    Comment: 06/26/16 quit 1998  . Drug use: No    Family History  Problem Relation Age of Onset  . Diabetes Father   . Cancer Other    Allergies  Allergen Reactions  . Codeine Nausea And Vomiting    OBJECTIVE: Blood pressure (!) 143/73, pulse 81, temperature 99.6 F (37.6 C), temperature source Oral, resp. rate 16, height 6\' 1"  (1.854 m), weight 77.1 kg, SpO2 96 %.  Physical Exam Constitutional:      Comments: He is resting quietly in bed.  Musculoskeletal:     Comments: He has multiple healed scars  on both feet.  He has a fresh surgical incision extending from his anterior midline lower leg down over the dorsum of his foot.  Drainage is crusted around the wound.  There is erythema and induration around the entire wound.  Psychiatric:        Mood and Affect: Mood normal.     Lab Results Lab Results  Component Value Date   WBC 6.9 12/19/2018   HGB 11.8 (L) 12/19/2018   HCT 36.5 (L) 12/19/2018   MCV 90.6 12/19/2018   PLT 214 12/19/2018    Lab Results  Component Value Date   CREATININE 0.79 12/22/2018   BUN <5 (L) 12/22/2018   NA 137 12/22/2018   K 3.3 (L) 12/22/2018   CL 102 12/22/2018   CO2 27 12/22/2018    Lab Results  Component Value Date   ALT 12 12/08/2018   AST 17 12/08/2018   ALKPHOS 82 08/12/2015   BILITOT 0.7 12/08/2018     Microbiology: No results found for this or any previous visit (from the past 240 hour(s)).  Michel Bickers, MD [facility] [phone] pager   [phone] cell 12/22/2018, 12:00 PM

## 2018-12-23 ENCOUNTER — Inpatient Hospital Stay (HOSPITAL_COMMUNITY): Payer: PPO | Admitting: Certified Registered Nurse Anesthetist

## 2018-12-23 ENCOUNTER — Encounter (HOSPITAL_COMMUNITY)
Admission: AD | Disposition: A | Discharge status: Home / Self Care | Payer: Self-pay | Source: Home / Self Care | Attending: Orthopedic Surgery

## 2018-12-23 DIAGNOSIS — T8149XA Infection following a procedure, other surgical site, initial encounter: Secondary | ICD-10-CM

## 2018-12-23 HISTORY — PX: HARDWARE REMOVAL: SHX979

## 2018-12-23 HISTORY — PX: APPLICATION OF WOUND VAC: SHX5189

## 2018-12-23 LAB — SURGICAL PCR SCREEN
MRSA, PCR: NEGATIVE
Staphylococcus aureus: NEGATIVE

## 2018-12-23 SURGERY — REMOVAL, HARDWARE
Anesthesia: General | Site: Ankle | Laterality: Right

## 2018-12-23 MED ORDER — MAGNESIUM CITRATE PO SOLN: 1.0000 | Freq: Once | ORAL | Status: DC | PRN

## 2018-12-23 MED ORDER — KETOROLAC TROMETHAMINE 30 MG/ML IJ SOLN
30.0000 mg | Freq: Once | INTRAMUSCULAR | Status: AC
  Administered 2018-12-23: 30 mg via INTRAVENOUS

## 2018-12-23 MED ORDER — HYDROMORPHONE HCL 1 MG/ML IJ SOLN
INTRAMUSCULAR | Status: AC
  Filled 2018-12-23: qty 1

## 2018-12-23 MED ORDER — METHOCARBAMOL 500 MG PO TABS
ORAL_TABLET | ORAL | Status: AC
  Filled 2018-12-23: qty 1

## 2018-12-23 MED ORDER — DEXAMETHASONE SODIUM PHOSPHATE 10 MG/ML IJ SOLN
INTRAMUSCULAR | Status: DC | PRN
  Administered 2018-12-23: 4 mg via INTRAVENOUS

## 2018-12-23 MED ORDER — METOCLOPRAMIDE HCL 5 MG PO TABS: 5.0000 mg | ORAL_TABLET | Freq: Three times a day (TID) | ORAL | Status: DC | PRN

## 2018-12-23 MED ORDER — PROMETHAZINE HCL 25 MG/ML IJ SOLN: 6.2500 mg | INTRAMUSCULAR | Status: DC | PRN

## 2018-12-23 MED ORDER — OXYCODONE HCL 5 MG PO TABS
10.0000 mg | ORAL_TABLET | ORAL | Status: DC | PRN
  Filled 2018-12-23: qty 2

## 2018-12-23 MED ORDER — FENTANYL BOLUS VIA INFUSION: 100.0000 ug | Freq: Once | INTRAVENOUS | Status: DC

## 2018-12-23 MED ORDER — POLYETHYLENE GLYCOL 3350 17 G PO PACK: 17.0000 g | PACK | Freq: Every day | ORAL | Status: DC | PRN

## 2018-12-23 MED ORDER — HYDROMORPHONE HCL 1 MG/ML IJ SOLN
0.2500 mg | INTRAMUSCULAR | Status: DC | PRN
  Administered 2018-12-23 (×4): 0.5 mg via INTRAVENOUS

## 2018-12-23 MED ORDER — MIDAZOLAM HCL 2 MG/2ML IJ SOLN
INTRAMUSCULAR | Status: AC
  Filled 2018-12-23: qty 2

## 2018-12-23 MED ORDER — GENTAMICIN SULFATE 40 MG/ML IJ SOLN
INTRAMUSCULAR | Status: AC
  Filled 2018-12-23: qty 4

## 2018-12-23 MED ORDER — LIDOCAINE 2% (20 MG/ML) 5 ML SYRINGE
INTRAMUSCULAR | Status: DC | PRN
  Administered 2018-12-23: 60 mg via INTRAVENOUS

## 2018-12-23 MED ORDER — METHOCARBAMOL 500 MG PO TABS
500.0000 mg | ORAL_TABLET | Freq: Four times a day (QID) | ORAL | Status: DC | PRN
  Administered 2018-12-23 – 2018-12-25 (×7): 500 mg via ORAL
  Filled 2018-12-23 (×6): qty 1

## 2018-12-23 MED ORDER — DOCUSATE SODIUM 100 MG PO CAPS
100.0000 mg | ORAL_CAPSULE | Freq: Two times a day (BID) | ORAL | Status: DC
  Administered 2018-12-24: 100 mg via ORAL
  Filled 2018-12-23 (×4): qty 1

## 2018-12-23 MED ORDER — KETOROLAC TROMETHAMINE 30 MG/ML IJ SOLN
INTRAMUSCULAR | Status: AC
  Filled 2018-12-23: qty 1

## 2018-12-23 MED ORDER — ACETAMINOPHEN 325 MG PO TABS: 325.0000 mg | ORAL_TABLET | Freq: Four times a day (QID) | ORAL | Status: DC | PRN

## 2018-12-23 MED ORDER — ONDANSETRON HCL 4 MG/2ML IJ SOLN
INTRAMUSCULAR | Status: AC
  Filled 2018-12-23: qty 2

## 2018-12-23 MED ORDER — OXYCODONE HCL 5 MG PO TABS
ORAL_TABLET | ORAL | Status: AC
  Filled 2018-12-23: qty 2

## 2018-12-23 MED ORDER — VANCOMYCIN HCL 500 MG IV SOLR
INTRAVENOUS | Status: AC
  Filled 2018-12-23: qty 500

## 2018-12-23 MED ORDER — FENTANYL CITRATE (PF) 250 MCG/5ML IJ SOLN
INTRAMUSCULAR | Status: AC
  Filled 2018-12-23: qty 5

## 2018-12-23 MED ORDER — BISACODYL 10 MG RE SUPP: 10.0000 mg | Freq: Every day | RECTAL | Status: DC | PRN

## 2018-12-23 MED ORDER — 0.9 % SODIUM CHLORIDE (POUR BTL) OPTIME
TOPICAL | Status: DC | PRN
  Administered 2018-12-23: 1000 mL

## 2018-12-23 MED ORDER — GENTAMICIN SULFATE 40 MG/ML IJ SOLN
INTRAMUSCULAR | Status: DC | PRN
  Administered 2018-12-23: 120 mg

## 2018-12-23 MED ORDER — ACETAMINOPHEN 10 MG/ML IV SOLN
INTRAVENOUS | Status: AC
  Filled 2018-12-23: qty 100

## 2018-12-23 MED ORDER — FENTANYL CITRATE (PF) 100 MCG/2ML IJ SOLN
INTRAMUSCULAR | Status: AC
  Administered 2018-12-23: 100 ug
  Filled 2018-12-23: qty 2

## 2018-12-23 MED ORDER — METHOCARBAMOL 1000 MG/10ML IJ SOLN
500.0000 mg | Freq: Four times a day (QID) | INTRAVENOUS | Status: DC | PRN
  Filled 2018-12-23: qty 5

## 2018-12-23 MED ORDER — SODIUM CHLORIDE 0.9 % IV SOLN: INTRAVENOUS | Status: DC

## 2018-12-23 MED ORDER — OXYCODONE HCL 5 MG PO TABS
5.0000 mg | ORAL_TABLET | ORAL | Status: DC | PRN
  Administered 2018-12-23: 10 mg via ORAL
  Filled 2018-12-23: qty 2

## 2018-12-23 MED ORDER — ROPIVACAINE HCL 5 MG/ML IJ SOLN
INTRAMUSCULAR | Status: DC | PRN
  Administered 2018-12-23 (×2): 20 mL via PERINEURAL

## 2018-12-23 MED ORDER — PROPOFOL 10 MG/ML IV BOLUS
INTRAVENOUS | Status: DC | PRN
  Administered 2018-12-23: 200 mg via INTRAVENOUS

## 2018-12-23 MED ORDER — LACTATED RINGERS IV SOLN
INTRAVENOUS | Status: DC
  Administered 2018-12-23: 09:00:00 via INTRAVENOUS

## 2018-12-23 MED ORDER — FENTANYL CITRATE (PF) 250 MCG/5ML IJ SOLN
INTRAMUSCULAR | Status: DC | PRN
  Administered 2018-12-23 (×5): 50 ug via INTRAVENOUS

## 2018-12-23 MED ORDER — MIDAZOLAM HCL 2 MG/2ML IJ SOLN
INTRAMUSCULAR | Status: DC | PRN
  Administered 2018-12-23: 2 mg via INTRAVENOUS

## 2018-12-23 MED ORDER — ONDANSETRON HCL 4 MG/2ML IJ SOLN: 4.0000 mg | Freq: Four times a day (QID) | INTRAMUSCULAR | Status: DC | PRN

## 2018-12-23 MED ORDER — ONDANSETRON HCL 4 MG/2ML IJ SOLN
INTRAMUSCULAR | Status: DC | PRN
  Administered 2018-12-23: 4 mg via INTRAVENOUS

## 2018-12-23 MED ORDER — VANCOMYCIN HCL 500 MG IV SOLR
INTRAVENOUS | Status: DC | PRN
  Administered 2018-12-23: 500 mg

## 2018-12-23 MED ORDER — METOCLOPRAMIDE HCL 5 MG/ML IJ SOLN: 5.0000 mg | Freq: Three times a day (TID) | INTRAMUSCULAR | Status: DC | PRN

## 2018-12-23 MED ORDER — HYDROMORPHONE HCL 1 MG/ML IJ SOLN
0.5000 mg | INTRAMUSCULAR | Status: DC | PRN
  Administered 2018-12-23 – 2018-12-24 (×2): 0.5 mg via INTRAVENOUS
  Administered 2018-12-24 – 2018-12-25 (×4): 1 mg via INTRAVENOUS
  Filled 2018-12-23 (×6): qty 1

## 2018-12-23 MED ORDER — ACETAMINOPHEN 10 MG/ML IV SOLN
1000.0000 mg | Freq: Once | INTRAVENOUS | Status: DC | PRN
  Administered 2018-12-23: 1000 mg via INTRAVENOUS

## 2018-12-23 MED ORDER — PROPOFOL 10 MG/ML IV BOLUS
INTRAVENOUS | Status: AC
  Filled 2018-12-23: qty 20

## 2018-12-23 MED ORDER — ONDANSETRON HCL 4 MG PO TABS: 4.0000 mg | ORAL_TABLET | Freq: Four times a day (QID) | ORAL | Status: DC | PRN

## 2018-12-23 SURGICAL SUPPLY — 56 items
BANDAGE ESMARK 6X9 LF (GAUZE/BANDAGES/DRESSINGS) IMPLANT
BNDG COHESIVE 4X5 TAN STRL (GAUZE/BANDAGES/DRESSINGS) IMPLANT
BNDG ESMARK 6X9 LF (GAUZE/BANDAGES/DRESSINGS)
BNDG GAUZE ELAST 4 BULKY (GAUZE/BANDAGES/DRESSINGS) ×4 IMPLANT
BOOT STEPPER DURA XLG (SOFTGOODS) ×2 IMPLANT
CHLORAPREP W/TINT 26ML (MISCELLANEOUS) ×2 IMPLANT
CONNECTOR Y ATS VAC SYSTEM (MISCELLANEOUS) ×2 IMPLANT
COVER SURGICAL LIGHT HANDLE (MISCELLANEOUS) ×8 IMPLANT
COVER WAND RF STERILE (DRAPES) ×4 IMPLANT
DRAPE C-ARM MINI 42X72 WSTRAPS (DRAPES) ×2 IMPLANT
DRAPE EXTREMITY TIBURON (DRAPES) ×2 IMPLANT
DRAPE INCISE IOBAN 66X45 STRL (DRAPES) ×2 IMPLANT
DRAPE ORTHO SPLIT 77X108 STRL (DRAPES) ×4
DRAPE STERI IOBAN 125X83 (DRAPES) ×2 IMPLANT
DRAPE SURG ORHT 6 SPLT 77X108 (DRAPES) IMPLANT
DRAPE U-SHAPE 47X51 STRL (DRAPES) ×4 IMPLANT
DRESSING PEEL AND PLAC PRVNA20 (GAUZE/BANDAGES/DRESSINGS) IMPLANT
DRESSING PREVENA PLUS CUSTOM (GAUZE/BANDAGES/DRESSINGS) IMPLANT
DRSG EMULSION OIL 3X3 NADH (GAUZE/BANDAGES/DRESSINGS) ×4 IMPLANT
DRSG PAD ABDOMINAL 8X10 ST (GAUZE/BANDAGES/DRESSINGS) ×4 IMPLANT
DRSG PEEL AND PLACE PREVENA 20 (GAUZE/BANDAGES/DRESSINGS) ×4
DRSG PREVENA PLUS CUSTOM (GAUZE/BANDAGES/DRESSINGS) ×4
ELECT REM PT RETURN 9FT ADLT (ELECTROSURGICAL) ×4
ELECTRODE REM PT RTRN 9FT ADLT (ELECTROSURGICAL) ×2 IMPLANT
GAUZE SPONGE 4X4 12PLY STRL (GAUZE/BANDAGES/DRESSINGS) ×4 IMPLANT
GLOVE BIOGEL PI IND STRL 9 (GLOVE) ×2 IMPLANT
GLOVE BIOGEL PI INDICATOR 9 (GLOVE) ×2
GLOVE SURG ORTHO 9.0 STRL STRW (GLOVE) ×6 IMPLANT
GLOVE SURG SS PI 8.0 STRL IVOR (GLOVE) ×4 IMPLANT
GOWN STRL REUS W/ TWL XL LVL3 (GOWN DISPOSABLE) ×6 IMPLANT
GOWN STRL REUS W/TWL 2XL LVL3 (GOWN DISPOSABLE) ×2 IMPLANT
GOWN STRL REUS W/TWL XL LVL3 (GOWN DISPOSABLE) ×4
KIT BASIN OR (CUSTOM PROCEDURE TRAY) ×4 IMPLANT
KIT PREVENA INCISION MGT20CM45 (CANNISTER) ×2 IMPLANT
KIT STIMULAN RAPID CURE 5CC (Orthopedic Implant) ×2 IMPLANT
KIT TURNOVER KIT B (KITS) ×4 IMPLANT
MANIFOLD NEPTUNE II (INSTRUMENTS) ×4 IMPLANT
NS IRRIG 1000ML POUR BTL (IV SOLUTION) ×4 IMPLANT
PACK ORTHO EXTREMITY (CUSTOM PROCEDURE TRAY) ×4 IMPLANT
PAD ARMBOARD 7.5X6 YLW CONV (MISCELLANEOUS) ×8 IMPLANT
SPONGE LAP 18X18 RF (DISPOSABLE) IMPLANT
STAPLER VISISTAT 35W (STAPLE) IMPLANT
STOCKINETTE IMPERVIOUS 9X36 MD (GAUZE/BANDAGES/DRESSINGS) IMPLANT
SUCTION FRAZIER HANDLE 10FR (MISCELLANEOUS) ×2
SUCTION TUBE FRAZIER 10FR DISP (MISCELLANEOUS) IMPLANT
SUT ETHILON 2 0 PSLX (SUTURE) ×4 IMPLANT
SUT VIC AB 0 CT1 27 (SUTURE)
SUT VIC AB 0 CT1 27XBRD ANBCTR (SUTURE) IMPLANT
SUT VIC AB 2-0 CT1 27 (SUTURE)
SUT VIC AB 2-0 CT1 TAPERPNT 27 (SUTURE) IMPLANT
TOWEL OR 17X24 6PK STRL BLUE (TOWEL DISPOSABLE) ×4 IMPLANT
TOWEL OR 17X26 10 PK STRL BLUE (TOWEL DISPOSABLE) ×4 IMPLANT
TUBE CONNECTING 12'X1/4 (SUCTIONS) ×1
TUBE CONNECTING 12X1/4 (SUCTIONS) ×1 IMPLANT
UNDERPAD 30X30 (UNDERPADS AND DIAPERS) ×4 IMPLANT
WATER STERILE IRR 1000ML POUR (IV SOLUTION) ×4 IMPLANT

## 2018-12-23 NOTE — Progress Notes (Signed)
Dentures and glasses picked up by floor RN prior to patient going to surgery.

## 2018-12-23 NOTE — Op Note (Signed)
12/23/2018  10:03 AM  PATIENT:  Bradley Espinoza    PRE-OPERATIVE DIAGNOSIS:  Infected Right Ankle Hardware  POST-OPERATIVE DIAGNOSIS:  Same  PROCEDURE:  RIGHT ANKLE REMOVE HARDWARE, PLACE ANTIBIOTIC BEADS and placement of wound vac, Application Of Wound Vac Debridement skin and soft tissue muscle and bone with excision skin and soft tissue muscle and tendon.  SURGEON:  Newt Minion, MD  PHYSICIAN ASSISTANT: Ky Barban ANESTHESIA:   General  PREOPERATIVE INDICATIONS:  Bradley Espinoza is a  63 y.o. male with a diagnosis of Infected Right Ankle Hardware who failed conservative measures and elected for surgical management.    The risks benefits and alternatives were discussed with the patient preoperatively including but not limited to the risks of infection, bleeding, nerve injury, cardiopulmonary complications, the need for revision surgery, among others, and the patient was willing to proceed.  OPERATIVE IMPLANTS: Stimulant antibiotic beads 5 cc with 500 mg vancomycin and 160 mg gentamicin  @ENCIMAGES @  OPERATIVE FINDINGS: No purulent abscess however there was necrotic changes to the anterior tibial tendon fibrinous soft tissue changes no evidence of bone infection  Soft tissue and fluid was sent for cultures.  Patient is currently on vancomycin and Zosyn.  OPERATIVE PROCEDURE: Patient was brought the operating room underwent a general anesthetic.  After adequate levels anesthesia were obtained patient's right lower extremity was prepped using ChloraPrep draped into a sterile field a timeout was called.  Elliptical incision was made around the surgical wound to ellipse out the draining ulcers.  This left a wound that was 10 cm in length 1 cm in width.  Blunt dissection was carried down to the plate fibrous tissue was sent for cultures.  There is no purulent abscess.  There was necrotic changes to the anterior tibial tendon this was resected.  The tibial talar joint was further  debrided no signs of infection within the tibial talar joint this was debrided with a rondure and a Cobb elevator.  A Cobb was used to further debride the bone skin and soft tissue with excision of fibrinous tissue.  This was all sent for cultures.  The plate and screws were removed without complications.  There is no evidence of infection within the screw holes.  The wound was irrigated with normal saline.  The wound edges were healthy and viable.  Local tissue rearrangement was used to close the wound that was 10 x 1 cm.  There was good closure without tension on the skin 2-0 nylon was used.  A 20 cm Praveena VAC was applied this had a good suction fit patient was placed in a fracture boot extubated taken the PACU in stable condition.   DISCHARGE PLANNING:  Antibiotic duration: Antibiotic duration to be determined by infectious disease.  Continue with Vanco and Zosyn at this time.  Weightbearing: Touchdown weightbearing on the right with a fracture boot.  Pain medication: Continue opioid pathway  Dressing care/ Wound VAC: Wound VAC continue for 1 week  Ambulatory devices: Walker  Discharge to: Possible discharge to home versus skilled nursing  Follow-up: In the office 1 week post operative.

## 2018-12-23 NOTE — Evaluation (Signed)
Physical Therapy Evaluation Patient Details Name: Bradley Espinoza MRN: [id number] DOB: [birth year] Today's Date: 12/23/2018   History of Present Illness  Patient is a 63 y/o male s/p R ankle hardware removal and placement of Abx beads and wound vac. PMH signficant for B calcaneal fx with surgery on R x5, depression, back surgery x3, anxiety.  Clinical Impression  Pt admitted with above diagnosis. Pt currently with functional limitations due to the deficits listed below (see PT Problem List). Pt mobilizing well and keeping RLE NWB, encouraged him to wear shoe on L foot all times that he is up for protection and cushion as he has unstable fx in that foot as well.  Pt will benefit from skilled PT to increase their independence and safety with mobility to allow discharge to the venue listed below.       Follow Up Recommendations No PT follow up    Equipment Recommendations  None recommended by PT    Recommendations for Other Services       Precautions / Restrictions Precautions Precautions: Fall Required Braces or Orthoses: Other Brace Other Brace: CAM walker Restrictions Weight Bearing Restrictions: Yes RLE Weight Bearing: Non weight bearing      Mobility  Bed Mobility Overal bed mobility: Independent                Transfers Overall transfer level: Needs assistance Equipment used: Rolling walker (2 wheeled) Transfers: Sit to/from Stand Sit to Stand: Supervision         General transfer comment: pt stood to RW without need for physical asisst, moves quickly and needs reminding to be mindful of lines   Ambulation/Gait Ambulation/Gait assistance: Supervision Gait Distance (Feet): 15 Feet Assistive device: Rolling walker (2 wheeled) Gait Pattern/deviations: Step-to pattern Gait velocity: appropriate for NWB LE Gait velocity interpretation: 1.31 - 2.62 ft/sec, indicative of limited community ambulator General Gait Details: pt able to hop on L foot to keep R NWB.  Pt reports pain L foot and need fir sx down the road. Encouraged him to wear shoe on L foot at all times, even just around his home for protection and support  Stairs            Wheelchair Mobility    Modified Rankin (Stroke Patients Only)       Balance Overall balance assessment: Mild deficits observed, not formally tested                                           Pertinent Vitals/Pain Pain Assessment: Faces Faces Pain Scale: Hurts even more Pain Location: R foot Pain Descriptors / Indicators: Discomfort;Guarding Pain Intervention(s): Limited activity within patient's tolerance;Monitored during session;Premedicated before session    Home Living Family/patient expects to be discharged to:: Private residence Living Arrangements: Alone Available Help at Discharge: Friend(s);Available PRN/intermittently Type of Home: House Home Access: Stairs to enter Entrance Stairs-Rails: None Entrance Stairs-Number of Steps: 1 Home Layout: One level Home Equipment: Cane - single point;Shower seat;Walker - 4 wheels;Hospital bed;Wheelchair - manual;Bedside commode;Crutches      Prior Function Level of Independence: Independent with assistive device(s)         Comments: pt runs a small sheep farm. Uses crutches in house, RW for longer distances in home and w/c out of home.      Hand Dominance   Dominant Hand: Right    Extremity/Trunk Assessment   Upper  Extremity Assessment Upper Extremity Assessment: Overall WFL for tasks assessed    Lower Extremity Assessment Lower Extremity Assessment: RLE deficits/detail RLE Deficits / Details: R CAM boot and wound vac RLE: Unable to fully assess due to immobilization RLE Sensation: decreased light touch RLE Coordination: WNL    Cervical / Trunk Assessment Cervical / Trunk Assessment: Normal  Communication   Communication: No difficulties  Cognition Arousal/Alertness: Awake/alert Behavior During Therapy: WFL  for tasks assessed/performed;Agitated Overall Cognitive Status: Within Functional Limits for tasks assessed                                        General Comments General comments (skin integrity, edema, etc.): VSS    Exercises     Assessment/Plan    PT Assessment Patient needs continued PT services  PT Problem List Decreased mobility;Pain       PT Treatment Interventions DME instruction;Gait training;Stair training;Functional mobility training;Therapeutic activities;Therapeutic exercise;Balance training;Patient/family education    PT Goals (Current goals can be found in the Care Plan section)  Acute Rehab PT Goals Patient Stated Goal: return home PT Goal Formulation: With patient Time For Goal Achievement: 01/06/19 Potential to Achieve Goals: Good    Frequency Min 3X/week   Barriers to discharge Decreased caregiver support pt reports he drives himself even with the CAM boot    Co-evaluation               AM-PAC PT "6 Clicks" Mobility  Outcome Measure Help needed turning from your back to your side while in a flat bed without using bedrails?: None Help needed moving from lying on your back to sitting on the side of a flat bed without using bedrails?: None Help needed moving to and from a bed to a chair (including a wheelchair)?: None Help needed standing up from a chair using your arms (e.g., wheelchair or bedside chair)?: A Little Help needed to walk in hospital room?: A Little Help needed climbing 3-5 steps with a railing? : A Little 6 Click Score: 21    End of Session Equipment Utilized During Treatment: Oxygen Activity Tolerance: Patient tolerated treatment well Patient left: in chair;with call bell/phone within reach Nurse Communication: Mobility status PT Visit Diagnosis: Other abnormalities of gait and mobility (R26.89);Pain Pain - Right/Left: Right Pain - part of body: Ankle and joints of foot    Time: 1535-1555 PT Time  Calculation (min) (ACUTE ONLY): 20 min   Charges:   PT Evaluation $PT Eval Low Complexity: 1 Low          Leighton Roach, [place]  Pager [phone] Office [place] 12/23/2018, 4:14 PM

## 2018-12-23 NOTE — Anesthesia Procedure Notes (Signed)
Anesthesia Regional Block: Adductor canal block   Pre-Anesthetic Checklist: ,, timeout performed, Correct Patient, Correct Site, Correct Laterality, Correct Procedure,, site marked, risks and benefits discussed, Surgical consent,  Pre-op evaluation,  At surgeon's request and post-op pain management  Laterality: Right  Prep: chloraprep       Needles:  Injection technique: Single-shot  Needle Type: Echogenic Stimulator Needle     Needle Length: 9cm  Needle Gauge: 21     Additional Needles:   Procedures:,,,, ultrasound used (permanent image in chart),,,,  Narrative:  Start time: 12/23/2018 11:30 AM End time: 12/23/2018 11:40 AM Injection made incrementally with aspirations every 5 mL.  Performed by: Personally  Anesthesiologist: Murvin Natal, MD  Additional Notes: Functioning IV was confirmed and monitors were applied. A time-out was performed. Hand hygiene and sterile gloves were used. The thigh was placed in a frog-leg position and prepped in a sterile fashion. A 50mm 21ga Arrow echogenic stimulator needle was placed using ultrasound guidance.  Negative aspiration and negative test dose prior to incremental administration of local anesthetic. The patient tolerated the procedure well.

## 2018-12-23 NOTE — Progress Notes (Signed)
Orthopedic Tech Progress Note Patient Details:  Bradley Espinoza 03/21/1956 [id number]  Ortho Devices Type of Ortho Device: CAM walker       [name] 12/23/2018, 9:37 AM

## 2018-12-23 NOTE — Anesthesia Postprocedure Evaluation (Signed)
Anesthesia Post Note  Patient: Bradley Espinoza  Procedure(s) Performed: RIGHT ANKLE REMOVE HARDWARE, PLACE ANTIBIOTIC BEADS and placement of wound vac (Right Ankle) Application Of Wound Vac (Ankle)     Patient location during evaluation: PACU Anesthesia Type: General and Regional Level of consciousness: awake and alert Pain management: pain level controlled Vital Signs Assessment: post-procedure vital signs reviewed and stable Respiratory status: spontaneous breathing, nonlabored ventilation, respiratory function stable and patient connected to nasal cannula oxygen Cardiovascular status: blood pressure returned to baseline and stable Postop Assessment: no apparent nausea or vomiting Anesthetic complications: no    Last Vitals:  Vitals:   12/23/18 1200 12/23/18 1221  BP: (!) 146/78 137/74  Pulse: 63 67  Resp: 16 16  Temp: 36.8 C 36.6 C  SpO2: 98% 98%    Last Pain:  Vitals:   12/23/18 1221  TempSrc: Oral  PainSc:                  Karyl Kinnier [place]

## 2018-12-23 NOTE — Interval H&P Note (Signed)
History and Physical Interval Note:  12/23/2018 6:31 AM  Bradley Espinoza  has presented today for surgery, with the diagnosis of Infected Right Ankle Hardware.  The various methods of treatment have been discussed with the patient and family. After consideration of risks, benefits and other options for treatment, the patient has consented to  Procedure(s): RIGHT ANKLE REMOVE HARDWARE, PLACE ANTIBIOTIC BEADS (Right) as a surgical intervention.  The patient's history has been reviewed, patient examined, no change in status, stable for surgery.  I have reviewed the patient's chart and labs.  Questions were answered to the patient's satisfaction.     Newt Minion

## 2018-12-23 NOTE — Transfer of Care (Signed)
Immediate Anesthesia Transfer of Care Note  Patient: Bradley Espinoza  Procedure(s) Performed: RIGHT ANKLE REMOVE HARDWARE, PLACE ANTIBIOTIC BEADS and placement of wound vac (Right Ankle) Application Of Wound Vac (Ankle)  Patient Location: PACU  Anesthesia Type:General  Level of Consciousness: awake, alert  and oriented  Airway & Oxygen Therapy: Patient Spontanous Breathing and Patient connected to nasal cannula oxygen  Post-op Assessment: Report given to RN and Post -op Vital signs reviewed and stable  Post vital signs: Reviewed and stable  Last Vitals:  Vitals Value Taken Time  BP 163/96 12/23/2018 10:06 AM  Temp    Pulse 85 12/23/2018 10:08 AM  Resp 28 12/23/2018 10:08 AM  SpO2 100 % 12/23/2018 10:08 AM  Vitals shown include unvalidated device data.  Last Pain:  Vitals:   12/23/18 0517  TempSrc: Oral  PainSc:       Patients Stated Pain Goal: 4 (58/30/94 0768)  Complications: No apparent anesthesia complications

## 2018-12-23 NOTE — Progress Notes (Signed)
Pt c/o 10/10 pain after all meds given. Dr. Roanna Banning updated. Pt agrees to nerve block.

## 2018-12-23 NOTE — Progress Notes (Signed)
Patient ID: Bradley Espinoza, male   DOB: 1956/06/10, 63 y.o.   MRN: [id number]         [facility] for Infectious Disease  Date of Admission:  12/19/2018   Total days of antibiotic therapy 7        Day 5 vancomycin        Day 5 piperacillin tazobactam ASSESSMENT: He had surgery today to remove the ankle fusion hardware from his right ankle.  Soft tissue and bone were debrided.  Dr. Sharol Given did not note any evidence of bone infection.  Operative Gram stain reveals gram-positive cocci.  I will continue vancomycin and stop piperacillin tazobactam now.  PLAN: 1. Continue vancomycin pending final cultures  Principal Problem:   Wound infection following procedure Active Problems:   Nonunion of subtalar arthrodesis   S/P ankle fusion   Scheduled Meds: . aspirin  325 mg Oral Daily  . docusate sodium  100 mg Oral BID  . gabapentin  300 mg Oral BID   And  . gabapentin  600 mg Oral QHS  . HYDROmorphone      . HYDROmorphone      . ketorolac      . methocarbamol      . oxyCODONE      . pantoprazole  40 mg Oral Daily  . senna  1 tablet Oral BID  . tamsulosin  0.4 mg Oral Daily  . [START ON 12/30/2018] testosterone cypionate  200 mg Intramuscular Q14 Days   Continuous Infusions: . sodium chloride 50 mL/hr at 12/23/18 1229  . sodium chloride    . acetaminophen    . methocarbamol (ROBAXIN) IV    . piperacillin-tazobactam (ZOSYN)  IV 3.375 g (12/23/18 1450)  . vancomycin 1,250 mg (12/23/18 0802)   PRN Meds:.acetaminophen, [START ON 12/24/2018] acetaminophen, bisacodyl, diphenhydrAMINE, HYDROmorphone (DILAUDID) injection, magnesium citrate, methocarbamol **OR** methocarbamol (ROBAXIN) IV, metoCLOPramide **OR** metoCLOPramide (REGLAN) injection, ondansetron **OR** ondansetron (ZOFRAN) IV, oxyCODONE, oxyCODONE, oxyCODONE-acetaminophen, polyethylene glycol, zolpidem   SUBJECTIVE: He is not in much pain.  Review of Systems: Review of Systems  Constitutional: Negative for chills,  diaphoresis and fever.  Gastrointestinal: Negative for abdominal pain, diarrhea, nausea and vomiting.  Musculoskeletal: Positive for joint pain.    Allergies  Allergen Reactions  . Codeine Nausea And Vomiting    OBJECTIVE: Vitals:   12/23/18 1145 12/23/18 1159 12/23/18 1200 12/23/18 1221  BP:  (!) 146/78 (!) 146/78 137/74  Pulse: 66 63 63 67  Resp: 20 15 16 16   Temp:   98.3 F (36.8 C) 97.8 F (36.6 C)  TempSrc:    Oral  SpO2: 99% 99% 98% 98%  Weight:      Height:       Body mass index is 22.43 kg/m.  Physical Exam Constitutional:      Comments: He is in no distress.  He is sitting up in a chair watching television.  Musculoskeletal:     Comments: He has a hard boot on his right lower leg.  Psychiatric:        Mood and Affect: Mood normal.     Lab Results Lab Results  Component Value Date   WBC 6.9 12/19/2018   HGB 11.8 (L) 12/19/2018   HCT 36.5 (L) 12/19/2018   MCV 90.6 12/19/2018   PLT 214 12/19/2018    Lab Results  Component Value Date   CREATININE 0.79 12/22/2018   BUN <5 (L) 12/22/2018   NA 137 12/22/2018   K 3.3 (L) 12/22/2018   CL  102 12/22/2018   CO2 27 12/22/2018    Lab Results  Component Value Date   ALT 12 12/08/2018   AST 17 12/08/2018   ALKPHOS 82 08/12/2015   BILITOT 0.7 12/08/2018     Microbiology: Recent Results (from the past 240 hour(s))  Surgical pcr screen     Status: None   Collection Time: 12/23/18  4:03 AM  Result Value Ref Range Status   MRSA, PCR NEGATIVE NEGATIVE Final   Staphylococcus aureus NEGATIVE NEGATIVE Final    Comment: (NOTE) The Xpert SA Assay (FDA approved for NASAL specimens in patients 65 years of age and older), is one component of a comprehensive surveillance program. It is not intended to diagnose infection nor to guide or monitor treatment. Performed at [facility], [place] [address], [place], [place] [address]   Aerobic/Anaerobic Culture (surgical/deep wound)     Status: None (Preliminary  result)   Collection Time: 12/23/18  9:21 AM  Result Value Ref Range Status   Specimen Description TISSUE RIGHT ANKLE  Final   Special Requests PATIENT ON FOLLOWING VANC AND ZOSYN  Final   Gram Stain   Final    FEW WBC PRESENT,BOTH PMN AND MONONUCLEAR RARE GRAM POSITIVE COCCI Performed at [facility], [place] [address] [place], [place] [address]    Culture PENDING  Incomplete   Report Status PENDING  Incomplete    Michel Bickers, MD [facility] for [facility] [place] [facility] [phone] pager   [phone] cell 12/23/2018, 4:23 PM

## 2018-12-23 NOTE — Anesthesia Preprocedure Evaluation (Addendum)
Anesthesia Evaluation  Patient identified by MRN, date of birth, ID band Patient awake    Reviewed: Allergy & Precautions, NPO status , Patient's Chart, lab work & pertinent test results  Airway Mallampati: II  TM Distance: >3 FB Neck ROM: Full    Dental  (+) Poor Dentition, Missing   Pulmonary former smoker,    Pulmonary exam normal breath sounds clear to auscultation       Cardiovascular negative cardio ROS Normal cardiovascular exam Rhythm:Regular Rate:Normal  ECG: NSR, rate 63   Neuro/Psych  Headaches, PSYCHIATRIC DISORDERS Anxiety Depression    GI/Hepatic Neg liver ROS, GERD  Medicated and Controlled,History of stomach ulcers   Endo/Other  negative endocrine ROS  Renal/GU negative Renal ROS     Musculoskeletal negative musculoskeletal ROS (+)   Abdominal   Peds  Hematology  (+) anemia ,   Anesthesia Other Findings Infected Right Ankle Hardware  Reproductive/Obstetrics                            Anesthesia Physical Anesthesia Plan  ASA: III  Anesthesia Plan: General   Post-op Pain Management:    Induction: Intravenous  PONV Risk Score and Plan: 1 and Ondansetron, Dexamethasone, Midazolam and Treatment may vary due to age or medical condition  Airway Management Planned: LMA  Additional Equipment:   Intra-op Plan:   Post-operative Plan: Extubation in OR  Informed Consent: I have reviewed the patients History and Physical, chart, labs and discussed the procedure including the risks, benefits and alternatives for the proposed anesthesia with the patient or authorized representative who has indicated his/her understanding and acceptance.     Dental advisory given  Plan Discussed with: CRNA  Anesthesia Plan Comments:         Anesthesia Quick Evaluation

## 2018-12-23 NOTE — Anesthesia Procedure Notes (Signed)
Anesthesia Regional Block: Popliteal block   Pre-Anesthetic Checklist: ,, timeout performed, Correct Patient, Correct Site, Correct Laterality, Correct Procedure,, site marked, risks and benefits discussed, Surgical consent,  Pre-op evaluation,  At surgeon's request and post-op pain management  Laterality: Right  Prep: chloraprep       Needles:  Injection technique: Single-shot  Needle Type: Echogenic Stimulator Needle     Needle Length: 9cm  Needle Gauge: 21     Additional Needles:   Procedures:,,,, ultrasound used (permanent image in chart),,,,   Nerve Stimulator or Paresthesia:  Response: Dorsiflexion and plantarflexion, 0.6 mA,   Additional Responses:   Narrative:  Start time: 12/23/2018 11:20 AM End time: 12/23/2018 11:30 AM Injection made incrementally with aspirations every 5 mL.  Performed by: Personally  Anesthesiologist: Murvin Natal, MD  Additional Notes: Functioning IV was confirmed and monitors were applied.  A 41mm 21ga Arrow echogenic stimulator needle was used. Sterile prep, hand hygiene and sterile gloves were used.  Negative aspiration and negative test dose prior to incremental administration of local anesthetic. The patient tolerated the procedure well.

## 2018-12-23 NOTE — Anesthesia Procedure Notes (Signed)
Procedure Name: LMA Insertion Date/Time: 12/23/2018 9:23 AM Performed by: Bryson Corona, CRNA Pre-anesthesia Checklist: Patient identified, Emergency Drugs available, Suction available and Patient being monitored Patient Re-evaluated:Patient Re-evaluated prior to induction Oxygen Delivery Method: Circle System Utilized Preoxygenation: Pre-oxygenation with 100% oxygen Induction Type: IV induction Ventilation: Mask ventilation without difficulty LMA: LMA inserted LMA Size: 5.0 Number of attempts: 1 Placement Confirmation: positive ETCO2 Tube secured with: Tape Dental Injury: Teeth and Oropharynx as per pre-operative assessment

## 2018-12-24 ENCOUNTER — Encounter (HOSPITAL_COMMUNITY): Payer: Self-pay | Admitting: Orthopedic Surgery

## 2018-12-24 LAB — VANCOMYCIN, PEAK: Vancomycin Pk: 53 ug/mL (ref 30–40)

## 2018-12-24 NOTE — Progress Notes (Signed)
Pharmacy Antibiotic Note  Bradley Espinoza is a 63 y.o. male admitted on 12/19/2018 with wound infection.  History of right ankle surgery 11/26/18. Oral doxycycline begun after office visit on 12/17/18, but worsening of swelling and redness and discomfort. Admitted for IV antibiotics and Pharmacy consulted for vancomycin dosing.  S/p hardware removal and antibiotic beads placement on 12/23/18.  Preliminary tissue culture is growing Staph aureus and today is day #7 of antibiotics.  Plan: Continue vanc 1250mg  IV Q12H for AUC 513 Monitor renal fxn, clinical progress, check vanc levels   Temp (24hrs), Avg:98.4 F (36.9 C), Min:98.2 F (36.8 C), Max:98.5 F (36.9 C)  Recent Labs  Lab 12/19/18 1834 12/22/18 0439  WBC 6.9  --   CREATININE 0.85 0.79    Estimated Creatinine Clearance: 103.1 mL/min (by C-G formula based on SCr of 0.79 mg/dL).    Allergies  Allergen Reactions  . Codeine Nausea And Vomiting    Vanc 3/13>> Zosyn 3/13>>3/17  3/17 right ankle tissue - Staph aureus (preliminary)  Katelinn Justice D. Mina Marble, PharmD, BCPS, [place] 12/24/2018, 12:31 PM

## 2018-12-24 NOTE — Progress Notes (Signed)
Pt states oxycodone does not work for him. States only percocet works. Did explain percocet is oxycodone and tylenol. Pt immediately started shouting at this Probation officer. He is upset about his home meds v.s what is ordered in the hospital. Did attempt to explain purpose of change in medications but pt continues to rant about his dissatisfaction. Did ensure we will try to inform him of all meds given prior to administration. Notified MD.

## 2018-12-24 NOTE — Progress Notes (Signed)
Physical Therapy Treatment Patient Details Name: Bradley Espinoza MRN: [id number] DOB: Feb 25, 1956 Today's Date: 12/24/2018    History of Present Illness Patient is a 63 y/o male s/p R ankle hardware removal and placement of Abx beads and wound vac. PMH signficant for B calcaneal fx with surgery on R x5, depression, back surgery x3, anxiety.    PT Comments    Pt in bed upon arrival and reluctantly agreed to participate with therapy. Pt ambulated with three different assistive devices; a rolling walker, crutches and knee scooter with pt reporting that the crutches would be the best and were the easiest. Pt resistant to education provided by therapist stating that he has been doing this for two years and he already knows how to do everything. Pt is progressing well towards PT goals and is moving well however appears to have some decreased safety awareness. Pt would continue to benefit from skilled acute therapy to continue progressing functional mobility and activity tolerance.   Follow Up Recommendations  No PT follow up     Equipment Recommendations  None recommended by PT    Recommendations for Other Services       Precautions / Restrictions Precautions Precautions: Fall Required Braces or Orthoses: Other Brace Other Brace: CAM walker Restrictions Weight Bearing Restrictions: Yes RLE Weight Bearing: Non weight bearing    Mobility  Bed Mobility Overal bed mobility: Independent                Transfers Overall transfer level: Needs assistance Equipment used: Rolling walker (2 wheeled);Crutches(knee scooter) Transfers: Sit to/from Stand Sit to Stand: Supervision         General transfer comment: x4 trials from bed and recliner, pt supervision for safety and management of lines, required cuing for hand placement   Ambulation/Gait Ambulation/Gait assistance: Supervision Gait Distance (Feet): 75 Feet Assistive device: Rolling walker (2 wheeled);Crutches(knee  scooter) Gait Pattern/deviations: Step-to pattern Gait velocity: appropriate for NWB LE   General Gait Details: pt able to hop on L foot to keep R NWB, pt ambulated in room to/from restroom with RW, in hallway with knee scooter and then back to room with crutches, pt states crutches are easier than RW, pt appeared to benefit from using knee scooter however pt fixated on having knee scooter perfect height for him, pt required cuing and demonstration for locking knee scooter prior to using , pt reporting pain in left foot throughout session, chair follow for safety, pt required 2 seated rest breaks during ambulation   Stairs         General stair comments: pt did not want to do stairs today stating he has already done them and could teach [place] how to do stairs   Wheelchair Mobility    Modified Rankin (Stroke Patients Only)       Balance Overall balance assessment: Mild deficits observed, not formally tested(pt able to maintain balance at sink while brushing teeth and washing face)                                          Cognition Arousal/Alertness: Awake/alert Behavior During Therapy: WFL for tasks assessed/performed;Agitated;Flat affect Overall Cognitive Status: Within Functional Limits for tasks assessed  Exercises      General Comments        Pertinent Vitals/Pain Faces Pain Scale: Hurts whole lot Pain Location: R foot Pain Descriptors / Indicators: Discomfort;Guarding;Grimacing Pain Intervention(s): Limited activity within patient's tolerance;Monitored during session;Premedicated before session;Repositioned    Home Living                      Prior Function            PT Goals (current goals can now be found in the care plan section) Progress towards PT goals: Progressing toward goals    Frequency    Min 3X/week      PT Plan Current plan remains appropriate     Co-evaluation              AM-PAC PT "6 Clicks" Mobility   Outcome Measure  Help needed turning from your back to your side while in a flat bed without using bedrails?: None Help needed moving from lying on your back to sitting on the side of a flat bed without using bedrails?: None Help needed moving to and from a bed to a chair (including a wheelchair)?: A Little Help needed standing up from a chair using your arms (e.g., wheelchair or bedside chair)?: A Little Help needed to walk in hospital room?: A Little Help needed climbing 3-5 steps with a railing? : A Little 6 Click Score: 20    End of Session Equipment Utilized During Treatment: Gait belt Activity Tolerance: Patient tolerated treatment well Patient left: in bed;with call bell/phone within reach Nurse Communication: Mobility status PT Visit Diagnosis: Other abnormalities of gait and mobility (R26.89);Pain Pain - Right/Left: Right Pain - part of body: Ankle and joints of foot     Time: 1351-1428 PT Time Calculation (min) (ACUTE ONLY): 37 min  Charges:  $Gait Training: 23-37 mins                     [place], [place] [phone]    Bradley Espinoza 12/24/2018, 2:48 PM

## 2018-12-24 NOTE — Progress Notes (Signed)
Patient ID: Bradley Espinoza, male   DOB: 1956/09/15, 63 y.o.   MRN: [id number] Patient is postoperative day 1 status post irrigation and debridement and removal of the hardware for ankle fusion.  Antibiotic beads were placed with vancomycin and gentamicin.  Patient's wound cultures are showing gram-positive cocci infectious disease to determine if patient needs a PICC line or oral antibiotics.  Patient states that his preoperative pain has resolved he does have postoperative pain.  Patient states the feeling has come back in his big toe.  Possible discharge on Thursday.  Nonweightbearing on the right.

## 2018-12-24 NOTE — Progress Notes (Signed)
Patient ID: Bradley Espinoza, male   DOB: 11/03/1955, 63 y.o.   MRN: [id number]         [facility]  Date of Admission:  12/19/2018   Total days of antibiotic therapy 8        Day 6 vancomycin         ASSESSMENT: There was no evidence of bone infection at the time of his debridement yesterday.  Operative Gram stain reveals gram-positive cocci and cultures are still pending.  I will continue vancomycin and follow-up tomorrow.  PLAN: 1. Continue vancomycin pending final cultures  Principal Problem:   Wound infection following procedure Active Problems:   Nonunion of subtalar arthrodesis   S/P ankle fusion   Scheduled Meds: . aspirin  325 mg Oral Daily  . docusate sodium  100 mg Oral BID  . gabapentin  300 mg Oral BID   And  . gabapentin  600 mg Oral QHS  . pantoprazole  40 mg Oral Daily  . senna  1 tablet Oral BID  . tamsulosin  0.4 mg Oral Daily  . [START ON 12/30/2018] testosterone cypionate  200 mg Intramuscular Q14 Days   Continuous Infusions: . sodium chloride 50 mL/hr at 12/23/18 1229  . sodium chloride    . methocarbamol (ROBAXIN) IV    . vancomycin 1,250 mg (12/24/18 0856)   PRN Meds:.acetaminophen, acetaminophen, bisacodyl, diphenhydrAMINE, HYDROmorphone (DILAUDID) injection, magnesium citrate, methocarbamol **OR** methocarbamol (ROBAXIN) IV, metoCLOPramide **OR** metoCLOPramide (REGLAN) injection, ondansetron **OR** ondansetron (ZOFRAN) IV, oxyCODONE, oxyCODONE, oxyCODONE-acetaminophen, polyethylene glycol, zolpidem   SUBJECTIVE: He is not in much pain.  Review of Systems: Review of Systems  Constitutional: Negative for chills, diaphoresis and fever.  Gastrointestinal: Negative for abdominal pain, diarrhea, nausea and vomiting.  Musculoskeletal: Positive for joint pain.    Allergies  Allergen Reactions  . Codeine Nausea And Vomiting    OBJECTIVE: Vitals:   12/23/18 1947 12/24/18 0012 12/24/18 0400 12/24/18 0904  BP: (!)  166/74 (!) 144/66 (!) 152/58 (!) 145/70  Pulse: 66 65 84 68  Resp: (!) 1 18 18    Temp: 98.3 F (36.8 C) 98.5 F (36.9 C) 98.5 F (36.9 C) 98.4 F (36.9 C)  TempSrc: Oral Oral Oral Oral  SpO2: 99% 96% 97% 96%  Weight:      Height:       Body mass index is 22.43 kg/m.  Physical Exam Constitutional:      Comments: He is in no distress.  He is sitting up in a chair watching television.  Musculoskeletal:     Comments: He has a hard boot on his right lower leg.  Psychiatric:        Mood and Affect: Mood normal.     Lab Results Lab Results  Component Value Date   WBC 6.9 12/19/2018   HGB 11.8 (L) 12/19/2018   HCT 36.5 (L) 12/19/2018   MCV 90.6 12/19/2018   PLT 214 12/19/2018    Lab Results  Component Value Date   CREATININE 0.79 12/22/2018   BUN <5 (L) 12/22/2018   NA 137 12/22/2018   K 3.3 (L) 12/22/2018   CL 102 12/22/2018   CO2 27 12/22/2018    Lab Results  Component Value Date   ALT 12 12/08/2018   AST 17 12/08/2018   ALKPHOS 82 08/12/2015   BILITOT 0.7 12/08/2018     Microbiology: Recent Results (from the past 240 hour(s))  Surgical pcr screen     Status: None   Collection Time:  12/23/18  4:03 AM  Result Value Ref Range Status   MRSA, PCR NEGATIVE NEGATIVE Final   Staphylococcus aureus NEGATIVE NEGATIVE Final    Comment: (NOTE) The Xpert SA Assay (FDA approved for NASAL specimens in patients 1 years of age and older), is one component of a comprehensive surveillance program. It is not intended to diagnose infection nor to guide or monitor treatment. Performed at [facility], [place] [address] [place], [place] [address]   Aerobic/Anaerobic Culture (surgical/deep wound)     Status: None (Preliminary result)   Collection Time: 12/23/18  9:21 AM  Result Value Ref Range Status   Specimen Description TISSUE RIGHT ANKLE  Final   Special Requests PATIENT ON FOLLOWING VANC AND ZOSYN  Final   Gram Stain   Final    FEW WBC PRESENT,BOTH PMN AND  MONONUCLEAR RARE GRAM POSITIVE COCCI Performed at [facility], [place] [address], [place], [place] [address]    Culture FEW STAPHYLOCOCCUS AUREUS  Final   Report Status PENDING  Incomplete    Michel Bickers, MD [facility] [phone] pager   [phone] cell 12/24/2018, 10:54 AM

## 2018-12-24 NOTE — Care Management Important Message (Signed)
Important Message  Patient Details  Name: Bradley Espinoza MRN: [id number] Date of Birth: 08/14/1956   Medicare Important Message Given:  Yes    Orbie Pyo 12/24/2018, 11:34 AM

## 2018-12-25 ENCOUNTER — Telehealth (INDEPENDENT_AMBULATORY_CARE_PROVIDER_SITE_OTHER): Payer: Self-pay | Admitting: Orthopedic Surgery

## 2018-12-25 DIAGNOSIS — R110 Nausea: Secondary | ICD-10-CM

## 2018-12-25 DIAGNOSIS — B9561 Methicillin susceptible Staphylococcus aureus infection as the cause of diseases classified elsewhere: Secondary | ICD-10-CM

## 2018-12-25 LAB — BASIC METABOLIC PANEL
Anion gap: 9 (ref 5–15)
BUN: 13 mg/dL (ref 8–23)
CALCIUM: 8.7 mg/dL — AB (ref 8.9–10.3)
CO2: 27 mmol/L (ref 22–32)
Chloride: 105 mmol/L (ref 98–111)
Creatinine, Ser: 1.48 mg/dL — ABNORMAL HIGH (ref 0.61–1.24)
GFR calc Af Amer: 58 mL/min — ABNORMAL LOW (ref >60)
GFR calc non Af Amer: 50 mL/min — ABNORMAL LOW (ref >60)
GLUCOSE: 106 mg/dL — AB (ref 70–99)
Potassium: 3.4 mmol/L — ABNORMAL LOW (ref 3.5–5.1)
Sodium: 141 mmol/L (ref 135–145)

## 2018-12-25 MED ORDER — CEPHALEXIN 500 MG PO CAPS
500.0000 mg | ORAL_CAPSULE | Freq: Three times a day (TID) | ORAL | Status: DC
  Administered 2018-12-25: 500 mg via ORAL
  Filled 2018-12-25: qty 1

## 2018-12-25 MED ORDER — OXYCODONE-ACETAMINOPHEN 5-325 MG PO TABS: 1.0000 | ORAL_TABLET | ORAL | 0 refills | Status: DC | PRN

## 2018-12-25 MED ORDER — METHOCARBAMOL 500 MG PO TABS: 500.0000 mg | ORAL_TABLET | Freq: Three times a day (TID) | ORAL | 0 refills | Status: DC

## 2018-12-25 MED ORDER — ONDANSETRON 8 MG PO TBDP: 8.0000 mg | ORAL_TABLET | Freq: Three times a day (TID) | ORAL | 0 refills | Status: DC | PRN

## 2018-12-25 MED ORDER — CEPHALEXIN 500 MG PO CAPS: 500.0000 mg | ORAL_CAPSULE | Freq: Three times a day (TID) | ORAL | 0 refills | Status: DC

## 2018-12-25 MED ORDER — ONDANSETRON HCL 4 MG PO TABS: 8.0000 mg | ORAL_TABLET | Freq: Three times a day (TID) | ORAL | 0 refills | Status: AC | PRN

## 2018-12-25 NOTE — Discharge Summary (Signed)
Discharge Diagnoses:  Principal Problem:   Wound infection following procedure Active Problems:   Nonunion of subtalar arthrodesis   S/P ankle fusion   Surgeries: Procedure(s): RIGHT ANKLE REMOVE HARDWARE, PLACE ANTIBIOTIC BEADS and placement of wound vac Application Of Wound Vac on 12/23/2018    Consultants:   Discharged Condition: Improved  Hospital Course: Bradley Espinoza is an 63 y.o. male who was admitted 12/19/2018 with a chief complaint of infected deep retained hardware status post ankle fusion, with a final diagnosis of Infected Right Ankle Hardware.  Patient was brought to the operating room on 12/23/2018 and underwent Procedure(s): RIGHT ANKLE REMOVE HARDWARE, PLACE ANTIBIOTIC BEADS and placement of wound vac Application Of Wound Vac.    Patient was given perioperative antibiotics:  Anti-infectives (From admission, onward)   Start     Dose/Rate Route Frequency Ordered Stop   12/25/18 1400  cephALEXin (KEFLEX) capsule 500 mg     500 mg Oral Every 8 hours 12/25/18 1304     12/25/18 0000  cephALEXin (KEFLEX) 500 MG capsule     500 mg Oral 3 times daily 12/25/18 1028 01/08/19 2359   12/23/18 0907  vancomycin (VANCOCIN) powder  Status:  Discontinued       As needed 12/23/18 0907 12/23/18 1000   12/23/18 0907  gentamicin (GARAMYCIN) injection  Status:  Discontinued       As needed 12/23/18 0908 12/23/18 1000   12/23/18 0600  ceFAZolin (ANCEF) IVPB 2g/100 mL premix  Status:  Discontinued     2 g 200 mL/hr over 30 Minutes Intravenous On call to O.R. 12/22/18 1918 12/23/18 0902   12/20/18 0800  vancomycin (VANCOCIN) 1,250 mg in sodium chloride 0.9 % 250 mL IVPB  Status:  Discontinued     1,250 mg 166.7 mL/hr over 90 Minutes Intravenous Every 12 hours 12/19/18 1927 12/25/18 1103   12/19/18 2000  piperacillin-tazobactam (ZOSYN) IVPB 3.375 g  Status:  Discontinued     3.375 g 12.5 mL/hr over 240 Minutes Intravenous Every 8 hours 12/19/18 1924 12/23/18 1629   12/19/18 2000   vancomycin (VANCOCIN) 1,500 mg in sodium chloride 0.9 % 500 mL IVPB     1,500 mg 250 mL/hr over 120 Minutes Intravenous  Once 12/19/18 1927 12/21/18 0344    .  Patient was given sequential compression devices, early ambulation, and aspirin for DVT prophylaxis.  Recent vital signs:  Patient Vitals for the past 24 hrs:  BP Temp Temp src Pulse Resp SpO2  12/25/18 1246 (!) 144/77 98.7 F (37.1 C) Oral (!) 58 18 96 %  12/25/18 0839 (!) 137/118 98.8 F (37.1 C) Oral 66 18 96 %  12/25/18 0342 (!) 153/69 98.4 F (36.9 C) Oral (!) 56 18 94 %  12/24/18 1958 137/65 98 F (36.7 C) Oral 60 16 96 %  12/24/18 1636 139/65 98.1 F (36.7 C) Oral 66 - 98 %  .  Recent laboratory studies: No results found.  Discharge Medications:   Allergies as of 12/25/2018      Reactions   Codeine Nausea And Vomiting      Medication List    STOP taking these medications   doxycycline 100 MG tablet Commonly known as:  VIBRA-TABS   ibuprofen 200 MG tablet Commonly known as:  ADVIL,MOTRIN   meloxicam 15 MG tablet Commonly known as:  MOBIC   oxyCODONE-acetaminophen 10-325 MG tablet Commonly known as:  PERCOCET Replaced by:  oxyCODONE-acetaminophen 5-325 MG tablet     TAKE these medications   cephALEXin 500  MG capsule Commonly known as:  KEFLEX Take 1 capsule (500 mg total) by mouth 3 (three) times daily for 14 days.   COLACE PO Take 1 tablet by mouth daily as needed (constipation).   gabapentin 300 MG capsule Commonly known as:  NEURONTIN Take 300-600 mg by mouth See admin instructions. Take 300mg  by mouth in the morning and afternoon, take 600 mg at bedtime   methocarbamol 500 MG tablet Commonly known as:  Robaxin Take 1 tablet (500 mg total) by mouth 3 (three) times daily. What changed:    when to take this  reasons to take this  Another medication with the same name was removed. Continue taking this medication, and follow the directions you see here.   omeprazole 40 MG  capsule Commonly known as:  PRILOSEC Take 1 capsule (40 mg total) by mouth daily.   ondansetron 4 MG tablet Commonly known as:  ZOFRAN Take 2 tablets (8 mg total) by mouth every 8 (eight) hours as needed for up to 14 days for nausea or vomiting.   oxyCODONE-acetaminophen 5-325 MG tablet Commonly known as:  PERCOCET/ROXICET Take 1 tablet by mouth every 4 (four) hours as needed. Replaces:  oxyCODONE-acetaminophen 10-325 MG tablet   tamsulosin 0.4 MG Caps capsule Commonly known as:  FLOMAX Take 0.4 mg by mouth as needed (prostate).   testosterone cypionate 200 MG/ML injection Commonly known as:  DEPOTESTOSTERONE CYPIONATE Inject 200 mg into the muscle every 14 (fourteen) days.            Discharge Care Instructions  (From admission, onward)         Start     Ordered   12/25/18 0000  Non weight bearing    Question Answer Comment  Laterality right   Extremity Lower      12/25/18 1621          Diagnostic Studies: US Abdomen Complete W/elastography  Result Date: 12/15/2018 CLINICAL DATA:  Fatty liver.  Cholelithiasis. EXAM: ULTRASOUND ABDOMEN ULTRASOUND HEPATIC ELASTOGRAPHY TECHNIQUE: Sonography of the upper abdomen was performed. In addition, ultrasound elastography evaluation of the liver was performed. A region of interest was placed within the right lobe of the liver. Following application of a compressive sonographic pulse, shear waves were detected in the adjacent hepatic tissue and the shear wave velocity was calculated. Multiple assessments were performed at the selected site. Median shear wave velocity is correlated to a Metavir fibrosis score. COMPARISON:  None. FINDINGS: ULTRASOUND ABDOMEN Gallbladder: A 2.7 cm gallstone is seen. No evidence of gallbladder wall thickening or pericholecystic fluid. No sonographic Murphy sign noted by sonographer. Common bile duct: Diameter: 7 mm. No evidence intrahepatic biliary ductal dilatation. Liver: Diffusely increased echogenicity  of the hepatic parenchyma, consistent with hepatic steatosis. No hepatic mass identified. Portal vein is patent on color Doppler imaging with normal direction of blood flow towards the liver. IVC: No abnormality visualized. Pancreas: Not visualized due to overlying bowel gas. Spleen: Mildly enlarged, with length of 14.7 cm and calculated volume 783 mL. Right Kidney: Length: 11.0 cm. Echogenicity within normal limits. No mass or hydronephrosis visualized. Left Kidney: Length: 12.5 cm. Echogenicity within normal limits. No mass or hydronephrosis visualized. Abdominal aorta: No aneurysm visualized. Other findings: None. ULTRASOUND HEPATIC ELASTOGRAPHY Device: Siemens Helix VTQ Patient position: Left Lateral Decubitus Transducer 6C1 Number of measurements: 10 Hepatic segment:  8 Median velocity:   1.20 m/sec IQR: 0.22 IQR/Median velocity ratio: 0.18 Corresponding Metavir fibrosis score:  F2 + some F3 Risk of fibrosis: Moderate Limitations  of exam: Respiratory motion Please note that abnormal shear wave velocities may also be identified in clinical settings other than with hepatic fibrosis, such as: acute hepatitis, elevated right heart and central venous pressures including use of beta blockers, veno-occlusive disease (Budd-Chiari), infiltrative processes such as mastocytosis/amyloidosis/infiltrative tumor, extrahepatic cholestasis, in the post-prandial state, and liver transplantation. Correlation with patient history, laboratory data, and clinical condition recommended. IMPRESSION: ULTRASOUND ABDOMEN: Cholelithiasis. No sonographic signs of cholecystitis or biliary obstruction. Hepatic steatosis. Mild splenomegaly. ULTRASOUND HEPATIC ELASTOGRAPHY: Median hepatic shear wave velocity is calculated at 1.20 m/sec. Corresponding Metavir fibrosis score is F2 + some F3. Risk of fibrosis is Moderate. Follow-up: Additional testing appropriate Electronically Signed   By: Earle Gell M.D.   On: 12/15/2018 13:41   Xr Ankle  Complete Right  Result Date: 12/11/2018 2 view radiographs of the right ankle shows a stable tibial calcaneal fusion with anterior plate no complications no prominent screws or hardware.   Patient benefited maximally from their hospital stay and there were no complications.     Disposition: Discharge disposition: 01-Home or Self Care      Discharge Instructions    Call MD / Call 911   Complete by:  As directed    If you experience chest pain or shortness of breath, CALL 911 and be transported to the hospital emergency room.  If you develope a fever above 101 F, pus (white drainage) or increased drainage or redness at the wound, or calf pain, call your surgeon's office.   Constipation Prevention   Complete by:  As directed    Drink plenty of fluids.  Prune juice may be helpful.  You may use a stool softener, such as Colace (over the counter) 100 mg twice a day.  Use MiraLax (over the counter) for constipation as needed.   Diet - low sodium heart healthy   Complete by:  As directed    Elevate operative extremity   Complete by:  As directed    Increase activity slowly as tolerated   Complete by:  As directed    Negative Pressure Wound Therapy - Incisional   Complete by:  As directed    Attached wound VAC dressing to the portable Praveena wound VAC pump.  Show patient how to plug the unit in to maintain its charge.   Non weight bearing   Complete by:  As directed    Laterality:  right   Extremity:  Lower     Follow-up Information    Newt Minion, MD In 1 week.   Specialty:  Orthopedic Surgery Contact information: [address] [place] [address] [phone]            Signed: Newt Minion 12/25/2018, 4:22 PM

## 2018-12-25 NOTE — Progress Notes (Signed)
Patient ID: Bradley Espinoza, male   DOB: July 08, 1956, 63 y.o.   MRN: [id number]         [facility]  Date of Admission:  12/19/2018   Total days of antibiotic therapy 9        Day 7 vancomycin         ASSESSMENT: His operative culture has grown MSSA.  Recommend discharge on cephalexin 500 mg 3 times daily for 2 more weeks.  We will also give him and ceftriaxone to take if he has continued nausea.  He has agreed to pick up the prescriptions at pharmacy.  PLAN: 1. Recommend discharge on cephalexin 500 mg 3 times daily for 2 more weeks 2. Please call if we can be of further assistance  Principal Problem:   Wound infection following procedure Active Problems:   Nonunion of subtalar arthrodesis   S/P ankle fusion   Scheduled Meds: . aspirin  325 mg Oral Daily  . docusate sodium  100 mg Oral BID  . gabapentin  300 mg Oral BID   And  . gabapentin  600 mg Oral QHS  . pantoprazole  40 mg Oral Daily  . senna  1 tablet Oral BID  . tamsulosin  0.4 mg Oral Daily   Continuous Infusions: . sodium chloride 50 mL/hr at 12/23/18 1229  . sodium chloride    . methocarbamol (ROBAXIN) IV     PRN Meds:.acetaminophen, acetaminophen, bisacodyl, diphenhydrAMINE, HYDROmorphone (DILAUDID) injection, magnesium citrate, methocarbamol **OR** methocarbamol (ROBAXIN) IV, metoCLOPramide **OR** metoCLOPramide (REGLAN) injection, ondansetron **OR** ondansetron (ZOFRAN) IV, oxyCODONE-acetaminophen, polyethylene glycol, zolpidem   SUBJECTIVE: He is is feeling better and eager to go home.  He has had some very mild nausea this morning.  Review of Systems: Review of Systems  Constitutional: Negative for chills, diaphoresis and fever.  Gastrointestinal: Positive for nausea. Negative for abdominal pain, diarrhea and vomiting.  Musculoskeletal: Positive for joint pain.    Allergies  Allergen Reactions  . Codeine Nausea And Vomiting    OBJECTIVE: Vitals:   12/24/18 1636  12/24/18 1958 12/25/18 0342 12/25/18 0839  BP: 139/65 137/65 (!) 153/69 (!) 137/118  Pulse: 66 60 (!) 56 66  Resp:  16 18 18   Temp: 98.1 F (36.7 C) 98 F (36.7 C) 98.4 F (36.9 C) 98.8 F (37.1 C)  TempSrc: Oral Oral Oral Oral  SpO2: 98% 96% 94% 96%  Weight:      Height:       Body mass index is 22.43 kg/m.  Physical Exam Constitutional:      Comments: He is sitting up in a chair watching television.  Musculoskeletal:     Comments: He has a hard boot on his right lower leg.  Psychiatric:        Mood and Affect: Mood normal.     Lab Results Lab Results  Component Value Date   WBC 6.9 12/19/2018   HGB 11.8 (L) 12/19/2018   HCT 36.5 (L) 12/19/2018   MCV 90.6 12/19/2018   PLT 214 12/19/2018    Lab Results  Component Value Date   CREATININE 1.48 (H) 12/25/2018   BUN 13 12/25/2018   NA 141 12/25/2018   K 3.4 (L) 12/25/2018   CL 105 12/25/2018   CO2 27 12/25/2018    Lab Results  Component Value Date   ALT 12 12/08/2018   AST 17 12/08/2018   ALKPHOS 82 08/12/2015   BILITOT 0.7 12/08/2018     Microbiology: Recent Results (from the past  240 hour(s))  Surgical pcr screen     Status: None   Collection Time: 12/23/18  4:03 AM  Result Value Ref Range Status   MRSA, PCR NEGATIVE NEGATIVE Final   Staphylococcus aureus NEGATIVE NEGATIVE Final    Comment: (NOTE) The Xpert SA Assay (FDA approved for NASAL specimens in patients 63 years of age and older), is one component of a comprehensive surveillance program. It is not intended to diagnose infection nor to guide or monitor treatment. Performed at [facility], [place] [address] [place], [place] [address]   Aerobic/Anaerobic Culture (surgical/deep wound)     Status: None (Preliminary result)   Collection Time: 12/23/18  9:21 AM  Result Value Ref Range Status   Specimen Description TISSUE RIGHT ANKLE  Final   Special Requests PATIENT ON FOLLOWING VANC AND ZOSYN  Final   Gram Stain   Final    FEW WBC  PRESENT,BOTH PMN AND MONONUCLEAR RARE GRAM POSITIVE COCCI Performed at [facility], [place] [address] [place], [place] [address]    Culture   Final    FEW STAPHYLOCOCCUS AUREUS NO ANAEROBES ISOLATED; CULTURE IN PROGRESS FOR 5 DAYS    Report Status PENDING  Incomplete   Organism ID, Bacteria STAPHYLOCOCCUS AUREUS  Final      Susceptibility   Staphylococcus aureus - MIC*    CIPROFLOXACIN <=0.5 SENSITIVE Sensitive     ERYTHROMYCIN RESISTANT Resistant     GENTAMICIN <=0.5 SENSITIVE Sensitive     OXACILLIN 0.5 SENSITIVE Sensitive     TETRACYCLINE <=1 SENSITIVE Sensitive     VANCOMYCIN <=0.5 SENSITIVE Sensitive     TRIMETH/SULFA <=10 SENSITIVE Sensitive     CLINDAMYCIN RESISTANT Resistant     RIFAMPIN <=0.5 SENSITIVE Sensitive     Inducible Clindamycin POSITIVE Resistant     * FEW STAPHYLOCOCCUS AUREUS    Michel Bickers, MD [facility] [place] [facility] [phone] pager   [phone] cell 12/25/2018, 11:50 AM

## 2018-12-25 NOTE — Telephone Encounter (Signed)
Patient left a message stating that Dr. Sharol Given was suppose to D/C him today and that the nurses said he has not written that up yet.  Patient advised that he will walk out if Dr. Sharol Given does not D/C him today.  CB#[phone].  Thank you.

## 2018-12-25 NOTE — Progress Notes (Signed)
Patient ID: Bradley Espinoza, male   DOB: Dec 15, 1955, 63 y.o.   MRN: [id number] Patient is status post removal of deep retained hardware for infection and cellulitis.  Patient states his foot and ankle continues to improve.  There is 75 cc in the wound VAC canister.  Cultures are showing staph aureus.  Will discharge to home once the sensitivities are finalized and long-term antibiotic is established.

## 2018-12-25 NOTE — Plan of Care (Signed)

## 2018-12-25 NOTE — Plan of Care (Signed)
  Problem: Education: Goal: Knowledge of General Education information will improve Description Including pain rating scale, medication(s)/side effects and non-pharmacologic comfort measures Outcome: Adequate for Discharge   Problem: Health Behavior/Discharge Planning: Goal: Ability to manage health-related needs will improve Outcome: Adequate for Discharge   Problem: Clinical Measurements: Goal: Ability to maintain clinical measurements within normal limits will improve Outcome: Adequate for Discharge   Problem: Activity: Goal: Risk for activity intolerance will decrease Outcome: Adequate for Discharge   Problem: Pain Managment: Goal: General experience of comfort will improve Outcome: Adequate for Discharge   Problem: Safety: Goal: Ability to remain free from injury will improve Outcome: Adequate for Discharge   Problem: Clinical Measurements: Goal: Will remain free from infection Outcome: Completed/Met Goal: Diagnostic test results will improve Outcome: Completed/Met Goal: Respiratory complications will improve Outcome: Completed/Met Goal: Cardiovascular complication will be avoided Outcome: Completed/Met   Problem: Nutrition: Goal: Adequate nutrition will be maintained Outcome: Completed/Met   Problem: Coping: Goal: Level of anxiety will decrease Outcome: Completed/Met   Problem: Elimination: Goal: Will not experience complications related to bowel motility Outcome: Completed/Met Goal: Will not experience complications related to urinary retention Outcome: Completed/Met   Problem: Skin Integrity: Goal: Risk for impaired skin integrity will decrease Outcome: Completed/Met

## 2018-12-26 DIAGNOSIS — E291 Testicular hypofunction: Secondary | ICD-10-CM | POA: Diagnosis not present

## 2018-12-26 DIAGNOSIS — T8140XA Infection following a procedure, unspecified, initial encounter: Secondary | ICD-10-CM | POA: Diagnosis not present

## 2018-12-26 DIAGNOSIS — Z1159 Encounter for screening for other viral diseases: Secondary | ICD-10-CM | POA: Diagnosis not present

## 2018-12-26 NOTE — Telephone Encounter (Signed)
Dr. Sharol Given spoke with nurse last night

## 2018-12-28 LAB — AEROBIC/ANAEROBIC CULTURE W GRAM STAIN (SURGICAL/DEEP WOUND)

## 2018-12-28 LAB — AEROBIC/ANAEROBIC CULTURE (SURGICAL/DEEP WOUND)

## 2018-12-30 ENCOUNTER — Telehealth (INDEPENDENT_AMBULATORY_CARE_PROVIDER_SITE_OTHER): Payer: Self-pay

## 2018-12-30 NOTE — Telephone Encounter (Signed)
Patient was called and answered all screening questions and stated No to all questions. Patient will be here for appt.

## 2018-12-31 ENCOUNTER — Other Ambulatory Visit: Payer: Self-pay

## 2018-12-31 ENCOUNTER — Encounter (INDEPENDENT_AMBULATORY_CARE_PROVIDER_SITE_OTHER): Payer: Self-pay | Admitting: Orthopedic Surgery

## 2018-12-31 ENCOUNTER — Ambulatory Visit (INDEPENDENT_AMBULATORY_CARE_PROVIDER_SITE_OTHER): Payer: PPO | Admitting: Orthopedic Surgery

## 2018-12-31 VITALS — Ht 73.0 in | Wt 170.0 lb

## 2018-12-31 DIAGNOSIS — Z981 Arthrodesis status: Secondary | ICD-10-CM

## 2018-12-31 DIAGNOSIS — T8149XA Infection following a procedure, other surgical site, initial encounter: Secondary | ICD-10-CM

## 2019-01-01 ENCOUNTER — Encounter (INDEPENDENT_AMBULATORY_CARE_PROVIDER_SITE_OTHER): Payer: Self-pay | Admitting: Orthopedic Surgery

## 2019-01-01 ENCOUNTER — Inpatient Hospital Stay (INDEPENDENT_AMBULATORY_CARE_PROVIDER_SITE_OTHER): Payer: PPO | Admitting: Orthopedic Surgery

## 2019-01-01 NOTE — Progress Notes (Signed)
Office Visit Note   Patient: Bradley Espinoza           Date of Birth: 1956-06-24           MRN: [id number] Visit Date: 12/31/2018              Requested by: Celene Squibb, MD [place], [place] [address] PCP: Celene Squibb, MD  Chief Complaint  Patient presents with  . Right Ankle - Routine Post Op    12/23/18 right ankle removal of HDW placement of ABX beads.       HPI: Patient is a 63 year old gentleman who presents 1 week status post removal of deep retained hardware placement of antibiotic beads for a infected ankle fusion.  Patient presents stating that the [facility] alarm showed a leak.  Assessment & Plan: Visit Diagnoses:  1. Wound infection after surgery   2. S/P ankle fusion     Plan: Patient was provided a medical compression stocking he will change this as needed we will continue the fracture boot nonweightbearing on the right.  Follow-Up Instructions: Return in about 1 week (around 01/07/2019).   Ortho Exam  Patient is alert, oriented, no adenopathy, well-dressed, normal affect, normal respiratory effort. Examination the VAC is removed there is some skin maceration from drainage the wound edges are well approximated there is no redness no cellulitis there is some clear drainage.  No signs of infection.  Imaging: No results found. No images are attached to the encounter.  Labs: Lab Results  Component Value Date   HGBA1C 5.1 06/26/2016   ESRSEDRATE 9 12/08/2018   REPTSTATUS 12/28/2018 FINAL 12/23/2018   GRAMSTAIN  12/23/2018    FEW WBC PRESENT,BOTH PMN AND MONONUCLEAR RARE GRAM POSITIVE COCCI Performed at [facility], [address] [place], [place] [address]    CULT  12/23/2018    FEW STAPHYLOCOCCUS AUREUS NO ANAEROBES ISOLATED; CULTURE IN PROGRESS FOR 5 DAYS    LABORGA STAPHYLOCOCCUS AUREUS 12/23/2018     Lab Results  Component Value Date   ALBUMIN 4.3 08/12/2015   ALBUMIN 4.1 05/06/2015   ALBUMIN 4.0 09/15/2014    Body  mass index is 22.43 kg/m.  Orders:  No orders of the defined types were placed in this encounter.  No orders of the defined types were placed in this encounter.    Procedures: No procedures performed  Clinical Data: No additional findings.  ROS:  All other systems negative, except as noted in the HPI. Review of Systems  Objective: Vital Signs: Ht 6\' 1"  (1.854 m)   Wt 170 lb (77.1 kg)   BMI 22.43 kg/m   Specialty Comments:  No specialty comments available.  PMFS History: Patient Active Problem List   Diagnosis Date Noted  . Wound infection following procedure 12/19/2018  . Malunion of joint fusion ([place]) 11/26/2018  . 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H[facility])   . Status post lumbar spinal fusion 09/10/2018  . S/P ankle fusion 11/27/2017  . Nonunion of subtalar arthrodesis   . Avascular necrosis of talus, right ([place])   . Post-traumatic osteoarthritis, left ankle and foot 10/22/2017  . DDD (degenerative disc disease), lumbar 04/05/2017  . Fracture of L2 vertebra ([place]) 01/12/2015  . Acute osteomyelitis of calcaneum ([place]) 10/09/2014  . Dysuria 10/05/2014  . Cellulitis 10/05/2014  . Fall from ladder 09/21/2014  . L2 vertebral fracture ([place]) 09/21/2014  . Bilateral calcaneal fractures 09/21/2014  . Chronic pain 09/21/2014  . Acute blood loss anemia 09/21/2014  .  Open fracture of both calcanei 09/15/2014   Past Medical History:  Diagnosis Date  . Anxiety   . Arthritis    low back pain, lumbar radiculopathy  . Depression   . Fracture    B/L ankles  . GERD (gastroesophageal reflux disease)   . Headache(784.0)    allergy related   . History of kidney stones   . History of stomach ulcers   . Retained orthopedic hardware    failed retained hardware right foot  . Wears glasses     Family History  Problem Relation Age of Onset  . Diabetes Father   . Cancer Other     Past Surgical History:  Procedure Laterality Date  . ANKLE FUSION Right 05/11/2015   Procedure: Right  Posterior Arthroscopic Subtalar Arthrodesis;  Surgeon: Newt Minion, MD;  Location: [facility];  Service: Orthopedics;  Laterality: Right;  . ANKLE FUSION Right 11/27/2017   Procedure: RIGHT TIBIOCALCANEAL FUSION;  Surgeon: Newt Minion, MD;  Location: [facility];  Service: Orthopedics;  Laterality: Right;  . ANKLE FUSION Right 11/26/2018   Procedure: RIGHT ANTERIOR ANKLE FUSION, APPLY VAC;  Surgeon: Newt Minion, MD;  Location: [facility];  Service: Orthopedics;  Laterality: Right;  . ANTERIOR LAT LUMBAR FUSION N/A 04/05/2017   Procedure: Extreme Lateral Interbody Fusion - Lumbar three-lumbar four ,exploration of fusion Posterior augmentation with globus addition Removal hardware Lumbar one-three. Lumbar four-sacral one,  Removal internal bone growth stimulator;  Surgeon: Kary Kos, MD;  Location: [facility];  Service: Neurosurgery;  Laterality: N/A;  . APPLICATION OF WOUND VAC  12/23/2018   Procedure: Application Of Wound Vac;  Surgeon: Newt Minion, MD;  Location: [facility];  Service: Orthopedics;;  . BACK SURGERY  2004   x 2  . CERVICAL SPINE SURGERY  2008  . ESOPHAGOGASTRODUODENOSCOPY    . HARDWARE REMOVAL Right 10/09/2014   Procedure: Removal Deep Hardware, Irrigation and Debridement Calcaneus, Place Antibiotic Beads and Wound VAC ;  Surgeon: Newt Minion, MD;  Location: [facility];  Service: Orthopedics;  Laterality: Right;  . HARDWARE REMOVAL Right 08/12/2015   Procedure: Removal Deep Hardware Right Foot;  Surgeon: Newt Minion, MD;  Location: [facility];  Service: Orthopedics;  Laterality: Right;  . HARDWARE REMOVAL Right 11/26/2018   Procedure: REMOVAL RIGHT TIBIOCALCANEAL NAIL;  Surgeon: Newt Minion, MD;  Location: [facility];  Service: Orthopedics;  Laterality: Right;  . HARDWARE REMOVAL Right 12/23/2018   Procedure: RIGHT ANKLE REMOVE HARDWARE, PLACE ANTIBIOTIC BEADS and placement of wound vac;  Surgeon: Newt Minion, MD;  Location: [facility];  Service: Orthopedics;  Laterality: Right;  . I&D EXTREMITY Right 09/15/2014    Procedure: IRRIGATION AND DEBRIDEMENT Ankle;  Surgeon: Renette Butters, MD;  Location: [facility];  Service: Orthopedics;  Laterality: Right;  . INGUINAL HERNIA REPAIR Bilateral   . LAMINECTOMY WITH POSTERIOR LATERAL ARTHRODESIS LEVEL 4 N/A 09/10/2018   Procedure: Posterior Lateral Fusion - Thoracic Eleven-Thoracic Twelve - Thoracic Twelve-Lumbar One - Lumbar One-Lumbar Two - Lumbar Two-Lumbar Three with instrumentaion and PLA;  Surgeon: Kary Kos, MD;  Location: [facility];  Service: Neurosurgery;  Laterality: N/A;  Posterior Lateral Fusion - Thoracic Eleven-Thoracic Twelve - Thoracic Twelve-Lumbar One - Lumbar One-Lumbar Two - Lumbar Two-Lumbar Thre  . LUMBAR PERCUTANEOUS PEDICLE SCREW 1 LEVEL N/A 04/05/2017   Procedure: LUMBAR PERCUTANEOUS PEDICLE SCREW LUMBAR THREE-FOUR;  Surgeon: Kary Kos, MD;  Location: [facility];  Service: Neurosurgery;  Laterality: N/A;  . ORIF CALCANEOUS FRACTURE Right 09/19/2014  Procedure: OPEN REDUCTION INTERNAL FIXATION (ORIF) CALCANEOUS FRACTURE;  Surgeon: Newt Minion, MD;  Location: [facility];  Service: Orthopedics;  Laterality: Right;  . ORIF CALCANEOUS FRACTURE Left 09/19/2014   Procedure: OPEN REDUCTION INTERNAL FIXATION (ORIF) CALCANEOUS FRACTURE;  Surgeon: Newt Minion, MD;  Location: [facility];  Service: Orthopedics;  Laterality: Left;   Social History   Occupational History    Comment: disabled  Tobacco Use  . Smoking status: Current Some Day Smoker    Packs/day: 1.00    Years: 10.00    Pack years: 10.00    Types: Cigars    Start date: 10/08/1976    Last attempt to quit: 09/07/2014    Years since quitting: 4.3  . Smokeless tobacco: Never Used  . Tobacco comment: 06/26/16 reports smokes a cigar 2 times a week at the most  Substance and Sexual Activity  . Alcohol use: No    Comment: 06/26/16 quit 1998  . Drug use: No  . Sexual activity: Yes    Birth control/protection: None

## 2019-01-02 ENCOUNTER — Telehealth (INDEPENDENT_AMBULATORY_CARE_PROVIDER_SITE_OTHER): Payer: Self-pay

## 2019-01-02 NOTE — Telephone Encounter (Signed)
I called pt to prescreen for COVID-19 and answered no to all questions.

## 2019-01-05 ENCOUNTER — Other Ambulatory Visit (INDEPENDENT_AMBULATORY_CARE_PROVIDER_SITE_OTHER): Payer: Self-pay | Admitting: Orthopedic Surgery

## 2019-01-05 ENCOUNTER — Encounter (INDEPENDENT_AMBULATORY_CARE_PROVIDER_SITE_OTHER): Payer: Self-pay | Admitting: Orthopedic Surgery

## 2019-01-05 ENCOUNTER — Other Ambulatory Visit: Payer: Self-pay

## 2019-01-05 ENCOUNTER — Telehealth (INDEPENDENT_AMBULATORY_CARE_PROVIDER_SITE_OTHER): Payer: Self-pay | Admitting: Orthopedic Surgery

## 2019-01-05 ENCOUNTER — Other Ambulatory Visit (INDEPENDENT_AMBULATORY_CARE_PROVIDER_SITE_OTHER): Payer: Self-pay

## 2019-01-05 ENCOUNTER — Ambulatory Visit (INDEPENDENT_AMBULATORY_CARE_PROVIDER_SITE_OTHER): Payer: PPO | Admitting: Physician Assistant

## 2019-01-05 VITALS — Ht 73.0 in | Wt 170.0 lb

## 2019-01-05 DIAGNOSIS — T8149XA Infection following a procedure, other surgical site, initial encounter: Secondary | ICD-10-CM

## 2019-01-05 DIAGNOSIS — Z981 Arthrodesis status: Secondary | ICD-10-CM

## 2019-01-05 MED ORDER — CEPHALEXIN 500 MG PO CAPS: 500.0000 mg | ORAL_CAPSULE | Freq: Three times a day (TID) | ORAL | 0 refills | Status: DC

## 2019-01-05 MED ORDER — CEPHALEXIN 500 MG PO CAPS: 500.0000 mg | ORAL_CAPSULE | Freq: Three times a day (TID) | ORAL | 0 refills | Status: AC

## 2019-01-05 NOTE — Telephone Encounter (Signed)
Patient called asked if he need to get more antibiotics. Patient said he has enough for  2 days. The number to contact patient is [phone]

## 2019-01-05 NOTE — Telephone Encounter (Signed)
Pt was in the office today. Questions if he needs refill on ABX. He only has enough for the next 2 days. Please advise.

## 2019-01-05 NOTE — Telephone Encounter (Signed)
rx sent to [facility] for refill of keflex

## 2019-01-05 NOTE — Progress Notes (Signed)
Office Visit Note   Patient: Bradley Espinoza           Date of Birth: 1956/03/03           MRN: [id number] Visit Date: 01/05/2019              Requested by: Celene Squibb, MD [place], [place] [address] PCP: Celene Squibb, MD  Chief Complaint  Patient presents with  . Right Ankle - Routine Post Op    12/23/18 right ankle removal of HDW and placement ABX beads       HPI: The patient is a 63 yo gentleman who is seen for post operative follow up following removal of right ankle hardware and placement of antibiotic beads for infection following revision ankle fusion 12/23/2018. Operative cultures grew MSSA. He reports his pain continues to be improved overall. He is wearing a fracture boot and non weight bearing. He does not some drainage over the incision and some whitish drainage consistent with extrusion of the antibiotic beads. He continues on Keflex through 01/08/2019.   Assessment & Plan: Visit Diagnoses:  1. Wound infection after surgery   2. S/P ankle fusion     Plan: May shower and use Dial soap and water the area and then apply dry gauze dressing, ace wrap and fracture boot. Continue Keflex.  Sutures were left in place this week. He will follow up in 1 week for re-assessment for suture removal and casting following suture removal   Follow-Up Instructions: Return in about 1 week (around 01/12/2019).   Ortho Exam  Patient is alert, oriented, no adenopathy, well-dressed, normal affect, normal respiratory effort. The right ankle incision- sutures intact with minimal serosanguinous drainage and some white material consistent with the antibiotics beads. Moderate edema about the ankle. No erythema. No odor. Sutures were left in place.  Imaging: No results found. No images are attached to the encounter.  Labs: Lab Results  Component Value Date   HGBA1C 5.1 06/26/2016   ESRSEDRATE 9 12/08/2018   REPTSTATUS 12/28/2018 FINAL 12/23/2018   GRAMSTAIN  12/23/2018     FEW WBC PRESENT,BOTH PMN AND MONONUCLEAR RARE GRAM POSITIVE COCCI Performed at [facility], [address] [place], [place] [address]    CULT  12/23/2018    FEW STAPHYLOCOCCUS AUREUS NO ANAEROBES ISOLATED; CULTURE IN PROGRESS FOR 5 DAYS    LABORGA STAPHYLOCOCCUS AUREUS 12/23/2018     Lab Results  Component Value Date   ALBUMIN 4.3 08/12/2015   ALBUMIN 4.1 05/06/2015   ALBUMIN 4.0 09/15/2014    Body mass index is 22.43 kg/m.  Orders:  No orders of the defined types were placed in this encounter.  No orders of the defined types were placed in this encounter.    Procedures: No procedures performed  Clinical Data: No additional findings.  ROS:  All other systems negative, except as noted in the HPI. Review of Systems  Objective: Vital Signs: Ht 6\' 1"  (1.854 m)   Wt 170 lb (77.1 kg)   BMI 22.43 kg/m   Specialty Comments:  No specialty comments available.  PMFS History: Patient Active Problem List   Diagnosis Date Noted  . Wound infection following procedure 12/19/2018  . Malunion of joint fusion ([place]) 11/26/2018  . 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[facility])   . Status post lumbar spinal fusion 09/10/2018  . S/P ankle fusion 11/27/2017  . Nonunion of subtalar arthrodesis   . Avascular necrosis of talus, right ([place])   .  Post-traumatic osteoarthritis, left ankle and foot 10/22/2017  . DDD (degenerative disc disease), lumbar 04/05/2017  . Fracture of L2 vertebra ([place]) 01/12/2015  . Acute osteomyelitis of calcaneum ([place]) 10/09/2014  . Dysuria 10/05/2014  . Cellulitis 10/05/2014  . Fall from ladder 09/21/2014  . L2 vertebral fracture ([place]) 09/21/2014  . Bilateral calcaneal fractures 09/21/2014  . Chronic pain 09/21/2014  . Acute blood loss anemia 09/21/2014  . Open fracture of both calcanei 09/15/2014   Past Medical History:  Diagnosis Date  . Anxiety   . Arthritis    low back pain, lumbar radiculopathy  . Depression   . Fracture    B/L ankles  . GERD  (gastroesophageal reflux disease)   . Headache(784.0)    allergy related   . History of kidney stones   . History of stomach ulcers   . Retained orthopedic hardware    failed retained hardware right foot  . Wears glasses     Family History  Problem Relation Age of Onset  . Diabetes Father   . Cancer Other     Past Surgical History:  Procedure Laterality Date  . ANKLE FUSION Right 05/11/2015   Procedure: Right Posterior Arthroscopic Subtalar Arthrodesis;  Surgeon: Newt Minion, MD;  Location: [facility];  Service: Orthopedics;  Laterality: Right;  . ANKLE FUSION Right 11/27/2017   Procedure: RIGHT TIBIOCALCANEAL FUSION;  Surgeon: Newt Minion, MD;  Location: [facility];  Service: Orthopedics;  Laterality: Right;  . ANKLE FUSION Right 11/26/2018   Procedure: RIGHT ANTERIOR ANKLE FUSION, APPLY VAC;  Surgeon: Newt Minion, MD;  Location: [facility];  Service: Orthopedics;  Laterality: Right;  . ANTERIOR LAT LUMBAR FUSION N/A 04/05/2017   Procedure: Extreme Lateral Interbody Fusion - Lumbar three-lumbar four ,exploration of fusion Posterior augmentation with globus addition Removal hardware Lumbar one-three. Lumbar four-sacral one,  Removal internal bone growth stimulator;  Surgeon: Kary Kos, MD;  Location: [facility];  Service: Neurosurgery;  Laterality: N/A;  . APPLICATION OF WOUND VAC  12/23/2018   Procedure: Application Of Wound Vac;  Surgeon: Newt Minion, MD;  Location: [facility];  Service: Orthopedics;;  . BACK SURGERY  2004   x 2  . CERVICAL SPINE SURGERY  2008  . ESOPHAGOGASTRODUODENOSCOPY    . HARDWARE REMOVAL Right 10/09/2014   Procedure: Removal Deep Hardware, Irrigation and Debridement Calcaneus, Place Antibiotic Beads and Wound VAC ;  Surgeon: Newt Minion, MD;  Location: [facility];  Service: Orthopedics;  Laterality: Right;  . HARDWARE REMOVAL Right 08/12/2015   Procedure: Removal Deep Hardware Right Foot;  Surgeon: Newt Minion, MD;  Location: [facility];  Service: Orthopedics;  Laterality: Right;  .  HARDWARE REMOVAL Right 11/26/2018   Procedure: REMOVAL RIGHT TIBIOCALCANEAL NAIL;  Surgeon: Newt Minion, MD;  Location: [facility];  Service: Orthopedics;  Laterality: Right;  . HARDWARE REMOVAL Right 12/23/2018   Procedure: RIGHT ANKLE REMOVE HARDWARE, PLACE ANTIBIOTIC BEADS and placement of wound vac;  Surgeon: Newt Minion, MD;  Location: [facility];  Service: Orthopedics;  Laterality: Right;  . I&D EXTREMITY Right 09/15/2014   Procedure: IRRIGATION AND DEBRIDEMENT Ankle;  Surgeon: Renette Butters, MD;  Location: [facility];  Service: Orthopedics;  Laterality: Right;  . INGUINAL HERNIA REPAIR Bilateral   . LAMINECTOMY WITH POSTERIOR LATERAL ARTHRODESIS LEVEL 4 N/A 09/10/2018   Procedure: Posterior Lateral Fusion - Thoracic Eleven-Thoracic Twelve - Thoracic Twelve-Lumbar One - Lumbar One-Lumbar Two - Lumbar Two-Lumbar Three with instrumentaion and PLA;  Surgeon: Kary Kos,  MD;  Location: [facility];  Service: Neurosurgery;  Laterality: N/A;  Posterior Lateral Fusion - Thoracic Eleven-Thoracic Twelve - Thoracic Twelve-Lumbar One - Lumbar One-Lumbar Two - Lumbar Two-Lumbar Thre  . LUMBAR PERCUTANEOUS PEDICLE SCREW 1 LEVEL N/A 04/05/2017   Procedure: LUMBAR PERCUTANEOUS PEDICLE SCREW LUMBAR THREE-FOUR;  Surgeon: Kary Kos, MD;  Location: [facility];  Service: Neurosurgery;  Laterality: N/A;  . ORIF CALCANEOUS FRACTURE Right 09/19/2014   Procedure: OPEN REDUCTION INTERNAL FIXATION (ORIF) CALCANEOUS FRACTURE;  Surgeon: Newt Minion, MD;  Location: [facility];  Service: Orthopedics;  Laterality: Right;  . ORIF CALCANEOUS FRACTURE Left 09/19/2014   Procedure: OPEN REDUCTION INTERNAL FIXATION (ORIF) CALCANEOUS FRACTURE;  Surgeon: Newt Minion, MD;  Location: [facility];  Service: Orthopedics;  Laterality: Left;   Social History   Occupational History    Comment: disabled  Tobacco Use  . Smoking status: Current Some Day Smoker    Packs/day: 1.00    Years: 10.00    Pack years: 10.00    Types: Cigars    Start date: 10/08/1976     Last attempt to quit: 09/07/2014    Years since quitting: 4.3  . Smokeless tobacco: Never Used  . Tobacco comment: 06/26/16 reports smokes a cigar 2 times a week at the most  Substance and Sexual Activity  . Alcohol use: No    Comment: 06/26/16 quit 1998  . Drug use: No  . Sexual activity: Yes    Birth control/protection: None

## 2019-01-05 NOTE — Telephone Encounter (Signed)
I called pt and he states that he uses [facility] not [facility]. I advised that I would resubmit the rx to the correct pharm and remove the [facility] from his chart. To call with any questions.

## 2019-01-08 ENCOUNTER — Telehealth (INDEPENDENT_AMBULATORY_CARE_PROVIDER_SITE_OTHER): Payer: Self-pay | Admitting: Radiology

## 2019-01-08 NOTE — Telephone Encounter (Signed)
Called and spoke with patient. Patient answered NO to all pre screening questions for appointment on 4/6

## 2019-01-08 NOTE — Telephone Encounter (Signed)
Called patient to advise. He states he will call in the AM to see how everything feels then. He wants to hold off.

## 2019-01-08 NOTE — Telephone Encounter (Signed)
See message.

## 2019-01-08 NOTE — Telephone Encounter (Signed)
Have him come into office tomorrow if he feels he can wait, if not he will have to go to the ED tonight .

## 2019-01-12 ENCOUNTER — Ambulatory Visit (INDEPENDENT_AMBULATORY_CARE_PROVIDER_SITE_OTHER): Payer: PPO | Admitting: Orthopedic Surgery

## 2019-01-12 ENCOUNTER — Encounter (INDEPENDENT_AMBULATORY_CARE_PROVIDER_SITE_OTHER): Payer: Self-pay | Admitting: Orthopedic Surgery

## 2019-01-12 ENCOUNTER — Other Ambulatory Visit: Payer: Self-pay

## 2019-01-12 VITALS — Ht 73.0 in | Wt 170.0 lb

## 2019-01-12 DIAGNOSIS — E663 Overweight: Secondary | ICD-10-CM | POA: Diagnosis not present

## 2019-01-12 DIAGNOSIS — D539 Nutritional anemia, unspecified: Secondary | ICD-10-CM | POA: Diagnosis not present

## 2019-01-12 DIAGNOSIS — Z0000 Encounter for general adult medical examination without abnormal findings: Secondary | ICD-10-CM | POA: Diagnosis not present

## 2019-01-12 DIAGNOSIS — Z6826 Body mass index (BMI) 26.0-26.9, adult: Secondary | ICD-10-CM | POA: Diagnosis not present

## 2019-01-12 DIAGNOSIS — Z981 Arthrodesis status: Secondary | ICD-10-CM

## 2019-01-12 DIAGNOSIS — I10 Essential (primary) hypertension: Secondary | ICD-10-CM | POA: Diagnosis not present

## 2019-01-12 DIAGNOSIS — T148XXA Other injury of unspecified body region, initial encounter: Secondary | ICD-10-CM | POA: Diagnosis not present

## 2019-01-20 ENCOUNTER — Encounter (INDEPENDENT_AMBULATORY_CARE_PROVIDER_SITE_OTHER): Payer: Self-pay | Admitting: Orthopedic Surgery

## 2019-01-20 NOTE — Progress Notes (Signed)
Office Visit Note   Patient: Bradley Espinoza           Date of Birth: May 02, 1956           MRN: [id number] Visit Date: 01/12/2019              Requested by: Celene Squibb, MD [place], [place] [address] PCP: Celene Squibb, MD  Chief Complaint  Patient presents with  . Right Ankle - Routine Post Op      HPI: Patient presents in follow-up status post infected ankle fusion with removal of hardware.  Assessment & Plan: Visit Diagnoses:  1. S/P ankle fusion     Plan: We will place him in a short leg cast weightbearing as tolerated  Follow-Up Instructions: Return in about 3 weeks (around 02/02/2019).   Ortho Exam  Patient is alert, oriented, no adenopathy, well-dressed, normal affect, normal respiratory effort. Examination the incision is well-healed there is no cellulitis no drainage no signs of infection there is minimal swelling.  Imaging: No results found. No images are attached to the encounter.  Labs: Lab Results  Component Value Date   HGBA1C 5.1 06/26/2016   ESRSEDRATE 9 12/08/2018   REPTSTATUS 12/28/2018 FINAL 12/23/2018   GRAMSTAIN  12/23/2018    FEW WBC PRESENT,BOTH PMN AND MONONUCLEAR RARE GRAM POSITIVE COCCI Performed at [facility], [address] [place], [place] [address]    CULT  12/23/2018    FEW STAPHYLOCOCCUS AUREUS NO ANAEROBES ISOLATED; CULTURE IN PROGRESS FOR 5 DAYS    LABORGA STAPHYLOCOCCUS AUREUS 12/23/2018     Lab Results  Component Value Date   ALBUMIN 4.3 08/12/2015   ALBUMIN 4.1 05/06/2015   ALBUMIN 4.0 09/15/2014    Body mass index is 22.43 kg/m.  Orders:  No orders of the defined types were placed in this encounter.  No orders of the defined types were placed in this encounter.    Procedures: No procedures performed  Clinical Data: No additional findings.  ROS:  All other systems negative, except as noted in the HPI. Review of Systems  Objective: Vital Signs: Ht 6\' 1"  (1.854 m)    Wt 170 lb (77.1 kg)   BMI 22.43 kg/m   Specialty Comments:  No specialty comments available.  PMFS History: Patient Active Problem List   Diagnosis Date Noted  . Wound infection following procedure 12/19/2018  . Malunion of joint fusion ([place]) 11/26/2018  . 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[facility])   . Status post lumbar spinal fusion 09/10/2018  . S/P ankle fusion 11/27/2017  . Nonunion of subtalar arthrodesis   . Avascular necrosis of talus, right ([place])   . Post-traumatic osteoarthritis, left ankle and foot 10/22/2017  . DDD (degenerative disc disease), lumbar 04/05/2017  . Fracture of L2 vertebra ([place]) 01/12/2015  . Acute osteomyelitis of calcaneum ([place]) 10/09/2014  . Dysuria 10/05/2014  . Cellulitis 10/05/2014  . Fall from ladder 09/21/2014  . L2 vertebral fracture ([place]) 09/21/2014  . Bilateral calcaneal fractures 09/21/2014  . Chronic pain 09/21/2014  . Acute blood loss anemia 09/21/2014  . Open fracture of both calcanei 09/15/2014   Past Medical History:  Diagnosis Date  . Anxiety   . Arthritis    low back pain, lumbar radiculopathy  . Depression   . Fracture    B/L ankles  . GERD (gastroesophageal reflux disease)   . Headache(784.0)    allergy related   . History of kidney stones   . History of stomach  ulcers   . Retained orthopedic hardware    failed retained hardware right foot  . Wears glasses     Family History  Problem Relation Age of Onset  . Diabetes Father   . Cancer Other     Past Surgical History:  Procedure Laterality Date  . ANKLE FUSION Right 05/11/2015   Procedure: Right Posterior Arthroscopic Subtalar Arthrodesis;  Surgeon: Newt Minion, MD;  Location: [facility];  Service: Orthopedics;  Laterality: Right;  . ANKLE FUSION Right 11/27/2017   Procedure: RIGHT TIBIOCALCANEAL FUSION;  Surgeon: Newt Minion, MD;  Location: [facility];  Service: Orthopedics;  Laterality: Right;  . ANKLE FUSION Right 11/26/2018   Procedure: RIGHT ANTERIOR ANKLE FUSION, APPLY VAC;   Surgeon: Newt Minion, MD;  Location: [facility];  Service: Orthopedics;  Laterality: Right;  . ANTERIOR LAT LUMBAR FUSION N/A 04/05/2017   Procedure: Extreme Lateral Interbody Fusion - Lumbar three-lumbar four ,exploration of fusion Posterior augmentation with globus addition Removal hardware Lumbar one-three. Lumbar four-sacral one,  Removal internal bone growth stimulator;  Surgeon: Kary Kos, MD;  Location: [facility];  Service: Neurosurgery;  Laterality: N/A;  . APPLICATION OF WOUND VAC  12/23/2018   Procedure: Application Of Wound Vac;  Surgeon: Newt Minion, MD;  Location: [facility];  Service: Orthopedics;;  . BACK SURGERY  2004   x 2  . CERVICAL SPINE SURGERY  2008  . ESOPHAGOGASTRODUODENOSCOPY    . HARDWARE REMOVAL Right 10/09/2014   Procedure: Removal Deep Hardware, Irrigation and Debridement Calcaneus, Place Antibiotic Beads and Wound VAC ;  Surgeon: Newt Minion, MD;  Location: [facility];  Service: Orthopedics;  Laterality: Right;  . HARDWARE REMOVAL Right 08/12/2015   Procedure: Removal Deep Hardware Right Foot;  Surgeon: Newt Minion, MD;  Location: [facility];  Service: Orthopedics;  Laterality: Right;  . HARDWARE REMOVAL Right 11/26/2018   Procedure: REMOVAL RIGHT TIBIOCALCANEAL NAIL;  Surgeon: Newt Minion, MD;  Location: [facility];  Service: Orthopedics;  Laterality: Right;  . HARDWARE REMOVAL Right 12/23/2018   Procedure: RIGHT ANKLE REMOVE HARDWARE, PLACE ANTIBIOTIC BEADS and placement of wound vac;  Surgeon: Newt Minion, MD;  Location: [facility];  Service: Orthopedics;  Laterality: Right;  . I&D EXTREMITY Right 09/15/2014   Procedure: IRRIGATION AND DEBRIDEMENT Ankle;  Surgeon: Renette Butters, MD;  Location: [facility];  Service: Orthopedics;  Laterality: Right;  . INGUINAL HERNIA REPAIR Bilateral   . LAMINECTOMY WITH POSTERIOR LATERAL ARTHRODESIS LEVEL 4 N/A 09/10/2018   Procedure: Posterior Lateral Fusion - Thoracic Eleven-Thoracic Twelve - Thoracic Twelve-Lumbar One - Lumbar One-Lumbar Two - Lumbar  Two-Lumbar Three with instrumentaion and PLA;  Surgeon: Kary Kos, MD;  Location: [facility];  Service: Neurosurgery;  Laterality: N/A;  Posterior Lateral Fusion - Thoracic Eleven-Thoracic Twelve - Thoracic Twelve-Lumbar One - Lumbar One-Lumbar Two - Lumbar Two-Lumbar Thre  . LUMBAR PERCUTANEOUS PEDICLE SCREW 1 LEVEL N/A 04/05/2017   Procedure: LUMBAR PERCUTANEOUS PEDICLE SCREW LUMBAR THREE-FOUR;  Surgeon: Kary Kos, MD;  Location: [facility];  Service: Neurosurgery;  Laterality: N/A;  . ORIF CALCANEOUS FRACTURE Right 09/19/2014   Procedure: OPEN REDUCTION INTERNAL FIXATION (ORIF) CALCANEOUS FRACTURE;  Surgeon: Newt Minion, MD;  Location: [facility];  Service: Orthopedics;  Laterality: Right;  . ORIF CALCANEOUS FRACTURE Left 09/19/2014   Procedure: OPEN REDUCTION INTERNAL FIXATION (ORIF) CALCANEOUS FRACTURE;  Surgeon: Newt Minion, MD;  Location: [facility];  Service: Orthopedics;  Laterality: Left;   Social History   Occupational History  Comment: disabled  Tobacco Use  . Smoking status: Current Some Day Smoker    Packs/day: 1.00    Years: 10.00    Pack years: 10.00    Types: Cigars    Start date: 10/08/1976    Last attempt to quit: 09/07/2014    Years since quitting: 4.3  . Smokeless tobacco: Never Used  . Tobacco comment: 06/26/16 reports smokes a cigar 2 times a week at the most  Substance and Sexual Activity  . Alcohol use: No    Comment: 06/26/16 quit 1998  . Drug use: No  . Sexual activity: Yes    Birth control/protection: None

## 2019-01-21 ENCOUNTER — Telehealth (INDEPENDENT_AMBULATORY_CARE_PROVIDER_SITE_OTHER): Payer: Self-pay

## 2019-01-21 NOTE — Telephone Encounter (Signed)
Shawn, Please advise. I do not see any antibiotics listed. Thank you

## 2019-01-21 NOTE — Telephone Encounter (Signed)
We will need to ask Dr. Sharol Given tomorrow. I did not see him last week.

## 2019-01-21 NOTE — Telephone Encounter (Signed)
Pt is down to his last 3 antibiotic pills. Questioning if he needs refill or ok to stop taking once finished with what he has? Has return appt scheduled for 01/26/19.

## 2019-01-22 NOTE — Telephone Encounter (Signed)
Dr Sharol Given, please advise the message below. Thank you

## 2019-01-23 NOTE — Telephone Encounter (Signed)
pls advise. Thanks.  

## 2019-01-23 NOTE — Telephone Encounter (Signed)
Hold on antibiotics until we see in follow up

## 2019-01-23 NOTE — Telephone Encounter (Signed)
I called patient and advised. 

## 2019-01-26 ENCOUNTER — Encounter (INDEPENDENT_AMBULATORY_CARE_PROVIDER_SITE_OTHER): Payer: Self-pay | Admitting: Orthopedic Surgery

## 2019-01-26 ENCOUNTER — Other Ambulatory Visit: Payer: Self-pay

## 2019-01-26 ENCOUNTER — Ambulatory Visit (INDEPENDENT_AMBULATORY_CARE_PROVIDER_SITE_OTHER): Payer: PPO | Admitting: Physician Assistant

## 2019-01-26 VITALS — Ht 73.0 in | Wt 170.0 lb

## 2019-01-26 DIAGNOSIS — Z981 Arthrodesis status: Secondary | ICD-10-CM

## 2019-01-26 MED ORDER — DOXYCYCLINE HYCLATE 100 MG PO CAPS: 100.0000 mg | ORAL_CAPSULE | Freq: Two times a day (BID) | ORAL | 1 refills | Status: DC

## 2019-01-26 NOTE — Progress Notes (Signed)
Office Visit Note   Patient: Bradley Espinoza           Date of Birth: Apr 22, 1956           MRN: [id number] Visit Date: 01/26/2019              Requested by: Celene Squibb, MD [place], [place] [address] PCP: Celene Squibb, MD  Chief Complaint  Patient presents with  . Right Ankle - Routine Post Op    12/23/2018 right ankle removal HDW ABX beads       HPI: The patient is a 63 year old gentleman who is seen for postoperative follow-up following removal of right ankle hardware and placement of antibiotic beads for infection following revision right ankle fusion on 12/23/2018.  Operative cultures grew MSSA.  He has been in a short leg cast, but has been weightbearing.  He has had drainage from the operative site following the antibiotic bead placement.  He did have a moderate amount of amber serous looking fluid drained from the area today.  This drainage was felt to be consistent with serous drainage related to antibiotic bead placement.  He completed his last course of Keflex. He reports he has not been smoking cigarettes since December 2019.  Assessment & Plan: Visit Diagnoses:  1. S/P ankle fusion     Plan: Will place back and knee short leg walking cast with silver cell over the incisional area.  Recommend he nonweightbearing is much as possible.  Recommend he wear his fracture boot.  Start doxycycline 100 mg p.o. twice daily for the next 4 weeks.  He is going to follow-up here in 2 weeks.  Follow-Up Instructions: Return in about 2 weeks (around 02/09/2019).   Ortho Exam  Patient is alert, oriented, no adenopathy, well-dressed, normal affect, normal respiratory effort. The incision line has amber serous appearing drainage with some mild odor.  Sutures remain intact.  He reports pain over the anterior tibia with any weightbearing when he was in the cast and pain about the operative site to palpation.  There is no erythema over the peri-incisional area.  He has  palpable pedal pulses.  Imaging: No results found.   Labs: Lab Results  Component Value Date   HGBA1C 5.1 06/26/2016   ESRSEDRATE 9 12/08/2018   REPTSTATUS 12/28/2018 FINAL 12/23/2018   GRAMSTAIN  12/23/2018    FEW WBC PRESENT,BOTH PMN AND MONONUCLEAR RARE GRAM POSITIVE COCCI Performed at [facility], [address], [place], [place] [address]    CULT  12/23/2018    FEW STAPHYLOCOCCUS AUREUS NO ANAEROBES ISOLATED; CULTURE IN PROGRESS FOR 5 DAYS    LABORGA STAPHYLOCOCCUS AUREUS 12/23/2018     Lab Results  Component Value Date   ALBUMIN 4.3 08/12/2015   ALBUMIN 4.1 05/06/2015   ALBUMIN 4.0 09/15/2014    Body mass index is 22.43 kg/m.  Orders:  No orders of the defined types were placed in this encounter.  Meds ordered this encounter  Medications  . doxycycline (VIBRAMYCIN) 100 MG capsule    Sig: Take 1 capsule (100 mg total) by mouth 2 (two) times daily.    Dispense:  28 capsule    Refill:  1     Procedures: No procedures performed  Clinical Data: No additional findings.  ROS:  All other systems negative, except as noted in the HPI. Review of Systems  Objective: Vital Signs: Ht 6\' 1"  (1.854 m)   Wt 170 lb (77.1 kg)  BMI 22.43 kg/m   Specialty Comments:  No specialty comments available.  PMFS History: Patient Active Problem List   Diagnosis Date Noted  . Wound infection following procedure 12/19/2018  . Malunion of joint fusion ([place]) 11/26/2018  . 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[facility])   . Status post lumbar spinal fusion 09/10/2018  . S/P ankle fusion 11/27/2017  . Nonunion of subtalar arthrodesis   . Avascular necrosis of talus, right ([place])   . Post-traumatic osteoarthritis, left ankle and foot 10/22/2017  . DDD (degenerative disc disease), lumbar 04/05/2017  . Fracture of L2 vertebra ([place]) 01/12/2015  . Acute osteomyelitis of calcaneum ([place]) 10/09/2014  . Dysuria 10/05/2014  . Cellulitis 10/05/2014  . Fall from ladder 09/21/2014  . L2  vertebral fracture ([place]) 09/21/2014  . Bilateral calcaneal fractures 09/21/2014  . Chronic pain 09/21/2014  . Acute blood loss anemia 09/21/2014  . Open fracture of both calcanei 09/15/2014   Past Medical History:  Diagnosis Date  . Anxiety   . Arthritis    low back pain, lumbar radiculopathy  . Depression   . Fracture    B/L ankles  . GERD (gastroesophageal reflux disease)   . Headache(784.0)    allergy related   . History of kidney stones   . History of stomach ulcers   . Retained orthopedic hardware    failed retained hardware right foot  . Wears glasses     Family History  Problem Relation Age of Onset  . Diabetes Father   . Cancer Other     Past Surgical History:  Procedure Laterality Date  . ANKLE FUSION Right 05/11/2015   Procedure: Right Posterior Arthroscopic Subtalar Arthrodesis;  Surgeon: Newt Minion, MD;  Location: [facility];  Service: Orthopedics;  Laterality: Right;  . ANKLE FUSION Right 11/27/2017   Procedure: RIGHT TIBIOCALCANEAL FUSION;  Surgeon: Newt Minion, MD;  Location: [facility];  Service: Orthopedics;  Laterality: Right;  . ANKLE FUSION Right 11/26/2018   Procedure: RIGHT ANTERIOR ANKLE FUSION, APPLY VAC;  Surgeon: Newt Minion, MD;  Location: [facility];  Service: Orthopedics;  Laterality: Right;  . ANTERIOR LAT LUMBAR FUSION N/A 04/05/2017   Procedure: Extreme Lateral Interbody Fusion - Lumbar three-lumbar four ,exploration of fusion Posterior augmentation with globus addition Removal hardware Lumbar one-three. Lumbar four-sacral one,  Removal internal bone growth stimulator;  Surgeon: Kary Kos, MD;  Location: [facility];  Service: Neurosurgery;  Laterality: N/A;  . APPLICATION OF WOUND VAC  12/23/2018   Procedure: Application Of Wound Vac;  Surgeon: Newt Minion, MD;  Location: [facility];  Service: Orthopedics;;  . BACK SURGERY  2004   x 2  . CERVICAL SPINE SURGERY  2008  . ESOPHAGOGASTRODUODENOSCOPY    . HARDWARE REMOVAL Right 10/09/2014   Procedure: Removal Deep  Hardware, Irrigation and Debridement Calcaneus, Place Antibiotic Beads and Wound VAC ;  Surgeon: Newt Minion, MD;  Location: [facility];  Service: Orthopedics;  Laterality: Right;  . HARDWARE REMOVAL Right 08/12/2015   Procedure: Removal Deep Hardware Right Foot;  Surgeon: Newt Minion, MD;  Location: [facility];  Service: Orthopedics;  Laterality: Right;  . HARDWARE REMOVAL Right 11/26/2018   Procedure: REMOVAL RIGHT TIBIOCALCANEAL NAIL;  Surgeon: Newt Minion, MD;  Location: [facility];  Service: Orthopedics;  Laterality: Right;  . HARDWARE REMOVAL Right 12/23/2018   Procedure: RIGHT ANKLE REMOVE HARDWARE, PLACE ANTIBIOTIC BEADS and placement of wound vac;  Surgeon: Newt Minion, MD;  Location: [facility];  Service: Orthopedics;  Laterality: Right;  .  I&D EXTREMITY Right 09/15/2014   Procedure: IRRIGATION AND DEBRIDEMENT Ankle;  Surgeon: Renette Butters, MD;  Location: [facility];  Service: Orthopedics;  Laterality: Right;  . INGUINAL HERNIA REPAIR Bilateral   . LAMINECTOMY WITH POSTERIOR LATERAL ARTHRODESIS LEVEL 4 N/A 09/10/2018   Procedure: Posterior Lateral Fusion - Thoracic Eleven-Thoracic Twelve - Thoracic Twelve-Lumbar One - Lumbar One-Lumbar Two - Lumbar Two-Lumbar Three with instrumentaion and PLA;  Surgeon: Kary Kos, MD;  Location: [facility];  Service: Neurosurgery;  Laterality: N/A;  Posterior Lateral Fusion - Thoracic Eleven-Thoracic Twelve - Thoracic Twelve-Lumbar One - Lumbar One-Lumbar Two - Lumbar Two-Lumbar Thre  . LUMBAR PERCUTANEOUS PEDICLE SCREW 1 LEVEL N/A 04/05/2017   Procedure: LUMBAR PERCUTANEOUS PEDICLE SCREW LUMBAR THREE-FOUR;  Surgeon: Kary Kos, MD;  Location: [facility];  Service: Neurosurgery;  Laterality: N/A;  . ORIF CALCANEOUS FRACTURE Right 09/19/2014   Procedure: OPEN REDUCTION INTERNAL FIXATION (ORIF) CALCANEOUS FRACTURE;  Surgeon: Newt Minion, MD;  Location: [facility];  Service: Orthopedics;  Laterality: Right;  . ORIF CALCANEOUS FRACTURE Left 09/19/2014   Procedure: OPEN REDUCTION INTERNAL  FIXATION (ORIF) CALCANEOUS FRACTURE;  Surgeon: Newt Minion, MD;  Location: [facility];  Service: Orthopedics;  Laterality: Left;   Social History   Occupational History    Comment: disabled  Tobacco Use  . Smoking status: Current Some Day Smoker    Packs/day: 1.00    Years: 10.00    Pack years: 10.00    Types: Cigars    Start date: 10/08/1976    Last attempt to quit: 09/07/2014    Years since quitting: 4.3  . Smokeless tobacco: Never Used  . Tobacco comment: 06/26/16 reports smokes a cigar 2 times a week at the most  Substance and Sexual Activity  . Alcohol use: No    Comment: 06/26/16 quit 1998  . Drug use: No  . Sexual activity: Yes    Birth control/protection: None

## 2019-02-09 ENCOUNTER — Encounter: Payer: Self-pay | Admitting: Orthopedic Surgery

## 2019-02-09 ENCOUNTER — Ambulatory Visit (INDEPENDENT_AMBULATORY_CARE_PROVIDER_SITE_OTHER): Payer: PPO | Admitting: Orthopedic Surgery

## 2019-02-09 ENCOUNTER — Other Ambulatory Visit: Payer: Self-pay

## 2019-02-09 ENCOUNTER — Ambulatory Visit (INDEPENDENT_AMBULATORY_CARE_PROVIDER_SITE_OTHER): Payer: PPO

## 2019-02-09 VITALS — Ht 73.0 in | Wt 170.0 lb

## 2019-02-09 DIAGNOSIS — T8149XA Infection following a procedure, other surgical site, initial encounter: Secondary | ICD-10-CM

## 2019-02-09 DIAGNOSIS — Z981 Arthrodesis status: Secondary | ICD-10-CM

## 2019-02-09 NOTE — Progress Notes (Signed)
Office Visit Note   Patient: Bradley Espinoza           Date of Birth: [birth year]           MRN: [id number] Visit Date: 02/09/2019              Requested by: Celene Squibb, MD [place], [place] [address] PCP: Celene Squibb, MD  Chief Complaint  Patient presents with  . Right Ankle - Routine Post Op    12/23/2018 right ankle remove HDW and ABX beads placed      HPI: Patient is a 63 year old gentleman who presents approximately 6 weeks status post removal of deep retained hardware ankle fusion on the right he has been in a short leg cast nonweightbearing.  Patient is completing a course of doxycycline her antibiotic beads with vancomycin and gentamicin in place.  Assessment & Plan: Visit Diagnoses:  1. S/P ankle fusion   2. Wound infection after surgery     Plan: We will continue with a short leg cast nonweightbearing we will apply silver cell to the wound short leg cast with a heel lift.  2 view radiographs of the right ankle at follow-up out of plaster  Follow-Up Instructions: Return in about 2 weeks (around 02/23/2019).   Ortho Exam  Patient is alert, oriented, no adenopathy, well-dressed, normal affect, normal respiratory effort. Examination patient's fusion is stable there is no redness no cellulitis the incision is healing nicely there is a little bit of serous drainage minimal swelling no clinical signs of infection.  Patient does have a leg length inequality approximately 1 inch shorter on the right than the left.  Imaging: Xr Ankle Complete Right  Result Date: 02/09/2019 2 view radiographs of the right ankle shows stable alignment of the ankle fusion there are antibiotic beads in place no lytic changes no radiographic evidence of any destructive osteomyelitis.  No images are attached to the encounter.  Labs: Lab Results  Component Value Date   HGBA1C 5.1 06/26/2016   ESRSEDRATE 9 12/08/2018   REPTSTATUS 12/28/2018 FINAL 12/23/2018   GRAMSTAIN   12/23/2018    FEW WBC PRESENT,BOTH PMN AND MONONUCLEAR RARE GRAM POSITIVE COCCI Performed at [facility], [address], [place], [place] [address]    CULT  12/23/2018    FEW STAPHYLOCOCCUS AUREUS NO ANAEROBES ISOLATED; CULTURE IN PROGRESS FOR 5 DAYS    LABORGA STAPHYLOCOCCUS AUREUS 12/23/2018     Lab Results  Component Value Date   ALBUMIN 4.3 08/12/2015   ALBUMIN 4.1 05/06/2015   ALBUMIN 4.0 09/15/2014    Body mass index is 22.43 kg/m.  Orders:  Orders Placed This Encounter  Procedures  . XR Ankle Complete Right   No orders of the defined types were placed in this encounter.    Procedures: No procedures performed  Clinical Data: No additional findings.  ROS:  All other systems negative, except as noted in the HPI. Review of Systems  Objective: Vital Signs: Ht 6\' 1"  (1.854 m)   Wt 170 lb (77.1 kg)   BMI 22.43 kg/m   Specialty Comments:  No specialty comments available.  PMFS History: Patient Active Problem List   Diagnosis Date Noted  . Wound infection following procedure 12/19/2018  . Malunion of joint fusion ([place]) 11/26/2018  . 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HCC)   . Status post lumbar spinal fusion 09/10/2018  . S/P ankle fusion 11/27/2017  . Nonunion of subtalar arthrodesis   . Avascular necrosis of  talus, right ([place])   . Post-traumatic osteoarthritis, left ankle and foot 10/22/2017  . DDD (degenerative disc disease), lumbar 04/05/2017  . Fracture of L2 vertebra ([place]) 01/12/2015  . Acute osteomyelitis of calcaneum ([place]) 10/09/2014  . Dysuria 10/05/2014  . Cellulitis 10/05/2014  . Fall from ladder 09/21/2014  . L2 vertebral fracture ([place]) 09/21/2014  . Bilateral calcaneal fractures 09/21/2014  . Chronic pain 09/21/2014  . Acute blood loss anemia 09/21/2014  . Open fracture of both calcanei 09/15/2014   Past Medical History:  Diagnosis Date  . Anxiety   . Arthritis    low back pain, lumbar radiculopathy  . Depression   . Fracture     B/L ankles  . GERD (gastroesophageal reflux disease)   . Headache(784.0)    allergy related   . History of kidney stones   . History of stomach ulcers   . Retained orthopedic hardware    failed retained hardware right foot  . Wears glasses     Family History  Problem Relation Age of Onset  . Diabetes Father   . Cancer Other     Past Surgical History:  Procedure Laterality Date  . ANKLE FUSION Right 05/11/2015   Procedure: Right Posterior Arthroscopic Subtalar Arthrodesis;  Surgeon: Newt Minion, MD;  Location: [facility];  Service: Orthopedics;  Laterality: Right;  . ANKLE FUSION Right 11/27/2017   Procedure: RIGHT TIBIOCALCANEAL FUSION;  Surgeon: Newt Minion, MD;  Location: [facility];  Service: Orthopedics;  Laterality: Right;  . ANKLE FUSION Right 11/26/2018   Procedure: RIGHT ANTERIOR ANKLE FUSION, APPLY VAC;  Surgeon: Newt Minion, MD;  Location: [facility];  Service: Orthopedics;  Laterality: Right;  . ANTERIOR LAT LUMBAR FUSION N/A 04/05/2017   Procedure: Extreme Lateral Interbody Fusion - Lumbar three-lumbar four ,exploration of fusion Posterior augmentation with globus addition Removal hardware Lumbar one-three. Lumbar four-sacral one,  Removal internal bone growth stimulator;  Surgeon: Kary Kos, MD;  Location: [facility];  Service: Neurosurgery;  Laterality: N/A;  . APPLICATION OF WOUND VAC  12/23/2018   Procedure: Application Of Wound Vac;  Surgeon: Newt Minion, MD;  Location: [facility];  Service: Orthopedics;;  . BACK SURGERY  2004   x 2  . CERVICAL SPINE SURGERY  2008  . ESOPHAGOGASTRODUODENOSCOPY    . HARDWARE REMOVAL Right 10/09/2014   Procedure: Removal Deep Hardware, Irrigation and Debridement Calcaneus, Place Antibiotic Beads and Wound VAC ;  Surgeon: Newt Minion, MD;  Location: [facility];  Service: Orthopedics;  Laterality: Right;  . HARDWARE REMOVAL Right 08/12/2015   Procedure: Removal Deep Hardware Right Foot;  Surgeon: Newt Minion, MD;  Location: [facility];  Service: Orthopedics;   Laterality: Right;  . HARDWARE REMOVAL Right 11/26/2018   Procedure: REMOVAL RIGHT TIBIOCALCANEAL NAIL;  Surgeon: Newt Minion, MD;  Location: [facility];  Service: Orthopedics;  Laterality: Right;  . HARDWARE REMOVAL Right 12/23/2018   Procedure: RIGHT ANKLE REMOVE HARDWARE, PLACE ANTIBIOTIC BEADS and placement of wound vac;  Surgeon: Newt Minion, MD;  Location: [facility];  Service: Orthopedics;  Laterality: Right;  . I&D EXTREMITY Right 09/15/2014   Procedure: IRRIGATION AND DEBRIDEMENT Ankle;  Surgeon: Renette Butters, MD;  Location: [place];  Service: Orthopedics;  Laterality: Right;  . INGUINAL HERNIA REPAIR Bilateral   . LAMINECTOMY WITH POSTERIOR LATERAL ARTHRODESIS LEVEL 4 N/A 09/10/2018   Procedure: Posterior Lateral Fusion - Thoracic Eleven-Thoracic Twelve - Thoracic Twelve-Lumbar One - Lumbar One-Lumbar Two - Lumbar Two-Lumbar Three with instrumentaion  and PLA;  Surgeon: Kary Kos, MD;  Location: [facility];  Service: Neurosurgery;  Laterality: N/A;  Posterior Lateral Fusion - Thoracic Eleven-Thoracic Twelve - Thoracic Twelve-Lumbar One - Lumbar One-Lumbar Two - Lumbar Two-Lumbar Thre  . LUMBAR PERCUTANEOUS PEDICLE SCREW 1 LEVEL N/A 04/05/2017   Procedure: LUMBAR PERCUTANEOUS PEDICLE SCREW LUMBAR THREE-FOUR;  Surgeon: Kary Kos, MD;  Location: [facility];  Service: Neurosurgery;  Laterality: N/A;  . ORIF CALCANEOUS FRACTURE Right 09/19/2014   Procedure: OPEN REDUCTION INTERNAL FIXATION (ORIF) CALCANEOUS FRACTURE;  Surgeon: Newt Minion, MD;  Location: [facility];  Service: Orthopedics;  Laterality: Right;  . ORIF CALCANEOUS FRACTURE Left 09/19/2014   Procedure: OPEN REDUCTION INTERNAL FIXATION (ORIF) CALCANEOUS FRACTURE;  Surgeon: Newt Minion, MD;  Location: [facility];  Service: Orthopedics;  Laterality: Left;   Social History   Occupational History    Comment: disabled  Tobacco Use  . Smoking status: Current Some Day Smoker    Packs/day: 1.00    Years: 10.00    Pack years: 10.00    Types: Cigars     Start date: 10/08/1976    Last attempt to quit: 09/07/2014    Years since quitting: 4.4  . Smokeless tobacco: Never Used  . Tobacco comment: 06/26/16 reports smokes a cigar 2 times a week at the most  Substance and Sexual Activity  . Alcohol use: No    Comment: 06/26/16 quit 1998  . Drug use: No  . Sexual activity: Yes    Birth control/protection: None

## 2019-02-18 DIAGNOSIS — Z0000 Encounter for general adult medical examination without abnormal findings: Secondary | ICD-10-CM | POA: Diagnosis not present

## 2019-02-18 DIAGNOSIS — Z77018 Contact with and (suspected) exposure to other hazardous metals: Secondary | ICD-10-CM | POA: Diagnosis not present

## 2019-02-18 DIAGNOSIS — S6991XA Unspecified injury of right wrist, hand and finger(s), initial encounter: Secondary | ICD-10-CM | POA: Diagnosis not present

## 2019-02-19 DIAGNOSIS — S32001A Stable burst fracture of unspecified lumbar vertebra, initial encounter for closed fracture: Secondary | ICD-10-CM | POA: Diagnosis not present

## 2019-02-19 DIAGNOSIS — M5440 Lumbago with sciatica, unspecified side: Secondary | ICD-10-CM | POA: Diagnosis not present

## 2019-02-19 DIAGNOSIS — M545 Low back pain: Secondary | ICD-10-CM | POA: Diagnosis not present

## 2019-02-23 ENCOUNTER — Encounter: Payer: Self-pay | Admitting: Orthopedic Surgery

## 2019-02-23 ENCOUNTER — Ambulatory Visit (INDEPENDENT_AMBULATORY_CARE_PROVIDER_SITE_OTHER): Payer: PPO

## 2019-02-23 ENCOUNTER — Other Ambulatory Visit: Payer: Self-pay

## 2019-02-23 ENCOUNTER — Ambulatory Visit (INDEPENDENT_AMBULATORY_CARE_PROVIDER_SITE_OTHER): Payer: PPO | Admitting: Orthopedic Surgery

## 2019-02-23 VITALS — Ht 73.0 in | Wt 170.0 lb

## 2019-02-23 DIAGNOSIS — M25571 Pain in right ankle and joints of right foot: Secondary | ICD-10-CM

## 2019-02-23 DIAGNOSIS — M19171 Post-traumatic osteoarthritis, right ankle and foot: Secondary | ICD-10-CM

## 2019-02-23 DIAGNOSIS — Z981 Arthrodesis status: Secondary | ICD-10-CM

## 2019-02-23 NOTE — Progress Notes (Signed)
Office Visit Note   Patient: Bradley Espinoza           Date of Birth: 05/20/1956           MRN: [id number] Visit Date: 02/23/2019              Requested by: Celene Squibb, MD [place], [place] [address] PCP: Celene Squibb, MD  Chief Complaint  Patient presents with  . Right Ankle - Routine Post Op    12/23/2018 right ankle removal HDW ABX beads.       HPI: The patient is a 63 yo gentleman who is seen for post operative follow up following removal of deep retained hardware ankle fusion on the right  12/23/2018. He has been in a short leg cast partial weight bearing in a post operative shoe with crutches. He reports the ankle remains painful and he is having more pain in the left ankle as he is favoring the right ankle following surgery. He completed a course of Doxycycline. He had antibiotic beads, vancomycin and gentamicin in place. He is in pain management.   Assessment & Plan: Visit Diagnoses:  1. Pain in right ankle and joints of right foot   2. Post-traumatic osteoarthritis, right ankle and foot   3. H/O ankle fusion     Plan: Reapply short leg cast for the next 2 weeks.  Follow up in 2 weeks with radiographs.   Follow-Up Instructions: Return in about 2 weeks (around 03/09/2019).   Ortho Exam  Patient is alert, oriented, no adenopathy, well-dressed, normal affect, normal respiratory effort. There is mild edema localized to the ankle. Good pedal pulses. No signs of infection or cellulitis.   Imaging: Xr Ankle 2 Views Right  Result Date: 02/23/2019 IRadiographs of the right ankle show interval healing and stable internal fixation    Labs: Lab Results  Component Value Date   HGBA1C 5.1 06/26/2016   ESRSEDRATE 9 12/08/2018   REPTSTATUS 12/28/2018 FINAL 12/23/2018   GRAMSTAIN  12/23/2018    FEW WBC PRESENT,BOTH PMN AND MONONUCLEAR RARE GRAM POSITIVE COCCI Performed at [facility], [place] [address] [place], [place] [address]    CULT   12/23/2018    FEW STAPHYLOCOCCUS AUREUS NO ANAEROBES ISOLATED; CULTURE IN PROGRESS FOR 5 DAYS    LABORGA STAPHYLOCOCCUS AUREUS 12/23/2018     Lab Results  Component Value Date   ALBUMIN 4.3 08/12/2015   ALBUMIN 4.1 05/06/2015   ALBUMIN 4.0 09/15/2014    Body mass index is 22.43 kg/m.  Orders:  Orders Placed This Encounter  Procedures  . XR Ankle 2 Views Right   No orders of the defined types were placed in this encounter.    Procedures: No procedures performed  Clinical Data: No additional findings.  ROS:  All other systems negative, except as noted in the HPI. Review of Systems  Objective: Vital Signs: Ht 6\' 1"  (1.854 m)   Wt 170 lb (77.1 kg)   BMI 22.43 kg/m   Specialty Comments:  No specialty comments available.  PMFS History: Patient Active Problem List   Diagnosis Date Noted  . Wound infection following procedure 12/19/2018  . Malunion of joint fusion ([place]) 11/26/2018  . 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HCC)   . Status post lumbar spinal fusion 09/10/2018  . S/P ankle fusion 11/27/2017  . Nonunion of subtalar arthrodesis   . Avascular necrosis of talus, right ([place])   . Post-traumatic osteoarthritis, left ankle and foot 10/22/2017  .  DDD (degenerative disc disease), lumbar 04/05/2017  . Fracture of L2 vertebra ([place]) 01/12/2015  . Acute osteomyelitis of calcaneum ([place]) 10/09/2014  . Dysuria 10/05/2014  . Cellulitis 10/05/2014  . Fall from ladder 09/21/2014  . L2 vertebral fracture ([place]) 09/21/2014  . Bilateral calcaneal fractures 09/21/2014  . Chronic pain 09/21/2014  . Acute blood loss anemia 09/21/2014  . Open fracture of both calcanei 09/15/2014   Past Medical History:  Diagnosis Date  . Anxiety   . Arthritis    low back pain, lumbar radiculopathy  . Depression   . Fracture    B/L ankles  . GERD (gastroesophageal reflux disease)   . Headache(784.0)    allergy related   . History of kidney stones   . History of stomach ulcers   . Retained  orthopedic hardware    failed retained hardware right foot  . Wears glasses     Family History  Problem Relation Age of Onset  . Diabetes Father   . Cancer Other     Past Surgical History:  Procedure Laterality Date  . ANKLE FUSION Right 05/11/2015   Procedure: Right Posterior Arthroscopic Subtalar Arthrodesis;  Surgeon: Newt Minion, MD;  Location: [facility];  Service: Orthopedics;  Laterality: Right;  . ANKLE FUSION Right 11/27/2017   Procedure: RIGHT TIBIOCALCANEAL FUSION;  Surgeon: Newt Minion, MD;  Location: [facility];  Service: Orthopedics;  Laterality: Right;  . ANKLE FUSION Right 11/26/2018   Procedure: RIGHT ANTERIOR ANKLE FUSION, APPLY VAC;  Surgeon: Newt Minion, MD;  Location: [facility];  Service: Orthopedics;  Laterality: Right;  . ANTERIOR LAT LUMBAR FUSION N/A 04/05/2017   Procedure: Extreme Lateral Interbody Fusion - Lumbar three-lumbar four ,exploration of fusion Posterior augmentation with globus addition Removal hardware Lumbar one-three. Lumbar four-sacral one,  Removal internal bone growth stimulator;  Surgeon: Kary Kos, MD;  Location: [facility];  Service: Neurosurgery;  Laterality: N/A;  . APPLICATION OF WOUND VAC  12/23/2018   Procedure: Application Of Wound Vac;  Surgeon: Newt Minion, MD;  Location: [facility];  Service: Orthopedics;;  . BACK SURGERY  2004   x 2  . CERVICAL SPINE SURGERY  2008  . ESOPHAGOGASTRODUODENOSCOPY    . HARDWARE REMOVAL Right 10/09/2014   Procedure: Removal Deep Hardware, Irrigation and Debridement Calcaneus, Place Antibiotic Beads and Wound VAC ;  Surgeon: Newt Minion, MD;  Location: [facility];  Service: Orthopedics;  Laterality: Right;  . HARDWARE REMOVAL Right 08/12/2015   Procedure: Removal Deep Hardware Right Foot;  Surgeon: Newt Minion, MD;  Location: [facility];  Service: Orthopedics;  Laterality: Right;  . HARDWARE REMOVAL Right 11/26/2018   Procedure: REMOVAL RIGHT TIBIOCALCANEAL NAIL;  Surgeon: Newt Minion, MD;  Location: [facility];  Service:  Orthopedics;  Laterality: Right;  . HARDWARE REMOVAL Right 12/23/2018   Procedure: RIGHT ANKLE REMOVE HARDWARE, PLACE ANTIBIOTIC BEADS and placement of wound vac;  Surgeon: Newt Minion, MD;  Location: [facility];  Service: Orthopedics;  Laterality: Right;  . I&D EXTREMITY Right 09/15/2014   Procedure: IRRIGATION AND DEBRIDEMENT Ankle;  Surgeon: Renette Butters, MD;  Location: [facility];  Service: Orthopedics;  Laterality: Right;  . INGUINAL HERNIA REPAIR Bilateral   . LAMINECTOMY WITH POSTERIOR LATERAL ARTHRODESIS LEVEL 4 N/A 09/10/2018   Procedure: Posterior Lateral Fusion - Thoracic Eleven-Thoracic Twelve - Thoracic Twelve-Lumbar One - Lumbar One-Lumbar Two - Lumbar Two-Lumbar Three with instrumentaion and PLA;  Surgeon: Kary Kos, MD;  Location: [facility] [facility];  Service: Neurosurgery;  Laterality: N/A;  Posterior Lateral Fusion - Thoracic Eleven-Thoracic Twelve - Thoracic Twelve-Lumbar One - Lumbar One-Lumbar Two - Lumbar Two-Lumbar Thre  . LUMBAR PERCUTANEOUS PEDICLE SCREW 1 LEVEL N/A 04/05/2017   Procedure: LUMBAR PERCUTANEOUS PEDICLE SCREW LUMBAR THREE-FOUR;  Surgeon: Kary Kos, MD;  Location: [facility];  Service: Neurosurgery;  Laterality: N/A;  . ORIF CALCANEOUS FRACTURE Right 09/19/2014   Procedure: OPEN REDUCTION INTERNAL FIXATION (ORIF) CALCANEOUS FRACTURE;  Surgeon: Newt Minion, MD;  Location: [facility];  Service: Orthopedics;  Laterality: Right;  . ORIF CALCANEOUS FRACTURE Left 09/19/2014   Procedure: OPEN REDUCTION INTERNAL FIXATION (ORIF) CALCANEOUS FRACTURE;  Surgeon: Newt Minion, MD;  Location: [facility];  Service: Orthopedics;  Laterality: Left;   Social History   Occupational History    Comment: disabled  Tobacco Use  . Smoking status: Current Some Day Smoker    Packs/day: 1.00    Years: 10.00    Pack years: 10.00    Types: Cigars    Start date: 10/08/1976    Last attempt to quit: 09/07/2014    Years since quitting: 4.4  . Smokeless tobacco: Never Used  . Tobacco comment: 06/26/16 reports smokes  a cigar 2 times a week at the most  Substance and Sexual Activity  . Alcohol use: No    Comment: 06/26/16 quit 1998  . Drug use: No  . Sexual activity: Yes    Birth control/protection: None

## 2019-02-26 DIAGNOSIS — S81801D Unspecified open wound, right lower leg, subsequent encounter: Secondary | ICD-10-CM | POA: Diagnosis not present

## 2019-02-26 DIAGNOSIS — M545 Low back pain: Secondary | ICD-10-CM | POA: Diagnosis not present

## 2019-02-26 DIAGNOSIS — R030 Elevated blood-pressure reading, without diagnosis of hypertension: Secondary | ICD-10-CM | POA: Diagnosis not present

## 2019-03-09 ENCOUNTER — Ambulatory Visit: Payer: PPO | Admitting: Orthopedic Surgery

## 2019-03-12 ENCOUNTER — Ambulatory Visit (INDEPENDENT_AMBULATORY_CARE_PROVIDER_SITE_OTHER): Payer: PPO | Admitting: Physician Assistant

## 2019-03-12 ENCOUNTER — Ambulatory Visit (INDEPENDENT_AMBULATORY_CARE_PROVIDER_SITE_OTHER): Payer: PPO

## 2019-03-12 ENCOUNTER — Encounter: Payer: Self-pay | Admitting: Physician Assistant

## 2019-03-12 ENCOUNTER — Other Ambulatory Visit: Payer: Self-pay

## 2019-03-12 VITALS — Ht 73.0 in | Wt 170.0 lb

## 2019-03-12 DIAGNOSIS — Z981 Arthrodesis status: Secondary | ICD-10-CM

## 2019-03-12 DIAGNOSIS — M25571 Pain in right ankle and joints of right foot: Secondary | ICD-10-CM

## 2019-03-15 ENCOUNTER — Encounter: Payer: Self-pay | Admitting: Physician Assistant

## 2019-03-15 NOTE — Progress Notes (Signed)
Office Visit Note   Patient: Bradley Espinoza           Date of Birth: 1956/06/06           MRN: [id number] Visit Date: 03/12/2019              Requested by: Celene Squibb, MD [place], [place] [address] PCP: Celene Squibb, MD  Chief Complaint  Patient presents with  . Right Ankle - Routine Post Op    12/23/2018 right ankle removal HDW ABX beads      HPI: The patient is a 63 year old gentleman who presents approximately 10 weeks status post removal of deep retained hardware ankle fusion on the right.  He is continued in a short leg cast and nonweightbearing is much as possible.  He had antibiotic beads placed with vancomycin and gentamicin.  He also completed a recent course of doxycycline.  He reports the ankle continues to be painful.  Assessment & Plan: Visit Diagnoses:  1. S/P ankle fusion   2. Pain in right ankle and joints of right foot     Plan: We have reapplied a short leg weightbearing cast today. He will follow-up in 2 weeks with follow-up radiographs at that time.  Follow-Up Instructions: Return in about 2 weeks (around 03/26/2019).   Ortho Exam  Patient is alert, oriented, no adenopathy, well-dressed, normal affect, normal respiratory effort. Right ankle incision is closed except for a 1 to 2 mm diameter area of crusting about the central incision line.  There is edema localized to the ankle.  There are no clinical signs of cellulitis or infection.  Imaging: No results found.   Labs: Lab Results  Component Value Date   HGBA1C 5.1 06/26/2016   ESRSEDRATE 9 12/08/2018   REPTSTATUS 12/28/2018 FINAL 12/23/2018   GRAMSTAIN  12/23/2018    FEW WBC PRESENT,BOTH PMN AND MONONUCLEAR RARE GRAM POSITIVE COCCI Performed at [facility], [address] [place], [place] [address]    CULT  12/23/2018    FEW STAPHYLOCOCCUS AUREUS NO ANAEROBES ISOLATED; CULTURE IN PROGRESS FOR 5 DAYS    LABORGA STAPHYLOCOCCUS AUREUS 12/23/2018     Lab  Results  Component Value Date   ALBUMIN 4.3 08/12/2015   ALBUMIN 4.1 05/06/2015   ALBUMIN 4.0 09/15/2014    Body mass index is 22.43 kg/m.  Orders:  Orders Placed This Encounter  Procedures  . XR Ankle Complete Right   No orders of the defined types were placed in this encounter.    Procedures: No procedures performed  Clinical Data: No additional findings.  ROS:  All other systems negative, except as noted in the HPI. Review of Systems  Objective: Vital Signs: Ht 6\' 1"  (1.854 m)   Wt 170 lb (77.1 kg)   BMI 22.43 kg/m   Specialty Comments:  No specialty comments available.  PMFS History: Patient Active Problem List   Diagnosis Date Noted  . Wound infection following procedure 12/19/2018  . Malunion of joint fusion ([place]) 11/26/2018  . 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HCC)   . Status post lumbar spinal fusion 09/10/2018  . S/P ankle fusion 11/27/2017  . Nonunion of subtalar arthrodesis   . Avascular necrosis of talus, right ([place])   . Post-traumatic osteoarthritis, left ankle and foot 10/22/2017  . DDD (degenerative disc disease), lumbar 04/05/2017  . Fracture of L2 vertebra ([place]) 01/12/2015  . Acute osteomyelitis of calcaneum ([place]) 10/09/2014  . Dysuria 10/05/2014  . Cellulitis 10/05/2014  .  Fall from ladder 09/21/2014  . L2 vertebral fracture ([place]) 09/21/2014  . Bilateral calcaneal fractures 09/21/2014  . Chronic pain 09/21/2014  . Acute blood loss anemia 09/21/2014  . Open fracture of both calcanei 09/15/2014   Past Medical History:  Diagnosis Date  . Anxiety   . Arthritis    low back pain, lumbar radiculopathy  . Depression   . Fracture    B/L ankles  . GERD (gastroesophageal reflux disease)   . Headache(784.0)    allergy related   . History of kidney stones   . History of stomach ulcers   . Retained orthopedic hardware    failed retained hardware right foot  . Wears glasses     Family History  Problem Relation Age of Onset  . Diabetes Father   .  Cancer Other     Past Surgical History:  Procedure Laterality Date  . ANKLE FUSION Right 05/11/2015   Procedure: Right Posterior Arthroscopic Subtalar Arthrodesis;  Surgeon: Newt Minion, MD;  Location: [facility];  Service: Orthopedics;  Laterality: Right;  . ANKLE FUSION Right 11/27/2017   Procedure: RIGHT TIBIOCALCANEAL FUSION;  Surgeon: Newt Minion, MD;  Location: [facility];  Service: Orthopedics;  Laterality: Right;  . ANKLE FUSION Right 11/26/2018   Procedure: RIGHT ANTERIOR ANKLE FUSION, APPLY VAC;  Surgeon: Newt Minion, MD;  Location: [facility] [facility];  Service: Orthopedics;  Laterality: Right;  . ANTERIOR LAT LUMBAR FUSION N/A 04/05/2017   Procedure: Extreme Lateral Interbody Fusion - Lumbar three-lumbar four ,exploration of fusion Posterior augmentation with globus addition Removal hardware Lumbar one-three. Lumbar four-sacral one,  Removal internal bone growth stimulator;  Surgeon: Kary Kos, MD;  Location: [facility];  Service: Neurosurgery;  Laterality: N/A;  . APPLICATION OF WOUND VAC  12/23/2018   Procedure: Application Of Wound Vac;  Surgeon: Newt Minion, MD;  Location: [facility];  Service: Orthopedics;;  . BACK SURGERY  2004   x 2  . CERVICAL SPINE SURGERY  2008  . ESOPHAGOGASTRODUODENOSCOPY    . HARDWARE REMOVAL Right 10/09/2014   Procedure: Removal Deep Hardware, Irrigation and Debridement Calcaneus, Place Antibiotic Beads and Wound VAC ;  Surgeon: Newt Minion, MD;  Location: [facility];  Service: Orthopedics;  Laterality: Right;  . HARDWARE REMOVAL Right 08/12/2015   Procedure: Removal Deep Hardware Right Foot;  Surgeon: Newt Minion, MD;  Location: [facility];  Service: Orthopedics;  Laterality: Right;  . HARDWARE REMOVAL Right 11/26/2018   Procedure: REMOVAL RIGHT TIBIOCALCANEAL NAIL;  Surgeon: Newt Minion, MD;  Location: [facility];  Service: Orthopedics;  Laterality: Right;  . HARDWARE REMOVAL Right 12/23/2018   Procedure: RIGHT ANKLE REMOVE HARDWARE, PLACE ANTIBIOTIC BEADS and placement of wound vac;   Surgeon: Newt Minion, MD;  Location: [facility];  Service: Orthopedics;  Laterality: Right;  . I&D EXTREMITY Right 09/15/2014   Procedure: IRRIGATION AND DEBRIDEMENT Ankle;  Surgeon: Renette Butters, MD;  Location: [place] [facility];  Service: Orthopedics;  Laterality: Right;  . INGUINAL HERNIA REPAIR Bilateral   . LAMINECTOMY WITH POSTERIOR LATERAL ARTHRODESIS LEVEL 4 N/A 09/10/2018   Procedure: Posterior Lateral Fusion - Thoracic Eleven-Thoracic Twelve - Thoracic Twelve-Lumbar One - Lumbar One-Lumbar Two - Lumbar Two-Lumbar Three with instrumentaion and PLA;  Surgeon: Kary Kos, MD;  Location: [facility];  Service: Neurosurgery;  Laterality: N/A;  Posterior Lateral Fusion - Thoracic Eleven-Thoracic Twelve - Thoracic Twelve-Lumbar One - Lumbar One-Lumbar Two - Lumbar Two-Lumbar Thre  . LUMBAR PERCUTANEOUS PEDICLE SCREW 1 LEVEL N/A 04/05/2017  Procedure: LUMBAR PERCUTANEOUS PEDICLE SCREW LUMBAR THREE-FOUR;  Surgeon: Kary Kos, MD;  Location: [facility];  Service: Neurosurgery;  Laterality: N/A;  . ORIF CALCANEOUS FRACTURE Right 09/19/2014   Procedure: OPEN REDUCTION INTERNAL FIXATION (ORIF) CALCANEOUS FRACTURE;  Surgeon: Newt Minion, MD;  Location: [facility];  Service: Orthopedics;  Laterality: Right;  . ORIF CALCANEOUS FRACTURE Left 09/19/2014   Procedure: OPEN REDUCTION INTERNAL FIXATION (ORIF) CALCANEOUS FRACTURE;  Surgeon: Newt Minion, MD;  Location: [facility] [facility];  Service: Orthopedics;  Laterality: Left;   Social History   Occupational History    Comment: disabled  Tobacco Use  . Smoking status: Current Some Day Smoker    Packs/day: 1.00    Years: 10.00    Pack years: 10.00    Types: Cigars    Start date: 10/08/1976    Last attempt to quit: 09/07/2014    Years since quitting: 4.5  . Smokeless tobacco: Never Used  . Tobacco comment: 06/26/16 reports smokes a cigar 2 times a week at the most  Substance and Sexual Activity  . Alcohol use: No    Comment: 06/26/16 quit 1998  . Drug use: No  . Sexual activity: Yes     Birth control/protection: None

## 2019-03-26 ENCOUNTER — Other Ambulatory Visit: Payer: Self-pay

## 2019-03-26 ENCOUNTER — Ambulatory Visit: Payer: PPO

## 2019-03-26 ENCOUNTER — Ambulatory Visit (INDEPENDENT_AMBULATORY_CARE_PROVIDER_SITE_OTHER): Payer: PPO | Admitting: Orthopedic Surgery

## 2019-03-26 ENCOUNTER — Encounter: Payer: Self-pay | Admitting: Physician Assistant

## 2019-03-26 VITALS — Ht 73.0 in | Wt 170.0 lb

## 2019-03-26 DIAGNOSIS — Z981 Arthrodesis status: Secondary | ICD-10-CM

## 2019-04-02 ENCOUNTER — Encounter: Payer: Self-pay | Admitting: Orthopedic Surgery

## 2019-04-02 NOTE — Progress Notes (Signed)
Office Visit Note   Patient: Bradley Espinoza           Date of Birth: 31-Dec-1955           MRN: [id number] Visit Date: 03/26/2019              Requested by: Celene Squibb, MD [place],  [place] [address] PCP: Celene Squibb, MD  Chief Complaint  Patient presents with  . Right Ankle - Routine Post Op    12/23/18 right ankle removal of HDW ABX beads.       HPI: Patient is a 63 year old gentleman who presents in follow-up status post removal of hardware placement of antibiotic beads and short leg cast to facilitate fusion.  Assessment & Plan: Visit Diagnoses:  1. H/O ankle fusion     Plan: Patient continues to show progressive improvement we will replace the short leg cast follow-up in 3 weeks with repeat radiographs of the right ankle  Follow-Up Instructions: Return in about 3 weeks (around 04/16/2019).   Ortho Exam  Patient is alert, oriented, no adenopathy, well-dressed, normal affect, normal respiratory effort. Examination the swelling has decreased around the right ankle the skin is wrinkling well the incision is well-healed there is no drainage no cellulitis no signs of infection.  His foot is plantigrade.  Imaging: No results found. No images are attached to the encounter.  Labs: Lab Results  Component Value Date   HGBA1C 5.1 06/26/2016   ESRSEDRATE 9 12/08/2018   REPTSTATUS 12/28/2018 FINAL 12/23/2018   GRAMSTAIN  12/23/2018    FEW WBC PRESENT,BOTH PMN AND MONONUCLEAR RARE GRAM POSITIVE COCCI Performed at [facility], [address] [place], [place] [address]    CULT  12/23/2018    FEW STAPHYLOCOCCUS AUREUS NO ANAEROBES ISOLATED; CULTURE IN PROGRESS FOR 5 DAYS    LABORGA STAPHYLOCOCCUS AUREUS 12/23/2018     Lab Results  Component Value Date   ALBUMIN 4.3 08/12/2015   ALBUMIN 4.1 05/06/2015   ALBUMIN 4.0 09/15/2014    Body mass index is 22.43 kg/m.  Orders:  Orders Placed This Encounter  Procedures  . XR Ankle Complete  Right   No orders of the defined types were placed in this encounter.    Procedures: No procedures performed  Clinical Data: No additional findings.  ROS:  All other systems negative, except as noted in the HPI. Review of Systems  Objective: Vital Signs: Ht 6\' 1"  (1.854 m)   Wt 170 lb (77.1 kg)   BMI 22.43 kg/m   Specialty Comments:  No specialty comments available.  PMFS History: Patient Active Problem List   Diagnosis Date Noted  . Wound infection following procedure 12/19/2018  . Malunion of joint fusion ([place]) 11/26/2018  . 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[facility])   . Status post lumbar spinal fusion 09/10/2018  . S/P ankle fusion 11/27/2017  . Nonunion of subtalar arthrodesis   . Avascular necrosis of talus, right ([place])   . Post-traumatic osteoarthritis, left ankle and foot 10/22/2017  . DDD (degenerative disc disease), lumbar 04/05/2017  . Fracture of L2 vertebra ([place]) 01/12/2015  . Acute osteomyelitis of calcaneum ([place]) 10/09/2014  . Dysuria 10/05/2014  . Cellulitis 10/05/2014  . Fall from ladder 09/21/2014  . L2 vertebral fracture ([place]) 09/21/2014  . Bilateral calcaneal fractures 09/21/2014  . Chronic pain 09/21/2014  . Acute blood loss anemia 09/21/2014  . Open fracture of both calcanei 09/15/2014   Past Medical History:  Diagnosis Date  .  Anxiety   . Arthritis    low back pain, lumbar radiculopathy  . Depression   . Fracture    B/L ankles  . GERD (gastroesophageal reflux disease)   . Headache(784.0)    allergy related   . History of kidney stones   . History of stomach ulcers   . Retained orthopedic hardware    failed retained hardware right foot  . Wears glasses     Family History  Problem Relation Age of Onset  . Diabetes Father   . Cancer Other     Past Surgical History:  Procedure Laterality Date  . ANKLE FUSION Right 05/11/2015   Procedure: Right Posterior Arthroscopic Subtalar Arthrodesis;  Surgeon: Newt Minion, MD;  Location: [facility];  Service:  Orthopedics;  Laterality: Right;  . ANKLE FUSION Right 11/27/2017   Procedure: RIGHT TIBIOCALCANEAL FUSION;  Surgeon: Newt Minion, MD;  Location: [facility];  Service: Orthopedics;  Laterality: Right;  . ANKLE FUSION Right 11/26/2018   Procedure: RIGHT ANTERIOR ANKLE FUSION, APPLY VAC;  Surgeon: Newt Minion, MD;  Location: [facility];  Service: Orthopedics;  Laterality: Right;  . ANTERIOR LAT LUMBAR FUSION N/A 04/05/2017   Procedure: Extreme Lateral Interbody Fusion - Lumbar three-lumbar four ,exploration of fusion Posterior augmentation with globus addition Removal hardware Lumbar one-three. Lumbar four-sacral one,  Removal internal bone growth stimulator;  Surgeon: Kary Kos, MD;  Location: [facility];  Service: Neurosurgery;  Laterality: N/A;  . APPLICATION OF WOUND VAC  12/23/2018   Procedure: Application Of Wound Vac;  Surgeon: Newt Minion, MD;  Location: [facility];  Service: Orthopedics;;  . BACK SURGERY  2004   x 2  . CERVICAL SPINE SURGERY  2008  . ESOPHAGOGASTRODUODENOSCOPY    . HARDWARE REMOVAL Right 10/09/2014   Procedure: Removal Deep Hardware, Irrigation and Debridement Calcaneus, Place Antibiotic Beads and Wound VAC ;  Surgeon: Newt Minion, MD;  Location: [facility];  Service: Orthopedics;  Laterality: Right;  . HARDWARE REMOVAL Right 08/12/2015   Procedure: Removal Deep Hardware Right Foot;  Surgeon: Newt Minion, MD;  Location: [facility];  Service: Orthopedics;  Laterality: Right;  . HARDWARE REMOVAL Right 11/26/2018   Procedure: REMOVAL RIGHT TIBIOCALCANEAL NAIL;  Surgeon: Newt Minion, MD;  Location: [facility];  Service: Orthopedics;  Laterality: Right;  . HARDWARE REMOVAL Right 12/23/2018   Procedure: RIGHT ANKLE REMOVE HARDWARE, PLACE ANTIBIOTIC BEADS and placement of wound vac;  Surgeon: Newt Minion, MD;  Location: [facility];  Service: Orthopedics;  Laterality: Right;  . I&D EXTREMITY Right 09/15/2014   Procedure: IRRIGATION AND DEBRIDEMENT Ankle;  Surgeon: Renette Butters, MD;  Location: [facility];   Service: Orthopedics;  Laterality: Right;  . INGUINAL HERNIA REPAIR Bilateral   . LAMINECTOMY WITH POSTERIOR LATERAL ARTHRODESIS LEVEL 4 N/A 09/10/2018   Procedure: Posterior Lateral Fusion - Thoracic Eleven-Thoracic Twelve - Thoracic Twelve-Lumbar One - Lumbar One-Lumbar Two - Lumbar Two-Lumbar Three with instrumentaion and PLA;  Surgeon: Kary Kos, MD;  Location: [facility];  Service: Neurosurgery;  Laterality: N/A;  Posterior Lateral Fusion - Thoracic Eleven-Thoracic Twelve - Thoracic Twelve-Lumbar One - Lumbar One-Lumbar Two - Lumbar Two-Lumbar Thre  . LUMBAR PERCUTANEOUS PEDICLE SCREW 1 LEVEL N/A 04/05/2017   Procedure: LUMBAR PERCUTANEOUS PEDICLE SCREW LUMBAR THREE-FOUR;  Surgeon: Kary Kos, MD;  Location: [facility];  Service: Neurosurgery;  Laterality: N/A;  . ORIF CALCANEOUS FRACTURE Right 09/19/2014   Procedure: OPEN REDUCTION INTERNAL FIXATION (ORIF) CALCANEOUS FRACTURE;  Surgeon: Newt Minion, MD;  Location: [facility];  Service: Orthopedics;  Laterality: Right;  . ORIF CALCANEOUS FRACTURE Left 09/19/2014   Procedure: OPEN REDUCTION INTERNAL FIXATION (ORIF) CALCANEOUS FRACTURE;  Surgeon: Newt Minion, MD;  Location: [facility];  Service: Orthopedics;  Laterality: Left;   Social History   Occupational History    Comment: disabled  Tobacco Use  . Smoking status: Current Some Day Smoker    Packs/day: 1.00    Years: 10.00    Pack years: 10.00    Types: Cigars    Start date: 10/08/1976    Last attempt to quit: 09/07/2014    Years since quitting: 4.5  . Smokeless tobacco: Never Used  . Tobacco comment: 06/26/16 reports smokes a cigar 2 times a week at the most  Substance and Sexual Activity  . Alcohol use: No    Comment: 06/26/16 quit 1998  . Drug use: No  . Sexual activity: Yes    Birth control/protection: None

## 2019-04-09 ENCOUNTER — Ambulatory Visit: Payer: PPO | Admitting: Physician Assistant

## 2019-04-16 ENCOUNTER — Encounter: Payer: Self-pay | Admitting: Orthopedic Surgery

## 2019-04-16 ENCOUNTER — Other Ambulatory Visit: Payer: Self-pay

## 2019-04-16 ENCOUNTER — Ambulatory Visit (INDEPENDENT_AMBULATORY_CARE_PROVIDER_SITE_OTHER): Payer: PPO

## 2019-04-16 ENCOUNTER — Ambulatory Visit (INDEPENDENT_AMBULATORY_CARE_PROVIDER_SITE_OTHER): Payer: PPO | Admitting: Physician Assistant

## 2019-04-16 VITALS — Ht 73.0 in | Wt 170.0 lb

## 2019-04-16 DIAGNOSIS — Z981 Arthrodesis status: Secondary | ICD-10-CM

## 2019-04-16 MED ORDER — NABUMETONE 750 MG PO TABS: 750.0000 mg | ORAL_TABLET | Freq: Two times a day (BID) | ORAL | 1 refills | Status: DC

## 2019-04-17 ENCOUNTER — Encounter: Payer: Self-pay | Admitting: Physician Assistant

## 2019-04-17 NOTE — Progress Notes (Signed)
Office Visit Note   Patient: Bradley Espinoza           Date of Birth: 17-Sep-1956           MRN: [id number] Visit Date: 04/16/2019              Requested by: Celene Squibb, MD [place],  [place] [address] PCP: Celene Squibb, MD  Chief Complaint  Patient presents with  . Right Ankle - Routine Post Op    12/23/18 right ankle fusion; [facility]      HPI: The patient is a 63 year old gentleman who is seen for follow-up of his right ankle removal of hardware and placement of antibiotic beads.  He has been in a short leg cast since late April.  He reports his pain continues to be much better but he does have some burning sensations over the foot.  He is taking gabapentin at bedtime.  He is on his usual pain medication for chronic back issues.  Assessment & Plan: Visit Diagnoses:  1. H/O ankle fusion     Plan: He can resume wearing his medical compression sock.  He has a ankle brace from [employer] and can resume wearing this with his shoe and heel lift.  Will also start Relafen 750 mg twice daily for the next month to see if this helps with some of his symptoms. He will follow-up in 4 weeks with radiographs of the right ankle at that time.   Follow-Up Instructions: Return in about 4 weeks (around 05/14/2019).   Ortho Exam  Patient is alert, oriented, no adenopathy, well-dressed, normal affect, normal respiratory effort. There is mild residual edema localized to the ankle only.  No signs of cellulitis or infection.  Incision is well-healed.  Palpable pedal pulse.  Imaging: No results found. No images are attached to the encounter.  Labs: Lab Results  Component Value Date   HGBA1C 5.1 06/26/2016   ESRSEDRATE 9 12/08/2018   REPTSTATUS 12/28/2018 FINAL 12/23/2018   GRAMSTAIN  12/23/2018    FEW WBC PRESENT,BOTH PMN AND MONONUCLEAR RARE GRAM POSITIVE COCCI Performed at [facility], [address], [place], [place] [address]    CULT  12/23/2018    FEW  STAPHYLOCOCCUS AUREUS NO ANAEROBES ISOLATED; CULTURE IN PROGRESS FOR 5 DAYS    LABORGA STAPHYLOCOCCUS AUREUS 12/23/2018     Lab Results  Component Value Date   ALBUMIN 4.3 08/12/2015   ALBUMIN 4.1 05/06/2015   ALBUMIN 4.0 09/15/2014    No results found for: MG No results found for: VD25OH  No results found for: PREALBUMIN CBC EXTENDED Latest Ref Rng & Units 12/19/2018 11/26/2018 09/01/2018  WBC 4.0 - 10.5 K/uL 6.9 5.3 6.8  RBC 4.22 - 5.81 MIL/uL 4.03(L) 4.67 4.65  HGB 13.0 - 17.0 g/dL 11.8(L) 14.1 14.9  HCT 39.0 - 52.0 % 36.5(L) 44.8 45.1  PLT 150 - 400 K/uL 214 198 267  NEUTROABS 1.7 - 7.7 K/uL - - -  LYMPHSABS 0.7 - 4.0 K/uL - - -     Body mass index is 22.43 kg/m.  Orders:  Orders Placed This Encounter  Procedures  . XR Ankle Complete Right   Meds ordered this encounter  Medications  . nabumetone (RELAFEN) 750 MG tablet    Sig: Take 1 tablet (750 mg total) by mouth 2 (two) times daily.    Dispense:  60 tablet    Refill:  1     Procedures: No procedures performed  Clinical Data: No additional findings.  ROS:  All other systems negative, except as noted in the HPI. Review of Systems  Objective: Vital Signs: Ht 6\' 1"  (1.854 m)   Wt 170 lb (77.1 kg)   BMI 22.43 kg/m   Specialty Comments:  No specialty comments available.  PMFS History: Patient Active Problem List   Diagnosis Date Noted  . Wound infection following procedure 12/19/2018  . Malunion of joint fusion ([facility] [place]) 11/26/2018  . 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[facility])   . Status post lumbar spinal fusion 09/10/2018  . S/P ankle fusion 11/27/2017  . Nonunion of subtalar arthrodesis   . Avascular necrosis of talus, right ([place])   . Post-traumatic osteoarthritis, left ankle and foot 10/22/2017  . DDD (degenerative disc disease), lumbar 04/05/2017  . Fracture of L2 vertebra ([facility]) 01/12/2015  . Acute osteomyelitis of calcaneum ([facility]) 10/09/2014  . Dysuria 10/05/2014  . Cellulitis 10/05/2014  . Fall from  ladder 09/21/2014  . L2 vertebral fracture ([facility]) 09/21/2014  . Bilateral calcaneal fractures 09/21/2014  . Chronic pain 09/21/2014  . Acute blood loss anemia 09/21/2014  . Open fracture of both calcanei 09/15/2014   Past Medical History:  Diagnosis Date  . Anxiety   . Arthritis    low back pain, lumbar radiculopathy  . Depression   . Fracture    B/L ankles  . GERD (gastroesophageal reflux disease)   . Headache(784.0)    allergy related   . History of kidney stones   . History of stomach ulcers   . Retained orthopedic hardware    failed retained hardware right foot  . Wears glasses     Family History  Problem Relation Age of Onset  . Diabetes Father   . Cancer Other     Past Surgical History:  Procedure Laterality Date  . ANKLE FUSION Right 05/11/2015   Procedure: Right Posterior Arthroscopic Subtalar Arthrodesis;  Surgeon: Newt Minion, MD;  Location: [facility];  Service: Orthopedics;  Laterality: Right;  . ANKLE FUSION Right 11/27/2017   Procedure: RIGHT TIBIOCALCANEAL FUSION;  Surgeon: Newt Minion, MD;  Location: [facility];  Service: Orthopedics;  Laterality: Right;  . ANKLE FUSION Right 11/26/2018   Procedure: RIGHT ANTERIOR ANKLE FUSION, APPLY VAC;  Surgeon: Newt Minion, MD;  Location: [facility];  Service: Orthopedics;  Laterality: Right;  . ANTERIOR LAT LUMBAR FUSION N/A 04/05/2017   Procedure: Extreme Lateral Interbody Fusion - Lumbar three-lumbar four ,exploration of fusion Posterior augmentation with globus addition Removal hardware Lumbar one-three. Lumbar four-sacral one,  Removal internal bone growth stimulator;  Surgeon: Kary Kos, MD;  Location: [facility];  Service: Neurosurgery;  Laterality: N/A;  . APPLICATION OF WOUND VAC  12/23/2018   Procedure: Application Of Wound Vac;  Surgeon: Newt Minion, MD;  Location: [facility];  Service: Orthopedics;;  . BACK SURGERY  2004   x 2  . CERVICAL SPINE SURGERY  2008  . ESOPHAGOGASTRODUODENOSCOPY    . HARDWARE REMOVAL Right 10/09/2014    Procedure: Removal Deep Hardware, Irrigation and Debridement Calcaneus, Place Antibiotic Beads and Wound VAC ;  Surgeon: Newt Minion, MD;  Location: [facility];  Service: Orthopedics;  Laterality: Right;  . HARDWARE REMOVAL Right 08/12/2015   Procedure: Removal Deep Hardware Right Foot;  Surgeon: Newt Minion, MD;  Location: [facility];  Service: Orthopedics;  Laterality: Right;  . HARDWARE REMOVAL Right 11/26/2018   Procedure: REMOVAL RIGHT TIBIOCALCANEAL NAIL;  Surgeon: Newt Minion, MD;  Location: [facility];  Service: Orthopedics;  Laterality: Right;  . HARDWARE REMOVAL Right 12/23/2018   Procedure: RIGHT ANKLE REMOVE HARDWARE, PLACE ANTIBIOTIC BEADS and placement of wound vac;  Surgeon: Newt Minion, MD;  Location: [facility];  Service: Orthopedics;  Laterality: Right;  . I&D EXTREMITY Right 09/15/2014   Procedure: IRRIGATION AND DEBRIDEMENT Ankle;  Surgeon: Renette Butters, MD;  Location: [facility];  Service: Orthopedics;  Laterality: Right;  . INGUINAL HERNIA REPAIR Bilateral   . LAMINECTOMY WITH POSTERIOR LATERAL ARTHRODESIS LEVEL 4 N/A 09/10/2018   Procedure: Posterior Lateral Fusion - Thoracic Eleven-Thoracic Twelve - Thoracic Twelve-Lumbar One - Lumbar One-Lumbar Two - Lumbar Two-Lumbar Three with instrumentaion and PLA;  Surgeon: Kary Kos, MD;  Location: [facility];  Service: Neurosurgery;  Laterality: N/A;  Posterior Lateral Fusion - Thoracic Eleven-Thoracic Twelve - Thoracic Twelve-Lumbar One - Lumbar One-Lumbar Two - Lumbar Two-Lumbar Thre  . LUMBAR PERCUTANEOUS PEDICLE SCREW 1 LEVEL N/A 04/05/2017   Procedure: LUMBAR PERCUTANEOUS PEDICLE SCREW LUMBAR THREE-FOUR;  Surgeon: Kary Kos, MD;  Location: [facility];  Service: Neurosurgery;  Laterality: N/A;  . ORIF CALCANEOUS FRACTURE Right 09/19/2014   Procedure: OPEN REDUCTION INTERNAL FIXATION (ORIF) CALCANEOUS FRACTURE;  Surgeon: Newt Minion, MD;  Location: [facility];  Service: Orthopedics;  Laterality: Right;  . ORIF CALCANEOUS FRACTURE Left 09/19/2014   Procedure:  OPEN REDUCTION INTERNAL FIXATION (ORIF) CALCANEOUS FRACTURE;  Surgeon: Newt Minion, MD;  Location: [facility];  Service: Orthopedics;  Laterality: Left;   Social History   Occupational History    Comment: disabled  Tobacco Use  . Smoking status: Current Some Day Smoker    Packs/day: 1.00    Years: 10.00    Pack years: 10.00    Types: Cigars    Start date: 10/08/1976    Last attempt to quit: 09/07/2014    Years since quitting: 4.6  . Smokeless tobacco: Never Used  . Tobacco comment: 06/26/16 reports smokes a cigar 2 times a week at the most  Substance and Sexual Activity  . Alcohol use: No    Comment: 06/26/16 quit 1998  . Drug use: No  . Sexual activity: Yes    Birth control/protection: None

## 2019-04-21 ENCOUNTER — Telehealth: Payer: Self-pay | Admitting: Orthopedic Surgery

## 2019-04-21 NOTE — Telephone Encounter (Signed)
Stop the nabumetone, can send naproxen if he is agreeable

## 2019-04-21 NOTE — Telephone Encounter (Signed)
pls advise

## 2019-04-21 NOTE — Telephone Encounter (Signed)
Patient called advised the medication (Nabumetone) 750mg  made him really sick. Patient said he has stopped taking the medicine. Patient asked if there  Is anything else he can take. Patient said he took a half and then quartered the tab and it still made him sick. The number to contact patient is [phone]

## 2019-04-21 NOTE — Telephone Encounter (Signed)
I s/w patient and he wants to try naproxen Bradley Espinoza in [place]

## 2019-04-22 MED ORDER — NAPROXEN 250 MG PO TABS: 250.0000 mg | ORAL_TABLET | Freq: Two times a day (BID) | ORAL | 0 refills | Status: DC

## 2019-04-27 DIAGNOSIS — R030 Elevated blood-pressure reading, without diagnosis of hypertension: Secondary | ICD-10-CM | POA: Diagnosis not present

## 2019-04-27 DIAGNOSIS — S32001A Stable burst fracture of unspecified lumbar vertebra, initial encounter for closed fracture: Secondary | ICD-10-CM | POA: Diagnosis not present

## 2019-04-27 DIAGNOSIS — Z5181 Encounter for therapeutic drug level monitoring: Secondary | ICD-10-CM | POA: Diagnosis not present

## 2019-04-27 DIAGNOSIS — Z981 Arthrodesis status: Secondary | ICD-10-CM | POA: Diagnosis not present

## 2019-04-27 DIAGNOSIS — Z79891 Long term (current) use of opiate analgesic: Secondary | ICD-10-CM | POA: Diagnosis not present

## 2019-04-27 DIAGNOSIS — Z79899 Other long term (current) drug therapy: Secondary | ICD-10-CM | POA: Diagnosis not present

## 2019-05-05 DIAGNOSIS — J039 Acute tonsillitis, unspecified: Secondary | ICD-10-CM | POA: Diagnosis not present

## 2019-05-05 DIAGNOSIS — R140 Abdominal distension (gaseous): Secondary | ICD-10-CM | POA: Diagnosis not present

## 2019-05-05 DIAGNOSIS — M5136 Other intervertebral disc degeneration, lumbar region: Secondary | ICD-10-CM | POA: Diagnosis not present

## 2019-05-05 DIAGNOSIS — E782 Mixed hyperlipidemia: Secondary | ICD-10-CM | POA: Diagnosis not present

## 2019-05-05 DIAGNOSIS — S6991XA Unspecified injury of right wrist, hand and finger(s), initial encounter: Secondary | ICD-10-CM | POA: Diagnosis not present

## 2019-05-05 DIAGNOSIS — L0291 Cutaneous abscess, unspecified: Secondary | ICD-10-CM | POA: Diagnosis not present

## 2019-05-05 DIAGNOSIS — F5221 Male erectile disorder: Secondary | ICD-10-CM | POA: Diagnosis not present

## 2019-05-05 DIAGNOSIS — R5383 Other fatigue: Secondary | ICD-10-CM | POA: Diagnosis not present

## 2019-05-05 DIAGNOSIS — Z6823 Body mass index (BMI) 23.0-23.9, adult: Secondary | ICD-10-CM | POA: Diagnosis not present

## 2019-05-05 DIAGNOSIS — Z0000 Encounter for general adult medical examination without abnormal findings: Secondary | ICD-10-CM | POA: Diagnosis not present

## 2019-05-05 DIAGNOSIS — N400 Enlarged prostate without lower urinary tract symptoms: Secondary | ICD-10-CM | POA: Diagnosis not present

## 2019-05-05 DIAGNOSIS — J029 Acute pharyngitis, unspecified: Secondary | ICD-10-CM | POA: Diagnosis not present

## 2019-05-05 DIAGNOSIS — E291 Testicular hypofunction: Secondary | ICD-10-CM | POA: Diagnosis not present

## 2019-05-05 DIAGNOSIS — Z77018 Contact with and (suspected) exposure to other hazardous metals: Secondary | ICD-10-CM | POA: Diagnosis not present

## 2019-05-12 DIAGNOSIS — F5221 Male erectile disorder: Secondary | ICD-10-CM | POA: Diagnosis not present

## 2019-05-12 DIAGNOSIS — R5383 Other fatigue: Secondary | ICD-10-CM | POA: Diagnosis not present

## 2019-05-12 DIAGNOSIS — N400 Enlarged prostate without lower urinary tract symptoms: Secondary | ICD-10-CM | POA: Diagnosis not present

## 2019-05-12 DIAGNOSIS — R140 Abdominal distension (gaseous): Secondary | ICD-10-CM | POA: Diagnosis not present

## 2019-05-12 DIAGNOSIS — E291 Testicular hypofunction: Secondary | ICD-10-CM | POA: Diagnosis not present

## 2019-05-13 ENCOUNTER — Other Ambulatory Visit: Payer: Self-pay

## 2019-05-13 ENCOUNTER — Encounter (INDEPENDENT_AMBULATORY_CARE_PROVIDER_SITE_OTHER): Payer: Self-pay | Admitting: *Deleted

## 2019-05-13 ENCOUNTER — Telehealth (INDEPENDENT_AMBULATORY_CARE_PROVIDER_SITE_OTHER): Payer: Self-pay | Admitting: *Deleted

## 2019-05-13 ENCOUNTER — Encounter (INDEPENDENT_AMBULATORY_CARE_PROVIDER_SITE_OTHER): Payer: Self-pay | Admitting: Nurse Practitioner

## 2019-05-13 ENCOUNTER — Ambulatory Visit (INDEPENDENT_AMBULATORY_CARE_PROVIDER_SITE_OTHER): Payer: PPO | Admitting: Nurse Practitioner

## 2019-05-13 DIAGNOSIS — R110 Nausea: Secondary | ICD-10-CM | POA: Insufficient documentation

## 2019-05-13 DIAGNOSIS — R140 Abdominal distension (gaseous): Secondary | ICD-10-CM

## 2019-05-13 MED ORDER — ONDANSETRON HCL 4 MG PO TABS: 4.0000 mg | ORAL_TABLET | Freq: Three times a day (TID) | ORAL | 0 refills | Status: DC | PRN

## 2019-05-13 MED ORDER — PEG 3350-KCL-NA BICARB-NACL 420 G PO SOLR: 4000.0000 mL | Freq: Once | ORAL | 0 refills | Status: AC

## 2019-05-13 NOTE — Patient Instructions (Signed)
1. Take a stool softener every day and your preferred laxative tablet every other day to increase your stool output  2. Ondansetron 4mg  one tab every 6 hours for nausea  3. Our office will contact your primary doctor's office for a copy of your blood tests done recently  4. Schedule an EGD and colonoscopy

## 2019-05-13 NOTE — Progress Notes (Signed)
Subjective:    Patient ID: Bradley Espinoza, male    DOB: Mar 01, 1956, 63 y.o.   MRN: [id number]  HPI  Patient with a PMH significant for anxiety, depression, chronic back pain, right foot injury s/p surgical repair with chronic pain, GERD, stomach ulcers and kidney stones. He complains of having increased burping, stomach pain, nausea, abdominal bloat and early satiety. No heartburn or dysphagia. No specific food triggers. He lives alone, eats frozen meals or carry out food. He eats a low fiber diet, limited dairy products. He takes Omeprazole once daily. Gas X was not effective. No recent antibiotics. He takes Naproxen 282m one tab bid once every 3 or 4 days. He reports having a history of stomach ulcers, had an EGD in the past, the details are unclear. Never had a screening colonoscopy.   Labs 05/05/2019: WBC 7.1. Hg 14.7. HCT 45.9. PLT 228. Glu 91. BUN 13. Cr. 0.80. Na 145. K 4.2. Alk phos 100. AST 12. ALT 8. Chol 172. LDL 109. HDL 41.  Abdominal sonogram with elastography 12/15/2018: ULTRASOUND ABDOMEN: Cholelithiasis. No sonographic signs of cholecystitis or biliary obstruction. Hepatic steatosis. Mild splenomegaly. ULTRASOUND HEPATIC ELASTOGRAPHY: Median hepatic shear wave velocity is calculated at 1.20 m/sec. Corresponding Metavir fibrosis score is F2 + some F3. Risk of fibrosis is Moderate.  Past Medical History:  Diagnosis Date  . Anxiety   . Arthritis    low back pain, lumbar radiculopathy  . Depression   . Fracture    B/L ankles  . GERD (gastroesophageal reflux disease)   . Headache(784.0)    allergy related   . History of kidney stones   . History of stomach ulcers   . Retained orthopedic hardware    failed retained hardware right foot  . Wears glasses    Past Surgical History:  Procedure Laterality Date  . ANKLE FUSION Right 05/11/2015   Procedure: Right Posterior Arthroscopic Subtalar Arthrodesis;  Surgeon: MNewt Minion MD;  [facility] [facility]  Service:  Orthopedics;  Laterality: Right;  . ANKLE FUSION Right 11/27/2017   Procedure: RIGHT TIBIOCALCANEAL FUSION;  Surgeon: DNewt Minion MD;  [place] [facility]  Service: Orthopedics;  Laterality: Right;  . ANKLE FUSION Right 11/26/2018   Procedure: RIGHT ANTERIOR ANKLE FUSION, APPLY VAC;  Surgeon: DNewt Minion MD;  [facility]  Service: Orthopedics;  Laterality: Right;  . ANTERIOR LAT LUMBAR FUSION N/A 04/05/2017   Procedure: Extreme Lateral Interbody Fusion - Lumbar three-lumbar four ,exploration of fusion Posterior augmentation with globus addition Removal hardware Lumbar one-three. Lumbar four-sacral one,  Removal internal bone growth stimulator;  Surgeon: CKary Kos MD;  [facility]  Service: Neurosurgery;  Laterality: N/A;  . APPLICATION OF WOUND VAC  12/23/2018   Procedure: Application Of Wound Vac;  Surgeon: DNewt Minion MD;  [facility] [facility]  Service: Orthopedics;;  . BACK SURGERY  2004   x 2  . CERVICAL SPINE SURGERY  2008  . ESOPHAGOGASTRODUODENOSCOPY    . HARDWARE REMOVAL Right 10/09/2014   Procedure: Removal Deep Hardware, Irrigation and Debridement Calcaneus, Place Antibiotic Beads and Wound VAC ;  Surgeon: MNewt Minion MD;  [place] [facility]  Service: Orthopedics;  Laterality: Right;  . HARDWARE REMOVAL Right 08/12/2015   Procedure: Removal Deep Hardware Right Foot;  Surgeon: MNewt Minion MD;  [facility] [facility]  Service: Orthopedics;  Laterality: Right;  . HARDWARE REMOVAL Right 11/26/2018   Procedure: REMOVAL RIGHT TIBIOCALCANEAL NAIL;  Surgeon: DNewt Minion MD;  Location:  [facility];  Service: Orthopedics;  Laterality: Right;  . HARDWARE REMOVAL Right 12/23/2018   Procedure: RIGHT ANKLE REMOVE HARDWARE, PLACE ANTIBIOTIC BEADS and placement of wound vac;  Surgeon: Newt Minion, MD;  Location: [facility];  Service: Orthopedics;  Laterality: Right;  . I&D EXTREMITY Right 09/15/2014   Procedure: IRRIGATION AND DEBRIDEMENT Ankle;  Surgeon: Renette Butters, MD;  Location: [facility];   Service: Orthopedics;  Laterality: Right;  . INGUINAL HERNIA REPAIR Bilateral   . LAMINECTOMY WITH POSTERIOR LATERAL ARTHRODESIS LEVEL 4 N/A 09/10/2018   Procedure: Posterior Lateral Fusion - Thoracic Eleven-Thoracic Twelve - Thoracic Twelve-Lumbar One - Lumbar One-Lumbar Two - Lumbar Two-Lumbar Three with instrumentaion and PLA;  Surgeon: Kary Kos, MD;  Location: [place];  Service: Neurosurgery;  Laterality: N/A;  Posterior Lateral Fusion - Thoracic Eleven-Thoracic Twelve - Thoracic Twelve-Lumbar One - Lumbar One-Lumbar Two - Lumbar Two-Lumbar Thre  . LUMBAR PERCUTANEOUS PEDICLE SCREW 1 LEVEL N/A 04/05/2017   Procedure: LUMBAR PERCUTANEOUS PEDICLE SCREW LUMBAR THREE-FOUR;  Surgeon: Kary Kos, MD;  Location: [place];  Service: Neurosurgery;  Laterality: N/A;  . ORIF CALCANEOUS FRACTURE Right 09/19/2014   Procedure: OPEN REDUCTION INTERNAL FIXATION (ORIF) CALCANEOUS FRACTURE;  Surgeon: Newt Minion, MD;  Location: [facility];  Service: Orthopedics;  Laterality: Right;  . ORIF CALCANEOUS FRACTURE Left 09/19/2014   Procedure: OPEN REDUCTION INTERNAL FIXATION (ORIF) CALCANEOUS FRACTURE;  Surgeon: Newt Minion, MD;  Location: [place];  Service: Orthopedics;  Laterality: Left;   Current Outpatient Medications on File Prior to Visit  Medication Sig Dispense Refill  . Docusate Sodium (COLACE PO) Take 1 tablet by mouth daily as needed (constipation).    . gabapentin (NEURONTIN) 300 MG capsule Take 300-600 mg by mouth See admin instructions. Take 35m by mouth in the morning and afternoon, take 600 mg at bedtime    . naproxen (NAPROSYN) 250 MG tablet Take 1 tablet (250 mg total) by mouth 2 (two) times daily with a meal. 60 tablet 0  . omeprazole (PRILOSEC) 40 MG capsule Take 1 capsule (40 mg total) by mouth daily. 90 capsule 3  . oxyCODONE-acetaminophen (PERCOCET/ROXICET) 5-325 MG tablet Take 1 tablet by mouth every 4 (four) hours as needed. 40 tablet 0  . tamsulosin (FLOMAX) 0.4 MG CAPS capsule Take 0.4 mg by  mouth as needed (prostate). 2 tabs daily    . testosterone cypionate (DEPOTESTOSTERONE CYPIONATE) 200 MG/ML injection Inject 200 mg into the muscle every 14 (fourteen) days.     Current Facility-Administered Medications on File Prior to Visit  Medication Dose Route Frequency Provider Last Rate Last Dose  . 0.45 % sodium chloride infusion   Intravenous Continuous Rayburn, SNeta Mends PA-C      . docusate sodium (COLACE) capsule 100 mg  100 mg Oral BID Rayburn, Shawn Montgomery, PA-C      . ondansetron (ZOFRAN) tablet 4 mg  4 mg Oral Q6H PRN Rayburn, SNeta Mends PA-C      . oxyCODONE (Oxy IR/ROXICODONE) immediate release tablet 5-10 mg  5-10 mg Oral Q3H PRN Rayburn, SNeta Mends PA-C      . senna (SENOKOT) tablet 8.6 mg  1 tablet Oral BID Rayburn, Shawn Montgomery, PA-C      . sorbitol 70 % solution 30 mL  30 mL Oral Daily PRN Rayburn, SNeta Mends PA-C       Allergies  Allergen Reactions  . Codeine Nausea And Vomiting   Family History  Problem Relation Age of Onset  . Diabetes Father   .  Cancer Other    Social History   Socioeconomic History  . Marital status: Single    Spouse name: Not on file  . Number of children: 2  . Years of education: 61  . Highest education level: Not on file  Occupational History    Comment: disabled  Social Needs  . Financial resource strain: Not on file  . Food insecurity    Worry: Not on file    Inability: Not on file  . Transportation needs    Medical: Not on file    Non-medical: Not on file  Tobacco Use  . Smoking status: Former Smoker    Packs/day: 1.00    Years: 10.00    Pack years: 10.00    Types: Cigars    Start date: 10/08/1976    Quit date: 09/07/2014    Years since quitting: 4.6  . Smokeless tobacco: Never Used  . Tobacco comment: quit smoking cegar 09/10/2018  Substance and Sexual Activity  . Alcohol use: No    Comment: 06/26/16 quit 1998  . Drug use: No  . Sexual activity: Yes    Birth control/protection:  None  Lifestyle  . Physical activity    Days per week: Not on file    Minutes per session: Not on file  . Stress: Not on file  Relationships  . Social Herbalist on phone: Not on file    Gets together: Not on file    Attends religious service: Not on file    Active member of club or organization: Not on file    Attends meetings of clubs or organizations: Not on file    Relationship status: Not on file  . Intimate partner violence    Fear of current or ex partner: Not on file    Emotionally abused: Not on file    Physically abused: Not on file    Forced sexual activity: Not on file  Other Topics Concern  . Not on file  Social History Narrative   Lives with wife   Caffeine - none   Review of Systems See HPI all other systems reviewed and are negative      Objective:   Physical Exam  Blood pressure (!) 158/83, pulse 64, temperature 98.2 F (36.8 C), height 6' (1.829 m), weight 167 lb 12.8 oz (76.1 kg). General: thin male walks with altered gait secondary to past right food injury in NAD Eyes: sclera nonicteric, conjunctiva pink Neck: supple, no thyromegaly or lymphadenapthy Mouth: partial upper plate, lower bridge Heart: RRR, no murmurs Lungs: clear throughout Abdomen: soft, slightly distended, nontender, + BS x 4 quads Rectal: deferred Extremities: no edema Neuro: alert and oriented x 3, speech is clear, moves all extremities     Assessment & Plan:   1. 63 y.o. male on chronic narcotics secondary to chronic back and right foot pain with constipation and abdominal bloat -patient prefers to take a stool softener every night and a oral laxative tab every other night, declines Miralax, if no improvement consider trial with Linzess or Movantik if covered by his insurance -schedule a colonoscopy  -patient will call me in 2 weeks with update, if sx persist or worsen will order CTAP prior to EGD/colonoscopy  2. GERD, hx stomach ulcers -EGD -continue Omeprazole 51m  once daily for now  3 Nausea, early satiety. Possible gastroparesis secondary to chronic narcotic use.  -patient would need to stop Oxycodone if gastric empty study ordered, defer for now -Zofran 474mone tab  po Q 8 hrs PRN -smaller more frequent meals  4. Gallstones per abd sono 12/15/2018. 7/28 normal LFTs  5. Hepatis steatosis per abd sono  -discussed significance of fatty liver, at risk for fatty liver disease/cirrhosis, discussed healthy diet, repeat LFTs annually   6. Mild splenomegaly per abd sono   -further follow up to be determined after EGD and colonoscopy completed

## 2019-05-13 NOTE — Telephone Encounter (Signed)
Patient needs trilyte 

## 2019-05-14 ENCOUNTER — Encounter: Payer: Self-pay | Admitting: Orthopedic Surgery

## 2019-05-14 ENCOUNTER — Ambulatory Visit (INDEPENDENT_AMBULATORY_CARE_PROVIDER_SITE_OTHER): Payer: PPO | Admitting: Orthopedic Surgery

## 2019-05-14 ENCOUNTER — Ambulatory Visit: Payer: Self-pay

## 2019-05-14 VITALS — Ht 72.0 in | Wt 167.0 lb

## 2019-05-14 DIAGNOSIS — Z981 Arthrodesis status: Secondary | ICD-10-CM | POA: Diagnosis not present

## 2019-05-20 ENCOUNTER — Encounter: Payer: Self-pay | Admitting: Family

## 2019-05-20 ENCOUNTER — Ambulatory Visit (INDEPENDENT_AMBULATORY_CARE_PROVIDER_SITE_OTHER): Payer: PPO | Admitting: Family

## 2019-05-20 ENCOUNTER — Other Ambulatory Visit: Payer: Self-pay

## 2019-05-20 VITALS — Ht 72.0 in | Wt 167.0 lb

## 2019-05-20 DIAGNOSIS — M25571 Pain in right ankle and joints of right foot: Secondary | ICD-10-CM | POA: Diagnosis not present

## 2019-05-20 DIAGNOSIS — Z981 Arthrodesis status: Secondary | ICD-10-CM | POA: Diagnosis not present

## 2019-05-20 DIAGNOSIS — M19171 Post-traumatic osteoarthritis, right ankle and foot: Secondary | ICD-10-CM

## 2019-05-20 NOTE — Progress Notes (Signed)
Post-Op Visit Note   Patient: Bradley Espinoza           Date of Birth: Aug 12, 1956           MRN: [id number] Visit Date: 05/20/2019 PCP: Celene Squibb, MD  Chief Complaint:  Chief Complaint  Patient presents with  . Right Ankle - Follow-up    12/23/2018 right ankle fusion    HPI:  HPI The patient is a 63 year old gentleman who presents today status post right ankle fusion in March of this year.  Complaining of ongoing pain.  States was seen in the office last Thursday and was offered a cast by Dr. Sharol Given.  At that time he did not want to go that route.  However today he is presenting requesting the cast to be placed.  No new concerns. Ortho Exam On examination of his right ankle the incision is well-healed.  No erythema minimal swelling no dystrophic changes.  No ulcers.  Visit Diagnoses: No diagnosis found.  Plan: We will place in a walking cast per Dr. Jess Barters direction and patient request.  We will follow-up in 3 weeks with Dr. Sharol Given.  Follow-Up Instructions: No follow-ups on file.   Imaging: No results found.  Orders:  No orders of the defined types were placed in this encounter.  No orders of the defined types were placed in this encounter.    PMFS History: Patient Active Problem List   Diagnosis Date Noted  . Nausea without vomiting 05/13/2019  . Abdominal bloating 05/13/2019  . Wound infection following procedure 12/19/2018  . Malunion of joint fusion ([place]) 11/26/2018  . 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[facility])   . Status post lumbar spinal fusion 09/10/2018  . S/P ankle fusion 11/27/2017  . Nonunion of subtalar arthrodesis   . Avascular necrosis of talus, right ([place])   . Post-traumatic osteoarthritis, left ankle and foot 10/22/2017  . DDD (degenerative disc disease), lumbar 04/05/2017  . Fracture of L2 vertebra ([place]) 01/12/2015  . Acute osteomyelitis of calcaneum ([place]) 10/09/2014  . Dysuria 10/05/2014  . Cellulitis 10/05/2014  . Fall from ladder 09/21/2014  . L2  vertebral fracture ([place]) 09/21/2014  . Bilateral calcaneal fractures 09/21/2014  . Chronic pain 09/21/2014  . Acute blood loss anemia 09/21/2014  . Open fracture of both calcanei 09/15/2014   Past Medical History:  Diagnosis Date  . Anxiety   . Arthritis    low back pain, lumbar radiculopathy  . Depression   . Fracture    B/L ankles  . GERD (gastroesophageal reflux disease)   . Headache(784.0)    allergy related   . History of kidney stones   . History of stomach ulcers   . Retained orthopedic hardware    failed retained hardware right foot  . Wears glasses     Family History  Problem Relation Age of Onset  . Diabetes Father   . Cancer Other     Past Surgical History:  Procedure Laterality Date  . ANKLE FUSION Right 05/11/2015   Procedure: Right Posterior Arthroscopic Subtalar Arthrodesis;  Surgeon: Newt Minion, MD;  Location: [facility];  Service: Orthopedics;  Laterality: Right;  . ANKLE FUSION Right 11/27/2017   Procedure: RIGHT TIBIOCALCANEAL FUSION;  Surgeon: Newt Minion, MD;  Location: [facility];  Service: Orthopedics;  Laterality: Right;  . ANKLE FUSION Right 11/26/2018   Procedure: RIGHT ANTERIOR ANKLE FUSION, APPLY VAC;  Surgeon: Newt Minion, MD;  Location: [facility];  Service: Orthopedics;  Laterality: Right;  . ANTERIOR LAT  LUMBAR FUSION N/A 04/05/2017   Procedure: Extreme Lateral Interbody Fusion - Lumbar three-lumbar four ,exploration of fusion Posterior augmentation with globus addition Removal hardware Lumbar one-three. Lumbar four-sacral one,  Removal internal bone growth stimulator;  Surgeon: Kary Kos, MD;  Location: [facility];  Service: Neurosurgery;  Laterality: N/A;  . APPLICATION OF WOUND VAC  12/23/2018   Procedure: Application Of Wound Vac;  Surgeon: Newt Minion, MD;  Location: [facility];  Service: Orthopedics;;  . BACK SURGERY  2004   x 2  . CERVICAL SPINE SURGERY  2008  . ESOPHAGOGASTRODUODENOSCOPY    . HARDWARE REMOVAL Right 10/09/2014   Procedure: Removal Deep  Hardware, Irrigation and Debridement Calcaneus, Place Antibiotic Beads and Wound VAC ;  Surgeon: Newt Minion, MD;  Location: [facility];  Service: Orthopedics;  Laterality: Right;  . HARDWARE REMOVAL Right 08/12/2015   Procedure: Removal Deep Hardware Right Foot;  Surgeon: Newt Minion, MD;  Location: [facility];  Service: Orthopedics;  Laterality: Right;  . HARDWARE REMOVAL Right 11/26/2018   Procedure: REMOVAL RIGHT TIBIOCALCANEAL NAIL;  Surgeon: Newt Minion, MD;  Location: [facility];  Service: Orthopedics;  Laterality: Right;  . HARDWARE REMOVAL Right 12/23/2018   Procedure: RIGHT ANKLE REMOVE HARDWARE, PLACE ANTIBIOTIC BEADS and placement of wound vac;  Surgeon: Newt Minion, MD;  Location: [facility];  Service: Orthopedics;  Laterality: Right;  . I&D EXTREMITY Right 09/15/2014   Procedure: IRRIGATION AND DEBRIDEMENT Ankle;  Surgeon: Renette Butters, MD;  Location: [place];  Service: Orthopedics;  Laterality: Right;  . INGUINAL HERNIA REPAIR Bilateral   . LAMINECTOMY WITH POSTERIOR LATERAL ARTHRODESIS LEVEL 4 N/A 09/10/2018   Procedure: Posterior Lateral Fusion - Thoracic Eleven-Thoracic Twelve - Thoracic Twelve-Lumbar One - Lumbar One-Lumbar Two - Lumbar Two-Lumbar Three with instrumentaion and PLA;  Surgeon: Kary Kos, MD;  Location: [facility];  Service: Neurosurgery;  Laterality: N/A;  Posterior Lateral Fusion - Thoracic Eleven-Thoracic Twelve - Thoracic Twelve-Lumbar One - Lumbar One-Lumbar Two - Lumbar Two-Lumbar Thre  . LUMBAR PERCUTANEOUS PEDICLE SCREW 1 LEVEL N/A 04/05/2017   Procedure: LUMBAR PERCUTANEOUS PEDICLE SCREW LUMBAR THREE-FOUR;  Surgeon: Kary Kos, MD;  Location: [facility];  Service: Neurosurgery;  Laterality: N/A;  . ORIF CALCANEOUS FRACTURE Right 09/19/2014   Procedure: OPEN REDUCTION INTERNAL FIXATION (ORIF) CALCANEOUS FRACTURE;  Surgeon: Newt Minion, MD;  Location: [facility];  Service: Orthopedics;  Laterality: Right;  . ORIF CALCANEOUS FRACTURE Left 09/19/2014   Procedure: OPEN REDUCTION INTERNAL  FIXATION (ORIF) CALCANEOUS FRACTURE;  Surgeon: Newt Minion, MD;  Location: [facility];  Service: Orthopedics;  Laterality: Left;   Social History   Occupational History    Comment: disabled  Tobacco Use  . Smoking status: Former Smoker    Packs/day: 1.00    Years: 10.00    Pack years: 10.00    Types: Cigars    Start date: 10/08/1976    Quit date: 09/07/2014    Years since quitting: 4.7  . Smokeless tobacco: Never Used  . Tobacco comment: quit smoking cegar 09/10/2018  Substance and Sexual Activity  . Alcohol use: No    Comment: 06/26/16 quit 1998  . Drug use: No  . Sexual activity: Yes    Birth control/protection: None

## 2019-06-01 ENCOUNTER — Encounter: Payer: Self-pay | Admitting: Orthopedic Surgery

## 2019-06-01 NOTE — Progress Notes (Addendum)
Office Visit Note   Patient: Bradley Espinoza           Date of Birth: May 22, 1956           MRN: [id number] Visit Date: 05/14/2019              Requested by: Celene Squibb, MD [place],  [place] [address] PCP: Celene Squibb, MD  Chief Complaint  Patient presents with  . Right Ankle - Follow-up    12/23/18 right ankle fusion       HPI: Patient is a 63 year old gentleman who is status post tibial calcaneal fusion with recent removal of retained hardware.  Patient states he felt best in a cast he is currently ambulating with regular shoewear or crutches patient states he went about 2 weeks without his crutches before he started having pain which he describes as stinging and burning.  Patient states that he has had GI upset with multiple anti-inflammatories.   Assessment & Plan: Visit Diagnoses:  1. H/O ankle fusion     Plan: Patient wants to wear his shoe and brace.  Recommended a short leg cast with a heel lift and reinforced foot.  Follow-up in 3 weeks if he is still symptomatic we will need to place him back in a short leg weightbearing cast.  Advised against taking anti-inflammatories due to his GI upset.  Follow-Up Instructions: Return in about 3 weeks (around 06/04/2019).   Ortho Exam  Patient is alert, oriented, no adenopathy, well-dressed, normal affect, normal respiratory effort. Examination patient states he put pressure on the forefoot with his knee hyperflexed and he states that this caused increased pain.  He states he feels best with his shoe lift and brace in place.  Patient states he has pain in the ankle not in the heel and states he also has left heel pain.  Patient states he has been taking 10 mg oxycodone tablets for his back and foot but he states it does not help with the ankle symptoms.  Imaging: No results found. No images are attached to the encounter.  Labs: Lab Results  Component Value Date   HGBA1C 5.1 06/26/2016   ESRSEDRATE 9  12/08/2018   REPTSTATUS 12/28/2018 FINAL 12/23/2018   GRAMSTAIN  12/23/2018    FEW WBC PRESENT,BOTH PMN AND MONONUCLEAR RARE GRAM POSITIVE COCCI Performed at [facility], [address], [place], [place] [address]    CULT  12/23/2018    FEW STAPHYLOCOCCUS AUREUS NO ANAEROBES ISOLATED; CULTURE IN PROGRESS FOR 5 DAYS    LABORGA STAPHYLOCOCCUS AUREUS 12/23/2018     Lab Results  Component Value Date   ALBUMIN 4.3 08/12/2015   ALBUMIN 4.1 05/06/2015   ALBUMIN 4.0 09/15/2014    No results found for: MG No results found for: VD25OH  No results found for: PREALBUMIN CBC EXTENDED Latest Ref Rng & Units 12/19/2018 11/26/2018 09/01/2018  WBC 4.0 - 10.5 K/uL 6.9 5.3 6.8  RBC 4.22 - 5.81 MIL/uL 4.03(L) 4.67 4.65  HGB 13.0 - 17.0 g/dL 11.8(L) 14.1 14.9  HCT 39.0 - 52.0 % 36.5(L) 44.8 45.1  PLT 150 - 400 K/uL 214 198 267  NEUTROABS 1.7 - 7.7 K/uL - - -  LYMPHSABS 0.7 - 4.0 K/uL - - -     Body mass index is 22.65 kg/m.  Orders:  Orders Placed This Encounter  Procedures  . XR Ankle Complete Right   No orders of the defined types were placed in this  encounter.    Procedures: No procedures performed  Clinical Data: No additional findings.  ROS:  All other systems negative, except as noted in the HPI. Review of Systems  Objective: Vital Signs: Ht 6' (1.829 m)   Wt 167 lb (75.8 kg)   BMI 22.65 kg/m   Specialty Comments:  No specialty comments available.  PMFS History: Patient Active Problem List   Diagnosis Date Noted  . Nausea without vomiting 05/13/2019  . Abdominal bloating 05/13/2019  . Wound infection following procedure 12/19/2018  . Malunion of joint fusion ([facility]) 11/26/2018  . 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[facility])   . Status post lumbar spinal fusion 09/10/2018  . S/P ankle fusion 11/27/2017  . Nonunion of subtalar arthrodesis   . Avascular necrosis of talus, right ([place])   . Post-traumatic osteoarthritis, left ankle and foot 10/22/2017  . DDD (degenerative  disc disease), lumbar 04/05/2017  . Fracture of L2 vertebra ([facility]) 01/12/2015  . Acute osteomyelitis of calcaneum ([facility]) 10/09/2014  . Dysuria 10/05/2014  . Cellulitis 10/05/2014  . Fall from ladder 09/21/2014  . L2 vertebral fracture ([facility]) 09/21/2014  . Bilateral calcaneal fractures 09/21/2014  . Chronic pain 09/21/2014  . Acute blood loss anemia 09/21/2014  . Open fracture of both calcanei 09/15/2014   Past Medical History:  Diagnosis Date  . Anxiety   . Arthritis    low back pain, lumbar radiculopathy  . Depression   . Fracture    B/L ankles  . GERD (gastroesophageal reflux disease)   . Headache(784.0)    allergy related   . History of kidney stones   . History of stomach ulcers   . Retained orthopedic hardware    failed retained hardware right foot  . Wears glasses     Family History  Problem Relation Age of Onset  . Diabetes Father   . Cancer Other     Past Surgical History:  Procedure Laterality Date  . ANKLE FUSION Right 05/11/2015   Procedure: Right Posterior Arthroscopic Subtalar Arthrodesis;  Surgeon: Newt Minion, MD;  Location: [facility];  Service: Orthopedics;  Laterality: Right;  . ANKLE FUSION Right 11/27/2017   Procedure: RIGHT TIBIOCALCANEAL FUSION;  Surgeon: Newt Minion, MD;  Location: [facility];  Service: Orthopedics;  Laterality: Right;  . ANKLE FUSION Right 11/26/2018   Procedure: RIGHT ANTERIOR ANKLE FUSION, APPLY VAC;  Surgeon: Newt Minion, MD;  Location: [facility];  Service: Orthopedics;  Laterality: Right;  . ANTERIOR LAT LUMBAR FUSION N/A 04/05/2017   Procedure: Extreme Lateral Interbody Fusion - Lumbar three-lumbar four ,exploration of fusion Posterior augmentation with globus addition Removal hardware Lumbar one-three. Lumbar four-sacral one,  Removal internal bone growth stimulator;  Surgeon: Kary Kos, MD;  Location: [facility];  Service: Neurosurgery;  Laterality: N/A;  . APPLICATION OF WOUND VAC  12/23/2018   Procedure: Application Of Wound Vac;  Surgeon:  Newt Minion, MD;  Location: [facility];  Service: Orthopedics;;  . BACK SURGERY  2004   x 2  . CERVICAL SPINE SURGERY  2008  . ESOPHAGOGASTRODUODENOSCOPY    . HARDWARE REMOVAL Right 10/09/2014   Procedure: Removal Deep Hardware, Irrigation and Debridement Calcaneus, Place Antibiotic Beads and Wound VAC ;  Surgeon: Newt Minion, MD;  Location: [facility];  Service: Orthopedics;  Laterality: Right;  . HARDWARE REMOVAL Right 08/12/2015   Procedure: Removal Deep Hardware Right Foot;  Surgeon: Newt Minion, MD;  Location: [facility];  Service: Orthopedics;  Laterality: Right;  . HARDWARE REMOVAL Right 11/26/2018  Procedure: REMOVAL RIGHT TIBIOCALCANEAL NAIL;  Surgeon: Newt Minion, MD;  Location: [employer] [facility];  Service: Orthopedics;  Laterality: Right;  . HARDWARE REMOVAL Right 12/23/2018   Procedure: RIGHT ANKLE REMOVE HARDWARE, PLACE ANTIBIOTIC BEADS and placement of wound vac;  Surgeon: Newt Minion, MD;  Location: [facility];  Service: Orthopedics;  Laterality: Right;  . I&D EXTREMITY Right 09/15/2014   Procedure: IRRIGATION AND DEBRIDEMENT Ankle;  Surgeon: Renette Butters, MD;  Location: [facility];  Service: Orthopedics;  Laterality: Right;  . INGUINAL HERNIA REPAIR Bilateral   . LAMINECTOMY WITH POSTERIOR LATERAL ARTHRODESIS LEVEL 4 N/A 09/10/2018   Procedure: Posterior Lateral Fusion - Thoracic Eleven-Thoracic Twelve - Thoracic Twelve-Lumbar One - Lumbar One-Lumbar Two - Lumbar Two-Lumbar Three with instrumentaion and PLA;  Surgeon: Kary Kos, MD;  Location: [facility];  Service: Neurosurgery;  Laterality: N/A;  Posterior Lateral Fusion - Thoracic Eleven-Thoracic Twelve - Thoracic Twelve-Lumbar One - Lumbar One-Lumbar Two - Lumbar Two-Lumbar Thre  . LUMBAR PERCUTANEOUS PEDICLE SCREW 1 LEVEL N/A 04/05/2017   Procedure: LUMBAR PERCUTANEOUS PEDICLE SCREW LUMBAR THREE-FOUR;  Surgeon: Kary Kos, MD;  Location: [facility];  Service: Neurosurgery;  Laterality: N/A;  . ORIF CALCANEOUS FRACTURE Right 09/19/2014   Procedure: OPEN  REDUCTION INTERNAL FIXATION (ORIF) CALCANEOUS FRACTURE;  Surgeon: Newt Minion, MD;  Location: [facility];  Service: Orthopedics;  Laterality: Right;  . ORIF CALCANEOUS FRACTURE Left 09/19/2014   Procedure: OPEN REDUCTION INTERNAL FIXATION (ORIF) CALCANEOUS FRACTURE;  Surgeon: Newt Minion, MD;  Location: [facility];  Service: Orthopedics;  Laterality: Left;   Social History   Occupational History    Comment: disabled  Tobacco Use  . Smoking status: Former Smoker    Packs/day: 1.00    Years: 10.00    Pack years: 10.00    Types: Cigars    Start date: 10/08/1976    Quit date: 09/07/2014    Years since quitting: 4.7  . Smokeless tobacco: Never Used  . Tobacco comment: quit smoking cegar 09/10/2018  Substance and Sexual Activity  . Alcohol use: No    Comment: 06/26/16 quit 1998  . Drug use: No  . Sexual activity: Yes    Birth control/protection: None

## 2019-06-03 ENCOUNTER — Encounter (HOSPITAL_COMMUNITY)
Admission: RE | Admit: 2019-06-03 | Discharge: 2019-06-03 | Disposition: A | Discharge status: Home / Self Care | Payer: PPO | Source: Ambulatory Visit | Attending: Internal Medicine | Admitting: Internal Medicine

## 2019-06-03 ENCOUNTER — Other Ambulatory Visit: Payer: Self-pay

## 2019-06-03 ENCOUNTER — Encounter (HOSPITAL_COMMUNITY): Payer: Self-pay

## 2019-06-03 ENCOUNTER — Other Ambulatory Visit (HOSPITAL_COMMUNITY)
Admission: RE | Admit: 2019-06-03 | Discharge: 2019-06-03 | Disposition: A | Discharge status: Home / Self Care | Payer: PPO | Source: Ambulatory Visit | Attending: Internal Medicine | Admitting: Internal Medicine

## 2019-06-03 DIAGNOSIS — Z01812 Encounter for preprocedural laboratory examination: Secondary | ICD-10-CM | POA: Insufficient documentation

## 2019-06-03 DIAGNOSIS — Z20828 Contact with and (suspected) exposure to other viral communicable diseases: Secondary | ICD-10-CM | POA: Insufficient documentation

## 2019-06-03 LAB — SARS CORONAVIRUS 2 (TAT 6-24 HRS): SARS Coronavirus 2: NEGATIVE

## 2019-06-05 ENCOUNTER — Ambulatory Visit (HOSPITAL_COMMUNITY)
Admission: RE | Admit: 2019-06-05 | Discharge: 2019-06-05 | Disposition: A | Discharge status: Home / Self Care | Payer: PPO | Attending: Internal Medicine | Admitting: Internal Medicine

## 2019-06-05 ENCOUNTER — Ambulatory Visit (HOSPITAL_COMMUNITY): Payer: PPO | Admitting: Anesthesiology

## 2019-06-05 ENCOUNTER — Other Ambulatory Visit: Payer: Self-pay

## 2019-06-05 ENCOUNTER — Encounter (HOSPITAL_COMMUNITY)
Admission: RE | Disposition: A | Discharge status: Home / Self Care | Payer: Self-pay | Source: Home / Self Care | Attending: Internal Medicine

## 2019-06-05 ENCOUNTER — Encounter (HOSPITAL_COMMUNITY): Payer: Self-pay | Admitting: *Deleted

## 2019-06-05 DIAGNOSIS — K449 Diaphragmatic hernia without obstruction or gangrene: Secondary | ICD-10-CM | POA: Insufficient documentation

## 2019-06-05 DIAGNOSIS — R1013 Epigastric pain: Secondary | ICD-10-CM | POA: Insufficient documentation

## 2019-06-05 DIAGNOSIS — Z79899 Other long term (current) drug therapy: Secondary | ICD-10-CM | POA: Diagnosis not present

## 2019-06-05 DIAGNOSIS — Z1211 Encounter for screening for malignant neoplasm of colon: Secondary | ICD-10-CM | POA: Diagnosis not present

## 2019-06-05 DIAGNOSIS — K219 Gastro-esophageal reflux disease without esophagitis: Secondary | ICD-10-CM | POA: Insufficient documentation

## 2019-06-05 DIAGNOSIS — D125 Benign neoplasm of sigmoid colon: Secondary | ICD-10-CM | POA: Insufficient documentation

## 2019-06-05 DIAGNOSIS — Z8711 Personal history of peptic ulcer disease: Secondary | ICD-10-CM | POA: Diagnosis not present

## 2019-06-05 DIAGNOSIS — R140 Abdominal distension (gaseous): Secondary | ICD-10-CM | POA: Diagnosis not present

## 2019-06-05 DIAGNOSIS — K3189 Other diseases of stomach and duodenum: Secondary | ICD-10-CM | POA: Diagnosis not present

## 2019-06-05 DIAGNOSIS — K644 Residual hemorrhoidal skin tags: Secondary | ICD-10-CM | POA: Insufficient documentation

## 2019-06-05 DIAGNOSIS — K2970 Gastritis, unspecified, without bleeding: Secondary | ICD-10-CM | POA: Insufficient documentation

## 2019-06-05 DIAGNOSIS — R110 Nausea: Secondary | ICD-10-CM

## 2019-06-05 DIAGNOSIS — K228 Other specified diseases of esophagus: Secondary | ICD-10-CM | POA: Diagnosis not present

## 2019-06-05 DIAGNOSIS — K5730 Diverticulosis of large intestine without perforation or abscess without bleeding: Secondary | ICD-10-CM | POA: Diagnosis not present

## 2019-06-05 DIAGNOSIS — Z87891 Personal history of nicotine dependence: Secondary | ICD-10-CM | POA: Insufficient documentation

## 2019-06-05 DIAGNOSIS — K635 Polyp of colon: Secondary | ICD-10-CM | POA: Diagnosis not present

## 2019-06-05 HISTORY — PX: COLONOSCOPY WITH PROPOFOL: SHX5780

## 2019-06-05 HISTORY — PX: BIOPSY: SHX5522

## 2019-06-05 HISTORY — PX: ESOPHAGOGASTRODUODENOSCOPY (EGD) WITH PROPOFOL: SHX5813

## 2019-06-05 HISTORY — PX: POLYPECTOMY: SHX5525

## 2019-06-05 SURGERY — ESOPHAGOGASTRODUODENOSCOPY (EGD) WITH PROPOFOL
Anesthesia: General

## 2019-06-05 MED ORDER — LACTATED RINGERS IV SOLN
INTRAVENOUS | Status: DC | PRN
  Administered 2019-06-05: 11:00:00 via INTRAVENOUS

## 2019-06-05 MED ORDER — PROPOFOL 500 MG/50ML IV EMUL
INTRAVENOUS | Status: DC | PRN
  Administered 2019-06-05: 150 ug/kg/min via INTRAVENOUS

## 2019-06-05 MED ORDER — LACTATED RINGERS IV SOLN: INTRAVENOUS | Status: DC | PRN

## 2019-06-05 MED ORDER — KETAMINE HCL 50 MG/5ML IJ SOSY
PREFILLED_SYRINGE | INTRAMUSCULAR | Status: AC
  Filled 2019-06-05: qty 5

## 2019-06-05 MED ORDER — CHLORHEXIDINE GLUCONATE CLOTH 2 % EX PADS: 6.0000 | MEDICATED_PAD | Freq: Once | CUTANEOUS | Status: DC

## 2019-06-05 MED ORDER — PROPOFOL 10 MG/ML IV BOLUS
INTRAVENOUS | Status: AC
  Filled 2019-06-05: qty 40

## 2019-06-05 MED ORDER — KETAMINE HCL 10 MG/ML IJ SOLN
INTRAMUSCULAR | Status: DC | PRN
  Administered 2019-06-05: 10 mg via INTRAVENOUS

## 2019-06-05 MED ORDER — PROPOFOL 10 MG/ML IV BOLUS
INTRAVENOUS | Status: DC | PRN
  Administered 2019-06-05 (×2): 20 mg via INTRAVENOUS

## 2019-06-05 MED ORDER — LACTATED RINGERS IV SOLN
Freq: Once | INTRAVENOUS | Status: AC
  Administered 2019-06-05: 11:00:00 via INTRAVENOUS

## 2019-06-05 NOTE — Op Note (Signed)
[facility] Patient Name: Bradley Espinoza Procedure Date: 06/05/2019 11:31 AM MRN: [id number] Date of Birth: 09/18/1956 Attending MD: Hildred Laser , MD CSN: [id number] Age: 63 Admit Type: Outpatient Procedure:                Colonoscopy Indications:              Screening for colorectal malignant neoplasm Providers:                Hildred Laser, MD, Hinton Rao, RN, Raphael Gibney, Technician Referring MD:             Delphina Cahill, MD Medicines:                Propofol per Anesthesia Complications:            No immediate complications. Estimated Blood Loss:     Estimated blood loss was minimal. Procedure:                Pre-Anesthesia Assessment:                           - Prior to the procedure, a History and Physical                            was performed, and patient medications and                            allergies were reviewed. The patient's tolerance of                            previous anesthesia was also reviewed. The risks                            and benefits of the procedure and the sedation                            options and risks were discussed with the patient.                            All questions were answered, and informed consent                            was obtained. Prior Anticoagulants: The patient has                            taken no previous anticoagulant or antiplatelet                            agents except for NSAID medication. ASA Grade                            Assessment: III - A patient with severe systemic  disease. After reviewing the risks and benefits,                            the patient was deemed in satisfactory condition to                            undergo the procedure.                           - Prior to the procedure, a History and Physical                            was performed, and patient medications and                            allergies were  reviewed. The patient's tolerance of                            previous anesthesia was also reviewed. The risks                            and benefits of the procedure and the sedation                            options and risks were discussed with the patient.                            All questions were answered, and informed consent                            was obtained. After reviewing the risks and                            benefits, the patient was deemed in satisfactory                            condition to undergo the procedure.                           After obtaining informed consent, the colonoscope                            was passed under direct vision. Throughout the                            procedure, the patient's blood pressure, pulse, and                            oxygen saturations were monitored continuously. The                            [id number]-[id number] scope was introduced through  the anus and advanced to the the cecum, identified                            by appendiceal orifice and ileocecal valve. The                            ileocecal valve, appendiceal orifice, and rectum                            were photographed. . Scope In: 11:33:01 AM Scope Out: 11:49:02 AM Scope Withdrawal Time: 0 hours 11 minutes 29 seconds  Total Procedure Duration: 0 hours 16 minutes 1 second  Findings:      The perianal and digital rectal examinations were normal.      A small polyp was found in the cecum. The polyp was sessile. The       pathology specimen was placed into Bottle Number 2.      Two sessile polyps were found in the distal sigmoid colon and ascending       colon. The polyps were small in size. These polyps were removed with a       cold snare. Resection and retrieval were complete. The pathology       specimen was placed into Bottle Number 2.      Scattered diverticula were found in the sigmoid colon and ascending        colon.      External hemorrhoids were found during retroflexion. The hemorrhoids       were small. Impression:               - One small polyp in the cecum.                           - Two small polyps in the distal sigmoid colon and                            in the ascending colon, removed with a cold snare.                            Resected and retrieved.                           - Diverticulosis in the sigmoid colon and in the                            ascending colon.                           - External hemorrhoids. Moderate Sedation:      Per Anesthesia Care Recommendation:           - Patient has a contact number available for                            emergencies. The signs and symptoms of potential                            delayed complications were discussed with  the                            patient. Return to normal activities tomorrow.                            Written discharge instructions were provided to the                            patient.                           - High fiber diet today.                           - Continue present medications.                           - No aspirin, ibuprofen, naproxen, or other                            non-steroidal anti-inflammatory drugs for 1 day.                           - Await pathology results.                           - Repeat colonoscopy for surveillance based on                            pathology results. Procedure Code(s):        --- Professional ---                           [phone], Colonoscopy, flexible; with removal of                            tumor(s), polyp(s), or other lesion(s) by snare                            technique Diagnosis Code(s):        --- Professional ---                           K63.5, Polyp of colon                           Z12.11, Encounter for screening for malignant                            neoplasm of colon                           K64.4, Residual hemorrhoidal skin  tags                           K57.30, Diverticulosis of large intestine without  perforation or abscess without bleeding CPT copyright 2019 [employer]. All rights reserved. The codes documented in this report are preliminary and upon coder review may  be revised to meet current compliance requirements. Hildred Laser, MD Hildred Laser, MD 06/05/2019 12:02:34 PM This report has been signed electronically. Number of Addenda: 0

## 2019-06-05 NOTE — Anesthesia Preprocedure Evaluation (Addendum)
Anesthesia Evaluation  Patient identified by MRN, date of birth, ID band Patient awake    Reviewed: Allergy & Precautions, NPO status , Patient's Chart, lab work & pertinent test results  Airway Mallampati: II  TM Distance: >3 FB Neck ROM: Full    Dental  (+) Partial Upper, Dental Advisory Given Lower permanent bridge :   Pulmonary former smoker,    Pulmonary exam normal        Cardiovascular METS: right foot pain, walks with crutches. Normal cardiovascular exam     Neuro/Psych  Headaches, PSYCHIATRIC DISORDERS Anxiety Depression    GI/Hepatic GERD  Medicated,  Endo/Other    Renal/GU Renal InsufficiencyRenal disease     Musculoskeletal  (+) Arthritis  (back pain), Osteoarthritis,  Back pain and right foot pain   Abdominal   Peds  Hematology  (+) anemia ,   Anesthesia Other Findings   Reproductive/Obstetrics                         Anesthesia Physical Anesthesia Plan  ASA: III  Anesthesia Plan: General   Post-op Pain Management:    Induction:   PONV Risk Score and Plan:   Airway Management Planned: Natural Airway, Nasal Cannula and Simple Face Mask  Additional Equipment:   Intra-op Plan:   Post-operative Plan:   Informed Consent: I have reviewed the patients History and Physical, chart, labs and discussed the procedure including the risks, benefits and alternatives for the proposed anesthesia with the patient or authorized representative who has indicated his/her understanding and acceptance.     Dental advisory given  Plan Discussed with: CRNA  Anesthesia Plan Comments:        Anesthesia Quick Evaluation

## 2019-06-05 NOTE — Discharge Instructions (Signed)
No aspirin or NSAIDs for 24 hours.  Thereafter keep Advil use to minimum.  Always take with food or snack. Resume other medications as before. High-fiber diet. No driving for 24 hours. Physician will call with biopsy results.   Colonoscopy, Adult, Care After This sheet gives you information about how to care for yourself after your procedure. Your doctor may also give you more specific instructions. If you have problems or questions, call your doctor. What can I expect after the procedure? After the procedure, it is common to have:  A small amount of blood in your poop for 24 hours.  Some gas.  Mild cramping or bloating in your belly. Follow these instructions at home: General instructions  For the first 24 hours after the procedure: ? Do not drive or use machinery. ? Do not sign important documents. ? Do not drink alcohol. ? Do your daily activities more slowly than normal. ? Eat foods that are soft and easy to digest.  Take over-the-counter or prescription medicines only as told by your doctor. To help cramping and bloating:   Try walking around.  Put heat on your belly (abdomen) as told by your doctor. Use a heat source that your doctor recommends, such as a moist heat pack or a heating pad. ? Put a towel between your skin and the heat source. ? Leave the heat on for 20-30 minutes. ? Remove the heat if your skin turns bright red. This is especially important if you cannot feel pain, heat, or cold. You can get burned. Eating and drinking   Drink enough fluid to keep your pee (urine) clear or pale yellow.  Return to your normal diet as told by your doctor. Avoid heavy or fried foods that are hard to digest.  Avoid drinking alcohol for as long as told by your doctor. Contact a doctor if:  You have blood in your poop (stool) 2-3 days after the procedure. Get help right away if:  You have more than a small amount of blood in your poop.  You see large clumps of tissue  (blood clots) in your poop.  Your belly is swollen.  You feel sick to your stomach (nauseous).  You throw up (vomit).  You have a fever.  You have belly pain that gets worse, and medicine does not help your pain. Summary  After the procedure, it is common to have a small amount of blood in your poop. You may also have mild cramping and bloating in your belly.  For the first 24 hours after the procedure, do not drive or use machinery, do not sign important documents, and do not drink alcohol.  Get help right away if you have a lot of blood in your poop, feel sick to your stomach, have a fever, or have more belly pain. This information is not intended to replace advice given to you by your health care provider. Make sure you discuss any questions you have with your health care provider. Document Released: 10/27/2010 Document Revised: 07/25/2017 Document Reviewed: 06/18/2016 Elsevier Patient Education  2020 [place].   Upper Endoscopy, Adult, Care After This sheet gives you information about how to care for yourself after your procedure. Your health care provider may also give you more specific instructions. If you have problems or questions, contact your health care provider. What can I expect after the procedure? After the procedure, it is common to have:  A sore throat.  Mild stomach pain or discomfort.  Bloating.  Nausea.  Follow these instructions at home:   Follow instructions from your health care provider about what to eat or drink after your procedure.  Return to your normal activities as told by your health care provider. Ask your health care provider what activities are safe for you.  Take over-the-counter and prescription medicines only as told by your health care provider.  Do not drive for 24 hours if you were given a sedative during your procedure.  Keep all follow-up visits as told by your health care provider. This is important. Contact a health care  provider if you have:  A sore throat that lasts longer than one day.  Trouble swallowing. Get help right away if:  You vomit blood or your vomit looks like coffee grounds.  You have: ? A fever. ? Bloody, black, or tarry stools. ? A severe sore throat or you cannot swallow. ? Difficulty breathing. ? Severe pain in your chest or abdomen. Summary  After the procedure, it is common to have a sore throat, mild stomach discomfort, bloating, and nausea.  Do not drive for 24 hours if you were given a sedative during the procedure.  Follow instructions from your health care provider about what to eat or drink after your procedure.  Return to your normal activities as told by your health care provider. This information is not intended to replace advice given to you by your health care provider. Make sure you discuss any questions you have with your health care provider. Document Released: 03/25/2012 Document Revised: 03/18/2018 Document Reviewed: 02/24/2018 Elsevier Patient Education  2020 [place] After These instructions provide you with information about caring for yourself after your procedure. Your health care provider may also give you more specific instructions. Your treatment has been planned according to current medical practices, but problems sometimes occur. Call your health care provider if you have any problems or questions after your procedure. What can I expect after the procedure? After your procedure, you may:  Feel sleepy for several hours.  Feel clumsy and have poor balance for several hours.  Feel forgetful about what happened after the procedure.  Have poor judgment for several hours.  Feel nauseous or vomit.  Have a sore throat if you had a breathing tube during the procedure. Follow these instructions at home: For at least 24 hours after the procedure:      Have a responsible adult stay with you. It is important  to have someone help care for you until you are awake and alert.  Rest as needed.  Do not: ? Participate in activities in which you could fall or become injured. ? Drive. ? Use heavy machinery. ? Drink alcohol. ? Take sleeping pills or medicines that cause drowsiness. ? Make important decisions or sign legal documents. ? Take care of children on your own. Eating and drinking  Follow the diet that is recommended by your health care provider.  If you vomit, drink water, juice, or soup when you can drink without vomiting.  Make sure you have little or no nausea before eating solid foods. General instructions  Take over-the-counter and prescription medicines only as told by your health care provider.  If you have sleep apnea, surgery and certain medicines can increase your risk for breathing problems. Follow instructions from your health care provider about wearing your sleep device: ? Anytime you are sleeping, including during daytime naps. ? While taking prescription pain medicines, sleeping medicines, or medicines that make  you drowsy.  If you smoke, do not smoke without supervision.  Keep all follow-up visits as told by your health care provider. This is important. Contact a health care provider if:  You keep feeling nauseous or you keep vomiting.  You feel light-headed.  You develop a rash.  You have a fever. Get help right away if:  You have trouble breathing. Summary  For several hours after your procedure, you may feel sleepy and have poor judgment.  Have a responsible adult stay with you for at least 24 hours or until you are awake and alert. This information is not intended to replace advice given to you by your health care provider. Make sure you discuss any questions you have with your health care provider. Document Released: 01/15/2016 Document Revised: 12/23/2017 Document Reviewed: 01/15/2016 Elsevier Patient Education  2020 [place]  Endoscopy, Adult, Care After This sheet gives you information about how to care for yourself after your procedure. Your health care provider may also give you more specific instructions. If you have problems or questions, contact your health care provider. What can I expect after the procedure? After the procedure, it is common to have:  A sore throat.  Mild stomach pain or discomfort.  Bloating.  Nausea. Follow these instructions at home:   Follow instructions from your health care provider about what to eat or drink after your procedure.  Return to your normal activities as told by your health care provider. Ask your health care provider what activities are safe for you.  Take over-the-counter and prescription medicines only as told by your health care provider.  Do not drive for 24 hours if you were given a sedative during your procedure.  Keep all follow-up visits as told by your health care provider. This is important. Contact a health care provider if you have:  A sore throat that lasts longer than one day.  Trouble swallowing. Get help right away if:  You vomit blood or your vomit looks like coffee grounds.  You have: ? A fever. ? Bloody, black, or tarry stools. ? A severe sore throat or you cannot swallow. ? Difficulty breathing. ? Severe pain in your chest or abdomen. Summary  After the procedure, it is common to have a sore throat, mild stomach discomfort, bloating, and nausea.  Do not drive for 24 hours if you were given a sedative during the procedure.  Follow instructions from your health care provider about what to eat or drink after your procedure.  Return to your normal activities as told by your health care provider. This information is not intended to replace advice given to you by your health care provider. Make sure you discuss any questions you have with your health care provider. Document Released: 03/25/2012 Document Revised: 03/18/2018 Document  Reviewed: 02/24/2018 Elsevier Patient Education  2020 [employer].

## 2019-06-05 NOTE — Op Note (Signed)
[facility] Patient Name: Bradley Espinoza Procedure Date: 06/05/2019 10:57 AM MRN: [id number] Date of Birth: 24-Mar-1956 Attending MD: Hildred Laser , MD CSN: [id number] Age: 63 Admit Type: Outpatient Procedure:                Upper GI endoscopy Indications:              Epigastric abdominal pain, Abdominal bloating Providers:                Hildred Laser, MD, Hinton Rao, RN Referring MD:             Delphina Cahill, MD Medicines:                Propofol per Anesthesia Complications:            No immediate complications. Estimated Blood Loss:     Estimated blood loss: none. Estimated blood loss                            was minimal. Procedure:                Pre-Anesthesia Assessment:                           - Prior to the procedure, a History and Physical                            was performed, and patient medications and                            allergies were reviewed. The patient's tolerance of                            previous anesthesia was also reviewed. The risks                            and benefits of the procedure and the sedation                            options and risks were discussed with the patient.                            All questions were answered, and informed consent                            was obtained. Prior Anticoagulants: The patient has                            taken no previous anticoagulant or antiplatelet                            agents except for NSAID medication. ASA Grade                            Assessment: III - A patient with severe systemic  disease. After reviewing the risks and benefits,                            the patient was deemed in satisfactory condition to                            undergo the procedure.                           After obtaining informed consent, the endoscope was                            passed under direct vision. Throughout the   procedure, the patient's blood pressure, pulse, and                            oxygen saturations were monitored continuously. The                            [id number]-[id number]) scope was introduced through the                            mouth, and advanced to the second part of duodenum.                            The upper GI endoscopy was accomplished without                            difficulty. The patient tolerated the procedure                            well. Scope In: 11:19:50 AM Scope Out: 11:27:49 AM Total Procedure Duration: 0 hours 7 minutes 59 seconds  Findings:      The examined esophagus was normal.      The Z-line was irregular and was found 38 cm from the incisors.      A 2 cm hiatal hernia was present.      Patchy mild inflammation characterized by congestion (edema) and       erythema was found in the gastric antrum. Biopsies were taken with a       cold forceps for histology. The pathology specimen was placed into       Bottle Number 1.      The exam of the stomach was otherwise normal.      The duodenal bulb and second portion of the duodenum were normal. Impression:               - Normal esophagus.                           - Z-line irregular, 38 cm from the incisors.                           - 2 cm hiatal hernia.                           - Gastritis. Biopsied.                           -  Normal duodenal bulb and second portion of the                            duodenum. Moderate Sedation:      Per Anesthesia Care Recommendation:           - Patient has a contact number available for                            emergencies. The signs and symptoms of potential                            delayed complications were discussed with the                            patient. Return to normal activities tomorrow.                            Written discharge instructions were provided to the                            patient.                           - High fiber  diet today.                           - Continue present medications.                           - No aspirin, ibuprofen, naproxen, or other                            non-steroidal anti-inflammatory drugs for 1 day.                           - Await pathology results. Procedure Code(s):        --- Professional ---                           [phone], Esophagogastroduodenoscopy, flexible,                            transoral; with biopsy, single or multiple Diagnosis Code(s):        --- Professional ---                           K22.8, Other specified diseases of esophagus                           K44.9, Diaphragmatic hernia without obstruction or                            gangrene                           K29.70, Gastritis, unspecified, without bleeding  R10.13, Epigastric pain                           R14.0, Abdominal distension (gaseous) CPT copyright 2019 American Medical Association. All rights reserved. The codes documented in this report are preliminary and upon coder review may  be revised to meet current compliance requirements. Hildred Laser, MD Hildred Laser, MD 06/05/2019 11:56:53 AM This report has been signed electronically. Number of Addenda: 0

## 2019-06-05 NOTE — H&P (Signed)
Bradley Espinoza is an 63 y.o. male.   Chief Complaint: Patient is here for EGD and colonoscopy. HPI: Patient is 63 year old Caucasian male was history of peptic ulcer disease who presents with few months history of postprandial bloating epigastric pain and nausea.  He takes ibuprofen usually 800 mg almost every day.  He states his stools have been black only when he has taken Pepto-Bismol.  He has not had any vomiting or weight loss.  He had ultrasound in March this year.  He had single large gallstone.  It is possible that his symptoms are due to cholelithiasis.  Given history of peptic ulcer disease and the fact that he is on NSAIDs EGD was recommended to rule out peptic ulcer disease. He is also undergoing screening colonoscopy.  He has had constipation since he has been taking pain medication for back pain as well as left ankle pain.  He did notice some blood after he took the prep but no history of rectal bleeding prior to that. He quit smoking cigars about 8 months ago.  Does not drink alcohol. Family history is negative for CRC. Patient states he fell off the tree 5 years ago and has had multiple surgeries on his right ankle.  Now it is in a cast.  Past Medical History:  Diagnosis Date  . Anxiety   . Arthritis    low back pain, lumbar radiculopathy  . Depression   . Fracture    B/L ankles  . GERD (gastroesophageal reflux disease)   . Headache(784.0)    allergy related   . History of kidney stones   . History of stomach ulcers   . Retained orthopedic hardware    failed retained hardware right foot  . Wears glasses     Past Surgical History:  Procedure Laterality Date  . ANKLE FUSION Right 05/11/2015   Procedure: Right Posterior Arthroscopic Subtalar Arthrodesis;  Surgeon: Newt Minion, MD;  Location: [place];  Service: Orthopedics;  Laterality: Right;  . ANKLE FUSION Right 11/27/2017   Procedure: RIGHT TIBIOCALCANEAL FUSION;  Surgeon: Newt Minion, MD;  Location: [place];   Service: Orthopedics;  Laterality: Right;  . ANKLE FUSION Right 11/26/2018   Procedure: RIGHT ANTERIOR ANKLE FUSION, APPLY VAC;  Surgeon: Newt Minion, MD;  Location: [place];  Service: Orthopedics;  Laterality: Right;  . ANTERIOR LAT LUMBAR FUSION N/A 04/05/2017   Procedure: Extreme Lateral Interbody Fusion - Lumbar three-lumbar four ,exploration of fusion Posterior augmentation with globus addition Removal hardware Lumbar one-three. Lumbar four-sacral one,  Removal internal bone growth stimulator;  Surgeon: Kary Kos, MD;  Location: [place];  Service: Neurosurgery;  Laterality: N/A;  . APPLICATION OF WOUND VAC  12/23/2018   Procedure: Application Of Wound Vac;  Surgeon: Newt Minion, MD;  Location: [place];  Service: Orthopedics;;  . BACK SURGERY  2004   x 2  . CERVICAL SPINE SURGERY  2008  . ESOPHAGOGASTRODUODENOSCOPY    . HARDWARE REMOVAL Right 10/09/2014   Procedure: Removal Deep Hardware, Irrigation and Debridement Calcaneus, Place Antibiotic Beads and Wound VAC ;  Surgeon: Newt Minion, MD;  Location: [place];  Service: Orthopedics;  Laterality: Right;  . HARDWARE REMOVAL Right 08/12/2015   Procedure: Removal Deep Hardware Right Foot;  Surgeon: Newt Minion, MD;  Location: [place];  Service: Orthopedics;  Laterality: Right;  . HARDWARE REMOVAL Right 11/26/2018   Procedure: REMOVAL RIGHT TIBIOCALCANEAL NAIL;  Surgeon: Newt Minion, MD;  Location: [place];  Service:  Orthopedics;  Laterality: Right;  . HARDWARE REMOVAL Right 12/23/2018   Procedure: RIGHT ANKLE REMOVE HARDWARE, PLACE ANTIBIOTIC BEADS and placement of wound vac;  Surgeon: Newt Minion, MD;  Location: [place];  Service: Orthopedics;  Laterality: Right;  . I&D EXTREMITY Right 09/15/2014   Procedure: IRRIGATION AND DEBRIDEMENT Ankle;  Surgeon: Renette Butters, MD;  Location: [place];  Service: Orthopedics;  Laterality: Right;  . INGUINAL HERNIA REPAIR Bilateral   . LAMINECTOMY WITH POSTERIOR LATERAL ARTHRODESIS LEVEL 4 N/A 09/10/2018    Procedure: Posterior Lateral Fusion - Thoracic Eleven-Thoracic Twelve - Thoracic Twelve-Lumbar One - Lumbar One-Lumbar Two - Lumbar Two-Lumbar Three with instrumentaion and PLA;  Surgeon: Kary Kos, MD;  Location: [place];  Service: Neurosurgery;  Laterality: N/A;  Posterior Lateral Fusion - Thoracic Eleven-Thoracic Twelve - Thoracic Twelve-Lumbar One - Lumbar One-Lumbar Two - Lumbar Two-Lumbar Thre  . LUMBAR PERCUTANEOUS PEDICLE SCREW 1 LEVEL N/A 04/05/2017   Procedure: LUMBAR PERCUTANEOUS PEDICLE SCREW LUMBAR THREE-FOUR;  Surgeon: Kary Kos, MD;  Location: [place];  Service: Neurosurgery;  Laterality: N/A;  . ORIF CALCANEOUS FRACTURE Right 09/19/2014   Procedure: OPEN REDUCTION INTERNAL FIXATION (ORIF) CALCANEOUS FRACTURE;  Surgeon: Newt Minion, MD;  Location: [place];  Service: Orthopedics;  Laterality: Right;  . ORIF CALCANEOUS FRACTURE Left 09/19/2014   Procedure: OPEN REDUCTION INTERNAL FIXATION (ORIF) CALCANEOUS FRACTURE;  Surgeon: Newt Minion, MD;  Location: [place];  Service: Orthopedics;  Laterality: Left;    Family History  Problem Relation Age of Onset  . Diabetes Father   . Cancer Other    Social History:  reports that he quit smoking about 4 years ago. His smoking use included cigars. He started smoking about 42 years ago. He has a 10.00 pack-year smoking history. He has never used smokeless tobacco. He reports that he does not drink alcohol or use drugs.  Allergies:  Allergies  Allergen Reactions  . Codeine Nausea And Vomiting    Medications Prior to Admission  Medication Sig Dispense Refill  . bismuth subsalicylate (PEPTO BISMOL) 262 MG/15ML suspension Take 30 mLs by mouth every 6 (six) hours as needed for indigestion or diarrhea or loose stools.    . docusate sodium (COLACE) 100 MG capsule Take 100 mg by mouth at bedtime.    . gabapentin (NEURONTIN) 300 MG capsule Take 300-600 mg by mouth See admin instructions. Take 300mg  by mouth in the morning, 300 mg in the afternoon and  600 mg at bedtime    . ibuprofen (ADVIL) 200 MG tablet Take 800 mg by mouth every 6 (six) hours as needed for moderate pain.    Marland Kitchen omeprazole (PRILOSEC) 40 MG capsule Take 1 capsule (40 mg total) by mouth daily. 90 capsule 3  . ondansetron (ZOFRAN) 4 MG tablet Take 1 tablet (4 mg total) by mouth every 8 (eight) hours as needed for nausea or vomiting. 30 tablet 0  . oxyCODONE-acetaminophen (PERCOCET) 10-325 MG tablet Take 1 tablet by mouth every 6 (six) hours as needed for pain.     . polyethylene glycol powder (GLYCOLAX/MIRALAX) 17 GM/SCOOP powder Take 17 g by mouth daily.    Marland Kitchen senna-docusate (SENOKOT-S) 8.6-50 MG tablet Take 1 tablet by mouth every other day.    . simethicone (MYLICON) 0000000 MG chewable tablet Chew 125 mg by mouth every 6 (six) hours as needed for flatulence.    . tamsulosin (FLOMAX) 0.4 MG CAPS capsule Take 0.4 mg by mouth daily.     Marland Kitchen testosterone cypionate (DEPOTESTOSTERONE CYPIONATE)  200 MG/ML injection Inject 200 mg into the muscle every 14 (fourteen) days.    . vitamin C (ASCORBIC ACID) 500 MG tablet Take 500 mg by mouth daily.    Marland Kitchen zinc gluconate 50 MG tablet Take 50 mg by mouth daily.    . naproxen (NAPROSYN) 250 MG tablet Take 1 tablet (250 mg total) by mouth 2 (two) times daily with a meal. (Patient not taking: Reported on 05/29/2019) 60 tablet 0  . oxyCODONE-acetaminophen (PERCOCET/ROXICET) 5-325 MG tablet Take 1 tablet by mouth every 4 (four) hours as needed. (Patient not taking: Reported on 05/29/2019) 40 tablet 0    No results found for this or any previous visit (from the past 48 hour(s)). No results found.  ROS  Blood pressure (!) 162/90, pulse 69, temperature 98.2 F (36.8 C), temperature source Oral, resp. rate 17, SpO2 94 %. Physical Exam  Constitutional: He appears well-developed and well-nourished.  HENT:  Mouth/Throat: Oropharynx is clear and moist.  Eyes: Conjunctivae are normal. No scleral icterus.  Neck: No thyromegaly present.  Cardiovascular:  Normal rate, regular rhythm and normal heart sounds.  No murmur heard. Respiratory: Effort normal and breath sounds normal.  GI:  Abdomen is symmetrical.  On palpation it soft mild mild tenderness at epigastrium and LUQ and LLQ.  No organomegaly or masses.  Musculoskeletal:        General: No edema.  Lymphadenopathy:    He has no cervical adenopathy.  Neurological: He is alert.  Skin: Skin is warm and dry.     Assessment/Plan Epigastric pain with nausea and bloating. History of peptic ulcer disease. Diagnostic EGD and average risk screening colonoscopy.  Hildred Laser, MD 06/05/2019, 11:03 AM

## 2019-06-05 NOTE — Transfer of Care (Signed)
Immediate Anesthesia Transfer of Care Note  Patient: Bradley Espinoza  Procedure(s) Performed: ESOPHAGOGASTRODUODENOSCOPY (EGD) WITH PROPOFOL (N/A ) COLONOSCOPY WITH PROPOFOL (N/A ) BIOPSY POLYPECTOMY  Patient Location: PACU  Anesthesia Type:General  Level of Consciousness: awake  Airway & Oxygen Therapy: Patient Spontanous Breathing  Post-op Assessment: Report given to RN  Post vital signs: Reviewed and stable  Last Vitals:  Vitals Value Taken Time  BP 121/81 06/05/19 1157  Temp    Pulse 70 06/05/19 1201  Resp 11 06/05/19 1201  SpO2 97 % 06/05/19 1201  Vitals shown include unvalidated device data.  Last Pain:  Vitals:   06/05/19 1050  TempSrc: Oral  PainSc: 0-No pain      Patients Stated Pain Goal: 6 ([id number])  Complications: No apparent anesthesia complications

## 2019-06-05 NOTE — Anesthesia Postprocedure Evaluation (Signed)
Anesthesia Post Note  Patient: Bradley Espinoza  Procedure(s) Performed: ESOPHAGOGASTRODUODENOSCOPY (EGD) WITH PROPOFOL (N/A ) COLONOSCOPY WITH PROPOFOL (N/A ) BIOPSY POLYPECTOMY  Patient location during evaluation: PACU Anesthesia Type: General Level of consciousness: awake and alert and oriented Pain management: pain level controlled Vital Signs Assessment: post-procedure vital signs reviewed and stable Respiratory status: spontaneous breathing Cardiovascular status: blood pressure returned to baseline and stable Postop Assessment: no apparent nausea or vomiting Anesthetic complications: no     Last Vitals:  Vitals:   06/05/19 1050 06/05/19 1200  BP: (!) 162/90 121/81  Pulse: 69 68  Resp: 17 (!) 22  Temp: 36.8 C (P) 36.6 C  SpO2: 94%     Last Pain:  Vitals:   06/05/19 1050  TempSrc: Oral  PainSc: 0-No pain                 Dennette Faulconer

## 2019-06-08 ENCOUNTER — Encounter (HOSPITAL_COMMUNITY): Payer: Self-pay | Admitting: Internal Medicine

## 2019-06-08 DIAGNOSIS — M47816 Spondylosis without myelopathy or radiculopathy, lumbar region: Secondary | ICD-10-CM | POA: Diagnosis not present

## 2019-06-09 ENCOUNTER — Ambulatory Visit: Payer: PPO | Admitting: Orthopedic Surgery

## 2019-06-11 ENCOUNTER — Ambulatory Visit (INDEPENDENT_AMBULATORY_CARE_PROVIDER_SITE_OTHER): Payer: PPO

## 2019-06-11 ENCOUNTER — Encounter: Payer: Self-pay | Admitting: Orthopedic Surgery

## 2019-06-11 ENCOUNTER — Ambulatory Visit (INDEPENDENT_AMBULATORY_CARE_PROVIDER_SITE_OTHER): Payer: PPO | Admitting: Orthopedic Surgery

## 2019-06-11 VITALS — Ht 72.0 in | Wt 167.0 lb

## 2019-06-11 DIAGNOSIS — Z981 Arthrodesis status: Secondary | ICD-10-CM

## 2019-06-12 ENCOUNTER — Encounter: Payer: Self-pay | Admitting: Orthopedic Surgery

## 2019-06-12 NOTE — Progress Notes (Signed)
Office Visit Note   Patient: Bradley Espinoza           Date of Birth: Sep 14, 1956           MRN: [id number] Visit Date: 06/11/2019              Requested by: Celene Squibb, MD [place],  [place] [address] PCP: Celene Squibb, MD  Chief Complaint  Patient presents with  . Right Ankle - Follow-up    12/23/18 right ankle fusion       HPI: Patient is a 63 year old gentleman status post open calcaneus fracture subsequent tibial calcaneal fusion with infection that required removal of the hardware patient most recently has been in a short leg weightbearing cast which she states was less painful but patient states he does not want to wear a cast again at this time.  Patient states that he has had no relief with the shoe in the braces.  He also states he has pain with ambulating with a fracture boot.  Patient states he has chronic back pain he had epidural steroid injections 2 weeks ago and states that he is going to return for possible nerve ablation.  Patient also complains of left heel pain and sinus Tarsi pain on the left.  Patient states his pain is primarily in the right across the talonavicular joint and plantar heel.  Assessment & Plan: Visit Diagnoses:  1. H/O ankle fusion     Plan: Patient states he will try to resume using the shoe wear and brace.  Discussed that he does have a partial fibrous union and recommended vitamin D3 2000 international units and 1000 mg of calcium a day.  Discussed that the pain across the talonavicular joint as well as the plantar fascia is not something that would be amenable to surgery.  Follow-Up Instructions: Return in about 4 weeks (around 07/09/2019).   Ortho Exam  Patient is alert, oriented, no adenopathy, well-dressed, normal affect, normal respiratory effort. Examination patient's foot is plantigrade he does have an antalgic gait the leg is short.  Reviewed his radiographs that shows approximately two thirds of the tibial talar  joint is fused.  On palpation he has tenderness to palpation over the talonavicular joint and has pain to palpation on the plantar aspect of the heel.  On the left foot he has no pain with inversion or eversion.  Imaging: No results found. No images are attached to the encounter.  Labs: Lab Results  Component Value Date   HGBA1C 5.1 06/26/2016   ESRSEDRATE 9 12/08/2018   REPTSTATUS 12/28/2018 FINAL 12/23/2018   GRAMSTAIN  12/23/2018    FEW WBC PRESENT,BOTH PMN AND MONONUCLEAR RARE GRAM POSITIVE COCCI Performed at [facility], [address] [place], [place] [address]    CULT  12/23/2018    FEW STAPHYLOCOCCUS AUREUS NO ANAEROBES ISOLATED; CULTURE IN PROGRESS FOR 5 DAYS    LABORGA STAPHYLOCOCCUS AUREUS 12/23/2018     Lab Results  Component Value Date   ALBUMIN 4.3 08/12/2015   ALBUMIN 4.1 05/06/2015   ALBUMIN 4.0 09/15/2014    No results found for: MG No results found for: VD25OH  No results found for: PREALBUMIN CBC EXTENDED Latest Ref Rng & Units 12/19/2018 11/26/2018 09/01/2018  WBC 4.0 - 10.5 K/uL 6.9 5.3 6.8  RBC 4.22 - 5.81 MIL/uL 4.03(L) 4.67 4.65  HGB 13.0 - 17.0 g/dL 11.8(L) 14.1 14.9  HCT 39.0 - 52.0 % 36.5(L) 44.8 45.1  PLT 150 - 400 K/uL 214 198 267  NEUTROABS 1.7 - 7.7 K/uL - - -  LYMPHSABS 0.7 - 4.0 K/uL - - -     Body mass index is 22.65 kg/m.  Orders:  Orders Placed This Encounter  Procedures  . XR Ankle Complete Right   No orders of the defined types were placed in this encounter.    Procedures: No procedures performed  Clinical Data: No additional findings.  ROS:  All other systems negative, except as noted in the HPI. Review of Systems  Objective: Vital Signs: Ht 6' (1.829 m)   Wt 167 lb (75.8 kg)   BMI 22.65 kg/m   Specialty Comments:  No specialty comments available.  PMFS History: Patient Active Problem List   Diagnosis Date Noted  . Nausea without vomiting 05/13/2019  . Abdominal bloating 05/13/2019  . Wound  infection following procedure 12/19/2018  . Malunion of joint fusion ([place]) 11/26/2018  . 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[facility])   . Status post lumbar spinal fusion 09/10/2018  . S/P ankle fusion 11/27/2017  . Nonunion of subtalar arthrodesis   . Avascular necrosis of talus, right ([place])   . Post-traumatic osteoarthritis, left ankle and foot 10/22/2017  . DDD (degenerative disc disease), lumbar 04/05/2017  . Fracture of L2 vertebra ([place]) 01/12/2015  . Acute osteomyelitis of calcaneum ([place]) 10/09/2014  . Dysuria 10/05/2014  . Cellulitis 10/05/2014  . Fall from ladder 09/21/2014  . L2 vertebral fracture ([place]) 09/21/2014  . Bilateral calcaneal fractures 09/21/2014  . Chronic pain 09/21/2014  . Acute blood loss anemia 09/21/2014  . Open fracture of both calcanei 09/15/2014   Past Medical History:  Diagnosis Date  . Anxiety   . Arthritis    low back pain, lumbar radiculopathy  . Depression   . Fracture    B/L ankles  . GERD (gastroesophageal reflux disease)   . Headache(784.0)    allergy related   . History of kidney stones   . History of stomach ulcers   . Retained orthopedic hardware    failed retained hardware right foot  . Wears glasses     Family History  Problem Relation Age of Onset  . Diabetes Father   . Cancer Other     Past Surgical History:  Procedure Laterality Date  . ANKLE FUSION Right 05/11/2015   Procedure: Right Posterior Arthroscopic Subtalar Arthrodesis;  Surgeon: Newt Minion, MD;  Location: [place];  Service: Orthopedics;  Laterality: Right;  . ANKLE FUSION Right 11/27/2017   Procedure: RIGHT TIBIOCALCANEAL FUSION;  Surgeon: Newt Minion, MD;  Location: [place];  Service: Orthopedics;  Laterality: Right;  . ANKLE FUSION Right 11/26/2018   Procedure: RIGHT ANTERIOR ANKLE FUSION, APPLY VAC;  Surgeon: Newt Minion, MD;  Location: [place];  Service: Orthopedics;  Laterality: Right;  . ANTERIOR LAT LUMBAR FUSION N/A 04/05/2017   Procedure: Extreme Lateral Interbody Fusion -  Lumbar three-lumbar four ,exploration of fusion Posterior augmentation with globus addition Removal hardware Lumbar one-three. Lumbar four-sacral one,  Removal internal bone growth stimulator;  Surgeon: Kary Kos, MD;  Location: [place];  Service: Neurosurgery;  Laterality: N/A;  . APPLICATION OF WOUND VAC  12/23/2018   Procedure: Application Of Wound Vac;  Surgeon: Newt Minion, MD;  Location: [place];  Service: Orthopedics;;  . BACK SURGERY  2004   x 2  . BIOPSY  06/05/2019   Procedure: BIOPSY;  Surgeon: Rogene Houston, MD;  [facility];  Service: Endoscopy;;  gastric biopsy  .  CERVICAL SPINE SURGERY  2008  . COLONOSCOPY WITH PROPOFOL N/A 06/05/2019   Procedure: COLONOSCOPY WITH PROPOFOL;  Surgeon: Rogene Houston, MD;  [facility];  Service: Endoscopy;  Laterality: N/A;  . ESOPHAGOGASTRODUODENOSCOPY    . ESOPHAGOGASTRODUODENOSCOPY (EGD) WITH PROPOFOL N/A 06/05/2019   Procedure: ESOPHAGOGASTRODUODENOSCOPY (EGD) WITH PROPOFOL;  Surgeon: Rogene Houston, MD;  [facility];  Service: Endoscopy;  Laterality: N/A;  1220pm  . HARDWARE REMOVAL Right 10/09/2014   Procedure: Removal Deep Hardware, Irrigation and Debridement Calcaneus, Place Antibiotic Beads and Wound VAC ;  Surgeon: Newt Minion, MD;  Location: [place];  Service: Orthopedics;  Laterality: Right;  . HARDWARE REMOVAL Right 08/12/2015   Procedure: Removal Deep Hardware Right Foot;  Surgeon: Newt Minion, MD;  Location: [place];  Service: Orthopedics;  Laterality: Right;  . HARDWARE REMOVAL Right 11/26/2018   Procedure: REMOVAL RIGHT TIBIOCALCANEAL NAIL;  Surgeon: Newt Minion, MD;  Location: [place];  Service: Orthopedics;  Laterality: Right;  . HARDWARE REMOVAL Right 12/23/2018   Procedure: RIGHT ANKLE REMOVE HARDWARE, PLACE ANTIBIOTIC BEADS and placement of wound vac;  Surgeon: Newt Minion, MD;  Location: [place];  Service: Orthopedics;  Laterality: Right;  . I&D EXTREMITY Right 09/15/2014   Procedure:  IRRIGATION AND DEBRIDEMENT Ankle;  Surgeon: Renette Butters, MD;  Location: [place];  Service: Orthopedics;  Laterality: Right;  . INGUINAL HERNIA REPAIR Bilateral   . LAMINECTOMY WITH POSTERIOR LATERAL ARTHRODESIS LEVEL 4 N/A 09/10/2018   Procedure: Posterior Lateral Fusion - Thoracic Eleven-Thoracic Twelve - Thoracic Twelve-Lumbar One - Lumbar One-Lumbar Two - Lumbar Two-Lumbar Three with instrumentaion and PLA;  Surgeon: Kary Kos, MD;  Location: [place];  Service: Neurosurgery;  Laterality: N/A;  Posterior Lateral Fusion - Thoracic Eleven-Thoracic Twelve - Thoracic Twelve-Lumbar One - Lumbar One-Lumbar Two - Lumbar Two-Lumbar Thre  . LUMBAR PERCUTANEOUS PEDICLE SCREW 1 LEVEL N/A 04/05/2017   Procedure: LUMBAR PERCUTANEOUS PEDICLE SCREW LUMBAR THREE-FOUR;  Surgeon: Kary Kos, MD;  Location: [place];  Service: Neurosurgery;  Laterality: N/A;  . ORIF CALCANEOUS FRACTURE Right 09/19/2014   Procedure: OPEN REDUCTION INTERNAL FIXATION (ORIF) CALCANEOUS FRACTURE;  Surgeon: Newt Minion, MD;  Location: [place];  Service: Orthopedics;  Laterality: Right;  . ORIF CALCANEOUS FRACTURE Left 09/19/2014   Procedure: OPEN REDUCTION INTERNAL FIXATION (ORIF) CALCANEOUS FRACTURE;  Surgeon: Newt Minion, MD;  Location: [place];  Service: Orthopedics;  Laterality: Left;  . POLYPECTOMY  06/05/2019   Procedure: POLYPECTOMY;  Surgeon: Rogene Houston, MD;  [facility];  Service: Endoscopy;;  cecal polyp biopsy forcep, ascending polyp cold snare   Social History   Occupational History    Comment: disabled  Tobacco Use  . Smoking status: Former Smoker    Packs/day: 1.00    Years: 10.00    Pack years: 10.00    Types: Cigars    Start date: 10/08/1976    Quit date: 09/07/2014    Years since quitting: 4.7  . Smokeless tobacco: Never Used  . Tobacco comment: quit smoking cegar 09/10/2018  Substance and Sexual Activity  . Alcohol use: No    Comment: 06/26/16 quit 1998  . Drug use: No  . Sexual activity: Yes     Birth control/protection: None

## 2019-06-24 DIAGNOSIS — Z981 Arthrodesis status: Secondary | ICD-10-CM | POA: Diagnosis not present

## 2019-06-24 DIAGNOSIS — M47816 Spondylosis without myelopathy or radiculopathy, lumbar region: Secondary | ICD-10-CM | POA: Diagnosis not present

## 2019-06-24 DIAGNOSIS — R030 Elevated blood-pressure reading, without diagnosis of hypertension: Secondary | ICD-10-CM | POA: Diagnosis not present

## 2019-06-30 ENCOUNTER — Ambulatory Visit (INDEPENDENT_AMBULATORY_CARE_PROVIDER_SITE_OTHER): Payer: PPO | Admitting: General Surgery

## 2019-06-30 ENCOUNTER — Encounter: Payer: Self-pay | Admitting: General Surgery

## 2019-06-30 ENCOUNTER — Other Ambulatory Visit: Payer: Self-pay

## 2019-06-30 VITALS — BP 140/75 | HR 78 | Temp 97.7°F | Resp 16 | Ht 72.0 in | Wt 163.0 lb

## 2019-06-30 DIAGNOSIS — K8020 Calculus of gallbladder without cholecystitis without obstruction: Secondary | ICD-10-CM | POA: Diagnosis not present

## 2019-06-30 DIAGNOSIS — K760 Fatty (change of) liver, not elsewhere classified: Secondary | ICD-10-CM | POA: Diagnosis not present

## 2019-06-30 NOTE — Progress Notes (Signed)
[facility] History and Physical  Reason for Referral: Gallbladder Referring Physician:  Dr. Laural Golden   Chief Complaint    Abdominal Pain      Bradley Espinoza is a 63 y.o. male.  HPI: Mr. Bradley Espinoza si a 63 yo with NAFLD who has complaints of chronic epigastric pain with associated nausea and bloating for the past few months. He has a history of past peptic ulcers and takes a lot ibuprofen, so he was worked up by Dr. Laural Golden and underwent an EGD and colonoscopy. The EGD was positive for gastritis but no ulcer, and the colonoscopy demonstrated diverticulosis and polyps.   The patient says he continues to have abdominal pain which is worse after eating, and that it makes him feel very full and bloated. He says that this has continued despite trying different antiacid medications. He says that he also is in a pain contract for chronic back pain, and has seen Dr. Sharol Given recently for his right foot and ankle.     Past Medical History:  Diagnosis Date  . Anxiety   . Arthritis    low back pain, lumbar radiculopathy  . Depression   . Fracture    B/L ankles  . GERD (gastroesophageal reflux disease)   . Headache(784.0)    allergy related   . History of kidney stones   . History of stomach ulcers   . Retained orthopedic hardware    failed retained hardware right foot  . Wears glasses     Past Surgical History:  Procedure Laterality Date  . ANKLE FUSION Right 05/11/2015   Procedure: Right Posterior Arthroscopic Subtalar Arthrodesis;  Surgeon: Newt Minion, MD;  Location: [place];  Service: Orthopedics;  Laterality: Right;  . ANKLE FUSION Right 11/27/2017   Procedure: RIGHT TIBIOCALCANEAL FUSION;  Surgeon: Newt Minion, MD;  Location: [place];  Service: Orthopedics;  Laterality: Right;  . ANKLE FUSION Right 11/26/2018   Procedure: RIGHT ANTERIOR ANKLE FUSION, APPLY VAC;  Surgeon: Newt Minion, MD;  Location: [place];  Service: Orthopedics;  Laterality: Right;  . ANTERIOR LAT  LUMBAR FUSION N/A 04/05/2017   Procedure: Extreme Lateral Interbody Fusion - Lumbar three-lumbar four ,exploration of fusion Posterior augmentation with globus addition Removal hardware Lumbar one-three. Lumbar four-sacral one,  Removal internal bone growth stimulator;  Surgeon: Kary Kos, MD;  Location: [place];  Service: Neurosurgery;  Laterality: N/A;  . APPLICATION OF WOUND VAC  12/23/2018   Procedure: Application Of Wound Vac;  Surgeon: Newt Minion, MD;  Location: [facility];  Service: Orthopedics;;  . BACK SURGERY  2004   x 2  . BIOPSY  06/05/2019   Procedure: BIOPSY;  Surgeon: Rogene Houston, MD;  [facility];  Service: Endoscopy;;  gastric biopsy  . CERVICAL SPINE SURGERY  2008  . COLONOSCOPY WITH PROPOFOL N/A 06/05/2019   Procedure: COLONOSCOPY WITH PROPOFOL;  Surgeon: Rogene Houston, MD;  [facility];  Service: Endoscopy;  Laterality: N/A;  . ESOPHAGOGASTRODUODENOSCOPY    . ESOPHAGOGASTRODUODENOSCOPY (EGD) WITH PROPOFOL N/A 06/05/2019   Procedure: ESOPHAGOGASTRODUODENOSCOPY (EGD) WITH PROPOFOL;  Surgeon: Rogene Houston, MD;  [facility];  Service: Endoscopy;  Laterality: N/A;  1220pm  . HARDWARE REMOVAL Right 10/09/2014   Procedure: Removal Deep Hardware, Irrigation and Debridement Calcaneus, Place Antibiotic Beads and Wound VAC ;  Surgeon: Newt Minion, MD;  Location: [place];  Service: Orthopedics;  Laterality: Right;  . HARDWARE REMOVAL Right 08/12/2015   Procedure: Removal  Deep Hardware Right Foot;  Surgeon: Newt Minion, MD;  Location: [place];  Service: Orthopedics;  Laterality: Right;  . HARDWARE REMOVAL Right 11/26/2018   Procedure: REMOVAL RIGHT TIBIOCALCANEAL NAIL;  Surgeon: Newt Minion, MD;  Location: [place];  Service: Orthopedics;  Laterality: Right;  . HARDWARE REMOVAL Right 12/23/2018   Procedure: RIGHT ANKLE REMOVE HARDWARE, PLACE ANTIBIOTIC BEADS and placement of wound vac;  Surgeon: Newt Minion, MD;  Location: [place];  Service:  Orthopedics;  Laterality: Right;  . I&D EXTREMITY Right 09/15/2014   Procedure: IRRIGATION AND DEBRIDEMENT Ankle;  Surgeon: Renette Butters, MD;  Location: [place];  Service: Orthopedics;  Laterality: Right;  . INGUINAL HERNIA REPAIR Bilateral   . LAMINECTOMY WITH POSTERIOR LATERAL ARTHRODESIS LEVEL 4 N/A 09/10/2018   Procedure: Posterior Lateral Fusion - Thoracic Eleven-Thoracic Twelve - Thoracic Twelve-Lumbar One - Lumbar One-Lumbar Two - Lumbar Two-Lumbar Three with instrumentaion and PLA;  Surgeon: Kary Kos, MD;  Location: [place];  Service: Neurosurgery;  Laterality: N/A;  Posterior Lateral Fusion - Thoracic Eleven-Thoracic Twelve - Thoracic Twelve-Lumbar One - Lumbar One-Lumbar Two - Lumbar Two-Lumbar Thre  . LUMBAR PERCUTANEOUS PEDICLE SCREW 1 LEVEL N/A 04/05/2017   Procedure: LUMBAR PERCUTANEOUS PEDICLE SCREW LUMBAR THREE-FOUR;  Surgeon: Kary Kos, MD;  Location: [place];  Service: Neurosurgery;  Laterality: N/A;  . ORIF CALCANEOUS FRACTURE Right 09/19/2014   Procedure: OPEN REDUCTION INTERNAL FIXATION (ORIF) CALCANEOUS FRACTURE;  Surgeon: Newt Minion, MD;  Location: [place];  Service: Orthopedics;  Laterality: Right;  . ORIF CALCANEOUS FRACTURE Left 09/19/2014   Procedure: OPEN REDUCTION INTERNAL FIXATION (ORIF) CALCANEOUS FRACTURE;  Surgeon: Newt Minion, MD;  Location: [place];  Service: Orthopedics;  Laterality: Left;  . POLYPECTOMY  06/05/2019   Procedure: POLYPECTOMY;  Surgeon: Rogene Houston, MD;  [facility];  Service: Endoscopy;;  cecal polyp biopsy forcep, ascending polyp cold snare    Family History  Problem Relation Age of Onset  . Diabetes Father   . Cancer Other     Social History   Tobacco Use  . Smoking status: Former Smoker    Packs/day: 1.00    Years: 10.00    Pack years: 10.00    Types: Cigars    Start date: 10/08/1976    Quit date: 09/07/2014    Years since quitting: 4.8  . Smokeless tobacco: Never Used  . Tobacco comment: quit smoking cegar 09/10/2018   Substance Use Topics  . Alcohol use: No    Comment: 06/26/16 quit 1998  . Drug use: No    Medications: I have reviewed the patient's current medications. Allergies as of 06/30/2019      Reactions   Codeine Nausea And Vomiting      Medication List       Accurate as of June 30, 2019 11:18 AM. If you have any questions, ask your nurse or doctor.        bismuth subsalicylate [id number] suspension Commonly known as: PEPTO BISMOL Take 30 mLs by mouth every 6 (six) hours as needed for indigestion or diarrhea or loose stools.   docusate sodium 100 MG capsule Commonly known as: COLACE Take 100 mg by mouth at bedtime.   gabapentin 300 MG capsule Commonly known as: NEURONTIN Take 300-600 mg by mouth See admin instructions. Take 300mg  by mouth in the morning, 300 mg in the afternoon and 600 mg at bedtime   ibuprofen 200 MG tablet Commonly known as: ADVIL Take 800 mg by mouth  every 6 (six) hours as needed for moderate pain.   omeprazole 40 MG capsule Commonly known as: PRILOSEC Take 1 capsule (40 mg total) by mouth daily.   ondansetron 4 MG tablet Commonly known as: ZOFRAN Take 1 tablet (4 mg total) by mouth every 8 (eight) hours as needed for nausea or vomiting.   oxyCODONE-acetaminophen 10-325 MG tablet Commonly known as: PERCOCET Take 1 tablet by mouth every 6 (six) hours as needed for pain.   polyethylene glycol powder 17 GM/SCOOP powder Commonly known as: GLYCOLAX/MIRALAX Take 17 g by mouth daily.   senna-docusate 8.6-50 MG tablet Commonly known as: Senokot-S Take 1 tablet by mouth every other day.   simethicone 125 MG chewable tablet Commonly known as: MYLICON Chew 0000000 mg by mouth every 6 (six) hours as needed for flatulence.   tamsulosin 0.4 MG Caps capsule Commonly known as: FLOMAX Take 0.4 mg by mouth daily.   testosterone cypionate 200 MG/ML injection Commonly known as: DEPOTESTOSTERONE CYPIONATE Inject 200 mg into the muscle every 14 (fourteen)  days.   vitamin C 500 MG tablet Commonly known as: ASCORBIC ACID Take 500 mg by mouth daily.   zinc gluconate 50 MG tablet Take 50 mg by mouth daily.        ROS:  A comprehensive review of systems was negative except for: Gastrointestinal: positive for abdominal pain, constipation, nausea, reflux symptoms and bloating Musculoskeletal: positive for back pain, neck pain and right foot pain  Blood pressure 140/75, pulse 78, temperature 97.7 F (36.5 C), temperature source Tympanic, resp. rate 16, height 6' (1.829 m), weight 163 lb (73.9 kg), SpO2 95 %. Physical Exam Vitals signs reviewed.  Constitutional:      Appearance: He is well-developed.  HENT:     Head: Normocephalic.  Eyes:     Extraocular Movements: Extraocular movements intact.  Cardiovascular:     Rate and Rhythm: Normal rate and regular rhythm.  Pulmonary:     Effort: Pulmonary effort is normal.     Breath sounds: Normal breath sounds.  Abdominal:     General: There is no distension.     Palpations: Abdomen is soft.     Tenderness: There is abdominal tenderness in the right upper quadrant and epigastric area. There is no guarding or rebound.  Skin:    General: Skin is warm and dry.  Neurological:     General: No focal deficit present.     Mental Status: He is oriented to person, place, and time.  Psychiatric:        Mood and Affect: Mood normal.        Behavior: Behavior normal.     Results: [place] 12/2018- personally reviewed gallstones without thickening  IMPRESSION: ULTRASOUND ABDOMEN:  Cholelithiasis. No sonographic signs of cholecystitis or biliary obstruction.  Hepatic steatosis.  Mild splenomegaly.  ULTRASOUND HEPATIC ELASTOGRAPHY:  Median hepatic shear wave velocity is calculated at 1.20 m/sec.  Corresponding Metavir fibrosis score is F2 + some F3.  Risk of fibrosis is Moderate.  Follow-up: Additional testing appropriate   Assessment & Plan:  Bradley Espinoza is a 63 y.o.  male with non alcoholic fatty liver disease and what sounds like symptomatic cholelithiasis with some chronic abdominal pain after eating and nausea/bloating. He has continued to have symptoms and has tried to alter his anti-acids.  He has undergone EGD with Dr. Laural Golden.   -PLAN: I counseled the patient about the indication, risks and benefits of laparoscopic cholecystectomy.  He understands there is a very small chance  for bleeding, infection, injury to normal structures (including common bile duct), conversion to open surgery, persistent symptoms, evolution of postcholecystectomy diarrhea, need for secondary interventions, anesthesia reaction, cardiopulmonary issues and other risks not specifically detailed here. I described the expected recovery, the plan for follow-up and the restrictions during the recovery phase.  All questions were answered.  -In addition to the laparoscopic cholecystectomy we discussed liver biopsy to give Dr. Laural Golden more information about the liver. -Discussed the increased risk of bleeding from the biopsy site.  -Discussed COVID testing prior to any surgery and need for isolation after testing.  -Discussed that with the pain contract, he will likely just stay on his Percocet he takes every 6 hours for pain control   All questions were answered to the satisfaction of the patient.    Virl Cagey 06/30/2019, 11:18 AM

## 2019-06-30 NOTE — Patient Instructions (Signed)
Cholelithiasis  Cholelithiasis is a form of gallbladder disease in which gallstones form in the gallbladder. The gallbladder is an organ that stores bile. Bile is made in the liver, and it helps to digest fats. Gallstones begin as small crystals and slowly grow into stones. They may cause no symptoms until the gallbladder tightens (contracts) and a gallstone is blocking the duct (gallbladder attack), which can cause pain. Cholelithiasis is also referred to as gallstones. There are two main types of gallstones:  Cholesterol stones. These are made of hardened cholesterol and are usually yellow-green in color. They are the most common type of gallstone. Cholesterol is a white, waxy, fat-like substance that is made in the liver.  Pigment stones. These are dark in color and are made of a red-yellow substance that forms when hemoglobin from red blood cells breaks down (bilirubin). What are the causes? This condition may be caused by an imbalance in the substances that bile is made of. This can happen if the bile:  Has too much bilirubin.  Has too much cholesterol.  Does not have enough bile salts. These salts help the body absorb and digest fats. In some cases, this condition can also be caused by the gallbladder not emptying completely or often enough. What increases the risk? The following factors may make you more likely to develop this condition:  Being male.  Having multiple pregnancies. Health care providers sometimes advise removing diseased gallbladders before future pregnancies.  Eating a diet that is heavy in fried foods, fat, and refined carbohydrates, like white bread and white rice.  Being obese.  Being older than age 22.  Prolonged use of medicines that contain male hormones (estrogen).  Having diabetes mellitus.  Rapidly losing weight.  Having a family history of gallstones.  Being of [place] or [place] descent.  Having an intestinal disease such as Crohn  disease.  Having metabolic syndrome.  Having cirrhosis.  Having severe types of anemia such as sickle cell anemia. What are the signs or symptoms? In most cases, there are no symptoms. These are known as silent gallstones. If a gallstone blocks the bile ducts, it can cause a gallbladder attack. The main symptom of a gallbladder attack is sudden pain in the upper right abdomen. The pain usually comes at night or after eating a large meal. The pain can last for one or several hours and can spread to the right shoulder or chest. If the bile duct is blocked for more than a few hours, it can cause infection or inflammation of the gallbladder, liver, or pancreas, which may cause:  Nausea.  Vomiting.  Abdominal pain that lasts for 5 hours or more.  Fever or chills.  Yellowing of the skin or the whites of the eyes (jaundice).  Dark urine.  Light-colored stools. How is this diagnosed? This condition may be diagnosed based on:  A physical exam.  Your medical history.  An ultrasound of your gallbladder.  CT scan.  MRI.  Blood tests to check for signs of infection or inflammation.  A scan of your gallbladder and bile ducts (biliary system) using nonharmful radioactive material and special cameras that can see the radioactive material (cholescintigram). This test checks to see how your gallbladder contracts and whether bile ducts are blocked.  Inserting a small tube with a camera on the end (endoscope) through your mouth to inspect bile ducts and check for blockages (endoscopic retrograde cholangiopancreatogram). How is this treated? Treatment for gallstones depends on the severity of the condition.  Silent gallstones do not need treatment. If the gallstones cause a gallbladder attack or other symptoms, treatment may be required. Options for treatment include:  Surgery to remove the gallbladder (cholecystectomy). This is the most common treatment.  Medicines to dissolve gallstones.  These are most effective at treating small gallstones. You may need to take medicines for up to 6-12 months.  Shock wave treatment (extracorporeal biliary lithotripsy). In this treatment, an ultrasound machine sends shock waves to the gallbladder to break gallstones into smaller pieces. These pieces can then be passed into the intestines or be dissolved by medicine. This is rarely used.  Removing gallstones through endoscopic retrograde cholangiopancreatogram. A small basket can be attached to the endoscope and used to capture and remove gallstones. Follow these instructions at home:  Take over-the-counter and prescription medicines only as told by your health care provider.  Maintain a healthy weight and follow a healthy diet. This includes: ? Reducing fatty foods, such as fried food. ? Reducing refined carbohydrates, like white bread and white rice. ? Increasing fiber. Aim for foods like almonds, fruit, and beans.  Keep all follow-up visits as told by your health care provider. This is important. Contact a health care provider if:  You think you have had a gallbladder attack.  You have been diagnosed with silent gallstones and you develop abdominal pain or indigestion. Get help right away if:  You have pain from a gallbladder attack that lasts for more than 2 hours.  You have abdominal pain that lasts for more than 5 hours.  You have a fever or chills.  You have persistent nausea and vomiting.  You develop jaundice.  You have dark urine or light-colored stools. Summary  Cholelithiasis (also called gallstones) is a form of gallbladder disease in which gallstones form in the gallbladder.  This condition is caused by an imbalance in the substances that make up bile. This can happen if the bile has too much cholesterol, too much bilirubin, or not enough bile salts.  You are more likely to develop this condition if you are male, pregnant, using medicines with estrogen, obese,  older than age 2, or have a family history of gallstones. You may also develop gallstones if you have diabetes, an intestinal disease, cirrhosis, or metabolic syndrome.  Treatment for gallstones depends on the severity of the condition. Silent gallstones do not need treatment.  If gallstones cause a gallbladder attack or other symptoms, treatment may be needed. The most common treatment is surgery to remove the gallbladder. This information is not intended to replace advice given to you by your health care provider. Make sure you discuss any questions you have with your health care provider. Document Released: 09/20/2005 Document Revised: 09/06/2017 Document Reviewed: 06/10/2016 Elsevier Patient Education  2020 [place].   Laparoscopic Cholecystectomy Laparoscopic cholecystectomy is surgery to remove the gallbladder. The gallbladder is a pear-shaped organ that lies beneath the liver on the right side of the body. The gallbladder stores bile, which is a fluid that helps the body to digest fats. Cholecystectomy is often done for inflammation of the gallbladder (cholecystitis). This condition is usually caused by a buildup of gallstones (cholelithiasis) in the gallbladder. Gallstones can block the flow of bile, which can result in inflammation and pain. In severe cases, emergency surgery may be required. This procedure is done though small incisions in your abdomen (laparoscopic surgery). A thin scope with a camera (laparoscope) is inserted through one incision. Thin surgical instruments are inserted through the other incisions.  In some cases, a laparoscopic procedure may be turned into a type of surgery that is done through a larger incision (open surgery). Tell a health care provider about:  Any allergies you have.  All medicines you are taking, including vitamins, herbs, eye drops, creams, and over-the-counter medicines.  Any problems you or family members have had with anesthetic  medicines.  Any blood disorders you have.  Any surgeries you have had.  Any medical conditions you have.  Whether you are pregnant or may be pregnant. What are the risks? Generally, this is a safe procedure. However, problems may occur, including:  Infection.  Bleeding.  Allergic reactions to medicines.  Damage to other structures or organs.  A stone remaining in the common bile duct. The common bile duct carries bile from the gallbladder into the small intestine.  A bile leak from the cyst duct that is clipped when your gallbladder is removed. Medicines  Ask your health care provider about: ? Changing or stopping your regular medicines. This is especially important if you are taking diabetes medicines or blood thinners. ? Taking medicines such as aspirin and ibuprofen. These medicines can thin your blood. Do not take these medicines before your procedure if your health care provider instructs you not to.  You may be given antibiotic medicine to help prevent infection. General instructions  Let your health care provider know if you develop a cold or an infection before surgery.  Plan to have someone take you home from the hospital or clinic.  Ask your health care provider how your surgical site will be marked or identified. What happens during the procedure?   To reduce your risk of infection: ? Your health care team will wash or sanitize their hands. ? Your skin will be washed with soap. ? Hair may be removed from the surgical area.  An IV tube may be inserted into one of your veins.  You will be given one or more of the following: ? A medicine to help you relax (sedative). ? A medicine to make you fall asleep (general anesthetic).  A breathing tube will be placed in your mouth.  Your surgeon will make several small cuts (incisions) in your abdomen.  The laparoscope will be inserted through one of the small incisions. The camera on the laparoscope will send  images to a TV screen (monitor) in the operating room. This lets your surgeon see inside your abdomen.  Air-like gas will be pumped into your abdomen. This will expand your abdomen to give the surgeon more room to perform the surgery.  Other tools that are needed for the procedure will be inserted through the other incisions. The gallbladder will be removed through one of the incisions.  Your common bile duct may be examined. If stones are found in the common bile duct, they may be removed.  After your gallbladder has been removed, the incisions will be closed with stitches (sutures), staples, or skin glue.  Your incisions may be covered with a bandage (dressing). The procedure may vary among health care providers and hospitals. What happens after the procedure?  Your blood pressure, heart rate, breathing rate, and blood oxygen level will be monitored until the medicines you were given have worn off.  You will be given medicines as needed to control your pain.  Do not drive for 24 hours if you were given a sedative. This information is not intended to replace advice given to you by your health care provider. Make sure you  discuss any questions you have with your health care provider. Document Released: 09/24/2005 Document Revised: 09/06/2017 Document Reviewed: 03/12/2016 Elsevier Patient Education  2020 [employer].   Liver Biopsy  The liver is a large organ in the upper right side of the abdomen. A liver biopsy is a procedure in which a tissue sample is taken from the liver and examined under a microscope. There are three types of liver biopsies:  Percutaneous. A needle is used to remove a sample through an incision in your abdomen.  Laparoscopic. Several incisions are made in the abdomen. A sample is removed with the help of a tiny camera.  Transjugular. An incision is made in your neck in the area of the jugular vein. A sample is removed through a small flexible tube that is  passed down the blood vessel and into your liver. Tell a health care provider about:  Any allergies you have.  All medicines you are taking, including vitamins, herbs, eye drops, creams, and over-the-counter medicines.  Any problems you or family members have had with anesthetic medicines.  Any blood disorders you have.  Any surgeries you have had.  Any medical conditions you have.  Whether you are pregnant or may be pregnant. What are the risks? Generally, this is a safe procedure. However, problems can occur and include:  Bleeding.  Infection.  Bruising.  Pain.  Injury to nearby organs or tissues, such as nerves, gallbladder, liver, or lungs. What happens before the procedure? Eating and drinking restrictions  You may be asked not to drink or eat for 6-8 hours before the liver biopsy. You may be allowed to eat a light breakfast. Talk to your health care provider about when you should stop eating and drinking. Medicines Ask your health care provider about:  Changing or stopping your regular medicines. This is especially important if you are taking diabetes medicines or blood thinners.  Taking medicines such as aspirin and ibuprofen. These medicines can thin your blood. Do not take these medicines unless your health care provider tells you to take them.  Taking over-the-counter medicines, vitamins, herbs, and supplements. General instructions  Do not use any products that contain nicotine or tobacco, such as cigarettes and e-cigarettes. If you need help quitting, ask your health care provider.  Plan to have someone take you home from the hospital or clinic.  Plan to have a responsible adult care for you for at least 24 hours after you leave the hospital or clinic. This is important.  You may have blood or urine tests.  Ask your health care provider what steps will be taken to prevent infection. These may include: ? Removing hair at the surgery site. ? Washing skin  with a germ-killing soap. ? Taking antibiotic medicine. What happens during the procedure?  An IV will be inserted into one of your veins. ? You will be given one or more of the following: ? A medicine to help you relax (sedative). ? A medicine to numb the area (local anesthetic). ? A medicine to make you fall asleep (general anesthetic).  Your health care provider will use one of the following procedures to remove samples from your liver. These procedures may vary among health care providers and hospitals. Percutaneous liver biopsy  You will lie on your back, with your right hand over your head.  A health care provider will locate your liver by tapping and pressing on the right side of your abdomen, or by using an ultrasound or CT scan.  A local anesthetic will be used to numb an area at the bottom of your last right rib.  A small incision will be made in the numbed area.  A biopsy needle will be inserted into the incision.  Several samples of liver tissue will be taken. You will be asked to hold your breath as each sample is taken.  The incision will be closed with stitches (sutures).  A bandage (dressing) may be placed over the incision. Laparoscopic liver biopsy  You will lie on your back.  Several small incisions will be made in your abdomen.  Your health care provider will pass a tiny camera through one incision. The camera will allow the liver to be viewed on a TV monitor in the operating room.  Tools will be passed through the other incision or incisions.  Samples of the liver will be removed using the tools.  The incisions will be closed with stitches (sutures).  A bandage (dressing) may be placed over the incisions. Transjugular liver biopsy  You will lie on your back on an X-ray table, with your head turned to your left.  An area on your neck, just over your jugular vein, will be numbed.  An incision will be made in the numbed area.  A tiny tube will be  inserted through the incision. The tube will be passed into the jugular vein to a blood vessel in the liver called the hepatic vein.  A dye will be injected through the tube.  X-rays will be taken. The dye will make the blood vessels in the liver light up on the X-rays.  The biopsy needle will be placed through the tube until it reaches the liver.  Samples of liver tissue will be taken with the biopsy needle.  The needle and the tube will be removed.  The incision will be closed with stitches (sutures).  A bandage (dressing) may be placed over the incision. What happens after the procedure?  Your blood pressure, heart rate, breathing rate, and blood oxygen level will be monitored until you leave the hospital or clinic.  You will be asked to rest quietly for 2-4 hours or longer.  You will be closely monitored for bleeding from the biopsy site.  You may be allowed to go home when the medicines have worn off and you can walk, drink, eat, and use the bathroom. Summary  A liver biopsy is a procedure in which a tissue sample is taken from the liver and examined under a microscope.  This is a safe procedure, but problems can occur, including bleeding, infection, pain, or injury to nearby organs or tissues.  Ask your health care provider about changing or stopping your regular medicines.  Plan to have someone take you home from the hospital or clinic and to be with you for 24 hours after the procedure. This information is not intended to replace advice given to you by your health care provider. Make sure you discuss any questions you have with your health care provider. Document Released: 12/15/2003 Document Revised: 10/04/2017 Document Reviewed: 10/04/2017 Elsevier Patient Education  2020 [employer].

## 2019-07-02 ENCOUNTER — Other Ambulatory Visit: Payer: Self-pay

## 2019-07-02 ENCOUNTER — Encounter (HOSPITAL_COMMUNITY): Payer: Self-pay

## 2019-07-02 ENCOUNTER — Other Ambulatory Visit (HOSPITAL_COMMUNITY)
Admission: RE | Admit: 2019-07-02 | Discharge: 2019-07-02 | Disposition: A | Discharge status: Home / Self Care | Payer: PPO | Source: Ambulatory Visit | Attending: General Surgery | Admitting: General Surgery

## 2019-07-02 ENCOUNTER — Encounter (HOSPITAL_COMMUNITY)
Admission: RE | Admit: 2019-07-02 | Discharge: 2019-07-02 | Disposition: A | Discharge status: Home / Self Care | Payer: PPO | Source: Ambulatory Visit | Attending: General Surgery | Admitting: General Surgery

## 2019-07-02 DIAGNOSIS — K760 Fatty (change of) liver, not elsewhere classified: Secondary | ICD-10-CM | POA: Insufficient documentation

## 2019-07-02 DIAGNOSIS — K8020 Calculus of gallbladder without cholecystitis without obstruction: Secondary | ICD-10-CM | POA: Insufficient documentation

## 2019-07-02 DIAGNOSIS — Z20828 Contact with and (suspected) exposure to other viral communicable diseases: Secondary | ICD-10-CM | POA: Diagnosis not present

## 2019-07-02 DIAGNOSIS — Z01812 Encounter for preprocedural laboratory examination: Secondary | ICD-10-CM | POA: Diagnosis not present

## 2019-07-02 LAB — COMPREHENSIVE METABOLIC PANEL
ALT: 13 U/L (ref 0–44)
AST: 14 U/L — ABNORMAL LOW (ref 15–41)
Albumin: 4.4 g/dL (ref 3.5–5.0)
Alkaline Phosphatase: 82 U/L (ref 38–126)
Anion gap: 10 (ref 5–15)
BUN: 17 mg/dL (ref 8–23)
CO2: 23 mmol/L (ref 22–32)
Calcium: 9.3 mg/dL (ref 8.9–10.3)
Chloride: 104 mmol/L (ref 98–111)
Creatinine, Ser: 0.71 mg/dL (ref 0.61–1.24)
GFR calc Af Amer: >60 mL/min (ref >60)
GFR calc non Af Amer: >60 mL/min (ref >60)
Glucose, Bld: 86 mg/dL (ref 70–99)
Potassium: 4 mmol/L (ref 3.5–5.1)
Sodium: 137 mmol/L (ref 135–145)
Total Bilirubin: 0.8 mg/dL (ref 0.3–1.2)
Total Protein: 7.5 g/dL (ref 6.5–8.1)

## 2019-07-02 LAB — CBC WITH DIFFERENTIAL/PLATELET
Abs Immature Granulocytes: 0.03 10*3/uL (ref 0.00–0.07)
Basophils Absolute: 0 10*3/uL (ref 0.0–0.1)
Basophils Relative: 0 %
Eosinophils Absolute: 0.2 10*3/uL (ref 0.0–0.5)
Eosinophils Relative: 2 %
HCT: 47.8 % (ref 39.0–52.0)
Hemoglobin: 14.8 g/dL (ref 13.0–17.0)
Immature Granulocytes: 0 %
Lymphocytes Relative: 20 %
Lymphs Abs: 1.6 10*3/uL (ref 0.7–4.0)
MCH: 28.4 pg (ref 26.0–34.0)
MCHC: 31 g/dL (ref 30.0–36.0)
MCV: 91.6 fL (ref 80.0–100.0)
Monocytes Absolute: 0.5 10*3/uL (ref 0.1–1.0)
Monocytes Relative: 6 %
Neutro Abs: 5.7 10*3/uL (ref 1.7–7.7)
Neutrophils Relative %: 72 %
Platelets: 244 10*3/uL (ref 150–400)
RBC: 5.22 MIL/uL (ref 4.22–5.81)
RDW: 14.6 % (ref 11.5–15.5)
WBC: 8 10*3/uL (ref 4.0–10.5)
nRBC: 0 % (ref 0.0–0.2)

## 2019-07-02 LAB — SARS CORONAVIRUS 2 (TAT 6-24 HRS): SARS Coronavirus 2: NEGATIVE

## 2019-07-02 NOTE — Patient Instructions (Signed)
Bradley Espinoza  07/02/2019     @PREFPERIOPPHARMACY @   Your procedure is scheduled on  07/06/2019   Report to [facility] at  [email or web address].  Call this number if you have problems the morning of surgery:  [phone]   Remember:  Do not eat or drink after midnight.                        Take these medicines the morning of surgery with A SIP OF WATER  Pepcid, oxycodone(if needed), flomax.    Do not wear jewelry, make-up or nail polish.  Do not wear lotions, powders, or perfumes. Please wear deodorant and brush your teeth.  Do not shave 48 hours prior to surgery.  Men may shave face and neck.  Do not bring valuables to the hospital.  [facility] is not responsible for any belongings or valuables.  Contacts, dentures or bridgework may not be worn into surgery.  Leave your suitcase in the car.  After surgery it may be brought to your room.  For patients admitted to the hospital, discharge time will be determined by your treatment team.  Patients discharged the day of surgery will not be allowed to drive home.   Name and phone number of your driver:   family Special instructions:  None  Please read over the following fact sheets that you were given. Anesthesia Post-op Instructions and Care and Recovery After Surgery       Laparoscopic Cholecystectomy, Care After This sheet gives you information about how to care for yourself after your procedure. Your health care provider may also give you more specific instructions. If you have problems or questions, contact your health care provider. What can I expect after the procedure? After the procedure, it is common to have:  Pain at your incision sites. You will be given medicines to control this pain.  Mild nausea or vomiting.  Bloating and possible shoulder pain from the air-like gas that was used during the procedure. Follow these instructions at home: Incision care   Follow instructions from your health  care provider about how to take care of your incisions. Make sure you: ? Wash your hands with soap and water before you change your bandage (dressing). If soap and water are not available, use hand sanitizer. ? Change your dressing as told by your health care provider. ? Leave stitches (sutures), skin glue, or adhesive strips in place. These skin closures may need to be in place for 2 weeks or longer. If adhesive strip edges start to loosen and curl up, you may trim the loose edges. Do not remove adhesive strips completely unless your health care provider tells you to do that.  Do not take baths, swim, or use a hot tub until your health care provider approves. Ask your health care provider if you can take showers. You may only be allowed to take sponge baths for bathing.  Check your incision area every day for signs of infection. Check for: ? More redness, swelling, or pain. ? More fluid or blood. ? Warmth. ? Pus or a bad smell. Activity  Do not drive or use heavy machinery while taking prescription pain medicine.  Do not lift anything that is heavier than 10 lb (4.5 kg) until your health care provider approves.  Do not play contact sports until your health care provider approves.  Do not drive for 24  hours if you were given a medicine to help you relax (sedative).  Rest as needed. Do not return to work or school until your health care provider approves. General instructions  Take over-the-counter and prescription medicines only as told by your health care provider.  To prevent or treat constipation while you are taking prescription pain medicine, your health care provider may recommend that you: ? Drink enough fluid to keep your urine clear or pale yellow. ? Take over-the-counter or prescription medicines. ? Eat foods that are high in fiber, such as fresh fruits and vegetables, whole grains, and beans. ? Limit foods that are high in fat and processed sugars, such as fried and sweet  foods. Contact a health care provider if:  You develop a rash.  You have more redness, swelling, or pain around your incisions.  You have more fluid or blood coming from your incisions.  Your incisions feel warm to the touch.  You have pus or a bad smell coming from your incisions.  You have a fever.  One or more of your incisions breaks open. Get help right away if:  You have trouble breathing.  You have chest pain.  You have increasing pain in your shoulders.  You faint or feel dizzy when you stand.  You have severe pain in your abdomen.  You have nausea or vomiting that lasts for more than one day.  You have leg pain. This information is not intended to replace advice given to you by your health care provider. Make sure you discuss any questions you have with your health care provider. Document Released: 09/24/2005 Document Revised: 09/06/2017 Document Reviewed: 03/12/2016 Elsevier Patient Education  2020 [place].  Liver Biopsy, Care After These instructions give you information on caring for yourself after your procedure. Your doctor may also give you more specific instructions. Call your doctor if you have any problems or questions after your procedure. What can I expect after the procedure? After the procedure, it is common to have:  Pain and soreness where the biopsy was done.  Bruising around the area where the biopsy was done.  Sleepiness and be tired for a few days. Follow these instructions at home: Medicines  Take over-the-counter and prescription medicines only as told by your doctor.  If you were prescribed an antibiotic medicine, take it as told by your doctor. Do not stop taking the antibiotic even if you start to feel better.  Do not take medicines such as aspirin and ibuprofen. These medicines can thin your blood. Do not take these medicines unless your doctor tells you to take them.  If you are taking prescription pain medicine, take  actions to prevent or treat constipation. Your doctor may recommend that you: ? Drink enough fluid to keep your pee (urine) clear or pale yellow. ? Take over-the-counter or prescription medicines. ? Eat foods that are high in fiber, such as fresh fruits and vegetables, whole grains, and beans. ? Limit foods that are high in fat and processed sugars, such as fried and sweet foods. Caring for your cut  Follow instructions from your doctor about how to take care of your cuts from surgery (incisions). Make sure you: ? Wash your hands with soap and water before you change your bandage (dressing). If you cannot use soap and water, use hand sanitizer. ? Change your bandage as told by your doctor. ? Leave stitches (sutures), skin glue, or skin tape (adhesive) strips in place. They may need to stay in place  for 2 weeks or longer. If tape strips get loose and curl up, you may trim the loose edges. Do not remove tape strips completely unless your doctor says it is okay.  Check your cuts every day for signs of infection. Check for: ? Redness, swelling, or more pain. ? Fluid or blood. ? Pus or a bad smell. ? Warmth.  Do not take baths, swim, or use a hot tub until your doctor says it is okay to do so. Activity   Rest at home for 1-2 days or as told by your doctor. ? Avoid sitting for a long time without moving. Get up to take short walks every 1-2 hours.  Return to your normal activities as told by your doctor. Ask what activities are safe for you.  Do not do these things in the first 24 hours: ? Drive. ? Use machinery. ? Take a bath or shower.  Do not lift more than 10 pounds (4.5 kg) or play contact sports for the first 2 weeks. General instructions   Do not drink alcohol in the first week after the procedure.  Have someone stay with you for at least 24 hours after the procedure.  Get your test results. Ask your doctor or the department that is doing the test: ? When will my results be  ready? ? How will I get my results? ? What are my treatment options? ? What other tests do I need? ? What are my next steps?  Keep all follow-up visits as told by your doctor. This is important. Contact a doctor if:  A cut bleeds and leaves more than just a small spot of blood.  A cut is red, puffs up (swells), or hurts more than before.  Fluid or something else comes from a cut.  A cut smells bad.  You have a fever or chills. Get help right away if:  You have swelling, bloating, or pain in your belly (abdomen).  You get dizzy or faint.  You have a rash.  You feel sick to your stomach (nauseous) or throw up (vomit).  You have trouble breathing, feel short of breath, or feel faint.  Your chest hurts.  You have problems talking or seeing.  You have trouble with your balance or moving your arms or legs. Summary  After the procedure, it is common to have pain, soreness, bruising, and tiredness.  Your doctor will tell you how to take care of yourself at home. Change your bandage, take your medicines, and limit your activities as told by your doctor.  Call your doctor if you have symptoms of infection. Get help right away if your belly swells, your cut bleeds a lot, or you have trouble talking or breathing. This information is not intended to replace advice given to you by your health care provider. Make sure you discuss any questions you have with your health care provider. Document Released: 07/03/2008 Document Revised: 10/04/2017 Document Reviewed: 10/04/2017 Elsevier Patient Education  2020 [place] Anesthesia, Adult, Care After This sheet gives you information about how to care for yourself after your procedure. Your health care provider may also give you more specific instructions. If you have problems or questions, contact your health care provider. What can I expect after the procedure? After the procedure, the following side effects are common:   Pain or discomfort at the IV site.  Nausea.  Vomiting.  Sore throat.  Trouble concentrating.  Feeling cold or chills.  Weak or tired.  Sleepiness and fatigue.  Soreness and body aches. These side effects can affect parts of the body that were not involved in surgery. Follow these instructions at home:  For at least 24 hours after the procedure:  Have a responsible adult stay with you. It is important to have someone help care for you until you are awake and alert.  Rest as needed.  Do not: ? Participate in activities in which you could fall or become injured. ? Drive. ? Use heavy machinery. ? Drink alcohol. ? Take sleeping pills or medicines that cause drowsiness. ? Make important decisions or sign legal documents. ? Take care of children on your own. Eating and drinking  Follow any instructions from your health care provider about eating or drinking restrictions.  When you feel hungry, start by eating small amounts of foods that are soft and easy to digest (bland), such as toast. Gradually return to your regular diet.  Drink enough fluid to keep your urine pale yellow.  If you vomit, rehydrate by drinking water, juice, or clear broth. General instructions  If you have sleep apnea, surgery and certain medicines can increase your risk for breathing problems. Follow instructions from your health care provider about wearing your sleep device: ? Anytime you are sleeping, including during daytime naps. ? While taking prescription pain medicines, sleeping medicines, or medicines that make you drowsy.  Return to your normal activities as told by your health care provider. Ask your health care provider what activities are safe for you.  Take over-the-counter and prescription medicines only as told by your health care provider.  If you smoke, do not smoke without supervision.  Keep all follow-up visits as told by your health care provider. This is important. Contact a  health care provider if:  You have nausea or vomiting that does not get better with medicine.  You cannot eat or drink without vomiting.  You have pain that does not get better with medicine.  You are unable to pass urine.  You develop a skin rash.  You have a fever.  You have redness around your IV site that gets worse. Get help right away if:  You have difficulty breathing.  You have chest pain.  You have blood in your urine or stool, or you vomit blood. Summary  After the procedure, it is common to have a sore throat or nausea. It is also common to feel tired.  Have a responsible adult stay with you for the first 24 hours after general anesthesia. It is important to have someone help care for you until you are awake and alert.  When you feel hungry, start by eating small amounts of foods that are soft and easy to digest (bland), such as toast. Gradually return to your regular diet.  Drink enough fluid to keep your urine pale yellow.  Return to your normal activities as told by your health care provider. Ask your health care provider what activities are safe for you. This information is not intended to replace advice given to you by your health care provider. Make sure you discuss any questions you have with your health care provider. Document Released: 12/31/2000 Document Revised: 09/27/2017 Document Reviewed: 05/10/2017 Elsevier Patient Education  2020 [employer]. How to Use Chlorhexidine for Bathing Chlorhexidine gluconate (CHG) is a germ-killing (antiseptic) solution that is used to clean the skin. It can get rid of the bacteria that normally live on the skin and can keep them away for about 24 hours. To  clean your skin with CHG, you may be given:  A CHG solution to use in the shower or as part of a sponge bath.  A prepackaged cloth that contains CHG. Cleaning your skin with CHG may help lower the risk for infection:  While you are staying in the intensive care  unit of the hospital.  If you have a vascular access, such as a central line, to provide short-term or long-term access to your veins.  If you have a catheter to drain urine from your bladder.  If you are on a ventilator. A ventilator is a machine that helps you breathe by moving air in and out of your lungs.  After surgery. What are the risks? Risks of using CHG include:  A skin reaction.  Hearing loss, if CHG gets in your ears.  Eye injury, if CHG gets in your eyes and is not rinsed out.  The CHG product catching fire. Make sure that you avoid smoking and flames after applying CHG to your skin. Do not use CHG:  If you have a chlorhexidine allergy or have previously reacted to chlorhexidine.  On babies younger than 66 months of age. How to use CHG solution  Use CHG only as told by your health care provider, and follow the instructions on the label.  Use the full amount of CHG as directed. Usually, this is one bottle. During a shower Follow these steps when using CHG solution during a shower (unless your health care provider gives you different instructions): 1. Start the shower. 2. Use your normal soap and shampoo to wash your face and hair. 3. Turn off the shower or move out of the shower stream. 4. Pour the CHG onto a clean washcloth. Do not use any type of brush or rough-edged sponge. 5. Starting at your neck, lather your body down to your toes. Make sure you follow these instructions: ? If you will be having surgery, pay special attention to the part of your body where you will be having surgery. Scrub this area for at least 1 minute. ? Do not use CHG on your head or face. If the solution gets into your ears or eyes, rinse them well with water. ? Avoid your genital area. ? Avoid any areas of skin that have broken skin, cuts, or scrapes. ? Scrub your back and under your arms. Make sure to wash skin folds. 6. Let the lather sit on your skin for 1-2 minutes or as long as  told by your health care provider. 7. Thoroughly rinse your entire body in the shower. Make sure that all body creases and crevices are rinsed well. 8. Dry off with a clean towel. Do not put any substances on your body afterward-such as powder, lotion, or perfume-unless you are told to do so by your health care provider. Only use lotions that are recommended by the manufacturer. 9. Put on clean clothes or pajamas. 10. If it is the night before your surgery, sleep in clean sheets.  During a sponge bath Follow these steps when using CHG solution during a sponge bath (unless your health care provider gives you different instructions): 1. Use your normal soap and shampoo to wash your face and hair. 2. Pour the CHG onto a clean washcloth. 3. Starting at your neck, lather your body down to your toes. Make sure you follow these instructions: ? If you will be having surgery, pay special attention to the part of your body where you will be having surgery.  Scrub this area for at least 1 minute. ? Do not use CHG on your head or face. If the solution gets into your ears or eyes, rinse them well with water. ? Avoid your genital area. ? Avoid any areas of skin that have broken skin, cuts, or scrapes. ? Scrub your back and under your arms. Make sure to wash skin folds. 4. Let the lather sit on your skin for 1-2 minutes or as long as told by your health care provider. 5. Using a different clean, wet washcloth, thoroughly rinse your entire body. Make sure that all body creases and crevices are rinsed well. 6. Dry off with a clean towel. Do not put any substances on your body afterward-such as powder, lotion, or perfume-unless you are told to do so by your health care provider. Only use lotions that are recommended by the manufacturer. 7. Put on clean clothes or pajamas. 8. If it is the night before your surgery, sleep in clean sheets. How to use CHG prepackaged cloths  Only use CHG cloths as told by your  health care provider, and follow the instructions on the label.  Use the CHG cloth on clean, dry skin.  Do not use the CHG cloth on your head or face unless your health care provider tells you to.  When washing with the CHG cloth: ? Avoid your genital area. ? Avoid any areas of skin that have broken skin, cuts, or scrapes. Before surgery Follow these steps when using a CHG cloth to clean before surgery (unless your health care provider gives you different instructions): 1. Using the CHG cloth, vigorously scrub the part of your body where you will be having surgery. Scrub using a back-and-forth motion for 3 minutes. The area on your body should be completely wet with CHG when you are done scrubbing. 2. Do not rinse. Discard the cloth and let the area air-dry. Do not put any substances on the area afterward, such as powder, lotion, or perfume. 3. Put on clean clothes or pajamas. 4. If it is the night before your surgery, sleep in clean sheets.  For general bathing Follow these steps when using CHG cloths for general bathing (unless your health care provider gives you different instructions). 1. Use a separate CHG cloth for each area of your body. Make sure you wash between any folds of skin and between your fingers and toes. Wash your body in the following order, switching to a new cloth after each step: ? The front of your neck, shoulders, and chest. ? Both of your arms, under your arms, and your hands. ? Your stomach and groin area, avoiding the genitals. ? Your right leg and foot. ? Your left leg and foot. ? The back of your neck, your back, and your buttocks. 2. Do not rinse. Discard the cloth and let the area air-dry. Do not put any substances on your body afterward-such as powder, lotion, or perfume-unless you are told to do so by your health care provider. Only use lotions that are recommended by the manufacturer. 3. Put on clean clothes or pajamas. Contact a health care provider if:   Your skin gets irritated after scrubbing.  You have questions about using your solution or cloth. Get help right away if:  Your eyes become very red or swollen.  Your eyes itch badly.  Your skin itches badly and is red or swollen.  Your hearing changes.  You have trouble seeing.  You have swelling or tingling in your  mouth or throat.  You have trouble breathing.  You swallow any chlorhexidine. Summary  Chlorhexidine gluconate (CHG) is a germ-killing (antiseptic) solution that is used to clean the skin. Cleaning your skin with CHG may help to lower your risk for infection.  You may be given CHG to use for bathing. It may be in a bottle or in a prepackaged cloth to use on your skin. Carefully follow your health care provider's instructions and the instructions on the product label.  Do not use CHG if you have a chlorhexidine allergy.  Contact your health care provider if your skin gets irritated after scrubbing. This information is not intended to replace advice given to you by your health care provider. Make sure you discuss any questions you have with your health care provider. Document Released: 06/18/2012 Document Revised: 12/11/2018 Document Reviewed: 08/22/2017 Elsevier Patient Education  2020 [employer].

## 2019-07-03 NOTE — H&P (Signed)
[facility] History and Physical  Reason for Referral: Gallbladder Referring Physician:  Dr. Laural Golden      Chief Complaint    Abdominal Pain      Bradley Espinoza is a 63 y.o. male.  HPI: Mr. Sturkie si a 63 yo with NAFLD who has complaints of chronic epigastric pain with associated nausea and bloating for the past few months. He has a history of past peptic ulcers and takes a lot ibuprofen, so he was worked up by Dr. Laural Golden and underwent an EGD and colonoscopy. The EGD was positive for gastritis but no ulcer, and the colonoscopy demonstrated diverticulosis and polyps.   The patient says he continues to have abdominal pain which is worse after eating, and that it makes him feel very full and bloated. He says that this has continued despite trying different antiacid medications. He says that he also is in a pain contract for chronic back pain, and has seen Dr. Sharol Given recently for his right foot and ankle.         Past Medical History:  Diagnosis Date  . Anxiety   . Arthritis    low back pain, lumbar radiculopathy  . Depression   . Fracture    B/L ankles  . GERD (gastroesophageal reflux disease)   . Headache(784.0)    allergy related   . History of kidney stones   . History of stomach ulcers   . Retained orthopedic hardware    failed retained hardware right foot  . Wears glasses          Past Surgical History:  Procedure Laterality Date  . ANKLE FUSION Right 05/11/2015   Procedure: Right Posterior Arthroscopic Subtalar Arthrodesis;  Surgeon: Newt Minion, MD;  Location: [place];  Service: Orthopedics;  Laterality: Right;  . ANKLE FUSION Right 11/27/2017   Procedure: RIGHT TIBIOCALCANEAL FUSION;  Surgeon: Newt Minion, MD;  Location: [place];  Service: Orthopedics;  Laterality: Right;  . ANKLE FUSION Right 11/26/2018   Procedure: RIGHT ANTERIOR ANKLE FUSION, APPLY VAC;  Surgeon: Newt Minion, MD;  Location: [place];  Service:  Orthopedics;  Laterality: Right;  . ANTERIOR LAT LUMBAR FUSION N/A 04/05/2017   Procedure: Extreme Lateral Interbody Fusion - Lumbar three-lumbar four ,exploration of fusion Posterior augmentation with globus addition Removal hardware Lumbar one-three. Lumbar four-sacral one,  Removal internal bone growth stimulator;  Surgeon: Kary Kos, MD;  Location: [place];  Service: Neurosurgery;  Laterality: N/A;  . APPLICATION OF WOUND VAC  12/23/2018   Procedure: Application Of Wound Vac;  Surgeon: Newt Minion, MD;  Location: [place];  Service: Orthopedics;;  . BACK SURGERY  2004   x 2  . BIOPSY  06/05/2019   Procedure: BIOPSY;  Surgeon: Rogene Houston, MD;  [facility];  Service: Endoscopy;;  gastric biopsy  . CERVICAL SPINE SURGERY  2008  . COLONOSCOPY WITH PROPOFOL N/A 06/05/2019   Procedure: COLONOSCOPY WITH PROPOFOL;  Surgeon: Rogene Houston, MD;  [facility];  Service: Endoscopy;  Laterality: N/A;  . ESOPHAGOGASTRODUODENOSCOPY    . ESOPHAGOGASTRODUODENOSCOPY (EGD) WITH PROPOFOL N/A 06/05/2019   Procedure: ESOPHAGOGASTRODUODENOSCOPY (EGD) WITH PROPOFOL;  Surgeon: Rogene Houston, MD;  [facility];  Service: Endoscopy;  Laterality: N/A;  1220pm  . HARDWARE REMOVAL Right 10/09/2014   Procedure: Removal Deep Hardware, Irrigation and Debridement Calcaneus, Place Antibiotic Beads and Wound VAC ;  Surgeon: Newt Minion, MD;  Location: [place];  Service: Orthopedics;  Laterality: Right;  . HARDWARE REMOVAL Right 08/12/2015   Procedure: Removal Deep Hardware Right Foot;  Surgeon: Newt Minion, MD;  Location: [place];  Service: Orthopedics;  Laterality: Right;  . HARDWARE REMOVAL Right 11/26/2018   Procedure: REMOVAL RIGHT TIBIOCALCANEAL NAIL;  Surgeon: Newt Minion, MD;  Location: [place];  Service: Orthopedics;  Laterality: Right;  . HARDWARE REMOVAL Right 12/23/2018   Procedure: RIGHT ANKLE REMOVE HARDWARE, PLACE ANTIBIOTIC BEADS and placement of wound vac;   Surgeon: Newt Minion, MD;  Location: [place];  Service: Orthopedics;  Laterality: Right;  . I&D EXTREMITY Right 09/15/2014   Procedure: IRRIGATION AND DEBRIDEMENT Ankle;  Surgeon: Renette Butters, MD;  Location: [place];  Service: Orthopedics;  Laterality: Right;  . INGUINAL HERNIA REPAIR Bilateral   . LAMINECTOMY WITH POSTERIOR LATERAL ARTHRODESIS LEVEL 4 N/A 09/10/2018   Procedure: Posterior Lateral Fusion - Thoracic Eleven-Thoracic Twelve - Thoracic Twelve-Lumbar One - Lumbar One-Lumbar Two - Lumbar Two-Lumbar Three with instrumentaion and PLA;  Surgeon: Kary Kos, MD;  Location: [place];  Service: Neurosurgery;  Laterality: N/A;  Posterior Lateral Fusion - Thoracic Eleven-Thoracic Twelve - Thoracic Twelve-Lumbar One - Lumbar One-Lumbar Two - Lumbar Two-Lumbar Thre  . LUMBAR PERCUTANEOUS PEDICLE SCREW 1 LEVEL N/A 04/05/2017   Procedure: LUMBAR PERCUTANEOUS PEDICLE SCREW LUMBAR THREE-FOUR;  Surgeon: Kary Kos, MD;  Location: [place];  Service: Neurosurgery;  Laterality: N/A;  . ORIF CALCANEOUS FRACTURE Right 09/19/2014   Procedure: OPEN REDUCTION INTERNAL FIXATION (ORIF) CALCANEOUS FRACTURE;  Surgeon: Newt Minion, MD;  Location: [place];  Service: Orthopedics;  Laterality: Right;  . ORIF CALCANEOUS FRACTURE Left 09/19/2014   Procedure: OPEN REDUCTION INTERNAL FIXATION (ORIF) CALCANEOUS FRACTURE;  Surgeon: Newt Minion, MD;  Location: [place];  Service: Orthopedics;  Laterality: Left;  . POLYPECTOMY  06/05/2019   Procedure: POLYPECTOMY;  Surgeon: Rogene Houston, MD;  [facility];  Service: Endoscopy;;  cecal polyp biopsy forcep, ascending polyp cold snare         Family History  Problem Relation Age of Onset  . Diabetes Father   . Cancer Other     Social History        Tobacco Use  . Smoking status: Former Smoker    Packs/day: 1.00    Years: 10.00    Pack years: 10.00    Types: Cigars    Start date: 10/08/1976    Quit date: 09/07/2014    Years since  quitting: 4.8  . Smokeless tobacco: Never Used  . Tobacco comment: quit smoking cegar 09/10/2018  Substance Use Topics  . Alcohol use: No    Comment: 06/26/16 quit 1998  . Drug use: No    Medications: I have reviewed the patient's current medications.      Allergies as of 06/30/2019      Reactions   Codeine Nausea And Vomiting         Medication List       Accurate as of June 30, 2019 11:18 AM. If you have any questions, ask your nurse or doctor.        bismuth subsalicylate [id number] suspension Commonly known as: PEPTO BISMOL Take 30 mLs by mouth every 6 (six) hours as needed for indigestion or diarrhea or loose stools.   docusate sodium 100 MG capsule Commonly known as: COLACE Take 100 mg by mouth at bedtime.   gabapentin 300 MG capsule Commonly known as: NEURONTIN Take 300-600 mg by mouth See admin instructions. Take 300mg   by mouth in the morning, 300 mg in the afternoon and 600 mg at bedtime   ibuprofen 200 MG tablet Commonly known as: ADVIL Take 800 mg by mouth every 6 (six) hours as needed for moderate pain.   omeprazole 40 MG capsule Commonly known as: PRILOSEC Take 1 capsule (40 mg total) by mouth daily.   ondansetron 4 MG tablet Commonly known as: ZOFRAN Take 1 tablet (4 mg total) by mouth every 8 (eight) hours as needed for nausea or vomiting.   oxyCODONE-acetaminophen 10-325 MG tablet Commonly known as: PERCOCET Take 1 tablet by mouth every 6 (six) hours as needed for pain.   polyethylene glycol powder 17 GM/SCOOP powder Commonly known as: GLYCOLAX/MIRALAX Take 17 g by mouth daily.   senna-docusate 8.6-50 MG tablet Commonly known as: Senokot-S Take 1 tablet by mouth every other day.   simethicone 125 MG chewable tablet Commonly known as: MYLICON Chew 0000000 mg by mouth every 6 (six) hours as needed for flatulence.   tamsulosin 0.4 MG Caps capsule Commonly known as: FLOMAX Take 0.4 mg by mouth daily.   testosterone  cypionate 200 MG/ML injection Commonly known as: DEPOTESTOSTERONE CYPIONATE Inject 200 mg into the muscle every 14 (fourteen) days.   vitamin C 500 MG tablet Commonly known as: ASCORBIC ACID Take 500 mg by mouth daily.   zinc gluconate 50 MG tablet Take 50 mg by mouth daily.        ROS:  A comprehensive review of systems was negative except for: Gastrointestinal: positive for abdominal pain, constipation, nausea, reflux symptoms and bloating Musculoskeletal: positive for back pain, neck pain and right foot pain  Blood pressure 140/75, pulse 78, temperature 97.7 F (36.5 C), temperature source Tympanic, resp. rate 16, height 6' (1.829 m), weight 163 lb (73.9 kg), SpO2 95 %. Physical Exam Vitals signs reviewed.  Constitutional:      Appearance: He is well-developed.  HENT:     Head: Normocephalic.  Eyes:     Extraocular Movements: Extraocular movements intact.  Cardiovascular:     Rate and Rhythm: Normal rate and regular rhythm.  Pulmonary:     Effort: Pulmonary effort is normal.     Breath sounds: Normal breath sounds.  Abdominal:     General: There is no distension.     Palpations: Abdomen is soft.     Tenderness: There is abdominal tenderness in the right upper quadrant and epigastric area. There is no guarding or rebound.  Skin:    General: Skin is warm and dry.  Neurological:     General: No focal deficit present.     Mental Status: He is oriented to person, place, and time.  Psychiatric:        Mood and Affect: Mood normal.        Behavior: Behavior normal.     Results: [place] 12/2018- personally reviewed gallstones without thickening  IMPRESSION: ULTRASOUND ABDOMEN:  Cholelithiasis. No sonographic signs of cholecystitis or biliary obstruction.  Hepatic steatosis.  Mild splenomegaly.  ULTRASOUND HEPATIC ELASTOGRAPHY:  Median hepatic shear wave velocity is calculated at 1.20 m/sec.  Corresponding Metavir fibrosis score is F2 + some F3.   Risk of fibrosis is Moderate.  Follow-up: Additional testing appropriate   Assessment & Plan:  Bradley Espinoza is a 63 y.o. male with non alcoholic fatty liver disease and what sounds like symptomatic cholelithiasis with some chronic abdominal pain after eating and nausea/bloating. He has continued to have symptoms and has tried to alter his anti-acids.  He has  undergone EGD with Dr. Laural Golden.   -PLAN: I counseled the patient about the indication, risks and benefits of laparoscopic cholecystectomy.  He understands there is a very small chance for bleeding, infection, injury to normal structures (including common bile duct), conversion to open surgery, persistent symptoms, evolution of postcholecystectomy diarrhea, need for secondary interventions, anesthesia reaction, cardiopulmonary issues and other risks not specifically detailed here. I described the expected recovery, the plan for follow-up and the restrictions during the recovery phase.  All questions were answered.  -In addition to the laparoscopic cholecystectomy we discussed liver biopsy to give Dr. Laural Golden more information about the liver. -Discussed the increased risk of bleeding from the biopsy site.  -Discussed COVID testing prior to any surgery and need for isolation after testing.  -Discussed that with the pain contract, he will likely just stay on his Percocet he takes every 6 hours for pain control   All questions were answered to the satisfaction of the patient.    Virl Cagey 06/30/2019, 11:18 AM

## 2019-07-06 ENCOUNTER — Ambulatory Visit (HOSPITAL_COMMUNITY): Payer: PPO | Admitting: Anesthesiology

## 2019-07-06 ENCOUNTER — Other Ambulatory Visit: Payer: Self-pay

## 2019-07-06 ENCOUNTER — Ambulatory Visit (HOSPITAL_COMMUNITY)
Admission: RE | Admit: 2019-07-06 | Discharge: 2019-07-06 | Disposition: A | Discharge status: Home / Self Care | Payer: PPO | Attending: General Surgery | Admitting: General Surgery

## 2019-07-06 ENCOUNTER — Encounter (HOSPITAL_COMMUNITY)
Admission: RE | Disposition: A | Discharge status: Home / Self Care | Payer: Self-pay | Source: Home / Self Care | Attending: General Surgery

## 2019-07-06 ENCOUNTER — Encounter (HOSPITAL_COMMUNITY): Payer: Self-pay

## 2019-07-06 DIAGNOSIS — Z79899 Other long term (current) drug therapy: Secondary | ICD-10-CM | POA: Diagnosis not present

## 2019-07-06 DIAGNOSIS — M549 Dorsalgia, unspecified: Secondary | ICD-10-CM | POA: Insufficient documentation

## 2019-07-06 DIAGNOSIS — K8010 Calculus of gallbladder with chronic cholecystitis without obstruction: Secondary | ICD-10-CM | POA: Diagnosis not present

## 2019-07-06 DIAGNOSIS — G8929 Other chronic pain: Secondary | ICD-10-CM | POA: Insufficient documentation

## 2019-07-06 DIAGNOSIS — Z87891 Personal history of nicotine dependence: Secondary | ICD-10-CM | POA: Insufficient documentation

## 2019-07-06 DIAGNOSIS — Z79890 Hormone replacement therapy (postmenopausal): Secondary | ICD-10-CM | POA: Diagnosis not present

## 2019-07-06 DIAGNOSIS — K760 Fatty (change of) liver, not elsewhere classified: Secondary | ICD-10-CM | POA: Diagnosis not present

## 2019-07-06 DIAGNOSIS — K219 Gastro-esophageal reflux disease without esophagitis: Secondary | ICD-10-CM | POA: Diagnosis not present

## 2019-07-06 DIAGNOSIS — K8020 Calculus of gallbladder without cholecystitis without obstruction: Secondary | ICD-10-CM

## 2019-07-06 HISTORY — PX: LIVER BIOPSY: SHX301

## 2019-07-06 HISTORY — PX: CHOLECYSTECTOMY: SHX55

## 2019-07-06 SURGERY — LAPAROSCOPIC CHOLECYSTECTOMY
Anesthesia: General

## 2019-07-06 MED ORDER — PHENYLEPHRINE 40 MCG/ML (10ML) SYRINGE FOR IV PUSH (FOR BLOOD PRESSURE SUPPORT)
PREFILLED_SYRINGE | INTRAVENOUS | Status: AC
  Filled 2019-07-06: qty 20

## 2019-07-06 MED ORDER — EPHEDRINE 5 MG/ML INJ
INTRAVENOUS | Status: AC
  Filled 2019-07-06: qty 10

## 2019-07-06 MED ORDER — PROPOFOL 10 MG/ML IV BOLUS
INTRAVENOUS | Status: DC | PRN
  Administered 2019-07-06: 160 mg via INTRAVENOUS

## 2019-07-06 MED ORDER — FENTANYL CITRATE (PF) 250 MCG/5ML IJ SOLN
INTRAMUSCULAR | Status: AC
  Filled 2019-07-06: qty 5

## 2019-07-06 MED ORDER — MIDAZOLAM HCL 2 MG/2ML IJ SOLN
INTRAMUSCULAR | Status: DC | PRN
  Administered 2019-07-06: 1 mg via INTRAVENOUS

## 2019-07-06 MED ORDER — SUCCINYLCHOLINE CHLORIDE 200 MG/10ML IV SOSY
PREFILLED_SYRINGE | INTRAVENOUS | Status: DC | PRN
  Administered 2019-07-06: 160 mg via INTRAVENOUS

## 2019-07-06 MED ORDER — SUCCINYLCHOLINE CHLORIDE 200 MG/10ML IV SOSY
PREFILLED_SYRINGE | INTRAVENOUS | Status: AC
  Filled 2019-07-06: qty 10

## 2019-07-06 MED ORDER — CHLORHEXIDINE GLUCONATE CLOTH 2 % EX PADS: 6.0000 | MEDICATED_PAD | Freq: Once | CUTANEOUS | Status: DC

## 2019-07-06 MED ORDER — DEXAMETHASONE SODIUM PHOSPHATE 4 MG/ML IJ SOLN
INTRAMUSCULAR | Status: DC | PRN
  Administered 2019-07-06: 8 mg via INTRAVENOUS

## 2019-07-06 MED ORDER — LIDOCAINE HCL (CARDIAC) PF 100 MG/5ML IV SOSY
PREFILLED_SYRINGE | INTRAVENOUS | Status: DC | PRN
  Administered 2019-07-06: 80 mg via INTRAVENOUS

## 2019-07-06 MED ORDER — HYDROMORPHONE HCL 1 MG/ML IJ SOLN
0.5000 mg | INTRAMUSCULAR | Status: DC | PRN
  Administered 2019-07-06 (×3): 0.5 mg via INTRAVENOUS
  Filled 2019-07-06 (×2): qty 0.5

## 2019-07-06 MED ORDER — FENTANYL CITRATE (PF) 100 MCG/2ML IJ SOLN
INTRAMUSCULAR | Status: DC | PRN
  Administered 2019-07-06 (×4): 50 ug via INTRAVENOUS
  Administered 2019-07-06 (×2): 25 ug via INTRAVENOUS

## 2019-07-06 MED ORDER — PROPOFOL 10 MG/ML IV BOLUS
INTRAVENOUS | Status: AC
  Filled 2019-07-06: qty 40

## 2019-07-06 MED ORDER — ROCURONIUM BROMIDE 10 MG/ML (PF) SYRINGE
PREFILLED_SYRINGE | INTRAVENOUS | Status: AC
  Filled 2019-07-06: qty 10

## 2019-07-06 MED ORDER — BUPIVACAINE HCL (PF) 0.5 % IJ SOLN
INTRAMUSCULAR | Status: DC | PRN
  Administered 2019-07-06: 20 mL

## 2019-07-06 MED ORDER — HEMOSTATIC AGENTS (NO CHARGE) OPTIME
TOPICAL | Status: DC | PRN
  Administered 2019-07-06: 1 via TOPICAL

## 2019-07-06 MED ORDER — SODIUM CHLORIDE 0.9 % IV SOLN
2.0000 g | INTRAVENOUS | Status: AC
  Administered 2019-07-06: 2 g via INTRAVENOUS
  Filled 2019-07-06: qty 2

## 2019-07-06 MED ORDER — LIDOCAINE 2% (20 MG/ML) 5 ML SYRINGE
INTRAMUSCULAR | Status: AC
  Filled 2019-07-06: qty 5

## 2019-07-06 MED ORDER — PROMETHAZINE HCL 25 MG/ML IJ SOLN: 6.2500 mg | INTRAMUSCULAR | Status: DC | PRN

## 2019-07-06 MED ORDER — ROCURONIUM BROMIDE 100 MG/10ML IV SOLN
INTRAVENOUS | Status: DC | PRN
  Administered 2019-07-06: 25 mg via INTRAVENOUS
  Administered 2019-07-06: 5 mg via INTRAVENOUS

## 2019-07-06 MED ORDER — LACTATED RINGERS IV SOLN
INTRAVENOUS | Status: DC | PRN
  Administered 2019-07-06 (×2): via INTRAVENOUS

## 2019-07-06 MED ORDER — LACTATED RINGERS IV SOLN
INTRAVENOUS | Status: DC
  Administered 2019-07-06: 07:00:00 via INTRAVENOUS

## 2019-07-06 MED ORDER — SUGAMMADEX SODIUM 200 MG/2ML IV SOLN
INTRAVENOUS | Status: DC | PRN
  Administered 2019-07-06: 200 mg via INTRAVENOUS

## 2019-07-06 MED ORDER — GLYCOPYRROLATE PF 0.2 MG/ML IJ SOSY
PREFILLED_SYRINGE | INTRAMUSCULAR | Status: AC
  Filled 2019-07-06: qty 2

## 2019-07-06 MED ORDER — ONDANSETRON HCL 4 MG/2ML IJ SOLN
INTRAMUSCULAR | Status: AC
  Filled 2019-07-06: qty 2

## 2019-07-06 MED ORDER — DEXAMETHASONE SODIUM PHOSPHATE 10 MG/ML IJ SOLN
INTRAMUSCULAR | Status: AC
  Filled 2019-07-06: qty 1

## 2019-07-06 MED ORDER — MIDAZOLAM HCL 2 MG/2ML IJ SOLN: 0.5000 mg | Freq: Once | INTRAMUSCULAR | Status: DC | PRN

## 2019-07-06 MED ORDER — HYDROMORPHONE HCL 1 MG/ML IJ SOLN
INTRAMUSCULAR | Status: AC
  Filled 2019-07-06: qty 0.5

## 2019-07-06 MED ORDER — SODIUM CHLORIDE 0.9 % IR SOLN
Status: DC | PRN
  Administered 2019-07-06: 1000 mL

## 2019-07-06 MED ORDER — PHENYLEPHRINE 40 MCG/ML (10ML) SYRINGE FOR IV PUSH (FOR BLOOD PRESSURE SUPPORT)
PREFILLED_SYRINGE | INTRAVENOUS | Status: AC
  Filled 2019-07-06: qty 10

## 2019-07-06 MED ORDER — BUPIVACAINE HCL (PF) 0.5 % IJ SOLN
INTRAMUSCULAR | Status: AC
  Filled 2019-07-06: qty 30

## 2019-07-06 MED ORDER — MIDAZOLAM HCL 2 MG/2ML IJ SOLN
INTRAMUSCULAR | Status: AC
  Filled 2019-07-06: qty 2

## 2019-07-06 SURGICAL SUPPLY — 48 items
APPLICATOR ARISTA FLEXITIP XL (MISCELLANEOUS) ×2 IMPLANT
APPLIER CLIP ROT 10 11.4 M/L (STAPLE) ×2
BAG RETRIEVAL 10 (BASKET) ×1
BLADE SURG 15 STRL LF DISP TIS (BLADE) ×1 IMPLANT
BLADE SURG 15 STRL SS (BLADE) ×1
CHLORAPREP W/TINT 26 (MISCELLANEOUS) ×2 IMPLANT
CLIP APPLIE ROT 10 11.4 M/L (STAPLE) ×1 IMPLANT
CLOTH BEACON ORANGE TIMEOUT ST (SAFETY) ×2 IMPLANT
COVER LIGHT HANDLE STERIS (MISCELLANEOUS) ×4 IMPLANT
COVER WAND RF STERILE (DRAPES) ×2 IMPLANT
DECANTER SPIKE VIAL GLASS SM (MISCELLANEOUS) ×2 IMPLANT
DERMABOND ADVANCED (GAUZE/BANDAGES/DRESSINGS) ×1
DERMABOND ADVANCED .7 DNX12 (GAUZE/BANDAGES/DRESSINGS) ×1 IMPLANT
ELECT REM PT RETURN 9FT ADLT (ELECTROSURGICAL) ×2
ELECTRODE REM PT RTRN 9FT ADLT (ELECTROSURGICAL) ×1 IMPLANT
FILTER SMOKE EVAC LAPAROSHD (FILTER) ×2 IMPLANT
GLOVE BIO SURGEON STRL SZ 6.5 (GLOVE) ×4 IMPLANT
GLOVE BIOGEL M STRL SZ7.5 (GLOVE) ×2 IMPLANT
GLOVE BIOGEL PI IND STRL 6.5 (GLOVE) ×2 IMPLANT
GLOVE BIOGEL PI IND STRL 7.0 (GLOVE) ×3 IMPLANT
GLOVE BIOGEL PI IND STRL 7.5 (GLOVE) ×1 IMPLANT
GLOVE BIOGEL PI INDICATOR 6.5 (GLOVE) ×2
GLOVE BIOGEL PI INDICATOR 7.0 (GLOVE) ×3
GLOVE BIOGEL PI INDICATOR 7.5 (GLOVE) ×1
GOWN STRL REUS W/TWL LRG LVL3 (GOWN DISPOSABLE) ×6 IMPLANT
HEMOSTAT ARISTA ABSORB 3G PWDR (HEMOSTASIS) ×2 IMPLANT
HEMOSTAT SNOW SURGICEL 2X4 (HEMOSTASIS) ×2 IMPLANT
INST SET LAPROSCOPIC AP (KITS) ×2 IMPLANT
IV NS IRRIG 3000ML ARTHROMATIC (IV SOLUTION) IMPLANT
KIT TURNOVER KIT A (KITS) ×2 IMPLANT
MANIFOLD NEPTUNE II (INSTRUMENTS) ×2 IMPLANT
NEEDLE INSUFFLATION 14GA 120MM (NEEDLE) ×2 IMPLANT
NS IRRIG 1000ML POUR BTL (IV SOLUTION) ×2 IMPLANT
PACK LAP CHOLE LZT030E (CUSTOM PROCEDURE TRAY) ×2 IMPLANT
PAD ARMBOARD 7.5X6 YLW CONV (MISCELLANEOUS) ×2 IMPLANT
SET BASIN LINEN APH (SET/KITS/TRAYS/PACK) ×2 IMPLANT
SET TUBE IRRIG SUCTION NO TIP (IRRIGATION / IRRIGATOR) IMPLANT
SLEEVE ENDOPATH XCEL 5M (ENDOMECHANICALS) ×2 IMPLANT
SUT MNCRL AB 4-0 PS2 18 (SUTURE) ×2 IMPLANT
SUT VICRYL 0 UR6 27IN ABS (SUTURE) ×2 IMPLANT
SYS BAG RETRIEVAL 10MM (BASKET) ×1
SYSTEM BAG RETRIEVAL 10MM (BASKET) ×1 IMPLANT
TROCAR ENDO BLADELESS 11MM (ENDOMECHANICALS) ×2 IMPLANT
TROCAR XCEL NON-BLD 5MMX100MML (ENDOMECHANICALS) ×2 IMPLANT
TROCAR XCEL UNIV SLVE 11M 100M (ENDOMECHANICALS) ×2 IMPLANT
TUBE CONNECTING 12X1/4 (SUCTIONS) ×2 IMPLANT
TUBING INSUFFLATION (TUBING) ×2 IMPLANT
WARMER LAPAROSCOPE (MISCELLANEOUS) ×2 IMPLANT

## 2019-07-06 NOTE — Anesthesia Preprocedure Evaluation (Signed)
Anesthesia Evaluation  Patient identified by MRN, date of birth, ID band Patient awake    Reviewed: Allergy & Precautions, NPO status , Patient's Chart, lab work & pertinent test results  Airway Mallampati: I  TM Distance: >3 FB Neck ROM: Full    Dental no notable dental hx. (+) Partial Upper   Pulmonary neg pulmonary ROS, former smoker,  Smokes intermittantly  States last yesterday    Pulmonary exam normal breath sounds clear to auscultation       Cardiovascular Exercise Tolerance: Good negative cardio ROS Normal cardiovascular examI Rhythm:Regular Rate:Normal  Had fall 5 years ago, still using crutches and daily pain meds  Denies MI/DOE or any known cardiac issues   Neuro/Psych  Headaches, PSYCHIATRIC DISORDERS Anxiety Depression    GI/Hepatic Neg liver ROS, GERD  Medicated and Controlled,  Endo/Other  negative endocrine ROS  Renal/GU negative Renal ROS  negative genitourinary   Musculoskeletal  (+) Arthritis ,   Abdominal   Peds negative pediatric ROS (+)  Hematology negative hematology ROS (+)   Anesthesia Other Findings   Reproductive/Obstetrics negative OB ROS                             Anesthesia Physical Anesthesia Plan  ASA: II  Anesthesia Plan: General   Post-op Pain Management:    Induction: Intravenous  PONV Risk Score and Plan: 2 and Midazolam, Dexamethasone, Ondansetron and Treatment may vary due to age or medical condition  Airway Management Planned: Oral ETT  Additional Equipment:   Intra-op Plan:   Post-operative Plan: Extubation in OR  Informed Consent: I have reviewed the patients History and Physical, chart, labs and discussed the procedure including the risks, benefits and alternatives for the proposed anesthesia with the patient or authorized representative who has indicated his/her understanding and acceptance.     Dental advisory given  Plan  Discussed with: CRNA  Anesthesia Plan Comments: (Plan Full PPE use Plan GETA D/W PT -WTP with same after Q&A)        Anesthesia Quick Evaluation

## 2019-07-06 NOTE — Anesthesia Procedure Notes (Signed)
Procedure Name: Intubation Date/Time: 07/06/2019 7:43 AM Performed by: Georgeanne Nim, CRNA Pre-anesthesia Checklist: Patient identified, Emergency Drugs available, Suction available, Patient being monitored and Timeout performed Patient Re-evaluated:Patient Re-evaluated prior to induction Oxygen Delivery Method: Circle system utilized Preoxygenation: Pre-oxygenation with 100% oxygen Induction Type: IV induction Laryngoscope Size: Mac and 4 Grade View: Grade I Tube type: Oral Tube size: 7.5 mm Number of attempts: 1 Airway Equipment and Method: Stylet Placement Confirmation: ETT inserted through vocal cords under direct vision,  positive ETCO2,  CO2 detector and breath sounds checked- equal and bilateral Secured at: 22 cm Tube secured with: Tape Dental Injury: Teeth and Oropharynx as per pre-operative assessment

## 2019-07-06 NOTE — Anesthesia Postprocedure Evaluation (Signed)
Anesthesia Post Note  Patient: Bradley Espinoza  Procedure(s) Performed: LAPAROSCOPIC CHOLECYSTECTOMY (N/A ) LIVER BIOPSY (N/A )  Patient location during evaluation: PACU Anesthesia Type: General Level of consciousness: awake and awake and alert Pain management: pain level controlled Vital Signs Assessment: post-procedure vital signs reviewed and stable Respiratory status: spontaneous breathing Cardiovascular status: stable Postop Assessment: no apparent nausea or vomiting Anesthetic complications: no     Last Vitals:  Vitals:   07/06/19 0930 07/06/19 0940  BP: (!) 159/90 (!) 161/82  Pulse: 72 83  Resp: 16 16  Temp:  36.9 C  SpO2: 93% 94%    Last Pain:  Vitals:   07/06/19 0940  TempSrc: Oral  PainSc: 8                  Everette Rank

## 2019-07-06 NOTE — Transfer of Care (Signed)
Immediate Anesthesia Transfer of Care Note  Patient: Bradley Espinoza  Procedure(s) Performed: LAPAROSCOPIC CHOLECYSTECTOMY (N/A ) LIVER BIOPSY (N/A )  Patient Location: PACU  Anesthesia Type:General  Level of Consciousness: awake and patient cooperative  Airway & Oxygen Therapy: Patient Spontanous Breathing and Patient connected to face mask oxygen  Post-op Assessment: Report given to RN and Post -op Vital signs reviewed and stable  Post vital signs: Reviewed and stable  Last Vitals:  Vitals Value Taken Time  BP    Temp    Pulse 77 07/06/19 0853  Resp 18 07/06/19 0853  SpO2 100 % 07/06/19 0853  Vitals shown include unvalidated device data.  Last Pain:  Vitals:   07/06/19 0704  TempSrc: Oral  PainSc: 8          Complications: No apparent anesthesia complications

## 2019-07-06 NOTE — Discharge Instructions (Signed)
Discharge Laparoscopic Surgery Instructions:  Common Complaints: Right shoulder pain is common after laparoscopic surgery. This is secondary to the gas used in the surgery being trapped under the diaphragm.  Walk to help your body absorb the gas. This will improve in a few days. Pain at the port sites are common, especially the larger port sites. This will improve with time.  Some nausea is common and poor appetite. The main goal is to stay hydrated the first few days after surgery.   Diet/ Activity: Diet as tolerated. You may not have an appetite, but it is important to stay hydrated. Drink 64 ounces of water a day. Your appetite will return with time.  Shower per your regular routine daily.  Do not take hot showers. Take warm showers that are less than 10 minutes. Rest and listen to your body, but do not remain in bed all day.  Walk everyday for at least 15-20 minutes. Deep cough and move around every 1-2 hours in the first few days after surgery.  Do not lift > 10 lbs, perform excessive bending, pushing, pulling, squatting for 1-2 weeks after surgery.  Do not pick at the dermabond glue on your incision sites.  This glue film will remain in place for 1-2 weeks and will start to peel off.  Do not place lotions or balms on your incision unless instructed to specifically by Dr. Constance Haw.   Medication: Take tylenol and ibuprofen as needed for pain control, alternating every 4-6 hours.  Take your home percocet for break through pain. Due to your dose and pain prescription/ contract, I am not going to be able to prescribe more pain medication for your cholecystectomy.  Take Colace for constipation related to narcotic pain medication.  If you do not have a bowel movement in 2 days, take Miralax over the counter.  Drink plenty of water to also prevent constipation.   Contact Information: If you have questions or concerns, please call our office, [phone], Monday- Thursday 8AM-5PM and Friday  8AM-12Noon.  If it is after hours or on the weekend, please call [facility] Main Number, [phone], and ask to speak to the surgeon on call for Dr. Constance Haw at [facility].     Laparoscopic Cholecystectomy, Care After This sheet gives you information about how to care for yourself after your procedure. Your doctor may also give you more specific instructions. If you have problems or questions, contact your doctor. Follow these instructions at home: Care for cuts from surgery (incisions)   Follow instructions from your doctor about how to take care of your cuts from surgery. Make sure you: ? Wash your hands with soap and water before you change your bandage (dressing). If you cannot use soap and water, use hand sanitizer. ? Change your bandage as told by your doctor. ? Leave stitches (sutures), skin glue, or skin tape (adhesive) strips in place. They may need to stay in place for 2 weeks or longer. If tape strips get loose and curl up, you may trim the loose edges. Do not remove tape strips completely unless your doctor says it is okay.  Do not take baths, swim, or use a hot tub until your doctor says it is okay.   You may shower.  Check your surgical cut area every day for signs of infection. Check for: ? More redness, swelling, or pain. ? More fluid or blood. ? Warmth. ? Pus or a bad smell. Activity  Do not drive or use heavy machinery while taking  prescription pain medicine.  Do not lift anything that is heavier than 10 lb (4.5 kg) until your doctor says it is okay.  Do not play contact sports until your doctor says it is okay.  Do not drive for 24 hours if you were given a medicine to help you relax (sedative).  Rest as needed. Do not return to work or school until your doctor says it is okay. General instructions  Take over-the-counter and prescription medicines only as told by your doctor.  To prevent or treat constipation while you are taking prescription pain medicine,  your doctor may recommend that you: ? Drink enough fluid to keep your pee (urine) clear or pale yellow. ? Take over-the-counter or prescription medicines. ? Eat foods that are high in fiber, such as fresh fruits and vegetables, whole grains, and beans. ? Limit foods that are high in fat and processed sugars, such as fried and sweet foods. Contact a doctor if:  You develop a rash.  You have more redness, swelling, or pain around your surgical cuts.  You have more fluid or blood coming from your surgical cuts.  Your surgical cuts feel warm to the touch.  You have pus or a bad smell coming from your surgical cuts.  You have a fever.  One or more of your surgical cuts breaks open. Get help right away if:  You have trouble breathing.  You have chest pain.  You have pain that is getting worse in your shoulders.  You faint or feel dizzy when you stand.  You have very bad pain in your belly (abdomen).  You are sick to your stomach (nauseous) for more than one day.  You have throwing up (vomiting) that lasts for more than one day.  You have leg pain. This information is not intended to replace advice given to you by your health care provider. Make sure you discuss any questions you have with your health care provider. Document Released: 07/03/2008 Document Revised: 09/06/2017 Document Reviewed: 03/12/2016 Elsevier Patient Education  2020 [employer].

## 2019-07-06 NOTE — Op Note (Signed)
Operative Note   Preoperative Diagnosis: Symptomatic cholelithiasis, fatty liver disease   Postoperative Diagnosis: Same   Procedure(s) Performed: Laparoscopic cholecystectomy; wedge liver biopsy    Surgeon: Lanell Matar. Constance Haw, MD   Assistants: Aviva Signs, MD    Anesthesia: General endotracheal   Anesthesiologist: Lenice Llamas, MD    Specimens: Gallbladder; wedge liver biopsy    Estimated Blood Loss: Minimal    Blood Replacement: None    Complications: None    Operative Findings: Gallbladder with stones    Procedure: The patient was taken to the operating room and placed supine. General endotracheal anesthesia was induced. Intravenous antibiotics were administered per protocol. An orogastric tube positioned to decompress the stomach. The abdomen was prepared and draped in the usual sterile fashion.    A supraumbilical incision was made and a Veress technique was utilized to achieve pneumoperitoneum to 15 mmHg with carbon dioxide. A 11 mm optiview port was placed through the supraumbilical region, and a 10 mm 0-degree operative laparoscope was introduced. The area underlying the trocar and Veress needle were inspected and without evidence of injury.  Remaining trocars were placed under direct vision. Two 5 mm ports were placed in the right abdomen, between the anterior axillary and midclavicular line.  A final 11 mm port was placed through the mid-epigastrium, near the falciform ligament.    The gallbladder fundus was elevated cephalad and the infundibulum was retracted to the patient's right. The gallbladder/cystic duct junction was skeletonized. The cystic artery noted in the triangle of Calot and was also skeletonized.  We then continued liberal medial and lateral dissection until the critical view of safety was achieved.    There was a small vessel running anterior to the cystic duct that looked like cautery would not control, so it was clipped and divided.  The cystic duct  was triply clipped and divided. The cystic artery was doubly clipped and divided.  The gallbladder was then dissected from the liver bed with electrocautery.  A wedge liver biopsy was performed with cautery. The specimens were placed in an Endopouch and was retrieved through the epigastric site.   Final inspection revealed acceptable hemostasis. Arista and Surgical Emogene Morgan were placed in the gallbladder bed. Trocars were removed and pneumoperitoneum was released. 0 Vicryl fascial sutures were used to close the umbilical port site, and the epigastric site was at the ribs.  Skin incisions were closed with 4-0 Monocryl subcuticular sutures and Dermabond. The patient was awakened from anesthesia and extubated without complication.    Curlene Labrum, MD [facility] [address] [place], [place] [address] [phone] (office)

## 2019-07-06 NOTE — Interval H&P Note (Signed)
History and Physical Interval Note:  07/06/2019 7:15 AM  Bradley Espinoza  has presented today for surgery, with the diagnosis of cholelithiasis and fatty liver.  The various methods of treatment have been discussed with the patient and family. After consideration of risks, benefits and other options for treatment, the patient has consented to  Procedure(s): LAPAROSCOPIC CHOLECYSTECTOMY (N/A) LIVER BIOPSY (N/A) as a surgical intervention.  The patient's history has been reviewed, patient examined, no change in status, stable for surgery.  I have reviewed the patient's chart and labs.  Questions were answered to the patient's satisfaction.    No changes. No questions.  Virl Cagey

## 2019-07-06 NOTE — Progress Notes (Signed)
[facility]  Patient has ride and does not want me to discuss the case. No person called regarding surgery.   Curlene Labrum, MD [facility] [address] [place], [place] [address] [phone] (office)

## 2019-07-07 ENCOUNTER — Telehealth: Payer: Self-pay

## 2019-07-07 ENCOUNTER — Telehealth: Payer: Self-pay | Admitting: General Surgery

## 2019-07-07 ENCOUNTER — Other Ambulatory Visit: Payer: Self-pay | Admitting: General Surgery

## 2019-07-07 ENCOUNTER — Encounter (HOSPITAL_COMMUNITY): Payer: Self-pay | Admitting: General Surgery

## 2019-07-07 LAB — SURGICAL PATHOLOGY

## 2019-07-07 MED ORDER — OXYCODONE HCL 5 MG PO TABS: 5.0000 mg | ORAL_TABLET | Freq: Four times a day (QID) | ORAL | 0 refills | Status: DC | PRN

## 2019-07-07 NOTE — Telephone Encounter (Signed)
[facility]  Patient gets chronic pain medication from Leonie Green NP with [facility] and gets 120 pills of percocet monthly.   Patient having breakthrough pain after laparoscopic cholecystectomy.   I am going to prescribe Roxicodone 5mg  q 6 PRN for breakthrough pain. I am only giving him 20 tablets.  Notified Leonie Green NP office and her medical assistant took down the information.  Curlene Labrum, MD [facility] [address] [place], [place] [address] [phone] (office)

## 2019-07-07 NOTE — Telephone Encounter (Signed)
Patient called stating that he is having breakthrough pain between doses of his already prescribed pain meds. Patient states that he would like a prescription for oxy 5 mg to take between doses. MD notified.

## 2019-07-09 ENCOUNTER — Ambulatory Visit: Payer: PPO | Admitting: Orthopedic Surgery

## 2019-07-15 ENCOUNTER — Telehealth: Payer: Self-pay

## 2019-07-15 ENCOUNTER — Other Ambulatory Visit: Payer: Self-pay | Admitting: General Surgery

## 2019-07-15 MED ORDER — ONDANSETRON HCL 4 MG PO TABS: 4.0000 mg | ORAL_TABLET | Freq: Three times a day (TID) | ORAL | 1 refills | Status: DC | PRN

## 2019-07-15 NOTE — Telephone Encounter (Signed)
Patient left voice mail stating that he was nauseated after surgery. Patient states that he can not eat and feels sick. Patient requests medication for nausea. Dr Arnoldo Morale notified as Dr Constance Haw is out of the office.

## 2019-07-15 NOTE — Progress Notes (Unsigned)
zofran

## 2019-07-16 ENCOUNTER — Encounter: Payer: Self-pay | Admitting: Orthopedic Surgery

## 2019-07-16 ENCOUNTER — Ambulatory Visit (INDEPENDENT_AMBULATORY_CARE_PROVIDER_SITE_OTHER): Payer: PPO | Admitting: Orthopedic Surgery

## 2019-07-16 ENCOUNTER — Ambulatory Visit (INDEPENDENT_AMBULATORY_CARE_PROVIDER_SITE_OTHER): Payer: PPO

## 2019-07-16 VITALS — Ht 72.0 in | Wt 165.0 lb

## 2019-07-16 DIAGNOSIS — Z981 Arthrodesis status: Secondary | ICD-10-CM

## 2019-07-17 ENCOUNTER — Encounter: Payer: Self-pay | Admitting: Orthopedic Surgery

## 2019-07-17 NOTE — Progress Notes (Signed)
Office Visit Note   Patient: Bradley Espinoza           Date of Birth: 1955/12/07           MRN: [id number] Visit Date: 07/16/2019              Requested by: Celene Squibb, MD [place],  [place] [address] PCP: Celene Squibb, MD  Chief Complaint  Patient presents with  . Right Ankle - Follow-up    S/p 12/2018 right ankle remove hardware      HPI: Patient is a 63 year old gentleman who presents status post previous tibial talar fusion who developed an infection hardware removal and now has a fibrous fusion across the ankle joint.  Patient states he has burning stinging and throbbing when he wakes up in the morning he states when he puts weight on his foot the symptoms resolve.  He states he does not have any range of motion of his ankle yet.  He does not want to go to therapy.  He states he is getting epidural steroid soon and just recently had a cholecystectomy.  Assessment & Plan: Visit Diagnoses:  1. H/O ankle fusion     Plan: Patient is currently wearing a brace and a left in the right shoe.  He is given a new prescription for biotech to put a lift in a sneaker.  Follow-Up Instructions: Return in about 3 months (around 10/16/2019).   Ortho Exam  Patient is alert, oriented, no adenopathy, well-dressed, normal affect, normal respiratory effort. Examination patient's ankle has no redness no swelling no wound dehiscence his foot is plantigrade at 90 degrees.  Patient's ankle is stable there is no movement with attempted distraction.  Imaging: No results found. No images are attached to the encounter.  Labs: Lab Results  Component Value Date   HGBA1C 5.1 06/26/2016   ESRSEDRATE 9 12/08/2018   REPTSTATUS 12/28/2018 FINAL 12/23/2018   GRAMSTAIN  12/23/2018    FEW WBC PRESENT,BOTH PMN AND MONONUCLEAR RARE GRAM POSITIVE COCCI Performed at [facility], [address] [place], [place] [address]    CULT  12/23/2018    FEW STAPHYLOCOCCUS AUREUS NO  ANAEROBES ISOLATED; CULTURE IN PROGRESS FOR 5 DAYS    LABORGA STAPHYLOCOCCUS AUREUS 12/23/2018     Lab Results  Component Value Date   ALBUMIN 4.4 07/02/2019   ALBUMIN 4.3 08/12/2015   ALBUMIN 4.1 05/06/2015    No results found for: MG No results found for: VD25OH  No results found for: PREALBUMIN CBC EXTENDED Latest Ref Rng & Units 07/02/2019 12/19/2018 11/26/2018  WBC 4.0 - 10.5 K/uL 8.0 6.9 5.3  RBC 4.22 - 5.81 MIL/uL 5.22 4.03(L) 4.67  HGB 13.0 - 17.0 g/dL 14.8 11.8(L) 14.1  HCT 39.0 - 52.0 % 47.8 36.5(L) 44.8  PLT 150 - 400 K/uL 244 214 198  NEUTROABS 1.7 - 7.7 K/uL 5.7 - -  LYMPHSABS 0.7 - 4.0 K/uL 1.6 - -     Body mass index is 22.38 kg/m.  Orders:  Orders Placed This Encounter  Procedures  . XR Ankle 2 Views Right   No orders of the defined types were placed in this encounter.    Procedures: No procedures performed  Clinical Data: No additional findings.  ROS:  All other systems negative, except as noted in the HPI. Review of Systems  Objective: Vital Signs: Ht 6' (1.829 m)   Wt 165 lb (74.8 kg)   BMI 22.38 kg/m  Specialty Comments:  No specialty comments available.  PMFS History: Patient Active Problem List   Diagnosis Date Noted  . Fatty liver 06/30/2019  . Calculus of gallbladder without cholecystitis without obstruction 06/30/2019  . Nausea without vomiting 05/13/2019  . Abdominal bloating 05/13/2019  . Wound infection following procedure 12/19/2018  . Malunion of joint fusion ([place]) 11/26/2018  . 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HCC)   . Status post lumbar spinal fusion 09/10/2018  . S/P ankle fusion 11/27/2017  . Nonunion of subtalar arthrodesis   . Avascular necrosis of talus, right ([place])   . Post-traumatic osteoarthritis, left ankle and foot 10/22/2017  . DDD (degenerative disc disease), lumbar 04/05/2017  . Fracture of L2 vertebra ([place]) 01/12/2015  . Acute osteomyelitis of calcaneum ([place]) 10/09/2014  . Dysuria 10/05/2014  . Cellulitis  10/05/2014  . Fall from ladder 09/21/2014  . L2 vertebral fracture ([place]) 09/21/2014  . Bilateral calcaneal fractures 09/21/2014  . Chronic pain 09/21/2014  . Acute blood loss anemia 09/21/2014  . Open fracture of both calcanei 09/15/2014   Past Medical History:  Diagnosis Date  . Anxiety   . Arthritis    low back pain, lumbar radiculopathy  . Depression   . Fracture    B/L ankles  . GERD (gastroesophageal reflux disease)   . Headache(784.0)    allergy related   . History of kidney stones   . History of stomach ulcers   . Retained orthopedic hardware    failed retained hardware right foot  . Wears glasses     Family History  Problem Relation Age of Onset  . Diabetes Father   . Cancer Other     Past Surgical History:  Procedure Laterality Date  . ANKLE FUSION Right 05/11/2015   Procedure: Right Posterior Arthroscopic Subtalar Arthrodesis;  Surgeon: Newt Minion, MD;  Location: [place];  Service: Orthopedics;  Laterality: Right;  . ANKLE FUSION Right 11/27/2017   Procedure: RIGHT TIBIOCALCANEAL FUSION;  Surgeon: Newt Minion, MD;  Location: [place];  Service: Orthopedics;  Laterality: Right;  . ANKLE FUSION Right 11/26/2018   Procedure: RIGHT ANTERIOR ANKLE FUSION, APPLY VAC;  Surgeon: Newt Minion, MD;  Location: [place];  Service: Orthopedics;  Laterality: Right;  . ANTERIOR LAT LUMBAR FUSION N/A 04/05/2017   Procedure: Extreme Lateral Interbody Fusion - Lumbar three-lumbar four ,exploration of fusion Posterior augmentation with globus addition Removal hardware Lumbar one-three. Lumbar four-sacral one,  Removal internal bone growth stimulator;  Surgeon: Kary Kos, MD;  Location: [place];  Service: Neurosurgery;  Laterality: N/A;  . APPLICATION OF WOUND VAC  12/23/2018   Procedure: Application Of Wound Vac;  Surgeon: Newt Minion, MD;  Location: [place];  Service: Orthopedics;;  . BACK SURGERY  2004   x 2  . BIOPSY  06/05/2019   Procedure: BIOPSY;  Surgeon: Rogene Houston, MD;   [facility];  Service: Endoscopy;;  gastric biopsy  . CERVICAL SPINE SURGERY  2008  . CHOLECYSTECTOMY N/A 07/06/2019   Procedure: LAPAROSCOPIC CHOLECYSTECTOMY;  Surgeon: Virl Cagey, MD;  [facility];  Service: General;  Laterality: N/A;  . COLONOSCOPY WITH PROPOFOL N/A 06/05/2019   Procedure: COLONOSCOPY WITH PROPOFOL;  Surgeon: Rogene Houston, MD;  [facility];  Service: Endoscopy;  Laterality: N/A;  . ESOPHAGOGASTRODUODENOSCOPY    . ESOPHAGOGASTRODUODENOSCOPY (EGD) WITH PROPOFOL N/A 06/05/2019   Procedure: ESOPHAGOGASTRODUODENOSCOPY (EGD) WITH PROPOFOL;  Surgeon: Rogene Houston, MD;  [facility];  Service: Endoscopy;  Laterality: N/A;  1220pm  . HARDWARE REMOVAL Right 10/09/2014   Procedure: Removal Deep Hardware, Irrigation and Debridement Calcaneus, Place Antibiotic Beads and Wound VAC ;  Surgeon: Newt Minion, MD;  Location: [place];  Service: Orthopedics;  Laterality: Right;  . HARDWARE REMOVAL Right 08/12/2015   Procedure: Removal Deep Hardware Right Foot;  Surgeon: Newt Minion, MD;  Location: [place];  Service: Orthopedics;  Laterality: Right;  . HARDWARE REMOVAL Right 11/26/2018   Procedure: REMOVAL RIGHT TIBIOCALCANEAL NAIL;  Surgeon: Newt Minion, MD;  Location: [place];  Service: Orthopedics;  Laterality: Right;  . HARDWARE REMOVAL Right 12/23/2018   Procedure: RIGHT ANKLE REMOVE HARDWARE, PLACE ANTIBIOTIC BEADS and placement of wound vac;  Surgeon: Newt Minion, MD;  Location: [place];  Service: Orthopedics;  Laterality: Right;  . I&D EXTREMITY Right 09/15/2014   Procedure: IRRIGATION AND DEBRIDEMENT Ankle;  Surgeon: Renette Butters, MD;  Location: [place];  Service: Orthopedics;  Laterality: Right;  . INGUINAL HERNIA REPAIR Bilateral   . LAMINECTOMY WITH POSTERIOR LATERAL ARTHRODESIS LEVEL 4 N/A 09/10/2018   Procedure: Posterior Lateral Fusion - Thoracic Eleven-Thoracic Twelve - Thoracic Twelve-Lumbar One - Lumbar One-Lumbar Two - Lumbar  Two-Lumbar Three with instrumentaion and PLA;  Surgeon: Kary Kos, MD;  Location: [place];  Service: Neurosurgery;  Laterality: N/A;  Posterior Lateral Fusion - Thoracic Eleven-Thoracic Twelve - Thoracic Twelve-Lumbar One - Lumbar One-Lumbar Two - Lumbar Two-Lumbar Thre  . LIVER BIOPSY N/A 07/06/2019   Procedure: LIVER BIOPSY;  Surgeon: Virl Cagey, MD;  [facility];  Service: General;  Laterality: N/A;  . LUMBAR PERCUTANEOUS PEDICLE SCREW 1 LEVEL N/A 04/05/2017   Procedure: LUMBAR PERCUTANEOUS PEDICLE SCREW LUMBAR THREE-FOUR;  Surgeon: Kary Kos, MD;  Location: [place] [facility];  Service: Neurosurgery;  Laterality: N/A;  . ORIF CALCANEOUS FRACTURE Right 09/19/2014   Procedure: OPEN REDUCTION INTERNAL FIXATION (ORIF) CALCANEOUS FRACTURE;  Surgeon: Newt Minion, MD;  Location: [place];  Service: Orthopedics;  Laterality: Right;  . ORIF CALCANEOUS FRACTURE Left 09/19/2014   Procedure: OPEN REDUCTION INTERNAL FIXATION (ORIF) CALCANEOUS FRACTURE;  Surgeon: Newt Minion, MD;  Location: [place];  Service: Orthopedics;  Laterality: Left;  . POLYPECTOMY  06/05/2019   Procedure: POLYPECTOMY;  Surgeon: Rogene Houston, MD;  [facility];  Service: Endoscopy;;  cecal polyp biopsy forcep, ascending polyp cold snare   Social History   Occupational History    Comment: disabled  Tobacco Use  . Smoking status: Former Smoker    Packs/day: 1.00    Years: 10.00    Pack years: 10.00    Types: Cigars    Start date: 10/08/1976    Quit date: 09/2017    Years since quitting: 1.8  . Smokeless tobacco: Never Used  . Tobacco comment: quit smoking cegar 09/10/2018  Substance and Sexual Activity  . Alcohol use: No    Comment: 06/26/16 quit 1998  . Drug use: No  . Sexual activity: Yes    Birth control/protection: None

## 2019-07-21 ENCOUNTER — Other Ambulatory Visit: Payer: Self-pay

## 2019-07-21 ENCOUNTER — Telehealth (INDEPENDENT_AMBULATORY_CARE_PROVIDER_SITE_OTHER): Payer: Self-pay | Admitting: General Surgery

## 2019-07-21 DIAGNOSIS — K760 Fatty (change of) liver, not elsewhere classified: Secondary | ICD-10-CM

## 2019-07-21 DIAGNOSIS — M47816 Spondylosis without myelopathy or radiculopathy, lumbar region: Secondary | ICD-10-CM | POA: Diagnosis not present

## 2019-07-21 DIAGNOSIS — K8020 Calculus of gallbladder without cholecystitis without obstruction: Secondary | ICD-10-CM

## 2019-07-21 MED ORDER — ONDANSETRON HCL 4 MG PO TABS: 4.0000 mg | ORAL_TABLET | Freq: Three times a day (TID) | ORAL | 1 refills | Status: DC | PRN

## 2019-07-21 NOTE — Progress Notes (Signed)
[facility]  I am calling the patient for post operative evaluation due to the current COVID 19 pandemic.  The patient had a laparoscopic cholecystectomy and liver biopsy on 07/06/19. The patient reports that they are doing ok but is having pain on his side.  He is still having some nausea with his eating. He is using the zofran. He has pain mostly at the side ports.  The are tolerating a diet, having ok pain control but this is getting better, and having regular Bms.  Told him to take ibuprofen and he already has pain medication for his back.   Pathology: DIAGNOSIS:   A. LIVER, BIOPSY:  - Liver parenchyma with mild steatosis. See note  - No pathologic fibrosis   B. GALLBLADDER  - Chronic cholecystitis and cholelithiasis   Will see the patient PRN. He will give [place] a call if he still having issues.   Curlene Labrum, MD [facility] [address] [place], [place] [address] [phone] (office)

## 2019-07-30 ENCOUNTER — Encounter (HOSPITAL_COMMUNITY): Payer: Self-pay | Admitting: Internal Medicine

## 2019-08-04 DIAGNOSIS — M47816 Spondylosis without myelopathy or radiculopathy, lumbar region: Secondary | ICD-10-CM | POA: Diagnosis not present

## 2019-08-11 ENCOUNTER — Other Ambulatory Visit: Payer: Self-pay

## 2019-08-11 ENCOUNTER — Ambulatory Visit (INDEPENDENT_AMBULATORY_CARE_PROVIDER_SITE_OTHER): Payer: PPO | Admitting: Orthopedic Surgery

## 2019-08-11 ENCOUNTER — Encounter: Payer: Self-pay | Admitting: Orthopedic Surgery

## 2019-08-11 VITALS — Ht 72.0 in | Wt 165.0 lb

## 2019-08-11 DIAGNOSIS — M19171 Post-traumatic osteoarthritis, right ankle and foot: Secondary | ICD-10-CM

## 2019-08-11 NOTE — Progress Notes (Signed)
Office Visit Note   Patient: Bradley Espinoza           Date of Birth: 1955-12-31           MRN: [id number] Visit Date: 08/11/2019              Requested by: Celene Squibb, MD [place],  [place] [address] PCP: Celene Squibb, MD  Chief Complaint  Patient presents with  . Right Foot - Follow-up, Pain      HPI: Patient is a 63 year old gentleman who presents in follow-up for both lower extremities.  He has had open calcaneus fracture on the right and is undergone multiple surgical interventions for limb salvage.  Patient has a fibrous tibiotalar union and despite wearing a shoe lift and double upright braces he still has pain with activities of daily living.  He states it wakes him up at night he complains of stinging and burning pain radiating around the ankle.  Patient states he can no longer wait to see if this will resolve on its own.  Patient states he has stopped smoking.  Assessment & Plan: Visit Diagnoses: No diagnosis found.  Plan: Discussed that the best option to relieve pain would be a transtibial amputation.  Patient states that he is asked for this in the past but he feels like after 2 days after surgery that he would regret his decision.  Patient states he wants to proceed with limb salvage.  Discussed that we could proceed with a lateral incision for debridement of the fibrous union packed this with demineralized bone graft and placed him in a short leg cast.  Discussed that even without using hardware he is still an increased risk of recurrent infection and increased risk of the fibrous union with persistent pain.  Patient states he understands and wished to proceed with surgery as soon as possible.  Follow-Up Instructions: Return in about 1 week (around 08/18/2019) for Plan to follow-up 1 week postoperatively.M[facility] Passey Exam  Patient is alert, oriented, no adenopathy, well-dressed, normal affect, normal respiratory effort. Examination there is no redness  no swelling no cellulitis.  He has a anterior incision over the tibia.  His dorsalis pedis pulses intact he has good sensation in the superficial and deep peroneal nerve distribution.  Radiographs shows a fibrous union across the tibiotalar joint.  Imaging: No results found. No images are attached to the encounter.  Labs: Lab Results  Component Value Date   HGBA1C 5.1 06/26/2016   ESRSEDRATE 9 12/08/2018   REPTSTATUS 12/28/2018 FINAL 12/23/2018   GRAMSTAIN  12/23/2018    FEW WBC PRESENT,BOTH PMN AND MONONUCLEAR RARE GRAM POSITIVE COCCI Performed at [facility], [address], [place], [place] [address]    CULT  12/23/2018    FEW STAPHYLOCOCCUS AUREUS NO ANAEROBES ISOLATED; CULTURE IN PROGRESS FOR 5 DAYS    LABORGA STAPHYLOCOCCUS AUREUS 12/23/2018     Lab Results  Component Value Date   ALBUMIN 4.4 07/02/2019   ALBUMIN 4.3 08/12/2015   ALBUMIN 4.1 05/06/2015    No results found for: MG No results found for: VD25OH  No results found for: PREALBUMIN CBC EXTENDED Latest Ref Rng & Units 07/02/2019 12/19/2018 11/26/2018  WBC 4.0 - 10.5 K/uL 8.0 6.9 5.3  RBC 4.22 - 5.81 MIL/uL 5.22 4.03(L) 4.67  HGB 13.0 - 17.0 g/dL 14.8 11.8(L) 14.1  HCT 39.0 - 52.0 % 47.8 36.5(L) 44.8  PLT 150 - 400 K/uL 244 214  198  NEUTROABS 1.7 - 7.7 K/uL 5.7 - -  LYMPHSABS 0.7 - 4.0 K/uL 1.6 - -     Body mass index is 22.38 kg/m.  Orders:  No orders of the defined types were placed in this encounter.  No orders of the defined types were placed in this encounter.    Procedures: No procedures performed  Clinical Data: No additional findings.  ROS:  All other systems negative, except as noted in the HPI. Review of Systems  Objective: Vital Signs: Ht 6' (1.829 m)   Wt 165 lb (74.8 kg)   BMI 22.38 kg/m   Specialty Comments:  No specialty comments available.  PMFS History: Patient Active Problem List   Diagnosis Date Noted  . Fatty liver 06/30/2019  . Calculus of  gallbladder without cholecystitis without obstruction 06/30/2019  . Nausea without vomiting 05/13/2019  . Abdominal bloating 05/13/2019  . Wound infection following procedure 12/19/2018  . Malunion of joint fusion ([place]) 11/26/2018  . 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HCC)   . Status post lumbar spinal fusion 09/10/2018  . S/P ankle fusion 11/27/2017  . Nonunion of subtalar arthrodesis   . Avascular necrosis of talus, right ([place])   . Post-traumatic osteoarthritis, left ankle and foot 10/22/2017  . DDD (degenerative disc disease), lumbar 04/05/2017  . Fracture of L2 vertebra ([place]) 01/12/2015  . Acute osteomyelitis of calcaneum ([place]) 10/09/2014  . Dysuria 10/05/2014  . Cellulitis 10/05/2014  . Fall from ladder 09/21/2014  . L2 vertebral fracture ([place]) 09/21/2014  . Bilateral calcaneal fractures 09/21/2014  . Chronic pain 09/21/2014  . Acute blood loss anemia 09/21/2014  . Open fracture of both calcanei 09/15/2014   Past Medical History:  Diagnosis Date  . Anxiety   . Arthritis    low back pain, lumbar radiculopathy  . Depression   . Fracture    B/L ankles  . GERD (gastroesophageal reflux disease)   . Headache(784.0)    allergy related   . History of kidney stones   . History of stomach ulcers   . Retained orthopedic hardware    failed retained hardware right foot  . Wears glasses     Family History  Problem Relation Age of Onset  . Diabetes Father   . Cancer Other     Past Surgical History:  Procedure Laterality Date  . ANKLE FUSION Right 05/11/2015   Procedure: Right Posterior Arthroscopic Subtalar Arthrodesis;  Surgeon: Newt Minion, MD;  Location: [place];  Service: Orthopedics;  Laterality: Right;  . ANKLE FUSION Right 11/27/2017   Procedure: RIGHT TIBIOCALCANEAL FUSION;  Surgeon: Newt Minion, MD;  Location: [place];  Service: Orthopedics;  Laterality: Right;  . ANKLE FUSION Right 11/26/2018   Procedure: RIGHT ANTERIOR ANKLE FUSION, APPLY VAC;  Surgeon: Newt Minion, MD;   Location: [place];  Service: Orthopedics;  Laterality: Right;  . ANTERIOR LAT LUMBAR FUSION N/A 04/05/2017   Procedure: Extreme Lateral Interbody Fusion - Lumbar three-lumbar four ,exploration of fusion Posterior augmentation with globus addition Removal hardware Lumbar one-three. Lumbar four-sacral one,  Removal internal bone growth stimulator;  Surgeon: Kary Kos, MD;  Location: [place];  Service: Neurosurgery;  Laterality: N/A;  . APPLICATION OF WOUND VAC  12/23/2018   Procedure: Application Of Wound Vac;  Surgeon: Newt Minion, MD;  Location: [place];  Service: Orthopedics;;  . BACK SURGERY  2004   x 2  . BIOPSY  06/05/2019   Procedure: BIOPSY;  Surgeon: Rogene Houston, MD;  [facility];  Service: Endoscopy;;  gastric biopsy  . CERVICAL SPINE SURGERY  2008  . CHOLECYSTECTOMY N/A 07/06/2019   Procedure: LAPAROSCOPIC CHOLECYSTECTOMY;  Surgeon: Virl Cagey, MD;  [facility];  Service: General;  Laterality: N/A;  . COLONOSCOPY WITH PROPOFOL N/A 06/05/2019   Procedure: COLONOSCOPY WITH PROPOFOL;  Surgeon: Rogene Houston, MD;  [facility];  Service: Endoscopy;  Laterality: N/A;  . ESOPHAGOGASTRODUODENOSCOPY    . ESOPHAGOGASTRODUODENOSCOPY (EGD) WITH PROPOFOL N/A 06/05/2019   Procedure: ESOPHAGOGASTRODUODENOSCOPY (EGD) WITH PROPOFOL;  Surgeon: Rogene Houston, MD;  [facility];  Service: Endoscopy;  Laterality: N/A;  1220pm  . HARDWARE REMOVAL Right 10/09/2014   Procedure: Removal Deep Hardware, Irrigation and Debridement Calcaneus, Place Antibiotic Beads and Wound VAC ;  Surgeon: Newt Minion, MD;  Location: [place];  Service: Orthopedics;  Laterality: Right;  . HARDWARE REMOVAL Right 08/12/2015   Procedure: Removal Deep Hardware Right Foot;  Surgeon: Newt Minion, MD;  Location: [place];  Service: Orthopedics;  Laterality: Right;  . HARDWARE REMOVAL Right 11/26/2018   Procedure: REMOVAL RIGHT TIBIOCALCANEAL NAIL;  Surgeon: Newt Minion, MD;  Location: [place];  Service: Orthopedics;  Laterality: Right;  . HARDWARE REMOVAL Right 12/23/2018   Procedure: RIGHT ANKLE REMOVE HARDWARE, PLACE ANTIBIOTIC BEADS and placement of wound vac;  Surgeon: Newt Minion, MD;  Location: [place];  Service: Orthopedics;  Laterality: Right;  . I&D EXTREMITY Right 09/15/2014   Procedure: IRRIGATION AND DEBRIDEMENT Ankle;  Surgeon: Renette Butters, MD;  Location: [facility];  Service: Orthopedics;  Laterality: Right;  . INGUINAL HERNIA REPAIR Bilateral   . LAMINECTOMY WITH POSTERIOR LATERAL ARTHRODESIS LEVEL 4 N/A 09/10/2018   Procedure: Posterior Lateral Fusion - Thoracic Eleven-Thoracic Twelve - Thoracic Twelve-Lumbar One - Lumbar One-Lumbar Two - Lumbar Two-Lumbar Three with instrumentaion and PLA;  Surgeon: Kary Kos, MD;  Location: [place];  Service: Neurosurgery;  Laterality: N/A;  Posterior Lateral Fusion - Thoracic Eleven-Thoracic Twelve - Thoracic Twelve-Lumbar One - Lumbar One-Lumbar Two - Lumbar Two-Lumbar Thre  . LIVER BIOPSY N/A 07/06/2019   Procedure: LIVER BIOPSY;  Surgeon: Virl Cagey, MD;  [facility];  Service: General;  Laterality: N/A;  . LUMBAR PERCUTANEOUS PEDICLE SCREW 1 LEVEL N/A 04/05/2017   Procedure: LUMBAR PERCUTANEOUS PEDICLE SCREW LUMBAR THREE-FOUR;  Surgeon: Kary Kos, MD;  Location: [place];  Service: Neurosurgery;  Laterality: N/A;  . ORIF CALCANEOUS FRACTURE Right 09/19/2014   Procedure: OPEN REDUCTION INTERNAL FIXATION (ORIF) CALCANEOUS FRACTURE;  Surgeon: Newt Minion, MD;  Location: [place];  Service: Orthopedics;  Laterality: Right;  . ORIF CALCANEOUS FRACTURE Left 09/19/2014   Procedure: OPEN REDUCTION INTERNAL FIXATION (ORIF) CALCANEOUS FRACTURE;  Surgeon: Newt Minion, MD;  Location: [place];  Service: Orthopedics;  Laterality: Left;  . POLYPECTOMY  06/05/2019   Procedure: POLYPECTOMY;  Surgeon: Rogene Houston, MD;  [facility];  Service: Endoscopy;;  cecal polyp biopsy forcep, ascending polyp cold snare   Social History    Occupational History    Comment: disabled  Tobacco Use  . Smoking status: Former Smoker    Packs/day: 1.00    Years: 10.00    Pack years: 10.00    Types: Cigars    Start date: 10/08/1976    Quit date: 09/2017    Years since quitting: 1.9  . Smokeless tobacco: Never Used  . Tobacco comment: quit smoking cegar 09/10/2018  Substance and Sexual Activity  . Alcohol use: No  Comment: 06/26/16 quit 1998  . Drug use: No  . Sexual activity: Yes    Birth control/protection: None

## 2019-09-01 ENCOUNTER — Telehealth: Payer: Self-pay | Admitting: Orthopedic Surgery

## 2019-09-01 NOTE — Telephone Encounter (Signed)
Patient left a voicemail yesterday wanting to know when he can schedule his surgery with Dr. Sharol Given.  CB#[phone].  Thank you.

## 2019-09-07 ENCOUNTER — Encounter (HOSPITAL_COMMUNITY): Payer: Self-pay | Admitting: *Deleted

## 2019-09-07 ENCOUNTER — Other Ambulatory Visit (HOSPITAL_COMMUNITY)
Admission: RE | Admit: 2019-09-07 | Discharge: 2019-09-07 | Disposition: A | Discharge status: Home / Self Care | Payer: PPO | Source: Ambulatory Visit | Attending: Orthopedic Surgery | Admitting: Orthopedic Surgery

## 2019-09-07 ENCOUNTER — Other Ambulatory Visit: Payer: Self-pay

## 2019-09-07 DIAGNOSIS — Z20828 Contact with and (suspected) exposure to other viral communicable diseases: Secondary | ICD-10-CM | POA: Insufficient documentation

## 2019-09-07 DIAGNOSIS — Z01812 Encounter for preprocedural laboratory examination: Secondary | ICD-10-CM | POA: Diagnosis not present

## 2019-09-07 LAB — SARS CORONAVIRUS 2 (TAT 6-24 HRS): SARS Coronavirus 2: NEGATIVE

## 2019-09-07 NOTE — Progress Notes (Signed)
Pt denies SOB, chest pain, and being under the care of a cardiologist. Pt denies having a stress test, echo and cardiac cath. Pt denies having a chest x ray in the last year. Pt denies recent labs. Pt made aware to stop taking Aspirin (unless otherwise advised by surgeon), vitamins, fish oil and herbal medications. Do not take any NSAIDs ie: Ibuprofen, Advil, Naproxen (Aleve), Motrin, BC and Goody Powder. Pt verbalized understanding of all pre-op instructions.

## 2019-09-08 ENCOUNTER — Other Ambulatory Visit: Payer: Self-pay | Admitting: Physician Assistant

## 2019-09-08 ENCOUNTER — Other Ambulatory Visit: Payer: Self-pay

## 2019-09-09 ENCOUNTER — Ambulatory Visit (HOSPITAL_COMMUNITY): Payer: PPO | Admitting: Certified Registered Nurse Anesthetist

## 2019-09-09 ENCOUNTER — Other Ambulatory Visit: Payer: Self-pay

## 2019-09-09 ENCOUNTER — Encounter (HOSPITAL_COMMUNITY): Payer: Self-pay | Admitting: *Deleted

## 2019-09-09 ENCOUNTER — Encounter (HOSPITAL_COMMUNITY)
Admission: RE | Disposition: A | Discharge status: Home / Self Care | Payer: Self-pay | Source: Home / Self Care | Attending: Orthopedic Surgery

## 2019-09-09 ENCOUNTER — Ambulatory Visit (HOSPITAL_COMMUNITY)
Admission: RE | Admit: 2019-09-09 | Discharge: 2019-09-09 | Disposition: A | Discharge status: Home / Self Care | Payer: PPO | Attending: Orthopedic Surgery | Admitting: Orthopedic Surgery

## 2019-09-09 DIAGNOSIS — Z885 Allergy status to narcotic agent status: Secondary | ICD-10-CM | POA: Diagnosis not present

## 2019-09-09 DIAGNOSIS — M25571 Pain in right ankle and joints of right foot: Secondary | ICD-10-CM | POA: Insufficient documentation

## 2019-09-09 DIAGNOSIS — K219 Gastro-esophageal reflux disease without esophagitis: Secondary | ICD-10-CM | POA: Diagnosis not present

## 2019-09-09 DIAGNOSIS — M960 Pseudarthrosis after fusion or arthrodesis: Secondary | ICD-10-CM | POA: Diagnosis not present

## 2019-09-09 DIAGNOSIS — Z87891 Personal history of nicotine dependence: Secondary | ICD-10-CM | POA: Insufficient documentation

## 2019-09-09 DIAGNOSIS — M1990 Unspecified osteoarthritis, unspecified site: Secondary | ICD-10-CM | POA: Diagnosis not present

## 2019-09-09 DIAGNOSIS — G8918 Other acute postprocedural pain: Secondary | ICD-10-CM | POA: Diagnosis not present

## 2019-09-09 DIAGNOSIS — S92001D Unspecified fracture of right calcaneus, subsequent encounter for fracture with routine healing: Secondary | ICD-10-CM | POA: Diagnosis not present

## 2019-09-09 DIAGNOSIS — Z981 Arthrodesis status: Secondary | ICD-10-CM | POA: Diagnosis not present

## 2019-09-09 DIAGNOSIS — S92002D Unspecified fracture of left calcaneus, subsequent encounter for fracture with routine healing: Secondary | ICD-10-CM

## 2019-09-09 DIAGNOSIS — F418 Other specified anxiety disorders: Secondary | ICD-10-CM | POA: Diagnosis not present

## 2019-09-09 HISTORY — PX: ANKLE FUSION: SHX5718

## 2019-09-09 HISTORY — DX: Presence of dental prosthetic device (complete) (partial): Z97.2

## 2019-09-09 LAB — CBC
HCT: 51.1 % (ref 39.0–52.0)
Hemoglobin: 16.4 g/dL (ref 13.0–17.0)
MCH: 28.7 pg (ref 26.0–34.0)
MCHC: 32.1 g/dL (ref 30.0–36.0)
MCV: 89.3 fL (ref 80.0–100.0)
Platelets: 232 10*3/uL (ref 150–400)
RBC: 5.72 MIL/uL (ref 4.22–5.81)
RDW: 15.8 % — ABNORMAL HIGH (ref 11.5–15.5)
WBC: 6 10*3/uL (ref 4.0–10.5)
nRBC: 0 % (ref 0.0–0.2)

## 2019-09-09 SURGERY — ANKLE FUSION
Anesthesia: General | Site: Ankle | Laterality: Right

## 2019-09-09 MED ORDER — PHENYLEPHRINE 40 MCG/ML (10ML) SYRINGE FOR IV PUSH (FOR BLOOD PRESSURE SUPPORT)
PREFILLED_SYRINGE | INTRAVENOUS | Status: DC | PRN
  Administered 2019-09-09: 80 ug via INTRAVENOUS

## 2019-09-09 MED ORDER — EPHEDRINE 5 MG/ML INJ
INTRAVENOUS | Status: AC
  Filled 2019-09-09: qty 10

## 2019-09-09 MED ORDER — ONDANSETRON HCL 4 MG/2ML IJ SOLN
INTRAMUSCULAR | Status: DC | PRN
  Administered 2019-09-09: 4 mg via INTRAVENOUS

## 2019-09-09 MED ORDER — ROCURONIUM BROMIDE 10 MG/ML (PF) SYRINGE
PREFILLED_SYRINGE | INTRAVENOUS | Status: AC
  Filled 2019-09-09: qty 10

## 2019-09-09 MED ORDER — PHENYLEPHRINE 40 MCG/ML (10ML) SYRINGE FOR IV PUSH (FOR BLOOD PRESSURE SUPPORT)
PREFILLED_SYRINGE | INTRAVENOUS | Status: AC
  Filled 2019-09-09: qty 10

## 2019-09-09 MED ORDER — LACTATED RINGERS IV SOLN
INTRAVENOUS | Status: DC
  Administered 2019-09-09 (×2): via INTRAVENOUS

## 2019-09-09 MED ORDER — 0.9 % SODIUM CHLORIDE (POUR BTL) OPTIME
TOPICAL | Status: DC | PRN
  Administered 2019-09-09: 1000 mL

## 2019-09-09 MED ORDER — DEXAMETHASONE SODIUM PHOSPHATE 10 MG/ML IJ SOLN
INTRAMUSCULAR | Status: DC | PRN
  Administered 2019-09-09: 8 mg via INTRAVENOUS

## 2019-09-09 MED ORDER — FENTANYL CITRATE (PF) 100 MCG/2ML IJ SOLN
INTRAMUSCULAR | Status: AC
  Filled 2019-09-09: qty 2

## 2019-09-09 MED ORDER — FENTANYL CITRATE (PF) 100 MCG/2ML IJ SOLN
100.0000 ug | Freq: Once | INTRAMUSCULAR | Status: AC
  Administered 2019-09-09: 10:00:00 100 ug via INTRAVENOUS

## 2019-09-09 MED ORDER — OXYCODONE-ACETAMINOPHEN 7.5-325 MG PO TABS
1.0000 | ORAL_TABLET | Freq: Once | ORAL | Status: AC
  Administered 2019-09-09: 1 via ORAL

## 2019-09-09 MED ORDER — ONDANSETRON HCL 4 MG/2ML IJ SOLN
INTRAMUSCULAR | Status: AC
  Filled 2019-09-09: qty 2

## 2019-09-09 MED ORDER — CEFAZOLIN SODIUM-DEXTROSE 2-4 GM/100ML-% IV SOLN
2.0000 g | INTRAVENOUS | Status: AC
  Administered 2019-09-09: 2 g via INTRAVENOUS
  Filled 2019-09-09: qty 100

## 2019-09-09 MED ORDER — MIDAZOLAM HCL 2 MG/2ML IJ SOLN
INTRAMUSCULAR | Status: AC
  Administered 2019-09-09: 2 mg via INTRAVENOUS
  Filled 2019-09-09: qty 2

## 2019-09-09 MED ORDER — DEXAMETHASONE SODIUM PHOSPHATE 10 MG/ML IJ SOLN
INTRAMUSCULAR | Status: AC
  Filled 2019-09-09: qty 1

## 2019-09-09 MED ORDER — FENTANYL CITRATE (PF) 100 MCG/2ML IJ SOLN
INTRAMUSCULAR | Status: AC
  Administered 2019-09-09: 100 ug via INTRAVENOUS
  Filled 2019-09-09: qty 2

## 2019-09-09 MED ORDER — LIDOCAINE 2% (20 MG/ML) 5 ML SYRINGE
INTRAMUSCULAR | Status: AC
  Filled 2019-09-09: qty 5

## 2019-09-09 MED ORDER — SUGAMMADEX SODIUM 200 MG/2ML IV SOLN
INTRAVENOUS | Status: DC | PRN
  Administered 2019-09-09: 200 mg via INTRAVENOUS

## 2019-09-09 MED ORDER — PROPOFOL 10 MG/ML IV BOLUS
INTRAVENOUS | Status: DC | PRN
  Administered 2019-09-09: 170 mg via INTRAVENOUS

## 2019-09-09 MED ORDER — FENTANYL CITRATE (PF) 100 MCG/2ML IJ SOLN
25.0000 ug | INTRAMUSCULAR | Status: DC | PRN
  Administered 2019-09-09 (×3): 50 ug via INTRAVENOUS

## 2019-09-09 MED ORDER — ACETAMINOPHEN 10 MG/ML IV SOLN
1000.0000 mg | Freq: Once | INTRAVENOUS | Status: AC
  Administered 2019-09-09: 1000 mg via INTRAVENOUS

## 2019-09-09 MED ORDER — OXYCODONE-ACETAMINOPHEN 7.5-325 MG PO TABS
ORAL_TABLET | ORAL | Status: AC
  Filled 2019-09-09: qty 1

## 2019-09-09 MED ORDER — OXYCODONE-ACETAMINOPHEN 10-325 MG PO TABS: 1.0000 | ORAL_TABLET | ORAL | 0 refills | Status: DC | PRN

## 2019-09-09 MED ORDER — PROPOFOL 10 MG/ML IV BOLUS
INTRAVENOUS | Status: AC
  Filled 2019-09-09: qty 40

## 2019-09-09 MED ORDER — ACETAMINOPHEN 10 MG/ML IV SOLN: 1000.0000 mg | Freq: Four times a day (QID) | INTRAVENOUS | Status: DC

## 2019-09-09 MED ORDER — LIDOCAINE HCL (CARDIAC) PF 100 MG/5ML IV SOSY
PREFILLED_SYRINGE | INTRAVENOUS | Status: DC | PRN
  Administered 2019-09-09: 100 mg via INTRAVENOUS

## 2019-09-09 MED ORDER — MIDAZOLAM HCL 2 MG/2ML IJ SOLN
2.0000 mg | Freq: Once | INTRAMUSCULAR | Status: AC
  Administered 2019-09-09: 10:00:00 2 mg via INTRAVENOUS

## 2019-09-09 MED ORDER — ROCURONIUM BROMIDE 10 MG/ML (PF) SYRINGE
PREFILLED_SYRINGE | INTRAVENOUS | Status: DC | PRN
  Administered 2019-09-09: 50 mg via INTRAVENOUS

## 2019-09-09 MED ORDER — CHLORHEXIDINE GLUCONATE 4 % EX LIQD: 60.0000 mL | Freq: Once | CUTANEOUS | Status: DC

## 2019-09-09 MED ORDER — PHENYLEPHRINE HCL-NACL 10-0.9 MG/250ML-% IV SOLN
INTRAVENOUS | Status: AC
  Filled 2019-09-09: qty 500

## 2019-09-09 SURGICAL SUPPLY — 51 items
BANDAGE ESMARK 6X9 LF (GAUZE/BANDAGES/DRESSINGS) ×1 IMPLANT
BIT DRILL PROFILE 7.0 FT (DRILL) IMPLANT
BLADE AVERAGE 25MMX9MM (BLADE) ×1
BLADE AVERAGE 25X9 (BLADE) ×2 IMPLANT
BLADE SURG 10 STRL SS (BLADE) IMPLANT
BNDG COHESIVE 4X5 TAN STRL (GAUZE/BANDAGES/DRESSINGS) ×3 IMPLANT
BNDG ESMARK 6X9 LF (GAUZE/BANDAGES/DRESSINGS) ×3
BNDG GAUZE ELAST 4 BULKY (GAUZE/BANDAGES/DRESSINGS) ×6 IMPLANT
BUR EGG ELITE 4.0 (BURR) ×1 IMPLANT
BUR EGG ELITE 4.0MM (BURR) ×1
COVER MAYO STAND STRL (DRAPES) IMPLANT
COVER SURGICAL LIGHT HANDLE (MISCELLANEOUS) ×6 IMPLANT
COVER WAND RF STERILE (DRAPES) ×3 IMPLANT
DRAPE INCISE IOBAN 66X45 STRL (DRAPES) ×2 IMPLANT
DRAPE OEC MINIVIEW 54X84 (DRAPES) IMPLANT
DRAPE U-SHAPE 47X51 STRL (DRAPES) ×3 IMPLANT
DRESSING PREVENA PLUS CUSTOM (GAUZE/BANDAGES/DRESSINGS) IMPLANT
DRILL PROFILE 7.0 FT (DRILL) ×3
DRSG ADAPTIC 3X8 NADH LF (GAUZE/BANDAGES/DRESSINGS) ×3 IMPLANT
DRSG PREVENA PLUS CUSTOM (GAUZE/BANDAGES/DRESSINGS) ×3
DURAPREP 26ML APPLICATOR (WOUND CARE) ×3 IMPLANT
ELECT REM PT RETURN 9FT ADLT (ELECTROSURGICAL) ×3
ELECTRODE REM PT RTRN 9FT ADLT (ELECTROSURGICAL) ×1 IMPLANT
GAUZE SPONGE 4X4 12PLY STRL (GAUZE/BANDAGES/DRESSINGS) ×3 IMPLANT
GLOVE BIOGEL PI IND STRL 9 (GLOVE) ×1 IMPLANT
GLOVE BIOGEL PI INDICATOR 9 (GLOVE) ×2
GLOVE SURG ORTHO 9.0 STRL STRW (GLOVE) ×3 IMPLANT
GOWN STRL REUS W/ TWL LRG LVL3 (GOWN DISPOSABLE) ×1 IMPLANT
GOWN STRL REUS W/ TWL XL LVL3 (GOWN DISPOSABLE) ×1 IMPLANT
GOWN STRL REUS W/TWL LRG LVL3 (GOWN DISPOSABLE) ×2
GOWN STRL REUS W/TWL XL LVL3 (GOWN DISPOSABLE) ×2
GUIDEWIRE W/TRCR TIP 2.4X9.25 (WIRE) ×4 IMPLANT
KIT BASIN OR (CUSTOM PROCEDURE TRAY) ×3 IMPLANT
KIT DRSG PREVENA PLUS 7DAY 125 (MISCELLANEOUS) ×2 IMPLANT
KIT TURNOVER KIT B (KITS) ×3 IMPLANT
NS IRRIG 1000ML POUR BTL (IV SOLUTION) ×3 IMPLANT
PACK ORTHO EXTREMITY (CUSTOM PROCEDURE TRAY) ×3 IMPLANT
PAD ARMBOARD 7.5X6 YLW CONV (MISCELLANEOUS) ×6 IMPLANT
PUTTY BONE DBX 5CC MIX (Putty) ×4 IMPLANT
SCREW COMPR FT 7X55 (Screw) ×2 IMPLANT
SCREW COMPR FT 7X65 (Screw) ×2 IMPLANT
SCREW COMPR FT 7X70 (Screw) ×2 IMPLANT
SPONGE LAP 18X18 RF (DISPOSABLE) ×3 IMPLANT
SUCTION FRAZIER HANDLE 10FR (MISCELLANEOUS) ×4
SUCTION TUBE FRAZIER 10FR DISP (MISCELLANEOUS) ×1 IMPLANT
SUT ETHILON 2 0 PSLX (SUTURE) ×9 IMPLANT
TOWEL GREEN STERILE (TOWEL DISPOSABLE) ×3 IMPLANT
TOWEL GREEN STERILE FF (TOWEL DISPOSABLE) ×3 IMPLANT
TUBE CONNECTING 12'X1/4 (SUCTIONS) ×1
TUBE CONNECTING 12X1/4 (SUCTIONS) ×2 IMPLANT
WATER STERILE IRR 1000ML POUR (IV SOLUTION) ×3 IMPLANT

## 2019-09-09 NOTE — Anesthesia Postprocedure Evaluation (Signed)
Anesthesia Post Note  Patient: Bradley Espinoza  Procedure(s) Performed: REVISION RIGHT ANKLE FUSION (Right Ankle)     Anesthesia Post Evaluation  Last Vitals:  Vitals:   09/09/19 1233 09/09/19 1245  BP: (!) 145/85 (!) 144/75  Pulse: (!) 49 (!) 53  Resp: 18 18  Temp:  36.6 C  SpO2: 99% 98%    Last Pain:  Vitals:   09/09/19 1217  TempSrc:   PainSc: 5                  Fidela Cieslak

## 2019-09-09 NOTE — Anesthesia Procedure Notes (Addendum)
Anesthesia Regional Block: Popliteal block   Pre-Anesthetic Checklist: ,, timeout performed, Correct Patient, Correct Site, Correct Laterality, Correct Procedure, Correct Position, site marked, Risks and benefits discussed,  Surgical consent,  Pre-op evaluation,  At surgeon's request and post-op pain management  Laterality: Right  Prep: chloraprep       Needles:  Injection technique: Single-shot  Needle Type: Echogenic Stimulator Needle          Additional Needles:   Narrative:  Start time: 09/09/2019 10:10 AM End time: 09/09/2019 10:25 AM Injection made incrementally with aspirations every 5 mL.  Performed by: Personally  Anesthesiologist: Belinda Block, MD

## 2019-09-09 NOTE — Transfer of Care (Signed)
Immediate Anesthesia Transfer of Care Note  Patient: Bradley Espinoza  Procedure(s) Performed: REVISION RIGHT ANKLE FUSION (Right Ankle)  Patient Location: PACU  Anesthesia Type:General  Level of Consciousness: awake, alert  and oriented  Airway & Oxygen Therapy: Patient Spontanous Breathing and Patient connected to nasal cannula oxygen  Post-op Assessment: Report given to RN and Post -op Vital signs reviewed and stable  Post vital signs: Reviewed and stable  Last Vitals:  Vitals Value Taken Time  BP    Temp    Pulse 71 09/09/19 1146  Resp 16 09/09/19 1146  SpO2 100 % 09/09/19 1146  Vitals shown include unvalidated device data.  Last Pain:  Vitals:   09/09/19 1025  TempSrc:   PainSc: 4       Patients Stated Pain Goal: 3 ([id number])  Complications: No apparent anesthesia complications

## 2019-09-09 NOTE — Op Note (Signed)
09/09/2019  11:51 AM  PATIENT:  Bradley Espinoza    PRE-OPERATIVE DIAGNOSIS:  Fibrous nonUnion Right Ankle Fusion  POST-OPERATIVE DIAGNOSIS:  Same  PROCEDURE:  REVISION RIGHT ANKLE FUSION with tibial calcaneal fusion. Application of Praveena customizable wound VAC.  SURGEON:  Newt Minion, MD  PHYSICIAN ASSISTANT:None ANESTHESIA:   April Green  PREOPERATIVE INDICATIONS:  [name] is a  63 y.o. male with a diagnosis of Fibrous Union Right Ankle Fusion who failed conservative measures and elected for surgical management.    The risks benefits and alternatives were discussed with the patient preoperatively including but not limited to the risks of infection, bleeding, nerve injury, cardiopulmonary complications, the need for revision surgery, among others, and the patient was willing to proceed.  OPERATIVE IMPLANTS: Arthrex conical compression screws x3 with customizable Prevena wound VAC.  @ENCIMAGES @  OPERATIVE FINDINGS: Patient had a fibrous nonunion no clinical signs of infection no fluid.  OPERATIVE PROCEDURE: Patient was brought to the operating room and underwent a general anesthetic after a popliteal block.  After adequate levels anesthesia were obtained patient's right lower extremity was prepped using DuraPrep draped into a sterile field a timeout was called.  An incision was made medially over the medial malleolus.  Partial excision of the medial malleolus was performed and using C-arm for guidance the fibrous nonunion of the tibiotalar joint was taken down with both a bur debrider as well as curettes and a rondure.  C-arm fluoroscopy verified complete debridement of the fibrous nonunion site.  This was then packed with demineralized bone matrix putty.  The ankle is reduced at 90 degrees and 3 guidewires were used to stabilize the tibiotalar joint to the calcaneus.  2 conical screws were placed medially through the tibia into the calcaneus through the talus and one  lateral screw was placed from the tibia to the talus into the calcaneus.  See arthroscopy verified alignment.  Prior to compression screws the tibiotalar joint was packed with 15 cc of the demineralized bone matrix.  C-arm fluoroscopy verified alignment the incisions were closed using 2-0 nylon a Praveena wound VAC was applied this had a good suction fit patient was extubated taken the PACU in stable condition.   DISCHARGE PLANNING:  Antibiotic duration: Preoperative antibiotics  Weightbearing: Touchdown weightbearing on the right  Pain medication: Prescription for Percocet  Dressing care/ Wound VAC: Continue wound VAC at discharge for 1 week  Ambulatory devices: Walker or crutches or wheelchair  Discharge to: Home.  Follow-up: In the office 1 week post operative.

## 2019-09-09 NOTE — Anesthesia Procedure Notes (Signed)
Procedure Name: Intubation Date/Time: 09/09/2019 10:43 AM Performed by: Raenette Rover, CRNA Pre-anesthesia Checklist: Patient identified, Emergency Drugs available, Suction available and Patient being monitored Patient Re-evaluated:Patient Re-evaluated prior to induction Oxygen Delivery Method: Circle system utilized Preoxygenation: Pre-oxygenation with 100% oxygen Induction Type: IV induction Ventilation: Mask ventilation without difficulty Laryngoscope Size: Mac and 3 Grade View: Grade I Tube type: Oral Tube size: 7.0 mm Number of attempts: 1 Airway Equipment and Method: Stylet Placement Confirmation: positive ETCO2,  ETT inserted through vocal cords under direct vision and breath sounds checked- equal and bilateral Secured at: 22 cm Tube secured with: Tape Dental Injury: Teeth and Oropharynx as per pre-operative assessment

## 2019-09-09 NOTE — Anesthesia Preprocedure Evaluation (Addendum)
Anesthesia Evaluation  Patient identified by MRN, date of birth, ID band Patient awake    Reviewed: Allergy & Precautions, NPO status , Patient's Chart, lab work & pertinent test results  Airway Mallampati: II  TM Distance: >3 FB     Dental   Pulmonary former smoker,    breath sounds clear to auscultation       Cardiovascular negative cardio ROS   Rhythm:Regular Rate:Normal     Neuro/Psych  Headaches, PSYCHIATRIC DISORDERS Anxiety Depression    GI/Hepatic Neg liver ROS, GERD  ,  Endo/Other  negative endocrine ROS  Renal/GU negative Renal ROS     Musculoskeletal   Abdominal   Peds  Hematology  (+) anemia ,   Anesthesia Other Findings   Reproductive/Obstetrics                            Anesthesia Physical Anesthesia Plan  ASA: III  Anesthesia Plan: General   Post-op Pain Management:  Regional for Post-op pain   Induction: Intravenous  PONV Risk Score and Plan: 2 and Ondansetron  Airway Management Planned: Oral ETT  Additional Equipment:   Intra-op Plan:   Post-operative Plan: Extubation in OR  Informed Consent: I have reviewed the patients History and Physical, chart, labs and discussed the procedure including the risks, benefits and alternatives for the proposed anesthesia with the patient or authorized representative who has indicated his/her understanding and acceptance.     Dental advisory given  Plan Discussed with: CRNA and Anesthesiologist  Anesthesia Plan Comments:         Anesthesia Quick Evaluation

## 2019-09-09 NOTE — H&P (Signed)
Bradley Espinoza is an 63 y.o. male.   Chief Complaint: Right Ankle Pain HPI:  Patient is a 63 year old gentleman who presents in follow-up for both lower extremities.  He has had open calcaneus fracture on the right and is undergone multiple surgical interventions for limb salvage.  Patient has a fibrous tibiotalar union and despite wearing a shoe lift and double upright braces he still has pain with activities of daily living.  He states it wakes him up at night he complains of stinging and burning pain radiating around the ankle.  Patient states he can no longer wait to see if this will resolve on its own.  Patient states he has stopped smoking. Past Medical History:  Diagnosis Date  . Anxiety   . Arthritis    low back pain, lumbar radiculopathy  . Depression   . Fracture    B/L ankles  . GERD (gastroesophageal reflux disease)   . Headache(784.0)    allergy related   . History of kidney stones   . History of stomach ulcers   . Retained orthopedic hardware    failed retained hardware right foot  . Wears glasses   . Wears glasses   . Wears partial dentures     Past Surgical History:  Procedure Laterality Date  . ANKLE FUSION Right 05/11/2015   Procedure: Right Posterior Arthroscopic Subtalar Arthrodesis;  Surgeon: Newt Minion, MD;  Location: [place];  Service: Orthopedics;  Laterality: Right;  . ANKLE FUSION Right 11/27/2017   Procedure: RIGHT TIBIOCALCANEAL FUSION;  Surgeon: Newt Minion, MD;  Location: [place];  Service: Orthopedics;  Laterality: Right;  . ANKLE FUSION Right 11/26/2018   Procedure: RIGHT ANTERIOR ANKLE FUSION, APPLY VAC;  Surgeon: Newt Minion, MD;  Location: [place];  Service: Orthopedics;  Laterality: Right;  . ANTERIOR LAT LUMBAR FUSION N/A 04/05/2017   Procedure: Extreme Lateral Interbody Fusion - Lumbar three-lumbar four ,exploration of fusion Posterior augmentation with globus addition Removal hardware Lumbar one-three. Lumbar four-sacral one,  Removal  internal bone growth stimulator;  Surgeon: Kary Kos, MD;  Location: [place];  Service: Neurosurgery;  Laterality: N/A;  . APPLICATION OF WOUND VAC  12/23/2018   Procedure: Application Of Wound Vac;  Surgeon: Newt Minion, MD;  Location: [place];  Service: Orthopedics;;  . BACK SURGERY  2004   x 2  . BIOPSY  06/05/2019   Procedure: BIOPSY;  Surgeon: Rogene Houston, MD;  [facility];  Service: Endoscopy;;  gastric biopsy  . CERVICAL SPINE SURGERY  2008  . CHOLECYSTECTOMY N/A 07/06/2019   Procedure: LAPAROSCOPIC CHOLECYSTECTOMY;  Surgeon: Virl Cagey, MD;  [facility];  Service: General;  Laterality: N/A;  . COLONOSCOPY WITH PROPOFOL N/A 06/05/2019   Procedure: COLONOSCOPY WITH PROPOFOL;  Surgeon: Rogene Houston, MD;  [facility];  Service: Endoscopy;  Laterality: N/A;  . ESOPHAGOGASTRODUODENOSCOPY    . ESOPHAGOGASTRODUODENOSCOPY (EGD) WITH PROPOFOL N/A 06/05/2019   Procedure: ESOPHAGOGASTRODUODENOSCOPY (EGD) WITH PROPOFOL;  Surgeon: Rogene Houston, MD;  [facility];  Service: Endoscopy;  Laterality: N/A;  1220pm  . HARDWARE REMOVAL Right 10/09/2014   Procedure: Removal Deep Hardware, Irrigation and Debridement Calcaneus, Place Antibiotic Beads and Wound VAC ;  Surgeon: Newt Minion, MD;  Location: [place];  Service: Orthopedics;  Laterality: Right;  . HARDWARE REMOVAL Right 08/12/2015   Procedure: Removal Deep Hardware Right Foot;  Surgeon: Newt Minion, MD;  Location: [place];  Service: Orthopedics;  Laterality:  Right;  Marland Kitchen HARDWARE REMOVAL Right 11/26/2018   Procedure: REMOVAL RIGHT TIBIOCALCANEAL NAIL;  Surgeon: Newt Minion, MD;  Location: [place];  Service: Orthopedics;  Laterality: Right;  . HARDWARE REMOVAL Right 12/23/2018   Procedure: RIGHT ANKLE REMOVE HARDWARE, PLACE ANTIBIOTIC BEADS and placement of wound vac;  Surgeon: Newt Minion, MD;  Location: [place];  Service: Orthopedics;  Laterality: Right;  . I&D EXTREMITY Right 09/15/2014    Procedure: IRRIGATION AND DEBRIDEMENT Ankle;  Surgeon: Renette Butters, MD;  Location: [place];  Service: Orthopedics;  Laterality: Right;  . INGUINAL HERNIA REPAIR Bilateral   . LAMINECTOMY WITH POSTERIOR LATERAL ARTHRODESIS LEVEL 4 N/A 09/10/2018   Procedure: Posterior Lateral Fusion - Thoracic Eleven-Thoracic Twelve - Thoracic Twelve-Lumbar One - Lumbar One-Lumbar Two - Lumbar Two-Lumbar Three with instrumentaion and PLA;  Surgeon: Kary Kos, MD;  Location: [place];  Service: Neurosurgery;  Laterality: N/A;  Posterior Lateral Fusion - Thoracic Eleven-Thoracic Twelve - Thoracic Twelve-Lumbar One - Lumbar One-Lumbar Two - Lumbar Two-Lumbar Thre  . LIVER BIOPSY N/A 07/06/2019   Procedure: LIVER BIOPSY;  Surgeon: Virl Cagey, MD;  [facility];  Service: General;  Laterality: N/A;  . LUMBAR PERCUTANEOUS PEDICLE SCREW 1 LEVEL N/A 04/05/2017   Procedure: LUMBAR PERCUTANEOUS PEDICLE SCREW LUMBAR THREE-FOUR;  Surgeon: Kary Kos, MD;  Location: [place];  Service: Neurosurgery;  Laterality: N/A;  . ORIF CALCANEOUS FRACTURE Right 09/19/2014   Procedure: OPEN REDUCTION INTERNAL FIXATION (ORIF) CALCANEOUS FRACTURE;  Surgeon: Newt Minion, MD;  Location: [place];  Service: Orthopedics;  Laterality: Right;  . ORIF CALCANEOUS FRACTURE Left 09/19/2014   Procedure: OPEN REDUCTION INTERNAL FIXATION (ORIF) CALCANEOUS FRACTURE;  Surgeon: Newt Minion, MD;  Location: [place];  Service: Orthopedics;  Laterality: Left;  . POLYPECTOMY  06/05/2019   Procedure: POLYPECTOMY;  Surgeon: Rogene Houston, MD;  [facility];  Service: Endoscopy;;  cecal polyp biopsy forcep, ascending polyp cold snare    Family History  Problem Relation Age of Onset  . Diabetes Father   . Cancer Other    Social History:  reports that he quit smoking about 2 years ago. His smoking use included cigars. He started smoking about 42 years ago. He has a 10.00 pack-year smoking history. He has never used smokeless tobacco. He reports  that he does not drink alcohol or use drugs.  Allergies:  Allergies  Allergen Reactions  . Codeine Nausea And Vomiting    No medications prior to admission.    Results for orders placed or performed during the hospital encounter of 09/07/19 (from the past 48 hour(s))  SARS CORONAVIRUS 2 (TAT 6-24 HRS)     Status: None   Collection Time: 09/07/19  3:59 PM  Result Value Ref Range   SARS Coronavirus 2 NEGATIVE NEGATIVE    Comment: (NOTE) SARS-CoV-2 target nucleic acids are NOT DETECTED. The SARS-CoV-2 RNA is generally detectable in upper and lower respiratory specimens during the acute phase of infection. Negative results do not preclude SARS-CoV-2 infection, do not rule out co-infections with other pathogens, and should not be used as the sole basis for treatment or other patient management decisions. Negative results must be combined with clinical observations, patient history, and epidemiological information. The expected result is Negative. Fact Sheet for Patients: SugarRoll.be Fact Sheet for Healthcare Providers: https://www.woods-mathews.com/ This test is not yet approved or cleared by the [place] FDA and  has been authorized for detection and/or diagnosis of SARS-CoV-2 by FDA under an Emergency  Use Authorization (EUA). This EUA will remain  in effect (meaning this test can be used) for the duration of the COVID-19 declaration under Section 56 4(b)(1) of the Act, 21 U.S.C. section 360bbb-3(b)(1), unless the authorization is terminated or revoked sooner. Performed at [facility], [place] [address], [place], [place] [address]    No results found.  Review of Systems  All other systems reviewed and are negative.   There were no vitals taken for this visit. Physical Exam  Patient is alert, oriented, no adenopathy, well-dressed, normal affect, normal respiratory effort. Examination there is no redness no swelling no  cellulitis.  He has a anterior incision over the tibia.  His dorsalis pedis pulses intact he has good sensation in the superficial and deep peroneal nerve distribution.  Radiographs shows a fibrous union across the tibiotalar joint.  Assessment/Plan Right Ankle Fusion Non Union Plan: Discussed that the best option to relieve pain would be a transtibial amputation.  Patient states that he is asked for this in the past but he feels like after 2 days after surgery that he would regret his decision.  Patient states he wants to proceed with limb salvage.  Discussed that we could proceed with a lateral incision for debridement of the fibrous union packed this with demineralized bone graft and placed him in a short leg cast.  Discussed that even without using hardware he is still an increased risk of recurrent infection and increased risk of the fibrous union with persistent pain.  Patient states he understands and wished to proceed with surgery as soon as possible.  Bradley Palmer Bethel Gaglio, PA 09/09/2019, 7:18 AM

## 2019-09-10 ENCOUNTER — Telehealth: Payer: Self-pay | Admitting: Orthopedic Surgery

## 2019-09-10 NOTE — Telephone Encounter (Signed)
IC patient to schedule his post op appointment.  He wanted to know what he is suppose to do if the wound vac gets full and starts beeping.  CB#[phone].  Thank you.

## 2019-09-10 NOTE — Telephone Encounter (Signed)
Patient was called and advised that he ca call the office and make a appt if needed for his wound vac malfunction. I advised patient if his wound vac would start beeping then to unplug it and plug it back to see if it will stop. Patient understood to call the office and someone here will be able to help with issues (on call ) after hours if needed.

## 2019-09-11 ENCOUNTER — Encounter (HOSPITAL_COMMUNITY): Payer: Self-pay | Admitting: Orthopedic Surgery

## 2019-09-14 ENCOUNTER — Other Ambulatory Visit: Payer: Self-pay | Admitting: Physician Assistant

## 2019-09-14 ENCOUNTER — Telehealth: Payer: Self-pay | Admitting: Orthopedic Surgery

## 2019-09-14 MED ORDER — OXYCODONE-ACETAMINOPHEN 10-325 MG PO TABS: 1.0000 | ORAL_TABLET | ORAL | 0 refills | Status: DC | PRN

## 2019-09-14 NOTE — Telephone Encounter (Signed)
I will refill

## 2019-09-14 NOTE — Telephone Encounter (Signed)
Patient left a voicemail message requesting an RX refill on his Oxycodone.  CB#[phone].  Thank you.

## 2019-09-14 NOTE — Telephone Encounter (Signed)
Bradley Espinoza please advise, thank you.

## 2019-09-15 ENCOUNTER — Telehealth: Payer: Self-pay | Admitting: Physician Assistant

## 2019-09-15 NOTE — Telephone Encounter (Signed)
Patient called advised he is completely out of his pain medicine. The number to contact patient is [phone]

## 2019-09-15 NOTE — Telephone Encounter (Signed)
09/09/19 revision right ankle fusion requesting refill on pain medication. Just received oxycodone 10/325 # 30 yesterday

## 2019-09-16 NOTE — Telephone Encounter (Signed)
Too soon for refill.

## 2019-09-16 NOTE — Telephone Encounter (Signed)
I called pt and he did not get rx written on 09/14/19. I called [facility] and they advised that the rx had been put on file because it was too soon to fill. Pt had gotten #120 percocet 10/325 from pain management and that insurance will not cover this. Advised that the pt just had surgery and it is ok to fill our rx the pt will just need to pay out of pocket for this which would be $27.20 or wait until 09/21/19 which would be the next fill date.  called pt to advise and he voiced understanding will go and pick up rx today.

## 2019-09-17 ENCOUNTER — Encounter: Payer: Self-pay | Admitting: Orthopedic Surgery

## 2019-09-17 ENCOUNTER — Ambulatory Visit (INDEPENDENT_AMBULATORY_CARE_PROVIDER_SITE_OTHER): Payer: PPO | Admitting: Orthopedic Surgery

## 2019-09-17 ENCOUNTER — Other Ambulatory Visit: Payer: Self-pay

## 2019-09-17 VITALS — Ht 72.0 in | Wt 170.0 lb

## 2019-09-17 DIAGNOSIS — Z981 Arthrodesis status: Secondary | ICD-10-CM

## 2019-09-18 ENCOUNTER — Encounter: Payer: Self-pay | Admitting: Orthopedic Surgery

## 2019-09-18 NOTE — Progress Notes (Signed)
Office Visit Note   Patient: Bradley Espinoza           Date of Birth: Apr 29, 1956           MRN: [id number] Visit Date: 09/17/2019              Requested by: Celene Squibb, MD [place],  [place] [address] PCP: Celene Squibb, MD  Chief Complaint  Patient presents with  . Right Ankle - Routine Post Op    09/09/19 right ankle fusion       HPI: Patient is a 63 year old gentleman who presents 1 week status post revision right ankle fusion with takedown of the fibrous nonunion.  Patient is currently nonweightbearing in a fracture boot.  Patient states he took the wound VAC off himself at home yesterday.  Assessment & Plan: Visit Diagnoses:  1. S/P ankle fusion     Plan: Discussed the importance of nonweightbearing elevation and modified the boot to take the pressure off the incision recommended dry dressing changes daily.  At follow-up obtain three-view radiographs of the right ankle.  Discussed that once the drainage completely stops we can place him in a short leg cast.  Follow-Up Instructions: Return in about 1 week (around 09/24/2019).   Ortho Exam  Patient is alert, oriented, no adenopathy, well-dressed, normal affect, normal respiratory effort. Examination patient is bloated the incision with his hands the wound is gaped open a millimeter there is some maceration around the wound edges from the wound VAC being turned off.  There is some clear serosanguineous drainage there is no cellulitis.  Imaging: No results found. No images are attached to the encounter.  Labs: Lab Results  Component Value Date   HGBA1C 5.1 06/26/2016   ESRSEDRATE 9 12/08/2018   REPTSTATUS 12/28/2018 FINAL 12/23/2018   GRAMSTAIN  12/23/2018    FEW WBC PRESENT,BOTH PMN AND MONONUCLEAR RARE GRAM POSITIVE COCCI Performed at [facility], [address] [place], [place] [address]    CULT  12/23/2018    FEW STAPHYLOCOCCUS AUREUS NO ANAEROBES ISOLATED; CULTURE IN PROGRESS  FOR 5 DAYS    LABORGA STAPHYLOCOCCUS AUREUS 12/23/2018     Lab Results  Component Value Date   ALBUMIN 4.4 07/02/2019   ALBUMIN 4.3 08/12/2015   ALBUMIN 4.1 05/06/2015    No results found for: MG No results found for: VD25OH  No results found for: PREALBUMIN CBC EXTENDED Latest Ref Rng & Units 09/09/2019 07/02/2019 12/19/2018  WBC 4.0 - 10.5 K/uL 6.0 8.0 6.9  RBC 4.22 - 5.81 MIL/uL 5.72 5.22 4.03(L)  HGB 13.0 - 17.0 g/dL 16.4 14.8 11.8(L)  HCT 39.0 - 52.0 % 51.1 47.8 36.5(L)  PLT 150 - 400 K/uL 232 244 214  NEUTROABS 1.7 - 7.7 K/uL - 5.7 -  LYMPHSABS 0.7 - 4.0 K/uL - 1.6 -     Body mass index is 23.06 kg/m.  Orders:  No orders of the defined types were placed in this encounter.  No orders of the defined types were placed in this encounter.    Procedures: No procedures performed  Clinical Data: No additional findings.  ROS:  All other systems negative, except as noted in the HPI. Review of Systems  Objective: Vital Signs: Ht 6' (1.829 m)   Wt 170 lb (77.1 kg)   BMI 23.06 kg/m   Specialty Comments:  No specialty comments available.  PMFS History: Patient Active Problem List   Diagnosis Date Noted  . Fatty  liver 06/30/2019  . Calculus of gallbladder without cholecystitis without obstruction 06/30/2019  . Nausea without vomiting 05/13/2019  . Abdominal bloating 05/13/2019  . Wound infection following procedure 12/19/2018  . Malunion of joint fusion ([place]) 11/26/2018  . 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[facility])   . Status post lumbar spinal fusion 09/10/2018  . S/P ankle fusion 11/27/2017  . Nonunion of subtalar arthrodesis   . Avascular necrosis of talus, right ([place])   . Post-traumatic osteoarthritis, left ankle and foot 10/22/2017  . DDD (degenerative disc disease), lumbar 04/05/2017  . Fracture of L2 vertebra ([place]) 01/12/2015  . Acute osteomyelitis of calcaneum ([place]) 10/09/2014  . Dysuria 10/05/2014  . Cellulitis 10/05/2014  . Fall from ladder 09/21/2014  . L2  vertebral fracture ([place]) 09/21/2014  . Bilateral calcaneal fractures 09/21/2014  . Chronic pain 09/21/2014  . Acute blood loss anemia 09/21/2014  . Open fracture of both calcanei 09/15/2014   Past Medical History:  Diagnosis Date  . Anxiety   . Arthritis    low back pain, lumbar radiculopathy  . Depression   . Fracture    B/L ankles  . GERD (gastroesophageal reflux disease)   . Headache(784.0)    allergy related   . History of kidney stones   . History of stomach ulcers   . Retained orthopedic hardware    failed retained hardware right foot  . Wears glasses   . Wears glasses   . Wears partial dentures     Family History  Problem Relation Age of Onset  . Diabetes Father   . Cancer Other     Past Surgical History:  Procedure Laterality Date  . ANKLE FUSION Right 05/11/2015   Procedure: Right Posterior Arthroscopic Subtalar Arthrodesis;  Surgeon: Newt Minion, MD;  Location: [facility];  Service: Orthopedics;  Laterality: Right;  . ANKLE FUSION Right 11/27/2017   Procedure: RIGHT TIBIOCALCANEAL FUSION;  Surgeon: Newt Minion, MD;  Location: [facility];  Service: Orthopedics;  Laterality: Right;  . ANKLE FUSION Right 11/26/2018   Procedure: RIGHT ANTERIOR ANKLE FUSION, APPLY VAC;  Surgeon: Newt Minion, MD;  Location: [facility];  Service: Orthopedics;  Laterality: Right;  . ANKLE FUSION Right 09/09/2019   Procedure: REVISION RIGHT ANKLE FUSION;  Surgeon: Newt Minion, MD;  Location: [facility];  Service: Orthopedics;  Laterality: Right;  . ANTERIOR LAT LUMBAR FUSION N/A 04/05/2017   Procedure: Extreme Lateral Interbody Fusion - Lumbar three-lumbar four ,exploration of fusion Posterior augmentation with globus addition Removal hardware Lumbar one-three. Lumbar four-sacral one,  Removal internal bone growth stimulator;  Surgeon: Kary Kos, MD;  Location: [facility];  Service: Neurosurgery;  Laterality: N/A;  . APPLICATION OF WOUND VAC  12/23/2018   Procedure: Application Of Wound Vac;  Surgeon: Newt Minion, MD;  Location: [facility];  Service: Orthopedics;;  . BACK SURGERY  2004   x 2  . BIOPSY  06/05/2019   Procedure: BIOPSY;  Surgeon: Rogene Houston, MD;  [facility];  Service: Endoscopy;;  gastric biopsy  . CERVICAL SPINE SURGERY  2008  . CHOLECYSTECTOMY N/A 07/06/2019   Procedure: LAPAROSCOPIC CHOLECYSTECTOMY;  Surgeon: Virl Cagey, MD;  [facility];  Service: General;  Laterality: N/A;  . COLONOSCOPY WITH PROPOFOL N/A 06/05/2019   Procedure: COLONOSCOPY WITH PROPOFOL;  Surgeon: Rogene Houston, MD;  [facility];  Service: Endoscopy;  Laterality: N/A;  . ESOPHAGOGASTRODUODENOSCOPY    . ESOPHAGOGASTRODUODENOSCOPY (EGD) WITH PROPOFOL N/A 06/05/2019   Procedure: ESOPHAGOGASTRODUODENOSCOPY (EGD) WITH  PROPOFOL;  Surgeon: Rogene Houston, MD;  [facility];  Service: Endoscopy;  Laterality: N/A;  1220pm  . HARDWARE REMOVAL Right 10/09/2014   Procedure: Removal Deep Hardware, Irrigation and Debridement Calcaneus, Place Antibiotic Beads and Wound VAC ;  Surgeon: Newt Minion, MD;  Location: [facility];  Service: Orthopedics;  Laterality: Right;  . HARDWARE REMOVAL Right 08/12/2015   Procedure: Removal Deep Hardware Right Foot;  Surgeon: Newt Minion, MD;  Location: [facility];  Service: Orthopedics;  Laterality: Right;  . HARDWARE REMOVAL Right 11/26/2018   Procedure: REMOVAL RIGHT TIBIOCALCANEAL NAIL;  Surgeon: Newt Minion, MD;  Location: [facility];  Service: Orthopedics;  Laterality: Right;  . HARDWARE REMOVAL Right 12/23/2018   Procedure: RIGHT ANKLE REMOVE HARDWARE, PLACE ANTIBIOTIC BEADS and placement of wound vac;  Surgeon: Newt Minion, MD;  Location: [facility];  Service: Orthopedics;  Laterality: Right;  . I&D EXTREMITY Right 09/15/2014   Procedure: IRRIGATION AND DEBRIDEMENT Ankle;  Surgeon: Renette Butters, MD;  Location: [facility];  Service: Orthopedics;  Laterality: Right;  . INGUINAL HERNIA REPAIR Bilateral   . LAMINECTOMY WITH POSTERIOR LATERAL  ARTHRODESIS LEVEL 4 N/A 09/10/2018   Procedure: Posterior Lateral Fusion - Thoracic Eleven-Thoracic Twelve - Thoracic Twelve-Lumbar One - Lumbar One-Lumbar Two - Lumbar Two-Lumbar Three with instrumentaion and PLA;  Surgeon: Kary Kos, MD;  Location: [facility];  Service: Neurosurgery;  Laterality: N/A;  Posterior Lateral Fusion - Thoracic Eleven-Thoracic Twelve - Thoracic Twelve-Lumbar One - Lumbar One-Lumbar Two - Lumbar Two-Lumbar Thre  . LIVER BIOPSY N/A 07/06/2019   Procedure: LIVER BIOPSY;  Surgeon: Virl Cagey, MD;  [facility];  Service: General;  Laterality: N/A;  . LUMBAR PERCUTANEOUS PEDICLE SCREW 1 LEVEL N/A 04/05/2017   Procedure: LUMBAR PERCUTANEOUS PEDICLE SCREW LUMBAR THREE-FOUR;  Surgeon: Kary Kos, MD;  Location: [facility];  Service: Neurosurgery;  Laterality: N/A;  . ORIF CALCANEOUS FRACTURE Right 09/19/2014   Procedure: OPEN REDUCTION INTERNAL FIXATION (ORIF) CALCANEOUS FRACTURE;  Surgeon: Newt Minion, MD;  Location: [facility];  Service: Orthopedics;  Laterality: Right;  . ORIF CALCANEOUS FRACTURE Left 09/19/2014   Procedure: OPEN REDUCTION INTERNAL FIXATION (ORIF) CALCANEOUS FRACTURE;  Surgeon: Newt Minion, MD;  Location: [facility];  Service: Orthopedics;  Laterality: Left;  . POLYPECTOMY  06/05/2019   Procedure: POLYPECTOMY;  Surgeon: Rogene Houston, MD;  [facility];  Service: Endoscopy;;  cecal polyp biopsy forcep, ascending polyp cold snare   Social History   Occupational History    Comment: disabled  Tobacco Use  . Smoking status: Former Smoker    Packs/day: 1.00    Years: 10.00    Pack years: 10.00    Types: Cigars    Start date: 10/08/1976    Quit date: 09/2017    Years since quitting: 2.0  . Smokeless tobacco: Never Used  . Tobacco comment: quit smoking cegar 09/10/2018  Substance and Sexual Activity  . Alcohol use: No    Comment:  quit 1998  . Drug use: No  . Sexual activity: Yes    Birth control/protection: None

## 2019-09-24 ENCOUNTER — Encounter: Payer: Self-pay | Admitting: Orthopedic Surgery

## 2019-09-24 ENCOUNTER — Ambulatory Visit (INDEPENDENT_AMBULATORY_CARE_PROVIDER_SITE_OTHER): Payer: PPO | Admitting: Orthopedic Surgery

## 2019-09-24 ENCOUNTER — Other Ambulatory Visit: Payer: Self-pay

## 2019-09-24 ENCOUNTER — Ambulatory Visit (INDEPENDENT_AMBULATORY_CARE_PROVIDER_SITE_OTHER): Payer: PPO

## 2019-09-24 VITALS — Ht 72.0 in | Wt 170.0 lb

## 2019-09-24 DIAGNOSIS — Z981 Arthrodesis status: Secondary | ICD-10-CM

## 2019-09-24 DIAGNOSIS — R030 Elevated blood-pressure reading, without diagnosis of hypertension: Secondary | ICD-10-CM | POA: Diagnosis not present

## 2019-09-24 DIAGNOSIS — S81801D Unspecified open wound, right lower leg, subsequent encounter: Secondary | ICD-10-CM | POA: Diagnosis not present

## 2019-09-24 DIAGNOSIS — M5440 Lumbago with sciatica, unspecified side: Secondary | ICD-10-CM | POA: Diagnosis not present

## 2019-09-24 DIAGNOSIS — M47816 Spondylosis without myelopathy or radiculopathy, lumbar region: Secondary | ICD-10-CM | POA: Diagnosis not present

## 2019-09-24 MED ORDER — LEVOFLOXACIN 750 MG PO TABS: 750.0000 mg | ORAL_TABLET | Freq: Every day | ORAL | 0 refills | Status: DC

## 2019-09-24 MED ORDER — KETOROLAC TROMETHAMINE 10 MG PO TABS: 10.0000 mg | ORAL_TABLET | Freq: Four times a day (QID) | ORAL | 0 refills | Status: DC | PRN

## 2019-09-25 ENCOUNTER — Encounter: Payer: Self-pay | Admitting: Orthopedic Surgery

## 2019-09-25 NOTE — Progress Notes (Signed)
Office Visit Note   Patient: Bradley Espinoza           Date of Birth: [birth year]           MRN: [id number] Visit Date: 09/24/2019              Requested by: Celene Squibb, MD [place],  [place] [address] PCP: Celene Squibb, MD  Chief Complaint  Patient presents with  . Right Foot - Routine Post Op    09/09/2019 Revision Right ankle fusion      HPI: Patient is a 63 year old gentleman who is 2 weeks status post revision right ankle fusion.  Patient is currently in a fracture boot nonweightbearing patient feels like his ankle is infected again he states he has pain with weightbearing he states the pain is constant all day long patient states he is currently on 10 mg of oxycodone from his pain clinic and states he wants something stronger.  Patient states that he did lose his balance and fall on the right ankle once.  He states he is taken 8 Neurontin 300 mg tablets a day.  Assessment & Plan: Visit Diagnoses:  1. S/P ankle fusion     Plan: We will give the patient a prescription for Levaquin for an antibiotic and Toradol to add to his oxycodone.  Patient will follow-up on Monday again discussed the importance of elevation nonweightbearing.  Follow-Up Instructions: Return in about 1 week (around 10/01/2019) for Follow-up on Monday.[facility] Exam  Patient is alert, oriented, no adenopathy, well-dressed, normal affect, normal respiratory effort. Examination patient's ankle is red and swollen medially there is some mild dehiscence of the wound.  Patient has global swelling around the ankle the lateral incision is well-healed.  Imaging: No results found. No images are attached to the encounter.  Labs: Lab Results  Component Value Date   HGBA1C 5.1 06/26/2016   ESRSEDRATE 9 12/08/2018   REPTSTATUS 12/28/2018 FINAL 12/23/2018   GRAMSTAIN  12/23/2018    FEW WBC PRESENT,BOTH PMN AND MONONUCLEAR RARE GRAM POSITIVE COCCI Performed at [facility],  [address] [place], [place] [address]    CULT  12/23/2018    FEW STAPHYLOCOCCUS AUREUS NO ANAEROBES ISOLATED; CULTURE IN PROGRESS FOR 5 DAYS    LABORGA STAPHYLOCOCCUS AUREUS 12/23/2018     Lab Results  Component Value Date   ALBUMIN 4.4 07/02/2019   ALBUMIN 4.3 08/12/2015   ALBUMIN 4.1 05/06/2015    No results found for: MG No results found for: VD25OH  No results found for: PREALBUMIN CBC EXTENDED Latest Ref Rng & Units 09/09/2019 07/02/2019 12/19/2018  WBC 4.0 - 10.5 K/uL 6.0 8.0 6.9  RBC 4.22 - 5.81 MIL/uL 5.72 5.22 4.03(L)  HGB 13.0 - 17.0 g/dL 16.4 14.8 11.8(L)  HCT 39.0 - 52.0 % 51.1 47.8 36.5(L)  PLT 150 - 400 K/uL 232 244 214  NEUTROABS 1.7 - 7.7 K/uL - 5.7 -  LYMPHSABS 0.7 - 4.0 K/uL - 1.6 -     Body mass index is 23.06 kg/m.  Orders:  Orders Placed This Encounter  Procedures  . XR Ankle Complete Right   Meds ordered this encounter  Medications  . levofloxacin (LEVAQUIN) 750 MG tablet    Sig: Take 1 tablet (750 mg total) by mouth daily.    Dispense:  30 tablet    Refill:  0  . ketorolac (TORADOL) 10 MG tablet    Sig: Take 1 tablet (10  mg total) by mouth every 6 (six) hours as needed.    Dispense:  20 tablet    Refill:  0     Procedures: No procedures performed  Clinical Data: No additional findings.  ROS:  All other systems negative, except as noted in the HPI. Review of Systems  Objective: Vital Signs: Ht 6' (1.829 m)   Wt 170 lb (77.1 kg)   BMI 23.06 kg/m   Specialty Comments:  No specialty comments available.  PMFS History: Patient Active Problem List   Diagnosis Date Noted  . Fatty liver 06/30/2019  . Calculus of gallbladder without cholecystitis without obstruction 06/30/2019  . Nausea without vomiting 05/13/2019  . Abdominal bloating 05/13/2019  . Wound infection following procedure 12/19/2018  . Malunion of joint fusion ([place]) 11/26/2018  . 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H[facility])   . Status post lumbar spinal fusion 09/10/2018  . S/P  ankle fusion 11/27/2017  . Nonunion of subtalar arthrodesis   . Avascular necrosis of talus, right ([place])   . Post-traumatic osteoarthritis, left ankle and foot 10/22/2017  . DDD (degenerative disc disease), lumbar 04/05/2017  . Fracture of L2 vertebra ([place]) 01/12/2015  . Acute osteomyelitis of calcaneum ([place]) 10/09/2014  . Dysuria 10/05/2014  . Cellulitis 10/05/2014  . Fall from ladder 09/21/2014  . L2 vertebral fracture ([place]) 09/21/2014  . Bilateral calcaneal fractures 09/21/2014  . Chronic pain 09/21/2014  . Acute blood loss anemia 09/21/2014  . Open fracture of both calcanei 09/15/2014   Past Medical History:  Diagnosis Date  . Anxiety   . Arthritis    low back pain, lumbar radiculopathy  . Depression   . Fracture    B/L ankles  . GERD (gastroesophageal reflux disease)   . Headache(784.0)    allergy related   . History of kidney stones   . History of stomach ulcers   . Retained orthopedic hardware    failed retained hardware right foot  . Wears glasses   . Wears glasses   . Wears partial dentures     Family History  Problem Relation Age of Onset  . Diabetes Father   . Cancer Other     Past Surgical History:  Procedure Laterality Date  . ANKLE FUSION Right 05/11/2015   Procedure: Right Posterior Arthroscopic Subtalar Arthrodesis;  Surgeon: Newt Minion, MD;  Location: [facility];  Service: Orthopedics;  Laterality: Right;  . ANKLE FUSION Right 11/27/2017   Procedure: RIGHT TIBIOCALCANEAL FUSION;  Surgeon: Newt Minion, MD;  Location: [facility];  Service: Orthopedics;  Laterality: Right;  . ANKLE FUSION Right 11/26/2018   Procedure: RIGHT ANTERIOR ANKLE FUSION, APPLY VAC;  Surgeon: Newt Minion, MD;  Location: [facility];  Service: Orthopedics;  Laterality: Right;  . ANKLE FUSION Right 09/09/2019   Procedure: REVISION RIGHT ANKLE FUSION;  Surgeon: Newt Minion, MD;  Location: [facility];  Service: Orthopedics;  Laterality: Right;  . ANTERIOR LAT LUMBAR FUSION N/A 04/05/2017    Procedure: Extreme Lateral Interbody Fusion - Lumbar three-lumbar four ,exploration of fusion Posterior augmentation with globus addition Removal hardware Lumbar one-three. Lumbar four-sacral one,  Removal internal bone growth stimulator;  Surgeon: Kary Kos, MD;  Location: [facility];  Service: Neurosurgery;  Laterality: N/A;  . APPLICATION OF WOUND VAC  12/23/2018   Procedure: Application Of Wound Vac;  Surgeon: Newt Minion, MD;  Location: [facility];  Service: Orthopedics;;  . BACK SURGERY  2004   x 2  . BIOPSY  06/05/2019   Procedure: BIOPSY;  Surgeon:  Rogene Houston, MD;  [facility];  Service: Endoscopy;;  gastric biopsy  . CERVICAL SPINE SURGERY  2008  . CHOLECYSTECTOMY N/A 07/06/2019   Procedure: LAPAROSCOPIC CHOLECYSTECTOMY;  Surgeon: Virl Cagey, MD;  [facility];  Service: General;  Laterality: N/A;  . COLONOSCOPY WITH PROPOFOL N/A 06/05/2019   Procedure: COLONOSCOPY WITH PROPOFOL;  Surgeon: Rogene Houston, MD;  [facility];  Service: Endoscopy;  Laterality: N/A;  . ESOPHAGOGASTRODUODENOSCOPY    . ESOPHAGOGASTRODUODENOSCOPY (EGD) WITH PROPOFOL N/A 06/05/2019   Procedure: ESOPHAGOGASTRODUODENOSCOPY (EGD) WITH PROPOFOL;  Surgeon: Rogene Houston, MD;  [facility];  Service: Endoscopy;  Laterality: N/A;  1220pm  . HARDWARE REMOVAL Right 10/09/2014   Procedure: Removal Deep Hardware, Irrigation and Debridement Calcaneus, Place Antibiotic Beads and Wound VAC ;  Surgeon: Newt Minion, MD;  Location: [facility];  Service: Orthopedics;  Laterality: Right;  . HARDWARE REMOVAL Right 08/12/2015   Procedure: Removal Deep Hardware Right Foot;  Surgeon: Newt Minion, MD;  Location: [facility];  Service: Orthopedics;  Laterality: Right;  . HARDWARE REMOVAL Right 11/26/2018   Procedure: REMOVAL RIGHT TIBIOCALCANEAL NAIL;  Surgeon: Newt Minion, MD;  Location: [facility];  Service: Orthopedics;  Laterality: Right;  . HARDWARE REMOVAL Right 12/23/2018   Procedure: RIGHT ANKLE  REMOVE HARDWARE, PLACE ANTIBIOTIC BEADS and placement of wound vac;  Surgeon: Newt Minion, MD;  Location: [facility];  Service: Orthopedics;  Laterality: Right;  . I & D EXTREMITY Right 09/15/2014   Procedure: IRRIGATION AND DEBRIDEMENT Ankle;  Surgeon: Renette Butters, MD;  Location: [facility];  Service: Orthopedics;  Laterality: Right;  . INGUINAL HERNIA REPAIR Bilateral   . LAMINECTOMY WITH POSTERIOR LATERAL ARTHRODESIS LEVEL 4 N/A 09/10/2018   Procedure: Posterior Lateral Fusion - Thoracic Eleven-Thoracic Twelve - Thoracic Twelve-Lumbar One - Lumbar One-Lumbar Two - Lumbar Two-Lumbar Three with instrumentaion and PLA;  Surgeon: Kary Kos, MD;  Location: [facility];  Service: Neurosurgery;  Laterality: N/A;  Posterior Lateral Fusion - Thoracic Eleven-Thoracic Twelve - Thoracic Twelve-Lumbar One - Lumbar One-Lumbar Two - Lumbar Two-Lumbar Thre  . LIVER BIOPSY N/A 07/06/2019   Procedure: LIVER BIOPSY;  Surgeon: Virl Cagey, MD;  [facility];  Service: General;  Laterality: N/A;  . LUMBAR PERCUTANEOUS PEDICLE SCREW 1 LEVEL N/A 04/05/2017   Procedure: LUMBAR PERCUTANEOUS PEDICLE SCREW LUMBAR THREE-FOUR;  Surgeon: Kary Kos, MD;  Location: [facility];  Service: Neurosurgery;  Laterality: N/A;  . ORIF CALCANEOUS FRACTURE Right 09/19/2014   Procedure: OPEN REDUCTION INTERNAL FIXATION (ORIF) CALCANEOUS FRACTURE;  Surgeon: Newt Minion, MD;  Location: [facility];  Service: Orthopedics;  Laterality: Right;  . ORIF CALCANEOUS FRACTURE Left 09/19/2014   Procedure: OPEN REDUCTION INTERNAL FIXATION (ORIF) CALCANEOUS FRACTURE;  Surgeon: Newt Minion, MD;  Location: [facility];  Service: Orthopedics;  Laterality: Left;  . POLYPECTOMY  06/05/2019   Procedure: POLYPECTOMY;  Surgeon: Rogene Houston, MD;  [facility];  Service: Endoscopy;;  cecal polyp biopsy forcep, ascending polyp cold snare   Social History   Occupational History    Comment: disabled  Tobacco Use  . Smoking status: Former Smoker     Packs/day: 1.00    Years: 10.00    Pack years: 10.00    Types: Cigars    Start date: 10/08/1976    Quit date: 09/2017    Years since quitting: 2.0  . Smokeless tobacco: Never Used  . Tobacco comment: quit smoking cegar 09/10/2018  Substance and Sexual Activity  . Alcohol use: No    Comment:  quit 1998  . Drug use: No  . Sexual activity: Yes    Birth control/protection: None

## 2019-09-28 ENCOUNTER — Ambulatory Visit (INDEPENDENT_AMBULATORY_CARE_PROVIDER_SITE_OTHER): Payer: PPO | Admitting: Orthopedic Surgery

## 2019-09-28 ENCOUNTER — Encounter: Payer: Self-pay | Admitting: Physician Assistant

## 2019-09-28 ENCOUNTER — Encounter (HOSPITAL_COMMUNITY): Payer: Self-pay | Admitting: Orthopedic Surgery

## 2019-09-28 ENCOUNTER — Other Ambulatory Visit: Payer: Self-pay

## 2019-09-28 ENCOUNTER — Other Ambulatory Visit: Payer: Self-pay | Admitting: Physician Assistant

## 2019-09-28 VITALS — Ht 72.0 in | Wt 170.0 lb

## 2019-09-28 DIAGNOSIS — T8149XA Infection following a procedure, other surgical site, initial encounter: Secondary | ICD-10-CM

## 2019-09-28 NOTE — Progress Notes (Signed)
Office Visit Note   Patient: Bradley Espinoza           Date of Birth: 12-27-1955           MRN: [id number] Visit Date: 09/28/2019              Requested by: Celene Squibb, MD [place],  [place] [address] PCP: Celene Squibb, MD  Chief Complaint  Patient presents with  . Right Foot - Routine Post Op    09/09/2019 Revision Right ankle fusion         HPI: Patient is a 63 year old gentleman who presents status post revision fusion of the right ankle.  Patient is on Levaquin Toradol and oxycodone.  Patient states he has been elevating his leg decreasing his weightbearing he has been in a short leg cast.  Patient's wound culture from 6 months ago was positive for staph that was sensitive to fluoroquinolones.  Assessment & Plan: Visit Diagnoses:  1. Wound infection after surgery     Plan: Patient's wound shows signs of a early infection there is wound dehiscence and drainage and odor.  We will plan for surgical debridement tomorrow.  Discussed that we will remove the deep screws will remove the bone graft apply a wound VAC in place him in a immobilization splint.  Discussed that this early infection is mostly likely due to infection secondary to the bone graft.  Discussed that hardware infection usually takes months to develop and we would see lucency around the hardware similar to patient's previous hardware infections.  Will obtain cultures at surgery and maintain his antibiotics until cultures are finalized.  Risk and benefits were discussed including persistent infection potential need for transtibial amputation.  Patient states that due to the increased pain that he is having from his chronic pain he would like to proceed with surgical intervention.  Follow-Up Instructions: Return in about 1 week (around 10/05/2019).   Ortho Exam  Patient is alert, oriented, no adenopathy, well-dressed, normal affect, normal respiratory effort. Examination patient has wound  dehiscence with maceration and clear drainage.  The cellulitis and dermatitis is better but he still has swelling and pain.  Patient states he cannot tolerate the pain that he is having and this is worse than the pain he had prior to revision fusion surgery.  Imaging: No results found. No images are attached to the encounter.  Labs: Lab Results  Component Value Date   HGBA1C 5.1 06/26/2016   ESRSEDRATE 9 12/08/2018   REPTSTATUS 12/28/2018 FINAL 12/23/2018   GRAMSTAIN  12/23/2018    FEW WBC PRESENT,BOTH PMN AND MONONUCLEAR RARE GRAM POSITIVE COCCI Performed at [facility], [address] [place], [place] [address]    CULT  12/23/2018    FEW STAPHYLOCOCCUS AUREUS NO ANAEROBES ISOLATED; CULTURE IN PROGRESS FOR 5 DAYS    LABORGA STAPHYLOCOCCUS AUREUS 12/23/2018     Lab Results  Component Value Date   ALBUMIN 4.4 07/02/2019   ALBUMIN 4.3 08/12/2015   ALBUMIN 4.1 05/06/2015    No results found for: MG No results found for: VD25OH  No results found for: PREALBUMIN CBC EXTENDED Latest Ref Rng & Units 09/09/2019 07/02/2019 12/19/2018  WBC 4.0 - 10.5 K/uL 6.0 8.0 6.9  RBC 4.22 - 5.81 MIL/uL 5.72 5.22 4.03(L)  HGB 13.0 - 17.0 g/dL 16.4 14.8 11.8(L)  HCT 39.0 - 52.0 % 51.1 47.8 36.5(L)  PLT 150 - 400 K/uL 232 244 214  NEUTROABS 1.7 -  7.7 K/uL - 5.7 -  LYMPHSABS 0.7 - 4.0 K/uL - 1.6 -     Body mass index is 23.06 kg/m.  Orders:  No orders of the defined types were placed in this encounter.  No orders of the defined types were placed in this encounter.    Procedures: No procedures performed  Clinical Data: No additional findings.  ROS:  All other systems negative, except as noted in the HPI. Review of Systems  Objective: Vital Signs: Ht 6' (1.829 m)   Wt 170 lb (77.1 kg)   BMI 23.06 kg/m   Specialty Comments:  No specialty comments available.  PMFS History: Patient Active Problem List   Diagnosis Date Noted  . Fatty liver 06/30/2019  . Calculus  of gallbladder without cholecystitis without obstruction 06/30/2019  . Nausea without vomiting 05/13/2019  . Abdominal bloating 05/13/2019  . Wound infection following procedure 12/19/2018  . Malunion of joint fusion ([place]) 11/26/2018  . 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[facility])   . Status post lumbar spinal fusion 09/10/2018  . S/P ankle fusion 11/27/2017  . Nonunion of subtalar arthrodesis   . Avascular necrosis of talus, right ([place])   . Post-traumatic osteoarthritis, left ankle and foot 10/22/2017  . DDD (degenerative disc disease), lumbar 04/05/2017  . Fracture of L2 vertebra ([place]) 01/12/2015  . Acute osteomyelitis of calcaneum ([place]) 10/09/2014  . Dysuria 10/05/2014  . Cellulitis 10/05/2014  . Fall from ladder 09/21/2014  . L2 vertebral fracture ([place]) 09/21/2014  . Bilateral calcaneal fractures 09/21/2014  . Chronic pain 09/21/2014  . Acute blood loss anemia 09/21/2014  . Open fracture of both calcanei 09/15/2014   Past Medical History:  Diagnosis Date  . Anxiety   . Arthritis    low back pain, lumbar radiculopathy  . Depression   . Fracture    B/L ankles  . GERD (gastroesophageal reflux disease)   . Headache(784.0)    allergy related   . History of kidney stones   . History of stomach ulcers   . Retained orthopedic hardware    failed retained hardware right foot  . Wears glasses   . Wears glasses   . Wears partial dentures     Family History  Problem Relation Age of Onset  . Diabetes Father   . Cancer Other     Past Surgical History:  Procedure Laterality Date  . ANKLE FUSION Right 05/11/2015   Procedure: Right Posterior Arthroscopic Subtalar Arthrodesis;  Surgeon: Newt Minion, MD;  Location: [facility];  Service: Orthopedics;  Laterality: Right;  . ANKLE FUSION Right 11/27/2017   Procedure: RIGHT TIBIOCALCANEAL FUSION;  Surgeon: Newt Minion, MD;  Location: [facility];  Service: Orthopedics;  Laterality: Right;  . ANKLE FUSION Right 11/26/2018   Procedure: RIGHT ANTERIOR ANKLE  FUSION, APPLY VAC;  Surgeon: Newt Minion, MD;  Location: [facility];  Service: Orthopedics;  Laterality: Right;  . ANKLE FUSION Right 09/09/2019   Procedure: REVISION RIGHT ANKLE FUSION;  Surgeon: Newt Minion, MD;  Location: [facility];  Service: Orthopedics;  Laterality: Right;  . ANTERIOR LAT LUMBAR FUSION N/A 04/05/2017   Procedure: Extreme Lateral Interbody Fusion - Lumbar three-lumbar four ,exploration of fusion Posterior augmentation with globus addition Removal hardware Lumbar one-three. Lumbar four-sacral one,  Removal internal bone growth stimulator;  Surgeon: Kary Kos, MD;  Location: [facility];  Service: Neurosurgery;  Laterality: N/A;  . APPLICATION OF WOUND VAC  12/23/2018   Procedure: Application Of Wound Vac;  Surgeon: Newt Minion, MD;  Location:  [facility];  Service: Orthopedics;;  . BACK SURGERY  2004   x 2  . BIOPSY  06/05/2019   Procedure: BIOPSY;  Surgeon: Rogene [place], MD;  [facility];  Service: Endoscopy;;  gastric biopsy  . CERVICAL SPINE SURGERY  2008  . CHOLECYSTECTOMY N/A 07/06/2019   Procedure: LAPAROSCOPIC CHOLECYSTECTOMY;  Surgeon: Virl Cagey, MD;  [facility];  Service: General;  Laterality: N/A;  . COLONOSCOPY WITH PROPOFOL N/A 06/05/2019   Procedure: COLONOSCOPY WITH PROPOFOL;  Surgeon: Rogene [facility], MD;  [facility];  Service: Endoscopy;  Laterality: N/A;  . ESOPHAGOGASTRODUODENOSCOPY    . ESOPHAGOGASTRODUODENOSCOPY (EGD) WITH PROPOFOL N/A 06/05/2019   Procedure: ESOPHAGOGASTRODUODENOSCOPY (EGD) WITH PROPOFOL;  Surgeon: Rogene [place], MD;  [facility];  Service: Endoscopy;  Laterality: N/A;  1220pm  . HARDWARE REMOVAL Right 10/09/2014   Procedure: Removal Deep Hardware, Irrigation and Debridement Calcaneus, Place Antibiotic Beads and Wound VAC ;  Surgeon: Newt Minion, MD;  Location: [facility];  Service: Orthopedics;  Laterality: Right;  . HARDWARE REMOVAL Right 08/12/2015   Procedure: Removal Deep Hardware Right Foot;   Surgeon: Newt Minion, MD;  Location: [facility];  Service: Orthopedics;  Laterality: Right;  . HARDWARE REMOVAL Right 11/26/2018   Procedure: REMOVAL RIGHT TIBIOCALCANEAL NAIL;  Surgeon: Newt Minion, MD;  Location: [facility];  Service: Orthopedics;  Laterality: Right;  . HARDWARE REMOVAL Right 12/23/2018   Procedure: RIGHT ANKLE REMOVE HARDWARE, PLACE ANTIBIOTIC BEADS and placement of wound vac;  Surgeon: Newt Minion, MD;  Location: [facility];  Service: Orthopedics;  Laterality: Right;  . I & D EXTREMITY Right 09/15/2014   Procedure: IRRIGATION AND DEBRIDEMENT Ankle;  Surgeon: Renette Butters, MD;  Location: [facility];  Service: Orthopedics;  Laterality: Right;  . INGUINAL HERNIA REPAIR Bilateral   . LAMINECTOMY WITH POSTERIOR LATERAL ARTHRODESIS LEVEL 4 N/A 09/10/2018   Procedure: Posterior Lateral Fusion - Thoracic Eleven-Thoracic Twelve - Thoracic Twelve-Lumbar One - Lumbar One-Lumbar Two - Lumbar Two-Lumbar Three with instrumentaion and PLA;  Surgeon: Kary Kos, MD;  Location: [facility];  Service: Neurosurgery;  Laterality: N/A;  Posterior Lateral Fusion - Thoracic Eleven-Thoracic Twelve - Thoracic Twelve-Lumbar One - Lumbar One-Lumbar Two - Lumbar Two-Lumbar Thre  . LIVER BIOPSY N/A 07/06/2019   Procedure: LIVER BIOPSY;  Surgeon: Virl Cagey, MD;  [facility];  Service: General;  Laterality: N/A;  . LUMBAR PERCUTANEOUS PEDICLE SCREW 1 LEVEL N/A 04/05/2017   Procedure: LUMBAR PERCUTANEOUS PEDICLE SCREW LUMBAR THREE-FOUR;  Surgeon: Kary Kos, MD;  Location: [facility];  Service: Neurosurgery;  Laterality: N/A;  . ORIF CALCANEOUS FRACTURE Right 09/19/2014   Procedure: OPEN REDUCTION INTERNAL FIXATION (ORIF) CALCANEOUS FRACTURE;  Surgeon: Newt Minion, MD;  Location: [facility];  Service: Orthopedics;  Laterality: Right;  . ORIF CALCANEOUS FRACTURE Left 09/19/2014   Procedure: OPEN REDUCTION INTERNAL FIXATION (ORIF) CALCANEOUS FRACTURE;  Surgeon: Newt Minion, MD;  Location: [facility];  Service: Orthopedics;   Laterality: Left;  . POLYPECTOMY  06/05/2019   Procedure: POLYPECTOMY;  Surgeon: Rogene [facility], MD;  [facility];  Service: Endoscopy;;  cecal polyp biopsy forcep, ascending polyp cold snare   Social History   Occupational History    Comment: disabled  Tobacco Use  . Smoking status: Former Smoker    Packs/day: 1.00    Years: 10.00    Pack years: 10.00    Types: Cigars    Start date: 10/08/1976  Quit date: 09/2017    Years since quitting: 2.0  . Smokeless tobacco: Never Used  . Tobacco comment: quit smoking cegar 09/10/2018  Substance and Sexual Activity  . Alcohol use: No    Comment:  quit 1998  . Drug use: No  . Sexual activity: Yes    Birth control/protection: None

## 2019-09-28 NOTE — Progress Notes (Signed)
Bradley Espinoza said that he is unable to go to [facility] prior to arriving at the hospital, he will be tested on arrival. I began to tell Bradley Espinoza that he could have clear liquids until 3 hours before surgery; he said they have always told me nothing to eat or drink after midnight.  I informed him that it is a new practice at [facility], he did not want to her about it.

## 2019-09-29 ENCOUNTER — Encounter (HOSPITAL_COMMUNITY)
Admission: RE | Disposition: A | Discharge status: Home / Self Care | Payer: Self-pay | Source: Home / Self Care | Attending: Orthopedic Surgery

## 2019-09-29 ENCOUNTER — Encounter (HOSPITAL_COMMUNITY): Payer: Self-pay | Admitting: Orthopedic Surgery

## 2019-09-29 ENCOUNTER — Ambulatory Visit (HOSPITAL_COMMUNITY): Payer: PPO | Admitting: Anesthesiology

## 2019-09-29 ENCOUNTER — Ambulatory Visit (HOSPITAL_COMMUNITY)
Admission: RE | Admit: 2019-09-29 | Discharge: 2019-09-29 | Disposition: A | Discharge status: Home / Self Care | Payer: PPO | Attending: Orthopedic Surgery | Admitting: Orthopedic Surgery

## 2019-09-29 DIAGNOSIS — Z981 Arthrodesis status: Secondary | ICD-10-CM | POA: Insufficient documentation

## 2019-09-29 DIAGNOSIS — M86261 Subacute osteomyelitis, right tibia and fibula: Secondary | ICD-10-CM

## 2019-09-29 DIAGNOSIS — Z885 Allergy status to narcotic agent status: Secondary | ICD-10-CM | POA: Diagnosis not present

## 2019-09-29 DIAGNOSIS — M1990 Unspecified osteoarthritis, unspecified site: Secondary | ICD-10-CM | POA: Insufficient documentation

## 2019-09-29 DIAGNOSIS — Z20828 Contact with and (suspected) exposure to other viral communicable diseases: Secondary | ICD-10-CM | POA: Diagnosis not present

## 2019-09-29 DIAGNOSIS — Y838 Other surgical procedures as the cause of abnormal reaction of the patient, or of later complication, without mention of misadventure at the time of the procedure: Secondary | ICD-10-CM | POA: Diagnosis not present

## 2019-09-29 DIAGNOSIS — K219 Gastro-esophageal reflux disease without esophagitis: Secondary | ICD-10-CM | POA: Diagnosis not present

## 2019-09-29 DIAGNOSIS — Z87891 Personal history of nicotine dependence: Secondary | ICD-10-CM | POA: Insufficient documentation

## 2019-09-29 DIAGNOSIS — D62 Acute posthemorrhagic anemia: Secondary | ICD-10-CM | POA: Diagnosis not present

## 2019-09-29 DIAGNOSIS — L02415 Cutaneous abscess of right lower limb: Secondary | ICD-10-CM

## 2019-09-29 DIAGNOSIS — T8469XA Infection and inflammatory reaction due to internal fixation device of other site, initial encounter: Secondary | ICD-10-CM | POA: Insufficient documentation

## 2019-09-29 HISTORY — PX: HARDWARE REMOVAL: SHX979

## 2019-09-29 LAB — CBC
HCT: 42 % (ref 39.0–52.0)
Hemoglobin: 13.1 g/dL (ref 13.0–17.0)
MCH: 28.1 pg (ref 26.0–34.0)
MCHC: 31.2 g/dL (ref 30.0–36.0)
MCV: 89.9 fL (ref 80.0–100.0)
Platelets: 305 10*3/uL (ref 150–400)
RBC: 4.67 MIL/uL (ref 4.22–5.81)
RDW: 15.6 % — ABNORMAL HIGH (ref 11.5–15.5)
WBC: 6.8 10*3/uL (ref 4.0–10.5)
nRBC: 0 % (ref 0.0–0.2)

## 2019-09-29 LAB — RESPIRATORY PANEL BY RT PCR (FLU A&B, COVID)
Influenza A by PCR: NEGATIVE
Influenza B by PCR: NEGATIVE
SARS Coronavirus 2 by RT PCR: NEGATIVE

## 2019-09-29 SURGERY — REMOVAL, HARDWARE
Anesthesia: General | Site: Leg Lower | Laterality: Right

## 2019-09-29 MED ORDER — FENTANYL CITRATE (PF) 250 MCG/5ML IJ SOLN
INTRAMUSCULAR | Status: AC
  Filled 2019-09-29: qty 5

## 2019-09-29 MED ORDER — PROMETHAZINE HCL 25 MG/ML IJ SOLN: 6.2500 mg | INTRAMUSCULAR | Status: DC | PRN

## 2019-09-29 MED ORDER — MIDAZOLAM HCL 2 MG/2ML IJ SOLN
INTRAMUSCULAR | Status: AC
  Filled 2019-09-29: qty 2

## 2019-09-29 MED ORDER — HYDROMORPHONE HCL 1 MG/ML IJ SOLN
INTRAMUSCULAR | Status: AC
  Filled 2019-09-29: qty 1

## 2019-09-29 MED ORDER — PROPOFOL 10 MG/ML IV BOLUS
INTRAVENOUS | Status: DC | PRN
  Administered 2019-09-29: 200 mg via INTRAVENOUS

## 2019-09-29 MED ORDER — DEXAMETHASONE SODIUM PHOSPHATE 10 MG/ML IJ SOLN
INTRAMUSCULAR | Status: AC
  Filled 2019-09-29: qty 2

## 2019-09-29 MED ORDER — ONDANSETRON HCL 4 MG/2ML IJ SOLN
INTRAMUSCULAR | Status: AC
  Filled 2019-09-29: qty 8

## 2019-09-29 MED ORDER — LIDOCAINE 2% (20 MG/ML) 5 ML SYRINGE
INTRAMUSCULAR | Status: DC | PRN
  Administered 2019-09-29: 50 mg via INTRAVENOUS

## 2019-09-29 MED ORDER — PROPOFOL 10 MG/ML IV BOLUS
INTRAVENOUS | Status: AC
  Filled 2019-09-29: qty 20

## 2019-09-29 MED ORDER — VANCOMYCIN HCL 500 MG IV SOLR
INTRAVENOUS | Status: AC
  Filled 2019-09-29: qty 500

## 2019-09-29 MED ORDER — STERILE WATER FOR IRRIGATION IR SOLN
Status: DC | PRN
  Administered 2019-09-29: 1000 mL

## 2019-09-29 MED ORDER — HYDROMORPHONE HCL 1 MG/ML IJ SOLN
INTRAMUSCULAR | Status: DC | PRN
  Administered 2019-09-29: 1 mg via INTRAVENOUS

## 2019-09-29 MED ORDER — FENTANYL CITRATE (PF) 100 MCG/2ML IJ SOLN
INTRAMUSCULAR | Status: AC
  Administered 2019-09-29: 50 ug via INTRAVENOUS
  Filled 2019-09-29: qty 2

## 2019-09-29 MED ORDER — DEXAMETHASONE SODIUM PHOSPHATE 10 MG/ML IJ SOLN
INTRAMUSCULAR | Status: DC | PRN
  Administered 2019-09-29: 5 mg via INTRAVENOUS

## 2019-09-29 MED ORDER — FENTANYL CITRATE (PF) 250 MCG/5ML IJ SOLN
INTRAMUSCULAR | Status: DC | PRN
  Administered 2019-09-29: 25 ug via INTRAVENOUS
  Administered 2019-09-29 (×4): 50 ug via INTRAVENOUS
  Administered 2019-09-29: 25 ug via INTRAVENOUS
  Administered 2019-09-29 (×5): 50 ug via INTRAVENOUS

## 2019-09-29 MED ORDER — CEFAZOLIN SODIUM-DEXTROSE 2-4 GM/100ML-% IV SOLN
2.0000 g | INTRAVENOUS | Status: AC
  Administered 2019-09-29: 2 g via INTRAVENOUS

## 2019-09-29 MED ORDER — 0.9 % SODIUM CHLORIDE (POUR BTL) OPTIME
TOPICAL | Status: DC | PRN
  Administered 2019-09-29: 1000 mL

## 2019-09-29 MED ORDER — LACTATED RINGERS IV SOLN: INTRAVENOUS | Status: DC

## 2019-09-29 MED ORDER — LIDOCAINE 2% (20 MG/ML) 5 ML SYRINGE
INTRAMUSCULAR | Status: AC
  Filled 2019-09-29: qty 20

## 2019-09-29 MED ORDER — LIDOCAINE 2% (20 MG/ML) 5 ML SYRINGE
INTRAMUSCULAR | Status: AC
  Filled 2019-09-29: qty 5

## 2019-09-29 MED ORDER — MIDAZOLAM HCL 2 MG/2ML IJ SOLN
INTRAMUSCULAR | Status: DC | PRN
  Administered 2019-09-29: 2 mg via INTRAVENOUS

## 2019-09-29 MED ORDER — EPHEDRINE 5 MG/ML INJ
INTRAVENOUS | Status: AC
  Filled 2019-09-29: qty 10

## 2019-09-29 MED ORDER — HYDROMORPHONE HCL 1 MG/ML IJ SOLN
0.2500 mg | INTRAMUSCULAR | Status: DC | PRN
  Administered 2019-09-29 (×4): 0.5 mg via INTRAVENOUS

## 2019-09-29 MED ORDER — SUCCINYLCHOLINE CHLORIDE 200 MG/10ML IV SOSY
PREFILLED_SYRINGE | INTRAVENOUS | Status: AC
  Filled 2019-09-29: qty 20

## 2019-09-29 MED ORDER — POVIDONE-IODINE 10 % EX SWAB
2.0000 "application " | Freq: Once | CUTANEOUS | Status: AC
  Administered 2019-09-29: 2 via TOPICAL

## 2019-09-29 MED ORDER — PHENYLEPHRINE 40 MCG/ML (10ML) SYRINGE FOR IV PUSH (FOR BLOOD PRESSURE SUPPORT)
PREFILLED_SYRINGE | INTRAVENOUS | Status: AC
  Filled 2019-09-29: qty 20

## 2019-09-29 MED ORDER — CEFAZOLIN SODIUM-DEXTROSE 2-4 GM/100ML-% IV SOLN
INTRAVENOUS | Status: AC
  Filled 2019-09-29: qty 100

## 2019-09-29 MED ORDER — CHLORHEXIDINE GLUCONATE 4 % EX LIQD: 60.0000 mL | Freq: Once | CUTANEOUS | Status: DC

## 2019-09-29 MED ORDER — FENTANYL CITRATE (PF) 100 MCG/2ML IJ SOLN: 50.0000 ug | Freq: Once | INTRAMUSCULAR | Status: AC

## 2019-09-29 MED ORDER — VANCOMYCIN HCL 1000 MG IV SOLR
INTRAVENOUS | Status: AC
  Filled 2019-09-29: qty 1000

## 2019-09-29 MED ORDER — ONDANSETRON HCL 4 MG/2ML IJ SOLN
INTRAMUSCULAR | Status: DC | PRN
  Administered 2019-09-29: 4 mg via INTRAVENOUS

## 2019-09-29 MED ORDER — VANCOMYCIN HCL 1000 MG IV SOLR
INTRAVENOUS | Status: DC | PRN
  Administered 2019-09-29: 1000 mg

## 2019-09-29 SURGICAL SUPPLY — 51 items
BANDAGE ESMARK 6X9 LF (GAUZE/BANDAGES/DRESSINGS) IMPLANT
BNDG COHESIVE 4X5 TAN STRL (GAUZE/BANDAGES/DRESSINGS) IMPLANT
BNDG COHESIVE 6X5 TAN STRL LF (GAUZE/BANDAGES/DRESSINGS) ×2 IMPLANT
BNDG ESMARK 6X9 LF (GAUZE/BANDAGES/DRESSINGS)
BNDG GAUZE ELAST 4 BULKY (GAUZE/BANDAGES/DRESSINGS) ×3 IMPLANT
COVER SURGICAL LIGHT HANDLE (MISCELLANEOUS) ×6 IMPLANT
COVER WAND RF STERILE (DRAPES) ×1 IMPLANT
CUFF TOURN SGL QUICK 34 (TOURNIQUET CUFF)
CUFF TOURN SGL QUICK 42 (TOURNIQUET CUFF) IMPLANT
CUFF TRNQT CYL 34X4.125X (TOURNIQUET CUFF) IMPLANT
DRAPE C-ARM 42X72 X-RAY (DRAPES) IMPLANT
DRAPE C-ARM MINI 42X72 WSTRAPS (DRAPES) ×2 IMPLANT
DRAPE INCISE IOBAN 66X45 STRL (DRAPES) ×4 IMPLANT
DRAPE ORTHO SPLIT 77X108 STRL (DRAPES)
DRAPE SURG ORHT 6 SPLT 77X108 (DRAPES) IMPLANT
DRAPE U-SHAPE 47X51 STRL (DRAPES) ×3 IMPLANT
DRSG EMULSION OIL 3X3 NADH (GAUZE/BANDAGES/DRESSINGS) ×1 IMPLANT
DRSG PAD ABDOMINAL 8X10 ST (GAUZE/BANDAGES/DRESSINGS) ×1 IMPLANT
DURAPREP 26ML APPLICATOR (WOUND CARE) ×3 IMPLANT
ELECT REM PT RETURN 9FT ADLT (ELECTROSURGICAL) ×3
ELECTRODE REM PT RTRN 9FT ADLT (ELECTROSURGICAL) ×1 IMPLANT
GAUZE SPONGE 4X4 12PLY STRL (GAUZE/BANDAGES/DRESSINGS) ×3 IMPLANT
GLOVE BIO SURGEON STRL SZ 6.5 (GLOVE) ×1 IMPLANT
GLOVE BIO SURGEONS STRL SZ 6.5 (GLOVE) ×1
GLOVE BIOGEL PI IND STRL 7.0 (GLOVE) IMPLANT
GLOVE BIOGEL PI IND STRL 9 (GLOVE) ×1 IMPLANT
GLOVE BIOGEL PI INDICATOR 7.0 (GLOVE) ×2
GLOVE BIOGEL PI INDICATOR 9 (GLOVE) ×2
GLOVE SURG ORTHO 9.0 STRL STRW (GLOVE) ×3 IMPLANT
GOWN STRL REUS W/ TWL XL LVL3 (GOWN DISPOSABLE) ×3 IMPLANT
GOWN STRL REUS W/TWL XL LVL3 (GOWN DISPOSABLE) ×6
KIT BASIN OR (CUSTOM PROCEDURE TRAY) ×3 IMPLANT
KIT TURNOVER KIT B (KITS) ×3 IMPLANT
MANIFOLD NEPTUNE II (INSTRUMENTS) ×1 IMPLANT
NS IRRIG 1000ML POUR BTL (IV SOLUTION) ×3 IMPLANT
PACK ORTHO EXTREMITY (CUSTOM PROCEDURE TRAY) ×3 IMPLANT
PAD ARMBOARD 7.5X6 YLW CONV (MISCELLANEOUS) ×6 IMPLANT
PREVENA RESTOR AXIOFORM 29X28 (GAUZE/BANDAGES/DRESSINGS) ×2 IMPLANT
SPONGE LAP 18X18 RF (DISPOSABLE) IMPLANT
STAPLER VISISTAT 35W (STAPLE) IMPLANT
STOCKINETTE IMPERVIOUS 9X36 MD (GAUZE/BANDAGES/DRESSINGS) IMPLANT
SUT ETHILON 2 0 PSLX (SUTURE) ×4 IMPLANT
SUT VIC AB 0 CT1 27 (SUTURE)
SUT VIC AB 0 CT1 27XBRD ANBCTR (SUTURE) IMPLANT
SUT VIC AB 2-0 CT1 27 (SUTURE)
SUT VIC AB 2-0 CT1 TAPERPNT 27 (SUTURE) IMPLANT
TOWEL GREEN STERILE (TOWEL DISPOSABLE) ×3 IMPLANT
TOWEL GREEN STERILE FF (TOWEL DISPOSABLE) ×1 IMPLANT
UNDERPAD 30X30 (UNDERPADS AND DIAPERS) ×3 IMPLANT
WATER STERILE IRR 1000ML POUR (IV SOLUTION) ×3 IMPLANT
YANKAUER SUCT BULB TIP NO VENT (SUCTIONS) ×2 IMPLANT

## 2019-09-29 NOTE — Anesthesia Postprocedure Evaluation (Signed)
Anesthesia Post Note  Patient: Bradley Espinoza  Procedure(s) Performed: RIGHT ANKLE REMOVE HARDWARE, DEBRIDEMENT (Right Leg Lower)     Patient location during evaluation: PACU Anesthesia Type: General Level of consciousness: awake and alert Pain management: pain level controlled Vital Signs Assessment: post-procedure vital signs reviewed and stable Respiratory status: spontaneous breathing, nonlabored ventilation, respiratory function stable and patient connected to nasal cannula oxygen Cardiovascular status: blood pressure returned to baseline and stable Postop Assessment: no apparent nausea or vomiting Anesthetic complications: no    Last Vitals:  Vitals:   09/29/19 1413 09/29/19 1428  BP: (!) 154/78 113/70  Pulse: 86 85  Resp: 19 13  Temp:  36.6 C  SpO2: 94% 94%    Last Pain:  Vitals:   09/29/19 1428  PainSc: 3                  Tiajuana Amass

## 2019-09-29 NOTE — OR Nursing (Signed)
Implants from right ankle removed from patient. The following implants explanted from patient on 09/29/2019: 7x65 [id number], 7x55 LOG [id number], 7x70 [id number]

## 2019-09-29 NOTE — Anesthesia Preprocedure Evaluation (Signed)
Anesthesia Evaluation  Patient identified by MRN, date of birth, ID band Patient awake    Reviewed: Allergy & Precautions, NPO status , Patient's Chart, lab work & pertinent test results  Airway Mallampati: II  TM Distance: >3 FB     Dental  (+) Dental Advisory Given   Pulmonary former smoker,    breath sounds clear to auscultation       Cardiovascular negative cardio ROS   Rhythm:Regular Rate:Normal     Neuro/Psych negative neurological ROS     GI/Hepatic Neg liver ROS, GERD  ,  Endo/Other  negative endocrine ROS  Renal/GU negative Renal ROS     Musculoskeletal  (+) Arthritis ,   Abdominal   Peds  Hematology negative hematology ROS (+)   Anesthesia Other Findings   Reproductive/Obstetrics                             Lab Results  Component Value Date   WBC 6.8 09/29/2019   HGB 13.1 09/29/2019   HCT 42.0 09/29/2019   MCV 89.9 09/29/2019   PLT 305 09/29/2019   Lab Results  Component Value Date   CREATININE 0.71 07/02/2019   BUN 17 07/02/2019   NA 137 07/02/2019   K 4.0 07/02/2019   CL 104 07/02/2019   CO2 23 07/02/2019    Anesthesia Physical Anesthesia Plan  ASA: II  Anesthesia Plan: General   Post-op Pain Management:    Induction: Intravenous  PONV Risk Score and Plan: 2 and Dexamethasone, Ondansetron and Treatment may vary due to age or medical condition  Airway Management Planned: LMA  Additional Equipment:   Intra-op Plan:   Post-operative Plan: Extubation in OR  Informed Consent: I have reviewed the patients History and Physical, chart, labs and discussed the procedure including the risks, benefits and alternatives for the proposed anesthesia with the patient or authorized representative who has indicated his/her understanding and acceptance.     Dental advisory given  Plan Discussed with: CRNA  Anesthesia Plan Comments:         Anesthesia  Quick Evaluation

## 2019-09-29 NOTE — Op Note (Signed)
09/29/2019  1:45 PM  PATIENT:  Bradley Espinoza    PRE-OPERATIVE DIAGNOSIS:  Infected Right Ankle Fusion  POST-OPERATIVE DIAGNOSIS:  Same  PROCEDURE:  RIGHT ANKLE REMOVE HARDWARE,  DEBRIDEMENT with partial excision of tibia and excision of skin and soft tissue muscle right ankle. Cultures sent with tissue. Placement of 1 g vancomycin powder. Local tissue rearrangement for wound closure 4 x 10 cm.    SURGEON:  Newt Minion, MD  PHYSICIAN ASSISTANT:None ANESTHESIA:   General  PREOPERATIVE INDICATIONS:  [name] is a  63 y.o. male with a diagnosis of Infected Right Ankle Fusion who failed conservative measures and elected for surgical management.    The risks benefits and alternatives were discussed with the patient preoperatively including but not limited to the risks of infection, bleeding, nerve injury, cardiopulmonary complications, the need for revision surgery, among others, and the patient was willing to proceed.  OPERATIVE IMPLANTS: 1 g of vancomycin powder. Tissue culture sent.  @ENCIMAGES @  OPERATIVE FINDINGS: Purulent drainage hardware did not appear infected but was close to the infection.  OPERATIVE PROCEDURE: Patient was brought to the operating room and underwent a general anesthetic.  After adequate levels anesthesia were obtained patient's right lower extremity was prepped using DuraPrep draped into a sterile field a timeout was called.  An elliptical incision was made around the wound which left a soft tissue defect 4 x 10 cm.  There was purulent abscess the tissue and abscess was sent for cultures and further soft tissue was resected to get back to healthy margins.  The wound was irrigated with normal saline.  The 3 cannulated screws were then removed 2 through the medial incision 1 from the lateral incision.  The tibiotalar joint was further debrided with a curette rondure to excise the tibial talar joint including the bone graft tissue which appears  to been the origin of the infection.  The wound was further irrigated.  All tissue margins were clear bone had good petechial bleeding and local tissue rearrangement was used to close the incision that was 410 cm with 2-0 nylon.  A axial form wound VAC dressing was applied this was hooked to the hospital pump this showed a good suction fit for the 125 portable VAC a posterior and sugar tong splint was applied patient was extubated taken the PACU in stable condition.   DISCHARGE PLANNING:  Antibiotic duration patient will continue with his oral antibiotics of Levaquin and we will adjust antibiotics pending the tissue cultures.  Patient does have topical vancomycin for better gram-positive coverage.  Weightbearing: Patient will be strict nonweightbearing on the right  Pain medication: Patient has pain medicine at home  Dressing care/ Wound VAC: Will transition to the portable Praveena pump at discharge.  Ambulatory devices: Crutches  Discharge to: Home.  Follow-up: In the office 1 week post operative.

## 2019-09-29 NOTE — Transfer of Care (Signed)
Immediate Anesthesia Transfer of Care Note  Patient: Bradley Espinoza  Procedure(s) Performed: RIGHT ANKLE REMOVE HARDWARE, DEBRIDEMENT (Right Leg Lower)  Patient Location: PACU  Anesthesia Type:General  Level of Consciousness: drowsy and patient cooperative  Airway & Oxygen Therapy: Patient Spontanous Breathing  Post-op Assessment: Report given to RN and Post -op Vital signs reviewed and stable  Post vital signs: Reviewed and stable  Last Vitals:  Vitals Value Taken Time  BP    Temp    Pulse 87 09/29/19 1345  Resp 16 09/29/19 1345  SpO2 96 % 09/29/19 1345  Vitals shown include unvalidated device data.  Last Pain:  Vitals:   09/29/19 0925  PainSc: 8       Patients Stated Pain Goal: 3 ([id number])  Complications: No apparent anesthesia complications

## 2019-09-29 NOTE — H&P (Signed)
Bradley Espinoza is an 63 y.o. male.   Chief Complaint: Right Ankle Pain HPI:  Patient is a 63 year old gentleman who is 2 weeks status post revision right ankle fusion.  Patient is currently in a fracture boot nonweightbearing patient feels like his ankle is infected again he states he has pain with weightbearing he states the pain is constant all day long patient states he is currently on 10 mg of oxycodone from his pain clinic and states he wants something stronger.  Patient states that he did lose his balance and fall on the right ankle once.  He states he is taken 8 Neurontin 300 mg tablets a day. Past Medical History:  Diagnosis Date  . Anxiety   . Arthritis    low back pain, lumbar radiculopathy  . Depression   . Fracture    B/L ankles  . GERD (gastroesophageal reflux disease)   . Headache(784.0)    allergy related   . History of kidney stones   . History of stomach ulcers   . Retained orthopedic hardware    failed retained hardware right foot  . Wears glasses   . Wears glasses   . Wears partial dentures     Past Surgical History:  Procedure Laterality Date  . ANKLE FUSION Right 05/11/2015   Procedure: Right Posterior Arthroscopic Subtalar Arthrodesis;  Surgeon: Newt Minion, MD;  Location: [facility];  Service: Orthopedics;  Laterality: Right;  . ANKLE FUSION Right 11/27/2017   Procedure: RIGHT TIBIOCALCANEAL FUSION;  Surgeon: Newt Minion, MD;  Location: [facility];  Service: Orthopedics;  Laterality: Right;  . ANKLE FUSION Right 11/26/2018   Procedure: RIGHT ANTERIOR ANKLE FUSION, APPLY VAC;  Surgeon: Newt Minion, MD;  Location: [facility];  Service: Orthopedics;  Laterality: Right;  . ANKLE FUSION Right 09/09/2019   Procedure: REVISION RIGHT ANKLE FUSION;  Surgeon: Newt Minion, MD;  Location: [facility];  Service: Orthopedics;  Laterality: Right;  . ANTERIOR LAT LUMBAR FUSION N/A 04/05/2017   Procedure: Extreme Lateral Interbody Fusion - Lumbar three-lumbar four ,exploration of  fusion Posterior augmentation with globus addition Removal hardware Lumbar one-three. Lumbar four-sacral one,  Removal internal bone growth stimulator;  Surgeon: Kary Kos, MD;  Location: [facility];  Service: Neurosurgery;  Laterality: N/A;  . APPLICATION OF WOUND VAC  12/23/2018   Procedure: Application Of Wound Vac;  Surgeon: Newt Minion, MD;  Location: [facility];  Service: Orthopedics;;  . BACK SURGERY  2004   x 2  . BIOPSY  06/05/2019   Procedure: BIOPSY;  Surgeon: Rogene Houston, MD;  [facility];  Service: Endoscopy;;  gastric biopsy  . CERVICAL SPINE SURGERY  2008  . CHOLECYSTECTOMY N/A 07/06/2019   Procedure: LAPAROSCOPIC CHOLECYSTECTOMY;  Surgeon: Virl Cagey, MD;  [facility];  Service: General;  Laterality: N/A;  . COLONOSCOPY WITH PROPOFOL N/A 06/05/2019   Procedure: COLONOSCOPY WITH PROPOFOL;  Surgeon: Rogene Houston, MD;  [facility];  Service: Endoscopy;  Laterality: N/A;  . ESOPHAGOGASTRODUODENOSCOPY    . ESOPHAGOGASTRODUODENOSCOPY (EGD) WITH PROPOFOL N/A 06/05/2019   Procedure: ESOPHAGOGASTRODUODENOSCOPY (EGD) WITH PROPOFOL;  Surgeon: Rogene Houston, MD;  [facility];  Service: Endoscopy;  Laterality: N/A;  1220pm  . HARDWARE REMOVAL Right 10/09/2014   Procedure: Removal Deep Hardware, Irrigation and Debridement Calcaneus, Place Antibiotic Beads and Wound VAC ;  Surgeon: Newt Minion, MD;  Location: [facility];  Service: Orthopedics;  Laterality: Right;  . HARDWARE REMOVAL Right  08/12/2015   Procedure: Removal Deep Hardware Right Foot;  Surgeon: Newt Minion, MD;  Location: [facility];  Service: Orthopedics;  Laterality: Right;  . HARDWARE REMOVAL Right 11/26/2018   Procedure: REMOVAL RIGHT TIBIOCALCANEAL NAIL;  Surgeon: Newt Minion, MD;  Location: [facility];  Service: Orthopedics;  Laterality: Right;  . HARDWARE REMOVAL Right 12/23/2018   Procedure: RIGHT ANKLE REMOVE HARDWARE, PLACE ANTIBIOTIC BEADS and placement of wound vac;  Surgeon: Newt Minion, MD;  Location: [facility];  Service: Orthopedics;  Laterality: Right;  . I & D EXTREMITY Right 09/15/2014   Procedure: IRRIGATION AND DEBRIDEMENT Ankle;  Surgeon: Renette Butters, MD;  Location: [facility];  Service: Orthopedics;  Laterality: Right;  . INGUINAL HERNIA REPAIR Bilateral   . LAMINECTOMY WITH POSTERIOR LATERAL ARTHRODESIS LEVEL 4 N/A 09/10/2018   Procedure: Posterior Lateral Fusion - Thoracic Eleven-Thoracic Twelve - Thoracic Twelve-Lumbar One - Lumbar One-Lumbar Two - Lumbar Two-Lumbar Three with instrumentaion and PLA;  Surgeon: Kary Kos, MD;  Location: [facility];  Service: Neurosurgery;  Laterality: N/A;  Posterior Lateral Fusion - Thoracic Eleven-Thoracic Twelve - Thoracic Twelve-Lumbar One - Lumbar One-Lumbar Two - Lumbar Two-Lumbar Thre  . LIVER BIOPSY N/A 07/06/2019   Procedure: LIVER BIOPSY;  Surgeon: Virl Cagey, MD;  [facility];  Service: General;  Laterality: N/A;  . LUMBAR PERCUTANEOUS PEDICLE SCREW 1 LEVEL N/A 04/05/2017   Procedure: LUMBAR PERCUTANEOUS PEDICLE SCREW LUMBAR THREE-FOUR;  Surgeon: Kary Kos, MD;  Location: [facility];  Service: Neurosurgery;  Laterality: N/A;  . ORIF CALCANEOUS FRACTURE Right 09/19/2014   Procedure: OPEN REDUCTION INTERNAL FIXATION (ORIF) CALCANEOUS FRACTURE;  Surgeon: Newt Minion, MD;  Location: [facility];  Service: Orthopedics;  Laterality: Right;  . ORIF CALCANEOUS FRACTURE Left 09/19/2014   Procedure: OPEN REDUCTION INTERNAL FIXATION (ORIF) CALCANEOUS FRACTURE;  Surgeon: Newt Minion, MD;  Location: [facility];  Service: Orthopedics;  Laterality: Left;  . POLYPECTOMY  06/05/2019   Procedure: POLYPECTOMY;  Surgeon: Rogene Houston, MD;  [facility];  Service: Endoscopy;;  cecal polyp biopsy forcep, ascending polyp cold snare    Family History  Problem Relation Age of Onset  . Diabetes Father   . Cancer Other    Social History:  reports that he quit smoking about 2 years ago. His smoking use included cigars. He started  smoking about 43 years ago. He has a 10.00 pack-year smoking history. He has never used smokeless tobacco. He reports that he does not drink alcohol or use drugs.  Allergies:  Allergies  Allergen Reactions  . Codeine Nausea And Vomiting    No medications prior to admission.    No results found for this or any previous visit (from the past 48 hour(s)). No results found.  Review of Systems  All other systems reviewed and are negative.   There were no vitals taken for this visit. Physical Exam  Patient is alert, oriented, no adenopathy, well-dressed, normal affect, normal respiratory effort. Examination patient's ankle is red and swollen medially there is some mild dehiscence of the wound.  Patient has global swelling around the ankle the lateral incision is well-healed. Assessment/Plan 1. S/P ankle fusion     Plan: We will give the patient a prescription for Levaquin for an antibiotic and Toradol to add to his oxycodone.  Patient will follow-up on Monday again discussed the importance of elevation nonweightbearing.  Follow-Up Instructions: Return in about 1 week (around 10/01/2019) for Follow-up on Monday.Bevely Palmer  Arian Murley, PA 09/29/2019, 7:03 AM

## 2019-09-29 NOTE — Anesthesia Procedure Notes (Addendum)
Procedure Name: LMA Insertion Date/Time: 09/29/2019 12:34 PM Performed by: Janace Litten, CRNA Pre-anesthesia Checklist: Patient identified, Emergency Drugs available, Suction available and Patient being monitored Patient Re-evaluated:Patient Re-evaluated prior to induction Oxygen Delivery Method: Circle System Utilized Preoxygenation: Pre-oxygenation with 100% oxygen Induction Type: IV induction Ventilation: Mask ventilation without difficulty LMA: LMA inserted LMA Size: 4.0 Number of attempts: 1 Airway Equipment and Method: Bite block Placement Confirmation: positive ETCO2 Tube secured with: Tape Dental Injury: Teeth and Oropharynx as per pre-operative assessment

## 2019-09-30 ENCOUNTER — Telehealth: Payer: Self-pay | Admitting: Physician Assistant

## 2019-09-30 ENCOUNTER — Other Ambulatory Visit: Payer: Self-pay | Admitting: Physician Assistant

## 2019-09-30 ENCOUNTER — Telehealth: Payer: Self-pay | Admitting: Orthopedic Surgery

## 2019-09-30 MED ORDER — NYSTATIN 100000 UNIT/ML MT SUSP: 5.0000 mL | Freq: Four times a day (QID) | OROMUCOSAL | 0 refills | Status: DC

## 2019-09-30 NOTE — Telephone Encounter (Signed)
Patient called and requesting Dr. Sharol Given nurse give call back. Patient is having a reaction to antibiotics proscribed. Patient states he would like a new prescription call ed in to his pharmacy on file. Patient phone number is [phone].

## 2019-09-30 NOTE — Telephone Encounter (Signed)
I called and advised pt

## 2019-09-30 NOTE — Telephone Encounter (Signed)
Patient called advised the cast is so tight around his leg that it's hurting quite a bit Patient said he do not know how long he can deal with the pain. The number to contact patient is [phone]

## 2019-09-30 NOTE — Progress Notes (Unsigned)
Nystatin

## 2019-09-30 NOTE — Telephone Encounter (Signed)
I called and sw pt. Per PA he has on a splint from surgery and he can remove the ace bandage and loosen the splint ( stir up splint) and then reapply the ace. Encourage the pt to elevate his leg higher than his heart and he will call with any questions.

## 2019-09-30 NOTE — Telephone Encounter (Signed)
Sent to his pharmacy in [place]

## 2019-09-30 NOTE — Telephone Encounter (Signed)
I called pt and he is taking Levaquin and has been taking since 09/24/19. He states that he has developed sores on his tongue and "everything tastes like salt"  he states that he did bring this up at his last visit and had been advised to take a probiotic . He has spoken with the pharmacy and they said he needs an RX for something like magic mouthwash or nystatin  mouthwash and wants to know if you will write for this

## 2019-10-04 LAB — AEROBIC/ANAEROBIC CULTURE W GRAM STAIN (SURGICAL/DEEP WOUND)

## 2019-10-05 ENCOUNTER — Telehealth: Payer: Self-pay | Admitting: Orthopedic Surgery

## 2019-10-05 NOTE — Telephone Encounter (Signed)
Called the patient . Looks like the levofloxin is ok but patient is having trouble tolerating it. ?Change to Bactrim will discuss with Dr. Sharol Given

## 2019-10-05 NOTE — Telephone Encounter (Signed)
Patient called. He said the results of his blood work were supposed to help determine the antibiotic he needs. He is wanting to check on those results and get an update on the antibiotic.   Call back number: [phone]

## 2019-10-05 NOTE — Telephone Encounter (Signed)
Please advise. Do patient needs an antibiotic to take? Patient's results for culture are in. Thank you.

## 2019-10-06 ENCOUNTER — Other Ambulatory Visit: Payer: Self-pay | Admitting: Physician Assistant

## 2019-10-06 MED ORDER — SULFAMETHOXAZOLE-TRIMETHOPRIM 800-160 MG PO TABS: 1.0000 | ORAL_TABLET | Freq: Two times a day (BID) | ORAL | 0 refills | Status: DC

## 2019-10-06 NOTE — Telephone Encounter (Signed)
Changed to bactrim

## 2019-10-07 ENCOUNTER — Encounter: Payer: Self-pay | Admitting: Physician Assistant

## 2019-10-07 ENCOUNTER — Ambulatory Visit (INDEPENDENT_AMBULATORY_CARE_PROVIDER_SITE_OTHER): Payer: PPO | Admitting: Physician Assistant

## 2019-10-07 ENCOUNTER — Other Ambulatory Visit: Payer: Self-pay

## 2019-10-07 VITALS — Ht 72.0 in | Wt 175.0 lb

## 2019-10-07 DIAGNOSIS — M86261 Subacute osteomyelitis, right tibia and fibula: Secondary | ICD-10-CM

## 2019-10-07 NOTE — Progress Notes (Signed)
Office Visit Note   Patient: Bradley Espinoza           Date of Birth: 06/06/1956           MRN: [id number] Visit Date: 10/07/2019              Requested by: Celene Squibb, MD [place],  [place] [id number] PCP: Celene Squibb, MD  Chief Complaint  Patient presents with  . Right Ankle - Routine Post Op    09/29/19 right ankle hard wear removal       HPI: This is a pleasant gentleman who is now 1 week status post right ankle removal of hardware.  We did change his antibiotic to Bactrim yesterday as the levofloxacin was giving him quite a few side effects and the Bactrim is sensitive to the bacteria that was growing in cultures.  He denies fever chills or calf pain  Assessment & Plan: Visit Diagnoses: No diagnosis found.  Plan: He will continue to do daily dressing changes he may get the incisions wet with mild soap and water he is not to soak his foot he is to continue to be nonweightbearing he will follow-up in 1 week.  I also asked that he continue to try to elevate his foot is much as possible  Follow-Up Instructions: No follow-ups on file.   Ortho Exam  Patient is alert, oriented, no adenopathy, well-dressed, normal affect, normal respiratory effort. Focused examination demonstrates moderate soft tissue swelling.  There is some skin maceration although the wound edges look fairly healthy.  He does have some very minor dehiscence is at the ends of the wounds  Imaging: No results found. No images are attached to the encounter.  Labs: Lab Results  Component Value Date   HGBA1C 5.1 06/26/2016   ESRSEDRATE 9 12/08/2018   REPTSTATUS 10/04/2019 FINAL 09/29/2019   GRAMSTAIN  09/29/2019    ABUNDANT WBC PRESENT, PREDOMINANTLY PMN FEW GRAM POSITIVE COCCI    CULT  09/29/2019    FEW STAPHYLOCOCCUS AUREUS NO ANAEROBES ISOLATED Performed at [facility], [place] [address] [place],  [address]    [place] 09/29/2019     Lab  Results  Component Value Date   ALBUMIN 4.4 07/02/2019   ALBUMIN 4.3 08/12/2015   ALBUMIN 4.1 05/06/2015    No results found for: MG No results found for: VD25OH  No results found for: PREALBUMIN CBC EXTENDED Latest Ref Rng & Units 09/29/2019 09/09/2019 07/02/2019  WBC 4.0 - 10.5 K/uL 6.8 6.0 8.0  RBC 4.22 - 5.81 MIL/uL 4.67 5.72 5.22  HGB 13.0 - 17.0 g/dL 13.1 16.4 14.8  HCT 39.0 - 52.0 % 42.0 51.1 47.8  PLT 150 - 400 K/uL 305 232 244  NEUTROABS 1.7 - 7.7 K/uL - - 5.7  LYMPHSABS 0.7 - 4.0 K/uL - - 1.6     Body mass index is 23.73 kg/m.  Orders:  No orders of the defined types were placed in this encounter.  No orders of the defined types were placed in this encounter.    Procedures: No procedures performed  Clinical Data: No additional findings.  ROS:  All other systems negative, except as noted in the HPI. Review of Systems  Objective: Vital Signs: Ht 6' (1.829 m)   Wt 175 lb (79.4 kg)   BMI 23.73 kg/m   Specialty Comments:  No specialty comments available.  PMFS History: Patient Active Problem List   Diagnosis Date Noted  .  Cutaneous abscess of right ankle   . Subacute osteomyelitis of right tibia ([facility])   . Fatty liver 06/30/2019  . Calculus of gallbladder without cholecystitis without obstruction 06/30/2019  . Nausea without vomiting 05/13/2019  . Abdominal bloating 05/13/2019  . Wound infection following procedure 12/19/2018  . Malunion of joint fusion ([facility]) 11/26/2018  . 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[facility])   . Status post lumbar spinal fusion 09/10/2018  . S/P ankle fusion 11/27/2017  . Nonunion of subtalar arthrodesis   . Avascular necrosis of talus, right ([facility])   . Post-traumatic osteoarthritis, left ankle and foot 10/22/2017  . DDD (degenerative disc disease), lumbar 04/05/2017  . Fracture of L2 vertebra ([facility]) 01/12/2015  . Acute osteomyelitis of calcaneum ([facility]) 10/09/2014  . Dysuria 10/05/2014  . Cellulitis 10/05/2014  . Fall from ladder  09/21/2014  . L2 vertebral fracture ([facility]) 09/21/2014  . Bilateral calcaneal fractures 09/21/2014  . Chronic pain 09/21/2014  . Acute blood loss anemia 09/21/2014  . Open fracture of both calcanei 09/15/2014   Past Medical History:  Diagnosis Date  . Anxiety   . Arthritis    low back pain, lumbar radiculopathy  . Depression   . Fracture    B/L ankles  . GERD (gastroesophageal reflux disease)   . Headache(784.0)    allergy related   . History of kidney stones   . History of stomach ulcers   . Retained orthopedic hardware    failed retained hardware right foot  . Wears glasses   . Wears glasses   . Wears partial dentures     Family History  Problem Relation Age of Onset  . Diabetes Father   . Cancer Other     Past Surgical History:  Procedure Laterality Date  . ANKLE FUSION Right 05/11/2015   Procedure: Right Posterior Arthroscopic Subtalar Arthrodesis;  Surgeon: Newt Minion, MD;  Location: [facility];  Service: Orthopedics;  Laterality: Right;  . ANKLE FUSION Right 11/27/2017   Procedure: RIGHT TIBIOCALCANEAL FUSION;  Surgeon: Newt Minion, MD;  Location: [facility];  Service: Orthopedics;  Laterality: Right;  . ANKLE FUSION Right 11/26/2018   Procedure: RIGHT ANTERIOR ANKLE FUSION, APPLY VAC;  Surgeon: Newt Minion, MD;  Location: [facility];  Service: Orthopedics;  Laterality: Right;  . ANKLE FUSION Right 09/09/2019   Procedure: REVISION RIGHT ANKLE FUSION;  Surgeon: Newt Minion, MD;  Location: [facility];  Service: Orthopedics;  Laterality: Right;  . ANTERIOR LAT LUMBAR FUSION N/A 04/05/2017   Procedure: Extreme Lateral Interbody Fusion - Lumbar three-lumbar four ,exploration of fusion Posterior augmentation with globus addition Removal hardware Lumbar one-three. Lumbar four-sacral one,  Removal internal bone growth stimulator;  Surgeon: Kary Kos, MD;  Location: [facility];  Service: Neurosurgery;  Laterality: N/A;  . APPLICATION OF WOUND VAC  12/23/2018   Procedure: Application Of Wound Vac;   Surgeon: Newt Minion, MD;  Location: [facility];  Service: Orthopedics;;  . BACK SURGERY  2004   x 2  . BIOPSY  06/05/2019   Procedure: BIOPSY;  Surgeon: Rogene Houston, MD;  [facility];  Service: Endoscopy;;  gastric biopsy  . CERVICAL SPINE SURGERY  2008  . CHOLECYSTECTOMY N/A 07/06/2019   Procedure: LAPAROSCOPIC CHOLECYSTECTOMY;  Surgeon: Virl Cagey, MD;  [facility];  Service: General;  Laterality: N/A;  . COLONOSCOPY WITH PROPOFOL N/A 06/05/2019   Procedure: COLONOSCOPY WITH PROPOFOL;  Surgeon: Rogene Houston, MD;  [facility];  Service: Endoscopy;  Laterality: N/A;  .  ESOPHAGOGASTRODUODENOSCOPY    . ESOPHAGOGASTRODUODENOSCOPY (EGD) WITH PROPOFOL N/A 06/05/2019   Procedure: ESOPHAGOGASTRODUODENOSCOPY (EGD) WITH PROPOFOL;  Surgeon: Rogene Houston, MD;  [facility];  Service: Endoscopy;  Laterality: N/A;  1220pm  . HARDWARE REMOVAL Right 10/09/2014   Procedure: Removal Deep Hardware, Irrigation and Debridement Calcaneus, Place Antibiotic Beads and Wound VAC ;  Surgeon: Newt Minion, MD;  Location: [facility];  Service: Orthopedics;  Laterality: Right;  . HARDWARE REMOVAL Right 08/12/2015   Procedure: Removal Deep Hardware Right Foot;  Surgeon: Newt Minion, MD;  Location: [facility];  Service: Orthopedics;  Laterality: Right;  . HARDWARE REMOVAL Right 11/26/2018   Procedure: REMOVAL RIGHT TIBIOCALCANEAL NAIL;  Surgeon: Newt Minion, MD;  Location: [facility];  Service: Orthopedics;  Laterality: Right;  . HARDWARE REMOVAL Right 12/23/2018   Procedure: RIGHT ANKLE REMOVE HARDWARE, PLACE ANTIBIOTIC BEADS and placement of wound vac;  Surgeon: Newt Minion, MD;  Location: [facility];  Service: Orthopedics;  Laterality: Right;  . HARDWARE REMOVAL Right 09/29/2019   Procedure: RIGHT ANKLE REMOVE HARDWARE, DEBRIDEMENT;  Surgeon: Newt Minion, MD;  Location: [facility];  Service: Orthopedics;  Laterality: Right;  . I & D EXTREMITY Right 09/15/2014   Procedure: IRRIGATION  AND DEBRIDEMENT Ankle;  Surgeon: Renette Butters, MD;  Location: [facility];  Service: Orthopedics;  Laterality: Right;  . INGUINAL HERNIA REPAIR Bilateral   . LAMINECTOMY WITH POSTERIOR LATERAL ARTHRODESIS LEVEL 4 N/A 09/10/2018   Procedure: Posterior Lateral Fusion - Thoracic Eleven-Thoracic Twelve - Thoracic Twelve-Lumbar One - Lumbar One-Lumbar Two - Lumbar Two-Lumbar Three with instrumentaion and PLA;  Surgeon: Kary Kos, MD;  Location: [facility];  Service: Neurosurgery;  Laterality: N/A;  Posterior Lateral Fusion - Thoracic Eleven-Thoracic Twelve - Thoracic Twelve-Lumbar One - Lumbar One-Lumbar Two - Lumbar Two-Lumbar Thre  . LIVER BIOPSY N/A 07/06/2019   Procedure: LIVER BIOPSY;  Surgeon: Virl Cagey, MD;  [facility];  Service: General;  Laterality: N/A;  . LUMBAR PERCUTANEOUS PEDICLE SCREW 1 LEVEL N/A 04/05/2017   Procedure: LUMBAR PERCUTANEOUS PEDICLE SCREW LUMBAR THREE-FOUR;  Surgeon: Kary Kos, MD;  Location: [facility];  Service: Neurosurgery;  Laterality: N/A;  . ORIF CALCANEOUS FRACTURE Right 09/19/2014   Procedure: OPEN REDUCTION INTERNAL FIXATION (ORIF) CALCANEOUS FRACTURE;  Surgeon: Newt Minion, MD;  Location: [facility];  Service: Orthopedics;  Laterality: Right;  . ORIF CALCANEOUS FRACTURE Left 09/19/2014   Procedure: OPEN REDUCTION INTERNAL FIXATION (ORIF) CALCANEOUS FRACTURE;  Surgeon: Newt Minion, MD;  Location: [facility];  Service: Orthopedics;  Laterality: Left;  . POLYPECTOMY  06/05/2019   Procedure: POLYPECTOMY;  Surgeon: Rogene Houston, MD;  [facility];  Service: Endoscopy;;  cecal polyp biopsy forcep, ascending polyp cold snare   Social History   Occupational History    Comment: disabled  Tobacco Use  . Smoking status: Former Smoker    Packs/day: 1.00    Years: 10.00    Pack years: 10.00    Types: Cigars    Start date: 10/08/1976    Quit date: 09/2017    Years since quitting: 2.0  . Smokeless tobacco: Never Used  . Tobacco comment: quit smoking cegar  09/10/2018  Substance and Sexual Activity  . Alcohol use: No    Comment:  quit 1998  . Drug use: No  . Sexual activity: Yes    Birth control/protection: None

## 2019-10-13 ENCOUNTER — Inpatient Hospital Stay: Payer: PPO | Admitting: Physician Assistant

## 2019-10-15 ENCOUNTER — Encounter: Payer: Self-pay | Admitting: Orthopedic Surgery

## 2019-10-15 ENCOUNTER — Ambulatory Visit: Payer: PPO | Admitting: Orthopedic Surgery

## 2019-10-15 ENCOUNTER — Other Ambulatory Visit: Payer: Self-pay

## 2019-10-15 ENCOUNTER — Ambulatory Visit (INDEPENDENT_AMBULATORY_CARE_PROVIDER_SITE_OTHER): Payer: PPO | Admitting: Orthopedic Surgery

## 2019-10-15 ENCOUNTER — Ambulatory Visit (INDEPENDENT_AMBULATORY_CARE_PROVIDER_SITE_OTHER): Payer: PPO

## 2019-10-15 DIAGNOSIS — Z981 Arthrodesis status: Secondary | ICD-10-CM

## 2019-10-19 ENCOUNTER — Encounter: Payer: Self-pay | Admitting: Orthopedic Surgery

## 2019-10-19 NOTE — Progress Notes (Signed)
Office Visit Note   Patient: Bradley Espinoza           Date of Birth: 03-Nov-1955           MRN: [id number] Visit Date: 10/15/2019              Requested by: Celene Squibb, MD [place],  [place] [address] PCP: Celene Squibb, MD  Chief Complaint  Patient presents with  . Right Foot - Routine Post Op, Follow-up      HPI: Patient is a 64 year old gentleman who presents status post removal of deep retained hardware for cellulitis status post revision fusion of the ankle.  Patient states that his pain is a 9 out of 10 is constant he is using Percocet Neurontin and crutches.  Assessment & Plan: Visit Diagnoses:  1. S/P ankle fusion     Plan: Patient is placed in a short leg nonweightbearing cast.  Discussed the importance of elevation nonweightbearing.  Follow-Up Instructions: Return in about 3 weeks (around 11/05/2019).   Ortho Exam  Patient is alert, oriented, no adenopathy, well-dressed, normal affect, normal respiratory effort. Examination patient is manipulating the fusion site.  The surgical incisions are showing good healing there is no cellulitis no drainage we will harvest the sutures today placed him in a short leg nonweightbearing cast.  Discussed the importance of the ankle not moving in order for the fusion to heal after the hardware was removed.  Imaging: No results found. No images are attached to the encounter.  Labs: Lab Results  Component Value Date   HGBA1C 5.1 06/26/2016   ESRSEDRATE 9 12/08/2018   REPTSTATUS 10/04/2019 FINAL 09/29/2019   GRAMSTAIN  09/29/2019    ABUNDANT WBC PRESENT, PREDOMINANTLY PMN FEW GRAM POSITIVE COCCI    CULT  09/29/2019    FEW STAPHYLOCOCCUS AUREUS NO ANAEROBES ISOLATED Performed at [facility], [place] [address], [place], [place] [address]    LABORGA STAPHYLOCOCCUS AUREUS 09/29/2019     Lab Results  Component Value Date   ALBUMIN 4.4 07/02/2019   ALBUMIN 4.3 08/12/2015   ALBUMIN 4.1 05/06/2015      No results found for: MG No results found for: VD25OH  No results found for: PREALBUMIN CBC EXTENDED Latest Ref Rng & Units 09/29/2019 09/09/2019 07/02/2019  WBC 4.0 - 10.5 K/uL 6.8 6.0 8.0  RBC 4.22 - 5.81 MIL/uL 4.67 5.72 5.22  HGB 13.0 - 17.0 g/dL 13.1 16.4 14.8  HCT 39.0 - 52.0 % 42.0 51.1 47.8  PLT 150 - 400 K/uL 305 232 244  NEUTROABS 1.7 - 7.7 K/uL - - 5.7  LYMPHSABS 0.7 - 4.0 K/uL - - 1.6     There is no height or weight on file to calculate BMI.  Orders:  Orders Placed This Encounter  Procedures  . XR Ankle Complete Right   No orders of the defined types were placed in this encounter.    Procedures: No procedures performed  Clinical Data: No additional findings.  ROS:  All other systems negative, except as noted in the HPI. Review of Systems  Objective: Vital Signs: There were no vitals taken for this visit.  Specialty Comments:  No specialty comments available.  PMFS History: Patient Active Problem List   Diagnosis Date Noted  . Cutaneous abscess of right ankle   . Subacute osteomyelitis of right tibia ([place])   . Fatty liver 06/30/2019  . Calculus of gallbladder without cholecystitis without obstruction 06/30/2019  . Nausea without  vomiting 05/13/2019  . Abdominal bloating 05/13/2019  . Wound infection following procedure 12/19/2018  . Malunion of joint fusion ([place]) 11/26/2018  . 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[facility])   . Status post lumbar spinal fusion 09/10/2018  . S/P ankle fusion 11/27/2017  . Nonunion of subtalar arthrodesis   . Avascular necrosis of talus, right ([place])   . Post-traumatic osteoarthritis, left ankle and foot 10/22/2017  . DDD (degenerative disc disease), lumbar 04/05/2017  . Fracture of L2 vertebra ([place]) 01/12/2015  . Acute osteomyelitis of calcaneum ([place]) 10/09/2014  . Dysuria 10/05/2014  . Cellulitis 10/05/2014  . Fall from ladder 09/21/2014  . L2 vertebral fracture ([place]) 09/21/2014  . Bilateral calcaneal fractures 09/21/2014   . Chronic pain 09/21/2014  . Acute blood loss anemia 09/21/2014  . Open fracture of both calcanei 09/15/2014   Past Medical History:  Diagnosis Date  . Anxiety   . Arthritis    low back pain, lumbar radiculopathy  . Depression   . Fracture    B/L ankles  . GERD (gastroesophageal reflux disease)   . Headache(784.0)    allergy related   . History of kidney stones   . History of stomach ulcers   . Retained orthopedic hardware    failed retained hardware right foot  . Wears glasses   . Wears glasses   . Wears partial dentures     Family History  Problem Relation Age of Onset  . Diabetes Father   . Cancer Other     Past Surgical History:  Procedure Laterality Date  . ANKLE FUSION Right 05/11/2015   Procedure: Right Posterior Arthroscopic Subtalar Arthrodesis;  Surgeon: Newt Minion, MD;  Location: [place];  Service: Orthopedics;  Laterality: Right;  . ANKLE FUSION Right 11/27/2017   Procedure: RIGHT TIBIOCALCANEAL FUSION;  Surgeon: Newt Minion, MD;  Location: [place];  Service: Orthopedics;  Laterality: Right;  . ANKLE FUSION Right 11/26/2018   Procedure: RIGHT ANTERIOR ANKLE FUSION, APPLY VAC;  Surgeon: Newt Minion, MD;  Location: [place];  Service: Orthopedics;  Laterality: Right;  . ANKLE FUSION Right 09/09/2019   Procedure: REVISION RIGHT ANKLE FUSION;  Surgeon: Newt Minion, MD;  Location: [place];  Service: Orthopedics;  Laterality: Right;  . ANTERIOR LAT LUMBAR FUSION N/A 04/05/2017   Procedure: Extreme Lateral Interbody Fusion - Lumbar three-lumbar four ,exploration of fusion Posterior augmentation with globus addition Removal hardware Lumbar one-three. Lumbar four-sacral one,  Removal internal bone growth stimulator;  Surgeon: Kary Kos, MD;  Location: [facility];  Service: Neurosurgery;  Laterality: N/A;  . APPLICATION OF WOUND VAC  12/23/2018   Procedure: Application Of Wound Vac;  Surgeon: Newt Minion, MD;  Location: [place];  Service: Orthopedics;;  . BACK SURGERY  2004    x 2  . BIOPSY  06/05/2019   Procedure: BIOPSY;  Surgeon: Rogene Houston, MD;  [facility];  Service: Endoscopy;;  gastric biopsy  . CERVICAL SPINE SURGERY  2008  . CHOLECYSTECTOMY N/A 07/06/2019   Procedure: LAPAROSCOPIC CHOLECYSTECTOMY;  Surgeon: Virl Cagey, MD;  [facility];  Service: General;  Laterality: N/A;  . COLONOSCOPY WITH PROPOFOL N/A 06/05/2019   Procedure: COLONOSCOPY WITH PROPOFOL;  Surgeon: Rogene Houston, MD;  [facility];  Service: Endoscopy;  Laterality: N/A;  . ESOPHAGOGASTRODUODENOSCOPY    . ESOPHAGOGASTRODUODENOSCOPY (EGD) WITH PROPOFOL N/A 06/05/2019   Procedure: ESOPHAGOGASTRODUODENOSCOPY (EGD) WITH PROPOFOL;  Surgeon: Rogene Houston, MD;  [facility];  Service: Endoscopy;  Laterality: N/A;  1220pm  . HARDWARE REMOVAL Right 10/09/2014   Procedure: Removal Deep Hardware, Irrigation and Debridement Calcaneus, Place Antibiotic Beads and Wound VAC ;  Surgeon: Newt Minion, MD;  Location: [facility];  Service: Orthopedics;  Laterality: Right;  . HARDWARE REMOVAL Right 08/12/2015   Procedure: Removal Deep Hardware Right Foot;  Surgeon: Newt Minion, MD;  Location: [place] [facility];  Service: Orthopedics;  Laterality: Right;  . HARDWARE REMOVAL Right 11/26/2018   Procedure: REMOVAL RIGHT TIBIOCALCANEAL NAIL;  Surgeon: Newt Minion, MD;  Location: [facility];  Service: Orthopedics;  Laterality: Right;  . HARDWARE REMOVAL Right 12/23/2018   Procedure: RIGHT ANKLE REMOVE HARDWARE, PLACE ANTIBIOTIC BEADS and placement of wound vac;  Surgeon: Newt Minion, MD;  Location: [facility];  Service: Orthopedics;  Laterality: Right;  . HARDWARE REMOVAL Right 09/29/2019   Procedure: RIGHT ANKLE REMOVE HARDWARE, DEBRIDEMENT;  Surgeon: Newt Minion, MD;  Location: [facility];  Service: Orthopedics;  Laterality: Right;  . I & D EXTREMITY Right 09/15/2014   Procedure: IRRIGATION AND DEBRIDEMENT Ankle;  Surgeon: Renette Butters, MD;  Location: [place] [facility] [place];  Service: Orthopedics;   Laterality: Right;  . INGUINAL HERNIA REPAIR Bilateral   . LAMINECTOMY WITH POSTERIOR LATERAL ARTHRODESIS LEVEL 4 N/A 09/10/2018   Procedure: Posterior Lateral Fusion - Thoracic Eleven-Thoracic Twelve - Thoracic Twelve-Lumbar One - Lumbar One-Lumbar Two - Lumbar Two-Lumbar Three with instrumentaion and PLA;  Surgeon: Kary Kos, MD;  Location: [facility] [place];  Service: Neurosurgery;  Laterality: N/A;  Posterior Lateral Fusion - Thoracic Eleven-Thoracic Twelve - Thoracic Twelve-Lumbar One - Lumbar One-Lumbar Two - Lumbar Two-Lumbar Thre  . LIVER BIOPSY N/A 07/06/2019   Procedure: LIVER BIOPSY;  Surgeon: Virl Cagey, MD;  [facility];  Service: General;  Laterality: N/A;  . LUMBAR PERCUTANEOUS PEDICLE SCREW 1 LEVEL N/A 04/05/2017   Procedure: LUMBAR PERCUTANEOUS PEDICLE SCREW LUMBAR THREE-FOUR;  Surgeon: Kary Kos, MD;  Location: [facility];  Service: Neurosurgery;  Laterality: N/A;  . ORIF CALCANEOUS FRACTURE Right 09/19/2014   Procedure: OPEN REDUCTION INTERNAL FIXATION (ORIF) CALCANEOUS FRACTURE;  Surgeon: Newt Minion, MD;  Location: [facility];  Service: Orthopedics;  Laterality: Right;  . ORIF CALCANEOUS FRACTURE Left 09/19/2014   Procedure: OPEN REDUCTION INTERNAL FIXATION (ORIF) CALCANEOUS FRACTURE;  Surgeon: Newt Minion, MD;  Location: [place];  Service: Orthopedics;  Laterality: Left;  . POLYPECTOMY  06/05/2019   Procedure: POLYPECTOMY;  Surgeon: Rogene Houston, MD;  [facility];  Service: Endoscopy;;  cecal polyp biopsy forcep, ascending polyp cold snare   Social History   Occupational History    Comment: disabled  Tobacco Use  . Smoking status: Former Smoker    Packs/day: 1.00    Years: 10.00    Pack years: 10.00    Types: Cigars    Start date: 10/08/1976    Quit date: 09/2017    Years since quitting: 2.1  . Smokeless tobacco: Never Used  . Tobacco comment: quit smoking cegar 09/10/2018  Substance and Sexual Activity  . Alcohol use: No    Comment:  quit 1998  . Drug use:  No  . Sexual activity: Yes    Birth control/protection: None

## 2019-10-25 IMAGING — RF DG C-ARM 61-120 MIN
1 series · 2 of 2 positions shown · non-contrast
Comparison: CT from 05/09/2018

CLINICAL DATA: T11-L3 PLIF

EXAM:
LUMBAR SPINE - 2-3 VIEW; DG C-ARM 61-120 MIN

[Series 1: run · 2 of 2 slices shown]
[im 1/2]
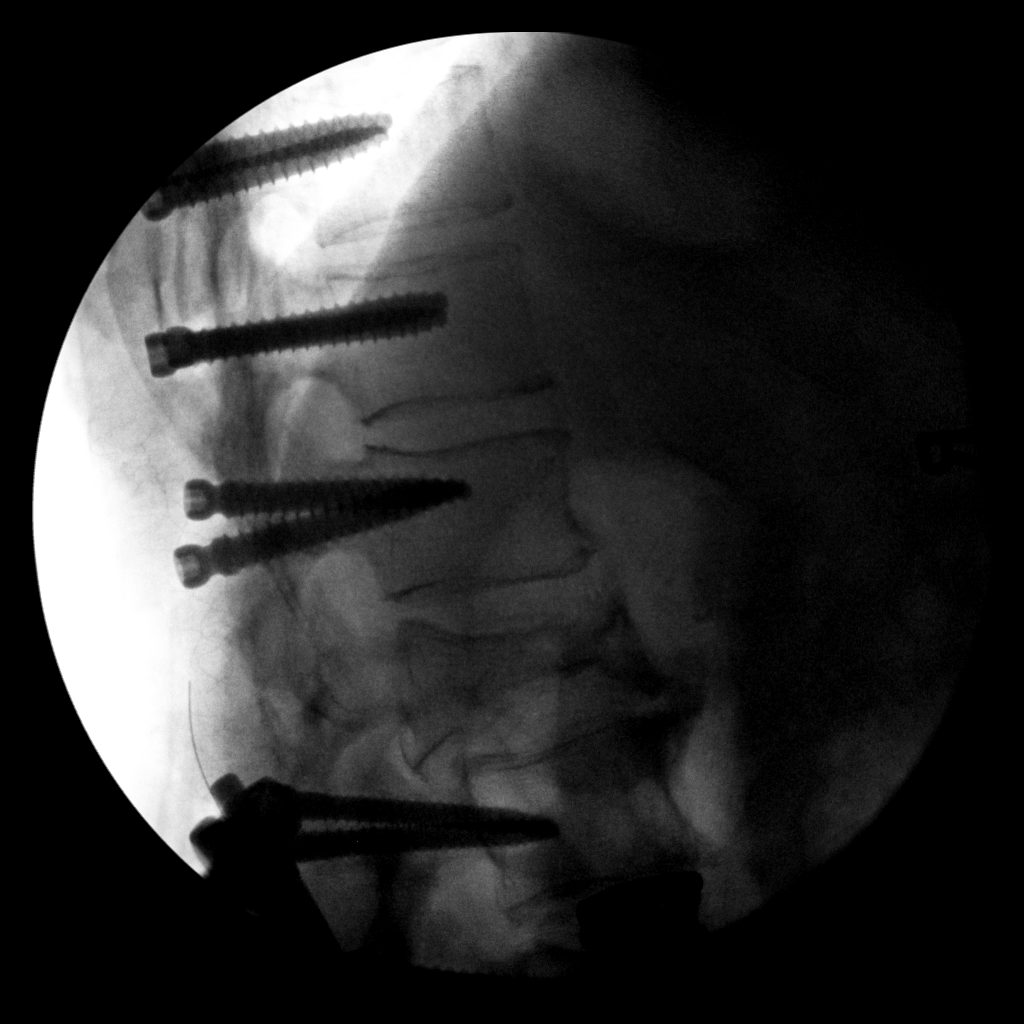
[im 2/2]
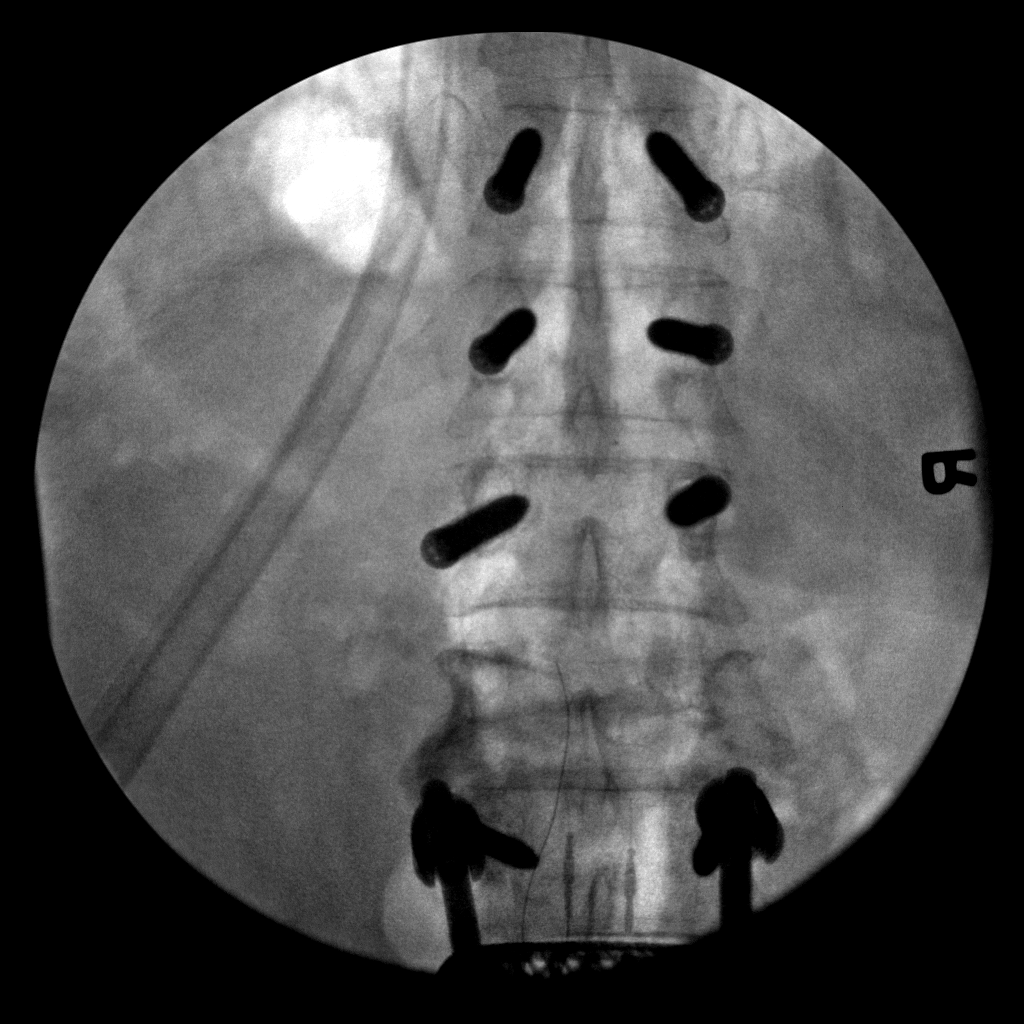

[2 of 2 positions shown; findings below may reference images not displayed]

FLUOROSCOPY TIME:  Fluoroscopy Time:  45 seconds

Radiation Exposure Index (if provided by the fluoroscopic device):
Not available

Number of Acquired Spot Images: 2
FINDINGS: Pedicle screws are noted at T11, T12 and L1. Previously seen
fixation at L3 is noted. Chronic L2 burst fracture is noted.
IMPRESSION: Pedicle screws from T11-L1 with chronic L2 compression fracture

## 2019-11-05 ENCOUNTER — Ambulatory Visit (INDEPENDENT_AMBULATORY_CARE_PROVIDER_SITE_OTHER): Payer: PPO

## 2019-11-05 ENCOUNTER — Ambulatory Visit (INDEPENDENT_AMBULATORY_CARE_PROVIDER_SITE_OTHER): Payer: PPO | Admitting: Orthopedic Surgery

## 2019-11-05 ENCOUNTER — Encounter: Payer: Self-pay | Admitting: Orthopedic Surgery

## 2019-11-05 ENCOUNTER — Other Ambulatory Visit: Payer: Self-pay

## 2019-11-05 DIAGNOSIS — Z981 Arthrodesis status: Secondary | ICD-10-CM

## 2019-11-09 ENCOUNTER — Encounter: Payer: Self-pay | Admitting: Orthopedic Surgery

## 2019-11-09 NOTE — Progress Notes (Signed)
Office Visit Note   Patient: Bradley Espinoza           Date of Birth: January 06, 1956           MRN: [id number] Visit Date: 11/05/2019              Requested by: Celene Squibb, MD [place],  [place] [address] PCP: Celene Squibb, MD  Chief Complaint  Patient presents with  . Right Ankle - Routine Post Op      HPI: Patient is a 64 year old gentleman who presents in follow-up for tibial calcaneal fusion.  Most recently the revision fusion shows signs of infection with the screws and the screws were removed.  Patient is now being treated in a cast to try to consolidate the fibrous union of the tibiotalar joint.  Patient is still nonweightbearing.  Assessment & Plan: Visit Diagnoses:  1. S/P ankle fusion     Plan: Will reapply a short leg cast again discussed the importance of not moving the ankle to allow for stable union.  Again reviewed that we cannot use hardware to stabilize this fusion secondary to previous complications with deep retained hardware.  Discussed the possibility of external fixation but feel the cast is the safest for the patient.  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  Follow-Up Instructions: Return in about 4 weeks (around 12/03/2019).   Ortho Exam  Patient is alert, oriented, no adenopathy, well-dressed, normal affect, normal respiratory effort. Examination there is no swelling no cellulitis the incisions are well-healed patient does not have a stable union of the tibiotalar joint there is motion.  Patient's ankle is at 90 degrees and his foot is plantigrade.  Imaging: No results found. No images are attached to the encounter.  Labs: Lab Results  Component Value Date   HGBA1C 5.1 06/26/2016   ESRSEDRATE 9 12/08/2018   REPTSTATUS 10/04/2019 FINAL 09/29/2019   GRAMSTAIN  09/29/2019    ABUNDANT WBC PRESENT, PREDOMINANTLY PMN FEW GRAM POSITIVE COCCI    CULT  09/29/2019    FEW STAPHYLOCOCCUS AUREUS NO ANAEROBES  ISOLATED Performed at [facility], [place] [address] [place], [place] [address]    LABORGA STAPHYLOCOCCUS AUREUS 09/29/2019     Lab Results  Component Value Date   ALBUMIN 4.4 07/02/2019   ALBUMIN 4.3 08/12/2015   ALBUMIN 4.1 05/06/2015    No results found for: MG No results found for: VD25OH  No results found for: PREALBUMIN CBC EXTENDED Latest Ref Rng & Units 09/29/2019 09/09/2019 07/02/2019  WBC 4.0 - 10.5 K/uL 6.8 6.0 8.0  RBC 4.22 - 5.81 MIL/uL 4.67 5.72 5.22  HGB 13.0 - 17.0 g/dL 13.1 16.4 14.8  HCT 39.0 - 52.0 % 42.0 51.1 47.8  PLT 150 - 400 K/uL 305 232 244  NEUTROABS 1.7 - 7.7 K/uL - - 5.7  LYMPHSABS 0.7 - 4.0 K/uL - - 1.6     There is no height or weight on file to calculate BMI.  Orders:  Orders Placed This Encounter  Procedures  . XR Ankle Complete Right   No orders of the defined types were placed in this encounter.    Procedures: No procedures performed  Clinical Data: No additional findings.  ROS:  All other systems negative, except as noted in the HPI. Review of Systems  Objective: Vital Signs: There were no vitals taken for this visit.  Specialty Comments:  No specialty comments available.  PMFS History:  Patient Active Problem List   Diagnosis Date Noted  . Cutaneous abscess of right ankle   . Subacute osteomyelitis of right tibia ([place])   . Fatty liver 06/30/2019  . Calculus of gallbladder without cholecystitis without obstruction 06/30/2019  . Nausea without vomiting 05/13/2019  . Abdominal bloating 05/13/2019  . Wound infection following procedure 12/19/2018  . Malunion of joint fusion ([place]) 11/26/2018  . 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[facility])   . Status post lumbar spinal fusion 09/10/2018  . S/P ankle fusion 11/27/2017  . Nonunion of subtalar arthrodesis   . Avascular necrosis of talus, right ([place])   . Post-traumatic osteoarthritis, left ankle and foot 10/22/2017  . DDD (degenerative disc disease), lumbar 04/05/2017  . Fracture of  L2 vertebra ([place]) 01/12/2015  . Acute osteomyelitis of calcaneum ([place]) 10/09/2014  . Dysuria 10/05/2014  . Cellulitis 10/05/2014  . Fall from ladder 09/21/2014  . L2 vertebral fracture ([facility]) 09/21/2014  . Bilateral calcaneal fractures 09/21/2014  . Chronic pain 09/21/2014  . Acute blood loss anemia 09/21/2014  . Open fracture of both calcanei 09/15/2014   Past Medical History:  Diagnosis Date  . Anxiety   . Arthritis    low back pain, lumbar radiculopathy  . Depression   . Fracture    B/L ankles  . GERD (gastroesophageal reflux disease)   . Headache(784.0)    allergy related   . History of kidney stones   . History of stomach ulcers   . Retained orthopedic hardware    failed retained hardware right foot  . Wears glasses   . Wears glasses   . Wears partial dentures     Family History  Problem Relation Age of Onset  . Diabetes Father   . Cancer Other     Past Surgical History:  Procedure Laterality Date  . ANKLE FUSION Right 05/11/2015   Procedure: Right Posterior Arthroscopic Subtalar Arthrodesis;  Surgeon: Newt Minion, MD;  Location: [facility];  Service: Orthopedics;  Laterality: Right;  . ANKLE FUSION Right 11/27/2017   Procedure: RIGHT TIBIOCALCANEAL FUSION;  Surgeon: Newt Minion, MD;  Location: [facility];  Service: Orthopedics;  Laterality: Right;  . ANKLE FUSION Right 11/26/2018   Procedure: RIGHT ANTERIOR ANKLE FUSION, APPLY VAC;  Surgeon: Newt Minion, MD;  Location: [facility];  Service: Orthopedics;  Laterality: Right;  . ANKLE FUSION Right 09/09/2019   Procedure: REVISION RIGHT ANKLE FUSION;  Surgeon: Newt Minion, MD;  Location: [facility];  Service: Orthopedics;  Laterality: Right;  . ANTERIOR LAT LUMBAR FUSION N/A 04/05/2017   Procedure: Extreme Lateral Interbody Fusion - Lumbar three-lumbar four ,exploration of fusion Posterior augmentation with globus addition Removal hardware Lumbar one-three. Lumbar four-sacral one,  Removal internal bone growth stimulator;  Surgeon:  Kary Kos, MD;  Location: [facility];  Service: Neurosurgery;  Laterality: N/A;  . APPLICATION OF WOUND VAC  12/23/2018   Procedure: Application Of Wound Vac;  Surgeon: Newt Minion, MD;  Location: [facility];  Service: Orthopedics;;  . BACK SURGERY  2004   x 2  . BIOPSY  06/05/2019   Procedure: BIOPSY;  Surgeon: Rogene Houston, MD;  [facility];  Service: Endoscopy;;  gastric biopsy  . CERVICAL SPINE SURGERY  2008  . CHOLECYSTECTOMY N/A 07/06/2019   Procedure: LAPAROSCOPIC CHOLECYSTECTOMY;  Surgeon: Virl Cagey, MD;  [facility];  Service: General;  Laterality: N/A;  . COLONOSCOPY WITH PROPOFOL N/A 06/05/2019   Procedure: COLONOSCOPY WITH PROPOFOL;  Surgeon: Rogene Houston, MD;  [facility];  Service: Endoscopy;  Laterality: N/A;  . ESOPHAGOGASTRODUODENOSCOPY    . ESOPHAGOGASTRODUODENOSCOPY (EGD) WITH PROPOFOL N/A 06/05/2019   Procedure: ESOPHAGOGASTRODUODENOSCOPY (EGD) WITH PROPOFOL;  Surgeon: Rogene Houston, MD;  [facility];  Service: Endoscopy;  Laterality: N/A;  1220pm  . HARDWARE REMOVAL Right 10/09/2014   Procedure: Removal Deep Hardware, Irrigation and Debridement Calcaneus, Place Antibiotic Beads and Wound VAC ;  Surgeon: Newt Minion, MD;  Location: [facility];  Service: Orthopedics;  Laterality: Right;  . HARDWARE REMOVAL Right 08/12/2015   Procedure: Removal Deep Hardware Right Foot;  Surgeon: Newt Minion, MD;  Location: [facility];  Service: Orthopedics;  Laterality: Right;  . HARDWARE REMOVAL Right 11/26/2018   Procedure: REMOVAL RIGHT TIBIOCALCANEAL NAIL;  Surgeon: Newt Minion, MD;  Location: [facility];  Service: Orthopedics;  Laterality: Right;  . HARDWARE REMOVAL Right 12/23/2018   Procedure: RIGHT ANKLE REMOVE HARDWARE, PLACE ANTIBIOTIC BEADS and placement of wound vac;  Surgeon: Newt Minion, MD;  Location: [facility];  Service: Orthopedics;  Laterality: Right;  . HARDWARE REMOVAL Right 09/29/2019   Procedure: RIGHT ANKLE REMOVE HARDWARE,  DEBRIDEMENT;  Surgeon: Newt Minion, MD;  Location: [facility];  Service: Orthopedics;  Laterality: Right;  . I & D EXTREMITY Right 09/15/2014   Procedure: IRRIGATION AND DEBRIDEMENT Ankle;  Surgeon: Renette Butters, MD;  Location: [facility];  Service: Orthopedics;  Laterality: Right;  . INGUINAL HERNIA REPAIR Bilateral   . LAMINECTOMY WITH POSTERIOR LATERAL ARTHRODESIS LEVEL 4 N/A 09/10/2018   Procedure: Posterior Lateral Fusion - Thoracic Eleven-Thoracic Twelve - Thoracic Twelve-Lumbar One - Lumbar One-Lumbar Two - Lumbar Two-Lumbar Three with instrumentaion and PLA;  Surgeon: Kary Kos, MD;  Location: [facility];  Service: Neurosurgery;  Laterality: N/A;  Posterior Lateral Fusion - Thoracic Eleven-Thoracic Twelve - Thoracic Twelve-Lumbar One - Lumbar One-Lumbar Two - Lumbar Two-Lumbar Thre  . LIVER BIOPSY N/A 07/06/2019   Procedure: LIVER BIOPSY;  Surgeon: Virl Cagey, MD;  [facility];  Service: General;  Laterality: N/A;  . LUMBAR PERCUTANEOUS PEDICLE SCREW 1 LEVEL N/A 04/05/2017   Procedure: LUMBAR PERCUTANEOUS PEDICLE SCREW LUMBAR THREE-FOUR;  Surgeon: Kary Kos, MD;  Location: [facility];  Service: Neurosurgery;  Laterality: N/A;  . ORIF CALCANEOUS FRACTURE Right 09/19/2014   Procedure: OPEN REDUCTION INTERNAL FIXATION (ORIF) CALCANEOUS FRACTURE;  Surgeon: Newt Minion, MD;  Location: [facility];  Service: Orthopedics;  Laterality: Right;  . ORIF CALCANEOUS FRACTURE Left 09/19/2014   Procedure: OPEN REDUCTION INTERNAL FIXATION (ORIF) CALCANEOUS FRACTURE;  Surgeon: Newt Minion, MD;  Location: [facility];  Service: Orthopedics;  Laterality: Left;  . POLYPECTOMY  06/05/2019   Procedure: POLYPECTOMY;  Surgeon: Rogene Houston, MD;  [facility];  Service: Endoscopy;;  cecal polyp biopsy forcep, ascending polyp cold snare   Social History   Occupational History    Comment: disabled  Tobacco Use  . Smoking status: Former Smoker    Packs/day: 1.00    Years: 10.00    Pack years: 10.00     Types: Cigars    Start date: 10/08/1976    Quit date: 09/2017    Years since quitting: 2.1  . Smokeless tobacco: Never Used  . Tobacco comment: quit smoking cegar 09/10/2018  Substance and Sexual Activity  . Alcohol use: No    Comment:  quit 1998  . Drug use: No  . Sexual activity: Yes    Birth control/protection: None

## 2019-11-16 DIAGNOSIS — N400 Enlarged prostate without lower urinary tract symptoms: Secondary | ICD-10-CM | POA: Diagnosis not present

## 2019-11-16 DIAGNOSIS — R062 Wheezing: Secondary | ICD-10-CM | POA: Diagnosis not present

## 2019-11-23 DIAGNOSIS — Z981 Arthrodesis status: Secondary | ICD-10-CM | POA: Diagnosis not present

## 2019-11-23 DIAGNOSIS — M47816 Spondylosis without myelopathy or radiculopathy, lumbar region: Secondary | ICD-10-CM | POA: Diagnosis not present

## 2019-11-23 DIAGNOSIS — Z79899 Other long term (current) drug therapy: Secondary | ICD-10-CM | POA: Diagnosis not present

## 2019-11-23 DIAGNOSIS — Z79891 Long term (current) use of opiate analgesic: Secondary | ICD-10-CM | POA: Diagnosis not present

## 2019-11-23 DIAGNOSIS — Z5181 Encounter for therapeutic drug level monitoring: Secondary | ICD-10-CM | POA: Diagnosis not present

## 2019-11-30 DIAGNOSIS — E782 Mixed hyperlipidemia: Secondary | ICD-10-CM | POA: Diagnosis not present

## 2019-11-30 DIAGNOSIS — R5383 Other fatigue: Secondary | ICD-10-CM | POA: Diagnosis not present

## 2019-11-30 DIAGNOSIS — R140 Abdominal distension (gaseous): Secondary | ICD-10-CM | POA: Diagnosis not present

## 2019-11-30 DIAGNOSIS — J039 Acute tonsillitis, unspecified: Secondary | ICD-10-CM | POA: Diagnosis not present

## 2019-11-30 DIAGNOSIS — Z77018 Contact with and (suspected) exposure to other hazardous metals: Secondary | ICD-10-CM | POA: Diagnosis not present

## 2019-11-30 DIAGNOSIS — E291 Testicular hypofunction: Secondary | ICD-10-CM | POA: Diagnosis not present

## 2019-11-30 DIAGNOSIS — L0291 Cutaneous abscess, unspecified: Secondary | ICD-10-CM | POA: Diagnosis not present

## 2019-11-30 DIAGNOSIS — J029 Acute pharyngitis, unspecified: Secondary | ICD-10-CM | POA: Diagnosis not present

## 2019-11-30 DIAGNOSIS — M5136 Other intervertebral disc degeneration, lumbar region: Secondary | ICD-10-CM | POA: Diagnosis not present

## 2019-11-30 DIAGNOSIS — Z0000 Encounter for general adult medical examination without abnormal findings: Secondary | ICD-10-CM | POA: Diagnosis not present

## 2019-11-30 DIAGNOSIS — F5221 Male erectile disorder: Secondary | ICD-10-CM | POA: Diagnosis not present

## 2019-11-30 DIAGNOSIS — S6991XA Unspecified injury of right wrist, hand and finger(s), initial encounter: Secondary | ICD-10-CM | POA: Diagnosis not present

## 2019-12-03 ENCOUNTER — Ambulatory Visit (INDEPENDENT_AMBULATORY_CARE_PROVIDER_SITE_OTHER): Payer: PPO | Admitting: Orthopedic Surgery

## 2019-12-03 ENCOUNTER — Encounter: Payer: Self-pay | Admitting: Orthopedic Surgery

## 2019-12-03 ENCOUNTER — Other Ambulatory Visit: Payer: Self-pay

## 2019-12-03 VITALS — Ht 72.0 in | Wt 175.0 lb

## 2019-12-03 DIAGNOSIS — Z981 Arthrodesis status: Secondary | ICD-10-CM

## 2019-12-07 ENCOUNTER — Encounter: Payer: Self-pay | Admitting: Orthopedic Surgery

## 2019-12-07 DIAGNOSIS — N400 Enlarged prostate without lower urinary tract symptoms: Secondary | ICD-10-CM | POA: Diagnosis not present

## 2019-12-07 DIAGNOSIS — K219 Gastro-esophageal reflux disease without esophagitis: Secondary | ICD-10-CM | POA: Diagnosis not present

## 2019-12-07 DIAGNOSIS — G894 Chronic pain syndrome: Secondary | ICD-10-CM | POA: Diagnosis not present

## 2019-12-07 DIAGNOSIS — Z0001 Encounter for general adult medical examination with abnormal findings: Secondary | ICD-10-CM | POA: Diagnosis not present

## 2019-12-07 DIAGNOSIS — E291 Testicular hypofunction: Secondary | ICD-10-CM | POA: Diagnosis not present

## 2019-12-07 DIAGNOSIS — F5221 Male erectile disorder: Secondary | ICD-10-CM | POA: Diagnosis not present

## 2019-12-07 NOTE — Progress Notes (Signed)
Office Visit Note   Patient: Bradley Espinoza           Date of Birth: 1956/03/25           MRN: [id number] Visit Date: 12/03/2019              Requested by: Celene Squibb, MD [place],  [place] [address] PCP: Celene Squibb, MD  Chief Complaint  Patient presents with  . 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  He has been in a short leg cast and a fracture boot he states he still has pain in the right foot and ankle.  Patient also complains of pain in the subtalar joint on the left from his calcaneus fracture.  Assessment & Plan: Visit Diagnoses: No diagnosis found.  Plan: Will place patient back in a short leg cast for 4 weeks.  Discussed transtibial amputation as an option to resolve the pain.  Follow-Up Instructions: Return in about 4 weeks (around 12/31/2019).   Ortho Exam  Patient is alert, oriented, no adenopathy, well-dressed, normal affect, normal respiratory effort. Examination of the right foot and ankle there is no redness no swelling no cellulitis his foot is plantigrade there is less motion of the ankle joint fibrous nonunion.  Imaging: No results found. No images are attached to the encounter.  Labs: Lab Results  Component Value Date   HGBA1C 5.1 06/26/2016   ESRSEDRATE 9 12/08/2018   REPTSTATUS 10/04/2019 FINAL 09/29/2019   GRAMSTAIN  09/29/2019    ABUNDANT WBC PRESENT, PREDOMINANTLY PMN FEW GRAM POSITIVE COCCI    CULT  09/29/2019    FEW STAPHYLOCOCCUS AUREUS NO ANAEROBES ISOLATED Performed at [facility], [place] [address], [place], [place] [address]    [place] 09/29/2019     Lab Results  Component Value Date   ALBUMIN 4.4 07/02/2019   ALBUMIN 4.3 08/12/2015   ALBUMIN 4.1 05/06/2015    No results found for: MG No results found for:  VD25OH  No results found for: PREALBUMIN CBC EXTENDED Latest Ref Rng & Units 09/29/2019 09/09/2019 07/02/2019  WBC 4.0 - 10.5 K/uL 6.8 6.0 8.0  RBC 4.22 - 5.81 MIL/uL 4.67 5.72 5.22  HGB 13.0 - 17.0 g/dL 13.1 16.4 14.8  HCT 39.0 - 52.0 % 42.0 51.1 47.8  PLT 150 - 400 K/uL 305 232 244  NEUTROABS 1.7 - 7.7 K/uL - - 5.7  LYMPHSABS 0.7 - 4.0 K/uL - - 1.6     Body mass index is 23.73 kg/m.  Orders:  No orders of the defined types were placed in this encounter.  No orders of the defined types were placed in this encounter.    Procedures: No procedures performed  Clinical Data: No additional findings.  ROS:  All other systems negative, except as noted in the HPI. Review of Systems  Objective: Vital Signs: Ht 6' (1.829 m)   Wt 175 lb (79.4 kg)   BMI 23.73 kg/m   Specialty Comments:  No specialty comments available.  PMFS History: Patient Active Problem List   Diagnosis Date Noted  . Cutaneous abscess of right ankle   . Subacute osteomyelitis of right tibia ([place])   . Fatty liver 06/30/2019  . Calculus of gallbladder without cholecystitis without obstruction 06/30/2019  . Nausea without  vomiting 05/13/2019  . Abdominal bloating 05/13/2019  . Wound infection following procedure 12/19/2018  . Malunion of joint fusion ([place]) 11/26/2018  . 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H[facility])   . Status post lumbar spinal fusion 09/10/2018  . S/P ankle fusion 11/27/2017  . Nonunion of subtalar arthrodesis   . Avascular necrosis of talus, right ([place])   . Post-traumatic osteoarthritis, left ankle and foot 10/22/2017  . DDD (degenerative disc disease), lumbar 04/05/2017  . Fracture of L2 vertebra ([place]) 01/12/2015  . Acute osteomyelitis of calcaneum ([place]) 10/09/2014  . Dysuria 10/05/2014  . Cellulitis 10/05/2014  . Fall from ladder 09/21/2014  . L2 vertebral fracture ([place]) 09/21/2014  . Bilateral calcaneal fractures 09/21/2014  . Chronic pain 09/21/2014  . Acute blood loss anemia 09/21/2014  .  Open fracture of both calcanei 09/15/2014   Past Medical History:  Diagnosis Date  . Anxiety   . Arthritis    low back pain, lumbar radiculopathy  . Depression   . Fracture    B/L ankles  . GERD (gastroesophageal reflux disease)   . Headache(784.0)    allergy related   . History of kidney stones   . History of stomach ulcers   . Retained orthopedic hardware    failed retained hardware right foot  . Wears glasses   . Wears glasses   . Wears partial dentures     Family History  Problem Relation Age of Onset  . Diabetes Father   . Cancer Other     Past Surgical History:  Procedure Laterality Date  . ANKLE FUSION Right 05/11/2015   Procedure: Right Posterior Arthroscopic Subtalar Arthrodesis;  Surgeon: Newt Minion, MD;  Location: [place];  Service: Orthopedics;  Laterality: Right;  . ANKLE FUSION Right 11/27/2017   Procedure: RIGHT TIBIOCALCANEAL FUSION;  Surgeon: Newt Minion, MD;  Location: [place];  Service: Orthopedics;  Laterality: Right;  . ANKLE FUSION Right 11/26/2018   Procedure: RIGHT ANTERIOR ANKLE FUSION, APPLY VAC;  Surgeon: Newt Minion, MD;  Location: [place];  Service: Orthopedics;  Laterality: Right;  . ANKLE FUSION Right 09/09/2019   Procedure: REVISION RIGHT ANKLE FUSION;  Surgeon: Newt Minion, MD;  Location: [place];  Service: Orthopedics;  Laterality: Right;  . ANTERIOR LAT LUMBAR FUSION N/A 04/05/2017   Procedure: Extreme Lateral Interbody Fusion - Lumbar three-lumbar four ,exploration of fusion Posterior augmentation with globus addition Removal hardware Lumbar one-three. Lumbar four-sacral one,  Removal internal bone growth stimulator;  Surgeon: Kary Kos, MD;  Location: [place];  Service: Neurosurgery;  Laterality: N/A;  . APPLICATION OF WOUND VAC  12/23/2018   Procedure: Application Of Wound Vac;  Surgeon: Newt Minion, MD;  Location: [place];  Service: Orthopedics;;  . BACK SURGERY  2004   x 2  . BIOPSY  06/05/2019   Procedure: BIOPSY;  Surgeon: Rogene Houston, MD;  [facility];  Service: Endoscopy;;  gastric biopsy  . CERVICAL SPINE SURGERY  2008  . CHOLECYSTECTOMY N/A 07/06/2019   Procedure: LAPAROSCOPIC CHOLECYSTECTOMY;  Surgeon: Virl Cagey, MD;  [facility];  Service: General;  Laterality: N/A;  . COLONOSCOPY WITH PROPOFOL N/A 06/05/2019   Procedure: COLONOSCOPY WITH PROPOFOL;  Surgeon: Rogene Houston, MD;  [facility];  Service: Endoscopy;  Laterality: N/A;  . ESOPHAGOGASTRODUODENOSCOPY    . ESOPHAGOGASTRODUODENOSCOPY (EGD) WITH PROPOFOL N/A 06/05/2019   Procedure: ESOPHAGOGASTRODUODENOSCOPY (EGD) WITH PROPOFOL;  Surgeon: Rogene Houston, MD;  [facility];  Service: Endoscopy;  Laterality: N/A;  1220pm  . HARDWARE REMOVAL Right 10/09/2014   Procedure: Removal Deep Hardware, Irrigation and Debridement Calcaneus, Place Antibiotic Beads and Wound VAC ;  Surgeon: Newt Minion, MD;  Location: [place];  Service: Orthopedics;  Laterality: Right;  . HARDWARE REMOVAL Right 08/12/2015   Procedure: Removal Deep Hardware Right Foot;  Surgeon: Newt Minion, MD;  Location: [place];  Service: Orthopedics;  Laterality: Right;  . HARDWARE REMOVAL Right 11/26/2018   Procedure: REMOVAL RIGHT TIBIOCALCANEAL NAIL;  Surgeon: Newt Minion, MD;  Location: [place];  Service: Orthopedics;  Laterality: Right;  . HARDWARE REMOVAL Right 12/23/2018   Procedure: RIGHT ANKLE REMOVE HARDWARE, PLACE ANTIBIOTIC BEADS and placement of wound vac;  Surgeon: Newt Minion, MD;  Location: [place];  Service: Orthopedics;  Laterality: Right;  . HARDWARE REMOVAL Right 09/29/2019   Procedure: RIGHT ANKLE REMOVE HARDWARE, DEBRIDEMENT;  Surgeon: Newt Minion, MD;  Location: [place];  Service: Orthopedics;  Laterality: Right;  . I & D EXTREMITY Right 09/15/2014   Procedure: IRRIGATION AND DEBRIDEMENT Ankle;  Surgeon: Renette Butters, MD;  Location: [place];  Service: Orthopedics;  Laterality: Right;  . INGUINAL HERNIA REPAIR Bilateral   .  LAMINECTOMY WITH POSTERIOR LATERAL ARTHRODESIS LEVEL 4 N/A 09/10/2018   Procedure: Posterior Lateral Fusion - Thoracic Eleven-Thoracic Twelve - Thoracic Twelve-Lumbar One - Lumbar One-Lumbar Two - Lumbar Two-Lumbar Three with instrumentaion and PLA;  Surgeon: Kary Kos, MD;  Location: [place];  Service: Neurosurgery;  Laterality: N/A;  Posterior Lateral Fusion - Thoracic Eleven-Thoracic Twelve - Thoracic Twelve-Lumbar One - Lumbar One-Lumbar Two - Lumbar Two-Lumbar Thre  . LIVER BIOPSY N/A 07/06/2019   Procedure: LIVER BIOPSY;  Surgeon: Virl Cagey, MD;  [facility];  Service: General;  Laterality: N/A;  . LUMBAR PERCUTANEOUS PEDICLE SCREW 1 LEVEL N/A 04/05/2017   Procedure: LUMBAR PERCUTANEOUS PEDICLE SCREW LUMBAR THREE-FOUR;  Surgeon: Kary Kos, MD;  Location: [place];  Service: Neurosurgery;  Laterality: N/A;  . ORIF CALCANEOUS FRACTURE Right 09/19/2014   Procedure: OPEN REDUCTION INTERNAL FIXATION (ORIF) CALCANEOUS FRACTURE;  Surgeon: Newt Minion, MD;  Location: [place];  Service: Orthopedics;  Laterality: Right;  . ORIF CALCANEOUS FRACTURE Left 09/19/2014   Procedure: OPEN REDUCTION INTERNAL FIXATION (ORIF) CALCANEOUS FRACTURE;  Surgeon: Newt Minion, MD;  Location: [place];  Service: Orthopedics;  Laterality: Left;  . POLYPECTOMY  06/05/2019   Procedure: POLYPECTOMY;  Surgeon: Rogene Houston, MD;  [facility];  Service: Endoscopy;;  cecal polyp biopsy forcep, ascending polyp cold snare   Social History   Occupational History    Comment: disabled  Tobacco Use  . Smoking status: Former Smoker    Packs/day: 1.00    Years: 10.00    Pack years: 10.00    Types: Cigars    Start date: 10/08/1976    Quit date: 09/2017    Years since quitting: 2.2  . Smokeless tobacco: Never Used  . Tobacco comment: quit smoking cegar 09/10/2018  Substance and Sexual Activity  . Alcohol use: No    Comment:  quit 1998  . Drug use: No  . Sexual activity: Yes    Birth control/protection:  None

## 2019-12-11 ENCOUNTER — Ambulatory Visit (INDEPENDENT_AMBULATORY_CARE_PROVIDER_SITE_OTHER): Payer: PPO | Admitting: Family

## 2019-12-11 ENCOUNTER — Other Ambulatory Visit: Payer: Self-pay

## 2019-12-11 ENCOUNTER — Encounter: Payer: Self-pay | Admitting: Family

## 2019-12-11 DIAGNOSIS — Z981 Arthrodesis status: Secondary | ICD-10-CM | POA: Diagnosis not present

## 2019-12-11 NOTE — Progress Notes (Signed)
Cast removed and reapplied without incident. Patient considering his longterm options. To return as scheduled with dr Sharol Given

## 2019-12-18 ENCOUNTER — Other Ambulatory Visit (INDEPENDENT_AMBULATORY_CARE_PROVIDER_SITE_OTHER): Payer: Self-pay | Admitting: Internal Medicine

## 2019-12-18 DIAGNOSIS — R140 Abdominal distension (gaseous): Secondary | ICD-10-CM

## 2019-12-21 NOTE — Telephone Encounter (Signed)
Patient needs a OV prior to any more refills. Or the patient may get from his PCP.

## 2019-12-24 DIAGNOSIS — M47816 Spondylosis without myelopathy or radiculopathy, lumbar region: Secondary | ICD-10-CM | POA: Diagnosis not present

## 2019-12-31 DIAGNOSIS — M47816 Spondylosis without myelopathy or radiculopathy, lumbar region: Secondary | ICD-10-CM | POA: Diagnosis not present

## 2020-01-04 ENCOUNTER — Telehealth (INDEPENDENT_AMBULATORY_CARE_PROVIDER_SITE_OTHER): Payer: Self-pay | Admitting: Internal Medicine

## 2020-01-04 NOTE — Telephone Encounter (Signed)
Patient left message stating he is still having nausea - has lost about 40 lbs - also stated he has had sores on his tongue - he was taking something for this but has ran out - please advise - [phone]

## 2020-01-05 ENCOUNTER — Ambulatory Visit: Payer: PPO | Admitting: Orthopedic Surgery

## 2020-01-07 ENCOUNTER — Other Ambulatory Visit (INDEPENDENT_AMBULATORY_CARE_PROVIDER_SITE_OTHER): Payer: Self-pay | Admitting: *Deleted

## 2020-01-07 DIAGNOSIS — B370 Candidal stomatitis: Secondary | ICD-10-CM

## 2020-01-07 MED ORDER — NYSTATIN 100000 UNIT/ML MT SUSP: 5.0000 mL | Freq: Four times a day (QID) | OROMUCOSAL | 1 refills | Status: DC

## 2020-01-07 NOTE — Telephone Encounter (Signed)
Dr.Rehman says the patient needs to have OV as soon as possible. Permission given to call in refill on the Mycostatin. Patient made aware.

## 2020-01-08 DIAGNOSIS — S92101K Unspecified fracture of right talus, subsequent encounter for fracture with nonunion: Secondary | ICD-10-CM | POA: Diagnosis not present

## 2020-01-08 DIAGNOSIS — M25571 Pain in right ankle and joints of right foot: Secondary | ICD-10-CM | POA: Diagnosis not present

## 2020-01-12 ENCOUNTER — Ambulatory Visit: Payer: PPO | Admitting: Orthopedic Surgery

## 2020-01-18 ENCOUNTER — Telehealth: Payer: Self-pay | Admitting: Orthopedic Surgery

## 2020-01-18 ENCOUNTER — Telehealth (INDEPENDENT_AMBULATORY_CARE_PROVIDER_SITE_OTHER): Payer: Self-pay | Admitting: Internal Medicine

## 2020-01-18 NOTE — Telephone Encounter (Signed)
Called and lm on vm to advise that per Dr. Sharol Given to come in the office on Thursday to discuss setting up surgery.

## 2020-01-18 NOTE — Telephone Encounter (Signed)
Per Dr.Rehman - may call in the Zofran 4 mg - the patient may take 1 by mouth twice a day as needed for nausea. A Rx was called to [facility].  Patient was called and made aware. He has appointment to see [facility] June 2021.

## 2020-01-18 NOTE — Telephone Encounter (Signed)
Patient called wanting to know if needs to be seen again before surgery or can he be set up for surgery without an appointment.  Patient also stated that he went for a 2nd opinion and they took the cast off to do x-rays.  Patient's foot is hurting without the cast.  CB#[phone].  Thank you.

## 2020-01-18 NOTE — Telephone Encounter (Signed)
Patient left message on voice mail stating he needs a refill on the medication for his mouth and he never received anything for the nausea - next available appointment is 6/21  - please advise - [phone]

## 2020-01-21 ENCOUNTER — Ambulatory Visit (INDEPENDENT_AMBULATORY_CARE_PROVIDER_SITE_OTHER): Payer: PPO | Admitting: Orthopedic Surgery

## 2020-01-21 ENCOUNTER — Other Ambulatory Visit: Payer: Self-pay

## 2020-01-21 ENCOUNTER — Encounter: Payer: Self-pay | Admitting: Orthopedic Surgery

## 2020-01-21 DIAGNOSIS — M86261 Subacute osteomyelitis, right tibia and fibula: Secondary | ICD-10-CM | POA: Diagnosis not present

## 2020-01-21 NOTE — Progress Notes (Signed)
Office Visit Note   Patient: Bradley Espinoza           Date of Birth: April 18, 1956           MRN: [id number] Visit Date: 01/21/2020              Requested by: Celene Squibb, MD [place],  [place] [id number] PCP: Celene Squibb, MD  Chief Complaint  Patient presents with  . Right Leg - Follow-up      HPI: Is a 64 year old gentleman who is seen in follow-up for recurrent infection and a fibrous nonunion of the right ankle status post open calcaneus fracture.  Patient has undergone multiple surgical interventions for limb salvage and still has a persistent fibrous nonunion of the ankle joint.  Patient states he wants to proceed with a below the knee amputation and he did see Dr. Doran Durand.  Patient states that Dr. Doran Durand agreed with not proceeding with any additional foot salvage intervention and agreed that a transtibial amputation would be his best option.  Assessment & Plan: Visit Diagnoses:  1. Subacute osteomyelitis of right tibia [facility])     Plan: Patient wants to proceed with a transtibial amputation right leg next Friday.  Discussed that the leg length inequality of approximately 2 which is can be corrected with the prosthesis.  Patient has been working with [employer] for his shoes and braces and will follow up with [employer] for prosthetic fitting.  Follow-Up Instructions: Return in about 1 week (around 01/28/2020).   Ortho Exam  Patient is alert, oriented, no adenopathy, well-dressed, normal affect, normal respiratory effort. Examination patient has no hypersensitivity to light touch no dystrophic changes there is no redness no cellulitis no signs of infection he still has a fibrous nonunion of the ankle fusion.  Patient states he can only stand about 3 hours a day.  He states he cannot sleep due to pain at night.  Imaging: No results found. No images are attached to the encounter.  Labs: Lab Results  Component Value Date   HGBA1C 5.1 06/26/2016   ESRSEDRATE 9  12/08/2018   REPTSTATUS 10/04/2019 FINAL 09/29/2019   GRAMSTAIN  09/29/2019    ABUNDANT WBC PRESENT, PREDOMINANTLY PMN FEW GRAM POSITIVE COCCI    CULT  09/29/2019    FEW STAPHYLOCOCCUS AUREUS NO ANAEROBES ISOLATED Performed at [facility], [place] [address], [place], [place] [address]    LABORGA STAPHYLOCOCCUS AUREUS 09/29/2019     Lab Results  Component Value Date   ALBUMIN 4.4 07/02/2019   ALBUMIN 4.3 08/12/2015   ALBUMIN 4.1 05/06/2015    No results found for: MG No results found for: VD25OH  No results found for: PREALBUMIN CBC EXTENDED Latest Ref Rng & Units 09/29/2019 09/09/2019 07/02/2019  WBC 4.0 - 10.5 K/uL 6.8 6.0 8.0  RBC 4.22 - 5.81 MIL/uL 4.67 5.72 5.22  HGB 13.0 - 17.0 g/dL 13.1 16.4 14.8  HCT 39.0 - 52.0 % 42.0 51.1 47.8  PLT 150 - 400 K/uL 305 232 244  NEUTROABS 1.7 - 7.7 K/uL - - 5.7  LYMPHSABS 0.7 - 4.0 K/uL - - 1.6     There is no height or weight on file to calculate BMI.  Orders:  No orders of the defined types were placed in this encounter.  No orders of the defined types were placed in this encounter.    Procedures: No procedures performed  Clinical Data: No additional findings.  ROS:  All other systems  negative, except as noted in the HPI. Review of Systems  Objective: Vital Signs: There were no vitals taken for this visit.  Specialty Comments:  No specialty comments available.  PMFS History: Patient Active Problem List   Diagnosis Date Noted  . Cutaneous abscess of right ankle   . Subacute osteomyelitis of right tibia ([place])   . Fatty liver 06/30/2019  . Calculus of gallbladder without cholecystitis without obstruction 06/30/2019  . Nausea without vomiting 05/13/2019  . Abdominal bloating 05/13/2019  . Wound infection following procedure 12/19/2018  . Malunion of joint fusion ([place]) 11/26/2018  . 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[facility])   . Status post lumbar spinal fusion 09/10/2018  . S/P ankle fusion 11/27/2017  . Nonunion of  subtalar arthrodesis   . Avascular necrosis of talus, right ([place])   . Post-traumatic osteoarthritis, left ankle and foot 10/22/2017  . DDD (degenerative disc disease), lumbar 04/05/2017  . Fracture of L2 vertebra ([place]) 01/12/2015  . Acute osteomyelitis of calcaneum ([place]) 10/09/2014  . Dysuria 10/05/2014  . Cellulitis 10/05/2014  . Fall from ladder 09/21/2014  . L2 vertebral fracture ([place]) 09/21/2014  . Bilateral calcaneal fractures 09/21/2014  . Chronic pain 09/21/2014  . Acute blood loss anemia 09/21/2014  . Open fracture of both calcanei 09/15/2014   Past Medical History:  Diagnosis Date  . Anxiety   . Arthritis    low back pain, lumbar radiculopathy  . Depression   . Fracture    B/L ankles  . GERD (gastroesophageal reflux disease)   . Headache(784.0)    allergy related   . History of kidney stones   . History of stomach ulcers   . Retained orthopedic hardware    failed retained hardware right foot  . Wears glasses   . Wears glasses   . Wears partial dentures     Family History  Problem Relation Age of Onset  . Diabetes Father   . Cancer Other     Past Surgical History:  Procedure Laterality Date  . ANKLE FUSION Right 05/11/2015   Procedure: Right Posterior Arthroscopic Subtalar Arthrodesis;  Surgeon: Newt Minion, MD;  Location: [place];  Service: Orthopedics;  Laterality: Right;  . ANKLE FUSION Right 11/27/2017   Procedure: RIGHT TIBIOCALCANEAL FUSION;  Surgeon: Newt Minion, MD;  Location: [place];  Service: Orthopedics;  Laterality: Right;  . ANKLE FUSION Right 11/26/2018   Procedure: RIGHT ANTERIOR ANKLE FUSION, APPLY VAC;  Surgeon: Newt Minion, MD;  Location: [place];  Service: Orthopedics;  Laterality: Right;  . ANKLE FUSION Right 09/09/2019   Procedure: REVISION RIGHT ANKLE FUSION;  Surgeon: Newt Minion, MD;  Location: [place];  Service: Orthopedics;  Laterality: Right;  . ANTERIOR LAT LUMBAR FUSION N/A 04/05/2017   Procedure: Extreme Lateral Interbody Fusion -  Lumbar three-lumbar four ,exploration of fusion Posterior augmentation with globus addition Removal hardware Lumbar one-three. Lumbar four-sacral one,  Removal internal bone growth stimulator;  Surgeon: Kary Kos, MD;  Location: [place];  Service: Neurosurgery;  Laterality: N/A;  . APPLICATION OF WOUND VAC  12/23/2018   Procedure: Application Of Wound Vac;  Surgeon: Newt Minion, MD;  Location: [place];  Service: Orthopedics;;  . BACK SURGERY  2004   x 2  . BIOPSY  06/05/2019   Procedure: BIOPSY;  Surgeon: Rogene Houston, MD;  [facility];  Service: Endoscopy;;  gastric biopsy  . CERVICAL SPINE SURGERY  2008  . CHOLECYSTECTOMY N/A 07/06/2019   Procedure: LAPAROSCOPIC CHOLECYSTECTOMY;  Surgeon: Constance Haw,  Lanell Matar, MD;  [facility];  Service: General;  Laterality: N/A;  . COLONOSCOPY WITH PROPOFOL N/A 06/05/2019   Procedure: COLONOSCOPY WITH PROPOFOL;  Surgeon: Rogene Houston, MD;  [facility];  Service: Endoscopy;  Laterality: N/A;  . ESOPHAGOGASTRODUODENOSCOPY    . ESOPHAGOGASTRODUODENOSCOPY (EGD) WITH PROPOFOL N/A 06/05/2019   Procedure: ESOPHAGOGASTRODUODENOSCOPY (EGD) WITH PROPOFOL;  Surgeon: Rogene Houston, MD;  [facility];  Service: Endoscopy;  Laterality: N/A;  1220pm  . HARDWARE REMOVAL Right 10/09/2014   Procedure: Removal Deep Hardware, Irrigation and Debridement Calcaneus, Place Antibiotic Beads and Wound VAC ;  Surgeon: Newt Minion, MD;  Location: [place];  Service: Orthopedics;  Laterality: Right;  . HARDWARE REMOVAL Right 08/12/2015   Procedure: Removal Deep Hardware Right Foot;  Surgeon: Newt Minion, MD;  Location: [place];  Service: Orthopedics;  Laterality: Right;  . HARDWARE REMOVAL Right 11/26/2018   Procedure: REMOVAL RIGHT TIBIOCALCANEAL NAIL;  Surgeon: Newt Minion, MD;  Location: [place];  Service: Orthopedics;  Laterality: Right;  . HARDWARE REMOVAL Right 12/23/2018   Procedure: RIGHT ANKLE REMOVE HARDWARE, PLACE ANTIBIOTIC BEADS and  placement of wound vac;  Surgeon: Newt Minion, MD;  Location: [place];  Service: Orthopedics;  Laterality: Right;  . HARDWARE REMOVAL Right 09/29/2019   Procedure: RIGHT ANKLE REMOVE HARDWARE, DEBRIDEMENT;  Surgeon: Newt Minion, MD;  Location: [place];  Service: Orthopedics;  Laterality: Right;  . I & D EXTREMITY Right 09/15/2014   Procedure: IRRIGATION AND DEBRIDEMENT Ankle;  Surgeon: Renette Butters, MD;  Location: [place];  Service: Orthopedics;  Laterality: Right;  . INGUINAL HERNIA REPAIR Bilateral   . LAMINECTOMY WITH POSTERIOR LATERAL ARTHRODESIS LEVEL 4 N/A 09/10/2018   Procedure: Posterior Lateral Fusion - Thoracic Eleven-Thoracic Twelve - Thoracic Twelve-Lumbar One - Lumbar One-Lumbar Two - Lumbar Two-Lumbar Three with instrumentaion and PLA;  Surgeon: Kary Kos, MD;  Location: [place];  Service: Neurosurgery;  Laterality: N/A;  Posterior Lateral Fusion - Thoracic Eleven-Thoracic Twelve - Thoracic Twelve-Lumbar One - Lumbar One-Lumbar Two - Lumbar Two-Lumbar Thre  . LIVER BIOPSY N/A 07/06/2019   Procedure: LIVER BIOPSY;  Surgeon: Virl Cagey, MD;  [facility];  Service: General;  Laterality: N/A;  . LUMBAR PERCUTANEOUS PEDICLE SCREW 1 LEVEL N/A 04/05/2017   Procedure: LUMBAR PERCUTANEOUS PEDICLE SCREW LUMBAR THREE-FOUR;  Surgeon: Kary Kos, MD;  Location: [place];  Service: Neurosurgery;  Laterality: N/A;  . ORIF CALCANEOUS FRACTURE Right 09/19/2014   Procedure: OPEN REDUCTION INTERNAL FIXATION (ORIF) CALCANEOUS FRACTURE;  Surgeon: Newt Minion, MD;  Location: [place];  Service: Orthopedics;  Laterality: Right;  . ORIF CALCANEOUS FRACTURE Left 09/19/2014   Procedure: OPEN REDUCTION INTERNAL FIXATION (ORIF) CALCANEOUS FRACTURE;  Surgeon: Newt Minion, MD;  Location: [place];  Service: Orthopedics;  Laterality: Left;  . POLYPECTOMY  06/05/2019   Procedure: POLYPECTOMY;  Surgeon: Rogene Houston, MD;  [facility];  Service: Endoscopy;;  cecal polyp biopsy forcep, ascending  polyp cold snare   Social History   Occupational History    Comment: disabled  Tobacco Use  . Smoking status: Former Smoker    Packs/day: 1.00    Years: 10.00    Pack years: 10.00    Types: Cigars    Start date: 10/08/1976    Quit date: 09/2017    Years since quitting: 2.3  . Smokeless tobacco: Never Used  . Tobacco comment: quit smoking cegar 09/10/2018  Substance and Sexual Activity  .  Alcohol use: No    Comment:  quit 1998  . Drug use: No  . Sexual activity: Yes    Birth control/protection: None

## 2020-01-26 ENCOUNTER — Other Ambulatory Visit (HOSPITAL_COMMUNITY)
Admission: RE | Admit: 2020-01-26 | Discharge: 2020-01-26 | Disposition: A | Discharge status: Home / Self Care | Payer: PPO | Source: Ambulatory Visit | Attending: Orthopedic Surgery | Admitting: Orthopedic Surgery

## 2020-01-26 ENCOUNTER — Other Ambulatory Visit: Payer: Self-pay

## 2020-01-26 DIAGNOSIS — Z20822 Contact with and (suspected) exposure to covid-19: Secondary | ICD-10-CM | POA: Diagnosis present

## 2020-01-26 DIAGNOSIS — M86171 Other acute osteomyelitis, right ankle and foot: Secondary | ICD-10-CM | POA: Diagnosis not present

## 2020-01-26 DIAGNOSIS — Z87442 Personal history of urinary calculi: Secondary | ICD-10-CM | POA: Diagnosis not present

## 2020-01-26 DIAGNOSIS — Z9049 Acquired absence of other specified parts of digestive tract: Secondary | ICD-10-CM | POA: Diagnosis not present

## 2020-01-26 DIAGNOSIS — Z8711 Personal history of peptic ulcer disease: Secondary | ICD-10-CM | POA: Diagnosis not present

## 2020-01-26 DIAGNOSIS — Z885 Allergy status to narcotic agent status: Secondary | ICD-10-CM | POA: Diagnosis not present

## 2020-01-26 DIAGNOSIS — X58XXXS Exposure to other specified factors, sequela: Secondary | ICD-10-CM | POA: Diagnosis present

## 2020-01-26 DIAGNOSIS — K760 Fatty (change of) liver, not elsewhere classified: Secondary | ICD-10-CM | POA: Diagnosis present

## 2020-01-26 DIAGNOSIS — Z881 Allergy status to other antibiotic agents status: Secondary | ICD-10-CM | POA: Diagnosis not present

## 2020-01-26 DIAGNOSIS — K8020 Calculus of gallbladder without cholecystitis without obstruction: Secondary | ICD-10-CM | POA: Diagnosis not present

## 2020-01-26 DIAGNOSIS — Z833 Family history of diabetes mellitus: Secondary | ICD-10-CM | POA: Diagnosis not present

## 2020-01-26 DIAGNOSIS — M86671 Other chronic osteomyelitis, right ankle and foot: Secondary | ICD-10-CM | POA: Diagnosis present

## 2020-01-26 DIAGNOSIS — Z87891 Personal history of nicotine dependence: Secondary | ICD-10-CM | POA: Diagnosis not present

## 2020-01-26 DIAGNOSIS — K219 Gastro-esophageal reflux disease without esophagitis: Secondary | ICD-10-CM | POA: Diagnosis present

## 2020-01-26 DIAGNOSIS — G8929 Other chronic pain: Secondary | ICD-10-CM | POA: Diagnosis present

## 2020-01-26 DIAGNOSIS — Z981 Arthrodesis status: Secondary | ICD-10-CM | POA: Diagnosis not present

## 2020-01-26 DIAGNOSIS — M12571 Traumatic arthropathy, right ankle and foot: Secondary | ICD-10-CM | POA: Diagnosis present

## 2020-01-26 DIAGNOSIS — S92001S Unspecified fracture of right calcaneus, sequela: Secondary | ICD-10-CM | POA: Diagnosis not present

## 2020-01-26 DIAGNOSIS — M960 Pseudarthrosis after fusion or arthrodesis: Secondary | ICD-10-CM | POA: Diagnosis not present

## 2020-01-26 DIAGNOSIS — M5136 Other intervertebral disc degeneration, lumbar region: Secondary | ICD-10-CM | POA: Diagnosis not present

## 2020-01-26 DIAGNOSIS — M19171 Post-traumatic osteoarthritis, right ankle and foot: Secondary | ICD-10-CM | POA: Diagnosis not present

## 2020-01-26 DIAGNOSIS — M899 Disorder of bone, unspecified: Secondary | ICD-10-CM | POA: Diagnosis not present

## 2020-01-26 DIAGNOSIS — G8918 Other acute postprocedural pain: Secondary | ICD-10-CM | POA: Diagnosis not present

## 2020-01-27 ENCOUNTER — Encounter (HOSPITAL_COMMUNITY): Payer: Self-pay | Admitting: Orthopedic Surgery

## 2020-01-27 ENCOUNTER — Other Ambulatory Visit: Payer: Self-pay | Admitting: Physician Assistant

## 2020-01-27 ENCOUNTER — Other Ambulatory Visit: Payer: Self-pay

## 2020-01-27 LAB — SARS CORONAVIRUS 2 (TAT 6-24 HRS): SARS Coronavirus 2: NEGATIVE

## 2020-01-27 NOTE — Progress Notes (Addendum)
Patient denies shortness of breath, fever, cough and chest pain.  PCP - Dr Pamella Pert. [address] Dr Hildred Laser  Chest x-ray - n/a EKG - n/a Stress Test - n/a ECHO - n/a Cardiac Cath - n/a  ERAS:  Clears til 0715 DOS, no drink.  STOP now taking any Aspirin (unless otherwise instructed by your surgeon), Aleve, Naproxen, Ibuprofen, Motrin, Advil, Goody's, BC's, all herbal medications, fish oil, and all vitamins.   Coronavirus Screening Covid test on 01/26/20 was negative.  Patient verbalized understanding of instructions that were given via phone.

## 2020-01-29 ENCOUNTER — Encounter (HOSPITAL_COMMUNITY): Payer: Self-pay | Admitting: Orthopedic Surgery

## 2020-01-29 ENCOUNTER — Inpatient Hospital Stay (HOSPITAL_COMMUNITY): Payer: PPO | Admitting: Anesthesiology

## 2020-01-29 ENCOUNTER — Encounter (HOSPITAL_COMMUNITY)
Admission: RE | Disposition: A | Discharge status: Home / Self Care | Payer: Self-pay | Source: Home / Self Care | Attending: Orthopedic Surgery

## 2020-01-29 ENCOUNTER — Inpatient Hospital Stay (HOSPITAL_COMMUNITY)
Admission: RE | Admit: 2020-01-29 | Discharge: 2020-02-01 | DRG: 475 | Disposition: A | Discharge status: Home / Self Care | Payer: PPO | Attending: Orthopedic Surgery | Admitting: Orthopedic Surgery

## 2020-01-29 ENCOUNTER — Other Ambulatory Visit: Payer: Self-pay

## 2020-01-29 DIAGNOSIS — Z885 Allergy status to narcotic agent status: Secondary | ICD-10-CM

## 2020-01-29 DIAGNOSIS — G8929 Other chronic pain: Secondary | ICD-10-CM | POA: Diagnosis present

## 2020-01-29 DIAGNOSIS — M86171 Other acute osteomyelitis, right ankle and foot: Secondary | ICD-10-CM | POA: Diagnosis not present

## 2020-01-29 DIAGNOSIS — S92001S Unspecified fracture of right calcaneus, sequela: Secondary | ICD-10-CM | POA: Diagnosis not present

## 2020-01-29 DIAGNOSIS — Z981 Arthrodesis status: Secondary | ICD-10-CM

## 2020-01-29 DIAGNOSIS — Z87891 Personal history of nicotine dependence: Secondary | ICD-10-CM

## 2020-01-29 DIAGNOSIS — Z87442 Personal history of urinary calculi: Secondary | ICD-10-CM

## 2020-01-29 DIAGNOSIS — Z881 Allergy status to other antibiotic agents status: Secondary | ICD-10-CM | POA: Diagnosis not present

## 2020-01-29 DIAGNOSIS — M12571 Traumatic arthropathy, right ankle and foot: Secondary | ICD-10-CM | POA: Diagnosis present

## 2020-01-29 DIAGNOSIS — M86671 Other chronic osteomyelitis, right ankle and foot: Secondary | ICD-10-CM | POA: Diagnosis present

## 2020-01-29 DIAGNOSIS — Z8711 Personal history of peptic ulcer disease: Secondary | ICD-10-CM

## 2020-01-29 DIAGNOSIS — Z20822 Contact with and (suspected) exposure to covid-19: Secondary | ICD-10-CM | POA: Diagnosis present

## 2020-01-29 DIAGNOSIS — M86071 Acute hematogenous osteomyelitis, right ankle and foot: Secondary | ICD-10-CM | POA: Diagnosis present

## 2020-01-29 DIAGNOSIS — K760 Fatty (change of) liver, not elsewhere classified: Secondary | ICD-10-CM | POA: Diagnosis present

## 2020-01-29 DIAGNOSIS — M19171 Post-traumatic osteoarthritis, right ankle and foot: Secondary | ICD-10-CM | POA: Diagnosis not present

## 2020-01-29 DIAGNOSIS — Z833 Family history of diabetes mellitus: Secondary | ICD-10-CM

## 2020-01-29 DIAGNOSIS — K219 Gastro-esophageal reflux disease without esophagitis: Secondary | ICD-10-CM | POA: Diagnosis present

## 2020-01-29 DIAGNOSIS — Z9049 Acquired absence of other specified parts of digestive tract: Secondary | ICD-10-CM | POA: Diagnosis not present

## 2020-01-29 DIAGNOSIS — X58XXXS Exposure to other specified factors, sequela: Secondary | ICD-10-CM | POA: Diagnosis present

## 2020-01-29 HISTORY — PX: AMPUTATION: SHX166

## 2020-01-29 HISTORY — DX: Fatty (change of) liver, not elsewhere classified: K76.0

## 2020-01-29 LAB — CBC
HCT: 48.1 % (ref 39.0–52.0)
Hemoglobin: 15.2 g/dL (ref 13.0–17.0)
MCH: 28.5 pg (ref 26.0–34.0)
MCHC: 31.6 g/dL (ref 30.0–36.0)
MCV: 90.1 fL (ref 80.0–100.0)
Platelets: 221 10*3/uL (ref 150–400)
RBC: 5.34 MIL/uL (ref 4.22–5.81)
RDW: 15.3 % (ref 11.5–15.5)
WBC: 6.7 10*3/uL (ref 4.0–10.5)
nRBC: 0 % (ref 0.0–0.2)

## 2020-01-29 SURGERY — AMPUTATION BELOW KNEE
Anesthesia: General | Site: Knee | Laterality: Right

## 2020-01-29 MED ORDER — PANTOPRAZOLE SODIUM 40 MG PO TBEC
40.0000 mg | DELAYED_RELEASE_TABLET | Freq: Every day | ORAL | Status: DC
  Administered 2020-01-30 – 2020-02-01 (×3): 40 mg via ORAL
  Filled 2020-01-29 (×3): qty 1

## 2020-01-29 MED ORDER — MIDAZOLAM HCL 2 MG/2ML IJ SOLN
INTRAMUSCULAR | Status: AC
  Filled 2020-01-29: qty 2

## 2020-01-29 MED ORDER — MEPERIDINE HCL 25 MG/ML IJ SOLN
INTRAMUSCULAR | Status: AC
  Filled 2020-01-29: qty 1

## 2020-01-29 MED ORDER — HYDROMORPHONE HCL 1 MG/ML IJ SOLN
INTRAMUSCULAR | Status: AC
  Filled 2020-01-29: qty 1

## 2020-01-29 MED ORDER — TAMSULOSIN HCL 0.4 MG PO CAPS
0.8000 mg | ORAL_CAPSULE | Freq: Every day | ORAL | Status: DC
  Administered 2020-01-30 – 2020-02-01 (×3): 0.8 mg via ORAL
  Filled 2020-01-29 (×4): qty 2

## 2020-01-29 MED ORDER — FENTANYL CITRATE (PF) 100 MCG/2ML IJ SOLN
INTRAMUSCULAR | Status: AC
  Administered 2020-01-29: 100 ug via INTRAVENOUS
  Filled 2020-01-29: qty 2

## 2020-01-29 MED ORDER — ONDANSETRON HCL 4 MG/2ML IJ SOLN: 4.0000 mg | Freq: Once | INTRAMUSCULAR | Status: DC | PRN

## 2020-01-29 MED ORDER — HYDROMORPHONE HCL 1 MG/ML IJ SOLN
0.5000 mg | INTRAMUSCULAR | Status: DC | PRN
  Administered 2020-01-29 – 2020-02-01 (×10): 1 mg via INTRAVENOUS
  Filled 2020-01-29 (×11): qty 1

## 2020-01-29 MED ORDER — HYDROMORPHONE HCL 1 MG/ML IJ SOLN
0.5000 mg | INTRAMUSCULAR | Status: DC | PRN
  Administered 2020-01-29: 0.5 mg via INTRAVENOUS

## 2020-01-29 MED ORDER — MEPERIDINE HCL 25 MG/ML IJ SOLN
6.2500 mg | INTRAMUSCULAR | Status: DC | PRN
  Administered 2020-01-29: 6.25 mg via INTRAVENOUS

## 2020-01-29 MED ORDER — CEFAZOLIN SODIUM-DEXTROSE 2-4 GM/100ML-% IV SOLN
2.0000 g | INTRAVENOUS | Status: AC
  Administered 2020-01-29: 11:00:00 2 g via INTRAVENOUS
  Filled 2020-01-29: qty 100

## 2020-01-29 MED ORDER — ASPIRIN EC 325 MG PO TBEC
325.0000 mg | DELAYED_RELEASE_TABLET | Freq: Every day | ORAL | Status: DC
  Administered 2020-01-30 – 2020-02-01 (×3): 325 mg via ORAL
  Filled 2020-01-29 (×3): qty 1

## 2020-01-29 MED ORDER — EPHEDRINE 5 MG/ML INJ
INTRAVENOUS | Status: AC
  Filled 2020-01-29: qty 10

## 2020-01-29 MED ORDER — SODIUM CHLORIDE 0.9 % IR SOLN
Status: DC | PRN
  Administered 2020-01-29: 1000 mL

## 2020-01-29 MED ORDER — DOCUSATE SODIUM 100 MG PO CAPS
100.0000 mg | ORAL_CAPSULE | Freq: Two times a day (BID) | ORAL | Status: DC
  Administered 2020-01-30 – 2020-01-31 (×4): 100 mg via ORAL
  Filled 2020-01-29 (×5): qty 1

## 2020-01-29 MED ORDER — DEXAMETHASONE SODIUM PHOSPHATE 10 MG/ML IJ SOLN
INTRAMUSCULAR | Status: DC | PRN
  Administered 2020-01-29: 4 mg via INTRAVENOUS

## 2020-01-29 MED ORDER — DEXAMETHASONE SODIUM PHOSPHATE 10 MG/ML IJ SOLN
INTRAMUSCULAR | Status: AC
  Filled 2020-01-29: qty 1

## 2020-01-29 MED ORDER — HYDROMORPHONE HCL 1 MG/ML IJ SOLN
0.2500 mg | INTRAMUSCULAR | Status: DC | PRN
  Administered 2020-01-29: 0.5 mg via INTRAVENOUS
  Administered 2020-01-29: 1 mg via INTRAVENOUS
  Administered 2020-01-29: 13:00:00 0.5 mg via INTRAVENOUS

## 2020-01-29 MED ORDER — LIDOCAINE HCL (CARDIAC) PF 100 MG/5ML IV SOSY
PREFILLED_SYRINGE | INTRAVENOUS | Status: DC | PRN
  Administered 2020-01-29: 80 mg via INTRATRACHEAL

## 2020-01-29 MED ORDER — EPHEDRINE SULFATE-NACL 50-0.9 MG/10ML-% IV SOSY
PREFILLED_SYRINGE | INTRAVENOUS | Status: DC | PRN
  Administered 2020-01-29 (×2): 10 mg via INTRAVENOUS

## 2020-01-29 MED ORDER — OXYCODONE HCL 5 MG PO TABS
5.0000 mg | ORAL_TABLET | ORAL | Status: DC | PRN
  Administered 2020-01-29 (×2): 10 mg via ORAL
  Administered 2020-01-30: 5 mg via ORAL
  Administered 2020-01-30: 10 mg via ORAL
  Filled 2020-01-29 (×4): qty 2

## 2020-01-29 MED ORDER — PROPOFOL 10 MG/ML IV BOLUS
INTRAVENOUS | Status: AC
  Filled 2020-01-29: qty 20

## 2020-01-29 MED ORDER — KETOROLAC TROMETHAMINE 15 MG/ML IJ SOLN
15.0000 mg | Freq: Four times a day (QID) | INTRAMUSCULAR | Status: AC
  Administered 2020-01-29 – 2020-01-30 (×4): 15 mg via INTRAVENOUS
  Filled 2020-01-29 (×4): qty 1

## 2020-01-29 MED ORDER — OXYCODONE HCL 5 MG/5ML PO SOLN: 5.0000 mg | Freq: Once | ORAL | Status: AC | PRN

## 2020-01-29 MED ORDER — METHOCARBAMOL 1000 MG/10ML IJ SOLN
500.0000 mg | Freq: Four times a day (QID) | INTRAVENOUS | Status: DC | PRN
  Filled 2020-01-29: qty 5

## 2020-01-29 MED ORDER — ALBUTEROL SULFATE (2.5 MG/3ML) 0.083% IN NEBU: 2.5000 mg | INHALATION_SOLUTION | RESPIRATORY_TRACT | Status: DC | PRN

## 2020-01-29 MED ORDER — HYDROMORPHONE HCL 1 MG/ML IJ SOLN
1.0000 mg | Freq: Once | INTRAMUSCULAR | Status: AC
  Administered 2020-01-29: 1 mg via INTRAVENOUS

## 2020-01-29 MED ORDER — METOCLOPRAMIDE HCL 5 MG/ML IJ SOLN: 5.0000 mg | Freq: Three times a day (TID) | INTRAMUSCULAR | Status: DC | PRN

## 2020-01-29 MED ORDER — FENTANYL CITRATE (PF) 100 MCG/2ML IJ SOLN: 100.0000 ug | Freq: Once | INTRAMUSCULAR | Status: AC

## 2020-01-29 MED ORDER — METOCLOPRAMIDE HCL 5 MG PO TABS: 5.0000 mg | ORAL_TABLET | Freq: Three times a day (TID) | ORAL | Status: DC | PRN

## 2020-01-29 MED ORDER — FENTANYL CITRATE (PF) 250 MCG/5ML IJ SOLN
INTRAMUSCULAR | Status: AC
  Filled 2020-01-29: qty 5

## 2020-01-29 MED ORDER — BUPIVACAINE-EPINEPHRINE (PF) 0.5% -1:200000 IJ SOLN
INTRAMUSCULAR | Status: DC | PRN
Start: 2020-01-29 — End: 2020-01-29
  Administered 2020-01-29: 30 mL via PERINEURAL

## 2020-01-29 MED ORDER — ACETAMINOPHEN 325 MG PO TABS: 325.0000 mg | ORAL_TABLET | Freq: Four times a day (QID) | ORAL | Status: DC | PRN

## 2020-01-29 MED ORDER — ALBUTEROL SULFATE HFA 108 (90 BASE) MCG/ACT IN AERS: 2.0000 | INHALATION_SPRAY | RESPIRATORY_TRACT | Status: DC | PRN

## 2020-01-29 MED ORDER — SODIUM CHLORIDE 0.9 % IV SOLN: INTRAVENOUS | Status: DC

## 2020-01-29 MED ORDER — LACTATED RINGERS IV SOLN: INTRAVENOUS | Status: DC

## 2020-01-29 MED ORDER — ONDANSETRON HCL 4 MG/2ML IJ SOLN
INTRAMUSCULAR | Status: AC
  Filled 2020-01-29: qty 2

## 2020-01-29 MED ORDER — ONDANSETRON HCL 4 MG PO TABS: 4.0000 mg | ORAL_TABLET | Freq: Four times a day (QID) | ORAL | Status: DC | PRN

## 2020-01-29 MED ORDER — GABAPENTIN 300 MG PO CAPS
300.0000 mg | ORAL_CAPSULE | Freq: Three times a day (TID) | ORAL | Status: DC
  Administered 2020-01-29 – 2020-02-01 (×9): 300 mg via ORAL
  Filled 2020-01-29 (×9): qty 1

## 2020-01-29 MED ORDER — OXYCODONE HCL 5 MG PO TABS
ORAL_TABLET | ORAL | Status: AC
  Filled 2020-01-29: qty 1

## 2020-01-29 MED ORDER — OXYCODONE HCL 5 MG PO TABS
5.0000 mg | ORAL_TABLET | Freq: Once | ORAL | Status: AC | PRN
  Administered 2020-01-29: 5 mg via ORAL

## 2020-01-29 MED ORDER — MIDAZOLAM HCL 2 MG/2ML IJ SOLN
INTRAMUSCULAR | Status: AC
  Administered 2020-01-29: 2 mg via INTRAVENOUS
  Filled 2020-01-29: qty 2

## 2020-01-29 MED ORDER — ONDANSETRON HCL 4 MG/2ML IJ SOLN
INTRAMUSCULAR | Status: DC | PRN
  Administered 2020-01-29: 4 mg via INTRAVENOUS

## 2020-01-29 MED ORDER — MIDAZOLAM HCL 2 MG/2ML IJ SOLN: 2.0000 mg | Freq: Once | INTRAMUSCULAR | Status: AC

## 2020-01-29 MED ORDER — PROPOFOL 10 MG/ML IV BOLUS
INTRAVENOUS | Status: DC | PRN
  Administered 2020-01-29: 200 mg via INTRAVENOUS

## 2020-01-29 MED ORDER — ONDANSETRON HCL 4 MG/2ML IJ SOLN: 4.0000 mg | Freq: Four times a day (QID) | INTRAMUSCULAR | Status: DC | PRN

## 2020-01-29 MED ORDER — HYDROMORPHONE HCL 1 MG/ML IJ SOLN
0.5000 mg | INTRAMUSCULAR | Status: DC | PRN
  Filled 2020-01-29: qty 1

## 2020-01-29 MED ORDER — MIDAZOLAM HCL 2 MG/2ML IJ SOLN
INTRAMUSCULAR | Status: DC | PRN
  Administered 2020-01-29: 2 mg via INTRAVENOUS

## 2020-01-29 MED ORDER — CEFAZOLIN SODIUM-DEXTROSE 1-4 GM/50ML-% IV SOLN
1.0000 g | Freq: Four times a day (QID) | INTRAVENOUS | Status: AC
  Administered 2020-01-29 – 2020-01-30 (×3): 1 g via INTRAVENOUS
  Filled 2020-01-29 (×3): qty 50

## 2020-01-29 MED ORDER — METHOCARBAMOL 500 MG PO TABS
500.0000 mg | ORAL_TABLET | Freq: Four times a day (QID) | ORAL | Status: DC | PRN
  Administered 2020-01-29 – 2020-01-31 (×3): 500 mg via ORAL
  Filled 2020-01-29 (×3): qty 1

## 2020-01-29 MED ORDER — FENTANYL CITRATE (PF) 250 MCG/5ML IJ SOLN
INTRAMUSCULAR | Status: DC | PRN
  Administered 2020-01-29 (×2): 25 ug via INTRAVENOUS
  Administered 2020-01-29: 100 ug via INTRAVENOUS

## 2020-01-29 SURGICAL SUPPLY — 35 items
BLADE SAW RECIP 87.9 MT (BLADE) ×3 IMPLANT
BLADE SURG 21 STRL SS (BLADE) ×3 IMPLANT
BNDG COHESIVE 6X5 TAN STRL LF (GAUZE/BANDAGES/DRESSINGS) IMPLANT
CANISTER PREVENA 45 (CANNISTER) ×2 IMPLANT
CANISTER WOUND CARE 500ML ATS (WOUND CARE) ×3 IMPLANT
COVER SURGICAL LIGHT HANDLE (MISCELLANEOUS) ×3 IMPLANT
CUFF TOURN SGL QUICK 34 (TOURNIQUET CUFF) ×3
CUFF TRNQT CYL 34X4.125X (TOURNIQUET CUFF) ×1 IMPLANT
DRAPE INCISE IOBAN 66X45 STRL (DRAPES) ×3 IMPLANT
DRAPE U-SHAPE 47X51 STRL (DRAPES) ×3 IMPLANT
DRESSING PREVENA PLUS CUSTOM (GAUZE/BANDAGES/DRESSINGS) ×1 IMPLANT
DRSG PREVENA PLUS CUSTOM (GAUZE/BANDAGES/DRESSINGS) ×3
DURAPREP 26ML APPLICATOR (WOUND CARE) ×3 IMPLANT
ELECT REM PT RETURN 9FT ADLT (ELECTROSURGICAL) ×3
ELECTRODE REM PT RTRN 9FT ADLT (ELECTROSURGICAL) ×1 IMPLANT
GLOVE BIOGEL PI IND STRL 9 (GLOVE) ×1 IMPLANT
GLOVE BIOGEL PI INDICATOR 9 (GLOVE) ×2
GLOVE SURG ORTHO 9.0 STRL STRW (GLOVE) ×3 IMPLANT
GOWN STRL REUS W/ TWL XL LVL3 (GOWN DISPOSABLE) ×2 IMPLANT
GOWN STRL REUS W/TWL XL LVL3 (GOWN DISPOSABLE) ×6
KIT BASIN OR (CUSTOM PROCEDURE TRAY) ×3 IMPLANT
KIT TURNOVER KIT B (KITS) ×3 IMPLANT
MANIFOLD NEPTUNE II (INSTRUMENTS) ×3 IMPLANT
NS IRRIG 1000ML POUR BTL (IV SOLUTION) ×3 IMPLANT
PACK ORTHO EXTREMITY (CUSTOM PROCEDURE TRAY) ×3 IMPLANT
PREVENA INCISION MGT 90 150 (MISCELLANEOUS) ×2 IMPLANT
PREVENA RESTOR ARTHOFORM 46X30 (CANNISTER) ×3 IMPLANT
STOCKINETTE IMPERVIOUS LG (DRAPES) ×3 IMPLANT
SUT ETHILON 2 0 PSLX (SUTURE) ×4 IMPLANT
SUT SILK 2 0 (SUTURE) ×3
SUT SILK 2-0 18XBRD TIE 12 (SUTURE) ×1 IMPLANT
SUT VIC AB 1 CTX 27 (SUTURE) ×6 IMPLANT
TOWEL GREEN STERILE (TOWEL DISPOSABLE) ×3 IMPLANT
TUBE CONNECTING 12'X1/4 (SUCTIONS) ×1
TUBE CONNECTING 12X1/4 (SUCTIONS) ×2 IMPLANT

## 2020-01-29 NOTE — Anesthesia Procedure Notes (Signed)
Procedure Name: LMA Insertion Date/Time: 01/29/2020 11:25 AM Performed by: Kathryne Hitch, CRNA Pre-anesthesia Checklist: Patient identified, Emergency Drugs available, Suction available and Patient being monitored Patient Re-evaluated:Patient Re-evaluated prior to induction Oxygen Delivery Method: Circle system utilized Preoxygenation: Pre-oxygenation with 100% oxygen Induction Type: IV induction LMA: LMA inserted LMA Size: 4.0 Number of attempts: 1 Placement Confirmation: positive ETCO2 and breath sounds checked- equal and bilateral Tube secured with: Tape Dental Injury: Teeth and Oropharynx as per pre-operative assessment

## 2020-01-29 NOTE — Anesthesia Preprocedure Evaluation (Signed)
Anesthesia Evaluation  Patient identified by MRN, date of birth, ID band Patient awake    Reviewed: Allergy & Precautions, NPO status , Patient's Chart, lab work & pertinent test results  Airway Mallampati: II  TM Distance: >3 FB Neck ROM: Full    Dental  (+) Partial Upper   Pulmonary former smoker,    Pulmonary exam normal breath sounds clear to auscultation       Cardiovascular negative cardio ROS Normal cardiovascular exam Rhythm:Regular Rate:Normal     Neuro/Psych  Headaches, PSYCHIATRIC DISORDERS Anxiety Depression    GI/Hepatic Neg liver ROS, GERD  Medicated and Controlled,  Endo/Other  negative endocrine ROS  Renal/GU Hx/o renal calculi   BPH    Musculoskeletal  (+) Arthritis , Osteoarthritis,  Failed fusion right ankle    Abdominal   Peds  Hematology  (+) anemia ,   Anesthesia Other Findings   Reproductive/Obstetrics                             Anesthesia Physical Anesthesia Plan  ASA: III  Anesthesia Plan: General   Post-op Pain Management:  Regional for Post-op pain   Induction: Intravenous  PONV Risk Score and Plan: 3 and Ondansetron, Treatment may vary due to age or medical condition and Midazolam  Airway Management Planned: LMA  Additional Equipment:   Intra-op Plan:   Post-operative Plan: Extubation in OR  Informed Consent: I have reviewed the patients History and Physical, chart, labs and discussed the procedure including the risks, benefits and alternatives for the proposed anesthesia with the patient or authorized representative who has indicated his/her understanding and acceptance.     Dental advisory given  Plan Discussed with: CRNA and Surgeon  Anesthesia Plan Comments:         Anesthesia Quick Evaluation

## 2020-01-29 NOTE — H&P (Signed)
Bradley Espinoza is an 64 y.o. male.   Chief Complaint: Right Ankle Pain HPI: HPI: Is a 64 year old gentleman who is seen in follow-up for recurrent infection and a fibrous nonunion of the right ankle status post open calcaneus fracture.  Patient has undergone multiple surgical interventions for limb salvage and still has a persistent fibrous nonunion of the ankle joint.  Patient states he wants to proceed with a below the knee amputation and he did see Dr. Doran Durand.  Patient states that Dr. Doran Durand agreed with not proceeding with any additional foot salvage intervention and agreed that a transtibial amputation would be his best option.   Past Medical History:  Diagnosis Date  . Anxiety   . Arthritis    low back pain, lumbar radiculopathy  . Depression   . Fatty liver   . Fracture    B/L ankles  . GERD (gastroesophageal reflux disease)   . Headache(784.0)    allergy related   . History of kidney stones    passed stones  . History of stomach ulcers   . Retained orthopedic hardware    failed retained hardware right foot  . Wears glasses   . Wears glasses   . Wears partial dentures    upper    Past Surgical History:  Procedure Laterality Date  . ANKLE FUSION Right 05/11/2015   Procedure: Right Posterior Arthroscopic Subtalar Arthrodesis;  Surgeon: Newt Minion, MD;  Location: [place];  Service: Orthopedics;  Laterality: Right;  . ANKLE FUSION Right 11/27/2017   Procedure: RIGHT TIBIOCALCANEAL FUSION;  Surgeon: Newt Minion, MD;  Location: [place];  Service: Orthopedics;  Laterality: Right;  . ANKLE FUSION Right 11/26/2018   Procedure: RIGHT ANTERIOR ANKLE FUSION, APPLY VAC;  Surgeon: Newt Minion, MD;  Location: [place];  Service: Orthopedics;  Laterality: Right;  . ANKLE FUSION Right 09/09/2019   Procedure: REVISION RIGHT ANKLE FUSION;  Surgeon: Newt Minion, MD;  Location: [place];  Service: Orthopedics;  Laterality: Right;  . ANTERIOR LAT LUMBAR FUSION N/A 04/05/2017   Procedure:  Extreme Lateral Interbody Fusion - Lumbar three-lumbar four ,exploration of fusion Posterior augmentation with globus addition Removal hardware Lumbar one-three. Lumbar four-sacral one,  Removal internal bone growth stimulator;  Surgeon: Kary Kos, MD;  Location: [place];  Service: Neurosurgery;  Laterality: N/A;  . APPLICATION OF WOUND VAC  12/23/2018   Procedure: Application Of Wound Vac;  Surgeon: Newt Minion, MD;  Location: [place];  Service: Orthopedics;;  . BACK SURGERY  2004   x 2  . BIOPSY  06/05/2019   Procedure: BIOPSY;  Surgeon: Rogene Houston, MD;  [facility];  Service: Endoscopy;;  gastric biopsy  . CERVICAL SPINE SURGERY  2008  . CHOLECYSTECTOMY N/A 07/06/2019   Procedure: LAPAROSCOPIC CHOLECYSTECTOMY;  Surgeon: Virl Cagey, MD;  [facility];  Service: General;  Laterality: N/A;  . COLONOSCOPY WITH PROPOFOL N/A 06/05/2019   Procedure: COLONOSCOPY WITH PROPOFOL;  Surgeon: Rogene Houston, MD;  [facility];  Service: Endoscopy;  Laterality: N/A;  . ESOPHAGOGASTRODUODENOSCOPY    . ESOPHAGOGASTRODUODENOSCOPY (EGD) WITH PROPOFOL N/A 06/05/2019   Procedure: ESOPHAGOGASTRODUODENOSCOPY (EGD) WITH PROPOFOL;  Surgeon: Rogene Houston, MD;  [facility];  Service: Endoscopy;  Laterality: N/A;  1220pm  . HARDWARE REMOVAL Right 10/09/2014   Procedure: Removal Deep Hardware, Irrigation and Debridement Calcaneus, Place Antibiotic Beads and Wound VAC ;  Surgeon: Newt Minion, MD;  Location: [place];  Service:  Orthopedics;  Laterality: Right;  . HARDWARE REMOVAL Right 08/12/2015   Procedure: Removal Deep Hardware Right Foot;  Surgeon: Newt Minion, MD;  Location: [place];  Service: Orthopedics;  Laterality: Right;  . HARDWARE REMOVAL Right 11/26/2018   Procedure: REMOVAL RIGHT TIBIOCALCANEAL NAIL;  Surgeon: Newt Minion, MD;  Location: [place];  Service: Orthopedics;  Laterality: Right;  . HARDWARE REMOVAL Right 12/23/2018   Procedure: RIGHT ANKLE REMOVE  HARDWARE, PLACE ANTIBIOTIC BEADS and placement of wound vac;  Surgeon: Newt Minion, MD;  Location: [place];  Service: Orthopedics;  Laterality: Right;  . HARDWARE REMOVAL Right 09/29/2019   Procedure: RIGHT ANKLE REMOVE HARDWARE, DEBRIDEMENT;  Surgeon: Newt Minion, MD;  Location: [place];  Service: Orthopedics;  Laterality: Right;  . I & D EXTREMITY Right 09/15/2014   Procedure: IRRIGATION AND DEBRIDEMENT Ankle;  Surgeon: Renette Butters, MD;  Location: [place];  Service: Orthopedics;  Laterality: Right;  . INGUINAL HERNIA REPAIR Bilateral   . LAMINECTOMY WITH POSTERIOR LATERAL ARTHRODESIS LEVEL 4 N/A 09/10/2018   Procedure: Posterior Lateral Fusion - Thoracic Eleven-Thoracic Twelve - Thoracic Twelve-Lumbar One - Lumbar One-Lumbar Two - Lumbar Two-Lumbar Three with instrumentaion and PLA;  Surgeon: Kary Kos, MD;  Location: [place];  Service: Neurosurgery;  Laterality: N/A;  Posterior Lateral Fusion - Thoracic Eleven-Thoracic Twelve - Thoracic Twelve-Lumbar One - Lumbar One-Lumbar Two - Lumbar Two-Lumbar Thre  . LIVER BIOPSY N/A 07/06/2019   Procedure: LIVER BIOPSY;  Surgeon: Virl Cagey, MD;  [facility];  Service: General;  Laterality: N/A;  . LUMBAR PERCUTANEOUS PEDICLE SCREW 1 LEVEL N/A 04/05/2017   Procedure: LUMBAR PERCUTANEOUS PEDICLE SCREW LUMBAR THREE-FOUR;  Surgeon: Kary Kos, MD;  Location: [place];  Service: Neurosurgery;  Laterality: N/A;  . ORIF CALCANEOUS FRACTURE Right 09/19/2014   Procedure: OPEN REDUCTION INTERNAL FIXATION (ORIF) CALCANEOUS FRACTURE;  Surgeon: Newt Minion, MD;  Location: [place];  Service: Orthopedics;  Laterality: Right;  . ORIF CALCANEOUS FRACTURE Left 09/19/2014   Procedure: OPEN REDUCTION INTERNAL FIXATION (ORIF) CALCANEOUS FRACTURE;  Surgeon: Newt Minion, MD;  Location: [place];  Service: Orthopedics;  Laterality: Left;  . POLYPECTOMY  06/05/2019   Procedure: POLYPECTOMY;  Surgeon: Rogene Houston, MD;  [facility];  Service: Endoscopy;;   cecal polyp biopsy forcep, ascending polyp cold snare    Family History  Problem Relation Age of Onset  . Diabetes Father   . Cancer Other    Social History:  reports that he quit smoking about 2 years ago. His smoking use included cigars. He started smoking about 43 years ago. He has a 10.00 pack-year smoking history. He has never used smokeless tobacco. He reports that he does not drink alcohol or use drugs.  Allergies:  Allergies  Allergen Reactions  . Doxycycline Nausea Only  . Levofloxacin Other (See Comments)    Blister in mouth  . Codeine Nausea And Vomiting    No medications prior to admission.    No results found for this or any previous visit (from the past 48 hour(s)). No results found.  Review of Systems  All other systems reviewed and are negative.   Height 6' (1.829 m), weight 63.5 kg. Physical Exam  Patient is alert, oriented, no adenopathy, well-dressed, normal affect, normal respiratory effort. Examination patient has no hypersensitivity to light touch no dystrophic changes there is no redness no cellulitis no signs of infection he still has a fibrous nonunion of the ankle fusion.  Patient states  he can only stand about 3 hours a day.  He states he cannot sleep due to pain at night. Heart RRR Lungs Clear Assessment/Plan Plan: Patient wants to proceed with a transtibial amputation right leg next Friday.  Discussed that the leg length inequality of approximately 2 which is can be corrected with the prosthesis.  Patient has been working with [employer] for his shoes and braces and will follow up with [employer] for prosthetic fitting.  Bevely Palmer Safir Michalec, PA 01/29/2020, 6:37 AM

## 2020-01-29 NOTE — Anesthesia Postprocedure Evaluation (Signed)
Anesthesia Post Note  Patient: Bradley Espinoza  Procedure(s) Performed: RIGHT BELOW KNEE AMPUTATION (Right Knee)     Patient location during evaluation: PACU Anesthesia Type: General Level of consciousness: awake and alert and oriented Pain management: pain level controlled Vital Signs Assessment: post-procedure vital signs reviewed and stable Respiratory status: spontaneous breathing, nonlabored ventilation and respiratory function stable Cardiovascular status: blood pressure returned to baseline and stable Postop Assessment: no apparent nausea or vomiting Anesthetic complications: no    Last Vitals:  Vitals:   01/29/20 0855 01/29/20 0900  BP: (!) 130/58 (!) 127/54  Pulse: 61 78  Resp: (!) 22 17  Temp:    SpO2: 97% 98%    Last Pain:  Vitals:   01/29/20 0900  TempSrc:   PainSc: 0-No pain                 [name] A.

## 2020-01-29 NOTE — Anesthesia Procedure Notes (Addendum)
Anesthesia Regional Block: Femoral nerve block   Pre-Anesthetic Checklist: ,, timeout performed, Correct Patient, Correct Site, Correct Laterality, Correct Procedure, Correct Position, site marked, Risks and benefits discussed,  Surgical consent,  Pre-op evaluation,  At surgeon's request and post-op pain management  Laterality: Right  Prep: chloraprep       Needles:  Injection technique: Single-shot  Needle Type: Echogenic Stimulator Needle     Needle Length: 9cm  Needle Gauge: 21     Additional Needles:   Procedures:,,,, ultrasound used (permanent image in chart),,,,  Narrative:  Start time: 01/29/2020 8:48 AM End time: 01/29/2020 8:53 AM Injection made incrementally with aspirations every 5 mL.  Performed by: Personally  Anesthesiologist: Josephine Igo, MD  Additional Notes: Timeout performed. Patient sedated. Relevant anatomy ID'd using [place]. Incremental 2-11ml injection of LA with frequent aspiration. Patient tolerated procedure well.        Right Femoral Nerve Block

## 2020-01-29 NOTE — Plan of Care (Signed)

## 2020-01-29 NOTE — Op Note (Signed)
   Date of Surgery: 01/29/2020  INDICATIONS: Bradley Espinoza is a 64 y.o.-year-old male who initially sustained an open calcaneal fracture.  He underwent open reduction internal fixation and eventually sustained chronic infection.  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  PROCEDURE: Transtibial amputation Application of Prevena wound VAC  SURGEON: Sharol Given, M.D.  ANESTHESIA:  general  IV FLUIDS AND URINE: See anesthesia.  ESTIMATED BLOOD LOSS: Minimal mL.  COMPLICATIONS: None.  DESCRIPTION OF PROCEDURE: The patient was brought to the operating room and underwent a general anesthetic. After adequate levels of anesthesia were obtained patient's lower extremity was prepped using DuraPrep draped into a sterile field. A timeout was called. The foot was draped out of the sterile field with impervious stockinette. A transverse incision was made 11 cm distal to the tibial tubercle. This curved proximally and a large posterior flap was created. The tibia was transected 1 cm proximal to the skin incision. The fibula was transected just proximal to the tibial incision. The tibia was beveled anteriorly. A large posterior flap was created. The sciatic nerve was pulled cut and allowed to retract. The vascular bundles were suture ligated with 2-0 silk. The deep and superficial fascial layers were closed using #1 Vicryl. The skin was closed using staples and 2-0 nylon. The wound was covered with a Prevena wound VAC. There was a good suction fit. A prosthetic shrinker was applied. Patient was extubated taken to the PACU in stable condition.   DISCHARGE PLANNING:  Antibiotic duration: 24 hours  Weightbearing:  Nonweightbearing on the right  Pain medication: Opioid pathway  Dressing care/ Wound VAC: Continue wound VAC for 1 week at discharge  Discharge to: Anticipate discharge to skilled nursing or inpatient rehab.  Follow-up: In the office 1 week post operative.  Bradley Score, MD [place] 12:12 PM

## 2020-01-29 NOTE — Transfer of Care (Signed)
Immediate Anesthesia Transfer of Care Note  Patient: Bradley Espinoza  Procedure(s) Performed: RIGHT BELOW KNEE AMPUTATION (Right Knee)  Patient Location: PACU  Anesthesia Type:General and Regional  Level of Consciousness: drowsy and patient cooperative  Airway & Oxygen Therapy: Patient Spontanous Breathing and Patient connected to face mask oxygen  Post-op Assessment: Report given to RN and Post -op Vital signs reviewed and stable  Post vital signs: Reviewed and stable  Last Vitals:  Vitals Value Taken Time  BP 158/83 01/29/20 1209  Temp    Pulse 62 01/29/20 1211  Resp 11 01/29/20 1211  SpO2 100 % 01/29/20 1211  Vitals shown include unvalidated device data.  Last Pain:  Vitals:   01/29/20 0900  TempSrc:   PainSc: 0-No pain         Complications: No apparent anesthesia complications

## 2020-01-29 NOTE — Progress Notes (Signed)
The patient has multiple complaints- The sister and girlfriend are at the bedside.  The patient's telephone was traded out and is presently working, the remote was traded and working.  Maintenance has been called to check out the bed due to the inner controls are not working.  This room was opened after an extended time due to covid and made into dialysis room

## 2020-01-30 MED ORDER — OXYCODONE-ACETAMINOPHEN 5-325 MG PO TABS
1.0000 | ORAL_TABLET | ORAL | Status: DC | PRN
  Administered 2020-01-30 – 2020-02-01 (×9): 2 via ORAL
  Filled 2020-01-30 (×10): qty 2

## 2020-01-30 NOTE — Evaluation (Signed)
Occupational Therapy Evaluation Patient Details Name: Bradley Espinoza MRN: [id number] DOB: Oct 20, 1955 Today's Date: 01/30/2020    History of Present Illness Pt is a 64 y/o male with PMH of depression, arthritis, anxiety, recurrent infection and fibrous nonunion of R ankle after post open calcaneous fracture. Presenting for BKA due to chronic pain, S/P R BKA 01/29/20.    Clinical Impression   PTA patient independent using w/c, RW and crutches as needed for ADLs, IADLs and mobility.  Admitted for above and limited by problem list below, including impaired balance, decreased activity tolerance, decreased safety awareness, and pain to R BKA.  Patient currently requires supervision for ADLs from seated position (lateral leans) and supervision for squat pivot transfers to recliner. He was educated on compensatory techniques for LB ADLs and safety, but noted limited acceptance of education and will need reinforcement.  Will follow acutely to optimize independence and safety with ADLs, mobility and IADLs but anticipate no further needs after dc home.     Follow Up Recommendations  No OT follow up;Supervision - Intermittent    Equipment Recommendations  None recommended by OT    Recommendations for Other Services       Precautions / Restrictions Precautions Precautions: Fall Precaution Comments: R BKA  Required Braces or Orthoses: Other Brace Other Brace: R BKA limb protector  Restrictions Weight Bearing Restrictions: Yes RLE Weight Bearing: Non weight bearing      Mobility Bed Mobility               General bed mobility comments: EOB upon entry   Transfers Overall transfer level: Needs assistance   Transfers: Squat Pivot Transfers     Squat pivot transfers: Supervision     General transfer comment: to recliner towards R side, cueing for safety     Balance Overall balance assessment: Needs assistance Sitting-balance support: No upper extremity supported;Feet  supported Sitting balance-Leahy Scale: Good                                     ADL either performed or assessed with clinical judgement   ADL Overall ADL's : Needs assistance/impaired     Grooming: Set up;Sitting   Upper Body Bathing: Set up;Sitting   Lower Body Bathing: Supervison/ safety;Sitting/lateral leans   Upper Body Dressing : Set up;Sitting   Lower Body Dressing: Supervision/safety;Sitting/lateral leans Lower Body Dressing Details (indicate cue type and reason): verbally reviewed and demonstrated technique for lateral leans and 1 handed techniques in standing- pt with limited acceptance of education Toilet Transfer: Supervision/safety;Stand-pivot Toilet Transfer Details (indicate cue type and reason): simulated to recliner          Functional mobility during ADLs: Supervision/safety;Cueing for safety General ADL Comments: pt limited by pain, poor acceptance of education and reporting "I'll just have to figure it out"      Vision         Perception     Praxis      Pertinent Vitals/Pain Pain Assessment: Faces Faces Pain Scale: Hurts little more Pain Location: R BKA  Pain Descriptors / Indicators: Discomfort;Operative site guarding Pain Intervention(s): Limited activity within patient's tolerance;Monitored during session;Repositioned     Hand Dominance Right   Extremity/Trunk Assessment Upper Extremity Assessment Upper Extremity Assessment: Overall WFL for tasks assessed   Lower Extremity Assessment Lower Extremity Assessment: Defer to PT evaluation       Communication Communication Communication: No difficulties  Cognition Arousal/Alertness: Awake/alert Behavior During Therapy: Agitated Overall Cognitive Status: Within Functional Limits for tasks assessed                                 General Comments: some decreased safety awareness with new BKA    General Comments  pt with limited participation, voices  increased frustration about "being on the 5th floor" and "not ever getting the help he needs"; pt agitated upon entering room and reports all needs met upon exit     Exercises     Shoulder Instructions      Home Living Family/patient expects to be discharged to:: Private residence Living Arrangements: Alone   Type of Home: [place]: One level     Bathroom Shower/Tub: Teacher, early years/pre: Standard     Home Equipment: Shower seat;Bedside commode;Walker - 2 wheels;Wheelchair - manual;Crutches          Prior Functioning/Environment Level of Independence: Independent with assistive device(s)        Comments: reports using w/c, cruthes and RW for mobility in and out of home, reports wc doesn't fit in bathroom         OT Problem List: Decreased activity tolerance;Decreased safety awareness;Pain;Decreased knowledge of precautions;Decreased knowledge of use of DME or AE;Impaired balance (sitting and/or standing)      OT Treatment/Interventions: Self-care/ADL training;DME and/or AE instruction;Therapeutic activities;Balance training;Patient/family education;Therapeutic exercise    OT Goals(Current goals can be found in the care plan section) Acute Rehab OT Goals Patient Stated Goal: to get home  OT Goal Formulation: With patient Time For Goal Achievement: 02/13/20 Potential to Achieve Goals: Good  OT Frequency: Min 2X/week   Barriers to D/C:            Co-evaluation              AM-PAC OT "6 Clicks" Daily Activity     Outcome Measure Help from another person eating meals?: None Help from another person taking care of personal grooming?: None Help from another person toileting, which includes using toliet, bedpan, or urinal?: A Little Help from another person bathing (including washing, rinsing, drying)?: A Little Help from another person to put on and taking off regular upper body clothing?: None Help from another person to put  on and taking off regular lower body clothing?: A Little 6 Click Score: 21   End of Session Nurse Communication: Mobility status;Precautions  Activity Tolerance: Patient tolerated treatment well Patient left: in chair;with call bell/phone within reach;with chair alarm set  OT Visit Diagnosis: Other abnormalities of gait and mobility (R26.89);Pain Pain - Right/Left: Right Pain - part of body: Leg                Time: 2820-6015 OT Time Calculation (min): 24 min Charges:  OT General Charges $OT Visit: 1 Visit OT Evaluation $OT Eval Moderate Complexity: 1 Mod OT Treatments $Self Care/Home Management : 8-22 mins  Jolaine Artist, OT Acute Rehabilitation Services Pager [phone] Office [phone]    Delight Stare 01/30/2020, 1:15 PM

## 2020-01-30 NOTE — Progress Notes (Signed)
Patient ID: Bradley Espinoza, male   DOB: 1955-10-23, 64 y.o.   MRN: [id number] Postoperative day 1 right transtibial amputation.  Patient has a limb protector from [employer].  Patient states that oxycodone does not work for him and he wants Percocet I will change the order.  Patient inquires regarding Lyrica but does not want to take it at this time.  Possible discharge on Monday.  No drainage in the wound VAC canister.

## 2020-01-30 NOTE — Evaluation (Signed)
Physical Therapy Evaluation Patient Details Name: Bradley Espinoza MRN: [id number] DOB: [birth year] Today's Date: 01/30/2020   History of Present Illness  Pt is a 64 y/o male with PMH of depression, arthritis, anxiety, recurrent infection and fibrous nonunion of R ankle after post open calcaneous fracture. Presenting for BKA due to chronic pain, S/P R BKA 01/29/20.   Clinical Impression  Pt presents to PT with deficits in gait, balance, functional mobility, activity tolerance. Pt is able to perform all mobility without physical assistance this session, needing close supervision for stair negotiation due to mild instability but no LOB. Pt utilizes RW and crutches during session for ambulation with good technique. Pt does report some phantom sensation of R limb still being present and is concerned that he will continue to attempt to utilize this LE for balance although it is not present. Pt will benefit from continued acute PT and aggressive mobilization to improve balance, stair negotiation, and restore independence in mobility. PT recommends outpatient PT for prosthetic assessment once pt is medically cleared by MD.    Follow Up Recommendations Outpatient PT(outatient PT once cleared by MD for prosthetic assessment)    Equipment Recommendations  None recommended by PT(pt owns necessary DME)    Recommendations for Other Services       Precautions / Restrictions Precautions Precautions: Fall Precaution Comments: R BKA  Required Braces or Orthoses: Other Brace Other Brace: R BKA limb protector  Restrictions Weight Bearing Restrictions: Yes RLE Weight Bearing: Non weight bearing      Mobility  Bed Mobility Overal bed mobility: Independent             General bed mobility comments: sit to supine independently  Transfers Overall transfer level: Needs assistance Equipment used: Rolling walker (2 wheeled);Crutches Transfers: Sit to/from Stand Sit to Stand: Supervision   Squat  pivot transfers: Supervision     General transfer comment: to recliner towards R side, cueing for safety   Ambulation/Gait Ambulation/Gait assistance: Supervision Gait Distance (Feet): 80 Feet(pt ambulates 20' with RW x2, 30' with crutches) Assistive device: Rolling walker (2 wheeled);Crutches Gait Pattern/deviations: (hop to gait on ::E) Gait velocity: reduced Gait velocity interpretation: <1.8 ft/sec, indicate of risk for recurrent falls General Gait Details: hop to gait, steady, pt reports he has phantom sensation of RLE still being present, normally uses this as a balance leg at home  Stairs Stairs: Yes Stairs assistance: Min guard Stair Management: No rails;Forwards;With crutches Number of Stairs: 2    Wheelchair Mobility    Modified Rankin (Stroke Patients Only)       Balance Overall balance assessment: Needs assistance Sitting-balance support: No upper extremity supported;Feet supported Sitting balance-Leahy Scale: Good Sitting balance - Comments: posterior LOB when donning socks but able to catch himself Postural control: Posterior lean Standing balance support: Bilateral upper extremity supported;During functional activity Standing balance-Leahy Scale: Good Standing balance comment: close supervision, donning mask in standing                             Pertinent Vitals/Pain Pain Assessment: Faces Faces Pain Scale: Hurts little more Pain Location: RLE Pain Descriptors / Indicators: Grimacing Pain Intervention(s): Monitored during session    Home Living Family/patient expects to be discharged to:: Private residence Living Arrangements: Alone Available Help at Discharge: Family;Available PRN/intermittently;Friend(s) Type of Home: House Home Access: Stairs to enter Entrance Stairs-Rails: None Entrance Stairs-Number of Steps: 1 front, 4 back Home Layout: One level Home Equipment: Shower  seat;Bedside commode;Walker - 2 wheels;Wheelchair -  manual;Crutches      Prior Function Level of Independence: Independent with assistive device(s)         Comments: reports using w/c, cruthes and RW for mobility in and out of home, reports wc doesn't fit in bathroom      Hand Dominance   Dominant Hand: Right    Extremity/Trunk Assessment   Upper Extremity Assessment Upper Extremity Assessment: Overall WFL for tasks assessed    Lower Extremity Assessment Lower Extremity Assessment: RLE deficits/detail RLE Deficits / Details: pt reports numbness in RLE, possibly still due to nerve block from surgery    Cervical / Trunk Assessment Cervical / Trunk Assessment: Normal  Communication   Communication: No difficulties  Cognition Arousal/Alertness: Awake/alert Behavior During Therapy: WFL for tasks assessed/performed Overall Cognitive Status: Within Functional Limits for tasks assessed                                 General Comments: some decreased safety awareness with new BKA       General Comments General comments (skin integrity, edema, etc.): VSS on RA    Exercises     Assessment/Plan    PT Assessment Patient needs continued PT services  PT Problem List Decreased activity tolerance;Decreased balance;Decreased mobility;Decreased knowledge of use of DME;Decreased safety awareness;Pain       PT Treatment Interventions DME instruction;Gait training;Stair training;Functional mobility training;Therapeutic activities;Therapeutic exercise;Balance training;Neuromuscular re-education;Patient/family education    PT Goals (Current goals can be found in the Care Plan section)  Acute Rehab PT Goals Patient Stated Goal: to go home PT Goal Formulation: With patient Time For Goal Achievement: 02/13/20 Potential to Achieve Goals: Good Additional Goals Additional Goal #1: Pt will maintain dynamic standing balance within 10 inches of his base of support with unilateral UE support of the LRAD, modI    Frequency  Min 3X/week   Barriers to discharge        Co-evaluation               AM-PAC PT "6 Clicks" Mobility  Outcome Measure Help needed turning from your back to your side while in a flat bed without using bedrails?: None Help needed moving from lying on your back to sitting on the side of a flat bed without using bedrails?: None Help needed moving to and from a bed to a chair (including a wheelchair)?: None Help needed standing up from a chair using your arms (e.g., wheelchair or bedside chair)?: None Help needed to walk in hospital room?: None Help needed climbing 3-5 steps with a railing? : A Little 6 Click Score: 23    End of Session Equipment Utilized During Treatment: Other (comment)(R limb protector) Activity Tolerance: Patient tolerated treatment well Patient left: in bed;with call bell/phone within reach;with bed alarm set Nurse Communication: Mobility status PT Visit Diagnosis: Other abnormalities of gait and mobility (R26.89)    Time: [id number] PT Time Calculation (min) (ACUTE ONLY): 26 min   Charges:   PT Evaluation $PT Eval Low Complexity: 1 Low PT Treatments $Gait Training: 8-22 mins        Zenaida Niece, PT, DPT Acute Rehabilitation Pager: [phone]   Zenaida Niece 01/30/2020, 4:09 PM

## 2020-01-31 NOTE — Progress Notes (Signed)
Subjective: 2 Days Post-Op Procedure(s) (LRB): RIGHT BELOW KNEE AMPUTATION (Right) Patient reports pain as moderate.    Objective: Vital signs in last 24 hours: Temp:  [97.9 F (36.6 C)-98.3 F (36.8 C)] 97.9 F (36.6 C) (04/25 0454) Pulse Rate:  [57-65] 57 (04/25 0454) Resp:  [16-18] 16 (04/25 0454) BP: (113-139)/(67-75) 139/75 (04/25 0454) SpO2:  [95 %-97 %] 95 % (04/25 0454)  Intake/Output from previous day: 04/24 0701 - 04/25 0700 In: 1050 [P.O.:960; I.V.:90] Out: 1800 [Urine:1800] Intake/Output this shift: Total I/O In: 360 [P.O.:360] Out: -   Recent Labs    01/29/20 0803  HGB 15.2   Recent Labs    01/29/20 0803  WBC 6.7  RBC 5.34  HCT 48.1  PLT 221   No results for input(s): NA, K, CL, CO2, BUN, CREATININE, GLUCOSE, CALCIUM in the last 72 hours. No results for input(s): LABPT, INR in the last 72 hours.  Incision: dressing C/D/I No cellulitis present Compartment soft  Wound vac functioning, but nothing in canister   Assessment/Plan: 2 Days Post-Op Procedure(s) (LRB): RIGHT BELOW KNEE AMPUTATION (Right) Up with therapy  NWB RLE Continue stump shrinker Continue wound vac D/C dispo likely monday      Bradley Espinoza 01/31/2020, 9:36 AM

## 2020-02-01 LAB — SURGICAL PATHOLOGY

## 2020-02-01 MED ORDER — PREGABALIN 50 MG PO CAPS
50.0000 mg | ORAL_CAPSULE | Freq: Three times a day (TID) | ORAL | 2 refills | Status: DC
Start: 2020-02-01 — End: 2020-12-28

## 2020-02-01 MED ORDER — OXYCODONE-ACETAMINOPHEN 10-325 MG PO TABS: 1.0000 | ORAL_TABLET | ORAL | 0 refills | Status: AC | PRN

## 2020-02-01 MED ORDER — ASPIRIN 325 MG PO TBEC: 325.0000 mg | DELAYED_RELEASE_TABLET | Freq: Every day | ORAL | 0 refills | Status: DC

## 2020-02-01 NOTE — Progress Notes (Signed)
Pt left facility via private transport wheeled by staff. Alert/orented in no apparent distress.with portable wound vac attached. No drainage noted. No complaints voiced.

## 2020-02-01 NOTE — Care Management Important Message (Signed)
Important Message  Patient Details  Name: Bradley Espinoza MRN: [id number] Date of Birth: 1955/12/21   Medicare Important Message Given:  Yes  Patient left prior to IM delivery. Important Message mailed to the patient home address.     Drezden Seitzinger 02/01/2020, 3:44 PM

## 2020-02-01 NOTE — Progress Notes (Signed)
Physical Therapy Treatment Patient Details Name: AYMEN PRESZLER MRN: [id number] DOB: 11/04/1955 Today's Date: 02/01/2020    History of Present Illness Pt is a 64 y/o male with PMH of depression, arthritis, anxiety, recurrent infection and fibrous nonunion of R ankle after post open calcaneous fracture. Presenting for BKA due to chronic pain, S/P R BKA 01/29/20.     PT Comments    Pt supine in bed on arrival.  Assisted patient with prevena wound vac and performed gt, standing exercises and stair training.  He has minimal balance disturbances climbing with crutches but is insistent on doing it with crutches vs. RW.  Plan for d/c home today with f/u outpatient once cleared for prosthesis.  Educated on having support when climbing stairs due to poor balance and educated in the importance of hip extension.  Pt left with OT who dovetailed the session.     Follow Up Recommendations  Outpatient PT(once cleared for prosthetic assessment)     Equipment Recommendations  None recommended by PT(pt owns necessary DME)    Recommendations for Other Services       Precautions / Restrictions Precautions Precautions: Fall Precaution Comments: R BKA  Required Braces or Orthoses: Other Brace Other Brace: R BKA limb protector  Restrictions Weight Bearing Restrictions: Yes RLE Weight Bearing: Non weight bearing    Mobility  Bed Mobility Overal bed mobility: Independent                Transfers Overall transfer level: Needs assistance Equipment used: Rolling walker (2 wheeled) Transfers: Sit to/from Stand Sit to Stand: Supervision   Squat pivot transfers: Supervision     General transfer comment: supervision for safety  Ambulation/Gait Ambulation/Gait assistance: Supervision Gait Distance (Feet): 120 Feet Assistive device: Rolling walker (2 wheeled) Gait Pattern/deviations: Step-to pattern(hop to pattern) Gait velocity: reduced   General Gait Details: hop to gait, steady, pt  reports he has phantom sensation of RLE still being present, normally uses this as a balance leg at home   Stairs Stairs: Yes Stairs assistance: Min guard;Min assist Stair Management: No rails;Forwards;With crutches;With walker Number of Stairs: 2(also performed x1 curb training with RW) General stair comments: Pt required cues for sequencing and safety.  No LOB with RW but with crutches slightly unsteady and required assistance to right balance.  Educated to have support at home when climbing with crutches.   Wheelchair Mobility    Modified Rankin (Stroke Patients Only)       Balance Overall balance assessment: Needs assistance Sitting-balance support: No upper extremity supported;Feet supported Sitting balance-Leahy Scale: Good       Standing balance-Leahy Scale: Good                              Cognition Arousal/Alertness: Awake/alert Behavior During Therapy: WFL for tasks assessed/performed Overall Cognitive Status: Within Functional Limits for tasks assessed                                 General Comments: some decreased safety awareness with new BKA       Exercises Amputee Exercises Hip Extension: AROM;Right;10 reps;Standing    General Comments        Pertinent Vitals/Pain Pain Assessment: Faces Faces Pain Scale: Hurts a little bit Pain Location: RLE Pain Descriptors / Indicators: Grimacing Pain Intervention(s): Monitored during session;Repositioned    Home Living  Prior Function            PT Goals (current goals can now be found in the care plan section) Acute Rehab PT Goals Patient Stated Goal: go home today PT Goal Formulation: With patient Potential to Achieve Goals: Good Additional Goals Additional Goal #1: Pt will maintain dynamic standing balance within 10 inches of his base of support with unilateral UE support of the LRAD, modI Progress towards PT goals: Progressing toward  goals    Frequency    Min 3X/week      PT Plan Current plan remains appropriate    Co-evaluation              AM-PAC PT "6 Clicks" Mobility   Outcome Measure  Help needed turning from your back to your side while in a flat bed without using bedrails?: None Help needed moving from lying on your back to sitting on the side of a flat bed without using bedrails?: None Help needed moving to and from a bed to a chair (including a wheelchair)?: None Help needed standing up from a chair using your arms (e.g., wheelchair or bedside chair)?: None Help needed to walk in hospital room?: None Help needed climbing 3-5 steps with a railing? : A Little 6 Click Score: 23    End of Session Equipment Utilized During Treatment: (R limb protector) Activity Tolerance: Patient tolerated treatment well Patient left: in bed;with call bell/phone within reach;with bed alarm set Nurse Communication: Mobility status PT Visit Diagnosis: Other abnormalities of gait and mobility (R26.89)     Time: [id number] PT Time Calculation (min) (ACUTE ONLY): 25 min  Charges:  $Gait Training: 8-22 mins $Therapeutic Activity: 8-22 mins                     Erasmo Leventhal , PTA Acute Rehabilitation Services Pager [phone] Office [phone]     Cabot Cromartie Eli Hose 02/01/2020, 3:17 PM

## 2020-02-01 NOTE — Progress Notes (Signed)
Occupational Therapy Treatment Patient Details Name: Bradley Espinoza MRN: [id number] DOB: 1956-02-24 Today's Date: 02/01/2020    History of present illness Pt is a 64 y/o male with PMH of depression, arthritis, anxiety, recurrent infection and fibrous nonunion of R ankle after post open calcaneous fracture. Presenting for BKA due to chronic pain, S/P R BKA 01/29/20.    OT comments  Pt continuing to progress toward established OT goals. Pt requires supervision for functional mobility at RW level, reviewed tub transfers and safety with ADL/IADL completion. Pt requires supervision for LB dressing. Educated pt on [employer]. Pt will continue to benefit from skilled OT services to maximize safety and independence with ADL/IADL and functional mobility. Will continue to follow acutely and progress as tolerated.    Follow Up Recommendations  No OT follow up;Supervision - Intermittent    Equipment Recommendations  None recommended by OT    Recommendations for Other Services      Precautions / Restrictions Precautions Precautions: Fall Precaution Comments: R BKA  Required Braces or Orthoses: Other Brace Other Brace: R BKA limb protector  Restrictions Weight Bearing Restrictions: Yes RLE Weight Bearing: Non weight bearing       Mobility Bed Mobility               General bed mobility comments: up with PT upon arrival  Transfers Overall transfer level: Needs assistance Equipment used: Rolling walker (2 wheeled);Crutches Transfers: Sit to/from Stand Sit to Stand: Supervision         General transfer comment: supervision for safety    Balance Overall balance assessment: Needs assistance Sitting-balance support: No upper extremity supported;Feet supported Sitting balance-Leahy Scale: Good     Standing balance support: Single extremity supported;During functional activity Standing balance-Leahy Scale: Good Standing balance comment: closer supervision                            ADL either performed or assessed with clinical judgement   ADL Overall ADL's : Needs assistance/impaired     Grooming: Supervision/safety;Standing               Lower Body Dressing: Supervision/safety;Sitting/lateral leans Lower Body Dressing Details (indicate cue type and reason): supervision for donning/doffing limb guard Toilet Transfer: Supervision/safety;Ambulation;RW Toilet Transfer Details (indicate cue type and reason): onto standard toilet Toileting- Clothing Manipulation and Hygiene: Supervision/safety       Functional mobility during ADLs: Supervision/safety;Cueing for safety General ADL Comments: decreased interest in education and verbal instructions for tub transfer and decreased awareness of safety     Vision       Perception     Praxis      Cognition Arousal/Alertness: Awake/alert Behavior During Therapy: WFL for tasks assessed/performed Overall Cognitive Status: Within Functional Limits for tasks assessed                                 General Comments: some decreased safety awareness with new BKA         Exercises Exercises: Amputee;Other exercises Amputee Exercises Gluteal Sets: AROM;10 reps;Standing Hip Extension: AROM;10 reps;Standing Hip ABduction/ADduction: AROM;10 reps;Standing Other Exercises Other Exercises: provided pt with written HEP for amputee exercises   Shoulder Instructions       General Comments vss    Pertinent Vitals/ Pain       Pain Assessment: Faces Faces Pain Scale: Hurts a little bit Pain Location: RLE Pain Descriptors /  Indicators: Grimacing Pain Intervention(s): Monitored during session;Limited activity within patient's tolerance  Home Living                                          Prior Functioning/Environment              Frequency  Min 2X/week        Progress Toward Goals  OT Goals(current goals can now be found in the care plan  section)  Progress towards OT goals: Progressing toward goals  Acute Rehab OT Goals Patient Stated Goal: go home today OT Goal Formulation: With patient Time For Goal Achievement: 02/13/20 Potential to Achieve Goals: Good ADL Goals Pt Will Perform Grooming: with modified independence;standing;sitting Pt Will Perform Lower Body Dressing: sit to/from stand;with modified independence;sitting/lateral leans Pt Will Transfer to Toilet: with modified independence;ambulating;bedside commode Pt Will Perform Toileting - Clothing Manipulation and hygiene: with modified independence;sitting/lateral leans;sit to/from stand  Plan Discharge plan remains appropriate    Co-evaluation                 AM-PAC OT "6 Clicks" Daily Activity     Outcome Measure   Help from another person eating meals?: None Help from another person taking care of personal grooming?: A Little Help from another person toileting, which includes using toliet, bedpan, or urinal?: A Little Help from another person bathing (including washing, rinsing, drying)?: A Little Help from another person to put on and taking off regular upper body clothing?: None Help from another person to put on and taking off regular lower body clothing?: A Little 6 Click Score: 20    End of Session Equipment Utilized During Treatment: Gait belt;Rolling walker  OT Visit Diagnosis: Other abnormalities of gait and mobility (R26.89);Pain Pain - Right/Left: Right Pain - part of body: Leg   Activity Tolerance Patient tolerated treatment well   Patient Left with call bell/phone within reach;in bed   Nurse Communication Mobility status;Precautions        Time: [id number] OT Time Calculation (min): 10 min  Charges: OT General Charges $OT Visit: 1 Visit OT Treatments $Self Care/Home Management : 8-22 mins  Helene Kelp OTR/L Acute Rehabilitation Services Office: [place] 02/01/2020, 11:31 AM

## 2020-02-01 NOTE — Progress Notes (Signed)
Discharge summary provided to pt. Educated pt and verbalized understanding No complaints.

## 2020-02-01 NOTE — Progress Notes (Signed)
Patient is postop day 3 status post below-knee amputation.  He is doing well and pain is controlled.  He is requesting discharge this morning if possible  Vital signs stable afebrile alert pleasant to exam 0 cc in VAC canister.  Will transition to Praveena canister and patient will be discharged on Percocet.  Follow-up in office in 1 week for [facility] removal

## 2020-02-01 NOTE — Discharge Summary (Signed)
Discharge Diagnoses:  Active Problems:   Post-traumatic arthritis of ankle, right   Acute hematogenous osteomyelitis of right foot [facility])   Surgeries: Procedure(s): RIGHT BELOW KNEE AMPUTATION on 01/29/2020    Consultants:   Discharged Condition: Improved  Hospital Course: Bradley Espinoza is an 64 y.o. male who was admitted 01/29/2020 with a chief complaint of Right Ankle Pain , with a final diagnosis of Non-Union Right Ankle Fusion.  Patient was brought to the operating room on 01/29/2020 and underwent Procedure(s): RIGHT BELOW KNEE AMPUTATION.    Patient was given perioperative antibiotics:  Anti-infectives (From admission, onward)   Start     Dose/Rate Route Frequency Ordered Stop   01/29/20 1800  ceFAZolin (ANCEF) IVPB 1 g/50 mL premix     1 g 100 mL/hr over 30 Minutes Intravenous Every 6 hours 01/29/20 1432 01/30/20 0538   01/29/20 0800  ceFAZolin (ANCEF) IVPB 2g/100 mL premix     2 g 200 mL/hr over 30 Minutes Intravenous On call to O.R. 01/29/20 0756 01/29/20 1157    .  Patient was given sequential compression devices, early ambulation, and aspirin for DVT prophylaxis.  Recent vital signs:  Patient Vitals for the past 24 hrs:  BP Temp Temp src Pulse Resp SpO2  02/01/20 0654 131/70 98.5 F (36.9 C) Oral (!) 109 20 94 %  02/01/20 0447 (!) 143/70 98.2 F (36.8 C) Oral 60 16 96 %  01/31/20 1955 138/70 98.6 F (37 C) Oral 73 16 97 %  01/31/20 1435 134/78 98.1 F (36.7 C) Oral 80 18 99 %  .  Recent laboratory studies: No results found.  Discharge Medications:   Allergies as of 02/01/2020      Reactions   Doxycycline Nausea Only   Levofloxacin Other (See Comments)   Blister in mouth   Codeine Nausea And Vomiting      Medication List    STOP taking these medications   gabapentin 300 MG capsule Commonly known as: NEURONTIN   ketorolac 10 MG tablet Commonly known as: TORADOL     TAKE these medications   albuterol 108 (90 Base) MCG/ACT inhaler Commonly  known as: VENTOLIN HFA Inhale 2 puffs into the lungs every 4 (four) hours as needed.   aspirin 325 MG EC tablet Take 1 tablet (325 mg total) by mouth daily.   Coral Calcium 1000 (390 Ca) MG Tabs Take 1,000 mg by mouth daily.   docusate sodium 100 MG capsule Commonly known as: COLACE Take 100 mg by mouth daily as needed for mild constipation.   omeprazole 40 MG capsule Commonly known as: PRILOSEC TAKE 1 CAPSULE BY MOUTH ONCE DAILY.   ondansetron 4 MG tablet Commonly known as: ZOFRAN Take 1 tablet (4 mg total) by mouth every 8 (eight) hours as needed for refractory nausea / vomiting.   oxyCODONE-acetaminophen 10-325 MG tablet Commonly known as: PERCOCET Take 1 tablet by mouth every 4 (four) hours as needed for pain. What changed:   when to take this  reasons to take this   pregabalin 50 MG capsule Commonly known as: Lyrica Take 1 capsule (50 mg total) by mouth 3 (three) times daily.   tamsulosin 0.4 MG Caps capsule Commonly known as: FLOMAX Take 0.8 mg by mouth daily.   testosterone cypionate 200 MG/ML injection Commonly known as: DEPOTESTOSTERONE CYPIONATE Inject 200 mg into the muscle every 14 (fourteen) days.   vitamin C 500 MG tablet Commonly known as: ASCORBIC ACID Take 500 mg by mouth daily.   Vitamin D  50 MCG (2000 UT) Caps Take 2,000 Units by mouth daily.   zinc gluconate 50 MG tablet Take 50 mg by mouth daily.       Diagnostic Studies: No results found.  Patient benefited maximally from their hospital stay and there were no complications.     Disposition: Discharge disposition: 01-Home or Self Care      Discharge Instructions    Call MD / Call 911   Complete by: As directed    If you experience chest pain or shortness of breath, CALL 911 and be transported to the hospital emergency room.  If you develope a fever above 101 F, pus (white drainage) or increased drainage or redness at the wound, or calf pain, call your surgeon's office.    Constipation Prevention   Complete by: As directed    Drink plenty of fluids.  Prune juice may be helpful.  You may use a stool softener, such as Colace (over the counter) 100 mg twice a day.  Use MiraLax (over the counter) for constipation as needed.   Diet - low sodium heart healthy   Complete by: As directed    Discharge instructions   Complete by: As directed    Keep dressing dry . Follow up with PA/NP   Increase activity slowly as tolerated   Complete by: As directed    Negative Pressure Wound Therapy - Incisional   Complete by: As directed    Show patient how to attach prevena pump     Follow-up Information    Bradley Espinoza, Bradley Espinoza, [place].   Specialty: Orthopedic Surgery Contact information: [address] [place] [address] [phone]            Signed: Bevely Espinoza Waleed Dettman 02/01/2020, 7:27 AM

## 2020-02-01 NOTE — Plan of Care (Signed)

## 2020-02-05 ENCOUNTER — Ambulatory Visit (INDEPENDENT_AMBULATORY_CARE_PROVIDER_SITE_OTHER): Payer: PPO | Admitting: Physician Assistant

## 2020-02-05 ENCOUNTER — Other Ambulatory Visit: Payer: Self-pay

## 2020-02-05 ENCOUNTER — Encounter: Payer: Self-pay | Admitting: Physician Assistant

## 2020-02-05 VITALS — Ht 72.0 in | Wt 140.0 lb

## 2020-02-05 DIAGNOSIS — Z89511 Acquired absence of right leg below knee: Secondary | ICD-10-CM

## 2020-02-05 NOTE — Progress Notes (Signed)
Office Visit Note   Patient: Bradley Espinoza           Date of Birth: 1956/05/12           MRN: [id number] Visit Date: 02/05/2020              Requested by: Celene Squibb, MD [place],  [place] [address] PCP: Celene Squibb, MD  Chief Complaint  Patient presents with  . Right Leg - Routine Post Op    01/29/20 right BKA d/c'd vac zero drainage in canister       HPI: The patient presents today 1 week status post right below-knee amputation overall he is doing well.  He does have some phantom pain occasionally.  Otherwise his pain is very well controlled  Assessment & Plan: Visit Diagnoses: No diagnosis found.  Plan: He may shower daily.  I placed a dressing on his stump today but he may change over to a shrinker tomorrow when he showers and change it daily he will follow-up in 1 week.  Follow-Up Instructions: No follow-ups on file.   Ortho Exam  Patient is alert, oriented, no adenopathy, well-dressed, normal affect, normal respiratory effort. Focused examination demonstrates swelling is very well controlled.  Well apposed wound edges.  Some minimal bleeding once the [place] was removed.  Very healthy skin.  Some bruising at the end of the stump as would be expected.  No cellulitis no fluctuance no foul odor  Imaging: No results found. No images are attached to the encounter.  Labs: Lab Results  Component Value Date   HGBA1C 5.1 06/26/2016   ESRSEDRATE 9 12/08/2018   REPTSTATUS 10/04/2019 FINAL 09/29/2019   GRAMSTAIN  09/29/2019    ABUNDANT WBC PRESENT, PREDOMINANTLY PMN FEW GRAM POSITIVE COCCI    CULT  09/29/2019    FEW STAPHYLOCOCCUS AUREUS NO ANAEROBES ISOLATED Performed at [facility], [place] [address], [place], [place] [address]    LABORGA STAPHYLOCOCCUS AUREUS 09/29/2019     Lab Results  Component Value Date   ALBUMIN 4.4 07/02/2019   ALBUMIN 4.3 08/12/2015   ALBUMIN 4.1 05/06/2015    No results found for: MG No results found for:  VD25OH  No results found for: PREALBUMIN CBC EXTENDED Latest Ref Rng & Units 01/29/2020 09/29/2019 09/09/2019  WBC 4.0 - 10.5 K/uL 6.7 6.8 6.0  RBC 4.22 - 5.81 MIL/uL 5.34 4.67 5.72  HGB 13.0 - 17.0 g/dL 15.2 13.1 16.4  HCT 39.0 - 52.0 % 48.1 42.0 51.1  PLT 150 - 400 K/uL 221 305 232  NEUTROABS 1.7 - 7.7 K/uL - - -  LYMPHSABS 0.7 - 4.0 K/uL - - -     Body mass index is 18.99 kg/m.  Orders:  No orders of the defined types were placed in this encounter.  No orders of the defined types were placed in this encounter.    Procedures: No procedures performed  Clinical Data: No additional findings.  ROS:  All other systems negative, except as noted in the HPI. Review of Systems  Objective: Vital Signs: Ht 6' (1.829 m)   Wt 140 lb (63.5 kg)   BMI 18.99 kg/m   Specialty Comments:  No specialty comments available.  PMFS History: Patient Active Problem List   Diagnosis Date Noted  . Acute hematogenous osteomyelitis of right foot ([place]) 01/29/2020  . Post-traumatic arthritis of ankle, right   . Cutaneous abscess of right ankle   . Subacute osteomyelitis of right tibia (  [place])   . Fatty liver 06/30/2019  . Calculus of gallbladder without cholecystitis without obstruction 06/30/2019  . Nausea without vomiting 05/13/2019  . Abdominal bloating 05/13/2019  . Wound infection following procedure 12/19/2018  . Malunion of joint fusion ([place]) 11/26/2018  . 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[facility])   . Status post lumbar spinal fusion 09/10/2018  . S/P ankle fusion 11/27/2017  . Nonunion of subtalar arthrodesis   . Avascular necrosis of talus, right ([place])   . Post-traumatic osteoarthritis, left ankle and foot 10/22/2017  . DDD (degenerative disc disease), lumbar 04/05/2017  . Fracture of L2 vertebra ([place]) 01/12/2015  . Acute osteomyelitis of calcaneum ([place]) 10/09/2014  . Dysuria 10/05/2014  . Cellulitis 10/05/2014  . Fall from ladder 09/21/2014  . L2 vertebral fracture ([place]) 09/21/2014  .  Bilateral calcaneal fractures 09/21/2014  . Chronic pain 09/21/2014  . Acute blood loss anemia 09/21/2014  . Open fracture of both calcanei 09/15/2014   Past Medical History:  Diagnosis Date  . Anxiety   . Arthritis    low back pain, lumbar radiculopathy  . Depression   . Fatty liver   . Fracture    B/L ankles  . GERD (gastroesophageal reflux disease)   . Headache(784.0)    allergy related   . History of kidney stones    passed stones  . History of stomach ulcers   . Retained orthopedic hardware    failed retained hardware right foot  . Wears glasses   . Wears glasses   . Wears partial dentures    upper    Family History  Problem Relation Age of Onset  . Diabetes Father   . Cancer Other     Past Surgical History:  Procedure Laterality Date  . AMPUTATION Right 01/29/2020   Procedure: RIGHT BELOW KNEE AMPUTATION;  Surgeon: Newt Minion, MD;  Location: [facility];  Service: Orthopedics;  Laterality: Right;  . ANKLE FUSION Right 05/11/2015   Procedure: Right Posterior Arthroscopic Subtalar Arthrodesis;  Surgeon: Newt Minion, MD;  Location: [place] [facility];  Service: Orthopedics;  Laterality: Right;  . ANKLE FUSION Right 11/27/2017   Procedure: RIGHT TIBIOCALCANEAL FUSION;  Surgeon: Newt Minion, MD;  Location: [facility];  Service: Orthopedics;  Laterality: Right;  . ANKLE FUSION Right 11/26/2018   Procedure: RIGHT ANTERIOR ANKLE FUSION, APPLY VAC;  Surgeon: Newt Minion, MD;  Location: [facility];  Service: Orthopedics;  Laterality: Right;  . ANKLE FUSION Right 09/09/2019   Procedure: REVISION RIGHT ANKLE FUSION;  Surgeon: Newt Minion, MD;  Location: [facility];  Service: Orthopedics;  Laterality: Right;  . ANTERIOR LAT LUMBAR FUSION N/A 04/05/2017   Procedure: Extreme Lateral Interbody Fusion - Lumbar three-lumbar four ,exploration of fusion Posterior augmentation with globus addition Removal hardware Lumbar one-three. Lumbar four-sacral one,  Removal internal bone growth stimulator;  Surgeon: Kary Kos, MD;  Location: [facility];  Service: Neurosurgery;  Laterality: N/A;  . APPLICATION OF WOUND VAC  12/23/2018   Procedure: Application Of Wound Vac;  Surgeon: Newt Minion, MD;  Location: [facility];  Service: Orthopedics;;  . BACK SURGERY  2004   x 2  . BIOPSY  06/05/2019   Procedure: BIOPSY;  Surgeon: Rogene Houston, MD;  [facility];  Service: Endoscopy;;  gastric biopsy  . CERVICAL SPINE SURGERY  2008  . CHOLECYSTECTOMY N/A 07/06/2019   Procedure: LAPAROSCOPIC CHOLECYSTECTOMY;  Surgeon: Virl Cagey, MD;  [facility];  Service: General;  Laterality: N/A;  . COLONOSCOPY WITH  PROPOFOL N/A 06/05/2019   Procedure: COLONOSCOPY WITH PROPOFOL;  Surgeon: Rogene Houston, MD;  [facility];  Service: Endoscopy;  Laterality: N/A;  . ESOPHAGOGASTRODUODENOSCOPY    . ESOPHAGOGASTRODUODENOSCOPY (EGD) WITH PROPOFOL N/A 06/05/2019   Procedure: ESOPHAGOGASTRODUODENOSCOPY (EGD) WITH PROPOFOL;  Surgeon: Rogene Houston, MD;  [facility];  Service: Endoscopy;  Laterality: N/A;  1220pm  . HARDWARE REMOVAL Right 10/09/2014   Procedure: Removal Deep Hardware, Irrigation and Debridement Calcaneus, Place Antibiotic Beads and Wound VAC ;  Surgeon: Newt Minion, MD;  Location: [facility];  Service: Orthopedics;  Laterality: Right;  . HARDWARE REMOVAL Right 08/12/2015   Procedure: Removal Deep Hardware Right Foot;  Surgeon: Newt Minion, MD;  Location: [facility];  Service: Orthopedics;  Laterality: Right;  . HARDWARE REMOVAL Right 11/26/2018   Procedure: REMOVAL RIGHT TIBIOCALCANEAL NAIL;  Surgeon: Newt Minion, MD;  Location: [facility];  Service: Orthopedics;  Laterality: Right;  . HARDWARE REMOVAL Right 12/23/2018   Procedure: RIGHT ANKLE REMOVE HARDWARE, PLACE ANTIBIOTIC BEADS and placement of wound vac;  Surgeon: Newt Minion, MD;  Location: [facility];  Service: Orthopedics;  Laterality: Right;  . HARDWARE REMOVAL Right 09/29/2019   Procedure: RIGHT ANKLE REMOVE HARDWARE, DEBRIDEMENT;   Surgeon: Newt Minion, MD;  Location: [place];  Service: Orthopedics;  Laterality: Right;  . I & D EXTREMITY Right 09/15/2014   Procedure: IRRIGATION AND DEBRIDEMENT Ankle;  Surgeon: Renette Butters, MD;  Location: [facility];  Service: Orthopedics;  Laterality: Right;  . INGUINAL HERNIA REPAIR Bilateral   . LAMINECTOMY WITH POSTERIOR LATERAL ARTHRODESIS LEVEL 4 N/A 09/10/2018   Procedure: Posterior Lateral Fusion - Thoracic Eleven-Thoracic Twelve - Thoracic Twelve-Lumbar One - Lumbar One-Lumbar Two - Lumbar Two-Lumbar Three with instrumentaion and PLA;  Surgeon: Kary Kos, MD;  Location: [facility];  Service: Neurosurgery;  Laterality: N/A;  Posterior Lateral Fusion - Thoracic Eleven-Thoracic Twelve - Thoracic Twelve-Lumbar One - Lumbar One-Lumbar Two - Lumbar Two-Lumbar Thre  . LIVER BIOPSY N/A 07/06/2019   Procedure: LIVER BIOPSY;  Surgeon: Virl Cagey, MD;  [facility];  Service: General;  Laterality: N/A;  . LUMBAR PERCUTANEOUS PEDICLE SCREW 1 LEVEL N/A 04/05/2017   Procedure: LUMBAR PERCUTANEOUS PEDICLE SCREW LUMBAR THREE-FOUR;  Surgeon: Kary Kos, MD;  Location: [facility];  Service: Neurosurgery;  Laterality: N/A;  . ORIF CALCANEOUS FRACTURE Right 09/19/2014   Procedure: OPEN REDUCTION INTERNAL FIXATION (ORIF) CALCANEOUS FRACTURE;  Surgeon: Newt Minion, MD;  Location: [facility];  Service: Orthopedics;  Laterality: Right;  . ORIF CALCANEOUS FRACTURE Left 09/19/2014   Procedure: OPEN REDUCTION INTERNAL FIXATION (ORIF) CALCANEOUS FRACTURE;  Surgeon: Newt Minion, MD;  Location: [facility];  Service: Orthopedics;  Laterality: Left;  . POLYPECTOMY  06/05/2019   Procedure: POLYPECTOMY;  Surgeon: Rogene Houston, MD;  [facility];  Service: Endoscopy;;  cecal polyp biopsy forcep, ascending polyp cold snare   Social History   Occupational History    Comment: disabled  Tobacco Use  . Smoking status: Former Smoker    Packs/day: 1.00    Years: 10.00    Pack years: 10.00    Types: Cigars     Start date: 10/08/1976    Quit date: 09/2017    Years since quitting: 2.4  . Smokeless tobacco: Never Used  . Tobacco comment: quit smoking cegar 09/10/2018  Substance and Sexual Activity  . Alcohol use: No    Comment:  quit 1998  . Drug use: No  .  Sexual activity: Yes    Birth control/protection: None

## 2020-02-12 ENCOUNTER — Other Ambulatory Visit: Payer: Self-pay

## 2020-02-12 ENCOUNTER — Ambulatory Visit (INDEPENDENT_AMBULATORY_CARE_PROVIDER_SITE_OTHER): Payer: PPO | Admitting: Physician Assistant

## 2020-02-12 ENCOUNTER — Encounter: Payer: Self-pay | Admitting: Physician Assistant

## 2020-02-12 VITALS — Ht 72.0 in | Wt 140.0 lb

## 2020-02-12 DIAGNOSIS — M47816 Spondylosis without myelopathy or radiculopathy, lumbar region: Secondary | ICD-10-CM | POA: Diagnosis not present

## 2020-02-12 DIAGNOSIS — M86261 Subacute osteomyelitis, right tibia and fibula: Secondary | ICD-10-CM

## 2020-02-12 DIAGNOSIS — S32001A Stable burst fracture of unspecified lumbar vertebra, initial encounter for closed fracture: Secondary | ICD-10-CM | POA: Diagnosis not present

## 2020-02-12 DIAGNOSIS — Z981 Arthrodesis status: Secondary | ICD-10-CM | POA: Diagnosis not present

## 2020-02-12 DIAGNOSIS — Z89511 Acquired absence of right leg below knee: Secondary | ICD-10-CM | POA: Diagnosis not present

## 2020-02-12 NOTE — Progress Notes (Signed)
Office Visit Note   Patient: Bradley Espinoza           Date of Birth: 1956-04-08           MRN: [id number] Visit Date: 02/12/2020              Requested by: Celene Squibb, MD [place],  [place] [address] PCP: Celene Squibb, MD  Chief Complaint  Patient presents with  . Right Leg - Routine Post Op    01/29/20 right BKA       HPI: This is a pleasant 64 year old gentleman who is now 2 weeks status post below-knee amputation.  He is doing well and is on crutches.  He is anxious to begin fabrication of his prosthetic.  His biggest complaint is of ongoing nerve pain.  He is taking Lyrica which he finds helpful 50 mg 3 times daily  Assessment & Plan: Visit Diagnoses: No diagnosis found.  Plan: He will continue with his shrinker.  Plan will be to remove staples next week.  He is already spoken with [employer].  He will increase his Lyrica to 100 mg 3 times daily  Follow-Up Instructions: No follow-ups on file.   Ortho Exam  Patient is alert, oriented, no adenopathy, well-dressed, normal affect, normal respiratory effort. Focused examination right below-knee amputation has virtually no swelling.  Incision with well approximated wound edges without any sign of necrosis.  Staples are intact and in place.  He has full range of motion of his knee.  Imaging: No results found. No images are attached to the encounter.  Labs: Lab Results  Component Value Date   HGBA1C 5.1 06/26/2016   ESRSEDRATE 9 12/08/2018   REPTSTATUS 10/04/2019 FINAL 09/29/2019   GRAMSTAIN  09/29/2019    ABUNDANT WBC PRESENT, PREDOMINANTLY PMN FEW GRAM POSITIVE COCCI    CULT  09/29/2019    FEW STAPHYLOCOCCUS AUREUS NO ANAEROBES ISOLATED Performed at [facility], [place] [address], [place], [place] [address]    LABORGA STAPHYLOCOCCUS AUREUS 09/29/2019     Lab Results  Component Value Date   ALBUMIN 4.4 07/02/2019   ALBUMIN 4.3 08/12/2015   ALBUMIN 4.1 05/06/2015    No results found  for: MG No results found for: VD25OH  No results found for: PREALBUMIN CBC EXTENDED Latest Ref Rng & Units 01/29/2020 09/29/2019 09/09/2019  WBC 4.0 - 10.5 K/uL 6.7 6.8 6.0  RBC 4.22 - 5.81 MIL/uL 5.34 4.67 5.72  HGB 13.0 - 17.0 g/dL 15.2 13.1 16.4  HCT 39.0 - 52.0 % 48.1 42.0 51.1  PLT 150 - 400 K/uL 221 305 232  NEUTROABS 1.7 - 7.7 K/uL - - -  LYMPHSABS 0.7 - 4.0 K/uL - - -     Body mass index is 18.99 kg/m.  Orders:  No orders of the defined types were placed in this encounter.  No orders of the defined types were placed in this encounter.    Procedures: No procedures performed  Clinical Data: No additional findings.  ROS:  All other systems negative, except as noted in the HPI. Review of Systems  Objective: Vital Signs: Ht 6' (1.829 m)   Wt 140 lb (63.5 kg)   BMI 18.99 kg/m   Specialty Comments:  No specialty comments available.  PMFS History: Patient Active Problem List   Diagnosis Date Noted  . Acute hematogenous osteomyelitis of right foot ([place]) 01/29/2020  . Post-traumatic arthritis of ankle, right   . Cutaneous abscess of right  ankle   . Subacute osteomyelitis of right tibia ([place])   . Fatty liver 06/30/2019  . Calculus of gallbladder without cholecystitis without obstruction 06/30/2019  . Nausea without vomiting 05/13/2019  . Abdominal bloating 05/13/2019  . Wound infection following procedure 12/19/2018  . Malunion of joint fusion ([place]) 11/26/2018  . 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[facility])   . Status post lumbar spinal fusion 09/10/2018  . S/P ankle fusion 11/27/2017  . Nonunion of subtalar arthrodesis   . Avascular necrosis of talus, right ([place])   . Post-traumatic osteoarthritis, left ankle and foot 10/22/2017  . DDD (degenerative disc disease), lumbar 04/05/2017  . Fracture of L2 vertebra ([place]) 01/12/2015  . Acute osteomyelitis of calcaneum ([place]) 10/09/2014  . Dysuria 10/05/2014  . Cellulitis 10/05/2014  . Fall from ladder 09/21/2014  . L2 vertebral  fracture ([place]) 09/21/2014  . Bilateral calcaneal fractures 09/21/2014  . Chronic pain 09/21/2014  . Acute blood loss anemia 09/21/2014  . Open fracture of both calcanei 09/15/2014   Past Medical History:  Diagnosis Date  . Anxiety   . Arthritis    low back pain, lumbar radiculopathy  . Depression   . Fatty liver   . Fracture    B/L ankles  . GERD (gastroesophageal reflux disease)   . Headache(784.0)    allergy related   . History of kidney stones    passed stones  . History of stomach ulcers   . Retained orthopedic hardware    failed retained hardware right foot  . Wears glasses   . Wears glasses   . Wears partial dentures    upper    Family History  Problem Relation Age of Onset  . Diabetes Father   . Cancer Other     Past Surgical History:  Procedure Laterality Date  . AMPUTATION Right 01/29/2020   Procedure: RIGHT BELOW KNEE AMPUTATION;  Surgeon: Newt Minion, MD;  Location: [place];  Service: Orthopedics;  Laterality: Right;  . ANKLE FUSION Right 05/11/2015   Procedure: Right Posterior Arthroscopic Subtalar Arthrodesis;  Surgeon: Newt Minion, MD;  Location: [place];  Service: Orthopedics;  Laterality: Right;  . ANKLE FUSION Right 11/27/2017   Procedure: RIGHT TIBIOCALCANEAL FUSION;  Surgeon: Newt Minion, MD;  Location: [place];  Service: Orthopedics;  Laterality: Right;  . ANKLE FUSION Right 11/26/2018   Procedure: RIGHT ANTERIOR ANKLE FUSION, APPLY VAC;  Surgeon: Newt Minion, MD;  Location: [place];  Service: Orthopedics;  Laterality: Right;  . ANKLE FUSION Right 09/09/2019   Procedure: REVISION RIGHT ANKLE FUSION;  Surgeon: Newt Minion, MD;  Location: [place];  Service: Orthopedics;  Laterality: Right;  . ANTERIOR LAT LUMBAR FUSION N/A 04/05/2017   Procedure: Extreme Lateral Interbody Fusion - Lumbar three-lumbar four ,exploration of fusion Posterior augmentation with globus addition Removal hardware Lumbar one-three. Lumbar four-sacral one,  Removal internal bone  growth stimulator;  Surgeon: Kary Kos, MD;  Location: [place];  Service: Neurosurgery;  Laterality: N/A;  . APPLICATION OF WOUND VAC  12/23/2018   Procedure: Application Of Wound Vac;  Surgeon: Newt Minion, MD;  Location: [place];  Service: Orthopedics;;  . BACK SURGERY  2004   x 2  . BIOPSY  06/05/2019   Procedure: BIOPSY;  Surgeon: Rogene Houston, MD;  [facility];  Service: Endoscopy;;  gastric biopsy  . CERVICAL SPINE SURGERY  2008  . CHOLECYSTECTOMY N/A 07/06/2019   Procedure: LAPAROSCOPIC CHOLECYSTECTOMY;  Surgeon: Virl Cagey, MD;  [facility];  Service: General;  Laterality: N/A;  . COLONOSCOPY WITH PROPOFOL N/A 06/05/2019   Procedure: COLONOSCOPY WITH PROPOFOL;  Surgeon: Rogene Houston, MD;  [facility];  Service: Endoscopy;  Laterality: N/A;  . ESOPHAGOGASTRODUODENOSCOPY    . ESOPHAGOGASTRODUODENOSCOPY (EGD) WITH PROPOFOL N/A 06/05/2019   Procedure: ESOPHAGOGASTRODUODENOSCOPY (EGD) WITH PROPOFOL;  Surgeon: Rogene Houston, MD;  [facility];  Service: Endoscopy;  Laterality: N/A;  1220pm  . HARDWARE REMOVAL Right 10/09/2014   Procedure: Removal Deep Hardware, Irrigation and Debridement Calcaneus, Place Antibiotic Beads and Wound VAC ;  Surgeon: Newt Minion, MD;  Location: [place];  Service: Orthopedics;  Laterality: Right;  . HARDWARE REMOVAL Right 08/12/2015   Procedure: Removal Deep Hardware Right Foot;  Surgeon: Newt Minion, MD;  Location: [place];  Service: Orthopedics;  Laterality: Right;  . HARDWARE REMOVAL Right 11/26/2018   Procedure: REMOVAL RIGHT TIBIOCALCANEAL NAIL;  Surgeon: Newt Minion, MD;  Location: [place];  Service: Orthopedics;  Laterality: Right;  . HARDWARE REMOVAL Right 12/23/2018   Procedure: RIGHT ANKLE REMOVE HARDWARE, PLACE ANTIBIOTIC BEADS and placement of wound vac;  Surgeon: Newt Minion, MD;  Location: [place];  Service: Orthopedics;  Laterality: Right;  . HARDWARE REMOVAL Right 09/29/2019   Procedure: RIGHT  ANKLE REMOVE HARDWARE, DEBRIDEMENT;  Surgeon: Newt Minion, MD;  Location: [place];  Service: Orthopedics;  Laterality: Right;  . I & D EXTREMITY Right 09/15/2014   Procedure: IRRIGATION AND DEBRIDEMENT Ankle;  Surgeon: Renette Butters, MD;  Location: [place];  Service: Orthopedics;  Laterality: Right;  . INGUINAL HERNIA REPAIR Bilateral   . LAMINECTOMY WITH POSTERIOR LATERAL ARTHRODESIS LEVEL 4 N/A 09/10/2018   Procedure: Posterior Lateral Fusion - Thoracic Eleven-Thoracic Twelve - Thoracic Twelve-Lumbar One - Lumbar One-Lumbar Two - Lumbar Two-Lumbar Three with instrumentaion and PLA;  Surgeon: Kary Kos, MD;  Location: [place];  Service: Neurosurgery;  Laterality: N/A;  Posterior Lateral Fusion - Thoracic Eleven-Thoracic Twelve - Thoracic Twelve-Lumbar One - Lumbar One-Lumbar Two - Lumbar Two-Lumbar Thre  . LIVER BIOPSY N/A 07/06/2019   Procedure: LIVER BIOPSY;  Surgeon: Virl Cagey, MD;  [facility];  Service: General;  Laterality: N/A;  . LUMBAR PERCUTANEOUS PEDICLE SCREW 1 LEVEL N/A 04/05/2017   Procedure: LUMBAR PERCUTANEOUS PEDICLE SCREW LUMBAR THREE-FOUR;  Surgeon: Kary Kos, MD;  Location: [place];  Service: Neurosurgery;  Laterality: N/A;  . ORIF CALCANEOUS FRACTURE Right 09/19/2014   Procedure: OPEN REDUCTION INTERNAL FIXATION (ORIF) CALCANEOUS FRACTURE;  Surgeon: Newt Minion, MD;  Location: [place];  Service: Orthopedics;  Laterality: Right;  . ORIF CALCANEOUS FRACTURE Left 09/19/2014   Procedure: OPEN REDUCTION INTERNAL FIXATION (ORIF) CALCANEOUS FRACTURE;  Surgeon: Newt Minion, MD;  Location: [place];  Service: Orthopedics;  Laterality: Left;  . POLYPECTOMY  06/05/2019   Procedure: POLYPECTOMY;  Surgeon: Rogene Houston, MD;  [facility];  Service: Endoscopy;;  cecal polyp biopsy forcep, ascending polyp cold snare   Social History   Occupational History    Comment: disabled  Tobacco Use  . Smoking status: Former Smoker    Packs/day: 1.00    Years: 10.00     Pack years: 10.00    Types: Cigars    Start date: 10/08/1976    Quit date: 09/2017    Years since quitting: 2.4  . Smokeless tobacco: Never Used  . Tobacco comment: quit smoking cegar 09/10/2018  Substance and Sexual Activity  . Alcohol use: No    Comment:  quit 1998  . Drug use: No  . Sexual activity: Yes    Birth control/protection: None

## 2020-02-19 ENCOUNTER — Ambulatory Visit (INDEPENDENT_AMBULATORY_CARE_PROVIDER_SITE_OTHER): Payer: PPO | Admitting: Physician Assistant

## 2020-02-19 ENCOUNTER — Ambulatory Visit (INDEPENDENT_AMBULATORY_CARE_PROVIDER_SITE_OTHER): Payer: PPO

## 2020-02-19 ENCOUNTER — Encounter: Payer: Self-pay | Admitting: Physician Assistant

## 2020-02-19 ENCOUNTER — Other Ambulatory Visit: Payer: Self-pay

## 2020-02-19 DIAGNOSIS — M25562 Pain in left knee: Secondary | ICD-10-CM

## 2020-02-19 MED ORDER — METHYLPREDNISOLONE ACETATE 40 MG/ML IJ SUSP
40.0000 mg | INTRAMUSCULAR | Status: AC | PRN
  Administered 2020-02-19: 40 mg via INTRA_ARTICULAR

## 2020-02-19 MED ORDER — LIDOCAINE HCL 1 % IJ SOLN
1.0000 mL | INTRAMUSCULAR | Status: AC | PRN
  Administered 2020-02-19: 1 mL

## 2020-02-19 NOTE — Progress Notes (Signed)
Office Visit Note   Patient: Bradley Espinoza           Date of Birth: 07/28/1956           MRN: [id number] Visit Date: 02/19/2020              Requested by: Celene Squibb, MD [place],  [place] [address] PCP: Celene Squibb, MD  Chief Complaint  Patient presents with  . Right Knee - Routine Post Op      HPI: This is a pleasant 64 year old gentleman who is now 3 weeks status post right below-knee amputation he has been doing well and is making an appointment to have his prosthetic molded.  His biggest complaint is of left knee pain.  He states he has had knee issues in the past and because he is relying on his left leg more he is beginning to have a lot more pain in the left knee  Assessment & Plan: Visit Diagnoses:  1. Left knee pain, unspecified chronicity     Plan: I have given him a prescription for a K3 socket to be made.  Surgical staples will be removed today.  I also discussed with him going forward with a steroid injection into the left knee today and he would like to do this.  He may be a good candidate for viscosupplementation in the future Patient is a new right transtibial  amputee.  Patient's current comorbidities are not expected to impact the ability to function with the prescribed prosthesis. Patient verbally communicates a strong desire to use a prosthesis. Patient currently requires mobility aids to ambulate without a prosthesis.  Expects not to use mobility aids with a new prosthesis.  Patient is a K3 level ambulator that spends a lot of time walking around on uneven terrain over obstacles, up and down stairs, and ambulates with a variable cadence.     Follow-Up Instructions: No follow-ups on file.   Ortho Exam  Patient is alert, oriented, no adenopathy, well-dressed, normal affect, normal respiratory effort. Right below-knee amputation stump has completely healed incision with well opposed wound edges no drainage swelling is well  controlled full knee range of motion Left knee no effusion tender around the medial lateral joint line varus alignment no cellulitis  Imaging: No results found. No images are attached to the encounter.  Labs: Lab Results  Component Value Date   HGBA1C 5.1 06/26/2016   ESRSEDRATE 9 12/08/2018   REPTSTATUS 10/04/2019 FINAL 09/29/2019   GRAMSTAIN  09/29/2019    ABUNDANT WBC PRESENT, PREDOMINANTLY PMN FEW GRAM POSITIVE COCCI    CULT  09/29/2019    FEW STAPHYLOCOCCUS AUREUS NO ANAEROBES ISOLATED Performed at [facility], [place] [address] [place],  [address]    LABORGA STAPHYLOCOCCUS AUREUS 09/29/2019     Lab Results  Component Value Date   ALBUMIN 4.4 07/02/2019   ALBUMIN 4.3 08/12/2015   ALBUMIN 4.1 05/06/2015    No results found for: MG No results found for: VD25OH  No results found for: PREALBUMIN CBC EXTENDED Latest Ref Rng & Units 01/29/2020 09/29/2019 09/09/2019  WBC 4.0 - 10.5 K/uL 6.7 6.8 6.0  RBC 4.22 - 5.81 MIL/uL 5.34 4.67 5.72  HGB 13.0 - 17.0 g/dL 15.2 13.1 16.4  HCT 39.0 - 52.0 % 48.1 42.0 51.1  PLT 150 - 400 K/uL 221 305 232  NEUTROABS 1.7 - 7.7 K/uL - - -  LYMPHSABS 0.7 - 4.0 K/uL - - -  There is no height or weight on file to calculate BMI.  Orders:  Orders Placed This Encounter  Procedures  . XR Knee 1-2 Views Left   No orders of the defined types were placed in this encounter.    Procedures: Large Joint Inj: L knee on 02/19/2020 1:31 PM Indications: pain and diagnostic evaluation Details: 22 G 1.5 in needle, anterolateral approach  Arthrogram: No  Medications: 40 mg methylPREDNISolone acetate 40 MG/ML; 1 mL lidocaine 1 % Outcome: tolerated well, no immediate complications Procedure, treatment alternatives, risks and benefits explained, specific risks discussed. Consent was given by the patient.      Clinical Data: No additional findings.  ROS:  All other systems negative, except as noted in the HPI. Review of  Systems  Objective: Vital Signs: There were no vitals taken for this visit.  Specialty Comments:  No specialty comments available.  PMFS History: Patient Active Problem List   Diagnosis Date Noted  . Acute hematogenous osteomyelitis of right foot ([place]) 01/29/2020  . Post-traumatic arthritis of ankle, right   . Cutaneous abscess of right ankle   . Subacute osteomyelitis of right tibia ([place])   . Fatty liver 06/30/2019  . Calculus of gallbladder without cholecystitis without obstruction 06/30/2019  . Nausea without vomiting 05/13/2019  . Abdominal bloating 05/13/2019  . Wound infection following procedure 12/19/2018  . Malunion of joint fusion ([place]) 11/26/2018  . 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[facility])   . Status post lumbar spinal fusion 09/10/2018  . S/P ankle fusion 11/27/2017  . Nonunion of subtalar arthrodesis   . Avascular necrosis of talus, right ([place])   . Post-traumatic osteoarthritis, left ankle and foot 10/22/2017  . DDD (degenerative disc disease), lumbar 04/05/2017  . Fracture of L2 vertebra ([place]) 01/12/2015  . Acute osteomyelitis of calcaneum ([place]) 10/09/2014  . Dysuria 10/05/2014  . Cellulitis 10/05/2014  . Fall from ladder 09/21/2014  . L2 vertebral fracture ([place]) 09/21/2014  . Bilateral calcaneal fractures 09/21/2014  . Chronic pain 09/21/2014  . Acute blood loss anemia 09/21/2014  . Open fracture of both calcanei 09/15/2014   Past Medical History:  Diagnosis Date  . Anxiety   . Arthritis    low back pain, lumbar radiculopathy  . Depression   . Fatty liver   . Fracture    B/L ankles  . GERD (gastroesophageal reflux disease)   . Headache(784.0)    allergy related   . History of kidney stones    passed stones  . History of stomach ulcers   . Retained orthopedic hardware    failed retained hardware right foot  . Wears glasses   . Wears glasses   . Wears partial dentures    upper    Family History  Problem Relation Age of Onset  . Diabetes Father   . Cancer  Other     Past Surgical History:  Procedure Laterality Date  . AMPUTATION Right 01/29/2020   Procedure: RIGHT BELOW KNEE AMPUTATION;  Surgeon: Newt Minion, MD;  Location: [facility];  Service: Orthopedics;  Laterality: Right;  . ANKLE FUSION Right 05/11/2015   Procedure: Right Posterior Arthroscopic Subtalar Arthrodesis;  Surgeon: Newt Minion, MD;  Location: [facility];  Service: Orthopedics;  Laterality: Right;  . ANKLE FUSION Right 11/27/2017   Procedure: RIGHT TIBIOCALCANEAL FUSION;  Surgeon: Newt Minion, MD;  Location: [facility];  Service: Orthopedics;  Laterality: Right;  . ANKLE FUSION Right 11/26/2018   Procedure: RIGHT ANTERIOR ANKLE FUSION, APPLY VAC;  Surgeon: Newt Minion, MD;  Location: [facility];  Service: Orthopedics;  Laterality: Right;  . ANKLE FUSION Right 09/09/2019   Procedure: REVISION RIGHT ANKLE FUSION;  Surgeon: Newt Minion, MD;  Location: [facility];  Service: Orthopedics;  Laterality: Right;  . ANTERIOR LAT LUMBAR FUSION N/A 04/05/2017   Procedure: Extreme Lateral Interbody Fusion - Lumbar three-lumbar four ,exploration of fusion Posterior augmentation with globus addition Removal hardware Lumbar one-three. Lumbar four-sacral one,  Removal internal bone growth stimulator;  Surgeon: Kary Kos, MD;  Location: [place] [facility];  Service: Neurosurgery;  Laterality: N/A;  . APPLICATION OF WOUND VAC  12/23/2018   Procedure: Application Of Wound Vac;  Surgeon: Newt Minion, MD;  Location: [facility];  Service: Orthopedics;;  . BACK SURGERY  2004   x 2  . BIOPSY  06/05/2019   Procedure: BIOPSY;  Surgeon: Rogene Houston, MD;  [facility];  Service: Endoscopy;;  gastric biopsy  . CERVICAL SPINE SURGERY  2008  . CHOLECYSTECTOMY N/A 07/06/2019   Procedure: LAPAROSCOPIC CHOLECYSTECTOMY;  Surgeon: Virl Cagey, MD;  [facility];  Service: General;  Laterality: N/A;  . COLONOSCOPY WITH PROPOFOL N/A 06/05/2019   Procedure: COLONOSCOPY WITH PROPOFOL;  Surgeon: Rogene Houston, MD;   [facility];  Service: Endoscopy;  Laterality: N/A;  . ESOPHAGOGASTRODUODENOSCOPY    . ESOPHAGOGASTRODUODENOSCOPY (EGD) WITH PROPOFOL N/A 06/05/2019   Procedure: ESOPHAGOGASTRODUODENOSCOPY (EGD) WITH PROPOFOL;  Surgeon: Rogene Houston, MD;  [facility];  Service: Endoscopy;  Laterality: N/A;  1220pm  . HARDWARE REMOVAL Right 10/09/2014   Procedure: Removal Deep Hardware, Irrigation and Debridement Calcaneus, Place Antibiotic Beads and Wound VAC ;  Surgeon: Newt Minion, MD;  Location: [place];  Service: Orthopedics;  Laterality: Right;  . HARDWARE REMOVAL Right 08/12/2015   Procedure: Removal Deep Hardware Right Foot;  Surgeon: Newt Minion, MD;  Location: [place];  Service: Orthopedics;  Laterality: Right;  . HARDWARE REMOVAL Right 11/26/2018   Procedure: REMOVAL RIGHT TIBIOCALCANEAL NAIL;  Surgeon: Newt Minion, MD;  Location: [facility];  Service: Orthopedics;  Laterality: Right;  . HARDWARE REMOVAL Right 12/23/2018   Procedure: RIGHT ANKLE REMOVE HARDWARE, PLACE ANTIBIOTIC BEADS and placement of wound vac;  Surgeon: Newt Minion, MD;  Location: [facility];  Service: Orthopedics;  Laterality: Right;  . HARDWARE REMOVAL Right 09/29/2019   Procedure: RIGHT ANKLE REMOVE HARDWARE, DEBRIDEMENT;  Surgeon: Newt Minion, MD;  Location: [place];  Service: Orthopedics;  Laterality: Right;  . I & D EXTREMITY Right 09/15/2014   Procedure: IRRIGATION AND DEBRIDEMENT Ankle;  Surgeon: Renette Butters, MD;  Location: [place];  Service: Orthopedics;  Laterality: Right;  . INGUINAL HERNIA REPAIR Bilateral   . LAMINECTOMY WITH POSTERIOR LATERAL ARTHRODESIS LEVEL 4 N/A 09/10/2018   Procedure: Posterior Lateral Fusion - Thoracic Eleven-Thoracic Twelve - Thoracic Twelve-Lumbar One - Lumbar One-Lumbar Two - Lumbar Two-Lumbar Three with instrumentaion and PLA;  Surgeon: Kary Kos, MD;  Location: [place];  Service: Neurosurgery;  Laterality: N/A;  Posterior Lateral Fusion - Thoracic Eleven-Thoracic Twelve -  Thoracic Twelve-Lumbar One - Lumbar One-Lumbar Two - Lumbar Two-Lumbar Thre  . LIVER BIOPSY N/A 07/06/2019   Procedure: LIVER BIOPSY;  Surgeon: Virl Cagey, MD;  [facility];  Service: General;  Laterality: N/A;  . LUMBAR PERCUTANEOUS PEDICLE SCREW 1 LEVEL N/A 04/05/2017   Procedure: LUMBAR PERCUTANEOUS PEDICLE SCREW LUMBAR THREE-FOUR;  Surgeon: Kary Kos, MD;  Location: [place];  Service: Neurosurgery;  Laterality: N/A;  . ORIF  CALCANEOUS FRACTURE Right 09/19/2014   Procedure: OPEN REDUCTION INTERNAL FIXATION (ORIF) CALCANEOUS FRACTURE;  Surgeon: Newt Minion, MD;  Location: [place];  Service: Orthopedics;  Laterality: Right;  . ORIF CALCANEOUS FRACTURE Left 09/19/2014   Procedure: OPEN REDUCTION INTERNAL FIXATION (ORIF) CALCANEOUS FRACTURE;  Surgeon: Newt Minion, MD;  Location: [facility];  Service: Orthopedics;  Laterality: Left;  . POLYPECTOMY  06/05/2019   Procedure: POLYPECTOMY;  Surgeon: Rogene Houston, MD;  [facility];  Service: Endoscopy;;  cecal polyp biopsy forcep, ascending polyp cold snare   Social History   Occupational History    Comment: disabled  Tobacco Use  . Smoking status: Former Smoker    Packs/day: 1.00    Years: 10.00    Pack years: 10.00    Types: Cigars    Start date: 10/08/1976    Quit date: 09/2017    Years since quitting: 2.4  . Smokeless tobacco: Never Used  . Tobacco comment: quit smoking cegar 09/10/2018  Substance and Sexual Activity  . Alcohol use: No    Comment:  quit 1998  . Drug use: No  . Sexual activity: Yes    Birth control/protection: None

## 2020-02-24 ENCOUNTER — Other Ambulatory Visit (INDEPENDENT_AMBULATORY_CARE_PROVIDER_SITE_OTHER): Payer: Self-pay | Admitting: Internal Medicine

## 2020-03-09 DIAGNOSIS — R5383 Other fatigue: Secondary | ICD-10-CM | POA: Diagnosis not present

## 2020-03-09 DIAGNOSIS — R1011 Right upper quadrant pain: Secondary | ICD-10-CM | POA: Diagnosis not present

## 2020-03-09 DIAGNOSIS — M5136 Other intervertebral disc degeneration, lumbar region: Secondary | ICD-10-CM | POA: Diagnosis not present

## 2020-03-09 DIAGNOSIS — J039 Acute tonsillitis, unspecified: Secondary | ICD-10-CM | POA: Diagnosis not present

## 2020-03-09 DIAGNOSIS — F5221 Male erectile disorder: Secondary | ICD-10-CM | POA: Diagnosis not present

## 2020-03-09 DIAGNOSIS — R140 Abdominal distension (gaseous): Secondary | ICD-10-CM | POA: Diagnosis not present

## 2020-03-09 DIAGNOSIS — J029 Acute pharyngitis, unspecified: Secondary | ICD-10-CM | POA: Diagnosis not present

## 2020-03-09 DIAGNOSIS — L0291 Cutaneous abscess, unspecified: Secondary | ICD-10-CM | POA: Diagnosis not present

## 2020-03-09 DIAGNOSIS — N400 Enlarged prostate without lower urinary tract symptoms: Secondary | ICD-10-CM | POA: Diagnosis not present

## 2020-03-09 DIAGNOSIS — E291 Testicular hypofunction: Secondary | ICD-10-CM | POA: Diagnosis not present

## 2020-03-09 DIAGNOSIS — S6991XA Unspecified injury of right wrist, hand and finger(s), initial encounter: Secondary | ICD-10-CM | POA: Diagnosis not present

## 2020-03-09 DIAGNOSIS — E782 Mixed hyperlipidemia: Secondary | ICD-10-CM | POA: Diagnosis not present

## 2020-03-16 DIAGNOSIS — G894 Chronic pain syndrome: Secondary | ICD-10-CM | POA: Diagnosis not present

## 2020-03-16 DIAGNOSIS — K219 Gastro-esophageal reflux disease without esophagitis: Secondary | ICD-10-CM | POA: Diagnosis not present

## 2020-03-16 DIAGNOSIS — E291 Testicular hypofunction: Secondary | ICD-10-CM | POA: Diagnosis not present

## 2020-03-16 DIAGNOSIS — R5383 Other fatigue: Secondary | ICD-10-CM | POA: Diagnosis not present

## 2020-03-16 DIAGNOSIS — N400 Enlarged prostate without lower urinary tract symptoms: Secondary | ICD-10-CM | POA: Diagnosis not present

## 2020-03-16 DIAGNOSIS — K5900 Constipation, unspecified: Secondary | ICD-10-CM | POA: Diagnosis not present

## 2020-03-16 DIAGNOSIS — Z89511 Acquired absence of right leg below knee: Secondary | ICD-10-CM | POA: Diagnosis not present

## 2020-03-16 DIAGNOSIS — G4700 Insomnia, unspecified: Secondary | ICD-10-CM | POA: Diagnosis not present

## 2020-03-16 DIAGNOSIS — F5221 Male erectile disorder: Secondary | ICD-10-CM | POA: Diagnosis not present

## 2020-03-16 DIAGNOSIS — E785 Hyperlipidemia, unspecified: Secondary | ICD-10-CM | POA: Diagnosis not present

## 2020-03-18 ENCOUNTER — Encounter: Payer: Self-pay | Admitting: Physician Assistant

## 2020-03-18 ENCOUNTER — Ambulatory Visit (INDEPENDENT_AMBULATORY_CARE_PROVIDER_SITE_OTHER): Payer: PPO | Admitting: Physician Assistant

## 2020-03-18 ENCOUNTER — Other Ambulatory Visit: Payer: Self-pay

## 2020-03-18 VITALS — Ht 72.0 in | Wt 140.0 lb

## 2020-03-18 DIAGNOSIS — M19171 Post-traumatic osteoarthritis, right ankle and foot: Secondary | ICD-10-CM

## 2020-03-18 NOTE — Progress Notes (Signed)
Office Visit Note   Patient: Bradley Espinoza           Date of Birth: 04/05/1956           MRN: [id number] Visit Date: 03/18/2020              Requested by: Celene Squibb, MD [employer],  [place] [address] PCP: Celene Squibb, MD  Chief Complaint  Patient presents with  . Left Knee - Follow-up    02/19/20 s/p injection   . Right Leg - Routine Post Op    01/29/20 BKA       HPI: This is a pleasant gentleman who is approximately 7 weeks status post right below-knee amputation.  He also is status post cortisone injection into his left knee.  He has a history of left knee valgus arthritis.  The injection did give him good relief initially and he did get some relief from the steroid but not as much as he hoped.  He is in the process of getting his prosthetic made he is also asking about an injection into the left subtalar joint which was done in the past by Dr. Sharol Given Assessment & Plan: Visit Diagnoses: No diagnosis found.  Plan: We will go forward and get authorization for viscosupplementation for his left knee.  We also talked at that visit when he comes in he could possibly get a subtalar injection.  He does not want to do formal physical therapy yet but will contact us if he does.  I think he would greatly benefit from this Follow-Up Instructions: No follow-ups on file.   Ortho Exam  Patient is alert, oriented, no adenopathy, well-dressed, normal affect, normal respiratory effort. Right below-knee amputation stump is completely healed with minimal to no swelling.  Full knee range of motion.  No cellulitis  Left knee no effusion no cellulitis he does have some tenderness especially over the joint line.  He points to tenderness in his ankle over the subtalar joint which is especially evident with palpation in the sinus tarsi laterally no swelling or effusion  Imaging: No results found. No images are attached to the encounter.  Labs: Lab Results  Component Value Date     HGBA1C 5.1 06/26/2016   ESRSEDRATE 9 12/08/2018   REPTSTATUS 10/04/2019 FINAL 09/29/2019   GRAMSTAIN  09/29/2019    ABUNDANT WBC PRESENT, PREDOMINANTLY PMN FEW GRAM POSITIVE COCCI    CULT  09/29/2019    FEW STAPHYLOCOCCUS AUREUS NO ANAEROBES ISOLATED Performed at [facility], [place] [address], [place], [place] [address]    LABORGA STAPHYLOCOCCUS AUREUS 09/29/2019     Lab Results  Component Value Date   ALBUMIN 4.4 07/02/2019   ALBUMIN 4.3 08/12/2015   ALBUMIN 4.1 05/06/2015    No results found for: MG No results found for: VD25OH  No results found for: PREALBUMIN CBC EXTENDED Latest Ref Rng & Units 01/29/2020 09/29/2019 09/09/2019  WBC 4.0 - 10.5 K/uL 6.7 6.8 6.0  RBC 4.22 - 5.81 MIL/uL 5.34 4.67 5.72  HGB 13.0 - 17.0 g/dL 15.2 13.1 16.4  HCT 39 - 52 % 48.1 42.0 51.1  PLT 150 - 400 K/uL 221 305 232  NEUTROABS 1.7 - 7.7 K/uL - - -  LYMPHSABS 0.7 - 4.0 K/uL - - -     Body mass index is 18.99 kg/m.  Orders:  No orders of the defined types were placed in this encounter.  No orders of the defined types  were placed in this encounter.    Procedures: No procedures performed  Clinical Data: No additional findings.  ROS:  All other systems negative, except as noted in the HPI. Review of Systems  Objective: Vital Signs: Ht 6' (1.829 m)   Wt 140 lb (63.5 kg)   BMI 18.99 kg/m   Specialty Comments:  No specialty comments available.  PMFS History: Patient Active Problem List   Diagnosis Date Noted  . Acute hematogenous osteomyelitis of right foot ([place]) 01/29/2020  . Post-traumatic arthritis of ankle, right   . Cutaneous abscess of right ankle   . Subacute osteomyelitis of right tibia ([place])   . Fatty liver 06/30/2019  . Calculus of gallbladder without cholecystitis without obstruction 06/30/2019  . Nausea without vomiting 05/13/2019  . Abdominal bloating 05/13/2019  . Wound infection following procedure 12/19/2018  . Malunion of joint fusion ([place])  11/26/2018  . 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[facility])   . Status post lumbar spinal fusion 09/10/2018  . S/P ankle fusion 11/27/2017  . Nonunion of subtalar arthrodesis   . Avascular necrosis of talus, right ([place])   . Post-traumatic osteoarthritis, left ankle and foot 10/22/2017  . DDD (degenerative disc disease), lumbar 04/05/2017  . Fracture of L2 vertebra ([place]) 01/12/2015  . Acute osteomyelitis of calcaneum ([place]) 10/09/2014  . Dysuria 10/05/2014  . Cellulitis 10/05/2014  . Fall from ladder 09/21/2014  . L2 vertebral fracture ([place]) 09/21/2014  . Bilateral calcaneal fractures 09/21/2014  . Chronic pain 09/21/2014  . Acute blood loss anemia 09/21/2014  . Open fracture of both calcanei 09/15/2014   Past Medical History:  Diagnosis Date  . Anxiety   . Arthritis    low back pain, lumbar radiculopathy  . Depression   . Fatty liver   . Fracture    B/L ankles  . GERD (gastroesophageal reflux disease)   . Headache(784.0)    allergy related   . History of kidney stones    passed stones  . History of stomach ulcers   . Retained orthopedic hardware    failed retained hardware right foot  . Wears glasses   . Wears glasses   . Wears partial dentures    upper    Family History  Problem Relation Age of Onset  . Diabetes Father   . Cancer Other     Past Surgical History:  Procedure Laterality Date  . AMPUTATION Right 01/29/2020   Procedure: RIGHT BELOW KNEE AMPUTATION;  Surgeon: Newt Minion, MD;  Location: [facility];  Service: Orthopedics;  Laterality: Right;  . ANKLE FUSION Right 05/11/2015   Procedure: Right Posterior Arthroscopic Subtalar Arthrodesis;  Surgeon: Newt Minion, MD;  Location: [facility];  Service: Orthopedics;  Laterality: Right;  . ANKLE FUSION Right 11/27/2017   Procedure: RIGHT TIBIOCALCANEAL FUSION;  Surgeon: Newt Minion, MD;  Location: [place];  Service: Orthopedics;  Laterality: Right;  . ANKLE FUSION Right 11/26/2018   Procedure: RIGHT ANTERIOR ANKLE FUSION, APPLY VAC;   Surgeon: Newt Minion, MD;  Location: [facility];  Service: Orthopedics;  Laterality: Right;  . ANKLE FUSION Right 09/09/2019   Procedure: REVISION RIGHT ANKLE FUSION;  Surgeon: Newt Minion, MD;  Location: [facility];  Service: Orthopedics;  Laterality: Right;  . ANTERIOR LAT LUMBAR FUSION N/A 04/05/2017   Procedure: Extreme Lateral Interbody Fusion - Lumbar three-lumbar four ,exploration of fusion Posterior augmentation with globus addition Removal hardware Lumbar one-three. Lumbar four-sacral one,  Removal internal bone growth stimulator;  Surgeon: Kary Kos, MD;  [facility];  Service: Neurosurgery;  Laterality: N/A;  . APPLICATION OF WOUND VAC  12/23/2018   Procedure: Application Of Wound Vac;  Surgeon: Newt Minion, MD;  Location: [facility];  Service: Orthopedics;;  . BACK SURGERY  2004   x 2  . BIOPSY  06/05/2019   Procedure: BIOPSY;  Surgeon: Rogene Houston, MD;  [facility];  Service: Endoscopy;;  gastric biopsy  . CERVICAL SPINE SURGERY  2008  . CHOLECYSTECTOMY N/A 07/06/2019   Procedure: LAPAROSCOPIC CHOLECYSTECTOMY;  Surgeon: Virl Cagey, MD;  [facility];  Service: General;  Laterality: N/A;  . COLONOSCOPY WITH PROPOFOL N/A 06/05/2019   Procedure: COLONOSCOPY WITH PROPOFOL;  Surgeon: Rogene Houston, MD;  [facility];  Service: Endoscopy;  Laterality: N/A;  . ESOPHAGOGASTRODUODENOSCOPY    . ESOPHAGOGASTRODUODENOSCOPY (EGD) WITH PROPOFOL N/A 06/05/2019   Procedure: ESOPHAGOGASTRODUODENOSCOPY (EGD) WITH PROPOFOL;  Surgeon: Rogene Houston, MD;  [facility];  Service: Endoscopy;  Laterality: N/A;  1220pm  . HARDWARE REMOVAL Right 10/09/2014   Procedure: Removal Deep Hardware, Irrigation and Debridement Calcaneus, Place Antibiotic Beads and Wound VAC ;  Surgeon: Newt Minion, MD;  Location: [facility];  Service: Orthopedics;  Laterality: Right;  . HARDWARE REMOVAL Right 08/12/2015   Procedure: Removal Deep Hardware Right Foot;  Surgeon: Newt Minion,  MD;  Location: [facility];  Service: Orthopedics;  Laterality: Right;  . HARDWARE REMOVAL Right 11/26/2018   Procedure: REMOVAL RIGHT TIBIOCALCANEAL NAIL;  Surgeon: Newt Minion, MD;  Location: [facility];  Service: Orthopedics;  Laterality: Right;  . HARDWARE REMOVAL Right 12/23/2018   Procedure: RIGHT ANKLE REMOVE HARDWARE, PLACE ANTIBIOTIC BEADS and placement of wound vac;  Surgeon: Newt Minion, MD;  Location: [facility];  Service: Orthopedics;  Laterality: Right;  . HARDWARE REMOVAL Right 09/29/2019   Procedure: RIGHT ANKLE REMOVE HARDWARE, DEBRIDEMENT;  Surgeon: Newt Minion, MD;  Location: [facility];  Service: Orthopedics;  Laterality: Right;  . I & D EXTREMITY Right 09/15/2014   Procedure: IRRIGATION AND DEBRIDEMENT Ankle;  Surgeon: Renette Butters, MD;  Location: [facility];  Service: Orthopedics;  Laterality: Right;  . INGUINAL HERNIA REPAIR Bilateral   . LAMINECTOMY WITH POSTERIOR LATERAL ARTHRODESIS LEVEL 4 N/A 09/10/2018   Procedure: Posterior Lateral Fusion - Thoracic Eleven-Thoracic Twelve - Thoracic Twelve-Lumbar One - Lumbar One-Lumbar Two - Lumbar Two-Lumbar Three with instrumentaion and PLA;  Surgeon: Kary Kos, MD;  Location: [facility];  Service: Neurosurgery;  Laterality: N/A;  Posterior Lateral Fusion - Thoracic Eleven-Thoracic Twelve - Thoracic Twelve-Lumbar One - Lumbar One-Lumbar Two - Lumbar Two-Lumbar Thre  . LIVER BIOPSY N/A 07/06/2019   Procedure: LIVER BIOPSY;  Surgeon: Virl Cagey, MD;  [facility];  Service: General;  Laterality: N/A;  . LUMBAR PERCUTANEOUS PEDICLE SCREW 1 LEVEL N/A 04/05/2017   Procedure: LUMBAR PERCUTANEOUS PEDICLE SCREW LUMBAR THREE-FOUR;  Surgeon: Kary Kos, MD;  Location: [facility];  Service: Neurosurgery;  Laterality: N/A;  . ORIF CALCANEOUS FRACTURE Right 09/19/2014   Procedure: OPEN REDUCTION INTERNAL FIXATION (ORIF) CALCANEOUS FRACTURE;  Surgeon: Newt Minion, MD;  Location: [facility];  Service: Orthopedics;  Laterality: Right;  . ORIF CALCANEOUS FRACTURE Left  09/19/2014   Procedure: OPEN REDUCTION INTERNAL FIXATION (ORIF) CALCANEOUS FRACTURE;  Surgeon: Newt Minion, MD;  Location: [facility];  Service: Orthopedics;  Laterality: Left;  . POLYPECTOMY  06/05/2019   Procedure: POLYPECTOMY;  Surgeon: Rogene Houston, MD;  [facility];  Service: Endoscopy;;  cecal polyp biopsy forcep, ascending polyp cold snare   Social History   Occupational History    Comment: disabled  Tobacco Use  . Smoking status: Former Smoker    Packs/day: 1.00    Years: 10.00    Pack years: 10.00    Types: Cigars    Start date: 10/08/1976    Quit date: 09/2017    Years since quitting: 2.5  . Smokeless tobacco: Never Used  . Tobacco comment: quit smoking cegar 09/10/2018  Vaping Use  . Vaping Use: Never used  Substance and Sexual Activity  . Alcohol use: No    Comment:  quit 1998  . Drug use: No  . Sexual activity: Yes    Birth control/protection: None

## 2020-03-25 ENCOUNTER — Telehealth: Payer: Self-pay

## 2020-03-25 NOTE — Telephone Encounter (Signed)
Submitted VOB for Monovisc, left knee. 

## 2020-03-28 ENCOUNTER — Ambulatory Visit (INDEPENDENT_AMBULATORY_CARE_PROVIDER_SITE_OTHER): Payer: PPO | Admitting: Gastroenterology

## 2020-03-28 ENCOUNTER — Other Ambulatory Visit: Payer: Self-pay

## 2020-03-28 ENCOUNTER — Encounter (INDEPENDENT_AMBULATORY_CARE_PROVIDER_SITE_OTHER): Payer: Self-pay | Admitting: Gastroenterology

## 2020-03-28 ENCOUNTER — Encounter (INDEPENDENT_AMBULATORY_CARE_PROVIDER_SITE_OTHER): Payer: Self-pay | Admitting: *Deleted

## 2020-03-28 VITALS — BP 136/78 | HR 74 | Temp 98.6°F | Ht 72.0 in | Wt 143.8 lb

## 2020-03-28 DIAGNOSIS — R634 Abnormal weight loss: Secondary | ICD-10-CM

## 2020-03-28 DIAGNOSIS — R6881 Early satiety: Secondary | ICD-10-CM

## 2020-03-28 DIAGNOSIS — R110 Nausea: Secondary | ICD-10-CM

## 2020-03-28 LAB — TSH+FREE T4: TSH W/REFLEX TO FT4: 0.54 mIU/L (ref 0.40–4.50)

## 2020-03-28 MED ORDER — METOCLOPRAMIDE HCL 5 MG PO TABS
5.0000 mg | ORAL_TABLET | Freq: Three times a day (TID) | ORAL | 1 refills | Status: DC
Start: 2020-03-28 — End: 2020-06-14

## 2020-03-28 NOTE — Patient Instructions (Addendum)
We are checking thyroid lab for evaluation. We are starting reglan which can help stomach emptying and nausea. I refilled zofran. Scheduling CT for evaluation.

## 2020-03-28 NOTE — Progress Notes (Signed)
Patient profile: Bradley Espinoza is a 64 y.o. male seen for f/up of nausea and weight loss   History of Present Illness: Bradley Espinoza is seen today for continued symptoms. Reports had CCY in 06/2019, at that time had stomach swelling and bloating and abdominal discomfort. This symptom resolved after the CCY but developed loss of appetite per patient immediately following CCY.   Reports nausea w/ sights and smells. Taking zofran 3-4x/day. Zofran does help symptoms. Reports a decreased appetite since CCY as well. He Is drinknig boost. Denies vomiting. Denies dysphagia. Has early satiety-states eating because "know I have to". Does have some bloating as well.   Reports constipation - feels this may be related to decreased oral intake given poor appetite/early satiety. Having 2 BM/week on average. Denies blood in stool, melena. Feels stools are a normal consistency usually. Occasionally takes laxative if misses several days between stools or feels bloated.   He had an amputation in April 2021 but reports early satiety and nausea was ongoing prior.  Wt Readings from Last 3 Encounters:  03/28/20 143 lb 12.8 oz (65.2 kg)  03/18/20 140 lb (63.5 kg)  02/12/20 140 lb (63.5 kg)   05/2019 - #167lbs  Last Colonoscopy: 05/2019-Normal esophagus.                           - Z-line irregular, 38 cm from the incisors.                           - 2 cm hiatal hernia.                           - Gastritis. Biopsied.                           - Normal duodenal bulb and second portion of the                            duodenum. Last Endoscopy: 3 small polyps diverticulosis Biopsy results given to patient. Gastric biopsy negative for H. Pylori. Colonic polyps are tubular adenomas.   Past Medical History:  Past Medical History:  Diagnosis Date  . Anxiety   . Arthritis    low back pain, lumbar radiculopathy  . Depression   . Fatty liver   . Fracture    B/L ankles  . GERD (gastroesophageal  reflux disease)   . Headache(784.0)    allergy related   . History of kidney stones    passed stones  . History of stomach ulcers   . Retained orthopedic hardware    failed retained hardware right foot  . Wears glasses   . Wears glasses   . Wears partial dentures    upper    Problem List: Patient Active Problem List   Diagnosis Date Noted  . Acute hematogenous osteomyelitis of right foot ([place]) 01/29/2020  . Post-traumatic arthritis of ankle, right   . Cutaneous abscess of right ankle   . Subacute osteomyelitis of right tibia ([place])   . Fatty liver 06/30/2019  . Calculus of gallbladder without cholecystitis without obstruction 06/30/2019  . Nausea without vomiting 05/13/2019  . Abdominal bloating 05/13/2019  . Wound infection following procedure 12/19/2018  . Malunion of joint fusion ([place]) 11/26/2018  . 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[facility])   .  Status post lumbar spinal fusion 09/10/2018  . S/P ankle fusion 11/27/2017  . Nonunion of subtalar arthrodesis   . Avascular necrosis of talus, right ([place])   . Post-traumatic osteoarthritis, left ankle and foot 10/22/2017  . DDD (degenerative disc disease), lumbar 04/05/2017  . Fracture of L2 vertebra ([place]) 01/12/2015  . Acute osteomyelitis of calcaneum ([place]) 10/09/2014  . Dysuria 10/05/2014  . Cellulitis 10/05/2014  . Fall from ladder 09/21/2014  . L2 vertebral fracture ([place]) 09/21/2014  . Bilateral calcaneal fractures 09/21/2014  . Chronic pain 09/21/2014  . Acute blood loss anemia 09/21/2014  . Open fracture of both calcanei 09/15/2014    Past Surgical History: Past Surgical History:  Procedure Laterality Date  . AMPUTATION Right 01/29/2020   Procedure: RIGHT BELOW KNEE AMPUTATION;  Surgeon: Newt Minion, MD;  Location: [place];  Service: Orthopedics;  Laterality: Right;  . ANKLE FUSION Right 05/11/2015   Procedure: Right Posterior Arthroscopic Subtalar Arthrodesis;  Surgeon: Newt Minion, MD;  Location: [place];  Service: Orthopedics;   Laterality: Right;  . ANKLE FUSION Right 11/27/2017   Procedure: RIGHT TIBIOCALCANEAL FUSION;  Surgeon: Newt Minion, MD;  Location: [place];  Service: Orthopedics;  Laterality: Right;  . ANKLE FUSION Right 11/26/2018   Procedure: RIGHT ANTERIOR ANKLE FUSION, APPLY VAC;  Surgeon: Newt Minion, MD;  Location: [place];  Service: Orthopedics;  Laterality: Right;  . ANKLE FUSION Right 09/09/2019   Procedure: REVISION RIGHT ANKLE FUSION;  Surgeon: Newt Minion, MD;  Location: [place];  Service: Orthopedics;  Laterality: Right;  . ANTERIOR LAT LUMBAR FUSION N/A 04/05/2017   Procedure: Extreme Lateral Interbody Fusion - Lumbar three-lumbar four ,exploration of fusion Posterior augmentation with globus addition Removal hardware Lumbar one-three. Lumbar four-sacral one,  Removal internal bone growth stimulator;  Surgeon: Kary Kos, MD;  Location: [place];  Service: Neurosurgery;  Laterality: N/A;  . APPLICATION OF WOUND VAC  12/23/2018   Procedure: Application Of Wound Vac;  Surgeon: Newt Minion, MD;  Location: [place];  Service: Orthopedics;;  . BACK SURGERY  2004   x 2  . BIOPSY  06/05/2019   Procedure: BIOPSY;  Surgeon: Rogene Houston, MD;  [facility];  Service: Endoscopy;;  gastric biopsy  . CERVICAL SPINE SURGERY  2008  . CHOLECYSTECTOMY N/A 07/06/2019   Procedure: LAPAROSCOPIC CHOLECYSTECTOMY;  Surgeon: Virl Cagey, MD;  [facility];  Service: General;  Laterality: N/A;  . COLONOSCOPY WITH PROPOFOL N/A 06/05/2019   Procedure: COLONOSCOPY WITH PROPOFOL;  Surgeon: Rogene Houston, MD;  [facility];  Service: Endoscopy;  Laterality: N/A;  . ESOPHAGOGASTRODUODENOSCOPY    . ESOPHAGOGASTRODUODENOSCOPY (EGD) WITH PROPOFOL N/A 06/05/2019   Procedure: ESOPHAGOGASTRODUODENOSCOPY (EGD) WITH PROPOFOL;  Surgeon: Rogene Houston, MD;  [facility];  Service: Endoscopy;  Laterality: N/A;  1220pm  . HARDWARE REMOVAL Right 10/09/2014   Procedure: Removal Deep Hardware,  Irrigation and Debridement Calcaneus, Place Antibiotic Beads and Wound VAC ;  Surgeon: Newt Minion, MD;  Location: [place];  Service: Orthopedics;  Laterality: Right;  . HARDWARE REMOVAL Right 08/12/2015   Procedure: Removal Deep Hardware Right Foot;  Surgeon: Newt Minion, MD;  Location: [place];  Service: Orthopedics;  Laterality: Right;  . HARDWARE REMOVAL Right 11/26/2018   Procedure: REMOVAL RIGHT TIBIOCALCANEAL NAIL;  Surgeon: Newt Minion, MD;  Location: [place];  Service: Orthopedics;  Laterality: Right;  . HARDWARE REMOVAL Right 12/23/2018   Procedure: RIGHT ANKLE REMOVE  HARDWARE, PLACE ANTIBIOTIC BEADS and placement of wound vac;  Surgeon: Newt Minion, MD;  Location: [place];  Service: Orthopedics;  Laterality: Right;  . HARDWARE REMOVAL Right 09/29/2019   Procedure: RIGHT ANKLE REMOVE HARDWARE, DEBRIDEMENT;  Surgeon: Newt Minion, MD;  Location: [place];  Service: Orthopedics;  Laterality: Right;  . I & D EXTREMITY Right 09/15/2014   Procedure: IRRIGATION AND DEBRIDEMENT Ankle;  Surgeon: Renette Butters, MD;  Location: [place];  Service: Orthopedics;  Laterality: Right;  . INGUINAL HERNIA REPAIR Bilateral   . LAMINECTOMY WITH POSTERIOR LATERAL ARTHRODESIS LEVEL 4 N/A 09/10/2018   Procedure: Posterior Lateral Fusion - Thoracic Eleven-Thoracic Twelve - Thoracic Twelve-Lumbar One - Lumbar One-Lumbar Two - Lumbar Two-Lumbar Three with instrumentaion and PLA;  Surgeon: Kary Kos, MD;  Location: [place];  Service: Neurosurgery;  Laterality: N/A;  Posterior Lateral Fusion - Thoracic Eleven-Thoracic Twelve - Thoracic Twelve-Lumbar One - Lumbar One-Lumbar Two - Lumbar Two-Lumbar Thre  . LIVER BIOPSY N/A 07/06/2019   Procedure: LIVER BIOPSY;  Surgeon: Virl Cagey, MD;  [facility];  Service: General;  Laterality: N/A;  . LUMBAR PERCUTANEOUS PEDICLE SCREW 1 LEVEL N/A 04/05/2017   Procedure: LUMBAR PERCUTANEOUS PEDICLE SCREW LUMBAR THREE-FOUR;  Surgeon: Kary Kos, MD;  Location: [place];  Service:  Neurosurgery;  Laterality: N/A;  . ORIF CALCANEOUS FRACTURE Right 09/19/2014   Procedure: OPEN REDUCTION INTERNAL FIXATION (ORIF) CALCANEOUS FRACTURE;  Surgeon: Newt Minion, MD;  Location: [place];  Service: Orthopedics;  Laterality: Right;  . ORIF CALCANEOUS FRACTURE Left 09/19/2014   Procedure: OPEN REDUCTION INTERNAL FIXATION (ORIF) CALCANEOUS FRACTURE;  Surgeon: Newt Minion, MD;  Location: [place];  Service: Orthopedics;  Laterality: Left;  . POLYPECTOMY  06/05/2019   Procedure: POLYPECTOMY;  Surgeon: Rogene Houston, MD;  [facility];  Service: Endoscopy;;  cecal polyp biopsy forcep, ascending polyp cold snare    Allergies: Allergies  Allergen Reactions  . Doxycycline Nausea Only  . Levofloxacin Other (See Comments)    Blister in mouth  . Codeine Nausea And Vomiting      Home Medications:  Current Outpatient Medications:  .  albuterol (VENTOLIN HFA) 108 (90 Base) MCG/ACT inhaler, Inhale 2 puffs into the lungs every 4 (four) hours as needed., Disp: , Rfl:  .  Cholecalciferol (VITAMIN D) 50 MCG (2000 UT) CAPS, Take 2,000 Units by mouth daily., Disp: , Rfl:  .  Coral Calcium 1000 (390 Ca) MG TABS, Take 1,000 mg by mouth daily., Disp: , Rfl:  .  docusate sodium (COLACE) 100 MG capsule, Take 100 mg by mouth daily as needed for mild constipation. , Disp: , Rfl:  .  gabapentin (NEURONTIN) 300 MG capsule, Take 300 mg by mouth at bedtime., Disp: , Rfl:  .  omeprazole (PRILOSEC) 40 MG capsule, TAKE 1 CAPSULE BY MOUTH ONCE DAILY. (Patient taking differently: Take 40 mg by mouth daily. ), Disp: 90 capsule, Rfl: 0 .  ondansetron (ZOFRAN) 4 MG tablet, TAKE 1 TABLET TWICE DAILY AS NEEDED FOR NAUSEA., Disp: 20 tablet, Rfl: 1 .  oxyCODONE-acetaminophen (PERCOCET) 10-325 MG tablet, Take 1 tablet by mouth every 4 (four) hours as needed for pain., Disp: 30 tablet, Rfl: 0 .  QUETIAPINE FUMARATE PO, Take 50 mg by mouth. Patient states that he takes 1/2 tablet at night as needed., Disp: , Rfl:   .  tamsulosin (FLOMAX) 0.4 MG CAPS capsule, Take 0.8 mg by mouth daily. , Disp: , Rfl:  .  testosterone cypionate (DEPOTESTOSTERONE  CYPIONATE) 200 MG/ML injection, Inject 200 mg into the muscle every 14 (fourteen) days., Disp: , Rfl:  .  vitamin C (ASCORBIC ACID) 500 MG tablet, Take 500 mg by mouth daily., Disp: , Rfl:  .  zinc gluconate 50 MG tablet, Take 50 mg by mouth daily., Disp: , Rfl:  .  metoCLOPramide (REGLAN) 5 MG tablet, Take 1 tablet (5 mg total) by mouth 3 (three) times daily before meals., Disp: 90 tablet, Rfl: 1 .  pregabalin (LYRICA) 50 MG capsule, Take 1 capsule (50 mg total) by mouth 3 (three) times daily. (Patient not taking: Reported on 03/28/2020), Disp: 90 capsule, Rfl: 2 No current facility-administered medications for this visit.  Facility-Administered Medications Ordered in Other Visits:  .  0.45 % sodium chloride infusion, , Intravenous, Continuous, Rayburn, Shawn Montgomery, PA-C .  docusate sodium (COLACE) capsule 100 mg, 100 mg, Oral, BID, Rayburn, Shawn Montgomery, PA-C .  oxyCODONE (Oxy IR/ROXICODONE) immediate release tablet 5-10 mg, 5-10 mg, Oral, Q3H PRN, Rayburn, Neta Mends, PA-C .  senna (SENOKOT) tablet 8.6 mg, 1 tablet, Oral, BID, Rayburn, Shawn Montgomery, PA-C .  sorbitol 70 % solution 30 mL, 30 mL, Oral, Daily PRN, Rayburn, Neta Mends, PA-C   Family History: family history includes Cancer in an other family member; Diabetes in his father.    Social History:   reports that he quit smoking about 2 years ago. His smoking use included cigars. He started smoking about 43 years ago. He has a 10.00 pack-year smoking history. He has never used smokeless tobacco. He reports that he does not drink alcohol and does not use drugs.   Review of Systems: Constitutional: Denies weight loss/weight gain  Eyes: No changes in vision. ENT: No oral lesions, sore throat.  GI: see HPI.  Heme/Lymph: No easy bruising.  CV: No chest pain.  GU: No hematuria.    Integumentary: No rashes.  Neuro: No headaches.  Psych: No depression/anxiety.  Endocrine: No heat/cold intolerance.  Allergic/Immunologic: No urticaria.  Resp: No cough, SOB.  Musculoskeletal: No joint swelling.    Physical Examination: BP 136/78 (BP Location: Right Arm, Patient Position: Sitting, Cuff Size: Normal)   Pulse 74   Temp 98.6 F (37 C) (Oral)   Ht 6' (1.829 m)   Wt 143 lb 12.8 oz (65.2 kg)   BMI 19.50 kg/m  Gen: NAD, alert and oriented x 4 HEENT: PEERLA, EOMI, Neck: supple, no JVD Chest: CTA bilaterally, no wheezes, crackles, or other adventitious sounds CV: RRR, no m/g/c/r Abd: soft, NT, ND, +BS in all four quadrants; no HSM, guarding, ridigity, or rebound tenderness Ext: no edema, well perfused with 2+ pulses, Skin: no rash or lesions noted on observed skin Lymph: no noted LAD  Data Reviewed:   [place] Fibroscan 12/2018--Corresponding Metavir fibrosis score:  F2 + some F3. Risk of fibrosis: Moderate    09/2019-CBC normal  01/2020-CBC normal   The biopsy shows liver with a well-preserved architecture. Overall,  portal tracts alternate fairly regularly with central veins and normally  radiating hepatocellular trabeculae. Portal tracts are compact and are  generally devoid of an inflammatory cell infiltrate. Bile ducts are seen  in nearly all portal tracts and are histologically unremarkable. Portal  vascular structures are histologically unremarkable. Hepatic lobules  show mild, mostly large droplet macrovesicular steatosis (about 15%)  without evidence of steatohepatitis. There is no cholestasis or  significant inflammation. Central veins are unremarkable. Trichrome and  reticulin stains show no evidence of pathologic fibrosis. Iron stain  shows no stainable  iron. PASD stain is negative for globular  intracytoplasmic inclusions.   Assessment/Plan: Mr. Bradley Espinoza is a 64 y.o. male seen for eval of early satiety.   1.  Early satiety/nausea/wt loss-ongoing  since CCY 06/2019.  He had an endoscopy last year that was unremarkable.  He brings labs today from June 7,2021 with normal CBC and CMP.  We will check his thyroid.  Suspect he may have component of gastroparesis given his daily narcotic use (Percocet 10 mg 4 times a day).  He is on Prilosec 40 mg daily.  We will add low-dose Reglan-to contact me if develops side effects of tremor, etc. which was reviewed today.  We will also check CT scan for further evaluation.   F/up 4 weeks   Bradley Espinoza was seen today for follow-up.  Diagnoses and all orders for this visit:  Nausea without vomiting -     TSH + free T4 -     CT Abdomen Pelvis W Contrast; Future  Loss of weight -     TSH + free T4 -     CT Abdomen Pelvis W Contrast; Future  Early satiety -     CT Abdomen Pelvis W Contrast; Future  Other orders -     metoCLOPramide (REGLAN) 5 MG tablet; Take 1 tablet (5 mg total) by mouth 3 (three) times daily before meals.      I personally performed the service, non-incident to. (WP)  Laurine Blazer, [facility]

## 2020-03-29 MED ORDER — ONDANSETRON HCL 4 MG PO TABS: 4.0000 mg | ORAL_TABLET | Freq: Three times a day (TID) | ORAL | 1 refills | Status: DC | PRN

## 2020-04-05 ENCOUNTER — Telehealth: Payer: Self-pay

## 2020-04-05 NOTE — Telephone Encounter (Signed)
Approved, Monovisc, left knee. [place] Patient will be responsible for 20% OOP. Co-pay of $30.00 No PA required

## 2020-04-07 ENCOUNTER — Encounter: Payer: Self-pay | Admitting: Physician Assistant

## 2020-04-07 ENCOUNTER — Ambulatory Visit: Payer: PPO | Admitting: Orthopedic Surgery

## 2020-04-07 ENCOUNTER — Other Ambulatory Visit: Payer: Self-pay

## 2020-04-07 VITALS — Ht 72.0 in | Wt 143.0 lb

## 2020-04-07 DIAGNOSIS — M1712 Unilateral primary osteoarthritis, left knee: Secondary | ICD-10-CM

## 2020-04-07 DIAGNOSIS — S81801D Unspecified open wound, right lower leg, subsequent encounter: Secondary | ICD-10-CM | POA: Diagnosis not present

## 2020-04-07 DIAGNOSIS — M47816 Spondylosis without myelopathy or radiculopathy, lumbar region: Secondary | ICD-10-CM | POA: Diagnosis not present

## 2020-04-07 DIAGNOSIS — Z89511 Acquired absence of right leg below knee: Secondary | ICD-10-CM

## 2020-04-07 DIAGNOSIS — Z981 Arthrodesis status: Secondary | ICD-10-CM | POA: Diagnosis not present

## 2020-04-08 ENCOUNTER — Encounter: Payer: Self-pay | Admitting: Orthopedic Surgery

## 2020-04-08 DIAGNOSIS — M1712 Unilateral primary osteoarthritis, left knee: Secondary | ICD-10-CM

## 2020-04-08 MED ORDER — METHYLPREDNISOLONE ACETATE 40 MG/ML IJ SUSP
40.0000 mg | INTRAMUSCULAR | Status: AC | PRN
  Administered 2020-04-08: 40 mg via INTRA_ARTICULAR

## 2020-04-08 MED ORDER — HYALURONAN 88 MG/4ML IX SOSY
88.0000 mg | PREFILLED_SYRINGE | INTRA_ARTICULAR | Status: AC | PRN
  Administered 2020-04-08: 88 mg via INTRA_ARTICULAR

## 2020-04-08 MED ORDER — LIDOCAINE HCL 1 % IJ SOLN
1.0000 mL | INTRAMUSCULAR | Status: AC | PRN
  Administered 2020-04-08: 1 mL

## 2020-04-08 NOTE — Progress Notes (Signed)
Office Visit Note   Patient: Bradley Espinoza           Date of Birth: [birth year]           MRN: [id number] Visit Date: 04/07/2020              Requested by: Celene Squibb, MD [place],  [place] [address] PCP: Celene Squibb, MD  Chief Complaint  Patient presents with  . Left Knee - Follow-up    monovisc buy and bill       HPI: Patient is a 64 year old gentleman who presents for 2 separate issues.  #1 patient is status post transtibial amputation.  He just obtained his leg and is starting to walk today.  Patient also has traumatic osteoarthritis of the left knee he has had temporary relief with previous steroid injection presents at this time for hyaluronic acid injection of the left knee.  Assessment & Plan: Visit Diagnoses:  1. Acquired absence of right leg below knee ([place])   2. Unilateral primary osteoarthritis, left knee     Plan: Patient tolerated the injection well we will make an appointment with [facility] for gait training for his prosthesis.  Follow-Up Instructions: Return in about 3 weeks (around 04/28/2020).   Ortho Exam  Patient is alert, oriented, no adenopathy, well-dressed, normal affect, normal respiratory effort. Examination patient is ambulating with his new prosthesis.  Patient has no redness or cellulitis around the left knee there is no effusion.  Patient wishes to proceed with a hyaluronic acid injection.  Imaging: No results found. No images are attached to the encounter.  Labs: Lab Results  Component Value Date   HGBA1C 5.1 06/26/2016   ESRSEDRATE 9 12/08/2018   REPTSTATUS 10/04/2019 FINAL 09/29/2019   GRAMSTAIN  09/29/2019    ABUNDANT WBC PRESENT, PREDOMINANTLY PMN FEW GRAM POSITIVE COCCI    CULT  09/29/2019    FEW STAPHYLOCOCCUS AUREUS NO ANAEROBES ISOLATED Performed at [facility], [place] [address], [place], [place] [address]    LABORGA STAPHYLOCOCCUS AUREUS 09/29/2019     Lab Results  Component Value Date    ALBUMIN 4.4 07/02/2019   ALBUMIN 4.3 08/12/2015   ALBUMIN 4.1 05/06/2015    No results found for: MG No results found for: VD25OH  No results found for: PREALBUMIN CBC EXTENDED Latest Ref Rng & Units 01/29/2020 09/29/2019 09/09/2019  WBC 4.0 - 10.5 K/uL 6.7 6.8 6.0  RBC 4.22 - 5.81 MIL/uL 5.34 4.67 5.72  HGB 13.0 - 17.0 g/dL 15.2 13.1 16.4  HCT 39 - 52 % 48.1 42.0 51.1  PLT 150 - 400 K/uL 221 305 232  NEUTROABS 1.7 - 7.7 K/uL - - -  LYMPHSABS 0.7 - 4.0 K/uL - - -     Body mass index is 19.39 kg/m.  Orders:  Orders Placed This Encounter  Procedures  . Ambulatory referral to Physical Therapy   No orders of the defined types were placed in this encounter.    Procedures: Large Joint Inj: L knee on 04/08/2020 2:02 PM Indications: pain and diagnostic evaluation Details: 22 G 1.5 in needle, anteromedial approach  Arthrogram: No  Medications: 40 mg methylPREDNISolone acetate 40 MG/ML; 1 mL lidocaine 1 %; 88 mg Hyaluronan 88 MG/4ML Outcome: tolerated well, no immediate complications Procedure, treatment alternatives, risks and benefits explained, specific risks discussed. Consent was given by the patient. Immediately prior to procedure a time out was called to verify the correct patient, procedure, equipment, support staff  and site/side marked as required. Patient was prepped and draped in the usual sterile fashion.      Clinical Data: No additional findings.  ROS:  All other systems negative, except as noted in the HPI. Review of Systems  Objective: Vital Signs: Ht 6' (1.829 m)   Wt 143 lb (64.9 kg)   BMI 19.39 kg/m   Specialty Comments:  No specialty comments available.  PMFS History: Patient Active Problem List   Diagnosis Date Noted  . Acute hematogenous osteomyelitis of right foot ([place]) 01/29/2020  . Post-traumatic arthritis of ankle, right   . Cutaneous abscess of right ankle   . Subacute osteomyelitis of right tibia ([place])   . Fatty liver 06/30/2019  .  Calculus of gallbladder without cholecystitis without obstruction 06/30/2019  . Nausea without vomiting 05/13/2019  . Abdominal bloating 05/13/2019  . Wound infection following procedure 12/19/2018  . Malunion of joint fusion ([place]) 11/26/2018  . 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[facility])   . Status post lumbar spinal fusion 09/10/2018  . S/P ankle fusion 11/27/2017  . Nonunion of subtalar arthrodesis   . Avascular necrosis of talus, right ([place])   . Post-traumatic osteoarthritis, left ankle and foot 10/22/2017  . DDD (degenerative disc disease), lumbar 04/05/2017  . Fracture of L2 vertebra ([place]) 01/12/2015  . Acute osteomyelitis of calcaneum ([place]) 10/09/2014  . Dysuria 10/05/2014  . Cellulitis 10/05/2014  . Fall from ladder 09/21/2014  . L2 vertebral fracture ([place]) 09/21/2014  . Bilateral calcaneal fractures 09/21/2014  . Chronic pain 09/21/2014  . Acute blood loss anemia 09/21/2014  . Open fracture of both calcanei 09/15/2014   Past Medical History:  Diagnosis Date  . Anxiety   . Arthritis    low back pain, lumbar radiculopathy  . Depression   . Fatty liver   . Fracture    B/L ankles  . GERD (gastroesophageal reflux disease)   . Headache(784.0)    allergy related   . History of kidney stones    passed stones  . History of stomach ulcers   . Retained orthopedic hardware    failed retained hardware right foot  . Wears glasses   . Wears glasses   . Wears partial dentures    upper    Family History  Problem Relation Age of Onset  . Diabetes Father   . Cancer Other     Past Surgical History:  Procedure Laterality Date  . AMPUTATION Right 01/29/2020   Procedure: RIGHT BELOW KNEE AMPUTATION;  Surgeon: Newt Minion, MD;  Location: [facility];  Service: Orthopedics;  Laterality: Right;  . ANKLE FUSION Right 05/11/2015   Procedure: Right Posterior Arthroscopic Subtalar Arthrodesis;  Surgeon: Newt Minion, MD;  Location: [facility];  Service: Orthopedics;  Laterality: Right;  . ANKLE FUSION Right  11/27/2017   Procedure: RIGHT TIBIOCALCANEAL FUSION;  Surgeon: Newt Minion, MD;  Location: [facility];  Service: Orthopedics;  Laterality: Right;  . ANKLE FUSION Right 11/26/2018   Procedure: RIGHT ANTERIOR ANKLE FUSION, APPLY VAC;  Surgeon: Newt Minion, MD;  Location: [facility];  Service: Orthopedics;  Laterality: Right;  . ANKLE FUSION Right 09/09/2019   Procedure: REVISION RIGHT ANKLE FUSION;  Surgeon: Newt Minion, MD;  Location: [facility];  Service: Orthopedics;  Laterality: Right;  . ANTERIOR LAT LUMBAR FUSION N/A 04/05/2017   Procedure: Extreme Lateral Interbody Fusion - Lumbar three-lumbar four ,exploration of fusion Posterior augmentation with globus addition Removal hardware Lumbar one-three. Lumbar four-sacral one,  Removal internal bone growth stimulator;  Surgeon: Kary Kos, MD;  Location: [facility];  Service: Neurosurgery;  Laterality: N/A;  . APPLICATION OF WOUND VAC  12/23/2018   Procedure: Application Of Wound Vac;  Surgeon: Newt Minion, MD;  Location: [facility];  Service: Orthopedics;;  . BACK SURGERY  2004   x 2  . BIOPSY  06/05/2019   Procedure: BIOPSY;  Surgeon: Rogene Houston, MD;  [facility];  Service: Endoscopy;;  gastric biopsy  . CERVICAL SPINE SURGERY  2008  . CHOLECYSTECTOMY N/A 07/06/2019   Procedure: LAPAROSCOPIC CHOLECYSTECTOMY;  Surgeon: Virl Cagey, MD;  [facility];  Service: General;  Laterality: N/A;  . COLONOSCOPY WITH PROPOFOL N/A 06/05/2019   Procedure: COLONOSCOPY WITH PROPOFOL;  Surgeon: Rogene Houston, MD;  [facility];  Service: Endoscopy;  Laterality: N/A;  . ESOPHAGOGASTRODUODENOSCOPY    . ESOPHAGOGASTRODUODENOSCOPY (EGD) WITH PROPOFOL N/A 06/05/2019   Procedure: ESOPHAGOGASTRODUODENOSCOPY (EGD) WITH PROPOFOL;  Surgeon: Rogene Houston, MD;  [facility];  Service: Endoscopy;  Laterality: N/A;  1220pm  . HARDWARE REMOVAL Right 10/09/2014   Procedure: Removal Deep Hardware, Irrigation and Debridement Calcaneus, Place  Antibiotic Beads and Wound VAC ;  Surgeon: Newt Minion, MD;  Location: [facility];  Service: Orthopedics;  Laterality: Right;  . HARDWARE REMOVAL Right 08/12/2015   Procedure: Removal Deep Hardware Right Foot;  Surgeon: Newt Minion, MD;  Location: [facility];  Service: Orthopedics;  Laterality: Right;  . HARDWARE REMOVAL Right 11/26/2018   Procedure: REMOVAL RIGHT TIBIOCALCANEAL NAIL;  Surgeon: Newt Minion, MD;  Location: [facility];  Service: Orthopedics;  Laterality: Right;  . HARDWARE REMOVAL Right 12/23/2018   Procedure: RIGHT ANKLE REMOVE HARDWARE, PLACE ANTIBIOTIC BEADS and placement of wound vac;  Surgeon: Newt Minion, MD;  Location: [facility];  Service: Orthopedics;  Laterality: Right;  . HARDWARE REMOVAL Right 09/29/2019   Procedure: RIGHT ANKLE REMOVE HARDWARE, DEBRIDEMENT;  Surgeon: Newt Minion, MD;  Location: [facility];  Service: Orthopedics;  Laterality: Right;  . I & D EXTREMITY Right 09/15/2014   Procedure: IRRIGATION AND DEBRIDEMENT Ankle;  Surgeon: Renette Butters, MD;  Location: [facility];  Service: Orthopedics;  Laterality: Right;  . INGUINAL HERNIA REPAIR Bilateral   . LAMINECTOMY WITH POSTERIOR LATERAL ARTHRODESIS LEVEL 4 N/A 09/10/2018   Procedure: Posterior Lateral Fusion - Thoracic Eleven-Thoracic Twelve - Thoracic Twelve-Lumbar One - Lumbar One-Lumbar Two - Lumbar Two-Lumbar Three with instrumentaion and PLA;  Surgeon: Kary Kos, MD;  Location: [facility];  Service: Neurosurgery;  Laterality: N/A;  Posterior Lateral Fusion - Thoracic Eleven-Thoracic Twelve - Thoracic Twelve-Lumbar One - Lumbar One-Lumbar Two - Lumbar Two-Lumbar Thre  . LIVER BIOPSY N/A 07/06/2019   Procedure: LIVER BIOPSY;  Surgeon: Virl Cagey, MD;  [facility];  Service: General;  Laterality: N/A;  . LUMBAR PERCUTANEOUS PEDICLE SCREW 1 LEVEL N/A 04/05/2017   Procedure: LUMBAR PERCUTANEOUS PEDICLE SCREW LUMBAR THREE-FOUR;  Surgeon: Kary Kos, MD;  Location: [facility];  Service: Neurosurgery;  Laterality: N/A;  . ORIF  CALCANEOUS FRACTURE Right 09/19/2014   Procedure: OPEN REDUCTION INTERNAL FIXATION (ORIF) CALCANEOUS FRACTURE;  Surgeon: Newt Minion, MD;  Location: [facility];  Service: Orthopedics;  Laterality: Right;  . ORIF CALCANEOUS FRACTURE Left 09/19/2014   Procedure: OPEN REDUCTION INTERNAL FIXATION (ORIF) CALCANEOUS FRACTURE;  Surgeon: Newt Minion, MD;  Location: [facility];  Service: Orthopedics;  Laterality: Left;  . POLYPECTOMY  06/05/2019   Procedure: POLYPECTOMY;  Surgeon: Rogene Houston, MD;  [facility];  Service: Endoscopy;;  cecal polyp biopsy forcep, ascending polyp cold snare   Social History   Occupational History    Comment: disabled  Tobacco Use  . Smoking status: Former Smoker    Packs/day: 1.00    Years: 10.00    Pack years: 10.00    Types: Cigars    Start date: 10/08/1976    Quit date: 09/2017    Years since quitting: 2.5  . Smokeless tobacco: Never Used  . Tobacco comment: quit smoking cegar 09/10/2018  Vaping Use  . Vaping Use: Never used  Substance and Sexual Activity  . Alcohol use: No    Comment:  quit 1998  . Drug use: No  . Sexual activity: Yes    Birth control/protection: None

## 2020-04-14 ENCOUNTER — Encounter: Payer: Self-pay | Admitting: Physical Therapy

## 2020-04-14 ENCOUNTER — Ambulatory Visit (INDEPENDENT_AMBULATORY_CARE_PROVIDER_SITE_OTHER): Payer: PPO | Admitting: Physical Therapy

## 2020-04-14 ENCOUNTER — Other Ambulatory Visit: Payer: Self-pay

## 2020-04-14 DIAGNOSIS — R2681 Unsteadiness on feet: Secondary | ICD-10-CM

## 2020-04-14 DIAGNOSIS — M25661 Stiffness of right knee, not elsewhere classified: Secondary | ICD-10-CM | POA: Diagnosis not present

## 2020-04-14 DIAGNOSIS — M25562 Pain in left knee: Secondary | ICD-10-CM | POA: Diagnosis not present

## 2020-04-14 DIAGNOSIS — R2689 Other abnormalities of gait and mobility: Secondary | ICD-10-CM

## 2020-04-14 DIAGNOSIS — M79661 Pain in right lower leg: Secondary | ICD-10-CM | POA: Diagnosis not present

## 2020-04-14 DIAGNOSIS — M545 Low back pain, unspecified: Secondary | ICD-10-CM

## 2020-04-14 DIAGNOSIS — M25662 Stiffness of left knee, not elsewhere classified: Secondary | ICD-10-CM

## 2020-04-14 DIAGNOSIS — Z9181 History of falling: Secondary | ICD-10-CM

## 2020-04-14 DIAGNOSIS — G8929 Other chronic pain: Secondary | ICD-10-CM | POA: Diagnosis not present

## 2020-04-14 DIAGNOSIS — M6281 Muscle weakness (generalized): Secondary | ICD-10-CM | POA: Diagnosis not present

## 2020-04-15 NOTE — Therapy (Signed)
[facility] [address] [place], [place], [address] Phone: [phone]   Fax:  [phone]  Physical Therapy Evaluation  Patient Details  Name: Bradley Espinoza MRN: [id number] Date of Birth: [birth year] Referring Provider (PT): Meridee Score, MD   Encounter Date: 04/14/2020   PT End of Session - 04/14/20 1806    Visit Number 1    Number of Visits 25    Date for PT Re-Evaluation 07/13/20    Authorization Type [employer] P[employer]    Authorization Time Period $15 co-pay    Progress Note Due on Visit 10    PT Start Time 1013    PT Stop Time 1100    PT Time Calculation (min) 47 min    Activity Tolerance Patient tolerated treatment well;Patient limited by pain    Behavior During Therapy [facility] for tasks assessed/performed           Past Medical History:  Diagnosis Date  . Anxiety   . Arthritis    low back pain, lumbar radiculopathy  . Depression   . Fatty liver   . Fracture    B/L ankles  . GERD (gastroesophageal reflux disease)   . Headache(784.0)    allergy related   . History of kidney stones    passed stones  . History of stomach ulcers   . Retained orthopedic hardware    failed retained hardware right foot  . Wears glasses   . Wears glasses   . Wears partial dentures    upper    Past Surgical History:  Procedure Laterality Date  . AMPUTATION Right 01/29/2020   Procedure: RIGHT BELOW KNEE AMPUTATION;  Surgeon: Newt Minion, MD;  Location: [place];  Service: Orthopedics;  Laterality: Right;  . ANKLE FUSION Right 05/11/2015   Procedure: Right Posterior Arthroscopic Subtalar Arthrodesis;  Surgeon: Newt Minion, MD;  Location: [place];  Service: Orthopedics;  Laterality: Right;  . ANKLE FUSION Right 11/27/2017   Procedure: RIGHT TIBIOCALCANEAL FUSION;  Surgeon: Newt Minion, MD;  Location: [place];  Service: Orthopedics;  Laterality: Right;  . ANKLE FUSION Right 11/26/2018   Procedure: RIGHT ANTERIOR ANKLE FUSION, APPLY VAC;   Surgeon: Newt Minion, MD;  Location: [place];  Service: Orthopedics;  Laterality: Right;  . ANKLE FUSION Right 09/09/2019   Procedure: REVISION RIGHT ANKLE FUSION;  Surgeon: Newt Minion, MD;  Location: [place];  Service: Orthopedics;  Laterality: Right;  . ANTERIOR LAT LUMBAR FUSION N/A 04/05/2017   Procedure: Extreme Lateral Interbody Fusion - Lumbar three-lumbar four ,exploration of fusion Posterior augmentation with globus addition Removal hardware Lumbar one-three. Lumbar four-sacral one,  Removal internal bone growth stimulator;  Surgeon: Kary Kos, MD;  Location: [place];  Service: Neurosurgery;  Laterality: N/A;  . APPLICATION OF WOUND VAC  12/23/2018   Procedure: Application Of Wound Vac;  Surgeon: Newt Minion, MD;  Location: [place];  Service: Orthopedics;;  . BACK SURGERY  2004   x 2  . BIOPSY  06/05/2019   Procedure: BIOPSY;  Surgeon: Rogene Houston, MD;  [facility];  Service: Endoscopy;;  gastric biopsy  . CERVICAL SPINE SURGERY  2008  . CHOLECYSTECTOMY N/A 07/06/2019   Procedure: LAPAROSCOPIC CHOLECYSTECTOMY;  Surgeon: Virl Cagey, MD;  [facility];  Service: General;  Laterality: N/A;  . COLONOSCOPY WITH PROPOFOL N/A 06/05/2019   Procedure: COLONOSCOPY WITH PROPOFOL;  Surgeon: Rogene Houston, MD;  [facility];  Service: Endoscopy;  Laterality: N/A;  .  ESOPHAGOGASTRODUODENOSCOPY    . ESOPHAGOGASTRODUODENOSCOPY (EGD) WITH PROPOFOL N/A 06/05/2019   Procedure: ESOPHAGOGASTRODUODENOSCOPY (EGD) WITH PROPOFOL;  Surgeon: Rogene Houston, MD;  [facility];  Service: Endoscopy;  Laterality: N/A;  1220pm  . HARDWARE REMOVAL Right 10/09/2014   Procedure: Removal Deep Hardware, Irrigation and Debridement Calcaneus, Place Antibiotic Beads and Wound VAC ;  Surgeon: Newt Minion, MD;  Location: [place];  Service: Orthopedics;  Laterality: Right;  . HARDWARE REMOVAL Right 08/12/2015   Procedure: Removal Deep Hardware Right Foot;  Surgeon: Newt Minion,  MD;  Location: [place];  Service: Orthopedics;  Laterality: Right;  . HARDWARE REMOVAL Right 11/26/2018   Procedure: REMOVAL RIGHT TIBIOCALCANEAL NAIL;  Surgeon: Newt Minion, MD;  Location: [place];  Service: Orthopedics;  Laterality: Right;  . HARDWARE REMOVAL Right 12/23/2018   Procedure: RIGHT ANKLE REMOVE HARDWARE, PLACE ANTIBIOTIC BEADS and placement of wound vac;  Surgeon: Newt Minion, MD;  Location: [place];  Service: Orthopedics;  Laterality: Right;  . HARDWARE REMOVAL Right 09/29/2019   Procedure: RIGHT ANKLE REMOVE HARDWARE, DEBRIDEMENT;  Surgeon: Newt Minion, MD;  Location: [place];  Service: Orthopedics;  Laterality: Right;  . I & D EXTREMITY Right 09/15/2014   Procedure: IRRIGATION AND DEBRIDEMENT Ankle;  Surgeon: Renette Butters, MD;  Location: [place];  Service: Orthopedics;  Laterality: Right;  . INGUINAL HERNIA REPAIR Bilateral   . LAMINECTOMY WITH POSTERIOR LATERAL ARTHRODESIS LEVEL 4 N/A 09/10/2018   Procedure: Posterior Lateral Fusion - Thoracic Eleven-Thoracic Twelve - Thoracic Twelve-Lumbar One - Lumbar One-Lumbar Two - Lumbar Two-Lumbar Three with instrumentaion and PLA;  Surgeon: Kary Kos, MD;  Location: [place];  Service: Neurosurgery;  Laterality: N/A;  Posterior Lateral Fusion - Thoracic Eleven-Thoracic Twelve - Thoracic Twelve-Lumbar One - Lumbar One-Lumbar Two - Lumbar Two-Lumbar Thre  . LIVER BIOPSY N/A 07/06/2019   Procedure: LIVER BIOPSY;  Surgeon: Virl Cagey, MD;  [facility];  Service: General;  Laterality: N/A;  . LUMBAR PERCUTANEOUS PEDICLE SCREW 1 LEVEL N/A 04/05/2017   Procedure: LUMBAR PERCUTANEOUS PEDICLE SCREW LUMBAR THREE-FOUR;  Surgeon: Kary Kos, MD;  Location: [place];  Service: Neurosurgery;  Laterality: N/A;  . ORIF CALCANEOUS FRACTURE Right 09/19/2014   Procedure: OPEN REDUCTION INTERNAL FIXATION (ORIF) CALCANEOUS FRACTURE;  Surgeon: Newt Minion, MD;  Location: [place];  Service: Orthopedics;  Laterality: Right;  . ORIF CALCANEOUS FRACTURE Left  09/19/2014   Procedure: OPEN REDUCTION INTERNAL FIXATION (ORIF) CALCANEOUS FRACTURE;  Surgeon: Newt Minion, MD;  Location: [place];  Service: Orthopedics;  Laterality: Left;  . POLYPECTOMY  06/05/2019   Procedure: POLYPECTOMY;  Surgeon: Rogene Houston, MD;  [facility];  Service: Endoscopy;;  cecal polyp biopsy forcep, ascending polyp cold snare    There were no vitals filed for this visit.    Subjective Assessment - 04/14/20 1016    Subjective his 64yo male was referred to PT on 04/07/2020 by Meridee Score, MD for prosthetic training with Right BKA. Patient reports bad fall ~10 years ago fracturing his back & both ankles. He underwent ORIF for right ankle fracture on 11/27/2017 & followup surgeries 11/26/2018, 12/23/2018, 09/09/2019, 09/29/2019.  He underwent a right Transtibial Amputation on 01/29/2020. He received 04/07/2020 from [employer] with [employer], CPO.    Pertinent History Right TTA, Posterior Lateral Fusion T11-L3 09/2018, OA LBP & left knee, anxiety, depression    Limitations Standing;Walking;House hold activities    Patient Stated Goals To return to work in [employer],  use prosthesis to be active    Currently in Pain? Yes    Pain Score 9    8/10 sitting with prosthesis, 9/10 walking, no pain without prosthesis   Pain Location Leg    Pain Orientation Right    Pain Descriptors / Indicators Squeezing    Pain Type Acute pain    Pain Onset In the past 7 days    Pain Frequency Intermittent    Aggravating Factors  prosthesis wear    Pain Relieving Factors removing prosthesis.    Multiple Pain Sites Yes    Pain Score 7   In last week, worst 8/10, best 6/10   Pain Location Back    Pain Orientation Lower;Mid    Pain Descriptors / Indicators Aching    Pain Type Chronic pain    Pain Onset More than a month ago    Pain Frequency Constant    Aggravating Factors  standing & bending, lifting    Pain Relieving Factors injections, lay down, sit    Effect of Pain on Daily  Activities limits standing & walking    Pain Score 5   Injection last week, worst 8-9/10, best 5/10   Pain Location Knee    Pain Orientation Left    Pain Descriptors / Indicators Aching    Pain Type Chronic pain    Pain Onset More than a month ago    Pain Frequency Constant    Aggravating Factors  standing & walking    Pain Relieving Factors injections,              [facility] PT Assessment - 04/14/20 1015      Assessment   Medical Diagnosis Right Transtibial Amputation    Referring Provider (PT) Meridee Score, MD    Onset Date/Surgical Date 04/07/20   prosthesis delivery   Hand Dominance Right    Prior Therapy 2019 after previous surgeries      Precautions   Precautions Fall      Balance Screen   Has the patient fallen in the past 6 months Yes    How many times? 3   no injuries, lost balance   Has the patient had a decrease in activity level because of a fear of falling?  Yes    Is the patient reluctant to leave their home because of a fear of falling?  Yes      [place] residence    Living Arrangements Alone    Type of [place] to enter    Entrance Stairs-Number of Steps 1   front has 1, used to use back 3 steps no rail   Entrance Stairs-Rails None    Home Layout One level    Home Equipment Crutches;Walker - 2 wheels;Cane - single point;Shower seat;Wheelchair - manual      Prior Function   Level of Independence Independent with household mobility with device;Independent with community mobility with device   since fall in 2018 used crutches or RW with limited WB RLE   Vocation On disability    Leisure farm, raises sheep      ROM / Strength   AROM / PROM / Strength PROM;Strength      PROM   Overall PROM  Deficits    Overall PROM Comments functional limitations in trunk & hip / knee extension.     Right Knee Extension -9   seated at edge of chair with heel on floor  Left Knee Extension -8   seated at edge of  chair with heel on floor     Strength   Overall Strength Deficits    Overall Strength Comments BUEs grossly WNL    Right Hip Flexion 4/5    Right Hip Extension 3/5   gross functional    Right Hip ABduction 3+/5   gross functional    Left Hip Flexion 4/5    Left Hip Extension 3/5   gross functional    Left Hip ABduction 3+/5   gross functional    Right Knee Flexion 4/5   gross functional    Right Knee Extension 3+/5    Left Knee Flexion 4/5   gross functional    Left Knee Extension 3/5      Transfers   Transfers Sit to Stand;Stand to Sit    Sit to Stand 5: Supervision;With upper extremity assist;With armrests;From chair/3-in-1    Stand to Sit 5: Supervision;With upper extremity assist;With armrests;To chair/3-in-1      Ambulation/Gait   Ambulation/Gait Yes    Ambulation/Gait Assistance 5: Supervision    Ambulation/Gait Assistance Details PWB on prosthesis due to pain with excessive UE weight bearing.      Ambulation Distance (Feet) 100 Feet    Assistive device Crutches;Prosthesis    Gait Pattern Step-through pattern;Decreased stance time - right;Decreased step length - left;Decreased hip/knee flexion - right;Decreased weight shift to right;Right hip hike;Right foot flat;Right flexed knee in stance;Left flexed knee in stance;Antalgic;Lateral hip instability;Trunk flexed;Abducted- right    Ambulation Surface Level;Indoor    Gait velocity 0.98 ft/sec      Standardized Balance Assessment   Standardized Balance Assessment Berg Balance Test      Berg Balance Test   Sit to Stand Able to stand  independently using hands    Standing Unsupported Able to stand 2 minutes with supervision    Sitting with Back Unsupported but Feet Supported on Floor or Stool Able to sit safely and securely 2 minutes    Stand to Sit Controls descent by using hands    Transfers Able to transfer safely, definite need of hands    Standing Unsupported with Eyes Closed Unable to keep eyes closed 3 seconds but  stays steady    Standing Unsupported with Feet Together Needs help to attain position but able to stand for 30 seconds with feet together    From Standing, Reach Forward with Outstretched Arm Reaches forward but needs supervision    From Standing Position, Pick up Object from [place] to pick up shoe, needs supervision    From Standing Position, Turn to Look Behind Over each Shoulder Needs supervision when turning    Turn 360 Degrees Needs assistance while turning    Standing Unsupported, Alternately Place Feet on Step/Stool Needs assistance to keep from falling or unable to try    Standing Unsupported, One Foot in Front Needs help to step but can hold 15 seconds    Standing on One Leg Able to lift leg independently and hold equal to or more than 3 seconds    Total Score 26           Prosthetics Assessment - 04/14/20 1015      Prosthetics   Prosthetic Care Dependent with Skin check;Residual limb care;Care of non-amputated limb;Prosthetic cleaning;Ply sock cleaning;Correct ply sock adjustment;Proper wear schedule/adjustment;Proper weight-bearing schedule/adjustment    Donning prosthesis  Supervision    Doffing prosthesis  Supervision    Current prosthetic wear tolerance (days/week)  daily  Current prosthetic wear tolerance (#hours/day)  15 minutes 1-3 x/day    Current prosthetic weight-bearing tolerance (hours/day)  Patient tolerates standing for 5 minutes with partial weight on prosthesis with limb pain 8-9/10.      Edema pitting edema    Residual limb condition  superficial opening at distal tibia with no drainage,  scar adhered at distal tibia, good hair growth, normal color & moisture.      Prosthesis Description Cool Blue Silicon liner with shuttle pin lock, flex foot    K code/activity level with prosthetic use  K3 full community with variable cadence.                      Objective measurements completed on examination: See above findings.       [place] [facility]  Treatment/Exercise - 04/14/20 1015      Prosthetics   Prosthetic Care Comments  scar mobilizations to decrease adherance,  proper donning with pin angle slightly posterior, initiate wear daily liner 2 hrs 2-3 /day with prosthesis during 2 hours as much as tolerated.     Education Provided Skin check;Residual limb care;Prosthetic cleaning;Correct ply sock adjustment;Proper Donning;Proper Doffing;Proper wear schedule/adjustment;Proper weight-bearing schedule/adjustment    Person(s) Educated Patient    Education Method Explanation;Demonstration;Tactile cues;Verbal cues    Education Method Verbalized understanding;Verbal cues required;Needs further instruction                    PT Short Term Goals - 04/15/20 0945      PT SHORT TERM GOAL #1   Title Patient demonstrates proper donning & verbalizes general understanding of adjusting ply socks. (All STGs Target Date: 05/13/2020)    Time 4    Period Weeks    Status New    Target Date 05/13/20      PT SHORT TERM GOAL #2   Title Patient tolerates wear of prosthetic liner >8hrs /day and prosthesis >4 of those hours.    Time 4    Period Weeks    Status New    Target Date 05/13/20      PT SHORT TERM GOAL #3   Title Patient reports residual limb pain </=7/10 with wear & standing.    Time 4    Period Weeks    Status New    Target Date 05/13/20      PT SHORT TERM GOAL #4   Title Patient able to pick up items from floor without UE support.    Time 4    Period Weeks    Status New    Target Date 05/13/20      PT SHORT TERM GOAL #5   Title Patient ambulates 100' with cane & prosthesis with supervision.    Time 4    Period Weeks    Status New    Target Date 05/13/20             PT Long Term Goals - 04/14/20 1937      PT LONG TERM GOAL #1   Title Patient verbalizes & demonstrates understanding of proper prosthetic care to enable safe use of prosthesis. (All LTGs Target Date: 07/08/2020)    Time 12    Period Weeks    Status  New    Target Date 07/08/20      PT LONG TERM GOAL #2   Title Patient tolerates prosthesis wear >90% of awake hours without skin issues & residual limb pain </=4/10.    Time 12  Period Weeks    Status New    Target Date 07/08/20      PT LONG TERM GOAL #3   Title Berg Balance >45/56 indicating lower fall risk.    Time 12    Period Weeks    Status New    Target Date 07/08/20      PT LONG TERM GOAL #4   Title Patient ambulates >1000' outdoors including grass with cane or less & prosthesis modified independent.    Time 12    Period Weeks    Status New    Target Date 07/08/20      PT LONG TERM GOAL #5   Title Patient negotiates ramps, curbs & stairs with cane or less & prosthesis modified independent.    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    Time 12    Period Weeks    Status New    Target Date 07/08/20      PT LONG TERM GOAL #7   Title Patient reports above tasks increases his chronic back & knee pain <2 increments on 0-10 scale.    Time 12    Period Weeks    Status New    Target Date 07/08/20                  Plan - 04/15/20 0920    Clinical Impression Statement This 64yo male was injured >7yrs ago in fall fracturing both ankles & his back with multiple surgeries and limited mobility resulting in deconditioning. He underwent a right Transtibial Amputation on 01/29/2020 and received his prosthesis on 01/29/2020.  He is dependent in prosthetic care. He has worn his prosthesis each day 15 minutes 1-3 times per day. However he has residual limb pain up to 9/10 with wear & weight bearing on prosthesis which is relieved by removing the prosthesis. He has superficial wound & adherence of scar at distal tibia.  He also has  chronic pain in left knee & back limiting his mobility.  Berg Balance 26/56 indicates high fall risk. He is dependent on prosthetic gait requiring bilateral upper extremity on crutches with significant deviations and gait velocity of 0.98 ft/sec indicating high fall risk and limited mobility.  Patient would benefit from skilled PT to improve his function & mobility.    Personal Factors and Comorbidities Comorbidity 3+;Fitness;Past/Current Experience;Time since onset of injury/illness/exacerbation    Comorbidities Right TTA, Posterior Lateral Fusion T11-L3 09/2018, OA LBP & left knee, anxiety, depression    Examination-Activity Limitations Lift;Locomotion Level;Squat;Stairs;Stand;Transfers    Examination-Participation Restrictions Community Activity;Yard Work;Other   farm tasks   Stability/Clinical Decision Making Evolving/Moderate complexity    Clinical Decision Making Moderate    Rehab Potential Good    PT Frequency 2x / week    PT Duration 12 weeks    PT Treatment/Interventions ADLs/Self Care Home Management;DME Instruction;Moist Heat;Electrical Stimulation;Gait training;Stair training;Functional mobility training;Therapeutic activities;Therapeutic exercise;Balance training;Neuromuscular re-education;Patient/family education;Prosthetic Training;Manual techniques;Scar mobilization;Passive range of motion;Dry needling;Vestibular    PT Next Visit Plan review prosthetic care, Instruct in negotiating ramps, curbs & stairs with crutches, instruct in HEP at sink    Consulted and Agree with Plan of Care Patient  Patient will benefit from skilled therapeutic intervention in order to improve the following deficits and impairments:  Abnormal gait, Decreased activity tolerance, Decreased balance, Decreased endurance, Decreased knowledge of use of DME, Decreased mobility, Decreased range of motion, Decreased skin integrity, Decreased scar mobility, Decreased strength, Impaired flexibility, Postural  dysfunction, Prosthetic Dependency, Pain  Visit Diagnosis: Pain in right lower leg  Other abnormalities of gait and mobility  Unsteadiness on feet  Chronic pain of left knee  Chronic midline low back pain without sciatica  Stiffness of left knee, not elsewhere classified  Stiffness of right knee, not elsewhere classified  Muscle weakness (generalized)  History of falling     Problem List Patient Active Problem List   Diagnosis Date Noted  . Acute hematogenous osteomyelitis of right foot ([place]) 01/29/2020  . Post-traumatic arthritis of ankle, right   . Cutaneous abscess of right ankle   . Subacute osteomyelitis of right tibia ([place])   . Fatty liver 06/30/2019  . Calculus of gallbladder without cholecystitis without obstruction 06/30/2019  . Nausea without vomiting 05/13/2019  . Abdominal bloating 05/13/2019  . Wound infection following procedure 12/19/2018  . Malunion of joint fusion ([place]) 11/26/2018  . 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[facility])   . Status post lumbar spinal fusion 09/10/2018  . S/P ankle fusion 11/27/2017  . Nonunion of subtalar arthrodesis   . Avascular necrosis of talus, right ([place])   . Post-traumatic osteoarthritis, left ankle and foot 10/22/2017  . DDD (degenerative disc disease), lumbar 04/05/2017  . Fracture of L2 vertebra ([place]) 01/12/2015  . Acute osteomyelitis of calcaneum ([place]) 10/09/2014  . Dysuria 10/05/2014  . Cellulitis 10/05/2014  . Fall from ladder 09/21/2014  . L2 vertebral fracture ([place]) 09/21/2014  . Bilateral calcaneal fractures 09/21/2014  . Chronic pain 09/21/2014  . Acute blood loss anemia 09/21/2014  . Open fracture of both calcanei 09/15/2014    Jamey Reas PT, DPT 04/15/2020, 9:50 AM  [facility] [address] [place], [place], [address] Phone: [phone]   Fax:  [phone]  Name: Bradley Espinoza MRN: [id number] Date of Birth: 05/18/1956

## 2020-04-19 ENCOUNTER — Encounter: Payer: Self-pay | Admitting: Physical Therapy

## 2020-04-19 ENCOUNTER — Ambulatory Visit (INDEPENDENT_AMBULATORY_CARE_PROVIDER_SITE_OTHER): Payer: PPO | Admitting: Physical Therapy

## 2020-04-19 ENCOUNTER — Ambulatory Visit (HOSPITAL_COMMUNITY)
Admission: RE | Admit: 2020-04-19 | Discharge: 2020-04-19 | Disposition: A | Discharge status: Home / Self Care | Payer: PPO | Source: Ambulatory Visit | Attending: Gastroenterology | Admitting: Gastroenterology

## 2020-04-19 ENCOUNTER — Other Ambulatory Visit: Payer: Self-pay

## 2020-04-19 DIAGNOSIS — M25662 Stiffness of left knee, not elsewhere classified: Secondary | ICD-10-CM

## 2020-04-19 DIAGNOSIS — R2689 Other abnormalities of gait and mobility: Secondary | ICD-10-CM

## 2020-04-19 DIAGNOSIS — M545 Low back pain, unspecified: Secondary | ICD-10-CM

## 2020-04-19 DIAGNOSIS — M25661 Stiffness of right knee, not elsewhere classified: Secondary | ICD-10-CM

## 2020-04-19 DIAGNOSIS — R6881 Early satiety: Secondary | ICD-10-CM | POA: Diagnosis not present

## 2020-04-19 DIAGNOSIS — R634 Abnormal weight loss: Secondary | ICD-10-CM | POA: Diagnosis not present

## 2020-04-19 DIAGNOSIS — M6281 Muscle weakness (generalized): Secondary | ICD-10-CM

## 2020-04-19 DIAGNOSIS — R110 Nausea: Secondary | ICD-10-CM | POA: Diagnosis not present

## 2020-04-19 DIAGNOSIS — G8929 Other chronic pain: Secondary | ICD-10-CM

## 2020-04-19 DIAGNOSIS — R2681 Unsteadiness on feet: Secondary | ICD-10-CM | POA: Diagnosis not present

## 2020-04-19 DIAGNOSIS — N200 Calculus of kidney: Secondary | ICD-10-CM | POA: Diagnosis not present

## 2020-04-19 DIAGNOSIS — I700 Atherosclerosis of aorta: Secondary | ICD-10-CM | POA: Diagnosis not present

## 2020-04-19 DIAGNOSIS — M25562 Pain in left knee: Secondary | ICD-10-CM

## 2020-04-19 DIAGNOSIS — N281 Cyst of kidney, acquired: Secondary | ICD-10-CM | POA: Diagnosis not present

## 2020-04-19 DIAGNOSIS — M79661 Pain in right lower leg: Secondary | ICD-10-CM

## 2020-04-19 DIAGNOSIS — M4856XA Collapsed vertebra, not elsewhere classified, lumbar region, initial encounter for fracture: Secondary | ICD-10-CM | POA: Diagnosis not present

## 2020-04-19 LAB — POCT I-STAT CREATININE: Creatinine, Ser: 0.8 mg/dL (ref 0.61–1.24)

## 2020-04-19 MED ORDER — IOHEXOL 300 MG/ML  SOLN
100.0000 mL | Freq: Once | INTRAMUSCULAR | Status: AC | PRN
  Administered 2020-04-19: 100 mL via INTRAVENOUS

## 2020-04-19 NOTE — Patient Instructions (Signed)
Access Code: [email or web address] URL: [email or web address] Date: 04/19/2020 Prepared by: Jamey Reas  Exercises Seated Hamstring Stretch - 2 x daily - 7 x weekly - 3 reps - 1 sets - 30 seconds hold Seated Gastroc Stretch with Strap - 2 x daily - 7 x weekly - 3 reps - 1 sets - 30 seconds hold Seated Hamstring Stretch with Strap - 2 x daily - 7 x weekly - 3 reps - 1 sets - 30 seconds hold

## 2020-04-19 NOTE — Therapy (Signed)
[facility] Physical Therapy [address] [place], [place], [address] Phone: [phone]   Fax:  [phone]  Physical Therapy Treatment  Patient Details  Name: Bradley Espinoza MRN: [id number] Date of Birth: 11-Mar-1956 Referring Provider (PT): Meridee Score, MD   Encounter Date: 04/19/2020   PT End of Session - 04/19/20 1602    Visit Number 2    Number of Visits 25    Date for PT Re-Evaluation 07/13/20    Authorization Type [employer]    Authorization Time Period $15 co-pay    Progress Note Due on Visit 10    PT Start Time 1515    PT Stop Time 1600    PT Time Calculation (min) 45 min    Activity Tolerance Patient tolerated treatment well;Patient limited by pain    Behavior During Therapy [facility] for tasks assessed/performed           Past Medical History:  Diagnosis Date   Anxiety    Arthritis    low back pain, lumbar radiculopathy   Depression    Fatty liver    Fracture    B/L ankles   GERD (gastroesophageal reflux disease)    Headache(784.0)    allergy related    History of kidney stones    passed stones   History of stomach ulcers    Retained orthopedic hardware    failed retained hardware right foot   Wears glasses    Wears glasses    Wears partial dentures    upper    Past Surgical History:  Procedure Laterality Date   AMPUTATION Right 01/29/2020   Procedure: RIGHT BELOW KNEE AMPUTATION;  Surgeon: Newt Minion, MD;  Location: [place];  Service: Orthopedics;  Laterality: Right;   ANKLE FUSION Right 05/11/2015   Procedure: Right Posterior Arthroscopic Subtalar Arthrodesis;  Surgeon: Newt Minion, MD;  Location: [place];  Service: Orthopedics;  Laterality: Right;   ANKLE FUSION Right 11/27/2017   Procedure: RIGHT TIBIOCALCANEAL FUSION;  Surgeon: Newt Minion, MD;  Location: [place];  Service: Orthopedics;  Laterality: Right;   ANKLE FUSION Right 11/26/2018   Procedure: RIGHT ANTERIOR ANKLE FUSION, APPLY VAC;   Surgeon: Newt Minion, MD;  Location: [place];  Service: Orthopedics;  Laterality: Right;   ANKLE FUSION Right 09/09/2019   Procedure: REVISION RIGHT ANKLE FUSION;  Surgeon: Newt Minion, MD;  Location: [place];  Service: Orthopedics;  Laterality: Right;   ANTERIOR LAT LUMBAR FUSION N/A 04/05/2017   Procedure: Extreme Lateral Interbody Fusion - Lumbar three-lumbar four ,exploration of fusion Posterior augmentation with globus addition Removal hardware Lumbar one-three. Lumbar four-sacral one,  Removal internal bone growth stimulator;  Surgeon: Kary Kos, MD;  Location: [place];  Service: Neurosurgery;  Laterality: N/A;   APPLICATION OF WOUND VAC  12/23/2018   Procedure: Application Of Wound Vac;  Surgeon: Newt Minion, MD;  Location: [place];  Service: Orthopedics;;   BACK SURGERY  2004   x 2   BIOPSY  06/05/2019   Procedure: BIOPSY;  Surgeon: Rogene Houston, MD;  [facility];  Service: Endoscopy;;  gastric biopsy   CERVICAL SPINE SURGERY  2008   CHOLECYSTECTOMY N/A 07/06/2019   Procedure: LAPAROSCOPIC CHOLECYSTECTOMY;  Surgeon: Virl Cagey, MD;  [facility];  Service: General;  Laterality: N/A;   COLONOSCOPY WITH PROPOFOL N/A 06/05/2019   Procedure: COLONOSCOPY WITH PROPOFOL;  Surgeon: Rogene Houston, MD;  [facility];  Service: Endoscopy;  Laterality: N/A;  ESOPHAGOGASTRODUODENOSCOPY     ESOPHAGOGASTRODUODENOSCOPY (EGD) WITH PROPOFOL N/A 06/05/2019   Procedure: ESOPHAGOGASTRODUODENOSCOPY (EGD) WITH PROPOFOL;  Surgeon: Rogene Houston, MD;  [facility];  Service: Endoscopy;  Laterality: N/A;  1220pm   HARDWARE REMOVAL Right 10/09/2014   Procedure: Removal Deep Hardware, Irrigation and Debridement Calcaneus, Place Antibiotic Beads and Wound VAC ;  Surgeon: Newt Minion, MD;  Location: [place];  Service: Orthopedics;  Laterality: Right;   HARDWARE REMOVAL Right 08/12/2015   Procedure: Removal Deep Hardware Right Foot;  Surgeon: Newt Minion,  MD;  Location: [place];  Service: Orthopedics;  Laterality: Right;   HARDWARE REMOVAL Right 11/26/2018   Procedure: REMOVAL RIGHT TIBIOCALCANEAL NAIL;  Surgeon: Newt Minion, MD;  Location: [place];  Service: Orthopedics;  Laterality: Right;   HARDWARE REMOVAL Right 12/23/2018   Procedure: RIGHT ANKLE REMOVE HARDWARE, PLACE ANTIBIOTIC BEADS and placement of wound vac;  Surgeon: Newt Minion, MD;  Location: [place];  Service: Orthopedics;  Laterality: Right;   HARDWARE REMOVAL Right 09/29/2019   Procedure: RIGHT ANKLE REMOVE HARDWARE, DEBRIDEMENT;  Surgeon: Newt Minion, MD;  Location: [place];  Service: Orthopedics;  Laterality: Right;   I & D EXTREMITY Right 09/15/2014   Procedure: IRRIGATION AND DEBRIDEMENT Ankle;  Surgeon: Renette Butters, MD;  Location: [place];  Service: Orthopedics;  Laterality: Right;   INGUINAL HERNIA REPAIR Bilateral    LAMINECTOMY WITH POSTERIOR LATERAL ARTHRODESIS LEVEL 4 N/A 09/10/2018   Procedure: Posterior Lateral Fusion - Thoracic Eleven-Thoracic Twelve - Thoracic Twelve-Lumbar One - Lumbar One-Lumbar Two - Lumbar Two-Lumbar Three with instrumentaion and PLA;  Surgeon: Kary Kos, MD;  Location: [place];  Service: Neurosurgery;  Laterality: N/A;  Posterior Lateral Fusion - Thoracic Eleven-Thoracic Twelve - Thoracic Twelve-Lumbar One - Lumbar One-Lumbar Two - Lumbar Two-Lumbar Thre   LIVER BIOPSY N/A 07/06/2019   Procedure: LIVER BIOPSY;  Surgeon: Virl Cagey, MD;  [facility];  Service: General;  Laterality: N/A;   LUMBAR PERCUTANEOUS PEDICLE SCREW 1 LEVEL N/A 04/05/2017   Procedure: LUMBAR PERCUTANEOUS PEDICLE SCREW LUMBAR THREE-FOUR;  Surgeon: Kary Kos, MD;  Location: [place];  Service: Neurosurgery;  Laterality: N/A;   ORIF CALCANEOUS FRACTURE Right 09/19/2014   Procedure: OPEN REDUCTION INTERNAL FIXATION (ORIF) CALCANEOUS FRACTURE;  Surgeon: Newt Minion, MD;  Location: [place];  Service: Orthopedics;  Laterality: Right;   ORIF CALCANEOUS FRACTURE Left  09/19/2014   Procedure: OPEN REDUCTION INTERNAL FIXATION (ORIF) CALCANEOUS FRACTURE;  Surgeon: Newt Minion, MD;  Location: [place];  Service: Orthopedics;  Laterality: Left;   POLYPECTOMY  06/05/2019   Procedure: POLYPECTOMY;  Surgeon: Rogene Houston, MD;  [facility];  Service: Endoscopy;;  cecal polyp biopsy forcep, ascending polyp cold snare    There were no vitals filed for this visit.   Subjective Assessment - 04/19/20 1517    Subjective He has worn prsothesis up to 30 minutes 4-5 times per day. Limited by limb pain.    Pertinent History Right TTA, Posterior Lateral Fusion T11-L3 09/2018, OA LBP & left knee, anxiety, depression    Limitations Standing;Walking;House hold activities    Patient Stated Goals To return to work in [employer], use prosthesis to be active    Currently in Pain? Yes    Pain Score 8     Pain Location Leg   limb hurts 15-20 minutes after removes prosthesis then 0/10   Pain Orientation Right    Pain Descriptors / Indicators Squeezing;Sore  Pain Type Acute pain    Pain Onset 1 to 4 weeks ago    Pain Frequency Intermittent    Aggravating Factors  wearing prosthesis    Pain Relieving Factors removing prosthesis    Pain Onset More than a month ago    Pain Onset More than a month ago                             [facility] Adult PT Treatment/Exercise - 04/19/20 1515      Transfers   Transfers Sit to Stand;Stand to Sit    Sit to Stand 5: Supervision;With upper extremity assist;With armrests;From chair/3-in-1    Stand to Sit 5: Supervision;With upper extremity assist;With armrests;To chair/3-in-1      Ambulation/Gait   Ambulation/Gait Yes    Ambulation/Gait Assistance 5: Supervision    Ambulation/Gait Assistance Details PT demo & verbal cues on weight bearing directly over prosthesis in stance with slow focused gait.     Ambulation Distance (Feet) 100 Feet   100' X 3   Assistive device Crutches;Prosthesis    Gait Pattern  Step-through pattern;Decreased stance time - right;Decreased step length - left;Decreased hip/knee flexion - right;Decreased weight shift to right;Right hip hike;Right foot flat;Right flexed knee in stance;Left flexed knee in stance;Antalgic;Lateral hip instability;Trunk flexed;Abducted- right    Ambulation Surface Level;Indoor    Gait velocity --    Stairs Yes    Stairs Assistance 4: Min guard    Stair Management Technique Two rails;One rail Left;One rail Right;With crutches;Step to pattern;Forwards    Number of Stairs 11   4 w/2 rails, 4 w/rt rail & lt cructh, 3 w/lt rail & rt crutc   Height of Stairs 6    Curb 5: Supervision   axillary crutches & prosthesis   Curb Details (indicate cue type and reason) PT demo & verbal cues in technique with prosthesis      Knee/Hip Exercises: Stretches   Active Hamstring Stretch Left;3 reps;30 seconds    Active Hamstring Stretch Limitations seated with LLE extended    Gastroc Stretch Left;3 reps;30 seconds    Gastroc Stretch Limitations seated with strap    Other Knee/Hip Stretches combo hamstring & gastroc stretch 3 reps 30 seconds with LLE extended, strap to DF ankle then forward lean.       Knee/Hip Exercises: Aerobic   Nustep Level 5 with BUEs & BLEs 3 minutes with cues LEs 75% & UEs 25%.  Maintain LEs in line with hip & no rotation /abduction.       Knee/Hip Exercises: Machines for Strengthening   Cybex Leg Press BLEs 75# 15 reps with cues on proper LE motion.       Prosthetics   Prosthetic Care Comments  wear liner 2 hrs 3x/day and prosthesis as much as tolerable of ea 2 hours.     Current prosthetic wear tolerance (days/week)  daily    Current prosthetic wear tolerance (#hours/day)  15-30 minutes 3-5 x/day    Current prosthetic weight-bearing tolerance (hours/day)  Patient tolerates standing for 5 minutes with partial weight on prosthesis with limb pain 8-9/10.      Edema pitting edema    Residual limb condition  superficial opening at distal  tibia with no drainage,  scar adhered at distal tibia, good hair growth, normal color & moisture.      Education Provided Skin check;Residual limb care;Prosthetic cleaning;Correct ply sock adjustment;Proper Donning;Proper Doffing;Proper wear schedule/adjustment;Proper weight-bearing schedule/adjustment  Person(s) Educated Patient    Education Method Explanation;Verbal cues    Education Method Verbalized understanding;Needs further instruction    Donning Prosthesis Supervision    Doffing Prosthesis Modified independent (device/increased time)                  PT Education - 04/19/20 1601    Education Details HEP for hamstring / gastroc stretches  Medbridge Access Code: [email or web address]    Person(s) Educated Patient    Methods Explanation;Demonstration;Tactile cues;Verbal cues;Handout    Comprehension Verbalized understanding;Returned demonstration;Verbal cues required;Need further instruction            PT Short Term Goals - 04/19/20 1603      PT SHORT TERM GOAL #1   Title Patient demonstrates proper donning & verbalizes general understanding of adjusting ply socks. (All STGs Target Date: 05/13/2020)    Time 4    Period Weeks    Status On-going    Target Date 05/13/20      PT SHORT TERM GOAL #2   Title Patient tolerates wear of prosthetic liner >8hrs /day and prosthesis >4 of those hours.    Time 4    Period Weeks    Status On-going    Target Date 05/13/20      PT SHORT TERM GOAL #3   Title Patient reports residual limb pain </=7/10 with wear & standing.    Time 4    Period Weeks    Status On-going    Target Date 05/13/20      PT SHORT TERM GOAL #4   Title Patient able to pick up items from floor without UE support.    Time 4    Period Weeks    Status On-going    Target Date 05/13/20      PT SHORT TERM GOAL #5   Title Patient ambulates 100' with cane & prosthesis with supervision.    Time 4    Period Weeks    Status On-going    Target Date 05/13/20              PT Long Term Goals - 04/19/20 1604      PT LONG TERM GOAL #1   Title Patient verbalizes & demonstrates understanding of proper prosthetic care to enable safe use of prosthesis. (All LTGs Target Date: 07/08/2020)    Time 12    Period Weeks    Status On-going    Target Date 07/08/20      PT LONG TERM GOAL #2   Title Patient tolerates prosthesis wear >90% of awake hours without skin issues & residual limb pain </=4/10.    Time 12    Period Weeks    Status On-going    Target Date 07/08/20      PT LONG TERM GOAL #3   Title Berg Balance >45/56 indicating lower fall risk.    Time 12    Period Weeks    Status On-going    Target Date 07/08/20      PT LONG TERM GOAL #4   Title Patient ambulates >1000' outdoors including grass with cane or less & prosthesis modified independent.    Time 12    Period Weeks    Status On-going    Target Date 07/08/20      PT LONG TERM GOAL #5   Title Patient negotiates ramps, curbs & stairs with cane or less & prosthesis modified independent.    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    Time 12    Period Weeks    Status On-going    Target Date 07/08/20      PT LONG TERM GOAL #7   Title Patient reports above tasks increases his chronic back & knee pain <2 increments on 0-10 scale.    Time 12    Period Weeks    Status On-going    Target Date 07/08/20                 Plan - 04/19/20 1605    Clinical Impression Statement Pt started session with significant residual limb pain. PT session with progressive weight bearing activities that patient improved pain with prosthesis use.  PT instructed in hamstring & gastroc stretch for LLE and he seems to have a general understanding.    Personal Factors and Comorbidities Comorbidity 3+;Fitness;Past/Current  Experience;Time since onset of injury/illness/exacerbation    Comorbidities Right TTA, Posterior Lateral Fusion T11-L3 09/2018, OA LBP & left knee, anxiety, depression    Examination-Activity Limitations Lift;Locomotion Level;Squat;Stairs;Stand;Transfers    Examination-Participation Restrictions Community Activity;Yard Work;Other   farm tasks   Stability/Clinical Decision Making Evolving/Moderate complexity    Rehab Potential Good    PT Frequency 2x / week    PT Duration 12 weeks    PT Treatment/Interventions ADLs/Self Care Home Management;DME Instruction;Moist Heat;Electrical Stimulation;Gait training;Stair training;Functional mobility training;Therapeutic activities;Therapeutic exercise;Balance training;Neuromuscular re-education;Patient/family education;Prosthetic Training;Manual techniques;Scar mobilization;Passive range of motion;Dry needling;Vestibular    PT Next Visit Plan review prosthetic care, Instruct in negotiating ramps, review curbs & stairs with crutches, instruct in HEP at sink    Consulted and Agree with Plan of Care Patient           Patient will benefit from skilled therapeutic intervention in order to improve the following deficits and impairments:  Abnormal gait, Decreased activity tolerance, Decreased balance, Decreased endurance, Decreased knowledge of use of DME, Decreased mobility, Decreased range of motion, Decreased skin integrity, Decreased scar mobility, Decreased strength, Impaired flexibility, Postural dysfunction, Prosthetic Dependency, Pain  Visit Diagnosis: Other abnormalities of gait and mobility  Unsteadiness on feet  Pain in right lower leg  Chronic pain of left knee  Chronic midline low back pain without sciatica  Stiffness of left knee, not elsewhere classified  Stiffness of right knee, not elsewhere classified  Muscle weakness (generalized)     Problem List Patient Active Problem List   Diagnosis Date Noted   Acute hematogenous  osteomyelitis of right foot ([place]) 01/29/2020   Post-traumatic arthritis of ankle, right    Cutaneous abscess of right ankle    Subacute osteomyelitis of right tibia ([facility])    Fatty liver 06/30/2019   Calculus of gallbladder without cholecystitis without obstruction 06/30/2019   Nausea without vomiting 05/13/2019   Abdominal bloating 05/13/2019   Wound infection following procedure 12/19/2018   Malunion of joint fusion ([place]) 11/26/2018   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[facility])    Status post lumbar spinal fusion 09/10/2018   S/P ankle fusion 11/27/2017   Nonunion of subtalar arthrodesis    Avascular necrosis of talus, right ([facility])    Post-traumatic osteoarthritis, left ankle and foot 10/22/2017   DDD (degenerative disc disease), lumbar 04/05/2017   Fracture of L2 vertebra ([place]) 01/12/2015   Acute osteomyelitis of calcaneum ([place]) 10/09/2014   Dysuria 10/05/2014   Cellulitis 10/05/2014  Fall from ladder 09/21/2014   L2 vertebral fracture ([place]) 09/21/2014   Bilateral calcaneal fractures 09/21/2014   Chronic pain 09/21/2014   Acute blood loss anemia 09/21/2014   Open fracture of both calcanei 09/15/2014    Jamey Reas PT,DPT 04/19/2020, 4:23 PM  [facility] Physical Therapy [address] [place], [place], [address] Phone: [phone]   Fax:  [phone]  Name: Bradley Espinoza MRN: [id number] Date of Birth: [birth year]

## 2020-04-21 ENCOUNTER — Encounter: Payer: Self-pay | Admitting: Physical Therapy

## 2020-04-21 ENCOUNTER — Other Ambulatory Visit: Payer: Self-pay

## 2020-04-21 ENCOUNTER — Ambulatory Visit: Payer: PPO | Admitting: Physical Therapy

## 2020-04-21 DIAGNOSIS — M6281 Muscle weakness (generalized): Secondary | ICD-10-CM | POA: Diagnosis not present

## 2020-04-21 DIAGNOSIS — R2689 Other abnormalities of gait and mobility: Secondary | ICD-10-CM | POA: Diagnosis not present

## 2020-04-21 DIAGNOSIS — M545 Low back pain: Secondary | ICD-10-CM | POA: Diagnosis not present

## 2020-04-21 DIAGNOSIS — M79661 Pain in right lower leg: Secondary | ICD-10-CM | POA: Diagnosis not present

## 2020-04-21 DIAGNOSIS — M25562 Pain in left knee: Secondary | ICD-10-CM

## 2020-04-21 DIAGNOSIS — M25662 Stiffness of left knee, not elsewhere classified: Secondary | ICD-10-CM

## 2020-04-21 DIAGNOSIS — Z9181 History of falling: Secondary | ICD-10-CM | POA: Diagnosis not present

## 2020-04-21 DIAGNOSIS — M25661 Stiffness of right knee, not elsewhere classified: Secondary | ICD-10-CM | POA: Diagnosis not present

## 2020-04-21 DIAGNOSIS — R2681 Unsteadiness on feet: Secondary | ICD-10-CM | POA: Diagnosis not present

## 2020-04-21 DIAGNOSIS — G8929 Other chronic pain: Secondary | ICD-10-CM

## 2020-04-21 NOTE — Patient Instructions (Signed)
[employer] Socks:  1-ply is thin no color at top,  3-ply is yellow at top,  5-ply is green at top How many ply you need depends on your limb size.  You should have even pressure on your limb when standing & walking.  Guidance points: 1. How ease it goes on? Should be some resistance. Too few it goes on too easily. Too many it takes a lot of work to get it on. 2. How many clicks you get. Especially clicks in sitting. 3. After standing or walking, check knee cap. Bottom should be just under the front lip.  Too few bottom of knee cap sits on indention. Too many bottom is above front lip. 4. Have your feet beside each other & hips over feet. Place hands on your waist. Pelvis Should be level. Too few prosthetic side will be low. Too many prosthetic side will be high.    Get ply socks correct before you leave the house. Take extra socks with you. Take one 3-ply and two 1-ply with you. This is in addition to what you are wearing.

## 2020-04-21 NOTE — Therapy (Signed)
[facility] Physical Therapy [address] [place], [place], [address] Phone: [phone]   Fax:  [phone]  Physical Therapy Treatment  Patient Details  Name: Bradley Espinoza MRN: [id number] Date of Birth: 1956-06-30 Referring Provider (PT): Meridee Score, MD   Encounter Date: 04/21/2020   PT End of Session - 04/21/20 0923    Visit Number 3    Number of Visits 25    Date for PT Re-Evaluation 07/13/20    Authorization Type [employer]    Authorization Time Period $15 co-pay    Progress Note Due on Visit 10    PT Start Time 0925    PT Stop Time 1018    PT Time Calculation (min) 53 min    Activity Tolerance Patient tolerated treatment well;Patient limited by pain    Behavior During Therapy [facility] for tasks assessed/performed           Past Medical History:  Diagnosis Date  . Anxiety   . Arthritis    low back pain, lumbar radiculopathy  . Depression   . Fatty liver   . Fracture    B/L ankles  . GERD (gastroesophageal reflux disease)   . Headache(784.0)    allergy related   . History of kidney stones    passed stones  . History of stomach ulcers   . Retained orthopedic hardware    failed retained hardware right foot  . Wears glasses   . Wears glasses   . Wears partial dentures    upper    Past Surgical History:  Procedure Laterality Date  . AMPUTATION Right 01/29/2020   Procedure: RIGHT BELOW KNEE AMPUTATION;  Surgeon: Newt Minion, MD;  Location: [place];  Service: Orthopedics;  Laterality: Right;  . ANKLE FUSION Right 05/11/2015   Procedure: Right Posterior Arthroscopic Subtalar Arthrodesis;  Surgeon: Newt Minion, MD;  Location: [place];  Service: Orthopedics;  Laterality: Right;  . ANKLE FUSION Right 11/27/2017   Procedure: RIGHT TIBIOCALCANEAL FUSION;  Surgeon: Newt Minion, MD;  Location: [place];  Service: Orthopedics;  Laterality: Right;  . ANKLE FUSION Right 11/26/2018   Procedure: RIGHT ANTERIOR ANKLE FUSION, APPLY VAC;   Surgeon: Newt Minion, MD;  Location: [place];  Service: Orthopedics;  Laterality: Right;  . ANKLE FUSION Right 09/09/2019   Procedure: REVISION RIGHT ANKLE FUSION;  Surgeon: Newt Minion, MD;  Location: [place];  Service: Orthopedics;  Laterality: Right;  . ANTERIOR LAT LUMBAR FUSION N/A 04/05/2017   Procedure: Extreme Lateral Interbody Fusion - Lumbar three-lumbar four ,exploration of fusion Posterior augmentation with globus addition Removal hardware Lumbar one-three. Lumbar four-sacral one,  Removal internal bone growth stimulator;  Surgeon: Kary Kos, MD;  Location: [place];  Service: Neurosurgery;  Laterality: N/A;  . APPLICATION OF WOUND VAC  12/23/2018   Procedure: Application Of Wound Vac;  Surgeon: Newt Minion, MD;  Location: [place];  Service: Orthopedics;;  . BACK SURGERY  2004   x 2  . BIOPSY  06/05/2019   Procedure: BIOPSY;  Surgeon: Rogene Houston, MD;  [facility];  Service: Endoscopy;;  gastric biopsy  . CERVICAL SPINE SURGERY  2008  . CHOLECYSTECTOMY N/A 07/06/2019   Procedure: LAPAROSCOPIC CHOLECYSTECTOMY;  Surgeon: Virl Cagey, MD;  [facility];  Service: General;  Laterality: N/A;  . COLONOSCOPY WITH PROPOFOL N/A 06/05/2019   Procedure: COLONOSCOPY WITH PROPOFOL;  Surgeon: Rogene Houston, MD;  [facility];  Service: Endoscopy;  Laterality: N/A;  .  ESOPHAGOGASTRODUODENOSCOPY    . ESOPHAGOGASTRODUODENOSCOPY (EGD) WITH PROPOFOL N/A 06/05/2019   Procedure: ESOPHAGOGASTRODUODENOSCOPY (EGD) WITH PROPOFOL;  Surgeon: Rogene Houston, MD;  [facility];  Service: Endoscopy;  Laterality: N/A;  1220pm  . HARDWARE REMOVAL Right 10/09/2014   Procedure: Removal Deep Hardware, Irrigation and Debridement Calcaneus, Place Antibiotic Beads and Wound VAC ;  Surgeon: Newt Minion, MD;  Location: [place];  Service: Orthopedics;  Laterality: Right;  . HARDWARE REMOVAL Right 08/12/2015   Procedure: Removal Deep Hardware Right Foot;  Surgeon: Newt Minion,  MD;  Location: [place];  Service: Orthopedics;  Laterality: Right;  . HARDWARE REMOVAL Right 11/26/2018   Procedure: REMOVAL RIGHT TIBIOCALCANEAL NAIL;  Surgeon: Newt Minion, MD;  Location: [place];  Service: Orthopedics;  Laterality: Right;  . HARDWARE REMOVAL Right 12/23/2018   Procedure: RIGHT ANKLE REMOVE HARDWARE, PLACE ANTIBIOTIC BEADS and placement of wound vac;  Surgeon: Newt Minion, MD;  Location: [place];  Service: Orthopedics;  Laterality: Right;  . HARDWARE REMOVAL Right 09/29/2019   Procedure: RIGHT ANKLE REMOVE HARDWARE, DEBRIDEMENT;  Surgeon: Newt Minion, MD;  Location: [place];  Service: Orthopedics;  Laterality: Right;  . I & D EXTREMITY Right 09/15/2014   Procedure: IRRIGATION AND DEBRIDEMENT Ankle;  Surgeon: Renette Butters, MD;  Location: [place];  Service: Orthopedics;  Laterality: Right;  . INGUINAL HERNIA REPAIR Bilateral   . LAMINECTOMY WITH POSTERIOR LATERAL ARTHRODESIS LEVEL 4 N/A 09/10/2018   Procedure: Posterior Lateral Fusion - Thoracic Eleven-Thoracic Twelve - Thoracic Twelve-Lumbar One - Lumbar One-Lumbar Two - Lumbar Two-Lumbar Three with instrumentaion and PLA;  Surgeon: Kary Kos, MD;  Location: [place];  Service: Neurosurgery;  Laterality: N/A;  Posterior Lateral Fusion - Thoracic Eleven-Thoracic Twelve - Thoracic Twelve-Lumbar One - Lumbar One-Lumbar Two - Lumbar Two-Lumbar Thre  . LIVER BIOPSY N/A 07/06/2019   Procedure: LIVER BIOPSY;  Surgeon: Virl Cagey, MD;  [facility]: [facility];  Service: General;  Laterality: N/A;  . LUMBAR PERCUTANEOUS PEDICLE SCREW 1 LEVEL N/A 04/05/2017   Procedure: LUMBAR PERCUTANEOUS PEDICLE SCREW LUMBAR THREE-FOUR;  Surgeon: Kary Kos, MD;  Location: [place];  Service: Neurosurgery;  Laterality: N/A;  . ORIF CALCANEOUS FRACTURE Right 09/19/2014   Procedure: OPEN REDUCTION INTERNAL FIXATION (ORIF) CALCANEOUS FRACTURE;  Surgeon: Newt Minion, MD;  Location: [place];  Service: Orthopedics;  Laterality: Right;  . ORIF CALCANEOUS FRACTURE Left  09/19/2014   Procedure: OPEN REDUCTION INTERNAL FIXATION (ORIF) CALCANEOUS FRACTURE;  Surgeon: Newt Minion, MD;  Location: [place];  Service: Orthopedics;  Laterality: Left;  . POLYPECTOMY  06/05/2019   Procedure: POLYPECTOMY;  Surgeon: Rogene Houston, MD;  [facility];  Service: Endoscopy;;  cecal polyp biopsy forcep, ascending polyp cold snare    There were no vitals filed for this visit.   Subjective Assessment - 04/21/20 0924    Subjective He has been slowing walking to put weight thru prosthesis as PT indicated. Pain is better but still there.    Pertinent History Right TTA, Posterior Lateral Fusion T11-L3 09/2018, OA LBP & left knee, anxiety, depression    Limitations Standing;Walking;House hold activities    Patient Stated Goals To return to work in [employer], use prosthesis to be active    Currently in Pain? Yes    Pain Score 7     Pain Location Leg   residual limb   Pain Orientation Right;Anterior    Pain Descriptors / Indicators Sharp;Sore    Pain Onset 1  to 4 weeks ago    Aggravating Factors  first puts prosthesis on limb hurts 9/10    Pain Relieving Factors Once moves and walks on it 6/10    Pain Score 9    Pain Location Back    Pain Orientation Left;Lower    Pain Descriptors / Indicators Aching;Sore    Pain Type Chronic pain    Pain Onset More than a month ago    Aggravating Factors  when first gets up    Pain Relieving Factors if sleeps on couch with legs elevated, less pain in mornings.    Pain Onset More than a month ago                             [facility] Adult PT Treatment/Exercise - 04/21/20 0925      Transfers   Transfers Sit to Stand;Stand to Sit    Sit to Stand 5: Supervision;With upper extremity assist;With armrests;From chair/3-in-1    Stand to Sit 5: Supervision;With upper extremity assist;With armrests;To chair/3-in-1      Ambulation/Gait   Ambulation/Gait Yes    Ambulation/Gait Assistance 5: Supervision     Ambulation/Gait Assistance Details demo, verbal & tactile cues on proper step width & length.  Used line for visual feedback then proprioception.     Ambulation Distance (Feet) 200 Feet   50' X 3 & 150' X 2   Assistive device Crutches;Prosthesis    Gait Pattern Step-through pattern;Decreased stance time - right;Decreased step length - left;Decreased hip/knee flexion - right;Decreased weight shift to right;Right hip hike;Right foot flat;Right flexed knee in stance;Left flexed knee in stance;Antalgic;Lateral hip instability;Trunk flexed;Abducted- right    Ambulation Surface Level;Indoor    Stairs --    [employer] --    Stair Management Technique --    Number of Stairs --    Height of Stairs --    Curb --      Knee/Hip Exercises: Diplomatic Services operational officer --    Active Hamstring Stretch Limitations --    Production manager Limitations --    Other Knee/Hip Stretches --      Knee/Hip Exercises: Aerobic   Nustep --      Knee/Hip Exercises: Machines for Strengthening   Cybex Leg Press --      Prosthetics   Prosthetic Care Comments  Instructed in adjusting ply socks with too few (2-ply), too many (8-ply) & correct for current limb size (6-ply).  PT instructed can change 1-2 ply after arising & pumping some fluid out of limb each morning and change over time starting with increased ply socks.    Current prosthetic wear tolerance (days/week)  daily    Current prosthetic wear tolerance (#hours/day)  2-3 hours 2x/day liner with intermittent prosthesis 15-30 minutes.   PT recommended increasing to 3 hrs 3x/day with off 2 hour between wear. Prosthesis can be removed if becomes uncomfortable.     Current prosthetic weight-bearing tolerance (hours/day)  Patient tolerates standing for 5 minutes with partial weight on prosthesis with limb pain 8-9/10.      Edema pitting edema    Residual limb condition  superficial opening at distal tibia with no drainage,  scar  adhered at distal tibia, good hair growth, normal color & moisture.      Education Provided Skin check;Residual limb care;Prosthetic cleaning;Correct ply sock adjustment;Proper Donning;Proper Doffing;Proper wear schedule/adjustment;Proper weight-bearing schedule/adjustment;Other (comment)   see prosthetic care  comments   Person(s) Educated Patient    Education Method Explanation;Demonstration;Tactile cues;Verbal cues;Handout    Education Method Verbalized understanding;Returned demonstration;Tactile cues required;Verbal cues required;Needs further instruction    Donning Prosthesis Supervision    Doffing Prosthesis Modified independent (device/increased time)                    PT Short Term Goals - 04/19/20 1603      PT SHORT TERM GOAL #1   Title Patient demonstrates proper donning & verbalizes general understanding of adjusting ply socks. (All STGs Target Date: 05/13/2020)    Time 4    Period Weeks    Status On-going    Target Date 05/13/20      PT SHORT TERM GOAL #2   Title Patient tolerates wear of prosthetic liner >8hrs /day and prosthesis >4 of those hours.    Time 4    Period Weeks    Status On-going    Target Date 05/13/20      PT SHORT TERM GOAL #3   Title Patient reports residual limb pain </=7/10 with wear & standing.    Time 4    Period Weeks    Status On-going    Target Date 05/13/20      PT SHORT TERM GOAL #4   Title Patient able to pick up items from floor without UE support.    Time 4    Period Weeks    Status On-going    Target Date 05/13/20      PT SHORT TERM GOAL #5   Title Patient ambulates 100' with cane & prosthesis with supervision.    Time 4    Period Weeks    Status On-going    Target Date 05/13/20             PT Long Term Goals - 04/19/20 1604      PT LONG TERM GOAL #1   Title Patient verbalizes & demonstrates understanding of proper prosthetic care to enable safe use of prosthesis. (All LTGs Target Date: 07/08/2020)    Time 12      Period Weeks    Status On-going    Target Date 07/08/20      PT LONG TERM GOAL #2   Title Patient tolerates prosthesis wear >90% of awake hours without skin issues & residual limb pain </=4/10.    Time 12    Period Weeks    Status On-going    Target Date 07/08/20      PT LONG TERM GOAL #3   Title Berg Balance >45/56 indicating lower fall risk.    Time 12    Period Weeks    Status On-going    Target Date 07/08/20      PT LONG TERM GOAL #4   Title Patient ambulates >1000' outdoors including grass with cane or less & prosthesis modified independent.    Time 12    Period Weeks    Status On-going    Target Date 07/08/20      PT LONG TERM GOAL #5   Title Patient negotiates ramps, curbs & stairs with cane or less & prosthesis modified independent.    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    Time 12  Period Weeks    Status On-going    Target Date 07/08/20      PT LONG TERM GOAL #7   Title Patient reports above tasks increases his chronic back & knee pain <2 increments on 0-10 scale.    Time 12    Period Weeks    Status On-going    Target Date 07/08/20                 Plan - 04/21/20 4287    Clinical Impression Statement PT instructed in adjusting ply socks with too many, too few & proper ply socks. He appears to have better understanding and less pain.  PT also instructed in prosthetic gait with equal step length & width with crutches with partial weight tolerated on prosthesis.    Personal Factors and Comorbidities Comorbidity 3+;Fitness;Past/Current Experience;Time since onset of injury/illness/exacerbation    Comorbidities Right TTA, Posterior Lateral Fusion T11-L3 09/2018, OA LBP & left knee, anxiety, depression    Examination-Activity Limitations Lift;Locomotion  Level;Squat;Stairs;Stand;Transfers    Examination-Participation Restrictions Community Activity;Yard Work;Other   farm tasks   Stability/Clinical Decision Making Evolving/Moderate complexity    Rehab Potential Good    PT Frequency 2x / week    PT Duration 12 weeks    PT Treatment/Interventions ADLs/Self Care Home Management;DME Instruction;Moist Heat;Electrical Stimulation;Gait training;Stair training;Functional mobility training;Therapeutic activities;Therapeutic exercise;Balance training;Neuromuscular re-education;Patient/family education;Prosthetic Training;Manual techniques;Scar mobilization;Passive range of motion;Dry needling;Vestibular    PT Next Visit Plan review prosthetic care, Instruct in negotiating ramps, review curbs & stairs with crutches, instruct in HEP at sink    Consulted and Agree with Plan of Care Patient           Patient will benefit from skilled therapeutic intervention in order to improve the following deficits and impairments:  Abnormal gait, Decreased activity tolerance, Decreased balance, Decreased endurance, Decreased knowledge of use of DME, Decreased mobility, Decreased range of motion, Decreased skin integrity, Decreased scar mobility, Decreased strength, Impaired flexibility, Postural dysfunction, Prosthetic Dependency, Pain  Visit Diagnosis: Other abnormalities of gait and mobility  Unsteadiness on feet  Pain in right lower leg  Chronic pain of left knee  Chronic midline low back pain without sciatica  Stiffness of left knee, not elsewhere classified  Stiffness of right knee, not elsewhere classified  Muscle weakness (generalized)  History of falling     Problem List Patient Active Problem List   Diagnosis Date Noted  . Acute hematogenous osteomyelitis of right foot ([place]) 01/29/2020  . Post-traumatic arthritis of ankle, right   . Cutaneous abscess of right ankle   . Subacute osteomyelitis of right tibia ([place])   . Fatty liver 06/30/2019    . Calculus of gallbladder without cholecystitis without obstruction 06/30/2019  . Nausea without vomiting 05/13/2019  . Abdominal bloating 05/13/2019  . Wound infection following procedure 12/19/2018  . Malunion of joint fusion ([place]) 11/26/2018  . 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[facility])   . Status post lumbar spinal fusion 09/10/2018  . S/P ankle fusion 11/27/2017  . Nonunion of subtalar arthrodesis   . Avascular necrosis of talus, right ([place])   . Post-traumatic osteoarthritis, left ankle and foot 10/22/2017  . DDD (degenerative disc disease), lumbar 04/05/2017  . Fracture of L2 vertebra ([place]) 01/12/2015  . Acute osteomyelitis of calcaneum ([place]) 10/09/2014  . Dysuria 10/05/2014  . Cellulitis 10/05/2014  . Fall from ladder 09/21/2014  . L2 vertebral fracture ([place]) 09/21/2014  . Bilateral calcaneal fractures 09/21/2014  . Chronic pain 09/21/2014  . Acute blood loss anemia 09/21/2014  .  Open fracture of both calcanei 09/15/2014    Jamey Reas PT, DPT 04/21/2020, 1:41 PM  [facility] [address] [place], [place], [address] Phone: [phone]   Fax:  [phone]  Name: Bradley Espinoza MRN: [id number] Date of Birth: October 26, 1955

## 2020-04-26 ENCOUNTER — Ambulatory Visit (INDEPENDENT_AMBULATORY_CARE_PROVIDER_SITE_OTHER): Payer: PPO | Admitting: Physical Therapy

## 2020-04-26 ENCOUNTER — Encounter: Payer: Self-pay | Admitting: Physical Therapy

## 2020-04-26 ENCOUNTER — Other Ambulatory Visit: Payer: Self-pay

## 2020-04-26 DIAGNOSIS — R2689 Other abnormalities of gait and mobility: Secondary | ICD-10-CM

## 2020-04-26 DIAGNOSIS — M25562 Pain in left knee: Secondary | ICD-10-CM | POA: Diagnosis not present

## 2020-04-26 DIAGNOSIS — G8929 Other chronic pain: Secondary | ICD-10-CM

## 2020-04-26 DIAGNOSIS — M25662 Stiffness of left knee, not elsewhere classified: Secondary | ICD-10-CM

## 2020-04-26 DIAGNOSIS — M79661 Pain in right lower leg: Secondary | ICD-10-CM

## 2020-04-26 DIAGNOSIS — M6281 Muscle weakness (generalized): Secondary | ICD-10-CM

## 2020-04-26 DIAGNOSIS — M25661 Stiffness of right knee, not elsewhere classified: Secondary | ICD-10-CM

## 2020-04-26 DIAGNOSIS — R2681 Unsteadiness on feet: Secondary | ICD-10-CM

## 2020-04-26 DIAGNOSIS — M545 Low back pain: Secondary | ICD-10-CM

## 2020-04-26 NOTE — Therapy (Signed)
[facility] Physical Therapy [address] [place], [place], [address] Phone: [phone]   Fax:  [phone]  Physical Therapy Treatment  Patient Details  Name: Bradley Espinoza MRN: [id number] Date of Birth: 04-17-1956 Referring Provider (PT): Meridee Score, MD   Encounter Date: 04/26/2020   PT End of Session - 04/26/20 1104    Visit Number 4    Number of Visits 25    Date for PT Re-Evaluation 07/13/20    Authorization Type [employer]    Authorization Time Period $15 co-pay    Progress Note Due on Visit 10    PT Start Time 1014    PT Stop Time 1100    PT Time Calculation (min) 46 min    Activity Tolerance Patient tolerated treatment well;Patient limited by pain    Behavior During Therapy [facility] for tasks assessed/performed           Past Medical History:  Diagnosis Date   Anxiety    Arthritis    low back pain, lumbar radiculopathy   Depression    Fatty liver    Fracture    B/L ankles   GERD (gastroesophageal reflux disease)    Headache(784.0)    allergy related    History of kidney stones    passed stones   History of stomach ulcers    Retained orthopedic hardware    failed retained hardware right foot   Wears glasses    Wears glasses    Wears partial dentures    upper    Past Surgical History:  Procedure Laterality Date   AMPUTATION Right 01/29/2020   Procedure: RIGHT BELOW KNEE AMPUTATION;  Surgeon: Newt Minion, MD;  Location: [place];  Service: Orthopedics;  Laterality: Right;   ANKLE FUSION Right 05/11/2015   Procedure: Right Posterior Arthroscopic Subtalar Arthrodesis;  Surgeon: Newt Minion, MD;  Location: [place];  Service: Orthopedics;  Laterality: Right;   ANKLE FUSION Right 11/27/2017   Procedure: RIGHT TIBIOCALCANEAL FUSION;  Surgeon: Newt Minion, MD;  Location: [place];  Service: Orthopedics;  Laterality: Right;   ANKLE FUSION Right 11/26/2018   Procedure: RIGHT ANTERIOR ANKLE FUSION, APPLY VAC;   Surgeon: Newt Minion, MD;  Location: [place];  Service: Orthopedics;  Laterality: Right;   ANKLE FUSION Right 09/09/2019   Procedure: REVISION RIGHT ANKLE FUSION;  Surgeon: Newt Minion, MD;  Location: [place];  Service: Orthopedics;  Laterality: Right;   ANTERIOR LAT LUMBAR FUSION N/A 04/05/2017   Procedure: Extreme Lateral Interbody Fusion - Lumbar three-lumbar four ,exploration of fusion Posterior augmentation with globus addition Removal hardware Lumbar one-three. Lumbar four-sacral one,  Removal internal bone growth stimulator;  Surgeon: Kary Kos, MD;  Location: [employer];  Service: Neurosurgery;  Laterality: N/A;   APPLICATION OF WOUND VAC  12/23/2018   Procedure: Application Of Wound Vac;  Surgeon: Newt Minion, MD;  Location: [place];  Service: Orthopedics;;   BACK SURGERY  2004   x 2   BIOPSY  06/05/2019   Procedure: BIOPSY;  Surgeon: Rogene Houston, MD;  [facility];  Service: Endoscopy;;  gastric biopsy   CERVICAL SPINE SURGERY  2008   CHOLECYSTECTOMY N/A 07/06/2019   Procedure: LAPAROSCOPIC CHOLECYSTECTOMY;  Surgeon: Virl Cagey, MD;  [facility]: [facility];  Service: General;  Laterality: N/A;   COLONOSCOPY WITH PROPOFOL N/A 06/05/2019   Procedure: COLONOSCOPY WITH PROPOFOL;  Surgeon: Rogene Houston, MD;  [facility];  Service: Endoscopy;  Laterality: N/A;  ESOPHAGOGASTRODUODENOSCOPY     ESOPHAGOGASTRODUODENOSCOPY (EGD) WITH PROPOFOL N/A 06/05/2019   Procedure: ESOPHAGOGASTRODUODENOSCOPY (EGD) WITH PROPOFOL;  Surgeon: Rogene Houston, MD;  [facility];  Service: Endoscopy;  Laterality: N/A;  1220pm   HARDWARE REMOVAL Right 10/09/2014   Procedure: Removal Deep Hardware, Irrigation and Debridement Calcaneus, Place Antibiotic Beads and Wound VAC ;  Surgeon: Newt Minion, MD;  Location: [place];  Service: Orthopedics;  Laterality: Right;   HARDWARE REMOVAL Right 08/12/2015   Procedure: Removal Deep Hardware Right Foot;  Surgeon: Newt Minion,  MD;  Location: [place];  Service: Orthopedics;  Laterality: Right;   HARDWARE REMOVAL Right 11/26/2018   Procedure: REMOVAL RIGHT TIBIOCALCANEAL NAIL;  Surgeon: Newt Minion, MD;  Location: [place];  Service: Orthopedics;  Laterality: Right;   HARDWARE REMOVAL Right 12/23/2018   Procedure: RIGHT ANKLE REMOVE HARDWARE, PLACE ANTIBIOTIC BEADS and placement of wound vac;  Surgeon: Newt Minion, MD;  Location: [place];  Service: Orthopedics;  Laterality: Right;   HARDWARE REMOVAL Right 09/29/2019   Procedure: RIGHT ANKLE REMOVE HARDWARE, DEBRIDEMENT;  Surgeon: Newt Minion, MD;  Location: [place];  Service: Orthopedics;  Laterality: Right;   I & D EXTREMITY Right 09/15/2014   Procedure: IRRIGATION AND DEBRIDEMENT Ankle;  Surgeon: Renette Butters, MD;  Location: [place];  Service: Orthopedics;  Laterality: Right;   INGUINAL HERNIA REPAIR Bilateral    LAMINECTOMY WITH POSTERIOR LATERAL ARTHRODESIS LEVEL 4 N/A 09/10/2018   Procedure: Posterior Lateral Fusion - Thoracic Eleven-Thoracic Twelve - Thoracic Twelve-Lumbar One - Lumbar One-Lumbar Two - Lumbar Two-Lumbar Three with instrumentaion and PLA;  Surgeon: Kary Kos, MD;  Location: [place];  Service: Neurosurgery;  Laterality: N/A;  Posterior Lateral Fusion - Thoracic Eleven-Thoracic Twelve - Thoracic Twelve-Lumbar One - Lumbar One-Lumbar Two - Lumbar Two-Lumbar Thre   LIVER BIOPSY N/A 07/06/2019   Procedure: LIVER BIOPSY;  Surgeon: Virl Cagey, MD;  [facility]: [facility];  Service: General;  Laterality: N/A;   LUMBAR PERCUTANEOUS PEDICLE SCREW 1 LEVEL N/A 04/05/2017   Procedure: LUMBAR PERCUTANEOUS PEDICLE SCREW LUMBAR THREE-FOUR;  Surgeon: Kary Kos, MD;  Location: [place];  Service: Neurosurgery;  Laterality: N/A;   ORIF CALCANEOUS FRACTURE Right 09/19/2014   Procedure: OPEN REDUCTION INTERNAL FIXATION (ORIF) CALCANEOUS FRACTURE;  Surgeon: Newt Minion, MD;  Location: [place];  Service: Orthopedics;  Laterality: Right;   ORIF CALCANEOUS FRACTURE Left  09/19/2014   Procedure: OPEN REDUCTION INTERNAL FIXATION (ORIF) CALCANEOUS FRACTURE;  Surgeon: Newt Minion, MD;  Location: [place];  Service: Orthopedics;  Laterality: Left;   POLYPECTOMY  06/05/2019   Procedure: POLYPECTOMY;  Surgeon: Rogene Houston, MD;  [facility];  Service: Endoscopy;;  cecal polyp biopsy forcep, ascending polyp cold snare    There were no vitals filed for this visit.   Subjective Assessment - 04/26/20 1014    Subjective He has been working on adjusting ply socks to proper fit.    Pertinent History Right TTA, Posterior Lateral Fusion T11-L3 09/2018, OA LBP & left knee, anxiety, depression    Limitations Standing;Walking;House hold activities    How long can you walk comfortably? 8/10 first walking then settles then 6/10. 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 Yes    Pain Score 7     Pain Location Leg    Pain Orientation Right  Pain Descriptors / Indicators Aching    Pain Type Acute pain    Pain Onset 1 to 4 weeks ago    Pain Frequency Constant    Aggravating Factors  prosthetic gait    Pain Relieving Factors taking prosthesis off    Pain Onset More than a month ago    Pain Onset More than a month ago                             [facility] Adult PT Treatment/Exercise - 04/26/20 1015      Transfers   Transfers Sit to Stand;Stand to Sit;Floor to Transfer    Sit to Stand 5: Supervision;With upper extremity assist;With armrests;From chair/3-in-1    Stand to Sit 5: Supervision;With upper extremity assist;With armrests;To chair/3-in-1    Floor to Transfer 5: Supervision;With upper extremity assist    Floor to Transfer Details (indicate cue type and reason) demo & verbal cues technique with TTA prosthesis pushing on chair bottom.  Pt return demo understanding.         Ambulation/Gait   Ambulation/Gait Yes    Ambulation/Gait  Assistance 5: Supervision    Ambulation/Gait Assistance Details cues on weight shift to directly weight bear over prosthesis.     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 Pt verbalized understanding.      Prosthetics   Prosthetic Care Comments  increase wear to 3-4 hours 3x/day.  Patient spoke to friend who has bilateral TTA with suction sleeve suspension. PT explained differences with recommendation to start looking closer in Nov with seeing MD for prescripton in early Dec. If billed by 12/31, he will have less out of pocket expenses    Current prosthetic wear tolerance (days/week)  daily    Current prosthetic wear tolerance (#hours/day)  2-3 hours 2x/day liner with intermittent prosthesis 15-30 minutes.   PT recommended increasing to 3 hrs 3x/day with off 2 hour between wear. Prosthesis can be removed if becomes uncomfortable.     Current prosthetic weight-bearing tolerance (hours/day)  Patient tolerates standing for 5 minutes with partial weight on prosthesis with limb pain 8-9/10.      Edema pitting edema    Residual limb condition  superficial opening at distal tibia with no drainage,  scar adhered at distal tibia, good hair growth, normal color & moisture.      Education Provided Skin check;Residual limb care;Prosthetic cleaning;Correct ply sock adjustment;Proper Donning;Proper Doffing;Proper wear  schedule/adjustment;Proper weight-bearing schedule/adjustment;Other (comment)   see prosthetic care comments   Person(s) Educated Patient    Education Method Explanation;Verbal cues    Education Method Verbalized understanding;Verbal cues required;Needs further instruction    Donning Prosthesis Supervision    Doffing Prosthesis Modified independent (device/increased time)                    PT Short Term Goals - 04/19/20 1603      PT  SHORT TERM GOAL #1   Title Patient demonstrates proper donning & verbalizes general understanding of adjusting ply socks. (All STGs Target Date: 05/13/2020)    Time 4    Period Weeks    Status On-going    Target Date 05/13/20      PT SHORT TERM GOAL #2   Title Patient tolerates wear of prosthetic liner >8hrs /day and prosthesis >4 of those hours.    Time 4    Period Weeks    Status On-going    Target Date 05/13/20      PT SHORT TERM GOAL #3   Title Patient reports residual limb pain </=7/10 with wear & standing.    Time 4    Period Weeks    Status On-going    Target Date 05/13/20      PT SHORT TERM GOAL #4   Title Patient able to pick up items from floor without UE support.    Time 4    Period Weeks    Status On-going    Target Date 05/13/20      PT SHORT TERM GOAL #5   Title Patient ambulates 100' with cane & prosthesis with supervision.    Time 4    Period Weeks    Status On-going    Target Date 05/13/20             PT Long Term Goals - 04/19/20 1604      PT LONG TERM GOAL #1   Title Patient verbalizes & demonstrates understanding of proper prosthetic care to enable safe use of prosthesis. (All LTGs Target Date: 07/08/2020)    Time 12    Period Weeks    Status On-going    Target Date 07/08/20      PT LONG TERM GOAL #2   Title Patient tolerates prosthesis wear >90% of awake hours without skin issues & residual limb pain </=4/10.    Time 12    Period Weeks    Status On-going    Target Date 07/08/20      PT LONG  TERM GOAL #3   Title Berg Balance >45/56 indicating lower fall risk.    Time 12    Period Weeks    Status On-going    Target Date 07/08/20      PT LONG TERM GOAL #4   Title Patient ambulates >1000' outdoors including grass with cane or less & prosthesis modified independent.    Time 12    Period Weeks    Status On-going    Target Date 07/08/20      PT LONG TERM GOAL #5   Title Patient negotiates ramps, curbs & stairs with cane or less & prosthesis modified independent.    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    Time 12    Period Weeks    Status On-going    Target Date 07/08/20      PT LONG TERM GOAL #7   Title Patient reports above tasks increases his chronic back & knee pain <2 increments on 0-10 scale.    Time 12    Period Weeks    Status On-going    Target Date 07/08/20  Plan - 04/26/20 1615    Clinical Impression Statement Patient continues to be limited by residual limb pain. 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 Patient wanted to discuss other socket designs as he spoke with another person with amputations that he liked.    Personal Factors and Comorbidities Comorbidity 3+;Fitness;Past/Current Experience;Time since onset of injury/illness/exacerbation    Comorbidities Right TTA, Posterior Lateral Fusion T11-L3 09/2018, OA LBP & left knee, anxiety, depression    Examination-Activity Limitations Lift;Locomotion Level;Squat;Stairs;Stand;Transfers    Examination-Participation Restrictions Community Activity;Yard Work;Other   farm tasks   Stability/Clinical Decision Making Evolving/Moderate complexity    Rehab Potential Good    PT Frequency 2x / week    PT Duration 12 weeks    PT Treatment/Interventions ADLs/Self Care Home  Management;DME Instruction;Moist Heat;Electrical Stimulation;Gait training;Stair training;Functional mobility training;Therapeutic activities;Therapeutic exercise;Balance training;Neuromuscular re-education;Patient/family education;Prosthetic Training;Manual techniques;Scar mobilization;Passive range of motion;Dry needling;Vestibular    PT Next Visit Plan review prosthetic care, Instruct in negotiating ramps, review curbs & stairs with crutches, instruct in HEP at sink    Consulted and Agree with Plan of Care Patient           Patient will benefit from skilled therapeutic intervention in order to improve the following deficits and impairments:  Abnormal gait, Decreased activity tolerance, Decreased balance, Decreased endurance, Decreased knowledge of use of DME, Decreased mobility, Decreased range of motion, Decreased skin integrity, Decreased scar mobility, Decreased strength, Impaired flexibility, Postural dysfunction, Prosthetic Dependency, Pain  Visit Diagnosis: Other abnormalities of gait and mobility  Unsteadiness on feet  Pain in right lower leg  Chronic pain of left knee  Chronic midline low back pain without sciatica  Stiffness of left knee, not elsewhere classified  Stiffness of right knee, not elsewhere classified  Muscle weakness (generalized)     Problem List Patient Active Problem List   Diagnosis Date Noted   Acute hematogenous osteomyelitis of right foot ([place]) 01/29/2020   Post-traumatic arthritis of ankle, right    Cutaneous abscess of right ankle    Subacute osteomyelitis of right tibia (HCC)    Fatty liver 06/30/2019   Calculus of gallbladder without cholecystitis without obstruction 06/30/2019   Nausea without vomiting 05/13/2019   Abdominal bloating 05/13/2019   Wound infection following procedure 12/19/2018   Malunion of joint fusion ([place]) 11/26/2018   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HCC)    Status post lumbar spinal fusion 09/10/2018   S/P ankle  fusion 11/27/2017   Nonunion of subtalar arthrodesis    Avascular necrosis of talus, right (HCC)    Post-traumatic osteoarthritis, left ankle and foot 10/22/2017   DDD (degenerative disc disease), lumbar 04/05/2017   Fracture of L2 vertebra ([place]) 01/12/2015   Acute osteomyelitis of calcaneum ([place]) 10/09/2014   Dysuria 10/05/2014   Cellulitis 10/05/2014   Fall from ladder 09/21/2014   L2 vertebral fracture ([place]) 09/21/2014   Bilateral calcaneal fractures 09/21/2014   Chronic pain 09/21/2014   Acute blood loss anemia 09/21/2014   Open fracture of both calcanei 09/15/2014    Jamey Reas PT, DPT 04/26/2020, 4:21 PM  [facility] Physical Therapy [address] [place], [place], [address] Phone: [phone]   Fax:  [phone]  Name: Bradley Espinoza MRN: [id number] Date of Birth: 1956-05-26

## 2020-04-28 ENCOUNTER — Encounter: Payer: Self-pay | Admitting: Physical Therapy

## 2020-04-28 ENCOUNTER — Ambulatory Visit (INDEPENDENT_AMBULATORY_CARE_PROVIDER_SITE_OTHER): Payer: PPO | Admitting: Orthopedic Surgery

## 2020-04-28 ENCOUNTER — Other Ambulatory Visit: Payer: Self-pay

## 2020-04-28 ENCOUNTER — Encounter: Payer: Self-pay | Admitting: Physician Assistant

## 2020-04-28 ENCOUNTER — Ambulatory Visit (INDEPENDENT_AMBULATORY_CARE_PROVIDER_SITE_OTHER): Payer: PPO | Admitting: Physical Therapy

## 2020-04-28 VITALS — Ht 72.0 in | Wt 143.0 lb

## 2020-04-28 DIAGNOSIS — M545 Low back pain: Secondary | ICD-10-CM | POA: Diagnosis not present

## 2020-04-28 DIAGNOSIS — R2681 Unsteadiness on feet: Secondary | ICD-10-CM

## 2020-04-28 DIAGNOSIS — G8929 Other chronic pain: Secondary | ICD-10-CM

## 2020-04-28 DIAGNOSIS — M25562 Pain in left knee: Secondary | ICD-10-CM

## 2020-04-28 DIAGNOSIS — M6281 Muscle weakness (generalized): Secondary | ICD-10-CM

## 2020-04-28 DIAGNOSIS — M25662 Stiffness of left knee, not elsewhere classified: Secondary | ICD-10-CM

## 2020-04-28 DIAGNOSIS — M79661 Pain in right lower leg: Secondary | ICD-10-CM

## 2020-04-28 DIAGNOSIS — M25661 Stiffness of right knee, not elsewhere classified: Secondary | ICD-10-CM | POA: Diagnosis not present

## 2020-04-28 DIAGNOSIS — M19172 Post-traumatic osteoarthritis, left ankle and foot: Secondary | ICD-10-CM | POA: Diagnosis not present

## 2020-04-28 DIAGNOSIS — R2689 Other abnormalities of gait and mobility: Secondary | ICD-10-CM

## 2020-04-28 DIAGNOSIS — Z89511 Acquired absence of right leg below knee: Secondary | ICD-10-CM

## 2020-04-28 MED ORDER — LIDOCAINE HCL 1 % IJ SOLN
1.0000 mL | INTRAMUSCULAR | Status: AC | PRN
  Administered 2020-04-28: 1 mL

## 2020-04-28 MED ORDER — METHYLPREDNISOLONE ACETATE 40 MG/ML IJ SUSP
40.0000 mg | INTRAMUSCULAR | Status: AC | PRN
  Administered 2020-04-28: 40 mg via INTRA_ARTICULAR

## 2020-04-28 NOTE — Progress Notes (Signed)
Office Visit Note   Patient: Bradley Espinoza           Date of Birth: 1956/04/22           MRN: [id number] Visit Date: 04/28/2020              Requested by: Celene Squibb, MD [place],  [place] [id number] PCP: Celene Squibb, MD  Chief Complaint  Patient presents with  . Right Leg - Routine Post Op    01/29/20 Right BKA  . Left Knee - Follow-up  . Left Foot - Pain      HPI: Patient is a 64 year old gentleman who presents for 2 separate issues #1 right transtibial amputation he states that physical therapy is actually helping.  He states he will follow up with the orthotist for padding in the socket.  Patient complains of pain in his sinus Tarsi left foot.  He states steroid injections have worked in the past.  [place]: Visit Diagnoses:  1. Acquired absence of right leg below knee ([place])   2. Post-traumatic osteoarthritis, left ankle and foot     Plan: Subtalar joint was injected in the left foot he will follow up the orthotist for socket padding.  Follow-up as needed discussed treatment options for the subtalar arthritis from his previous calcaneus fracture.  Follow-Up Instructions: Return if symptoms worsen or fail to improve.   Ortho Exam  Patient is alert, oriented, no adenopathy, well-dressed, normal affect, normal respiratory effort. Examination patient is ambulating with crutches he complains of some end bearing pressure on the distal tibia he will get the socket modified.  Examination of the left foot he has decreased subtalar motion is pain to palpation over the sinus Tarsi this reproduces his symptoms.  Imaging: No results found. No images are attached to the encounter.  Labs: Lab Results  Component Value Date   HGBA1C 5.1 06/26/2016   ESRSEDRATE 9 12/08/2018   REPTSTATUS 10/04/2019 FINAL 09/29/2019   GRAMSTAIN  09/29/2019    ABUNDANT WBC PRESENT, PREDOMINANTLY PMN FEW GRAM POSITIVE COCCI    CULT  09/29/2019    FEW STAPHYLOCOCCUS  AUREUS NO ANAEROBES ISOLATED Performed at [facility], [place] [address] [place], [place] [address]    LABORGA STAPHYLOCOCCUS AUREUS 09/29/2019     Lab Results  Component Value Date   ALBUMIN 4.4 07/02/2019   ALBUMIN 4.3 08/12/2015   ALBUMIN 4.1 05/06/2015    No results found for: MG No results found for: VD25OH  No results found for: PREALBUMIN CBC EXTENDED Latest Ref Rng & Units 01/29/2020 09/29/2019 09/09/2019  WBC 4.0 - 10.5 K/uL 6.7 6.8 6.0  RBC 4.22 - 5.81 MIL/uL 5.34 4.67 5.72  HGB 13.0 - 17.0 g/dL 15.2 13.1 16.4  HCT 39 - 52 % 48.1 42.0 51.1  PLT 150 - 400 K/uL 221 305 232  NEUTROABS 1.7 - 7.7 K/uL - - -  LYMPHSABS 0.7 - 4.0 K/uL - - -     Body mass index is 19.39 kg/m.  Orders:  No orders of the defined types were placed in this encounter.  No orders of the defined types were placed in this encounter.    Procedures: Small Joint Inj: L subtalar on 04/28/2020 11:13 AM Indications: pain and diagnostic evaluation Details: 22 G needle  Spinal Needle: No  Medications: 1 mL lidocaine 1 %; 40 mg methylPREDNISolone acetate 40 MG/ML Outcome: tolerated well, no immediate complications Procedure, treatment alternatives, risks and benefits  explained, specific risks discussed. Consent was given by the patient. Immediately prior to procedure a time out was called to verify the correct patient, procedure, equipment, support staff and site/side marked as required. Patient was prepped and draped in the usual sterile fashion.      Clinical Data: No additional findings.  ROS:  All other systems negative, except as noted in the HPI. Review of Systems  Objective: Vital Signs: Ht 6' (1.829 m)   Wt 143 lb (64.9 kg)   BMI 19.39 kg/m   Specialty Comments:  No specialty comments available.  PMFS History: Patient Active Problem List   Diagnosis Date Noted  . Acute hematogenous osteomyelitis of right foot ([place]) 01/29/2020  . Post-traumatic arthritis of ankle,  right   . Cutaneous abscess of right ankle   . Subacute osteomyelitis of right tibia ([place])   . Fatty liver 06/30/2019  . Calculus of gallbladder without cholecystitis without obstruction 06/30/2019  . Nausea without vomiting 05/13/2019  . Abdominal bloating 05/13/2019  . Wound infection following procedure 12/19/2018  . Malunion of joint fusion ([place]) 11/26/2018  . 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[facility])   . Status post lumbar spinal fusion 09/10/2018  . S/P ankle fusion 11/27/2017  . Nonunion of subtalar arthrodesis   . Avascular necrosis of talus, right ([place])   . Post-traumatic osteoarthritis, left ankle and foot 10/22/2017  . DDD (degenerative disc disease), lumbar 04/05/2017  . Fracture of L2 vertebra ([place]) 01/12/2015  . Acute osteomyelitis of calcaneum ([place]) 10/09/2014  . Dysuria 10/05/2014  . Cellulitis 10/05/2014  . Fall from ladder 09/21/2014  . L2 vertebral fracture ([place]) 09/21/2014  . Bilateral calcaneal fractures 09/21/2014  . Chronic pain 09/21/2014  . Acute blood loss anemia 09/21/2014  . Open fracture of both calcanei 09/15/2014   Past Medical History:  Diagnosis Date  . Anxiety   . Arthritis    low back pain, lumbar radiculopathy  . Depression   . Fatty liver   . Fracture    B/L ankles  . GERD (gastroesophageal reflux disease)   . Headache(784.0)    allergy related   . History of kidney stones    passed stones  . History of stomach ulcers   . Retained orthopedic hardware    failed retained hardware right foot  . Wears glasses   . Wears glasses   . Wears partial dentures    upper    Family History  Problem Relation Age of Onset  . Diabetes Father   . Cancer Other     Past Surgical History:  Procedure Laterality Date  . AMPUTATION Right 01/29/2020   Procedure: RIGHT BELOW KNEE AMPUTATION;  Surgeon: Newt Minion, MD;  Location: [facility];  Service: Orthopedics;  Laterality: Right;  . ANKLE FUSION Right 05/11/2015   Procedure: Right Posterior Arthroscopic Subtalar  Arthrodesis;  Surgeon: Newt Minion, MD;  Location: [facility];  Service: Orthopedics;  Laterality: Right;  . ANKLE FUSION Right 11/27/2017   Procedure: RIGHT TIBIOCALCANEAL FUSION;  Surgeon: Newt Minion, MD;  Location: [facility];  Service: Orthopedics;  Laterality: Right;  . ANKLE FUSION Right 11/26/2018   Procedure: RIGHT ANTERIOR ANKLE FUSION, APPLY VAC;  Surgeon: Newt Minion, MD;  Location: [facility];  Service: Orthopedics;  Laterality: Right;  . ANKLE FUSION Right 09/09/2019   Procedure: REVISION RIGHT ANKLE FUSION;  Surgeon: Newt Minion, MD;  Location: [facility];  Service: Orthopedics;  Laterality: Right;  . ANTERIOR LAT LUMBAR FUSION N/A 04/05/2017   Procedure: Extreme Lateral  Interbody Fusion - Lumbar three-lumbar four ,exploration of fusion Posterior augmentation with globus addition Removal hardware Lumbar one-three. Lumbar four-sacral one,  Removal internal bone growth stimulator;  Surgeon: Kary Kos, MD;  Location: [facility];  Service: Neurosurgery;  Laterality: N/A;  . APPLICATION OF WOUND VAC  12/23/2018   Procedure: Application Of Wound Vac;  Surgeon: Newt Minion, MD;  Location: [facility];  Service: Orthopedics;;  . BACK SURGERY  2004   x 2  . BIOPSY  06/05/2019   Procedure: BIOPSY;  Surgeon: Rogene Houston, MD;  [facility];  Service: Endoscopy;;  gastric biopsy  . CERVICAL SPINE SURGERY  2008  . CHOLECYSTECTOMY N/A 07/06/2019   Procedure: LAPAROSCOPIC CHOLECYSTECTOMY;  Surgeon: Virl Cagey, MD;  [facility];  Service: General;  Laterality: N/A;  . COLONOSCOPY WITH PROPOFOL N/A 06/05/2019   Procedure: COLONOSCOPY WITH PROPOFOL;  Surgeon: Rogene Houston, MD;  [facility];  Service: Endoscopy;  Laterality: N/A;  . ESOPHAGOGASTRODUODENOSCOPY    . ESOPHAGOGASTRODUODENOSCOPY (EGD) WITH PROPOFOL N/A 06/05/2019   Procedure: ESOPHAGOGASTRODUODENOSCOPY (EGD) WITH PROPOFOL;  Surgeon: Rogene Houston, MD;  [facility];  Service: Endoscopy;  Laterality: N/A;   1220pm  . HARDWARE REMOVAL Right 10/09/2014   Procedure: Removal Deep Hardware, Irrigation and Debridement Calcaneus, Place Antibiotic Beads and Wound VAC ;  Surgeon: Newt Minion, MD;  Location: [facility];  Service: Orthopedics;  Laterality: Right;  . HARDWARE REMOVAL Right 08/12/2015   Procedure: Removal Deep Hardware Right Foot;  Surgeon: Newt Minion, MD;  Location: [facility];  Service: Orthopedics;  Laterality: Right;  . HARDWARE REMOVAL Right 11/26/2018   Procedure: REMOVAL RIGHT TIBIOCALCANEAL NAIL;  Surgeon: Newt Minion, MD;  Location: [facility];  Service: Orthopedics;  Laterality: Right;  . HARDWARE REMOVAL Right 12/23/2018   Procedure: RIGHT ANKLE REMOVE HARDWARE, PLACE ANTIBIOTIC BEADS and placement of wound vac;  Surgeon: Newt Minion, MD;  Location: [facility];  Service: Orthopedics;  Laterality: Right;  . HARDWARE REMOVAL Right 09/29/2019   Procedure: RIGHT ANKLE REMOVE HARDWARE, DEBRIDEMENT;  Surgeon: Newt Minion, MD;  Location: [facility];  Service: Orthopedics;  Laterality: Right;  . I & D EXTREMITY Right 09/15/2014   Procedure: IRRIGATION AND DEBRIDEMENT Ankle;  Surgeon: Renette Butters, MD;  Location: [facility];  Service: Orthopedics;  Laterality: Right;  . INGUINAL HERNIA REPAIR Bilateral   . LAMINECTOMY WITH POSTERIOR LATERAL ARTHRODESIS LEVEL 4 N/A 09/10/2018   Procedure: Posterior Lateral Fusion - Thoracic Eleven-Thoracic Twelve - Thoracic Twelve-Lumbar One - Lumbar One-Lumbar Two - Lumbar Two-Lumbar Three with instrumentaion and PLA;  Surgeon: Kary Kos, MD;  Location: [facility];  Service: Neurosurgery;  Laterality: N/A;  Posterior Lateral Fusion - Thoracic Eleven-Thoracic Twelve - Thoracic Twelve-Lumbar One - Lumbar One-Lumbar Two - Lumbar Two-Lumbar Thre  . LIVER BIOPSY N/A 07/06/2019   Procedure: LIVER BIOPSY;  Surgeon: Virl Cagey, MD;  [facility];  Service: General;  Laterality: N/A;  . LUMBAR PERCUTANEOUS PEDICLE SCREW 1 LEVEL N/A 04/05/2017   Procedure: LUMBAR PERCUTANEOUS  PEDICLE SCREW LUMBAR THREE-FOUR;  Surgeon: Kary Kos, MD;  Location: [facility];  Service: Neurosurgery;  Laterality: N/A;  . ORIF CALCANEOUS FRACTURE Right 09/19/2014   Procedure: OPEN REDUCTION INTERNAL FIXATION (ORIF) CALCANEOUS FRACTURE;  Surgeon: Newt Minion, MD;  Location: [facility];  Service: Orthopedics;  Laterality: Right;  . ORIF CALCANEOUS FRACTURE Left 09/19/2014   Procedure: OPEN REDUCTION INTERNAL FIXATION (ORIF) CALCANEOUS FRACTURE;  Surgeon: Meridee Score  V, MD;  Location: [facility];  Service: Orthopedics;  Laterality: Left;  . POLYPECTOMY  06/05/2019   Procedure: POLYPECTOMY;  Surgeon: Rogene Houston, MD;  [facility];  Service: Endoscopy;;  cecal polyp biopsy forcep, ascending polyp cold snare   Social History   Occupational History    Comment: disabled  Tobacco Use  . Smoking status: Former Smoker    Packs/day: 1.00    Years: 10.00    Pack years: 10.00    Types: Cigars    Start date: 10/08/1976    Quit date: 09/2017    Years since quitting: 2.6  . Smokeless tobacco: Never Used  . Tobacco comment: quit smoking cegar 09/10/2018  Vaping Use  . Vaping Use: Never used  Substance and Sexual Activity  . Alcohol use: No    Comment:  quit 1998  . Drug use: No  . Sexual activity: Yes    Birth control/protection: None

## 2020-04-28 NOTE — Therapy (Signed)
[facility] [address] [place], [place], [address] Phone: [phone]   Fax:  [phone]  Physical Therapy Treatment  Patient Details  Name: Bradley Espinoza MRN: [id number] Date of Birth: March 26, 1956 Referring Provider (PT): Meridee Score, MD   Encounter Date: 04/28/2020   PT End of Session - 04/28/20 1037    Visit Number 5    Number of Visits 25    Date for PT Re-Evaluation 07/13/20    Authorization Type [employer]    Authorization Time Period $15 co-pay    Progress Note Due on Visit 10    PT Start Time 0847    PT Stop Time 0932    PT Time Calculation (min) 45 min    Activity Tolerance Patient tolerated treatment well;Patient limited by pain    Behavior During Therapy [facility] for tasks assessed/performed           Past Medical History:  Diagnosis Date   Anxiety    Arthritis    low back pain, lumbar radiculopathy   Depression    Fatty liver    Fracture    B/L ankles   GERD (gastroesophageal reflux disease)    Headache(784.0)    allergy related    History of kidney stones    passed stones   History of stomach ulcers    Retained orthopedic hardware    failed retained hardware right foot   Wears glasses    Wears glasses    Wears partial dentures    upper    Past Surgical History:  Procedure Laterality Date   AMPUTATION Right 01/29/2020   Procedure: RIGHT BELOW KNEE AMPUTATION;  Surgeon: Newt Minion, MD;  Location: [place];  Service: Orthopedics;  Laterality: Right;   ANKLE FUSION Right 05/11/2015   Procedure: Right Posterior Arthroscopic Subtalar Arthrodesis;  Surgeon: Newt Minion, MD;  Location: [place];  Service: Orthopedics;  Laterality: Right;   ANKLE FUSION Right 11/27/2017   Procedure: RIGHT TIBIOCALCANEAL FUSION;  Surgeon: Newt Minion, MD;  Location: [place];  Service: Orthopedics;  Laterality: Right;   ANKLE FUSION Right 11/26/2018   Procedure: RIGHT ANTERIOR ANKLE FUSION, APPLY VAC;   Surgeon: Newt Minion, MD;  Location: [place];  Service: Orthopedics;  Laterality: Right;   ANKLE FUSION Right 09/09/2019   Procedure: REVISION RIGHT ANKLE FUSION;  Surgeon: Newt Minion, MD;  Location: [place];  Service: Orthopedics;  Laterality: Right;   ANTERIOR LAT LUMBAR FUSION N/A 04/05/2017   Procedure: Extreme Lateral Interbody Fusion - Lumbar three-lumbar four ,exploration of fusion Posterior augmentation with globus addition Removal hardware Lumbar one-three. Lumbar four-sacral one,  Removal internal bone growth stimulator;  Surgeon: Kary Kos, MD;  Location: [place];  Service: Neurosurgery;  Laterality: N/A;   APPLICATION OF WOUND VAC  12/23/2018   Procedure: Application Of Wound Vac;  Surgeon: Newt Minion, MD;  Location: [place];  Service: Orthopedics;;   BACK SURGERY  2004   x 2   BIOPSY  06/05/2019   Procedure: BIOPSY;  Surgeon: Rogene Houston, MD;  [facility];  Service: Endoscopy;;  gastric biopsy   CERVICAL SPINE SURGERY  2008   CHOLECYSTECTOMY N/A 07/06/2019   Procedure: LAPAROSCOPIC CHOLECYSTECTOMY;  Surgeon: Virl Cagey, MD;  [facility]: [facility];  Service: General;  Laterality: N/A;   COLONOSCOPY WITH PROPOFOL N/A 06/05/2019   Procedure: COLONOSCOPY WITH PROPOFOL;  Surgeon: Rogene Houston, MD;  [facility];  Service: Endoscopy;  Laterality: N/A;  ESOPHAGOGASTRODUODENOSCOPY     ESOPHAGOGASTRODUODENOSCOPY (EGD) WITH PROPOFOL N/A 06/05/2019   Procedure: ESOPHAGOGASTRODUODENOSCOPY (EGD) WITH PROPOFOL;  Surgeon: Rogene Houston, MD;  [facility];  Service: Endoscopy;  Laterality: N/A;  1220pm   HARDWARE REMOVAL Right 10/09/2014   Procedure: Removal Deep Hardware, Irrigation and Debridement Calcaneus, Place Antibiotic Beads and Wound VAC ;  Surgeon: Newt Minion, MD;  Location: [place];  Service: Orthopedics;  Laterality: Right;   HARDWARE REMOVAL Right 08/12/2015   Procedure: Removal Deep Hardware Right Foot;  Surgeon: Newt Minion,  MD;  Location: [place];  Service: Orthopedics;  Laterality: Right;   HARDWARE REMOVAL Right 11/26/2018   Procedure: REMOVAL RIGHT TIBIOCALCANEAL NAIL;  Surgeon: Newt Minion, MD;  Location: [place];  Service: Orthopedics;  Laterality: Right;   HARDWARE REMOVAL Right 12/23/2018   Procedure: RIGHT ANKLE REMOVE HARDWARE, PLACE ANTIBIOTIC BEADS and placement of wound vac;  Surgeon: Newt Minion, MD;  Location: [place];  Service: Orthopedics;  Laterality: Right;   HARDWARE REMOVAL Right 09/29/2019   Procedure: RIGHT ANKLE REMOVE HARDWARE, DEBRIDEMENT;  Surgeon: Newt Minion, MD;  Location: [place];  Service: Orthopedics;  Laterality: Right;   I & D EXTREMITY Right 09/15/2014   Procedure: IRRIGATION AND DEBRIDEMENT Ankle;  Surgeon: Renette Butters, MD;  Location: [place];  Service: Orthopedics;  Laterality: Right;   INGUINAL HERNIA REPAIR Bilateral    LAMINECTOMY WITH POSTERIOR LATERAL ARTHRODESIS LEVEL 4 N/A 09/10/2018   Procedure: Posterior Lateral Fusion - Thoracic Eleven-Thoracic Twelve - Thoracic Twelve-Lumbar One - Lumbar One-Lumbar Two - Lumbar Two-Lumbar Three with instrumentaion and PLA;  Surgeon: Kary Kos, MD;  Location: [place];  Service: Neurosurgery;  Laterality: N/A;  Posterior Lateral Fusion - Thoracic Eleven-Thoracic Twelve - Thoracic Twelve-Lumbar One - Lumbar One-Lumbar Two - Lumbar Two-Lumbar Thre   LIVER BIOPSY N/A 07/06/2019   Procedure: LIVER BIOPSY;  Surgeon: Virl Cagey, MD;  [facility];  Service: General;  Laterality: N/A;   LUMBAR PERCUTANEOUS PEDICLE SCREW 1 LEVEL N/A 04/05/2017   Procedure: LUMBAR PERCUTANEOUS PEDICLE SCREW LUMBAR THREE-FOUR;  Surgeon: Kary Kos, MD;  Location: [place];  Service: Neurosurgery;  Laterality: N/A;   ORIF CALCANEOUS FRACTURE Right 09/19/2014   Procedure: OPEN REDUCTION INTERNAL FIXATION (ORIF) CALCANEOUS FRACTURE;  Surgeon: Newt Minion, MD;  Location: [place];  Service: Orthopedics;  Laterality: Right;   ORIF CALCANEOUS FRACTURE Left  09/19/2014   Procedure: OPEN REDUCTION INTERNAL FIXATION (ORIF) CALCANEOUS FRACTURE;  Surgeon: Newt Minion, MD;  Location: [place];  Service: Orthopedics;  Laterality: Left;   POLYPECTOMY  06/05/2019   Procedure: POLYPECTOMY;  Surgeon: Rogene Houston, MD;  [facility];  Service: Endoscopy;;  cecal polyp biopsy forcep, ascending polyp cold snare    There were no vitals filed for this visit.   Subjective Assessment - 04/28/20 0849    Subjective He tried lifting some without issues. He is wearing prosthesis 6hrs in morning & 4 hours in afternoon.    Pertinent History Right TTA, Posterior Lateral Fusion T11-L3 09/2018, OA LBP & left knee, anxiety, depression    Limitations Standing;Walking;House hold activities    How long can you walk comfortably? 8/10 first walking then settles then 6/10. 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 Yes    Pain Score 6     Pain Location Leg  Pain Onset 1 to 4 weeks ago    Aggravating Factors  standing & walking 8/10, sitting with prosthesis on limb 6/10    Pain Relieving Factors taking prosthesis off then 1 hour later 4/10    Pain Onset More than a month ago    Pain Onset More than a month ago                             [facility] Adult PT Treatment/Exercise - 04/28/20 0847      Transfers   Transfers Sit to Stand;Stand to Sit;Floor to Transfer    Sit to Stand 5: Supervision;With upper extremity assist;With armrests;From chair/3-in-1    Stand to Sit 5: Supervision;With upper extremity assist;With armrests;To chair/3-in-1    Floor to Transfer --      Ambulation/Gait   Ambulation/Gait Yes    Ambulation/Gait Assistance 5: Supervision    Ambulation Distance (Feet) 200 Feet   200' X 2   Assistive device Crutches;Prosthesis    Gait Pattern Step-through pattern;Decreased stance time - right;Decreased step length -  left;Decreased hip/knee flexion - right;Decreased weight shift to right;Right hip hike;Right foot flat;Right flexed knee in stance;Left flexed knee in stance;Antalgic;Lateral hip instability;Trunk flexed;Abducted- right      Therapeutic Activites    Therapeutic Activities --    Lifting --    Work Economist --      Neuro Re-ed    Neuro Re-ed Details  balance training in parallel bars with intermittent touch using mirror for visual feedback:  crossways on foam beam hip width stance - static up to 30 seconds without touch, head turns right/left & up/down touching with most movements;  standing tandem 60 seconds with prosthesis forward & back with intermittent touch.        Knee/Hip Exercises: Stretches   Other Knee/Hip Stretches supine knee to chest single leg RLE & LLE and double knees to chest 30second hold 2 reps each.   Pt verbalized & return demo understanding as HEP      Other Knee/Hip Stretches standing with posterior pelvis to door frame with feet equal stance hip width - reaching single UE overhead & BUEs overhead - 2 deep breathes hold 2 reps ea.   Pt verbalized & return demo understanding as HEP      Knee/Hip Exercises: Standing   Forward Step Up Right;10 reps;Step Height: 4";Hand Hold: 0    Forward Step Up Limitations intermittent touch on //bar for balance    Step Down Right;5 reps;Step Height: 4";Hand Hold: 0    Step Down Limitations prosthetic toe over edge of step to enable knee flexion.       Prosthetics   Prosthetic Care Comments  PT called prosthetist & appt to see this morning after his MD appt to place thin pretibial pads and check alignment without UE support.  PT instructed will need to decrease ply socks as pads take up space.      Current prosthetic wear tolerance (days/week)  daily    Current prosthetic wear tolerance (#hours/day)  6 hours in am & 4 hours in pm - liner whole time and removes prosthesis for 15 minutes when painful.      Current prosthetic weight-bearing  tolerance (hours/day)  Patient tolerates standing for 5-15 minutes with partial weight on prosthesis with limb pain 8-9/10.      Edema pitting edema    Residual limb condition  no open areas, redness over tibial crest, good hair growth  Education Provided Skin check;Residual limb care;Correct ply sock adjustment;Proper wear schedule/adjustment;Proper weight-bearing schedule/adjustment;Other (comment)   see prosthetic care comments   Person(s) Educated Patient    Education Method Explanation;Verbal cues    Education Method Verbalized understanding;Needs further instruction;Verbal cues required    Donning Prosthesis Modified independent (device/increased time)    Doffing Prosthesis Modified independent (device/increased time)                    PT Short Term Goals - 04/19/20 1603      PT SHORT TERM GOAL #1   Title Patient demonstrates proper donning & verbalizes general understanding of adjusting ply socks. (All STGs Target Date: 05/13/2020)    Time 4    Period Weeks    Status On-going    Target Date 05/13/20      PT SHORT TERM GOAL #2   Title Patient tolerates wear of prosthetic liner >8hrs /day and prosthesis >4 of those hours.    Time 4    Period Weeks    Status On-going    Target Date 05/13/20      PT SHORT TERM GOAL #3   Title Patient reports residual limb pain </=7/10 with wear & standing.    Time 4    Period Weeks    Status On-going    Target Date 05/13/20      PT SHORT TERM GOAL #4   Title Patient able to pick up items from floor without UE support.    Time 4    Period Weeks    Status On-going    Target Date 05/13/20      PT SHORT TERM GOAL #5   Title Patient ambulates 100' with cane & prosthesis with supervision.    Time 4    Period Weeks    Status On-going    Target Date 05/13/20             PT Long Term Goals - 04/19/20 1604      PT LONG TERM GOAL #1   Title Patient verbalizes & demonstrates understanding of proper prosthetic care to enable  safe use of prosthesis. (All LTGs Target Date: 07/08/2020)    Time 12    Period Weeks    Status On-going    Target Date 07/08/20      PT LONG TERM GOAL #2   Title Patient tolerates prosthesis wear >90% of awake hours without skin issues & residual limb pain </=4/10.    Time 12    Period Weeks    Status On-going    Target Date 07/08/20      PT LONG TERM GOAL #3   Title Berg Balance >45/56 indicating lower fall risk.    Time 12    Period Weeks    Status On-going    Target Date 07/08/20      PT LONG TERM GOAL #4   Title Patient ambulates >1000' outdoors including grass with cane or less & prosthesis modified independent.    Time 12    Period Weeks    Status On-going    Target Date 07/08/20      PT LONG TERM GOAL #5   Title Patient negotiates ramps, curbs & stairs with cane or less & prosthesis modified independent.    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    Time 12    Period Weeks    Status On-going    Target Date 07/08/20      PT LONG TERM GOAL #7   Title Patient reports above tasks increases his chronic back & knee pain <2 increments on 0-10 scale.    Time 12    Period Weeks    Status On-going    Target Date 07/08/20                 Plan - 04/28/20 2141    Clinical Impression Statement Patient continues to have signficant residual limb pain with prosthesis wear. PT recommended pre-tibial pads to decrease pressure on tibial crest and prosthetist to insert today.  PT worked on balance activities faciitating equal weight bearing, ankle/residual limb & hip strategies.  PT instructed in HEP of door frame stretches for upright posture and supine knee to chest stretches.    Personal Factors and Comorbidities Comorbidity 3+;Fitness;Past/Current Experience;Time since onset of  injury/illness/exacerbation    Comorbidities Right TTA, Posterior Lateral Fusion T11-L3 09/2018, OA LBP & left knee, anxiety, depression    Examination-Activity Limitations Lift;Locomotion Level;Squat;Stairs;Stand;Transfers    Examination-Participation Restrictions Community Activity;Yard Work;Other   farm tasks   Stability/Clinical Decision Making Evolving/Moderate complexity    Rehab Potential Good    PT Frequency 2x / week    PT Duration 12 weeks    PT Treatment/Interventions ADLs/Self Care Home Management;DME Instruction;Moist Heat;Electrical Stimulation;Gait training;Stair training;Functional mobility training;Therapeutic activities;Therapeutic exercise;Balance training;Neuromuscular re-education;Patient/family education;Prosthetic Training;Manual techniques;Scar mobilization;Passive range of motion;Dry needling;Vestibular    PT Next Visit Plan review prosthetic care, prosthetic gait training & balance activities    Consulted and Agree with Plan of Care Patient           Patient will benefit from skilled therapeutic intervention in order to improve the following deficits and impairments:  Abnormal gait, Decreased activity tolerance, Decreased balance, Decreased endurance, Decreased knowledge of use of DME, Decreased mobility, Decreased range of motion, Decreased skin integrity, Decreased scar mobility, Decreased strength, Impaired flexibility, Postural dysfunction, Prosthetic Dependency, Pain  Visit Diagnosis: Other abnormalities of gait and mobility  Unsteadiness on feet  Pain in right lower leg  Chronic pain of left knee  Chronic midline low back pain without sciatica  Stiffness of left knee, not elsewhere classified  Stiffness of right knee, not elsewhere classified  Muscle weakness (generalized)     Problem List Patient Active Problem List   Diagnosis Date Noted   Acute hematogenous osteomyelitis of right foot ([place]) 01/29/2020   Post-traumatic arthritis of ankle,  right    Cutaneous abscess of right ankle    Subacute osteomyelitis of right tibia ([facility])    Fatty liver 06/30/2019   Calculus of gallbladder without cholecystitis without obstruction 06/30/2019   Nausea without vomiting 05/13/2019   Abdominal bloating 05/13/2019   Wound infection following procedure 12/19/2018   Malunion of joint fusion ([place]) 11/26/2018   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[place])    Status post lumbar spinal fusion 09/10/2018   S/P ankle fusion 11/27/2017   Nonunion of subtalar arthrodesis    Avascular necrosis of talus, right ([place])    Post-traumatic osteoarthritis, left ankle and foot 10/22/2017   DDD (degenerative disc disease), lumbar 04/05/2017   Fracture of L2 vertebra ([place]) 01/12/2015   Acute osteomyelitis of calcaneum ([place]) 10/09/2014   Dysuria 10/05/2014   Cellulitis 10/05/2014   Fall from ladder 09/21/2014   L2 vertebral fracture ([place]) 09/21/2014  Bilateral calcaneal fractures 09/21/2014   Chronic pain 09/21/2014   Acute blood loss anemia 09/21/2014   Open fracture of both calcanei 09/15/2014    Jamey Reas PT, DPT 04/28/2020, 9:52 PM  [facility] [address] [place], [place], [address] Phone: [phone]   Fax:  [phone]  Name: OZZY BOHLKEN MRN: [id number] Date of Birth: 1956-05-02

## 2020-05-03 ENCOUNTER — Other Ambulatory Visit: Payer: Self-pay

## 2020-05-03 ENCOUNTER — Encounter: Payer: Self-pay | Admitting: Physical Therapy

## 2020-05-03 ENCOUNTER — Ambulatory Visit: Payer: PPO | Admitting: Physical Therapy

## 2020-05-03 DIAGNOSIS — M25562 Pain in left knee: Secondary | ICD-10-CM

## 2020-05-03 DIAGNOSIS — R2681 Unsteadiness on feet: Secondary | ICD-10-CM

## 2020-05-03 DIAGNOSIS — M545 Low back pain, unspecified: Secondary | ICD-10-CM

## 2020-05-03 DIAGNOSIS — M79661 Pain in right lower leg: Secondary | ICD-10-CM | POA: Diagnosis not present

## 2020-05-03 DIAGNOSIS — R2689 Other abnormalities of gait and mobility: Secondary | ICD-10-CM

## 2020-05-03 DIAGNOSIS — M25662 Stiffness of left knee, not elsewhere classified: Secondary | ICD-10-CM | POA: Diagnosis not present

## 2020-05-03 DIAGNOSIS — M25661 Stiffness of right knee, not elsewhere classified: Secondary | ICD-10-CM

## 2020-05-03 DIAGNOSIS — M6281 Muscle weakness (generalized): Secondary | ICD-10-CM

## 2020-05-03 DIAGNOSIS — G8929 Other chronic pain: Secondary | ICD-10-CM

## 2020-05-03 NOTE — Therapy (Signed)
[facility] [address] [place], [place], [address] Phone: [phone]   Fax:  [phone]  Physical Therapy Treatment  Patient Details  Name: Bradley Espinoza MRN: [id number] Date of Birth: 10/25/1955 Referring Provider (PT): Meridee Score, MD   Encounter Date: 05/03/2020   PT End of Session - 05/03/20 1251    Visit Number 6    Number of Visits 25    Date for PT Re-Evaluation 07/13/20    Authorization Type [employer]    Authorization Time Period $15 co-pay    Progress Note Due on Visit 10    PT Start Time 1015    PT Stop Time 1100    PT Time Calculation (min) 45 min    Activity Tolerance Patient tolerated treatment well;Patient limited by pain    Behavior During Therapy [facility] for tasks assessed/performed           Past Medical History:  Diagnosis Date   Anxiety    Arthritis    low back pain, lumbar radiculopathy   Depression    Fatty liver    Fracture    B/L ankles   GERD (gastroesophageal reflux disease)    Headache(784.0)    allergy related    History of kidney stones    passed stones   History of stomach ulcers    Retained orthopedic hardware    failed retained hardware right foot   Wears glasses    Wears glasses    Wears partial dentures    upper    Past Surgical History:  Procedure Laterality Date   AMPUTATION Right 01/29/2020   Procedure: RIGHT BELOW KNEE AMPUTATION;  Surgeon: Newt Minion, MD;  Location: [place];  Service: Orthopedics;  Laterality: Right;   ANKLE FUSION Right 05/11/2015   Procedure: Right Posterior Arthroscopic Subtalar Arthrodesis;  Surgeon: Newt Minion, MD;  Location: [place];  Service: Orthopedics;  Laterality: Right;   ANKLE FUSION Right 11/27/2017   Procedure: RIGHT TIBIOCALCANEAL FUSION;  Surgeon: Newt Minion, MD;  Location: [place];  Service: Orthopedics;  Laterality: Right;   ANKLE FUSION Right 11/26/2018   Procedure: RIGHT ANTERIOR ANKLE FUSION, APPLY VAC;   Surgeon: Newt Minion, MD;  Location: [place];  Service: Orthopedics;  Laterality: Right;   ANKLE FUSION Right 09/09/2019   Procedure: REVISION RIGHT ANKLE FUSION;  Surgeon: Newt Minion, MD;  Location: [place];  Service: Orthopedics;  Laterality: Right;   ANTERIOR LAT LUMBAR FUSION N/A 04/05/2017   Procedure: Extreme Lateral Interbody Fusion - Lumbar three-lumbar four ,exploration of fusion Posterior augmentation with globus addition Removal hardware Lumbar one-three. Lumbar four-sacral one,  Removal internal bone growth stimulator;  Surgeon: Kary Kos, MD;  Location: [place];  Service: Neurosurgery;  Laterality: N/A;   APPLICATION OF WOUND VAC  12/23/2018   Procedure: Application Of Wound Vac;  Surgeon: Newt Minion, MD;  Location: [place];  Service: Orthopedics;;   BACK SURGERY  2004   x 2   BIOPSY  06/05/2019   Procedure: BIOPSY;  Surgeon: Rogene Houston, MD;  [facility];  Service: Endoscopy;;  gastric biopsy   CERVICAL SPINE SURGERY  2008   CHOLECYSTECTOMY N/A 07/06/2019   Procedure: LAPAROSCOPIC CHOLECYSTECTOMY;  Surgeon: Virl Cagey, MD;  [facility]: [facility];  Service: General;  Laterality: N/A;   COLONOSCOPY WITH PROPOFOL N/A 06/05/2019   Procedure: COLONOSCOPY WITH PROPOFOL;  Surgeon: Rogene Houston, MD;  [facility];  Service: Endoscopy;  Laterality: N/A;  ESOPHAGOGASTRODUODENOSCOPY     ESOPHAGOGASTRODUODENOSCOPY (EGD) WITH PROPOFOL N/A 06/05/2019   Procedure: ESOPHAGOGASTRODUODENOSCOPY (EGD) WITH PROPOFOL;  Surgeon: Rogene Houston, MD;  [facility];  Service: Endoscopy;  Laterality: N/A;  1220pm   HARDWARE REMOVAL Right 10/09/2014   Procedure: Removal Deep Hardware, Irrigation and Debridement Calcaneus, Place Antibiotic Beads and Wound VAC ;  Surgeon: Newt Minion, MD;  Location: [place];  Service: Orthopedics;  Laterality: Right;   HARDWARE REMOVAL Right 08/12/2015   Procedure: Removal Deep Hardware Right Foot;  Surgeon: Newt Minion,  MD;  Location: [place];  Service: Orthopedics;  Laterality: Right;   HARDWARE REMOVAL Right 11/26/2018   Procedure: REMOVAL RIGHT TIBIOCALCANEAL NAIL;  Surgeon: Newt Minion, MD;  Location: [place];  Service: Orthopedics;  Laterality: Right;   HARDWARE REMOVAL Right 12/23/2018   Procedure: RIGHT ANKLE REMOVE HARDWARE, PLACE ANTIBIOTIC BEADS and placement of wound vac;  Surgeon: Newt Minion, MD;  Location: [place];  Service: Orthopedics;  Laterality: Right;   HARDWARE REMOVAL Right 09/29/2019   Procedure: RIGHT ANKLE REMOVE HARDWARE, DEBRIDEMENT;  Surgeon: Newt Minion, MD;  Location: [place];  Service: Orthopedics;  Laterality: Right;   I & D EXTREMITY Right 09/15/2014   Procedure: IRRIGATION AND DEBRIDEMENT Ankle;  Surgeon: Renette Butters, MD;  Location: [place];  Service: Orthopedics;  Laterality: Right;   INGUINAL HERNIA REPAIR Bilateral    LAMINECTOMY WITH POSTERIOR LATERAL ARTHRODESIS LEVEL 4 N/A 09/10/2018   Procedure: Posterior Lateral Fusion - Thoracic Eleven-Thoracic Twelve - Thoracic Twelve-Lumbar One - Lumbar One-Lumbar Two - Lumbar Two-Lumbar Three with instrumentaion and PLA;  Surgeon: Kary Kos, MD;  Location: [place];  Service: Neurosurgery;  Laterality: N/A;  Posterior Lateral Fusion - Thoracic Eleven-Thoracic Twelve - Thoracic Twelve-Lumbar One - Lumbar One-Lumbar Two - Lumbar Two-Lumbar Thre   LIVER BIOPSY N/A 07/06/2019   Procedure: LIVER BIOPSY;  Surgeon: Virl Cagey, MD;  [facility]: [facility];  Service: General;  Laterality: N/A;   LUMBAR PERCUTANEOUS PEDICLE SCREW 1 LEVEL N/A 04/05/2017   Procedure: LUMBAR PERCUTANEOUS PEDICLE SCREW LUMBAR THREE-FOUR;  Surgeon: Kary Kos, MD;  Location: [place];  Service: Neurosurgery;  Laterality: N/A;   ORIF CALCANEOUS FRACTURE Right 09/19/2014   Procedure: OPEN REDUCTION INTERNAL FIXATION (ORIF) CALCANEOUS FRACTURE;  Surgeon: Newt Minion, MD;  Location: [place];  Service: Orthopedics;  Laterality: Right;   ORIF CALCANEOUS FRACTURE Left  09/19/2014   Procedure: OPEN REDUCTION INTERNAL FIXATION (ORIF) CALCANEOUS FRACTURE;  Surgeon: Newt Minion, MD;  Location: [place];  Service: Orthopedics;  Laterality: Left;   POLYPECTOMY  06/05/2019   Procedure: POLYPECTOMY;  Surgeon: Rogene Houston, MD;  [facility];  Service: Endoscopy;;  cecal polyp biopsy forcep, ascending polyp cold snare    There were no vitals filed for this visit.   Subjective Assessment - 05/03/20 1015    Subjective Prosthetist added pads to socket last week and helped some. He has done knee to chest stretch.    Pertinent History Right TTA, Posterior Lateral Fusion T11-L3 09/2018, OA LBP & left knee, anxiety, depression    Limitations Standing;Walking;House hold activities    How long can you walk comfortably? 8/10 first walking then settles then 6/10. 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 Yes    Pain Score 7     Pain Location Leg  residual limb   Pain Orientation Right;Anterior    Pain Descriptors / Indicators Aching;Sore;Stabbing    Pain Type Other (Comment)   prosthetic   Pain Onset 1 to 4 weeks ago    Pain Frequency Intermittent    Aggravating Factors  weight bearing on prosthesis    Pain Relieving Factors taking prosthesis off    Pain Score 9    Pain Location Back    Pain Orientation Mid;Lower    Pain Descriptors / Indicators Stabbing;Sharp    Pain Type Chronic pain    Pain Onset More than a month ago    Aggravating Factors  extension activities    Pain Relieving Factors flexion    Pain Onset More than a month ago                             [facility] Adult PT Treatment/Exercise - 05/03/20 1015      Transfers   Transfers Sit to Stand;Stand to Sit;Floor to Transfer    Sit to Stand 5: Supervision;With upper extremity assist;With armrests;From chair/3-in-1    Sit to Stand Details Visual cues/gestures for  sequencing;Verbal cues for sequencing;Verbal cues for technique    Sit to Stand Details (indicate cue type and reason) light support on seat bottom    Stand to Sit 5: Supervision;With upper extremity assist;With armrests;To chair/3-in-1    Stand to Sit Details (indicate cue type and reason) Visual cues/gestures for sequencing;Verbal cues for sequencing;Verbal cues for technique    Stand to Sit Details light support on seat bottom      Ambulation/Gait   Ambulation/Gait Yes    Ambulation/Gait Assistance 5: Supervision    Ambulation/Gait Assistance Details verbal cues on upright posture    Ambulation Distance (Feet) 200 Feet   200' X 2   Assistive device Prosthesis;None    Gait Pattern Step-through pattern;Decreased stance time - right;Decreased step length - left;Decreased hip/knee flexion - right;Decreased weight shift to right;Right hip hike;Right foot flat;Right flexed knee in stance;Left flexed knee in stance;Antalgic;Lateral hip instability;Trunk flexed;Abducted- right    Ambulation Surface Level;Indoor      Neuro Re-ed    Neuro Re-ed Details  balance training in parallel bars with intermittent touch using mirror for visual feedback:  crossways on foam beam hip width stance - static up to 30 seconds without touch, head turns right/left & up/down touching with most movements;  standing tandem 60 seconds with prosthesis forward & back with intermittent touch.        Lumbar Exercises: Stretches   Single Knee to Chest Stretch Right;Left;2 reps;20 seconds    Single Knee to Chest Stretch Limitations verbal cues on technique    Double Knee to Chest Stretch 2 reps;20 seconds    Double Knee to Chest Stretch Limitations verbal cues on technique    Lower Trunk Rotation 2 reps;20 seconds    Lower Trunk Rotation Limitations verbal cues on technique      Lumbar Exercises: Seated   Other Seated Lumbar Exercises seated on large Swiss ball rolling anterior/posterior & right/left using pelvic motions &  upright posture      Knee/Hip Exercises: Stretches   Other Knee/Hip Stretches --    Other Knee/Hip Stretches standing with posterior pelvis to door frame with feet equal stance hip width - reaching single UE overhead & BUEs overhead - 2 deep breathes hold 2 reps ea.   Pt verbalized & return demo understanding as HEP  Knee/Hip Exercises: Standing   Forward Step Up Right;10 reps;Step Height: 4";Hand Hold: 0    Forward Step Up Limitations intermittent touch on //bar for balance    Step Down Right;5 reps;Step Height: 4";Hand Hold: 0    Step Down Limitations prosthetic toe over edge of step to enable knee flexion.     Other Standing Knee Exercises sit to/from stand from 24" bar stool without UE support with demo & verbal cues on technique 5 reps 2 sets      Prosthetics   Prosthetic Care Comments  PT reviewed use of antiperspirant to control sweating & use of stockinette proximal liner to wick sweat.   PT reviewed adjusting ply socks with pretibial pads.      Current prosthetic wear tolerance (days/week)  daily    Current prosthetic wear tolerance (#hours/day)  6 hours in am & 4 hours in pm - liner whole time and removes prosthesis for 15 minutes when painful.      Current prosthetic weight-bearing tolerance (hours/day)  Patient tolerates standing for 5-15 minutes with partial weight on prosthesis with limb pain 8-9/10.      Edema pitting edema    Residual limb condition  no open areas, redness over tibial tubercle but none on crest, good hair growth    Education Provided Skin check;Residual limb care;Correct ply sock adjustment;Proper wear schedule/adjustment;Proper weight-bearing schedule/adjustment;Other (comment)   see prosthetic care comments   Person(s) Educated Patient    Education Method Explanation;Verbal cues    Education Method Verbalized understanding;Verbal cues required;Needs further instruction    Donning Prosthesis Modified independent (device/increased time)    Doffing  Prosthesis Modified independent (device/increased time)                    PT Short Term Goals - 04/19/20 1603      PT SHORT TERM GOAL #1   Title Patient demonstrates proper donning & verbalizes general understanding of adjusting ply socks. (All STGs Target Date: 05/13/2020)    Time 4    Period Weeks    Status On-going    Target Date 05/13/20      PT SHORT TERM GOAL #2   Title Patient tolerates wear of prosthetic liner >8hrs /day and prosthesis >4 of those hours.    Time 4    Period Weeks    Status On-going    Target Date 05/13/20      PT SHORT TERM GOAL #3   Title Patient reports residual limb pain </=7/10 with wear & standing.    Time 4    Period Weeks    Status On-going    Target Date 05/13/20      PT SHORT TERM GOAL #4   Title Patient able to pick up items from floor without UE support.    Time 4    Period Weeks    Status On-going    Target Date 05/13/20      PT SHORT TERM GOAL #5   Title Patient ambulates 100' with cane & prosthesis with supervision.    Time 4    Period Weeks    Status On-going    Target Date 05/13/20             PT Long Term Goals - 04/19/20 1604      PT LONG TERM GOAL #1   Title Patient verbalizes & demonstrates understanding of proper prosthetic care to enable safe use of prosthesis. (All LTGs Target Date: 07/08/2020)    Time 12  Period Weeks    Status On-going    Target Date 07/08/20      PT LONG TERM GOAL #2   Title Patient tolerates prosthesis wear >90% of awake hours without skin issues & residual limb pain </=4/10.    Time 12    Period Weeks    Status On-going    Target Date 07/08/20      PT LONG TERM GOAL #3   Title Berg Balance >45/56 indicating lower fall risk.    Time 12    Period Weeks    Status On-going    Target Date 07/08/20      PT LONG TERM GOAL #4   Title Patient ambulates >1000' outdoors including grass with cane or less & prosthesis modified independent.    Time 12    Period Weeks    Status  On-going    Target Date 07/08/20      PT LONG TERM GOAL #5   Title Patient negotiates ramps, curbs & stairs with cane or less & prosthesis modified independent.    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    Time 12    Period Weeks    Status On-going    Target Date 07/08/20      PT LONG TERM GOAL #7   Title Patient reports above tasks increases his chronic back & knee pain <2 increments on 0-10 scale.    Time 12    Period Weeks    Status On-going    Target Date 07/08/20                 Plan - 05/03/20 1252    Clinical Impression Statement PT instructed in lumbar exercises to decrease back pain and patient appears to have general understanding.  Patient's limb had less redness along tibial crest with pretibial pads in socket.  He improved sit to/from stand with instruction in proper technique.    Personal Factors and Comorbidities Comorbidity 3+;Fitness;Past/Current Experience;Time since onset of injury/illness/exacerbation    Comorbidities Right TTA, Posterior Lateral Fusion T11-L3 09/2018, OA LBP & left knee, anxiety, depression    Examination-Activity Limitations Lift;Locomotion Level;Squat;Stairs;Stand;Transfers    Examination-Participation Restrictions Community Activity;Yard Work;Other   farm tasks   Stability/Clinical Decision Making Evolving/Moderate complexity    Rehab Potential Good    PT Frequency 2x / week    PT Duration 12 weeks    PT Treatment/Interventions ADLs/Self Care Home Management;DME Instruction;Moist Heat;Electrical Stimulation;Gait training;Stair training;Functional mobility training;Therapeutic activities;Therapeutic exercise;Balance training;Neuromuscular re-education;Patient/family education;Prosthetic Training;Manual techniques;Scar mobilization;Passive range of  motion;Dry needling;Vestibular    PT Next Visit Plan assess back pain & if exercises are helping, review prosthetic care, prosthetic gait training & balance activities    Consulted and Agree with Plan of Care Patient           Patient will benefit from skilled therapeutic intervention in order to improve the following deficits and impairments:  Abnormal gait, Decreased activity tolerance, Decreased balance, Decreased endurance, Decreased knowledge of use of DME, Decreased mobility, Decreased range of motion, Decreased skin integrity, Decreased scar mobility, Decreased strength, Impaired flexibility, Postural dysfunction, Prosthetic Dependency, Pain  Visit Diagnosis: Other abnormalities of gait and mobility  Unsteadiness on feet  Pain in right lower leg  Chronic pain of left  knee  Chronic midline low back pain without sciatica  Stiffness of right knee, not elsewhere classified  Muscle weakness (generalized)  Stiffness of left knee, not elsewhere classified     Problem List Patient Active Problem List   Diagnosis Date Noted   Acute hematogenous osteomyelitis of right foot ([place]) 01/29/2020   Post-traumatic arthritis of ankle, right    Cutaneous abscess of right ankle    Subacute osteomyelitis of right tibia ([place])    Fatty liver 06/30/2019   Calculus of gallbladder without cholecystitis without obstruction 06/30/2019   Nausea without vomiting 05/13/2019   Abdominal bloating 05/13/2019   Wound infection following procedure 12/19/2018   Malunion of joint fusion ([place]) 11/26/2018   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[place])    Status post lumbar spinal fusion 09/10/2018   S/P ankle fusion 11/27/2017   Nonunion of subtalar arthrodesis    Avascular necrosis of talus, right ([place])    Post-traumatic osteoarthritis, left ankle and foot 10/22/2017   DDD (degenerative disc disease), lumbar 04/05/2017   Fracture of L2 vertebra ([place]) 01/12/2015   Acute osteomyelitis of calcaneum  ([place]) 10/09/2014   Dysuria 10/05/2014   Cellulitis 10/05/2014   Fall from ladder 09/21/2014   L2 vertebral fracture ([place]) 09/21/2014   Bilateral calcaneal fractures 09/21/2014   Chronic pain 09/21/2014   Acute blood loss anemia 09/21/2014   Open fracture of both calcanei 09/15/2014    Jamey Reas PT, DPT 05/03/2020, 9:52 PM  [facility] [address] [place], [place], [address] Phone: [phone]   Fax:  [phone]  Name: Bradley Espinoza MRN: [id number] Date of Birth: 1956/09/23

## 2020-05-03 NOTE — Patient Instructions (Signed)
Access Code: [email or web address] URL: [email or web address] Date: 05/03/2020 Prepared by: Jamey Reas  Exercises Seated Hamstring Stretch - 2 x daily - 7 x weekly - 3 reps - 1 sets - 30 seconds hold Seated Gastroc Stretch with Strap - 2 x daily - 7 x weekly - 3 reps - 1 sets - 30 seconds hold Seated Hamstring Stretch with Strap - 2 x daily - 7 x weekly - 3 reps - 1 sets - 30 seconds hold Supine Single Knee to Chest Stretch - 3-5 x daily - 7 x weekly - 1 sets - 2-3 reps - 30 seconds hold Supine Double Knee to Chest - 3-5 x daily - 7 x weekly - 1 sets - 2-3 reps - 30 seconds hold Supine Lower Trunk Rotation - 3-5 x daily - 7 x weekly - 1 sets - 2-3 reps - 30 seconds hold Correct Standing Posture - 3-5 x daily - 5 x weekly - 1 sets - 2 reps - 5 seconds hold Upright Stance at Door Frame with Both Arms - 3-5 x daily - 5 x weekly - 1 sets - 2 reps - 5 seconds hold

## 2020-05-05 ENCOUNTER — Other Ambulatory Visit: Payer: Self-pay

## 2020-05-05 ENCOUNTER — Ambulatory Visit (INDEPENDENT_AMBULATORY_CARE_PROVIDER_SITE_OTHER): Payer: PPO | Admitting: Physical Therapy

## 2020-05-05 ENCOUNTER — Encounter: Payer: Self-pay | Admitting: Physical Therapy

## 2020-05-05 DIAGNOSIS — R2689 Other abnormalities of gait and mobility: Secondary | ICD-10-CM | POA: Diagnosis not present

## 2020-05-05 DIAGNOSIS — M25661 Stiffness of right knee, not elsewhere classified: Secondary | ICD-10-CM

## 2020-05-05 DIAGNOSIS — M6281 Muscle weakness (generalized): Secondary | ICD-10-CM | POA: Diagnosis not present

## 2020-05-05 DIAGNOSIS — M79661 Pain in right lower leg: Secondary | ICD-10-CM | POA: Diagnosis not present

## 2020-05-05 DIAGNOSIS — M25562 Pain in left knee: Secondary | ICD-10-CM

## 2020-05-05 DIAGNOSIS — R2681 Unsteadiness on feet: Secondary | ICD-10-CM

## 2020-05-05 DIAGNOSIS — G8929 Other chronic pain: Secondary | ICD-10-CM | POA: Diagnosis not present

## 2020-05-05 DIAGNOSIS — M545 Low back pain: Secondary | ICD-10-CM | POA: Diagnosis not present

## 2020-05-05 NOTE — Therapy (Signed)
[facility] [address] [place], [place], [address] Phone: [phone]   Fax:  [phone]  Physical Therapy Treatment  Patient Details  Name: Bradley Espinoza MRN: [id number] Date of Birth: 08-May-1956 Referring Provider (PT): Meridee Score, MD   Encounter Date: 05/05/2020   PT End of Session - 05/05/20 1309    Visit Number 7    Number of Visits 25    Date for PT Re-Evaluation 07/13/20    Authorization Type [employer]    Authorization Time Period $15 co-pay    Progress Note Due on Visit 10    PT Start Time 1100    PT Stop Time 1140    PT Time Calculation (min) 40 min    Activity Tolerance Patient tolerated treatment well;Patient limited by pain    Behavior During Therapy [facility] for tasks assessed/performed           Past Medical History:  Diagnosis Date  . Anxiety   . Arthritis    low back pain, lumbar radiculopathy  . Depression   . Fatty liver   . Fracture    B/L ankles  . GERD (gastroesophageal reflux disease)   . Headache(784.0)    allergy related   . History of kidney stones    passed stones  . History of stomach ulcers   . Retained orthopedic hardware    failed retained hardware right foot  . Wears glasses   . Wears glasses   . Wears partial dentures    upper    Past Surgical History:  Procedure Laterality Date  . AMPUTATION Right 01/29/2020   Procedure: RIGHT BELOW KNEE AMPUTATION;  Surgeon: Newt Minion, MD;  Location: [place];  Service: Orthopedics;  Laterality: Right;  . ANKLE FUSION Right 05/11/2015   Procedure: Right Posterior Arthroscopic Subtalar Arthrodesis;  Surgeon: Newt Minion, MD;  Location: [place];  Service: Orthopedics;  Laterality: Right;  . ANKLE FUSION Right 11/27/2017   Procedure: RIGHT TIBIOCALCANEAL FUSION;  Surgeon: Newt Minion, MD;  Location: [place];  Service: Orthopedics;  Laterality: Right;  . ANKLE FUSION Right 11/26/2018   Procedure: RIGHT ANTERIOR ANKLE FUSION, APPLY VAC;   Surgeon: Newt Minion, MD;  Location: [place];  Service: Orthopedics;  Laterality: Right;  . ANKLE FUSION Right 09/09/2019   Procedure: REVISION RIGHT ANKLE FUSION;  Surgeon: Newt Minion, MD;  Location: [place];  Service: Orthopedics;  Laterality: Right;  . ANTERIOR LAT LUMBAR FUSION N/A 04/05/2017   Procedure: Extreme Lateral Interbody Fusion - Lumbar three-lumbar four ,exploration of fusion Posterior augmentation with globus addition Removal hardware Lumbar one-three. Lumbar four-sacral one,  Removal internal bone growth stimulator;  Surgeon: Kary Kos, MD;  Location: [place];  Service: Neurosurgery;  Laterality: N/A;  . APPLICATION OF WOUND VAC  12/23/2018   Procedure: Application Of Wound Vac;  Surgeon: Newt Minion, MD;  Location: [place];  Service: Orthopedics;;  . BACK SURGERY  2004   x 2  . BIOPSY  06/05/2019   Procedure: BIOPSY;  Surgeon: Rogene Houston, MD;  [facility];  Service: Endoscopy;;  gastric biopsy  . CERVICAL SPINE SURGERY  2008  . CHOLECYSTECTOMY N/A 07/06/2019   Procedure: LAPAROSCOPIC CHOLECYSTECTOMY;  Surgeon: Virl Cagey, MD;  [facility]: [facility];  Service: General;  Laterality: N/A;  . COLONOSCOPY WITH PROPOFOL N/A 06/05/2019   Procedure: COLONOSCOPY WITH PROPOFOL;  Surgeon: Rogene Houston, MD;  [facility];  Service: Endoscopy;  Laterality: N/A;  .  ESOPHAGOGASTRODUODENOSCOPY    . ESOPHAGOGASTRODUODENOSCOPY (EGD) WITH PROPOFOL N/A 06/05/2019   Procedure: ESOPHAGOGASTRODUODENOSCOPY (EGD) WITH PROPOFOL;  Surgeon: Rogene Houston, MD;  [facility];  Service: Endoscopy;  Laterality: N/A;  1220pm  . HARDWARE REMOVAL Right 10/09/2014   Procedure: Removal Deep Hardware, Irrigation and Debridement Calcaneus, Place Antibiotic Beads and Wound VAC ;  Surgeon: Newt Minion, MD;  Location: [place];  Service: Orthopedics;  Laterality: Right;  . HARDWARE REMOVAL Right 08/12/2015   Procedure: Removal Deep Hardware Right Foot;  Surgeon: Newt Minion,  MD;  Location: [place];  Service: Orthopedics;  Laterality: Right;  . HARDWARE REMOVAL Right 11/26/2018   Procedure: REMOVAL RIGHT TIBIOCALCANEAL NAIL;  Surgeon: Newt Minion, MD;  Location: [place];  Service: Orthopedics;  Laterality: Right;  . HARDWARE REMOVAL Right 12/23/2018   Procedure: RIGHT ANKLE REMOVE HARDWARE, PLACE ANTIBIOTIC BEADS and placement of wound vac;  Surgeon: Newt Minion, MD;  Location: [place];  Service: Orthopedics;  Laterality: Right;  . HARDWARE REMOVAL Right 09/29/2019   Procedure: RIGHT ANKLE REMOVE HARDWARE, DEBRIDEMENT;  Surgeon: Newt Minion, MD;  Location: [place];  Service: Orthopedics;  Laterality: Right;  . I & D EXTREMITY Right 09/15/2014   Procedure: IRRIGATION AND DEBRIDEMENT Ankle;  Surgeon: Renette Butters, MD;  Location: [place];  Service: Orthopedics;  Laterality: Right;  . INGUINAL HERNIA REPAIR Bilateral   . LAMINECTOMY WITH POSTERIOR LATERAL ARTHRODESIS LEVEL 4 N/A 09/10/2018   Procedure: Posterior Lateral Fusion - Thoracic Eleven-Thoracic Twelve - Thoracic Twelve-Lumbar One - Lumbar One-Lumbar Two - Lumbar Two-Lumbar Three with instrumentaion and PLA;  Surgeon: Kary Kos, MD;  Location: [place];  Service: Neurosurgery;  Laterality: N/A;  Posterior Lateral Fusion - Thoracic Eleven-Thoracic Twelve - Thoracic Twelve-Lumbar One - Lumbar One-Lumbar Two - Lumbar Two-Lumbar Thre  . LIVER BIOPSY N/A 07/06/2019   Procedure: LIVER BIOPSY;  Surgeon: Virl Cagey, MD;  [facility];  Service: General;  Laterality: N/A;  . LUMBAR PERCUTANEOUS PEDICLE SCREW 1 LEVEL N/A 04/05/2017   Procedure: LUMBAR PERCUTANEOUS PEDICLE SCREW LUMBAR THREE-FOUR;  Surgeon: Kary Kos, MD;  Location: [place];  Service: Neurosurgery;  Laterality: N/A;  . ORIF CALCANEOUS FRACTURE Right 09/19/2014   Procedure: OPEN REDUCTION INTERNAL FIXATION (ORIF) CALCANEOUS FRACTURE;  Surgeon: Newt Minion, MD;  Location: [place];  Service: Orthopedics;  Laterality: Right;  . ORIF CALCANEOUS FRACTURE Left  09/19/2014   Procedure: OPEN REDUCTION INTERNAL FIXATION (ORIF) CALCANEOUS FRACTURE;  Surgeon: Newt Minion, MD;  Location: [place];  Service: Orthopedics;  Laterality: Left;  . POLYPECTOMY  06/05/2019   Procedure: POLYPECTOMY;  Surgeon: Rogene Houston, MD;  [facility];  Service: Endoscopy;;  cecal polyp biopsy forcep, ascending polyp cold snare    There were no vitals filed for this visit.   Subjective Assessment - 05/05/20 1150    Subjective he says the stretches help his back some, he jus knows he needs to consistently work on his exercises. Still with severe back pain today overall.    Pertinent History Right TTA, Posterior Lateral Fusion T11-L3 09/2018, OA LBP & left knee, anxiety, depression    Limitations Standing;Walking;House hold activities    How long can you walk comfortably? 8/10 first walking then settles then 6/10. After wear couple hours back up to 8/10    Patient Stated Goals To return to work in [employer], use prosthesis to be active    Pain Onset 1 to 4 weeks ago  Pain Onset More than a month ago    Pain Onset More than a month ago              [facility] PT Assessment - 05/05/20 0001      Assessment   Medical Diagnosis Right Transtibial Amputation    Referring Provider (PT) Meridee Score, MD    Onset Date/Surgical Date 04/07/20      Special Tests   Other special tests neg SLR test, negative for disc pathology, neg quadrant test for facet arthropathy, + slump test for LE neural tension, he did appear to prefer flexion over extension             [facility] Adult PT Treatment/Exercise - 05/05/20 0001      Transfers   Transfers Sit to Stand;Stand to Sit;Floor to Transfer    Sit to Stand 5: Supervision;With upper extremity assist;With armrests;From chair/3-in-1    Comments sit to stands no UE support from raised mat surface 25 inches 2X10 reps      Ambulation/Gait   Ambulation/Gait --    Ambulation/Gait Assistance 5: Supervision    Ambulation  Distance (Feet) 200 Feet    Assistive device Prosthesis;None    Gait Pattern Step-through pattern;Decreased stance time - right;Decreased step length - left;Decreased hip/knee flexion - right;Decreased weight shift to right;Right hip hike;Right foot flat;Right flexed knee in stance;Left flexed knee in stance;Antalgic;Lateral hip instability;Trunk flexed;Abducted- right      Exercises   Exercises Lumbar    Other Exercises  step ups onto 4 inch step 2X10 reps bilat      Lumbar Exercises: Stretches   Single Knee to Chest Stretch Right;Left;2 reps;30 seconds    Double Knee to Chest Stretch 10 seconds    Double Knee to Chest Stretch Limitations 10 reps with feet on pball    Lower Trunk Rotation 5 reps;10 seconds    Other Lumbar Stretch Exercise seated flexion stretch rolling out pball 10 sec X 10 reps      Lumbar Exercises: Supine   Bridge Limitations 2 sets of 10 reps in gentle pain free ROM    Straight Leg Raise 10 reps    Other Supine Lumbar Exercises self lumbar traction gentle distraction using dow across hips 20 sec X 5 reps                  PT Education - 05/05/20 1308    Education Details added lumbar exercises into HEP due to severe back pain    Person(s) Educated Patient    Methods Explanation;Demonstration    Comprehension Verbalized understanding;Returned demonstration            PT Short Term Goals - 04/19/20 1603      PT SHORT TERM GOAL #1   Title Patient demonstrates proper donning & verbalizes general understanding of adjusting ply socks. (All STGs Target Date: 05/13/2020)    Time 4    Period Weeks    Status On-going    Target Date 05/13/20      PT SHORT TERM GOAL #2   Title Patient tolerates wear of prosthetic liner >8hrs /day and prosthesis >4 of those hours.    Time 4    Period Weeks    Status On-going    Target Date 05/13/20      PT SHORT TERM GOAL #3   Title Patient reports residual limb pain </=7/10 with wear & standing.    Time 4    Period  Weeks    Status On-going  Target Date 05/13/20      PT SHORT TERM GOAL #4   Title Patient able to pick up items from floor without UE support.    Time 4    Period Weeks    Status On-going    Target Date 05/13/20      PT SHORT TERM GOAL #5   Title Patient ambulates 100' with cane & prosthesis with supervision.    Time 4    Period Weeks    Status On-going    Target Date 05/13/20             PT Long Term Goals - 04/19/20 1604      PT LONG TERM GOAL #1   Title Patient verbalizes & demonstrates understanding of proper prosthetic care to enable safe use of prosthesis. (All LTGs Target Date: 07/08/2020)    Time 12    Period Weeks    Status On-going    Target Date 07/08/20      PT LONG TERM GOAL #2   Title Patient tolerates prosthesis wear >90% of awake hours without skin issues & residual limb pain </=4/10.    Time 12    Period Weeks    Status On-going    Target Date 07/08/20      PT LONG TERM GOAL #3   Title Berg Balance >45/56 indicating lower fall risk.    Time 12    Period Weeks    Status On-going    Target Date 07/08/20      PT LONG TERM GOAL #4   Title Patient ambulates >1000' outdoors including grass with cane or less & prosthesis modified independent.    Time 12    Period Weeks    Status On-going    Target Date 07/08/20      PT LONG TERM GOAL #5   Title Patient negotiates ramps, curbs & stairs with cane or less & prosthesis modified independent.    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    Time 12    Period Weeks    Status On-going    Target Date 07/08/20      PT LONG TERM GOAL #7   Title Patient reports above tasks increases his chronic back & knee pain <2 increments on 0-10 scale.    Time 12    Period Weeks    Status On-going    Target Date 07/08/20                   Plan - 05/05/20 1310    Clinical Impression Statement Session focused on further examination of his severe LBP. This is likely due to OA, spinal hypomobility, chronic post surgical pain, poor posture, and very tight lumbar paraspinals. No radiculopathy was provoked today and special testing mostly negative. He was given a few extra lumbar exercises to try into his HEP and was encouraged to perform consistent stretching in order to reduce his overall pain and stiffness. Rest of session focued on functional leg strengthening as tolreated. He will see prothetist today for revision he reports.    Personal Factors and Comorbidities Comorbidity 3+;Fitness;Past/Current Experience;Time since onset of injury/illness/exacerbation    Comorbidities Right TTA, Posterior Lateral Fusion T11-L3 09/2018, OA LBP & left knee, anxiety, depression  Examination-Activity Limitations Lift;Locomotion Level;Squat;Stairs;Stand;Transfers    Examination-Participation Restrictions Community Activity;Yard Work;Other   farm tasks   Stability/Clinical Decision Making Evolving/Moderate complexity    Rehab Potential Good    PT Frequency 2x / week    PT Duration 12 weeks    PT Treatment/Interventions ADLs/Self Care Home Management;DME Instruction;Moist Heat;Electrical Stimulation;Gait training;Stair training;Functional mobility training;Therapeutic activities;Therapeutic exercise;Balance training;Neuromuscular re-education;Patient/family education;Prosthetic Training;Manual techniques;Scar mobilization;Passive range of motion;Dry needling;Vestibular    PT Next Visit Plan assess back pain & if exercises are helping, review prosthetic care, prosthetic gait training & balance activities    Consulted and Agree with Plan of Care Patient           Patient will benefit from skilled therapeutic intervention in order to improve the following deficits and impairments:  Abnormal gait, Decreased activity tolerance, Decreased  balance, Decreased endurance, Decreased knowledge of use of DME, Decreased mobility, Decreased range of motion, Decreased skin integrity, Decreased scar mobility, Decreased strength, Impaired flexibility, Postural dysfunction, Prosthetic Dependency, Pain  Visit Diagnosis: Other abnormalities of gait and mobility  Unsteadiness on feet  Pain in right lower leg  Chronic pain of left knee  Chronic midline low back pain without sciatica  Stiffness of right knee, not elsewhere classified  Muscle weakness (generalized)     Problem List Patient Active Problem List   Diagnosis Date Noted  . Acute hematogenous osteomyelitis of right foot ([place]) 01/29/2020  . Post-traumatic arthritis of ankle, right   . Cutaneous abscess of right ankle   . Subacute osteomyelitis of right tibia ([place])   . Fatty liver 06/30/2019  . Calculus of gallbladder without cholecystitis without obstruction 06/30/2019  . Nausea without vomiting 05/13/2019  . Abdominal bloating 05/13/2019  . Wound infection following procedure 12/19/2018  . Malunion of joint fusion ([place]) 11/26/2018  . 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[facility])   . Status post lumbar spinal fusion 09/10/2018  . S/P ankle fusion 11/27/2017  . Nonunion of subtalar arthrodesis   . Avascular necrosis of talus, right ([place])   . Post-traumatic osteoarthritis, left ankle and foot 10/22/2017  . DDD (degenerative disc disease), lumbar 04/05/2017  . Fracture of L2 vertebra ([place]) 01/12/2015  . Acute osteomyelitis of calcaneum ([place]) 10/09/2014  . Dysuria 10/05/2014  . Cellulitis 10/05/2014  . Fall from ladder 09/21/2014  . L2 vertebral fracture ([place]) 09/21/2014  . Bilateral calcaneal fractures 09/21/2014  . Chronic pain 09/21/2014  . Acute blood loss anemia 09/21/2014  . Open fracture of both calcanei 09/15/2014    Silvestre Mesi 05/05/2020, 1:13 PM  [facility] [address] [place], [place], [address] Phone: [phone]    Fax:  [phone]  Name: SARP VERNIER MRN: [id number] Date of Birth: [birth year]

## 2020-05-10 ENCOUNTER — Other Ambulatory Visit: Payer: Self-pay

## 2020-05-10 ENCOUNTER — Ambulatory Visit (INDEPENDENT_AMBULATORY_CARE_PROVIDER_SITE_OTHER): Payer: PPO | Admitting: Physical Therapy

## 2020-05-10 ENCOUNTER — Encounter: Payer: Self-pay | Admitting: Physical Therapy

## 2020-05-10 DIAGNOSIS — R2689 Other abnormalities of gait and mobility: Secondary | ICD-10-CM

## 2020-05-10 DIAGNOSIS — M6281 Muscle weakness (generalized): Secondary | ICD-10-CM | POA: Diagnosis not present

## 2020-05-10 DIAGNOSIS — M545 Low back pain: Secondary | ICD-10-CM

## 2020-05-10 DIAGNOSIS — M25661 Stiffness of right knee, not elsewhere classified: Secondary | ICD-10-CM

## 2020-05-10 DIAGNOSIS — R2681 Unsteadiness on feet: Secondary | ICD-10-CM | POA: Diagnosis not present

## 2020-05-10 DIAGNOSIS — M25562 Pain in left knee: Secondary | ICD-10-CM

## 2020-05-10 DIAGNOSIS — G8929 Other chronic pain: Secondary | ICD-10-CM

## 2020-05-10 DIAGNOSIS — M79661 Pain in right lower leg: Secondary | ICD-10-CM

## 2020-05-10 NOTE — Therapy (Signed)
[facility] Therapy [address] [place], [place], [address] Phone: [phone]   Fax:  [phone]  Physical Therapy Treatment  Patient Details  Name: Bradley Espinoza MRN: [id number] Date of Birth: April 16, 1956 Referring Provider (PT): Meridee Score, MD   Encounter Date: 05/10/2020   PT End of Session - 05/10/20 1004    Visit Number 8    Number of Visits 25    Date for PT Re-Evaluation 07/13/20    Authorization Type [employer] P[employer]    Authorization Time Period $15 co-pay    Progress Note Due on Visit 10    PT Start Time 0930    PT Stop Time 1010    PT Time Calculation (min) 40 min    Activity Tolerance Patient tolerated treatment well;Patient limited by pain    Behavior During Therapy [facility] for tasks assessed/performed           Past Medical History:  Diagnosis Date  . Anxiety   . Arthritis    low back pain, lumbar radiculopathy  . Depression   . Fatty liver   . Fracture    B/L ankles  . GERD (gastroesophageal reflux disease)   . Headache(784.0)    allergy related   . History of kidney stones    passed stones  . History of stomach ulcers   . Retained orthopedic hardware    failed retained hardware right foot  . Wears glasses   . Wears glasses   . Wears partial dentures    upper    Past Surgical History:  Procedure Laterality Date  . AMPUTATION Right 01/29/2020   Procedure: RIGHT BELOW KNEE AMPUTATION;  Surgeon: Newt Minion, MD;  Location: [place];  Service: Orthopedics;  Laterality: Right;  . ANKLE FUSION Right 05/11/2015   Procedure: Right Posterior Arthroscopic Subtalar Arthrodesis;  Surgeon: Newt Minion, MD;  Location: [place];  Service: Orthopedics;  Laterality: Right;  . ANKLE FUSION Right 11/27/2017   Procedure: RIGHT TIBIOCALCANEAL FUSION;  Surgeon: Newt Minion, MD;  Location: [place];  Service: Orthopedics;  Laterality: Right;  . ANKLE FUSION Right 11/26/2018   Procedure: RIGHT ANTERIOR ANKLE FUSION, APPLY VAC;   Surgeon: Newt Minion, MD;  Location: [place];  Service: Orthopedics;  Laterality: Right;  . ANKLE FUSION Right 09/09/2019   Procedure: REVISION RIGHT ANKLE FUSION;  Surgeon: Newt Minion, MD;  Location: [place];  Service: Orthopedics;  Laterality: Right;  . ANTERIOR LAT LUMBAR FUSION N/A 04/05/2017   Procedure: Extreme Lateral Interbody Fusion - Lumbar three-lumbar four ,exploration of fusion Posterior augmentation with globus addition Removal hardware Lumbar one-three. Lumbar four-sacral one,  Removal internal bone growth stimulator;  Surgeon: Kary Kos, MD;  Location: [place];  Service: Neurosurgery;  Laterality: N/A;  . APPLICATION OF WOUND VAC  12/23/2018   Procedure: Application Of Wound Vac;  Surgeon: Newt Minion, MD;  Location: [place];  Service: Orthopedics;;  . BACK SURGERY  2004   x 2  . BIOPSY  06/05/2019   Procedure: BIOPSY;  Surgeon: Rogene Houston, MD;  [facility];  Service: Endoscopy;;  gastric biopsy  . CERVICAL SPINE SURGERY  2008  . CHOLECYSTECTOMY N/A 07/06/2019   Procedure: LAPAROSCOPIC CHOLECYSTECTOMY;  Surgeon: Virl Cagey, MD;  [facility]: [facility];  Service: General;  Laterality: N/A;  . COLONOSCOPY WITH PROPOFOL N/A 06/05/2019   Procedure: COLONOSCOPY WITH PROPOFOL;  Surgeon: Rogene Houston, MD;  [facility];  Service: Endoscopy;  Laterality: N/A;  .  ESOPHAGOGASTRODUODENOSCOPY    . ESOPHAGOGASTRODUODENOSCOPY (EGD) WITH PROPOFOL N/A 06/05/2019   Procedure: ESOPHAGOGASTRODUODENOSCOPY (EGD) WITH PROPOFOL;  Surgeon: Rogene Houston, MD;  [facility];  Service: Endoscopy;  Laterality: N/A;  1220pm  . HARDWARE REMOVAL Right 10/09/2014   Procedure: Removal Deep Hardware, Irrigation and Debridement Calcaneus, Place Antibiotic Beads and Wound VAC ;  Surgeon: Newt Minion, MD;  Location: [place];  Service: Orthopedics;  Laterality: Right;  . HARDWARE REMOVAL Right 08/12/2015   Procedure: Removal Deep Hardware Right Foot;  Surgeon: Newt Minion,  MD;  Location: [place];  Service: Orthopedics;  Laterality: Right;  . HARDWARE REMOVAL Right 11/26/2018   Procedure: REMOVAL RIGHT TIBIOCALCANEAL NAIL;  Surgeon: Newt Minion, MD;  Location: [place];  Service: Orthopedics;  Laterality: Right;  . HARDWARE REMOVAL Right 12/23/2018   Procedure: RIGHT ANKLE REMOVE HARDWARE, PLACE ANTIBIOTIC BEADS and placement of wound vac;  Surgeon: Newt Minion, MD;  Location: [place];  Service: Orthopedics;  Laterality: Right;  . HARDWARE REMOVAL Right 09/29/2019   Procedure: RIGHT ANKLE REMOVE HARDWARE, DEBRIDEMENT;  Surgeon: Newt Minion, MD;  Location: [place];  Service: Orthopedics;  Laterality: Right;  . I & D EXTREMITY Right 09/15/2014   Procedure: IRRIGATION AND DEBRIDEMENT Ankle;  Surgeon: Renette Butters, MD;  Location: [place];  Service: Orthopedics;  Laterality: Right;  . INGUINAL HERNIA REPAIR Bilateral   . LAMINECTOMY WITH POSTERIOR LATERAL ARTHRODESIS LEVEL 4 N/A 09/10/2018   Procedure: Posterior Lateral Fusion - Thoracic Eleven-Thoracic Twelve - Thoracic Twelve-Lumbar One - Lumbar One-Lumbar Two - Lumbar Two-Lumbar Three with instrumentaion and PLA;  Surgeon: Kary Kos, MD;  Location: [place];  Service: Neurosurgery;  Laterality: N/A;  Posterior Lateral Fusion - Thoracic Eleven-Thoracic Twelve - Thoracic Twelve-Lumbar One - Lumbar One-Lumbar Two - Lumbar Two-Lumbar Thre  . LIVER BIOPSY N/A 07/06/2019   Procedure: LIVER BIOPSY;  Surgeon: Virl Cagey, MD;  [facility]: [facility];  Service: General;  Laterality: N/A;  . LUMBAR PERCUTANEOUS PEDICLE SCREW 1 LEVEL N/A 04/05/2017   Procedure: LUMBAR PERCUTANEOUS PEDICLE SCREW LUMBAR THREE-FOUR;  Surgeon: Kary Kos, MD;  Location: [place];  Service: Neurosurgery;  Laterality: N/A;  . ORIF CALCANEOUS FRACTURE Right 09/19/2014   Procedure: OPEN REDUCTION INTERNAL FIXATION (ORIF) CALCANEOUS FRACTURE;  Surgeon: Newt Minion, MD;  Location: [place];  Service: Orthopedics;  Laterality: Right;  . ORIF CALCANEOUS FRACTURE Left  09/19/2014   Procedure: OPEN REDUCTION INTERNAL FIXATION (ORIF) CALCANEOUS FRACTURE;  Surgeon: Newt Minion, MD;  Location: [place];  Service: Orthopedics;  Laterality: Left;  . POLYPECTOMY  06/05/2019   Procedure: POLYPECTOMY;  Surgeon: Rogene Houston, MD;  [facility];  Service: Endoscopy;;  cecal polyp biopsy forcep, ascending polyp cold snare    There were no vitals filed for this visit.   Subjective Assessment - 05/10/20 0945    Subjective relays a lot of pain lately in his back and his Rt leg. Denies skin breakdown. Does say he is having difficulty figuring out how many socks he needs to use with prosthesis.    Pertinent History Right TTA, Posterior Lateral Fusion T11-L3 09/2018, OA LBP & left knee, anxiety, depression    Limitations Standing;Walking;House hold activities    How long can you walk comfortably? 8/10 first walking then settles then 6/10. After wear couple hours back up to 8/10    Patient Stated Goals To return to work in [employer], use prosthesis to be active    Pain Onset  1 to 4 weeks ago    Pain Onset More than a month ago    Pain Onset More than a month ago                             [facility] Adult PT Treatment/Exercise - 05/10/20 0001      Transfers   Transfers Sit to Stand;Stand to Sit;Floor to Transfer    Sit to Stand 5: Supervision;With upper extremity assist;With armrests;From chair/3-in-1    Stand to Sit 5: Supervision;With upper extremity assist;With armrests;To chair/3-in-1      Ambulation/Gait   Ambulation/Gait Assistance 5: Supervision    Ambulation Distance (Feet) 200 Feet    Assistive device Prosthesis;None    Gait Pattern Step-through pattern;Decreased stance time - right;Decreased step length - left;Decreased hip/knee flexion - right;Decreased weight shift to right;Right hip hike;Right foot flat;Right flexed knee in stance;Left flexed knee in stance;Antalgic;Lateral hip instability;Trunk flexed;Abducted- right       Lumbar Exercises: Stretches   Single Knee to Chest Stretch Right;Left;2 reps;30 seconds    Double Knee to Chest Stretch 10 seconds    Double Knee to Chest Stretch Limitations 10 reps with feet on pball    Lower Trunk Rotation 5 reps;10 seconds    Other Lumbar Stretch Exercise seated flexion stretch rolling out pball 10 sec X 10 reps      Lumbar Exercises: Seated   Other Seated Lumbar Exercises seated on large swiss ball for Rows and extensions green band X 20 reps ea      Lumbar Exercises: Supine   Bridge Limitations 10 reps holding 5 sec in gentle pain free ROM with feet on pball      Knee/Hip Exercises: Stretches   Other Knee/Hip Stretches standing with posterior pelvis to door frame with feet equal stance hip width - reaching single UE overhead & BUEs overhead - 2 deep breathes hold 2 reps ea.   Pt verbalized & return demo understanding as HEP      Knee/Hip Exercises: Machines for Strengthening   Cybex Leg Press Rt leg only 50 lbs 3X10 reps interspaced with 30 sec H.S. stretch on Rt. Then bilat push at 100 lbs 3X10 interspaced with Lt H.S. stretch                     PT Short Term Goals - 04/19/20 1603      PT SHORT TERM GOAL #1   Title Patient demonstrates proper donning & verbalizes general understanding of adjusting ply socks. (All STGs Target Date: 05/13/2020)    Time 4    Period Weeks    Status On-going    Target Date 05/13/20      PT SHORT TERM GOAL #2   Title Patient tolerates wear of prosthetic liner >8hrs /day and prosthesis >4 of those hours.    Time 4    Period Weeks    Status On-going    Target Date 05/13/20      PT SHORT TERM GOAL #3   Title Patient reports residual limb pain </=7/10 with wear & standing.    Time 4    Period Weeks    Status On-going    Target Date 05/13/20      PT SHORT TERM GOAL #4   Title Patient able to pick up items from floor without UE support.    Time 4    Period Weeks    Status On-going    Target  Date 05/13/20      PT  SHORT TERM GOAL #5   Title Patient ambulates 100' with cane & prosthesis with supervision.    Time 4    Period Weeks    Status On-going    Target Date 05/13/20             PT Long Term Goals - 04/19/20 1604      PT LONG TERM GOAL #1   Title Patient verbalizes & demonstrates understanding of proper prosthetic care to enable safe use of prosthesis. (All LTGs Target Date: 07/08/2020)    Time 12    Period Weeks    Status On-going    Target Date 07/08/20      PT LONG TERM GOAL #2   Title Patient tolerates prosthesis wear >90% of awake hours without skin issues & residual limb pain </=4/10.    Time 12    Period Weeks    Status On-going    Target Date 07/08/20      PT LONG TERM GOAL #3   Title Berg Balance >45/56 indicating lower fall risk.    Time 12    Period Weeks    Status On-going    Target Date 07/08/20      PT LONG TERM GOAL #4   Title Patient ambulates >1000' outdoors including grass with cane or less & prosthesis modified independent.    Time 12    Period Weeks    Status On-going    Target Date 07/08/20      PT LONG TERM GOAL #5   Title Patient negotiates ramps, curbs & stairs with cane or less & prosthesis modified independent.    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    Time 12    Period Weeks    Status On-going    Target Date 07/08/20      PT LONG TERM GOAL #7   Title Patient reports above tasks increases his chronic back & knee pain <2 increments on 0-10 scale.    Time 12    Period Weeks    Status On-going    Target Date 07/08/20                 Plan - 05/10/20 1008    Clinical Impression Statement Continued with lumbar stretching and Rt leg stretching/strengthening as tolerated. Did progress overall core strength today and he has good overall exercise  tolerance despite severe chronic pain levels. PT will continue to progress as able in efforts to maximize function.    Personal Factors and Comorbidities Comorbidity 3+;Fitness;Past/Current Experience;Time since onset of injury/illness/exacerbation    Comorbidities Right TTA, Posterior Lateral Fusion T11-L3 09/2018, OA LBP & left knee, anxiety, depression    Examination-Activity Limitations Lift;Locomotion Level;Squat;Stairs;Stand;Transfers    Examination-Participation Restrictions Community Activity;Yard Work;Other   farm tasks   Stability/Clinical Decision Making Evolving/Moderate complexity    Rehab Potential Good    PT Frequency 2x / week    PT Duration 12 weeks    PT Treatment/Interventions ADLs/Self Care Home Management;DME Instruction;Moist Heat;Electrical Stimulation;Gait training;Stair training;Functional mobility training;Therapeutic activities;Therapeutic exercise;Balance training;Neuromuscular re-education;Patient/family education;Prosthetic Training;Manual techniques;Scar mobilization;Passive range of motion;Dry needling;Vestibular    PT Next Visit Plan assess back pain & if exercises are helping, review prosthetic care,  prosthetic gait training & balance activities    Consulted and Agree with Plan of Care Patient           Patient will benefit from skilled therapeutic intervention in order to improve the following deficits and impairments:  Abnormal gait, Decreased activity tolerance, Decreased balance, Decreased endurance, Decreased knowledge of use of DME, Decreased mobility, Decreased range of motion, Decreased skin integrity, Decreased scar mobility, Decreased strength, Impaired flexibility, Postural dysfunction, Prosthetic Dependency, Pain  Visit Diagnosis: Other abnormalities of gait and mobility  Unsteadiness on feet  Pain in right lower leg  Chronic pain of left knee  Chronic midline low back pain without sciatica  Stiffness of right knee, not elsewhere  classified  Muscle weakness (generalized)     Problem List Patient Active Problem List   Diagnosis Date Noted  . Acute hematogenous osteomyelitis of right foot ([place]) 01/29/2020  . Post-traumatic arthritis of ankle, right   . Cutaneous abscess of right ankle   . Subacute osteomyelitis of right tibia ([place])   . Fatty liver 06/30/2019  . Calculus of gallbladder without cholecystitis without obstruction 06/30/2019  . Nausea without vomiting 05/13/2019  . Abdominal bloating 05/13/2019  . Wound infection following procedure 12/19/2018  . Malunion of joint fusion ([place]) 11/26/2018  . 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[facility])   . Status post lumbar spinal fusion 09/10/2018  . S/P ankle fusion 11/27/2017  . Nonunion of subtalar arthrodesis   . Avascular necrosis of talus, right ([place])   . Post-traumatic osteoarthritis, left ankle and foot 10/22/2017  . DDD (degenerative disc disease), lumbar 04/05/2017  . Fracture of L2 vertebra ([place]) 01/12/2015  . Acute osteomyelitis of calcaneum ([place]) 10/09/2014  . Dysuria 10/05/2014  . Cellulitis 10/05/2014  . Fall from ladder 09/21/2014  . L2 vertebral fracture ([place]) 09/21/2014  . Bilateral calcaneal fractures 09/21/2014  . Chronic pain 09/21/2014  . Acute blood loss anemia 09/21/2014  . Open fracture of both calcanei 09/15/2014    Silvestre Mesi 05/10/2020, 10:10 AM  [facility] Physical Therapy [address] [place], [place], [address] Phone: [phone]   Fax:  [phone]  Name: MICHEIL KLAUS MRN: [id number] Date of Birth: May 28, 1956

## 2020-05-12 ENCOUNTER — Encounter: Payer: PPO | Admitting: Rehabilitative and Restorative Service Providers"

## 2020-05-17 ENCOUNTER — Encounter: Payer: Self-pay | Admitting: Physical Therapy

## 2020-05-17 ENCOUNTER — Other Ambulatory Visit: Payer: Self-pay

## 2020-05-17 ENCOUNTER — Encounter: Payer: Self-pay | Admitting: Orthopedic Surgery

## 2020-05-17 ENCOUNTER — Telehealth: Payer: Self-pay | Admitting: Physical Therapy

## 2020-05-17 ENCOUNTER — Telehealth: Payer: Self-pay | Admitting: Orthopedic Surgery

## 2020-05-17 ENCOUNTER — Ambulatory Visit: Payer: PPO | Admitting: Physical Therapy

## 2020-05-17 DIAGNOSIS — M25661 Stiffness of right knee, not elsewhere classified: Secondary | ICD-10-CM

## 2020-05-17 DIAGNOSIS — M545 Low back pain, unspecified: Secondary | ICD-10-CM

## 2020-05-17 DIAGNOSIS — M79661 Pain in right lower leg: Secondary | ICD-10-CM | POA: Diagnosis not present

## 2020-05-17 DIAGNOSIS — M6281 Muscle weakness (generalized): Secondary | ICD-10-CM | POA: Diagnosis not present

## 2020-05-17 DIAGNOSIS — M25562 Pain in left knee: Secondary | ICD-10-CM | POA: Diagnosis not present

## 2020-05-17 DIAGNOSIS — G8929 Other chronic pain: Secondary | ICD-10-CM | POA: Diagnosis not present

## 2020-05-17 DIAGNOSIS — R2681 Unsteadiness on feet: Secondary | ICD-10-CM | POA: Diagnosis not present

## 2020-05-17 DIAGNOSIS — R2689 Other abnormalities of gait and mobility: Secondary | ICD-10-CM

## 2020-05-17 DIAGNOSIS — M25662 Stiffness of left knee, not elsewhere classified: Secondary | ICD-10-CM

## 2020-05-17 NOTE — Telephone Encounter (Signed)
Call patient and see if we can increase his neurontin to 3 times a day and then see him in the office

## 2020-05-17 NOTE — Telephone Encounter (Signed)
Thank you, I'll increase his neurontin and see how that helps

## 2020-05-17 NOTE — Telephone Encounter (Signed)
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 His biggest complaint is night pain or when not wearing prosthesis now.  Do you prescribe topical creams? Do you have any recommendations for care? Bradley Espinoza

## 2020-05-17 NOTE — Therapy (Signed)
[facility] [address] [place], [place], [address] Phone: [phone]   Fax:  [phone]  Physical Therapy Treatment  Patient Details  Name: Bradley Espinoza MRN: [id number] Date of Birth: Jun 16, 1956 Referring Provider (PT): Meridee Score, MD   Encounter Date: 05/17/2020   PT End of Session - 05/17/20 1010    Visit Number 9    Number of Visits 25    Date for PT Re-Evaluation 07/13/20    Authorization Type [employer]    Authorization Time Period $15 co-pay    Progress Note Due on Visit 10    PT Start Time 1010    PT Stop Time 1100    PT Time Calculation (min) 50 min    Activity Tolerance Patient tolerated treatment well;Patient limited by pain    Behavior During Therapy [facility] for tasks assessed/performed           Past Medical History:  Diagnosis Date  . Anxiety   . Arthritis    low back pain, lumbar radiculopathy  . Depression   . Fatty liver   . Fracture    B/L ankles  . GERD (gastroesophageal reflux disease)   . Headache(784.0)    allergy related   . History of kidney stones    passed stones  . History of stomach ulcers   . Retained orthopedic hardware    failed retained hardware right foot  . Wears glasses   . Wears glasses   . Wears partial dentures    upper    Past Surgical History:  Procedure Laterality Date  . AMPUTATION Right 01/29/2020   Procedure: RIGHT BELOW KNEE AMPUTATION;  Surgeon: Newt Minion, MD;  Location: [place];  Service: Orthopedics;  Laterality: Right;  . ANKLE FUSION Right 05/11/2015   Procedure: Right Posterior Arthroscopic Subtalar Arthrodesis;  Surgeon: Newt Minion, MD;  Location: [place];  Service: Orthopedics;  Laterality: Right;  . ANKLE FUSION Right 11/27/2017   Procedure: RIGHT TIBIOCALCANEAL FUSION;  Surgeon: Newt Minion, MD;  Location: [place];  Service: Orthopedics;  Laterality: Right;  . ANKLE FUSION Right 11/26/2018   Procedure: RIGHT ANTERIOR ANKLE FUSION, APPLY VAC;   Surgeon: Newt Minion, MD;  Location: [place];  Service: Orthopedics;  Laterality: Right;  . ANKLE FUSION Right 09/09/2019   Procedure: REVISION RIGHT ANKLE FUSION;  Surgeon: Newt Minion, MD;  Location: [place];  Service: Orthopedics;  Laterality: Right;  . ANTERIOR LAT LUMBAR FUSION N/A 04/05/2017   Procedure: Extreme Lateral Interbody Fusion - Lumbar three-lumbar four ,exploration of fusion Posterior augmentation with globus addition Removal hardware Lumbar one-three. Lumbar four-sacral one,  Removal internal bone growth stimulator;  Surgeon: Kary Kos, MD;  Location: [place];  Service: Neurosurgery;  Laterality: N/A;  . APPLICATION OF WOUND VAC  12/23/2018   Procedure: Application Of Wound Vac;  Surgeon: Newt Minion, MD;  Location: [place];  Service: Orthopedics;;  . BACK SURGERY  2004   x 2  . BIOPSY  06/05/2019   Procedure: BIOPSY;  Surgeon: Rogene Houston, MD;  [facility];  Service: Endoscopy;;  gastric biopsy  . CERVICAL SPINE SURGERY  2008  . CHOLECYSTECTOMY N/A 07/06/2019   Procedure: LAPAROSCOPIC CHOLECYSTECTOMY;  Surgeon: Virl Cagey, MD;  [facility];  Service: General;  Laterality: N/A;  . COLONOSCOPY WITH PROPOFOL N/A 06/05/2019   Procedure: COLONOSCOPY WITH PROPOFOL;  Surgeon: Rogene Houston, MD;  [facility];  Service: Endoscopy;  Laterality: N/A;  .  ESOPHAGOGASTRODUODENOSCOPY    . ESOPHAGOGASTRODUODENOSCOPY (EGD) WITH PROPOFOL N/A 06/05/2019   Procedure: ESOPHAGOGASTRODUODENOSCOPY (EGD) WITH PROPOFOL;  Surgeon: Rogene Houston, MD;  [facility];  Service: Endoscopy;  Laterality: N/A;  1220pm  . HARDWARE REMOVAL Right 10/09/2014   Procedure: Removal Deep Hardware, Irrigation and Debridement Calcaneus, Place Antibiotic Beads and Wound VAC ;  Surgeon: Newt Minion, MD;  Location: [place];  Service: Orthopedics;  Laterality: Right;  . HARDWARE REMOVAL Right 08/12/2015   Procedure: Removal Deep Hardware Right Foot;  Surgeon: Newt Minion,  MD;  Location: [place];  Service: Orthopedics;  Laterality: Right;  . HARDWARE REMOVAL Right 11/26/2018   Procedure: REMOVAL RIGHT TIBIOCALCANEAL NAIL;  Surgeon: Newt Minion, MD;  Location: [place];  Service: Orthopedics;  Laterality: Right;  . HARDWARE REMOVAL Right 12/23/2018   Procedure: RIGHT ANKLE REMOVE HARDWARE, PLACE ANTIBIOTIC BEADS and placement of wound vac;  Surgeon: Newt Minion, MD;  Location: [place];  Service: Orthopedics;  Laterality: Right;  . HARDWARE REMOVAL Right 09/29/2019   Procedure: RIGHT ANKLE REMOVE HARDWARE, DEBRIDEMENT;  Surgeon: Newt Minion, MD;  Location: [place];  Service: Orthopedics;  Laterality: Right;  . I & D EXTREMITY Right 09/15/2014   Procedure: IRRIGATION AND DEBRIDEMENT Ankle;  Surgeon: Renette Butters, MD;  Location: [place];  Service: Orthopedics;  Laterality: Right;  . INGUINAL HERNIA REPAIR Bilateral   . LAMINECTOMY WITH POSTERIOR LATERAL ARTHRODESIS LEVEL 4 N/A 09/10/2018   Procedure: Posterior Lateral Fusion - Thoracic Eleven-Thoracic Twelve - Thoracic Twelve-Lumbar One - Lumbar One-Lumbar Two - Lumbar Two-Lumbar Three with instrumentaion and PLA;  Surgeon: Kary Kos, MD;  Location: [place];  Service: Neurosurgery;  Laterality: N/A;  Posterior Lateral Fusion - Thoracic Eleven-Thoracic Twelve - Thoracic Twelve-Lumbar One - Lumbar One-Lumbar Two - Lumbar Two-Lumbar Thre  . LIVER BIOPSY N/A 07/06/2019   Procedure: LIVER BIOPSY;  Surgeon: Virl Cagey, MD;  [facility];  Service: General;  Laterality: N/A;  . LUMBAR PERCUTANEOUS PEDICLE SCREW 1 LEVEL N/A 04/05/2017   Procedure: LUMBAR PERCUTANEOUS PEDICLE SCREW LUMBAR THREE-FOUR;  Surgeon: Kary Kos, MD;  Location: [place];  Service: Neurosurgery;  Laterality: N/A;  . ORIF CALCANEOUS FRACTURE Right 09/19/2014   Procedure: OPEN REDUCTION INTERNAL FIXATION (ORIF) CALCANEOUS FRACTURE;  Surgeon: Newt Minion, MD;  Location: [place];  Service: Orthopedics;  Laterality: Right;  . ORIF CALCANEOUS FRACTURE Left  09/19/2014   Procedure: OPEN REDUCTION INTERNAL FIXATION (ORIF) CALCANEOUS FRACTURE;  Surgeon: Newt Minion, MD;  Location: [place];  Service: Orthopedics;  Laterality: Left;  . POLYPECTOMY  06/05/2019   Procedure: POLYPECTOMY;  Surgeon: Rogene Houston, MD;  [facility];  Service: Endoscopy;;  cecal polyp biopsy forcep, ascending polyp cold snare    There were no vitals filed for this visit.   Subjective Assessment - 05/17/20 1011    Subjective He is using tylenol pm to sleep as residual limb pain.    Pertinent History Right TTA, Posterior Lateral Fusion T11-L3 09/2018, OA LBP & left knee, anxiety, depression    Limitations Standing;Walking;House hold activities    How long can you walk comfortably? 8/10 first walking then settles then 6/10. 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 Yes    Pain Score 6    residual limb   Pain Location Leg   residual limb  Pain Descriptors / Indicators Tingling;Shooting;Sharp    Pain Type Chronic pain    Pain Onset More than a month ago    Pain Frequency Constant    Aggravating Factors  worse when takes prosthesis off especially at night    Pain Relieving Factors medication    Pain Score 8    Pain Location Back    Pain Orientation Right;Lower    Pain Descriptors / Indicators Aching;Sore    Pain Type Chronic pain    Pain Onset More than a month ago    Pain Frequency Constant    Aggravating Factors  standing & walking    Pain Relieving Factors medications and stretches.    Pain Onset More than a month ago                             [facility] Adult PT Treatment/Exercise - 05/17/20 1010      Transfers   Transfers Sit to Stand;Stand to Sit;Floor to Transfer    Sit to Stand 5: Supervision;With upper extremity assist;With armrests;From chair/3-in-1    Stand to Sit 5: Supervision;With upper extremity assist;With  armrests;To chair/3-in-1      Ambulation/Gait   Ambulation/Gait Yes    Ambulation/Gait Assistance 5: Supervision    Ambulation/Gait Assistance Details verbal cues to stand up straight prior to initiating gait.     Ambulation Distance (Feet) 200 Feet    Assistive device Prosthesis;None    Gait Pattern Step-through pattern;Decreased stance time - right;Decreased step length - left;Decreased hip/knee flexion - right;Decreased weight shift to right;Right hip hike;Right foot flat;Right flexed knee in stance;Left flexed knee in stance;Antalgic;Lateral hip instability;Trunk flexed;Abducted- right    Ambulation Surface Level;Indoor      Self-Care   Self-Care Other Self-Care Comments    Other Self-Care Comments  Use of TheraCane for back spasms      Lumbar Exercises: Stretches   Single Knee to Chest Stretch Right;Left;2 reps;30 seconds    Single Knee to Chest Stretch Limitations option of standing with one leg on 10" stool with hip flexion    Double Knee to Chest Stretch --    Double Knee to Chest Stretch Limitations --    Lower Trunk Rotation 30 seconds;2 reps    Lower Trunk Rotation Limitations demo & verbal cues on option standing with back to support surface.     Other Lumbar Stretch Exercise --      Lumbar Exercises: Seated   Other Seated Lumbar Exercises --      Lumbar Exercises: Supine   Bridge Limitations --      Knee/Hip Exercises: Stretches   Other Knee/Hip Stretches --      Knee/Hip Exercises: Aerobic   Nustep Level 6 with BUEs & BLEs 6 minutes with cues LEs 75% & UEs 25%.  Maintain LEs in line with hip & no rotation /abduction.       Knee/Hip Exercises: Machines for Strengthening   Cybex Leg Press Rt leg only 50 lbs 3X10 reps interspaced with 30 sec H.S. stretch on Rt. Then bilat push at 100 lbs 3X10 interspaced with Lt H.S. stretch       Prosthetics   Prosthetic Care Comments  Neuromas and Transtibial Amputations. 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      Current  prosthetic wear tolerance (days/week)  daily    Current prosthetic wear tolerance (#hours/day)  6hours 2x/day    Edema pitting edema    Residual limb condition  Pain distal lateral limb increases with palpation or Tinel's.  no open areas, only mild redness over tibial tubercle but none on crest, good hair growth    Education Provided Skin check;Residual limb care;Correct ply sock adjustment;Proper wear schedule/adjustment;Proper weight-bearing schedule/adjustment;Other (comment)    Person(s) Educated Patient    Education Method Explanation;Verbal cues    Education Method Verbalized understanding;Verbal cues required;Needs further instruction    Donning Prosthesis Supervision    Doffing Prosthesis Modified independent (device/increased time)                    PT Short Term Goals - 05/17/20 1516      PT SHORT TERM GOAL #1   Title Patient demonstrates proper donning & verbalizes general understanding of adjusting ply socks. (All STGs Target Date: 05/13/2020)    Baseline MET 05/17/2020    Time 4    Period Weeks    Status Achieved    Target Date 05/13/20      PT SHORT TERM GOAL #2   Title Patient tolerates wear of prosthetic liner >8hrs /day and prosthesis >4 of those hours.    Baseline MET 05/17/2020    Time 4    Period Weeks    Status Achieved    Target Date 05/13/20      PT SHORT TERM GOAL #3   Title Patient reports residual limb pain </=7/10 with wear & standing.    Baseline MET 05/17/2020    Time 4    Period Weeks    Status Achieved    Target Date 05/13/20      PT SHORT TERM GOAL #4   Title Patient able to pick up items from floor without UE support.    Baseline MET 05/17/2020    Time 4    Period Weeks    Status Achieved    Target Date 05/13/20      PT SHORT TERM GOAL #5   Title Patient ambulates 100' with cane & prosthesis with supervision.    Baseline MET 05/17/2020    Time 4    Period Weeks    Status Achieved    Target Date 05/13/20             PT  Short Term Goals - 05/17/20 1527      PT SHORT TERM GOAL #1   Title Patient verbalizes proper limb care including care of limb pain.   (All STGs Target Date: 06/10/2020)    Time 4    Period Weeks    Status New    Target Date 06/10/20      PT SHORT TERM GOAL #2   Title Patient tolerates wear of prosthetic liner >12 hrs /day and prosthesis >6 of those hours.    Time 4    Period Weeks    Status Revised    Target Date 06/10/20      PT SHORT TERM GOAL #3   Title Patient reports residual limb pain </=6/10    Time 4    Period Weeks    Status Revised    Target Date 06/10/20      PT SHORT TERM GOAL #4   Title Patient able to lift 15# box with supervision.    Time 4    Period Weeks    Status Revised    Target Date 06/10/20      PT SHORT TERM GOAL #5   Title Patient ambulates 400' with prosthesis only with supervision.    Time 4  Period Weeks    Status Revised    Target Date 06/10/20             PT Long Term Goals - 04/19/20 1604      PT LONG TERM GOAL #1   Title Patient verbalizes & demonstrates understanding of proper prosthetic care to enable safe use of prosthesis. (All LTGs Target Date: 07/08/2020)    Time 12    Period Weeks    Status On-going    Target Date 07/08/20      PT LONG TERM GOAL #2   Title Patient tolerates prosthesis wear >90% of awake hours without skin issues & residual limb pain </=4/10.    Time 12    Period Weeks    Status On-going    Target Date 07/08/20      PT LONG TERM GOAL #3   Title Berg Balance >45/56 indicating lower fall risk.    Time 12    Period Weeks    Status On-going    Target Date 07/08/20      PT LONG TERM GOAL #4   Title Patient ambulates >1000' outdoors including grass with cane or less & prosthesis modified independent.    Time 12    Period Weeks    Status On-going    Target Date 07/08/20      PT LONG TERM GOAL #5   Title Patient negotiates ramps, curbs & stairs with cane or less & prosthesis modified independent.      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    Time 12    Period Weeks    Status On-going    Target Date 07/08/20      PT LONG TERM GOAL #7   Title Patient reports above tasks increases his chronic back & knee pain <2 increments on 0-10 scale.    Time 12    Period Weeks    Status On-going    Target Date 07/08/20                 Plan - 05/17/20 1010    Clinical Impression Statement Patient met STGs set for initial 30-day period.  Patient appears to have signs of Peroneal Nerve neuroma. PT educated patient on standing options for stretches and he appears to understand.    Personal Factors and Comorbidities Comorbidity 3+;Fitness;Past/Current Experience;Time since onset of injury/illness/exacerbation    Comorbidities Right TTA, Posterior Lateral Fusion T11-L3 09/2018, OA LBP & left knee, anxiety, depression    Examination-Activity Limitations Lift;Locomotion Level;Squat;Stairs;Stand;Transfers    Examination-Participation Restrictions Community Activity;Yard Work;Other   farm tasks   Stability/Clinical Decision Making Evolving/Moderate complexity    Rehab Potential Good    PT Frequency 2x / week    PT Duration 12 weeks    PT Treatment/Interventions ADLs/Self Care Home Management;DME Instruction;Moist Heat;Electrical Stimulation;Gait training;Stair training;Functional mobility training;Therapeutic activities;Therapeutic exercise;Balance training;Neuromuscular re-education;Patient/family education;Prosthetic Training;Manual techniques;Scar mobilization;Passive range of motion;Dry needling;Vestibular    PT Next Visit Plan Do 10th visit note,  work towards updated STGs, therapuetic exercise, prosthetic gait training & balance activities    Consulted and Agree with Plan of Care Patient            Patient will benefit from skilled therapeutic intervention in order to improve the following  deficits and impairments:  Abnormal gait, Decreased activity tolerance, Decreased balance, Decreased endurance, Decreased knowledge of use of DME, Decreased mobility, Decreased range of motion, Decreased skin integrity, Decreased scar mobility, Decreased strength, Impaired flexibility, Postural dysfunction, Prosthetic Dependency, Pain  Visit Diagnosis: Other abnormalities of gait and mobility  Unsteadiness on feet  Pain in right lower leg  Chronic pain of left knee  Chronic midline low back pain without sciatica  Stiffness of right knee, not elsewhere classified  Muscle weakness (generalized)  Stiffness of left knee, not elsewhere classified     Problem List Patient Active Problem List   Diagnosis Date Noted  . Acute hematogenous osteomyelitis of right foot ([place]) 01/29/2020  . Post-traumatic arthritis of ankle, right   . Cutaneous abscess of right ankle   . Subacute osteomyelitis of right tibia ([place])   . Fatty liver 06/30/2019  . Calculus of gallbladder without cholecystitis without obstruction 06/30/2019  . Nausea without vomiting 05/13/2019  . Abdominal bloating 05/13/2019  . Wound infection following procedure 12/19/2018  . Malunion of joint fusion ([place]) 11/26/2018  . 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[facility])   . Status post lumbar spinal fusion 09/10/2018  . S/P ankle fusion 11/27/2017  . Nonunion of subtalar arthrodesis   . Avascular necrosis of talus, right ([place])   . Post-traumatic osteoarthritis, left ankle and foot 10/22/2017  . DDD (degenerative disc disease), lumbar 04/05/2017  . Fracture of L2 vertebra ([place]) 01/12/2015  . Acute osteomyelitis of calcaneum ([place]) 10/09/2014  . Dysuria 10/05/2014  . Cellulitis 10/05/2014  . Fall from ladder 09/21/2014  . L2 vertebral fracture ([place]) 09/21/2014  . Bilateral calcaneal fractures 09/21/2014  . Chronic pain 09/21/2014  . Acute blood  loss anemia 09/21/2014  . Open fracture of both calcanei 09/15/2014    Jamey Reas PT, DPT 05/17/2020, 3:27 PM  [facility] [address] [place], [place], [address] Phone: [phone]   Fax:  [phone]  Name: SHANDELL JALLOW MRN: [id number] Date of Birth: 1956-05-17

## 2020-05-18 NOTE — Telephone Encounter (Signed)
Will update rx

## 2020-05-18 NOTE — Telephone Encounter (Signed)
Thank you :)

## 2020-05-18 NOTE — Telephone Encounter (Signed)
Appt tomorrow at 1:00

## 2020-05-18 NOTE — Telephone Encounter (Signed)
Pt wants to know if he can increase his Neurontin please see below.

## 2020-05-19 ENCOUNTER — Ambulatory Visit (INDEPENDENT_AMBULATORY_CARE_PROVIDER_SITE_OTHER): Payer: PPO | Admitting: Physical Therapy

## 2020-05-19 ENCOUNTER — Encounter: Payer: Self-pay | Admitting: Orthopedic Surgery

## 2020-05-19 ENCOUNTER — Ambulatory Visit: Payer: PPO | Admitting: Orthopedic Surgery

## 2020-05-19 ENCOUNTER — Other Ambulatory Visit: Payer: Self-pay

## 2020-05-19 VITALS — Ht 72.0 in | Wt 143.0 lb

## 2020-05-19 DIAGNOSIS — R2689 Other abnormalities of gait and mobility: Secondary | ICD-10-CM

## 2020-05-19 DIAGNOSIS — R2681 Unsteadiness on feet: Secondary | ICD-10-CM

## 2020-05-19 DIAGNOSIS — M25661 Stiffness of right knee, not elsewhere classified: Secondary | ICD-10-CM | POA: Diagnosis not present

## 2020-05-19 DIAGNOSIS — M25662 Stiffness of left knee, not elsewhere classified: Secondary | ICD-10-CM

## 2020-05-19 DIAGNOSIS — Z9181 History of falling: Secondary | ICD-10-CM | POA: Diagnosis not present

## 2020-05-19 DIAGNOSIS — M19172 Post-traumatic osteoarthritis, left ankle and foot: Secondary | ICD-10-CM | POA: Diagnosis not present

## 2020-05-19 DIAGNOSIS — M545 Low back pain, unspecified: Secondary | ICD-10-CM

## 2020-05-19 DIAGNOSIS — G8929 Other chronic pain: Secondary | ICD-10-CM | POA: Diagnosis not present

## 2020-05-19 DIAGNOSIS — M25562 Pain in left knee: Secondary | ICD-10-CM

## 2020-05-19 DIAGNOSIS — M6281 Muscle weakness (generalized): Secondary | ICD-10-CM | POA: Diagnosis not present

## 2020-05-19 DIAGNOSIS — M79661 Pain in right lower leg: Secondary | ICD-10-CM | POA: Diagnosis not present

## 2020-05-19 DIAGNOSIS — Z89511 Acquired absence of right leg below knee: Secondary | ICD-10-CM

## 2020-05-19 NOTE — Therapy (Signed)
[facility] Physical Therapy [address] [place], [place], [address] Phone: [phone]   Fax:  [phone]  Physical Therapy Treatment Progress Note reporting period date 04/14/20 to 05/19/20  See below for objective and subjective measurements relating to patients progress with PT.   Patient Details  Name: Bradley Espinoza MRN: [id number] Date of Birth: Mar 14, 1956 Referring Provider (PT): Meridee Score, MD   Encounter Date: 05/19/2020   PT End of Session - 05/19/20 1135    Visit Number 10    Number of Visits 25    Date for PT Re-Evaluation 07/13/20    Authorization Type [employer]    Authorization Time Period $15 co-pay    Progress Note Due on Visit 10    PT Start Time 1015    PT Stop Time 1100    PT Time Calculation (min) 45 min    Activity Tolerance Patient tolerated treatment well;Patient limited by pain    Behavior During Therapy [facility] for tasks assessed/performed           Past Medical History:  Diagnosis Date  . Anxiety   . Arthritis    low back pain, lumbar radiculopathy  . Depression   . Fatty liver   . Fracture    B/L ankles  . GERD (gastroesophageal reflux disease)   . Headache(784.0)    allergy related   . History of kidney stones    passed stones  . History of stomach ulcers   . Retained orthopedic hardware    failed retained hardware right foot  . Wears glasses   . Wears glasses   . Wears partial dentures    upper    Past Surgical History:  Procedure Laterality Date  . AMPUTATION Right 01/29/2020   Procedure: RIGHT BELOW KNEE AMPUTATION;  Surgeon: Newt Minion, MD;  Location: [place];  Service: Orthopedics;  Laterality: Right;  . ANKLE FUSION Right 05/11/2015   Procedure: Right Posterior Arthroscopic Subtalar Arthrodesis;  Surgeon: Newt Minion, MD;  Location: [place];  Service: Orthopedics;  Laterality: Right;  . ANKLE FUSION Right 11/27/2017   Procedure: RIGHT TIBIOCALCANEAL FUSION;  Surgeon: Newt Minion,  MD;  Location: [place];  Service: Orthopedics;  Laterality: Right;  . ANKLE FUSION Right 11/26/2018   Procedure: RIGHT ANTERIOR ANKLE FUSION, APPLY VAC;  Surgeon: Newt Minion, MD;  Location: [place];  Service: Orthopedics;  Laterality: Right;  . ANKLE FUSION Right 09/09/2019   Procedure: REVISION RIGHT ANKLE FUSION;  Surgeon: Newt Minion, MD;  Location: [place];  Service: Orthopedics;  Laterality: Right;  . ANTERIOR LAT LUMBAR FUSION N/A 04/05/2017   Procedure: Extreme Lateral Interbody Fusion - Lumbar three-lumbar four ,exploration of fusion Posterior augmentation with globus addition Removal hardware Lumbar one-three. Lumbar four-sacral one,  Removal internal bone growth stimulator;  Surgeon: Kary Kos, MD;  Location: [place];  Service: Neurosurgery;  Laterality: N/A;  . APPLICATION OF WOUND VAC  12/23/2018   Procedure: Application Of Wound Vac;  Surgeon: Newt Minion, MD;  Location: [place];  Service: Orthopedics;;  . BACK SURGERY  2004   x 2  . BIOPSY  06/05/2019   Procedure: BIOPSY;  Surgeon: Rogene [place], MD;  [facility];  Service: Endoscopy;;  gastric biopsy  . CERVICAL SPINE SURGERY  2008  . CHOLECYSTECTOMY N/A 07/06/2019   Procedure: LAPAROSCOPIC CHOLECYSTECTOMY;  Surgeon: Virl Cagey, MD;  [facility];  Service: General;  Laterality: N/A;  . COLONOSCOPY WITH PROPOFOL N/A 06/05/2019  Procedure: COLONOSCOPY WITH PROPOFOL;  Surgeon: Rogene [place], MD;  [facility];  Service: Endoscopy;  Laterality: N/A;  . ESOPHAGOGASTRODUODENOSCOPY    . ESOPHAGOGASTRODUODENOSCOPY (EGD) WITH PROPOFOL N/A 06/05/2019   Procedure: ESOPHAGOGASTRODUODENOSCOPY (EGD) WITH PROPOFOL;  Surgeon: Rogene [place], MD;  [facility];  Service: Endoscopy;  Laterality: N/A;  1220pm  . HARDWARE REMOVAL Right 10/09/2014   Procedure: Removal Deep Hardware, Irrigation and Debridement Calcaneus, Place Antibiotic Beads and Wound VAC ;  Surgeon: Newt Minion, MD;  Location: [place];   Service: Orthopedics;  Laterality: Right;  . HARDWARE REMOVAL Right 08/12/2015   Procedure: Removal Deep Hardware Right Foot;  Surgeon: Newt Minion, MD;  Location: [place];  Service: Orthopedics;  Laterality: Right;  . HARDWARE REMOVAL Right 11/26/2018   Procedure: REMOVAL RIGHT TIBIOCALCANEAL NAIL;  Surgeon: Newt Minion, MD;  Location: [place];  Service: Orthopedics;  Laterality: Right;  . HARDWARE REMOVAL Right 12/23/2018   Procedure: RIGHT ANKLE REMOVE HARDWARE, PLACE ANTIBIOTIC BEADS and placement of wound vac;  Surgeon: Newt Minion, MD;  Location: [place];  Service: Orthopedics;  Laterality: Right;  . HARDWARE REMOVAL Right 09/29/2019   Procedure: RIGHT ANKLE REMOVE HARDWARE, DEBRIDEMENT;  Surgeon: Newt Minion, MD;  Location: [place];  Service: Orthopedics;  Laterality: Right;  . I & D EXTREMITY Right 09/15/2014   Procedure: IRRIGATION AND DEBRIDEMENT Ankle;  Surgeon: Renette Butters, MD;  Location: [place];  Service: Orthopedics;  Laterality: Right;  . INGUINAL HERNIA REPAIR Bilateral   . LAMINECTOMY WITH POSTERIOR LATERAL ARTHRODESIS LEVEL 4 N/A 09/10/2018   Procedure: Posterior Lateral Fusion - Thoracic Eleven-Thoracic Twelve - Thoracic Twelve-Lumbar One - Lumbar One-Lumbar Two - Lumbar Two-Lumbar Three with instrumentaion and PLA;  Surgeon: Kary Kos, MD;  Location: [place];  Service: Neurosurgery;  Laterality: N/A;  Posterior Lateral Fusion - Thoracic Eleven-Thoracic Twelve - Thoracic Twelve-Lumbar One - Lumbar One-Lumbar Two - Lumbar Two-Lumbar Thre  . LIVER BIOPSY N/A 07/06/2019   Procedure: LIVER BIOPSY;  Surgeon: Virl Cagey, MD;  [facility]: [facility];  Service: General;  Laterality: N/A;  . LUMBAR PERCUTANEOUS PEDICLE SCREW 1 LEVEL N/A 04/05/2017   Procedure: LUMBAR PERCUTANEOUS PEDICLE SCREW LUMBAR THREE-FOUR;  Surgeon: Kary Kos, MD;  Location: [place];  Service: Neurosurgery;  Laterality: N/A;  . ORIF CALCANEOUS FRACTURE Right 09/19/2014   Procedure: OPEN REDUCTION INTERNAL FIXATION  (ORIF) CALCANEOUS FRACTURE;  Surgeon: Newt Minion, MD;  Location: [place];  Service: Orthopedics;  Laterality: Right;  . ORIF CALCANEOUS FRACTURE Left 09/19/2014   Procedure: OPEN REDUCTION INTERNAL FIXATION (ORIF) CALCANEOUS FRACTURE;  Surgeon: Newt Minion, MD;  Location: [place];  Service: Orthopedics;  Laterality: Left;  . POLYPECTOMY  06/05/2019   Procedure: POLYPECTOMY;  Surgeon: Rogene [place], MD;  [facility];  Service: Endoscopy;;  cecal polyp biopsy forcep, ascending polyp cold snare    There were no vitals filed for this visit.   Subjective Assessment - 05/19/20 1122    Subjective relays his back pain doesnt seem to be getting better. He brought in home TENS unit and would like to know how to use it. he will get back injections soon    Pertinent History Right TTA, Posterior Lateral Fusion T11-L3 09/2018, OA LBP & left knee, anxiety, depression    Limitations Standing;Walking;House hold activities    Currently in Pain? Yes    Pain Score 6     Pain Location Back  Young Adult PT Treatment/Exercise - 05/19/20 0001      Transfers   Transfers Sit to Stand;Stand to Sit;Floor to Transfer    Sit to Stand 5: Supervision;With upper extremity assist;With armrests;From chair/3-in-1    Stand to Sit 5: Supervision;With upper extremity assist;With armrests;To chair/3-in-1      Ambulation/Gait   Ambulation/Gait Yes    Ambulation/Gait Assistance 5: Supervision    Ambulation Distance (Feet) 200 Feet    Assistive device Prosthesis;None    Gait Pattern Step-through pattern;Decreased stance time - right;Decreased step length - left;Decreased hip/knee flexion - right;Decreased weight shift to right;Right hip hike;Right foot flat;Right flexed knee in stance;Left flexed knee in stance;Antalgic;Lateral hip instability;Trunk flexed;Abducted- right      Self-Care   Other Self-Care Comments  set up and use of home TENS for Lumbar pain  managment      Lumbar Exercises: Stretches   Other Lumbar Stretch Exercise seated flexion stretch rolling out pball 10 sec X 10 reps      Lumbar Exercises: Aerobic   Nustep L6 X 10 min UE/LE      Modalities   Modalities Electrical Stimulation;Moist Heat      Moist Heat Therapy   Number Minutes Moist Heat 15 Minutes    Moist Heat Location Lumbar Spine      Electrical Stimulation   Electrical Stimulation Location lumbar    Electrical Stimulation Action TENS only 1 pres setting on his home unit    Electrical Stimulation Parameters tolerance    Electrical Stimulation Goals Pain                    PT Short Term Goals - 05/17/20 1527      PT SHORT TERM GOAL #1   Title Patient verbalizes proper limb care including care of limb pain.   (All STGs Target Date: 06/10/2020)    Time 4    Period Weeks    Status New    Target Date 06/10/20      PT SHORT TERM GOAL #2   Title Patient tolerates wear of prosthetic liner >12 hrs /day and prosthesis >6 of those hours.    Time 4    Period Weeks    Status Revised    Target Date 06/10/20      PT SHORT TERM GOAL #3   Title Patient reports residual limb pain </=6/10    Time 4    Period Weeks    Status Revised    Target Date 06/10/20      PT SHORT TERM GOAL #4   Title Patient able to lift 15# box with supervision.    Time 4    Period Weeks    Status Revised    Target Date 06/10/20      PT SHORT TERM GOAL #5   Title Patient ambulates 400' with prosthesis only with supervision.    Time 4    Period Weeks    Status Revised    Target Date 06/10/20             PT Long Term Goals - 04/19/20 1604      PT LONG TERM GOAL #1   Title Patient verbalizes & demonstrates understanding of proper prosthetic care to enable safe use of prosthesis. (All LTGs Target Date: 07/08/2020)    Time 12    Period Weeks    Status On-going    Target Date 07/08/20      PT LONG TERM GOAL #2   Title Patient tolerates  prosthesis wear >90% of awake  hours without skin issues & residual limb pain </=4/10.    Time 12    Period Weeks    Status On-going    Target Date 07/08/20      PT LONG TERM GOAL #3   Title Berg Balance >45/56 indicating lower fall risk.    Time 12    Period Weeks    Status On-going    Target Date 07/08/20      PT LONG TERM GOAL #4   Title Patient ambulates >1000' outdoors including grass with cane or less & prosthesis modified independent.    Time 12    Period Weeks    Status On-going    Target Date 07/08/20      PT LONG TERM GOAL #5   Title Patient negotiates ramps, curbs & stairs with cane or less & prosthesis modified independent.    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    Time 12    Period Weeks    Status On-going    Target Date 07/08/20      PT LONG TERM GOAL #7   Title Patient reports above tasks increases his chronic back & knee pain <2 increments on 0-10 scale.    Time 12    Period Weeks    Status On-going    Target Date 07/08/20                 Plan - 05/19/20 1136    Clinical Impression Statement Overall has made progress with PT and has met STG's. However progress has been slowed by chronic LBP limiting his activity tolerance. Session focused on setting up his home TENS unit and showing him how to effectively use in efforts to manage his chronic LBP better. He will continue to benefit from skilled PT to address his functional deficits.    Personal Factors and Comorbidities Comorbidity 3+;Fitness;Past/Current Experience;Time since onset of injury/illness/exacerbation    Comorbidities Right TTA, Posterior Lateral Fusion T11-L3 09/2018, OA LBP & left knee, anxiety, depression    Examination-Activity Limitations Lift;Locomotion Level;Squat;Stairs;Stand;Transfers    Examination-Participation  Restrictions Community Activity;Yard Work;Other   farm tasks   Stability/Clinical Decision Making Evolving/Moderate complexity    Rehab Potential Good    PT Frequency 2x / week    PT Duration 12 weeks    PT Treatment/Interventions ADLs/Self Care Home Management;DME Instruction;Moist Heat;Electrical Stimulation;Gait training;Stair training;Functional mobility training;Therapeutic activities;Therapeutic exercise;Balance training;Neuromuscular re-education;Patient/family education;Prosthetic Training;Manual techniques;Scar mobilization;Passive range of motion;Dry needling;Vestibular    PT Next Visit Plan work towards updated STGs, therapuetic exercise, prosthetic gait training & balance activities    Consulted and Agree with Plan of Care Patient           Patient will benefit from skilled therapeutic intervention in order to improve the following deficits and impairments:  Abnormal gait, Decreased activity tolerance, Decreased balance, Decreased endurance, Decreased knowledge of use of DME, Decreased mobility, Decreased range of motion, Decreased skin integrity, Decreased scar mobility, Decreased strength, Impaired flexibility, Postural dysfunction, Prosthetic Dependency, Pain  Visit Diagnosis: Other abnormalities of gait and mobility  Unsteadiness on feet  Pain in right lower leg  Chronic pain of left knee  Chronic midline low back pain without sciatica  Stiffness of right knee, not  elsewhere classified  Muscle weakness (generalized)  Stiffness of left knee, not elsewhere classified  History of falling     Problem List Patient Active Problem List   Diagnosis Date Noted  . Acute hematogenous osteomyelitis of right foot ([place]) 01/29/2020  . Post-traumatic arthritis of ankle, right   . Cutaneous abscess of right ankle   . Subacute osteomyelitis of right tibia ([place])   . Fatty liver 06/30/2019  . Calculus of gallbladder without cholecystitis without obstruction 06/30/2019  .  Nausea without vomiting 05/13/2019  . Abdominal bloating 05/13/2019  . Wound infection following procedure 12/19/2018  . Malunion of joint fusion ([place]) 11/26/2018  . 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[facility])   . Status post lumbar spinal fusion 09/10/2018  . S/P ankle fusion 11/27/2017  . Nonunion of subtalar arthrodesis   . Avascular necrosis of talus, right ([place])   . Post-traumatic osteoarthritis, left ankle and foot 10/22/2017  . DDD (degenerative disc disease), lumbar 04/05/2017  . Fracture of L2 vertebra ([place]) 01/12/2015  . Acute osteomyelitis of calcaneum ([place]) 10/09/2014  . Dysuria 10/05/2014  . Cellulitis 10/05/2014  . Fall from ladder 09/21/2014  . L2 vertebral fracture ([place]) 09/21/2014  . Bilateral calcaneal fractures 09/21/2014  . Chronic pain 09/21/2014  . Acute blood loss anemia 09/21/2014  . Open fracture of both calcanei 09/15/2014    Silvestre Mesi 05/19/2020, 11:42 AM  [facility] Physical Therapy [address] [place], [place], [address] Phone: [phone]   Fax:  [phone]  Name: JAHMAI FINELLI MRN: [id number] Date of Birth: January 21, 1956

## 2020-05-24 ENCOUNTER — Other Ambulatory Visit: Payer: Self-pay

## 2020-05-24 ENCOUNTER — Encounter: Payer: Self-pay | Admitting: Physical Therapy

## 2020-05-24 ENCOUNTER — Ambulatory Visit (INDEPENDENT_AMBULATORY_CARE_PROVIDER_SITE_OTHER): Payer: PPO | Admitting: Physical Therapy

## 2020-05-24 DIAGNOSIS — M25661 Stiffness of right knee, not elsewhere classified: Secondary | ICD-10-CM | POA: Diagnosis not present

## 2020-05-24 DIAGNOSIS — M545 Low back pain, unspecified: Secondary | ICD-10-CM

## 2020-05-24 DIAGNOSIS — M79661 Pain in right lower leg: Secondary | ICD-10-CM

## 2020-05-24 DIAGNOSIS — M25562 Pain in left knee: Secondary | ICD-10-CM

## 2020-05-24 DIAGNOSIS — R2681 Unsteadiness on feet: Secondary | ICD-10-CM

## 2020-05-24 DIAGNOSIS — M25662 Stiffness of left knee, not elsewhere classified: Secondary | ICD-10-CM

## 2020-05-24 DIAGNOSIS — M6281 Muscle weakness (generalized): Secondary | ICD-10-CM | POA: Diagnosis not present

## 2020-05-24 DIAGNOSIS — Z9181 History of falling: Secondary | ICD-10-CM

## 2020-05-24 DIAGNOSIS — R2689 Other abnormalities of gait and mobility: Secondary | ICD-10-CM | POA: Diagnosis not present

## 2020-05-24 DIAGNOSIS — G8929 Other chronic pain: Secondary | ICD-10-CM | POA: Diagnosis not present

## 2020-05-24 NOTE — Therapy (Signed)
[facility] Physical Therapy [address] [place], [place], [address] Phone: [phone]   Fax:  [phone]  Physical Therapy Treatment  Patient Details  Name: Bradley Espinoza MRN: [id number] Date of Birth: 1956-04-02 Referring Provider (PT): Meridee Score, MD   Encounter Date: 05/24/2020   PT End of Session - 05/24/20 1123    Visit Number 11    Number of Visits 25    Date for PT Re-Evaluation 07/13/20    Authorization Type [employer]    Authorization Time Period $15 co-pay    Progress Note Due on Visit 10    PT Start Time 1017    PT Stop Time 1105    PT Time Calculation (min) 48 min    Activity Tolerance Patient tolerated treatment well;Patient limited by pain    Behavior During Therapy [facility] for tasks assessed/performed           Past Medical History:  Diagnosis Date  . Anxiety   . Arthritis    low back pain, lumbar radiculopathy  . Depression   . Fatty liver   . Fracture    B/L ankles  . GERD (gastroesophageal reflux disease)   . Headache(784.0)    allergy related   . History of kidney stones    passed stones  . History of stomach ulcers   . Retained orthopedic hardware    failed retained hardware right foot  . Wears glasses   . Wears glasses   . Wears partial dentures    upper    Past Surgical History:  Procedure Laterality Date  . AMPUTATION Right 01/29/2020   Procedure: RIGHT BELOW KNEE AMPUTATION;  Surgeon: Newt Minion, MD;  Location: [place];  Service: Orthopedics;  Laterality: Right;  . ANKLE FUSION Right 05/11/2015   Procedure: Right Posterior Arthroscopic Subtalar Arthrodesis;  Surgeon: Newt Minion, MD;  Location: [place];  Service: Orthopedics;  Laterality: Right;  . ANKLE FUSION Right 11/27/2017   Procedure: RIGHT TIBIOCALCANEAL FUSION;  Surgeon: Newt Minion, MD;  Location: [place];  Service: Orthopedics;  Laterality: Right;  . ANKLE FUSION Right 11/26/2018   Procedure: RIGHT ANTERIOR ANKLE FUSION, APPLY VAC;   Surgeon: Newt Minion, MD;  Location: [place];  Service: Orthopedics;  Laterality: Right;  . ANKLE FUSION Right 09/09/2019   Procedure: REVISION RIGHT ANKLE FUSION;  Surgeon: Newt Minion, MD;  Location: [place];  Service: Orthopedics;  Laterality: Right;  . ANTERIOR LAT LUMBAR FUSION N/A 04/05/2017   Procedure: Extreme Lateral Interbody Fusion - Lumbar three-lumbar four ,exploration of fusion Posterior augmentation with globus addition Removal hardware Lumbar one-three. Lumbar four-sacral one,  Removal internal bone growth stimulator;  Surgeon: Kary Kos, MD;  Location: [place];  Service: Neurosurgery;  Laterality: N/A;  . APPLICATION OF WOUND VAC  12/23/2018   Procedure: Application Of Wound Vac;  Surgeon: Newt Minion, MD;  Location: [place];  Service: Orthopedics;;  . BACK SURGERY  2004   x 2  . BIOPSY  06/05/2019   Procedure: BIOPSY;  Surgeon: Rogene Houston, MD;  [facility];  Service: Endoscopy;;  gastric biopsy  . CERVICAL SPINE SURGERY  2008  . CHOLECYSTECTOMY N/A 07/06/2019   Procedure: LAPAROSCOPIC CHOLECYSTECTOMY;  Surgeon: Virl Cagey, MD;  [facility];  Service: General;  Laterality: N/A;  . COLONOSCOPY WITH PROPOFOL N/A 06/05/2019   Procedure: COLONOSCOPY WITH PROPOFOL;  Surgeon: Rogene Houston, MD;  [facility];  Service: Endoscopy;  Laterality: N/A;  .  ESOPHAGOGASTRODUODENOSCOPY    . ESOPHAGOGASTRODUODENOSCOPY (EGD) WITH PROPOFOL N/A 06/05/2019   Procedure: ESOPHAGOGASTRODUODENOSCOPY (EGD) WITH PROPOFOL;  Surgeon: Rogene Houston, MD;  [facility];  Service: Endoscopy;  Laterality: N/A;  1220pm  . HARDWARE REMOVAL Right 10/09/2014   Procedure: Removal Deep Hardware, Irrigation and Debridement Calcaneus, Place Antibiotic Beads and Wound VAC ;  Surgeon: Newt Minion, MD;  Location: [place];  Service: Orthopedics;  Laterality: Right;  . HARDWARE REMOVAL Right 08/12/2015   Procedure: Removal Deep Hardware Right Foot;  Surgeon: Newt Minion,  MD;  Location: [place];  Service: Orthopedics;  Laterality: Right;  . HARDWARE REMOVAL Right 11/26/2018   Procedure: REMOVAL RIGHT TIBIOCALCANEAL NAIL;  Surgeon: Newt Minion, MD;  Location: [place];  Service: Orthopedics;  Laterality: Right;  . HARDWARE REMOVAL Right 12/23/2018   Procedure: RIGHT ANKLE REMOVE HARDWARE, PLACE ANTIBIOTIC BEADS and placement of wound vac;  Surgeon: Newt Minion, MD;  Location: [place];  Service: Orthopedics;  Laterality: Right;  . HARDWARE REMOVAL Right 09/29/2019   Procedure: RIGHT ANKLE REMOVE HARDWARE, DEBRIDEMENT;  Surgeon: Newt Minion, MD;  Location: [place];  Service: Orthopedics;  Laterality: Right;  . I & D EXTREMITY Right 09/15/2014   Procedure: IRRIGATION AND DEBRIDEMENT Ankle;  Surgeon: Renette Butters, MD;  Location: [place];  Service: Orthopedics;  Laterality: Right;  . INGUINAL HERNIA REPAIR Bilateral   . LAMINECTOMY WITH POSTERIOR LATERAL ARTHRODESIS LEVEL 4 N/A 09/10/2018   Procedure: Posterior Lateral Fusion - Thoracic Eleven-Thoracic Twelve - Thoracic Twelve-Lumbar One - Lumbar One-Lumbar Two - Lumbar Two-Lumbar Three with instrumentaion and PLA;  Surgeon: Kary Kos, MD;  Location: [place];  Service: Neurosurgery;  Laterality: N/A;  Posterior Lateral Fusion - Thoracic Eleven-Thoracic Twelve - Thoracic Twelve-Lumbar One - Lumbar One-Lumbar Two - Lumbar Two-Lumbar Thre  . LIVER BIOPSY N/A 07/06/2019   Procedure: LIVER BIOPSY;  Surgeon: Virl Cagey, MD;  [facility]: [facility];  Service: General;  Laterality: N/A;  . LUMBAR PERCUTANEOUS PEDICLE SCREW 1 LEVEL N/A 04/05/2017   Procedure: LUMBAR PERCUTANEOUS PEDICLE SCREW LUMBAR THREE-FOUR;  Surgeon: Kary Kos, MD;  Location: [place];  Service: Neurosurgery;  Laterality: N/A;  . ORIF CALCANEOUS FRACTURE Right 09/19/2014   Procedure: OPEN REDUCTION INTERNAL FIXATION (ORIF) CALCANEOUS FRACTURE;  Surgeon: Newt Minion, MD;  Location: [place];  Service: Orthopedics;  Laterality: Right;  . ORIF CALCANEOUS FRACTURE Left  09/19/2014   Procedure: OPEN REDUCTION INTERNAL FIXATION (ORIF) CALCANEOUS FRACTURE;  Surgeon: Newt Minion, MD;  Location: [place];  Service: Orthopedics;  Laterality: Left;  . POLYPECTOMY  06/05/2019   Procedure: POLYPECTOMY;  Surgeon: Rogene Houston, MD;  [facility];  Service: Endoscopy;;  cecal polyp biopsy forcep, ascending polyp cold snare    There were no vitals filed for this visit.   Subjective Assessment - 05/24/20 1027    Subjective He may be late on thursday 05/26/20 as he has MD appointment first for injections. He also will see biotech at 1 pm thursday. Today back pain is bad, has not been able to use his TENS as the batteries died.    Pertinent History Right TTA, Posterior Lateral Fusion T11-L3 09/2018, OA LBP & left knee, anxiety, depression    Limitations Standing;Walking;House hold activities    How long can you walk comfortably? 8/10 first walking then settles then 6/10. After wear couple hours back up to 8/10    Patient Stated Goals To return to work in [employer], use prosthesis  to be active    Pain Onset More than a month ago    Pain Onset More than a month ago    Pain Onset More than a month ago               [facility] Adult PT Treatment/Exercise - 05/24/20 0001      Transfers   Transfers Sit to Stand;Stand to Sit;Floor to Transfer    Sit to Stand 5: Supervision;With upper extremity assist;With armrests;From chair/3-in-1    Stand to Sit 5: Supervision;With upper extremity assist;With armrests;To chair/3-in-1      Ambulation/Gait   Ambulation/Gait Yes    Ambulation/Gait Assistance 5: Supervision    Ambulation Distance (Feet) 200 Feet    Assistive device Prosthesis;None    Gait Pattern Step-through pattern;Decreased stance time - right;Decreased step length - left;Decreased hip/knee flexion - right;Decreased weight shift to right;Right hip hike;Right foot flat;Right flexed knee in stance;Left flexed knee in stance;Antalgic;Lateral hip  instability;Trunk flexed;Abducted- right      Lumbar Exercises: Stretches   Other Lumbar Stretch Exercise seated flexion stretch rolling out pball 10 sec X 10 reps      Lumbar Exercises: Aerobic   Nustep L6 X 8 min UE/LE      Lumbar Exercises: Standing   Row 20 reps    Theraband Level (Row) Level 2 (Red)    Shoulder Extension 20 reps    Theraband Level (Shoulder Extension) Level 2 (Red)      Knee/Hip Exercises: Machines for Strengthening   Cybex Knee Extension 10 lbs bilat push 2X10    Cybex Knee Flexion 25 lbs bilat pull 3X10    Cybex Leg Press Rt leg only 50 lbs 3X12 reps interspaced with 30 sec H.S. stretch on Rt. Then bilat push at 100 lbs 3X12 interspaced with Lt H.S. stretch       Moist Heat Therapy   Number Minutes Moist Heat 12 Minutes    Moist Heat Location Lumbar Spine      Electrical Stimulation   Electrical Stimulation Location lumbar    Electrical Stimulation Action TENS    Electrical Stimulation Parameters tolerance 12 min in supine with legs elevated on heat    Electrical Stimulation Goals Pain                  PT Education - 05/24/20 1122    Education Details machines he can do at gym    Person(s) Educated Patient    Methods Explanation;Verbal cues;Demonstration    Comprehension Verbalized understanding;Returned demonstration            PT Short Term Goals - 05/17/20 1527      PT SHORT TERM GOAL #1   Title Patient verbalizes proper limb care including care of limb pain.   (All STGs Target Date: 06/10/2020)    Time 4    Period Weeks    Status New    Target Date 06/10/20      PT SHORT TERM GOAL #2   Title Patient tolerates wear of prosthetic liner >12 hrs /day and prosthesis >6 of those hours.    Time 4    Period Weeks    Status Revised    Target Date 06/10/20      PT SHORT TERM GOAL #3   Title Patient reports residual limb pain </=6/10    Time 4    Period Weeks    Status Revised    Target Date 06/10/20      PT SHORT TERM GOAL #4  Title Patient able to lift 15# box with supervision.    Time 4    Period Weeks    Status Revised    Target Date 06/10/20      PT SHORT TERM GOAL #5   Title Patient ambulates 400' with prosthesis only with supervision.    Time 4    Period Weeks    Status Revised    Target Date 06/10/20             PT Long Term Goals - 04/19/20 1604      PT LONG TERM GOAL #1   Title Patient verbalizes & demonstrates understanding of proper prosthetic care to enable safe use of prosthesis. (All LTGs Target Date: 07/08/2020)    Time 12    Period Weeks    Status On-going    Target Date 07/08/20      PT LONG TERM GOAL #2   Title Patient tolerates prosthesis wear >90% of awake hours without skin issues & residual limb pain </=4/10.    Time 12    Period Weeks    Status On-going    Target Date 07/08/20      PT LONG TERM GOAL #3   Title Berg Balance >45/56 indicating lower fall risk.    Time 12    Period Weeks    Status On-going    Target Date 07/08/20      PT LONG TERM GOAL #4   Title Patient ambulates >1000' outdoors including grass with cane or less & prosthesis modified independent.    Time 12    Period Weeks    Status On-going    Target Date 07/08/20      PT LONG TERM GOAL #5   Title Patient negotiates ramps, curbs & stairs with cane or less & prosthesis modified independent.    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    Time 12    Period Weeks    Status On-going    Target Date 07/08/20      PT LONG TERM GOAL #7   Title Patient reports above tasks increases his chronic back & knee pain <2 increments on 0-10 scale.    Time 12    Period Weeks    Status On-going    Target Date 07/08/20                 Plan - 05/24/20 1125    Clinical Impression Statement continued to work on  strengthening and stretching for lumbar and knees as tolerated but sessions continue to be limited by chronic LBP. He will hoever get injection on thursday.    Personal Factors and Comorbidities Comorbidity 3+;Fitness;Past/Current Experience;Time since onset of injury/illness/exacerbation    Comorbidities Right TTA, Posterior Lateral Fusion T11-L3 09/2018, OA LBP & left knee, anxiety, depression    Examination-Activity Limitations Lift;Locomotion Level;Squat;Stairs;Stand;Transfers    Examination-Participation Restrictions Community Activity;Yard Work;Other   farm tasks   Stability/Clinical Decision Making Evolving/Moderate complexity    Rehab Potential Good    PT Frequency 2x / week    PT Duration 12 weeks    PT Treatment/Interventions ADLs/Self Care Home Management;DME Instruction;Moist Heat;Electrical Stimulation;Gait training;Stair training;Functional mobility training;Therapeutic activities;Therapeutic exercise;Balance training;Neuromuscular re-education;Patient/family education;Prosthetic Training;Manual techniques;Scar mobilization;Passive range of motion;Dry needling;Vestibular  PT Next Visit Plan work towards updated STGs, therapuetic exercise, prosthetic gait training & balance activities    Consulted and Agree with Plan of Care Patient           Patient will benefit from skilled therapeutic intervention in order to improve the following deficits and impairments:  Abnormal gait, Decreased activity tolerance, Decreased balance, Decreased endurance, Decreased knowledge of use of DME, Decreased mobility, Decreased range of motion, Decreased skin integrity, Decreased scar mobility, Decreased strength, Impaired flexibility, Postural dysfunction, Prosthetic Dependency, Pain  Visit Diagnosis: Other abnormalities of gait and mobility  Unsteadiness on feet  Pain in right lower leg  Chronic pain of left knee  Chronic midline low back pain without sciatica  Stiffness of right knee, not  elsewhere classified  Muscle weakness (generalized)  Stiffness of left knee, not elsewhere classified  History of falling     Problem List Patient Active Problem List   Diagnosis Date Noted  . Acute hematogenous osteomyelitis of right foot ([place]) 01/29/2020  . Post-traumatic arthritis of ankle, right   . Cutaneous abscess of right ankle   . Subacute osteomyelitis of right tibia ([place])   . Fatty liver 06/30/2019  . Calculus of gallbladder without cholecystitis without obstruction 06/30/2019  . Nausea without vomiting 05/13/2019  . Abdominal bloating 05/13/2019  . Wound infection following procedure 12/19/2018  . Malunion of joint fusion ([place]) 11/26/2018  . 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[facility])   . Status post lumbar spinal fusion 09/10/2018  . S/P ankle fusion 11/27/2017  . Nonunion of subtalar arthrodesis   . Avascular necrosis of talus, right ([place])   . Post-traumatic osteoarthritis, left ankle and foot 10/22/2017  . DDD (degenerative disc disease), lumbar 04/05/2017  . Fracture of L2 vertebra ([place]) 01/12/2015  . Acute osteomyelitis of calcaneum ([place]) 10/09/2014  . Dysuria 10/05/2014  . Cellulitis 10/05/2014  . Fall from ladder 09/21/2014  . L2 vertebral fracture ([place]) 09/21/2014  . Bilateral calcaneal fractures 09/21/2014  . Chronic pain 09/21/2014  . Acute blood loss anemia 09/21/2014  . Open fracture of both calcanei 09/15/2014    Silvestre Mesi 05/24/2020, 11:27 AM  [facility] Physical Therapy [address] [place], [place], [address] Phone: [phone]   Fax:  [phone]  Name: Bradley Espinoza MRN: [id number] Date of Birth: 04/03/1956

## 2020-05-26 ENCOUNTER — Other Ambulatory Visit: Payer: Self-pay

## 2020-05-26 ENCOUNTER — Ambulatory Visit (INDEPENDENT_AMBULATORY_CARE_PROVIDER_SITE_OTHER): Payer: PPO | Admitting: Physical Therapy

## 2020-05-26 DIAGNOSIS — G8929 Other chronic pain: Secondary | ICD-10-CM | POA: Diagnosis not present

## 2020-05-26 DIAGNOSIS — R2681 Unsteadiness on feet: Secondary | ICD-10-CM | POA: Diagnosis not present

## 2020-05-26 DIAGNOSIS — M79661 Pain in right lower leg: Secondary | ICD-10-CM | POA: Diagnosis not present

## 2020-05-26 DIAGNOSIS — M545 Low back pain, unspecified: Secondary | ICD-10-CM

## 2020-05-26 DIAGNOSIS — M25562 Pain in left knee: Secondary | ICD-10-CM

## 2020-05-26 DIAGNOSIS — R2689 Other abnormalities of gait and mobility: Secondary | ICD-10-CM

## 2020-05-26 DIAGNOSIS — M25662 Stiffness of left knee, not elsewhere classified: Secondary | ICD-10-CM

## 2020-05-26 DIAGNOSIS — M6281 Muscle weakness (generalized): Secondary | ICD-10-CM | POA: Diagnosis not present

## 2020-05-26 DIAGNOSIS — M25661 Stiffness of right knee, not elsewhere classified: Secondary | ICD-10-CM

## 2020-05-26 DIAGNOSIS — Z9181 History of falling: Secondary | ICD-10-CM

## 2020-05-26 DIAGNOSIS — M47816 Spondylosis without myelopathy or radiculopathy, lumbar region: Secondary | ICD-10-CM | POA: Diagnosis not present

## 2020-05-26 NOTE — Therapy (Signed)
[facility] [address] [place], [place], [address] Phone: [phone]   Fax:  [phone]  Physical Therapy Treatment  Patient Details  Name: Bradley Espinoza MRN: [id number] Date of Birth: May 05, 1956 Referring Provider (PT): Meridee Score, MD   Encounter Date: 05/26/2020   PT End of Session - 05/26/20 1057    Visit Number 12    Number of Visits 25    Date for PT Re-Evaluation 07/13/20    Authorization Type [employer]    Authorization Time Period $15 co-pay    Progress Note Due on Visit 10    PT Start Time 1019    PT Stop Time 1100    PT Time Calculation (min) 41 min    Activity Tolerance Patient tolerated treatment well;Patient limited by pain    Behavior During Therapy [facility] for tasks assessed/performed           Past Medical History:  Diagnosis Date  . Anxiety   . Arthritis    low back pain, lumbar radiculopathy  . Depression   . Fatty liver   . Fracture    B/L ankles  . GERD (gastroesophageal reflux disease)   . Headache(784.0)    allergy related   . History of kidney stones    passed stones  . History of stomach ulcers   . Retained orthopedic hardware    failed retained hardware right foot  . Wears glasses   . Wears glasses   . Wears partial dentures    upper    Past Surgical History:  Procedure Laterality Date  . AMPUTATION Right 01/29/2020   Procedure: RIGHT BELOW KNEE AMPUTATION;  Surgeon: Newt Minion, MD;  Location: [place];  Service: Orthopedics;  Laterality: Right;  . ANKLE FUSION Right 05/11/2015   Procedure: Right Posterior Arthroscopic Subtalar Arthrodesis;  Surgeon: Newt Minion, MD;  Location: [place];  Service: Orthopedics;  Laterality: Right;  . ANKLE FUSION Right 11/27/2017   Procedure: RIGHT TIBIOCALCANEAL FUSION;  Surgeon: Newt Minion, MD;  Location: [place];  Service: Orthopedics;  Laterality: Right;  . ANKLE FUSION Right 11/26/2018   Procedure: RIGHT ANTERIOR ANKLE FUSION, APPLY VAC;   Surgeon: Newt Minion, MD;  Location: [place];  Service: Orthopedics;  Laterality: Right;  . ANKLE FUSION Right 09/09/2019   Procedure: REVISION RIGHT ANKLE FUSION;  Surgeon: Newt Minion, MD;  Location: [place];  Service: Orthopedics;  Laterality: Right;  . ANTERIOR LAT LUMBAR FUSION N/A 04/05/2017   Procedure: Extreme Lateral Interbody Fusion - Lumbar three-lumbar four ,exploration of fusion Posterior augmentation with globus addition Removal hardware Lumbar one-three. Lumbar four-sacral one,  Removal internal bone growth stimulator;  Surgeon: Kary Kos, MD;  Location: [place];  Service: Neurosurgery;  Laterality: N/A;  . APPLICATION OF WOUND VAC  12/23/2018   Procedure: Application Of Wound Vac;  Surgeon: Newt Minion, MD;  Location: [place];  Service: Orthopedics;;  . BACK SURGERY  2004   x 2  . BIOPSY  06/05/2019   Procedure: BIOPSY;  Surgeon: Rogene Houston, MD;  [facility];  Service: Endoscopy;;  gastric biopsy  . CERVICAL SPINE SURGERY  2008  . CHOLECYSTECTOMY N/A 07/06/2019   Procedure: LAPAROSCOPIC CHOLECYSTECTOMY;  Surgeon: Virl Cagey, MD;  [facility]: [facility];  Service: General;  Laterality: N/A;  . COLONOSCOPY WITH PROPOFOL N/A 06/05/2019   Procedure: COLONOSCOPY WITH PROPOFOL;  Surgeon: Rogene Houston, MD;  [facility];  Service: Endoscopy;  Laterality: N/A;  .  ESOPHAGOGASTRODUODENOSCOPY    . ESOPHAGOGASTRODUODENOSCOPY (EGD) WITH PROPOFOL N/A 06/05/2019   Procedure: ESOPHAGOGASTRODUODENOSCOPY (EGD) WITH PROPOFOL;  Surgeon: Rogene Houston, MD;  [facility];  Service: Endoscopy;  Laterality: N/A;  1220pm  . HARDWARE REMOVAL Right 10/09/2014   Procedure: Removal Deep Hardware, Irrigation and Debridement Calcaneus, Place Antibiotic Beads and Wound VAC ;  Surgeon: Newt Minion, MD;  Location: [place];  Service: Orthopedics;  Laterality: Right;  . HARDWARE REMOVAL Right 08/12/2015   Procedure: Removal Deep Hardware Right Foot;  Surgeon: Newt Minion,  MD;  Location: [place];  Service: Orthopedics;  Laterality: Right;  . HARDWARE REMOVAL Right 11/26/2018   Procedure: REMOVAL RIGHT TIBIOCALCANEAL NAIL;  Surgeon: Newt Minion, MD;  Location: [place];  Service: Orthopedics;  Laterality: Right;  . HARDWARE REMOVAL Right 12/23/2018   Procedure: RIGHT ANKLE REMOVE HARDWARE, PLACE ANTIBIOTIC BEADS and placement of wound vac;  Surgeon: Newt Minion, MD;  Location: [place];  Service: Orthopedics;  Laterality: Right;  . HARDWARE REMOVAL Right 09/29/2019   Procedure: RIGHT ANKLE REMOVE HARDWARE, DEBRIDEMENT;  Surgeon: Newt Minion, MD;  Location: [place];  Service: Orthopedics;  Laterality: Right;  . I & D EXTREMITY Right 09/15/2014   Procedure: IRRIGATION AND DEBRIDEMENT Ankle;  Surgeon: Renette Butters, MD;  Location: [place];  Service: Orthopedics;  Laterality: Right;  . INGUINAL HERNIA REPAIR Bilateral   . LAMINECTOMY WITH POSTERIOR LATERAL ARTHRODESIS LEVEL 4 N/A 09/10/2018   Procedure: Posterior Lateral Fusion - Thoracic Eleven-Thoracic Twelve - Thoracic Twelve-Lumbar One - Lumbar One-Lumbar Two - Lumbar Two-Lumbar Three with instrumentaion and PLA;  Surgeon: Kary Kos, MD;  Location: [place];  Service: Neurosurgery;  Laterality: N/A;  Posterior Lateral Fusion - Thoracic Eleven-Thoracic Twelve - Thoracic Twelve-Lumbar One - Lumbar One-Lumbar Two - Lumbar Two-Lumbar Thre  . LIVER BIOPSY N/A 07/06/2019   Procedure: LIVER BIOPSY;  Surgeon: Virl Cagey, MD;  [facility]: [facility];  Service: General;  Laterality: N/A;  . LUMBAR PERCUTANEOUS PEDICLE SCREW 1 LEVEL N/A 04/05/2017   Procedure: LUMBAR PERCUTANEOUS PEDICLE SCREW LUMBAR THREE-FOUR;  Surgeon: Kary Kos, MD;  Location: [place];  Service: Neurosurgery;  Laterality: N/A;  . ORIF CALCANEOUS FRACTURE Right 09/19/2014   Procedure: OPEN REDUCTION INTERNAL FIXATION (ORIF) CALCANEOUS FRACTURE;  Surgeon: Newt Minion, MD;  Location: [place];  Service: Orthopedics;  Laterality: Right;  . ORIF CALCANEOUS FRACTURE Left  09/19/2014   Procedure: OPEN REDUCTION INTERNAL FIXATION (ORIF) CALCANEOUS FRACTURE;  Surgeon: Newt Minion, MD;  Location: [place];  Service: Orthopedics;  Laterality: Left;  . POLYPECTOMY  06/05/2019   Procedure: POLYPECTOMY;  Surgeon: Rogene Houston, MD;  [facility];  Service: Endoscopy;;  cecal polyp biopsy forcep, ascending polyp cold snare    There were no vitals filed for this visit.   Subjective Assessment - 05/26/20 1047    Subjective Had lumbar injections prior to PT visit today, MD did not want him to lift anything after    Pertinent History Right TTA, Posterior Lateral Fusion T11-L3 09/2018, OA LBP & left knee, anxiety, depression    Limitations Standing;Walking;House hold activities    How long can you walk comfortably? 8/10 first walking then settles then 6/10. After wear couple hours back up to 8/10    Patient Stated Goals To return to work in [employer], use prosthesis to be active    Pain Onset More than a month ago    Pain Onset More than a month ago  Pain Onset More than a month ago            [facility] Adult PT Treatment/Exercise - 05/26/20 0001      Transfers   Transfers Sit to Stand;Stand to Sit;Floor to Transfer    Sit to Stand 5: Supervision;With upper extremity assist;With armrests;From chair/3-in-1    Stand to Sit 5: Supervision;With upper extremity assist;With armrests;To chair/3-in-1      Ambulation/Gait   Ambulation/Gait Yes    Ambulation/Gait Assistance 5: Supervision    Ambulation Distance (Feet) 200 Feet    Assistive device Prosthesis;None    Gait Pattern Step-through pattern;Decreased stance time - right;Decreased step length - left;Decreased hip/knee flexion - right;Decreased weight shift to right;Right hip hike;Right foot flat;Right flexed knee in stance;Left flexed knee in stance;Antalgic;Lateral hip instability;Trunk flexed;Abducted- right      Lumbar Exercises: Stretches   Active Hamstring Stretch Right;Left;2 reps;30 seconds     Single Knee to Chest Stretch Right;Left;3 reps;30 seconds    Lower Trunk Rotation 5 reps;10 seconds    Other Lumbar Stretch Exercise seated flexion stretch rolling out pball 10 sec X 10 reps      Lumbar Exercises: Aerobic   Nustep L6 X 10 min UE/LE      Lumbar Exercises: Supine   Clam 20 reps    Clam Limitations green    Bridge 15 reps;5 seconds      Knee/Hip Exercises: Machines for Strengthening   Cybex Knee Extension 10 lbs bilat push 2X10    Cybex Knee Flexion 25 lbs bilat pull 3X10                    PT Short Term Goals - 05/17/20 1527      PT SHORT TERM GOAL #1   Title Patient verbalizes proper limb care including care of limb pain.   (All STGs Target Date: 06/10/2020)    Time 4    Period Weeks    Status New    Target Date 06/10/20      PT SHORT TERM GOAL #2   Title Patient tolerates wear of prosthetic liner >12 hrs /day and prosthesis >6 of those hours.    Time 4    Period Weeks    Status Revised    Target Date 06/10/20      PT SHORT TERM GOAL #3   Title Patient reports residual limb pain </=6/10    Time 4    Period Weeks    Status Revised    Target Date 06/10/20      PT SHORT TERM GOAL #4   Title Patient able to lift 15# box with supervision.    Time 4    Period Weeks    Status Revised    Target Date 06/10/20      PT SHORT TERM GOAL #5   Title Patient ambulates 400' with prosthesis only with supervision.    Time 4    Period Weeks    Status Revised    Target Date 06/10/20             PT Long Term Goals - 04/19/20 1604      PT LONG TERM GOAL #1   Title Patient verbalizes & demonstrates understanding of proper prosthetic care to enable safe use of prosthesis. (All LTGs Target Date: 07/08/2020)    Time 12    Period Weeks    Status On-going    Target Date 07/08/20      PT LONG TERM GOAL #2  Title Patient tolerates prosthesis wear >90% of awake hours without skin issues & residual limb pain </=4/10.    Time 12    Period Weeks    Status  On-going    Target Date 07/08/20      PT LONG TERM GOAL #3   Title Berg Balance >45/56 indicating lower fall risk.    Time 12    Period Weeks    Status On-going    Target Date 07/08/20      PT LONG TERM GOAL #4   Title Patient ambulates >1000' outdoors including grass with cane or less & prosthesis modified independent.    Time 12    Period Weeks    Status On-going    Target Date 07/08/20      PT LONG TERM GOAL #5   Title Patient negotiates ramps, curbs & stairs with cane or less & prosthesis modified independent.    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    Time 12    Period Weeks    Status On-going    Target Date 07/08/20      PT LONG TERM GOAL #7   Title Patient reports above tasks increases his chronic back & knee pain <2 increments on 0-10 scale.    Time 12    Period Weeks    Status On-going    Target Date 07/08/20                 Plan - 05/26/20 1059    Clinical Impression Statement He had injection today so held off on any lumbar strengthening instead focused on gentle lumbar stretching and leg machines as tolerated but still limited by back pain. Hopefullly injection will help resolve some of this. He will also see prosthetist today.    Personal Factors and Comorbidities Comorbidity 3+;Fitness;Past/Current Experience;Time since onset of injury/illness/exacerbation    Comorbidities Right TTA, Posterior Lateral Fusion T11-L3 09/2018, OA LBP & left knee, anxiety, depression    Examination-Activity Limitations Lift;Locomotion Level;Squat;Stairs;Stand;Transfers    Examination-Participation Restrictions Community Activity;Yard Work;Other   farm tasks   Stability/Clinical Decision Making Evolving/Moderate complexity    Rehab Potential Good    PT Frequency 2x / week    PT  Duration 12 weeks    PT Treatment/Interventions ADLs/Self Care Home Management;DME Instruction;Moist Heat;Electrical Stimulation;Gait training;Stair training;Functional mobility training;Therapeutic activities;Therapeutic exercise;Balance training;Neuromuscular re-education;Patient/family education;Prosthetic Training;Manual techniques;Scar mobilization;Passive range of motion;Dry needling;Vestibular    PT Next Visit Plan work towards updated STGs, therapuetic exercise, prosthetic gait training & balance activities    Consulted and Agree with Plan of Care Patient           Patient will benefit from skilled therapeutic intervention in order to improve the following deficits and impairments:  Abnormal gait, Decreased activity tolerance, Decreased balance, Decreased endurance, Decreased knowledge of use of DME, Decreased mobility, Decreased range of motion, Decreased skin integrity, Decreased scar mobility, Decreased strength, Impaired flexibility, Postural dysfunction, Prosthetic Dependency, Pain  Visit Diagnosis: Other abnormalities of gait and mobility  Unsteadiness on feet  Pain in right lower leg  Chronic pain of left knee  Chronic midline low back pain without sciatica  Stiffness of right knee, not elsewhere classified  Muscle weakness (generalized)  Stiffness of left knee, not elsewhere classified  History of falling     Problem List Patient Active Problem List   Diagnosis Date Noted  . Acute hematogenous osteomyelitis of right foot ([place]) 01/29/2020  . Post-traumatic arthritis of ankle, right   . Cutaneous abscess of right ankle   . Subacute osteomyelitis of right tibia ([place])   . Fatty liver 06/30/2019  . Calculus of gallbladder without cholecystitis without obstruction 06/30/2019  . Nausea without vomiting 05/13/2019  . Abdominal bloating 05/13/2019  . Wound infection following procedure 12/19/2018  . Malunion of joint fusion ([place]) 11/26/2018  . Arthrodesis malunion  ([facility])   . Status post lumbar spinal fusion 09/10/2018  . S/P ankle fusion 11/27/2017  . Nonunion of subtalar arthrodesis   . Avascular necrosis of talus, right ([place])   . Post-traumatic osteoarthritis, left ankle and foot 10/22/2017  . DDD (degenerative disc disease), lumbar 04/05/2017  . Fracture of L2 vertebra ([place]) 01/12/2015  . Acute osteomyelitis of calcaneum ([place]) 10/09/2014  . Dysuria 10/05/2014  . Cellulitis 10/05/2014  . Fall from ladder 09/21/2014  . L2 vertebral fracture ([place]) 09/21/2014  . Bilateral calcaneal fractures 09/21/2014  . Chronic pain 09/21/2014  . Acute blood loss anemia 09/21/2014  . Open fracture of both calcanei 09/15/2014    Silvestre Mesi 05/26/2020, 11:07 AM  [facility] [address] [place], [place], [address] Phone: [phone]   Fax:  [phone]  Name: Bradley Espinoza MRN: [id number] Date of Birth: 04-07-1956

## 2020-05-30 ENCOUNTER — Encounter: Payer: Self-pay | Admitting: Orthopedic Surgery

## 2020-05-30 NOTE — Progress Notes (Signed)
Office Visit Note   Patient: Bradley Espinoza           Date of Birth: 10-04-1956           MRN: [id number] Visit Date: 05/19/2020              Requested by: Celene Squibb, MD [place],  [place] [address] PCP: Celene Squibb, MD  Chief Complaint  Patient presents with  . Right Leg - Routine Post Op    01/29/20 right BKA       HPI: Patient is a 64 year old gentleman status post right transtibial amputation.  Patient had symptoms that may have been consistent with a neuroma and Robin asked [place] to follow-up to evaluate.  Patient states he is increased his Neurontin to 300 mg 3 times a day.  Patient states he feels better with his leg in place.  Patient also complains of some muscle spasms.  Assessment & Plan: Visit Diagnoses:  1. Acquired absence of right leg below knee ([place])   2. Post-traumatic osteoarthritis, left ankle and foot     Plan: Recommended coconut water for the muscle spasms recommended continue with Neurontin we will reevaluate.  Follow-Up Instructions: Return in about 4 weeks (around 06/16/2020).   Ortho Exam  Patient is alert, oriented, no adenopathy, well-dressed, normal affect, normal respiratory effort. Examination patient has no tenderness to palpation over the residual limb no evidence of neuroma.  The peroneal nerve is nontender to palpation there is no ulcers no cellulitis.  He has no swelling there is good hair growth.  No dystrophic changes.  Imaging: No results found. No images are attached to the encounter.  Labs: Lab Results  Component Value Date   HGBA1C 5.1 06/26/2016   ESRSEDRATE 9 12/08/2018   REPTSTATUS 10/04/2019 FINAL 09/29/2019   GRAMSTAIN  09/29/2019    ABUNDANT WBC PRESENT, PREDOMINANTLY PMN FEW GRAM POSITIVE COCCI    CULT  09/29/2019    FEW STAPHYLOCOCCUS AUREUS NO ANAEROBES ISOLATED Performed at [facility], Eugenio Saenz [address] [place], [place] [address]    LABORGA STAPHYLOCOCCUS AUREUS 09/29/2019     Lab  Results  Component Value Date   ALBUMIN 4.4 07/02/2019   ALBUMIN 4.3 08/12/2015   ALBUMIN 4.1 05/06/2015    No results found for: MG No results found for: VD25OH  No results found for: PREALBUMIN CBC EXTENDED Latest Ref Rng & Units 01/29/2020 09/29/2019 09/09/2019  WBC 4.0 - 10.5 K/uL 6.7 6.8 6.0  RBC 4.22 - 5.81 MIL/uL 5.34 4.67 5.72  HGB 13.0 - 17.0 g/dL 15.2 13.1 16.4  HCT 39 - 52 % 48.1 42.0 51.1  PLT 150 - 400 K/uL 221 305 232  NEUTROABS 1.7 - 7.7 K/uL - - -  LYMPHSABS 0.7 - 4.0 K/uL - - -     Body mass index is 19.39 kg/m.  Orders:  No orders of the defined types were placed in this encounter.  No orders of the defined types were placed in this encounter.    Procedures: No procedures performed  Clinical Data: No additional findings.  ROS:  All other systems negative, except as noted in the HPI. Review of Systems  Objective: Vital Signs: Ht 6' (1.829 m)   Wt 143 lb (64.9 kg)   BMI 19.39 kg/m   Specialty Comments:  No specialty comments available.  PMFS History: Patient Active Problem List   Diagnosis Date Noted  . Acute hematogenous osteomyelitis of right foot ([place]) 01/29/2020  .  Post-traumatic arthritis of ankle, right   . Cutaneous abscess of right ankle   . Subacute osteomyelitis of right tibia ([place])   . Fatty liver 06/30/2019  . Calculus of gallbladder without cholecystitis without obstruction 06/30/2019  . Nausea without vomiting 05/13/2019  . Abdominal bloating 05/13/2019  . Wound infection following procedure 12/19/2018  . Malunion of joint fusion ([place]) 11/26/2018  . 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[facility])   . Status post lumbar spinal fusion 09/10/2018  . S/P ankle fusion 11/27/2017  . Nonunion of subtalar arthrodesis   . Avascular necrosis of talus, right ([place])   . Post-traumatic osteoarthritis, left ankle and foot 10/22/2017  . DDD (degenerative disc disease), lumbar 04/05/2017  . Fracture of L2 vertebra ([place]) 01/12/2015  . Acute osteomyelitis  of calcaneum ([place]) 10/09/2014  . Dysuria 10/05/2014  . Cellulitis 10/05/2014  . Fall from ladder 09/21/2014  . L2 vertebral fracture ([place]) 09/21/2014  . Bilateral calcaneal fractures 09/21/2014  . Chronic pain 09/21/2014  . Acute blood loss anemia 09/21/2014  . Open fracture of both calcanei 09/15/2014   Past Medical History:  Diagnosis Date  . Anxiety   . Arthritis    low back pain, lumbar radiculopathy  . Depression   . Fatty liver   . Fracture    B/L ankles  . GERD (gastroesophageal reflux disease)   . Headache(784.0)    allergy related   . History of kidney stones    passed stones  . History of stomach ulcers   . Retained orthopedic hardware    failed retained hardware right foot  . Wears glasses   . Wears glasses   . Wears partial dentures    upper    Family History  Problem Relation Age of Onset  . Diabetes Father   . Cancer Other     Past Surgical History:  Procedure Laterality Date  . AMPUTATION Right 01/29/2020   Procedure: RIGHT BELOW KNEE AMPUTATION;  Surgeon: Newt Minion, MD;  Location: [place];  Service: Orthopedics;  Laterality: Right;  . ANKLE FUSION Right 05/11/2015   Procedure: Right Posterior Arthroscopic Subtalar Arthrodesis;  Surgeon: Newt Minion, MD;  Location: [place];  Service: Orthopedics;  Laterality: Right;  . ANKLE FUSION Right 11/27/2017   Procedure: RIGHT TIBIOCALCANEAL FUSION;  Surgeon: Newt Minion, MD;  Location: [place];  Service: Orthopedics;  Laterality: Right;  . ANKLE FUSION Right 11/26/2018   Procedure: RIGHT ANTERIOR ANKLE FUSION, APPLY VAC;  Surgeon: Newt Minion, MD;  Location: [place];  Service: Orthopedics;  Laterality: Right;  . ANKLE FUSION Right 09/09/2019   Procedure: REVISION RIGHT ANKLE FUSION;  Surgeon: Newt Minion, MD;  Location: [place];  Service: Orthopedics;  Laterality: Right;  . ANTERIOR LAT LUMBAR FUSION N/A 04/05/2017   Procedure: Extreme Lateral Interbody Fusion - Lumbar three-lumbar four ,exploration of fusion  Posterior augmentation with globus addition Removal hardware Lumbar one-three. Lumbar four-sacral one,  Removal internal bone growth stimulator;  Surgeon: Kary Kos, MD;  Location: [place];  Service: Neurosurgery;  Laterality: N/A;  . APPLICATION OF WOUND VAC  12/23/2018   Procedure: Application Of Wound Vac;  Surgeon: Newt Minion, MD;  Location: [facility];  Service: Orthopedics;;  . BACK SURGERY  2004   x 2  . BIOPSY  06/05/2019   Procedure: BIOPSY;  Surgeon: Rogene Houston, MD;  [facility];  Service: Endoscopy;;  gastric biopsy  . CERVICAL SPINE SURGERY  2008  . CHOLECYSTECTOMY N/A 07/06/2019   Procedure: LAPAROSCOPIC  CHOLECYSTECTOMY;  Surgeon: Virl Cagey, MD;  [facility];  Service: General;  Laterality: N/A;  . COLONOSCOPY WITH PROPOFOL N/A 06/05/2019   Procedure: COLONOSCOPY WITH PROPOFOL;  Surgeon: Rogene Houston, MD;  [facility];  Service: Endoscopy;  Laterality: N/A;  . ESOPHAGOGASTRODUODENOSCOPY    . ESOPHAGOGASTRODUODENOSCOPY (EGD) WITH PROPOFOL N/A 06/05/2019   Procedure: ESOPHAGOGASTRODUODENOSCOPY (EGD) WITH PROPOFOL;  Surgeon: Rogene Houston, MD;  [facility];  Service: Endoscopy;  Laterality: N/A;  1220pm  . HARDWARE REMOVAL Right 10/09/2014   Procedure: Removal Deep Hardware, Irrigation and Debridement Calcaneus, Place Antibiotic Beads and Wound VAC ;  Surgeon: Newt Minion, MD;  Location: [place];  Service: Orthopedics;  Laterality: Right;  . HARDWARE REMOVAL Right 08/12/2015   Procedure: Removal Deep Hardware Right Foot;  Surgeon: Newt Minion, MD;  Location: [place];  Service: Orthopedics;  Laterality: Right;  . HARDWARE REMOVAL Right 11/26/2018   Procedure: REMOVAL RIGHT TIBIOCALCANEAL NAIL;  Surgeon: Newt Minion, MD;  Location: [place];  Service: Orthopedics;  Laterality: Right;  . HARDWARE REMOVAL Right 12/23/2018   Procedure: RIGHT ANKLE REMOVE HARDWARE, PLACE ANTIBIOTIC BEADS and placement of wound vac;  Surgeon: Newt Minion,  MD;  Location: [place];  Service: Orthopedics;  Laterality: Right;  . HARDWARE REMOVAL Right 09/29/2019   Procedure: RIGHT ANKLE REMOVE HARDWARE, DEBRIDEMENT;  Surgeon: Newt Minion, MD;  Location: [place];  Service: Orthopedics;  Laterality: Right;  . I & D EXTREMITY Right 09/15/2014   Procedure: IRRIGATION AND DEBRIDEMENT Ankle;  Surgeon: Renette Butters, MD;  Location: [place];  Service: Orthopedics;  Laterality: Right;  . INGUINAL HERNIA REPAIR Bilateral   . LAMINECTOMY WITH POSTERIOR LATERAL ARTHRODESIS LEVEL 4 N/A 09/10/2018   Procedure: Posterior Lateral Fusion - Thoracic Eleven-Thoracic Twelve - Thoracic Twelve-Lumbar One - Lumbar One-Lumbar Two - Lumbar Two-Lumbar Three with instrumentaion and PLA;  Surgeon: Kary Kos, MD;  Location: [place];  Service: Neurosurgery;  Laterality: N/A;  Posterior Lateral Fusion - Thoracic Eleven-Thoracic Twelve - Thoracic Twelve-Lumbar One - Lumbar One-Lumbar Two - Lumbar Two-Lumbar Thre  . LIVER BIOPSY N/A 07/06/2019   Procedure: LIVER BIOPSY;  Surgeon: Virl Cagey, MD;  [facility];  Service: General;  Laterality: N/A;  . LUMBAR PERCUTANEOUS PEDICLE SCREW 1 LEVEL N/A 04/05/2017   Procedure: LUMBAR PERCUTANEOUS PEDICLE SCREW LUMBAR THREE-FOUR;  Surgeon: Kary Kos, MD;  Location: [place];  Service: Neurosurgery;  Laterality: N/A;  . ORIF CALCANEOUS FRACTURE Right 09/19/2014   Procedure: OPEN REDUCTION INTERNAL FIXATION (ORIF) CALCANEOUS FRACTURE;  Surgeon: Newt Minion, MD;  Location: [place];  Service: Orthopedics;  Laterality: Right;  . ORIF CALCANEOUS FRACTURE Left 09/19/2014   Procedure: OPEN REDUCTION INTERNAL FIXATION (ORIF) CALCANEOUS FRACTURE;  Surgeon: Newt Minion, MD;  Location: [place];  Service: Orthopedics;  Laterality: Left;  . POLYPECTOMY  06/05/2019   Procedure: POLYPECTOMY;  Surgeon: Rogene Houston, MD;  [facility];  Service: Endoscopy;;  cecal polyp biopsy forcep, ascending polyp cold snare   Social History   Occupational  History    Comment: disabled  Tobacco Use  . Smoking status: Former Smoker    Packs/day: 1.00    Years: 10.00    Pack years: 10.00    Types: Cigars    Start date: 10/08/1976    Quit date: 09/2017    Years since quitting: 2.7  . Smokeless tobacco: Never Used  . Tobacco comment: quit smoking cegar 09/10/2018  Vaping  Use  . Vaping Use: Never used  Substance and Sexual Activity  . Alcohol use: No    Comment:  quit 1998  . Drug use: No  . Sexual activity: Yes    Birth control/protection: None

## 2020-05-31 ENCOUNTER — Other Ambulatory Visit: Payer: Self-pay

## 2020-05-31 ENCOUNTER — Ambulatory Visit: Payer: PPO | Admitting: Physical Therapy

## 2020-05-31 DIAGNOSIS — M545 Low back pain, unspecified: Secondary | ICD-10-CM

## 2020-05-31 DIAGNOSIS — M79661 Pain in right lower leg: Secondary | ICD-10-CM

## 2020-05-31 DIAGNOSIS — M6281 Muscle weakness (generalized): Secondary | ICD-10-CM | POA: Diagnosis not present

## 2020-05-31 DIAGNOSIS — M25562 Pain in left knee: Secondary | ICD-10-CM

## 2020-05-31 DIAGNOSIS — R2689 Other abnormalities of gait and mobility: Secondary | ICD-10-CM

## 2020-05-31 DIAGNOSIS — Z9181 History of falling: Secondary | ICD-10-CM

## 2020-05-31 DIAGNOSIS — G8929 Other chronic pain: Secondary | ICD-10-CM

## 2020-05-31 DIAGNOSIS — M25661 Stiffness of right knee, not elsewhere classified: Secondary | ICD-10-CM

## 2020-05-31 DIAGNOSIS — R2681 Unsteadiness on feet: Secondary | ICD-10-CM

## 2020-05-31 NOTE — Therapy (Signed)
[place] °[facility] °[address] °[place], [place], [address] °Phone: [phone]   Fax:  [phone] ° °Physical Therapy Treatment/Discharge °PHYSICAL THERAPY DISCHARGE SUMMARY ° °Visits from Start of Care: 13 ° °Current functional level related to goals / functional outcomes: °See below °  °Remaining deficits: °See below °  °Education / Equipment: °HEP °Plan: °Patient agrees to discharge.  Patient goals were partially met. Patient is being discharged due to the patient's request.  ?????  ° ° ° ° ° °Patient Details  °Name: Bradley Espinoza °MRN: [id number] °Date of Birth: 07/25/1956 °Referring Provider (PT): Marcus Duda, MD ° ° °Encounter Date: 05/31/2020 ° ° PT End of Session - 05/31/20 1121   ° Visit Number 13   ° Number of Visits 25   ° Date for PT Re-Evaluation 07/13/20   ° Authorization Type [employer] P[employer]   ° Authorization Time Period $15 co-pay   ° Progress Note Due on Visit 10   ° PT Start Time 1015   ° PT Stop Time 1100   ° PT Time Calculation (min) 45 min   ° Activity Tolerance Patient tolerated treatment well;Patient limited by pain   ° Behavior During Therapy WFL for tasks assessed/performed   °  °  °  ° ° °Past Medical History:  °Diagnosis Date  °• Anxiety   °• Arthritis   ° low back pain, lumbar radiculopathy  °• Depression   °• Fatty liver   °• Fracture   ° B/L ankles  °• GERD (gastroesophageal reflux disease)   °• Headache(784.0)   ° allergy related   °• History of kidney stones   ° passed stones  °• History of stomach ulcers   °• Retained orthopedic hardware   ° failed retained hardware right foot  °• Wears glasses   °• Wears glasses   °• Wears partial dentures   ° upper  ° ° °Past Surgical History:  °Procedure Laterality Date  °• AMPUTATION Right 01/29/2020  ° Procedure: RIGHT BELOW KNEE AMPUTATION;  Surgeon: Duda, Marcus V, MD;  [facility];  Service: Orthopedics;  Laterality: Right;  °• ANKLE FUSION Right 05/11/2015  ° Procedure: Right Posterior  Arthroscopic Subtalar Arthrodesis;  Surgeon: Marcus Duda V, MD;  [facility];  Service: Orthopedics;  Laterality: Right;  °• ANKLE FUSION Right 11/27/2017  ° Procedure: RIGHT TIBIOCALCANEAL FUSION;  Surgeon: Duda, Marcus V, MD;  [facility];  Service: Orthopedics;  Laterality: Right;  °• ANKLE FUSION Right 11/26/2018  ° Procedure: RIGHT ANTERIOR ANKLE FUSION, APPLY VAC;  Surgeon: Duda, Marcus V, MD;  [facility];  Service: Orthopedics;  Laterality: Right;  °• ANKLE FUSION Right 09/09/2019  ° Procedure: REVISION RIGHT ANKLE FUSION;  Surgeon: Duda, Marcus V, MD;  [facility];  Service: Orthopedics;  Laterality: Right;  °• ANTERIOR LAT LUMBAR FUSION N/A 04/05/2017  ° Procedure: Extreme Lateral Interbody Fusion - Lumbar three-lumbar four ,exploration of fusion Posterior augmentation with globus addition Removal hardware Lumbar one-three. Lumbar four-sacral one,  Removal internal bone growth stimulator;  Surgeon: Cram, Gary, MD;  [facility];  Service: Neurosurgery;  Laterality: N/A;  °• APPLICATION OF WOUND VAC  12/23/2018  ° Procedure: Application Of Wound Vac;  Surgeon: Duda, Marcus V, MD;  [facility];  Service: Orthopedics;;  °• BACK SURGERY  2004  ° x 2  °• BIOPSY  06/05/2019  ° Procedure: BIOPSY;  Surgeon: Rehman, Najeeb U, MD;  [facility];  Service: Endoscopy;;  gastric biopsy  °•   CERVICAL SPINE SURGERY  2008  °• CHOLECYSTECTOMY N/A 07/06/2019  ° Procedure: LAPAROSCOPIC CHOLECYSTECTOMY;  Surgeon: Bridges, Lindsay C, MD;  [facility]: [facility];  Service: General;  Laterality: N/A;  °• COLONOSCOPY WITH PROPOFOL N/A 06/05/2019  ° Procedure: COLONOSCOPY WITH PROPOFOL;  Surgeon: Rehman, Najeeb U, MD;  [facility];  Service: Endoscopy;  Laterality: N/A;  °• ESOPHAGOGASTRODUODENOSCOPY    °• ESOPHAGOGASTRODUODENOSCOPY (EGD) WITH PROPOFOL N/A 06/05/2019  ° Procedure: ESOPHAGOGASTRODUODENOSCOPY (EGD) WITH PROPOFOL;  Surgeon: Rehman, Najeeb U, MD;  [facility];  Service:  Endoscopy;  Laterality: N/A;  1220pm  °• HARDWARE REMOVAL Right 10/09/2014  ° Procedure: Removal Deep Hardware, Irrigation and Debridement Calcaneus, Place Antibiotic Beads and Wound VAC ;  Surgeon: Marcus Duda V, MD;  [facility];  Service: Orthopedics;  Laterality: Right;  °• HARDWARE REMOVAL Right 08/12/2015  ° Procedure: Removal Deep Hardware Right Foot;  Surgeon: Marcus Duda V, MD;  [facility];  Service: Orthopedics;  Laterality: Right;  °• HARDWARE REMOVAL Right 11/26/2018  ° Procedure: REMOVAL RIGHT TIBIOCALCANEAL NAIL;  Surgeon: Duda, Marcus V, MD;  [facility];  Service: Orthopedics;  Laterality: Right;  °• HARDWARE REMOVAL Right 12/23/2018  ° Procedure: RIGHT ANKLE REMOVE HARDWARE, PLACE ANTIBIOTIC BEADS and placement of wound vac;  Surgeon: Duda, Marcus V, MD;  [facility];  Service: Orthopedics;  Laterality: Right;  °• HARDWARE REMOVAL Right 09/29/2019  ° Procedure: RIGHT ANKLE REMOVE HARDWARE, DEBRIDEMENT;  Surgeon: Duda, Marcus V, MD;  [facility];  Service: Orthopedics;  Laterality: Right;  °• I & D EXTREMITY Right 09/15/2014  ° Procedure: IRRIGATION AND DEBRIDEMENT Ankle;  Surgeon: Timothy D Murphy, MD;  [facility];  Service: Orthopedics;  Laterality: Right;  °• INGUINAL HERNIA REPAIR Bilateral   °• LAMINECTOMY WITH POSTERIOR LATERAL ARTHRODESIS LEVEL 4 N/A 09/10/2018  ° Procedure: Posterior Lateral Fusion - Thoracic Eleven-Thoracic Twelve - Thoracic Twelve-Lumbar One - Lumbar One-Lumbar Two - Lumbar Two-Lumbar Three with instrumentaion and PLA;  Surgeon: Cram, Gary, MD;  [facility];  Service: Neurosurgery;  Laterality: N/A;  Posterior Lateral Fusion - Thoracic Eleven-Thoracic Twelve - Thoracic Twelve-Lumbar One - Lumbar One-Lumbar Two - Lumbar Two-Lumbar Thre  °• LIVER BIOPSY N/A 07/06/2019  ° Procedure: LIVER BIOPSY;  Surgeon: Bridges, Lindsay C, MD;  [facility];  Service: General;  Laterality: N/A;  °• LUMBAR PERCUTANEOUS PEDICLE SCREW 1 LEVEL N/A 04/05/2017  °  Procedure: LUMBAR PERCUTANEOUS PEDICLE SCREW LUMBAR THREE-FOUR;  Surgeon: Cram, Gary, MD;  [facility];  Service: Neurosurgery;  Laterality: N/A;  °• ORIF CALCANEOUS FRACTURE Right 09/19/2014  ° Procedure: OPEN REDUCTION INTERNAL FIXATION (ORIF) CALCANEOUS FRACTURE;  Surgeon: Marcus Duda V, MD;  [facility];  Service: Orthopedics;  Laterality: Right;  °• ORIF CALCANEOUS FRACTURE Left 09/19/2014  ° Procedure: OPEN REDUCTION INTERNAL FIXATION (ORIF) CALCANEOUS FRACTURE;  Surgeon: Marcus Duda V, MD;  [facility];  Service: Orthopedics;  Laterality: Left;  °• POLYPECTOMY  06/05/2019  ° Procedure: POLYPECTOMY;  Surgeon: Rehman, Najeeb U, MD;  [facility];  Service: Endoscopy;;  cecal polyp biopsy forcep, ascending polyp cold snare  ° ° °There were no vitals filed for this visit. ° ° Subjective Assessment - 05/31/20 1019   ° Subjective The lumbar injections did not help. he relays this will be his last day as he [place] way away, and it is too costly and time consuming for him to come. He plans to keep up with HEP and join   the [employer] to continue to improve his function.   ° Pertinent History Right TTA, Posterior Lateral Fusion T11-L3 09/2018, OA LBP & left knee, anxiety, depression   ° Limitations Standing;Walking;House hold activities   ° How long can you walk comfortably? 8/10 first walking then settles then 6/10. 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   ° Pain Onset More than a month ago   ° Pain Onset More than a month ago   ° Pain Onset More than a month ago   °  °  °  ° ° ° ° ° [facility] PT Assessment - 05/31/20 0001   °  ° Assessment  ° Medical Diagnosis Right Transtibial Amputation   ° Referring Provider (PT) Marcus Duda, MD   ° Onset Date/Surgical Date 04/07/20   °  ° Transfers  ° Transfers Sit to Stand;Stand to Sit;Floor to Transfer   ° Sit to Stand 5: Supervision;With upper extremity assist;With  armrests;From chair/3-in-1   ° Stand to Sit 5: Supervision;With upper extremity assist;With armrests;To chair/3-in-1   °  ° Berg Balance Test  ° Sit to Stand Able to stand without using hands and stabilize independently   ° Standing Unsupported Able to stand safely 2 minutes   ° Sitting with Back Unsupported but Feet Supported on Floor or Stool Able to sit safely and securely 2 minutes   ° Stand to Sit Sits safely with minimal use of hands   ° Transfers Able to transfer safely, minor use of hands   ° Standing Unsupported with Eyes Closed Able to stand 10 seconds safely   ° Standing Unsupported with Feet Together Able to place feet together independently and stand 1 minute safely   ° From Standing, Reach Forward with Outstretched Arm Can reach forward >12 cm safely (5")   ° From Standing Position, Pick up Object from Floor Able to pick up shoe safely and easily   ° From Standing Position, Turn to Look Behind Over each Shoulder Looks behind from both sides and weight shifts well   ° Turn 360 Degrees Able to turn 360 degrees safely one side only in 4 seconds or less   ° Standing Unsupported, Alternately Place Feet on Step/Stool Able to stand independently and safely and complete 8 steps in 20 seconds   ° Standing Unsupported, One Foot in Front Able to plae foot ahead of the other independently and hold 30 seconds   ° Standing on One Leg Able to lift leg independently and hold 5-10 seconds   ° Total Score 52   °  °  °  ° ° ° ° ° ° ° ° ° ° ° ° ° ° ° ° [facility] Adult PT Treatment/Exercise - 05/31/20 0001   °  ° Ambulation/Gait  ° Ambulation/Gait Yes   ° Ambulation/Gait Assistance 6: Modified independent (Device/Increase time)   ° Ambulation Distance (Feet) 400 Feet   ° Assistive device Prosthesis;None   ° Gait Pattern Step-through pattern;Decreased stance time - right;Decreased step length - left;Decreased hip/knee flexion - right;Decreased weight shift to right;Right hip hike;Right foot flat;Right flexed knee in stance;Left  flexed knee in stance;Antalgic;Lateral hip instability;Trunk flexed;Abducted- right   ° Ramp 6: Modified independent (Device)   ° Curb 6: Modified independent (Device/increase time)   °  ° Lumbar Exercises: Stretches  ° Active Hamstring Stretch Right;Left;2 reps;30 seconds   ° Other Lumbar Stretch Exercise seated   flexion stretch rolling out pball 10 sec X 10 reps   °  ° Lumbar Exercises: Aerobic  ° Recumbent Bike L2 X 10 min   °  ° Knee/Hip Exercises: Machines for Strengthening  ° Cybex Knee Extension 10 lbs bilat push 2X10   ° Cybex Leg Press Rt leg only 50 lbs 3X15 reps Then bilat push at 100 lbs 3X15   °  °  °  °Functional deadlift from floor picking up 10 lbs for 5 reps progressed to 15 lbs for 10 reps without difficulty or complaints. ° ° ° ° ° ° ° ° ° ° PT Short Term Goals - 05/31/20 1117   °  ° PT SHORT TERM GOAL #1  ° Title Patient verbalizes proper limb care including care of limb pain.   (All STGs Target Date: 06/10/2020)   ° Time 4   ° Period Weeks   ° Status Achieved   ° Target Date 06/10/20   °  ° PT SHORT TERM GOAL #2  ° Title Patient tolerates wear of prosthetic liner >12 hrs /day and prosthesis >6 of those hours.   ° Time 4   ° Period Weeks   ° Status Achieved   ° Target Date 06/10/20   °  ° PT SHORT TERM GOAL #3  ° Title Patient reports residual limb pain </=6/10   ° Time 4   ° Period Weeks   ° Status Achieved   ° Target Date 06/10/20   °  ° PT SHORT TERM GOAL #4  ° Title Patient able to lift 15# box with supervision.   ° Time 4   ° Period Weeks   ° Status Achieved   ° Target Date 06/10/20   °  ° PT SHORT TERM GOAL #5  ° Title Patient ambulates 400' with prosthesis only with supervision.   ° Time 4   ° Period Weeks   ° Status Achieved   ° Target Date 06/10/20   °  °  °  ° ° ° ° PT Long Term Goals - 05/31/20 1119   °  ° PT LONG TERM GOAL #1  ° Title Patient verbalizes & demonstrates understanding of proper prosthetic care to enable safe use of prosthesis. (All LTGs Target Date: 07/08/2020)   ° Time 12     ° Period Weeks   ° Status Achieved   °  ° PT LONG TERM GOAL #2  ° Title Patient tolerates prosthesis wear >90% of awake hours without skin issues & residual limb pain </=4/10.   ° Time 12   ° Period Weeks   ° Status Achieved   °  ° PT LONG TERM GOAL #3  ° Title Berg Balance >45/56 indicating lower fall risk.   ° Baseline 52 today   ° Time 12   ° Period Weeks   ° Status Achieved   °  ° PT LONG TERM GOAL #4  ° Title Patient ambulates >1000' outdoors including grass with cane or less & prosthesis modified independent.   ° Time 12   ° Period Weeks   ° Status Partially Met   °  ° PT LONG TERM GOAL #5  ° Title Patient negotiates ramps, curbs & stairs with cane or less & prosthesis modified independent.   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   ° Time 12   ° Period Weeks   ° Status Partially Met   °  ° PT LONG TERM GOAL #7  ° Title Patient reports above tasks increases his chronic back & knee pain <2 increments on 0-10 scale.   ° Baseline limited by back pain   ° Time 12   ° Period Weeks   ° Status Not Met   °  °  °  ° ° ° ° ° ° ° ° Plan - 05/31/20 1116   ° Clinical Impression Statement He has met most of his PT goals and feels ready for discharge. He has no questions or concerns about his prosthesis or discharge.   ° Personal Factors and Comorbidities Comorbidity 3+;Fitness;Past/Current Experience;Time since onset of injury/illness/exacerbation   ° Comorbidities Right TTA, Posterior Lateral Fusion T11-L3 09/2018, OA LBP & left knee, anxiety, depression   ° Examination-Activity Limitations Lift;Locomotion Level;Squat;Stairs;Stand;Transfers   ° Examination-Participation Restrictions Community Activity;Yard Work;Other   farm tasks  ° Stability/Clinical Decision Making Evolving/Moderate complexity   ° Rehab Potential Good   ° PT Frequency 2x / week   ° PT  Duration 12 weeks   ° PT Treatment/Interventions ADLs/Self Care Home Management;DME Instruction;Moist Heat;Electrical Stimulation;Gait training;Stair training;Functional mobility training;Therapeutic activities;Therapeutic exercise;Balance training;Neuromuscular re-education;Patient/family education;Prosthetic Training;Manual techniques;Scar mobilization;Passive range of motion;Dry needling;Vestibular   ° PT Next Visit Plan work towards updated STGs, therapuetic exercise, prosthetic gait training & balance activities   ° Consulted and Agree with Plan of Care Patient   °  °  °  ° ° °Patient will benefit from skilled therapeutic intervention in order to improve the following deficits and impairments:  Abnormal gait, Decreased activity tolerance, Decreased balance, Decreased endurance, Decreased knowledge of use of DME, Decreased mobility, Decreased range of motion, Decreased skin integrity, Decreased scar mobility, Decreased strength, Impaired flexibility, Postural dysfunction, Prosthetic Dependency, Pain ° °Visit Diagnosis: °Other abnormalities of gait and mobility ° °Unsteadiness on feet ° °Pain in right lower leg ° °Chronic pain of left knee ° °Chronic midline low back pain without sciatica ° °Stiffness of right knee, not elsewhere classified ° °Muscle weakness (generalized) ° °History of falling ° ° ° ° °Problem List °Patient Active Problem List  ° Diagnosis Date Noted  °• Acute hematogenous osteomyelitis of right foot (HCC) 01/29/2020  °• Post-traumatic arthritis of ankle, right   °• Cutaneous abscess of right ankle   °• Subacute osteomyelitis of right tibia (HCC)   °• Fatty liver 06/30/2019  °• Calculus of gallbladder without cholecystitis without obstruction 06/30/2019  °• Nausea without vomiting 05/13/2019  °• Abdominal bloating 05/13/2019  °• Wound infection following procedure 12/19/2018  °• Malunion of joint fusion (HCC) 11/26/2018  °• 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HCC)   °• Status post lumbar spinal fusion  09/10/2018  °• S/P ankle fusion 11/27/2017  °• Nonunion of subtalar arthrodesis   °• Avascular necrosis of talus, right (HCC)   °• Post-traumatic osteoarthritis, left ankle and foot 10/22/2017  °• DDD (degenerative disc disease), lumbar 04/05/2017  °• Fracture of L2 vertebra (HCC) 01/12/2015  °• Acute osteomyelitis of calcaneum (HCC) 10/09/2014  °• Dysuria 10/05/2014  °• Cellulitis 10/05/2014  °• Fall from ladder 09/21/2014  °• L2 vertebral fracture (HCC) 09/21/2014  °• Bilateral calcaneal fractures 09/21/2014  °• Chronic pain 09/21/2014  °• Acute blood loss anemia 09/21/2014  °• Open fracture of both calcanei 09/15/2014  ° ° °Brian R Nelson,PT,DPT °05/31/2020, 11:24 AM ° °[place] °[facility] °[address] °[place], [place], [address] °Phone: [phone]   Fax:  [phone] ° °Name: Keiland E Onorato °MRN: [id number] °Date of Birth: 08/10/1956 ° ° °

## 2020-06-02 ENCOUNTER — Encounter: Payer: PPO | Admitting: Physical Therapy

## 2020-06-07 ENCOUNTER — Encounter: Payer: PPO | Admitting: Physical Therapy

## 2020-06-07 NOTE — Progress Notes (Signed)
error 

## 2020-06-09 ENCOUNTER — Encounter: Payer: PPO | Admitting: Physical Therapy

## 2020-06-14 ENCOUNTER — Other Ambulatory Visit (INDEPENDENT_AMBULATORY_CARE_PROVIDER_SITE_OTHER): Payer: Self-pay | Admitting: Gastroenterology

## 2020-06-14 ENCOUNTER — Encounter: Payer: PPO | Admitting: Physical Therapy

## 2020-06-16 ENCOUNTER — Other Ambulatory Visit: Payer: Self-pay

## 2020-06-16 ENCOUNTER — Encounter (INDEPENDENT_AMBULATORY_CARE_PROVIDER_SITE_OTHER): Payer: Self-pay | Admitting: Gastroenterology

## 2020-06-16 ENCOUNTER — Encounter: Payer: PPO | Admitting: Physical Therapy

## 2020-06-16 ENCOUNTER — Ambulatory Visit (INDEPENDENT_AMBULATORY_CARE_PROVIDER_SITE_OTHER): Payer: PPO | Admitting: Gastroenterology

## 2020-06-16 VITALS — BP 157/52 | HR 76 | Temp 98.4°F | Ht 72.0 in | Wt 146.5 lb

## 2020-06-16 DIAGNOSIS — K219 Gastro-esophageal reflux disease without esophagitis: Secondary | ICD-10-CM

## 2020-06-16 DIAGNOSIS — R6881 Early satiety: Secondary | ICD-10-CM

## 2020-06-16 DIAGNOSIS — R634 Abnormal weight loss: Secondary | ICD-10-CM | POA: Diagnosis not present

## 2020-06-16 DIAGNOSIS — R110 Nausea: Secondary | ICD-10-CM

## 2020-06-16 MED ORDER — METOCLOPRAMIDE HCL 5 MG PO TABS: 5.0000 mg | ORAL_TABLET | Freq: Three times a day (TID) | ORAL | 2 refills | Status: DC

## 2020-06-16 MED ORDER — ESOMEPRAZOLE MAGNESIUM 40 MG PO CPDR
40.0000 mg | DELAYED_RELEASE_CAPSULE | Freq: Every day | ORAL | 3 refills | Status: DC
Start: 2020-06-16 — End: 2020-12-28

## 2020-06-16 NOTE — Patient Instructions (Addendum)
We are refilling reglan/metoclopromide - take 5mg  before meals and at bedtime - please call if you have any side effects as discussed (tremor, twitches, etc). We are trying switching from prilosec to nexium - stop prilosec when starting nexium. Please call in 4 weeks if not better. Otherwise follow up 6 months.

## 2020-06-16 NOTE — Progress Notes (Signed)
Patient profile: Bradley Espinoza is a 64 y.o. male seen for f/up of early satiety, nausea, weight loss.  History of Present Illness: Bradley Espinoza is seen today for evaluation of nausea, early satiety weight loss ongoing since gallbladder surgery September 2020.  He had an EGD and colonoscopy that was normal.  At last visit he was started on low-dose Reglan given daily Percocet 4 times daily.  Thyroid lab returned normal.  CT abdomen pelvis was ordered which did not reveal etiology of weight loss, did note large stool burden.  He is seen today for follow-up and reports having some improvement in symptoms. He definitely felt better when taking the reglan QID. Felt he had less nausea. Still has poor appetite. Occasionally has nausea w/ smelling foods. He denies any post prandial abd pain. He has been out of reglax over past week and symptoms seem worse. Does have some GERD symptoms as well. No dysphagia. Feels a lot of days "makes himself" eat something like a sandwhich.   He used miralax briefly after CT w/ constipation but currently feels he is moving bowels well without stool softener or miralax. Denies any blood in stool or lower abd pain.   Smokes 1 cigar daily. No alcohol or NSAIDs.  Wt Readings from Last 3 Encounters:  06/16/20 146 lb 8 oz (66.5 kg)  05/19/20 143 lb (64.9 kg)  04/28/20 143 lb (64.9 kg)  05/2019-weight #167 lbs    Last Colonoscopy: 05/2019-Normal esophagus. - Z-line irregular, 38 cm from the incisors. - 2 cm hiatal hernia. - Gastritis. Biopsied. - Normal duodenal bulb and second portion of the duodenum Last Endoscopy: 3 small polyps diverticulosis Biopsy results given to patient. Gastric biopsy negative for H. Pylori. Colonic polyps are tubular adenomas. Past Medical History:  Past Medical History:  Diagnosis Date  . Anxiety   . Arthritis    low back pain, lumbar radiculopathy  . Depression   . Fatty liver   . Fracture    B/L ankles  . GERD  (gastroesophageal reflux disease)   . Headache(784.0)    allergy related   . History of kidney stones    passed stones  . History of stomach ulcers   . Retained orthopedic hardware    failed retained hardware right foot  . Wears glasses   . Wears glasses   . Wears partial dentures    upper    Problem List: Patient Active Problem List   Diagnosis Date Noted  . Acute hematogenous osteomyelitis of right foot ([place]) 01/29/2020  . Post-traumatic arthritis of ankle, right   . Cutaneous abscess of right ankle   . Subacute osteomyelitis of right tibia ([place])   . Fatty liver 06/30/2019  . Calculus of gallbladder without cholecystitis without obstruction 06/30/2019  . Nausea without vomiting 05/13/2019  . Abdominal bloating 05/13/2019  . Wound infection following procedure 12/19/2018  . Malunion of joint fusion ([place]) 11/26/2018  . 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[facility])   . Status post lumbar spinal fusion 09/10/2018  . S/P ankle fusion 11/27/2017  . Nonunion of subtalar arthrodesis   . Avascular necrosis of talus, right ([place])   . Post-traumatic osteoarthritis, left ankle and foot 10/22/2017  . DDD (degenerative disc disease), lumbar 04/05/2017  . Fracture of L2 vertebra ([place]) 01/12/2015  . Acute osteomyelitis of calcaneum ([place]) 10/09/2014  . Dysuria 10/05/2014  . Cellulitis 10/05/2014  . Fall from ladder 09/21/2014  . L2 vertebral fracture ([place]) 09/21/2014  . Bilateral calcaneal fractures 09/21/2014  . Chronic  pain 09/21/2014  . Acute blood loss anemia 09/21/2014  . Open fracture of both calcanei 09/15/2014    Past Surgical History: Past Surgical History:  Procedure Laterality Date  . AMPUTATION Right 01/29/2020   Procedure: RIGHT BELOW KNEE AMPUTATION;  Surgeon: Newt Minion, MD;  Location: [facility];  Service: Orthopedics;  Laterality: Right;  . ANKLE FUSION Right 05/11/2015   Procedure: Right Posterior Arthroscopic Subtalar Arthrodesis;  Surgeon: Newt Minion, MD;  Location: [facility];  Service:  Orthopedics;  Laterality: Right;  . ANKLE FUSION Right 11/27/2017   Procedure: RIGHT TIBIOCALCANEAL FUSION;  Surgeon: Newt Minion, MD;  Location: [place];  Service: Orthopedics;  Laterality: Right;  . ANKLE FUSION Right 11/26/2018   Procedure: RIGHT ANTERIOR ANKLE FUSION, APPLY VAC;  Surgeon: Newt Minion, MD;  Location: [place];  Service: Orthopedics;  Laterality: Right;  . ANKLE FUSION Right 09/09/2019   Procedure: REVISION RIGHT ANKLE FUSION;  Surgeon: Newt Minion, MD;  Location: [facility];  Service: Orthopedics;  Laterality: Right;  . ANTERIOR LAT LUMBAR FUSION N/A 04/05/2017   Procedure: Extreme Lateral Interbody Fusion - Lumbar three-lumbar four ,exploration of fusion Posterior augmentation with globus addition Removal hardware Lumbar one-three. Lumbar four-sacral one,  Removal internal bone growth stimulator;  Surgeon: Kary Kos, MD;  Location: [facility];  Service: Neurosurgery;  Laterality: N/A;  . APPLICATION OF WOUND VAC  12/23/2018   Procedure: Application Of Wound Vac;  Surgeon: Newt Minion, MD;  Location: [facility];  Service: Orthopedics;;  . BACK SURGERY  2004   x 2  . BIOPSY  06/05/2019   Procedure: BIOPSY;  Surgeon: Rogene Houston, MD;  [facility];  Service: Endoscopy;;  gastric biopsy  . CERVICAL SPINE SURGERY  2008  . CHOLECYSTECTOMY N/A 07/06/2019   Procedure: LAPAROSCOPIC CHOLECYSTECTOMY;  Surgeon: Virl Cagey, MD;  [facility];  Service: General;  Laterality: N/A;  . COLONOSCOPY WITH PROPOFOL N/A 06/05/2019   Procedure: COLONOSCOPY WITH PROPOFOL;  Surgeon: Rogene Houston, MD;  [facility];  Service: Endoscopy;  Laterality: N/A;  . ESOPHAGOGASTRODUODENOSCOPY    . ESOPHAGOGASTRODUODENOSCOPY (EGD) WITH PROPOFOL N/A 06/05/2019   Procedure: ESOPHAGOGASTRODUODENOSCOPY (EGD) WITH PROPOFOL;  Surgeon: Rogene Houston, MD;  [facility];  Service: Endoscopy;  Laterality: N/A;  1220pm  . HARDWARE REMOVAL Right 10/09/2014   Procedure: Removal  Deep Hardware, Irrigation and Debridement Calcaneus, Place Antibiotic Beads and Wound VAC ;  Surgeon: Newt Minion, MD;  Location: [place];  Service: Orthopedics;  Laterality: Right;  . HARDWARE REMOVAL Right 08/12/2015   Procedure: Removal Deep Hardware Right Foot;  Surgeon: Newt Minion, MD;  Location: [facility];  Service: Orthopedics;  Laterality: Right;  . HARDWARE REMOVAL Right 11/26/2018   Procedure: REMOVAL RIGHT TIBIOCALCANEAL NAIL;  Surgeon: Newt Minion, MD;  Location: [place];  Service: Orthopedics;  Laterality: Right;  . HARDWARE REMOVAL Right 12/23/2018   Procedure: RIGHT ANKLE REMOVE HARDWARE, PLACE ANTIBIOTIC BEADS and placement of wound vac;  Surgeon: Newt Minion, MD;  Location: [place];  Service: Orthopedics;  Laterality: Right;  . HARDWARE REMOVAL Right 09/29/2019   Procedure: RIGHT ANKLE REMOVE HARDWARE, DEBRIDEMENT;  Surgeon: Newt Minion, MD;  Location: [place];  Service: Orthopedics;  Laterality: Right;  . I & D EXTREMITY Right 09/15/2014   Procedure: IRRIGATION AND DEBRIDEMENT Ankle;  Surgeon: Renette Butters, MD;  Location: [place];  Service: Orthopedics;  Laterality: Right;  . INGUINAL HERNIA REPAIR  Bilateral   . LAMINECTOMY WITH POSTERIOR LATERAL ARTHRODESIS LEVEL 4 N/A 09/10/2018   Procedure: Posterior Lateral Fusion - Thoracic Eleven-Thoracic Twelve - Thoracic Twelve-Lumbar One - Lumbar One-Lumbar Two - Lumbar Two-Lumbar Three with instrumentaion and PLA;  Surgeon: Kary Kos, MD;  Location: [facility];  Service: Neurosurgery;  Laterality: N/A;  Posterior Lateral Fusion - Thoracic Eleven-Thoracic Twelve - Thoracic Twelve-Lumbar One - Lumbar One-Lumbar Two - Lumbar Two-Lumbar Thre  . LIVER BIOPSY N/A 07/06/2019   Procedure: LIVER BIOPSY;  Surgeon: Virl Cagey, MD;  [facility];  Service: General;  Laterality: N/A;  . LUMBAR PERCUTANEOUS PEDICLE SCREW 1 LEVEL N/A 04/05/2017   Procedure: LUMBAR PERCUTANEOUS PEDICLE SCREW LUMBAR THREE-FOUR;  Surgeon: Kary Kos, MD;  Location: [place];  Service: Neurosurgery;  Laterality: N/A;  . ORIF CALCANEOUS FRACTURE Right 09/19/2014   Procedure: OPEN REDUCTION INTERNAL FIXATION (ORIF) CALCANEOUS FRACTURE;  Surgeon: Newt Minion, MD;  Location: [place];  Service: Orthopedics;  Laterality: Right;  . ORIF CALCANEOUS FRACTURE Left 09/19/2014   Procedure: OPEN REDUCTION INTERNAL FIXATION (ORIF) CALCANEOUS FRACTURE;  Surgeon: Newt Minion, MD;  Location: [facility];  Service: Orthopedics;  Laterality: Left;  . POLYPECTOMY  06/05/2019   Procedure: POLYPECTOMY;  Surgeon: Rogene Houston, MD;  [facility];  Service: Endoscopy;;  cecal polyp biopsy forcep, ascending polyp cold snare    Allergies: Allergies  Allergen Reactions  . Doxycycline Nausea Only  . Levofloxacin Other (See Comments)    Blister in mouth  . Codeine Nausea And Vomiting      Home Medications:  Current Outpatient Medications:  .  albuterol (VENTOLIN HFA) 108 (90 Base) MCG/ACT inhaler, Inhale 2 puffs into the lungs every 4 (four) hours as needed., Disp: , Rfl:  .  Cholecalciferol (VITAMIN D) 50 MCG (2000 UT) CAPS, Take 2,000 Units by mouth daily., Disp: , Rfl:  .  Coral Calcium 1000 (390 Ca) MG TABS, Take 1,000 mg by mouth daily., Disp: , Rfl:  .  docusate sodium (COLACE) 100 MG capsule, Take 100 mg by mouth daily as needed for mild constipation. , Disp: , Rfl:  .  gabapentin (NEURONTIN) 300 MG capsule, Take 300 mg by mouth at bedtime., Disp: , Rfl:  .  metoCLOPramide (REGLAN) 5 MG tablet, Take 1 tablet (5 mg total) by mouth 4 (four) times daily -  before meals and at bedtime., Disp: 360 tablet, Rfl: 2 .  ondansetron (ZOFRAN) 4 MG tablet, Take 1 tablet (4 mg total) by mouth 3 (three) times daily as needed for nausea or vomiting., Disp: 30 tablet, Rfl: 1 .  oxyCODONE-acetaminophen (PERCOCET) 10-325 MG tablet, Take 1 tablet by mouth every 4 (four) hours as needed for pain., Disp: 30 tablet, Rfl: 0 .  tamsulosin (FLOMAX) 0.4 MG CAPS capsule, Take 0.8 mg by mouth  daily. , Disp: , Rfl:  .  testosterone cypionate (DEPOTESTOSTERONE CYPIONATE) 200 MG/ML injection, Inject 200 mg into the muscle every 14 (fourteen) days., Disp: , Rfl:  .  vitamin C (ASCORBIC ACID) 500 MG tablet, Take 500 mg by mouth daily., Disp: , Rfl:  .  zinc gluconate 50 MG tablet, Take 50 mg by mouth daily., Disp: , Rfl:  .  esomeprazole (NEXIUM) 40 MG capsule, Take 1 capsule (40 mg total) by mouth daily before breakfast., Disp: 30 capsule, Rfl: 3 .  pregabalin (LYRICA) 50 MG capsule, Take 1 capsule (50 mg total) by mouth 3 (three) times daily. (Patient not taking: Reported on 06/16/2020), Disp: 90 capsule,  Rfl: 2 .  QUETIAPINE FUMARATE PO, Take 50 mg by mouth. Patient states that he takes 1/2 tablet at night as needed. (Patient not taking: Reported on 06/16/2020), Disp: , Rfl:  No current facility-administered medications for this visit.  Facility-Administered Medications Ordered in Other Visits:  .  0.45 % sodium chloride infusion, , Intravenous, Continuous, Rayburn, Shawn Montgomery, PA-C .  docusate sodium (COLACE) capsule 100 mg, 100 mg, Oral, BID, Rayburn, Shawn Montgomery, PA-C .  oxyCODONE (Oxy IR/ROXICODONE) immediate release tablet 5-10 mg, 5-10 mg, Oral, Q3H PRN, Rayburn, Neta Mends, PA-C .  senna (SENOKOT) tablet 8.6 mg, 1 tablet, Oral, BID, Rayburn, Shawn Montgomery, PA-C .  sorbitol 70 % solution 30 mL, 30 mL, Oral, Daily PRN, Rayburn, Neta Mends, PA-C   Family History: family history includes Cancer in an other family member; Diabetes in his father.    Social History:   reports that he quit smoking about 2 years ago. His smoking use included cigars. He started smoking about 43 years ago. He has a 10.00 pack-year smoking history. He has never used smokeless tobacco. He reports that he does not drink alcohol and does not use drugs.   Review of Systems: Constitutional: Denies weight loss/weight gain  Eyes: No changes in vision. ENT: No oral lesions, sore throat.    GI: see HPI.  Heme/Lymph: No easy bruising.  CV: No chest pain.  GU: No hematuria.  Integumentary: No rashes.  Neuro: No headaches.  Psych: No depression/anxiety.  Endocrine: No heat/cold intolerance.  Allergic/Immunologic: No urticaria.  Resp: No cough, SOB.  Musculoskeletal: No joint swelling.    Physical Examination: BP (!) 157/52 (BP Location: Right Arm, Patient Position: Sitting, Cuff Size: Small)   Pulse 76   Temp 98.4 F (36.9 C) (Oral)   Ht 6' (1.829 m)   Wt 146 lb 8 oz (66.5 kg)   BMI 19.87 kg/m  Gen: NAD, alert and oriented x 4 HEENT: PEERLA, EOMI, Neck: supple, no JVD Chest: CTA bilaterally, no wheezes, crackles, or other adventitious sounds CV: RRR, no m/g/c/r Abd: soft, NT, ND, +BS in all four quadrants; no HSM, guarding, ridigity, or rebound tenderness Ext: no edema, well perfused with 2+ pulses, Skin: no rash or lesions noted on observed skin Lymph: no noted LAD  Data Reviewed:     Assessment/Plan: Mr. Cassatt is a 64 y.o. male seen for evaluation of weight loss/nausea.   1. Nausea/wt loss -symptoms improved with Reglan 5 mg 4 times a day.  He does not have any side effects of the Reglan.  He is on chronic narcotics which are likely decreasing his gastric motility.  He had an EGD and CT scan recently unremarkable.  He does have some reflux symptoms and will try switching his PPI he has been on omeprazole a long time.  He in general just endorses a very poor appetite and would consider an appetite stimulant at some point if he continues to lose weight but fortunately has gained a few pounds since his last visit.  He will contact me in about 4 weeks if not improving, otherwise follow-up 6 months  2.  Constipation-he is using MiraLAX on an as-needed basis but currently feels he is moving stools well without MiraLAX or stool softener.  He is up-to-date on colon  Follow-up 6 months, call sooner if needed or if symptoms do not improve    [place] was seen  today for nausea.  Diagnoses and all orders for this visit:  Nausea without vomiting  Early  satiety  Chronic GERD  Loss of weight  Other orders -     metoCLOPramide (REGLAN) 5 MG tablet; Take 1 tablet (5 mg total) by mouth 4 (four) times daily -  before meals and at bedtime. -     esomeprazole (NEXIUM) 40 MG capsule; Take 1 capsule (40 mg total) by mouth daily before breakfast.    25 minutes total patient care time, greater than 50% face-to-face   I personally performed the service, non-incident to. (WP)  Laurine Blazer, [facility]

## 2020-06-20 DIAGNOSIS — J039 Acute tonsillitis, unspecified: Secondary | ICD-10-CM | POA: Diagnosis not present

## 2020-06-20 DIAGNOSIS — S6991XA Unspecified injury of right wrist, hand and finger(s), initial encounter: Secondary | ICD-10-CM | POA: Diagnosis not present

## 2020-06-20 DIAGNOSIS — R140 Abdominal distension (gaseous): Secondary | ICD-10-CM | POA: Diagnosis not present

## 2020-06-20 DIAGNOSIS — M5136 Other intervertebral disc degeneration, lumbar region: Secondary | ICD-10-CM | POA: Diagnosis not present

## 2020-06-20 DIAGNOSIS — R5383 Other fatigue: Secondary | ICD-10-CM | POA: Diagnosis not present

## 2020-06-20 DIAGNOSIS — E291 Testicular hypofunction: Secondary | ICD-10-CM | POA: Diagnosis not present

## 2020-06-20 DIAGNOSIS — Z77018 Contact with and (suspected) exposure to other hazardous metals: Secondary | ICD-10-CM | POA: Diagnosis not present

## 2020-06-20 DIAGNOSIS — Z0000 Encounter for general adult medical examination without abnormal findings: Secondary | ICD-10-CM | POA: Diagnosis not present

## 2020-06-20 DIAGNOSIS — L0291 Cutaneous abscess, unspecified: Secondary | ICD-10-CM | POA: Diagnosis not present

## 2020-06-20 DIAGNOSIS — E782 Mixed hyperlipidemia: Secondary | ICD-10-CM | POA: Diagnosis not present

## 2020-06-20 DIAGNOSIS — F5221 Male erectile disorder: Secondary | ICD-10-CM | POA: Diagnosis not present

## 2020-06-20 DIAGNOSIS — J029 Acute pharyngitis, unspecified: Secondary | ICD-10-CM | POA: Diagnosis not present

## 2020-06-21 ENCOUNTER — Encounter: Payer: PPO | Admitting: Physical Therapy

## 2020-06-23 ENCOUNTER — Encounter: Payer: PPO | Admitting: Physical Therapy

## 2020-06-23 DIAGNOSIS — Z981 Arthrodesis status: Secondary | ICD-10-CM | POA: Diagnosis not present

## 2020-06-23 DIAGNOSIS — Z89511 Acquired absence of right leg below knee: Secondary | ICD-10-CM | POA: Diagnosis not present

## 2020-06-23 DIAGNOSIS — S32001A Stable burst fracture of unspecified lumbar vertebra, initial encounter for closed fracture: Secondary | ICD-10-CM | POA: Diagnosis not present

## 2020-06-27 DIAGNOSIS — K5900 Constipation, unspecified: Secondary | ICD-10-CM | POA: Diagnosis not present

## 2020-06-27 DIAGNOSIS — F5221 Male erectile disorder: Secondary | ICD-10-CM | POA: Diagnosis not present

## 2020-06-27 DIAGNOSIS — E785 Hyperlipidemia, unspecified: Secondary | ICD-10-CM | POA: Diagnosis not present

## 2020-06-27 DIAGNOSIS — G894 Chronic pain syndrome: Secondary | ICD-10-CM | POA: Diagnosis not present

## 2020-06-27 DIAGNOSIS — G4700 Insomnia, unspecified: Secondary | ICD-10-CM | POA: Diagnosis not present

## 2020-06-27 DIAGNOSIS — E291 Testicular hypofunction: Secondary | ICD-10-CM | POA: Diagnosis not present

## 2020-06-27 DIAGNOSIS — N400 Enlarged prostate without lower urinary tract symptoms: Secondary | ICD-10-CM | POA: Diagnosis not present

## 2020-06-27 DIAGNOSIS — K219 Gastro-esophageal reflux disease without esophagitis: Secondary | ICD-10-CM | POA: Diagnosis not present

## 2020-06-27 DIAGNOSIS — Z89511 Acquired absence of right leg below knee: Secondary | ICD-10-CM | POA: Diagnosis not present

## 2020-06-27 DIAGNOSIS — R5383 Other fatigue: Secondary | ICD-10-CM | POA: Diagnosis not present

## 2020-06-28 ENCOUNTER — Encounter: Payer: PPO | Admitting: Physical Therapy

## 2020-06-30 ENCOUNTER — Encounter: Payer: PPO | Admitting: Physical Therapy

## 2020-07-07 ENCOUNTER — Encounter: Payer: PPO | Admitting: Physical Therapy

## 2020-07-12 ENCOUNTER — Ambulatory Visit (INDEPENDENT_AMBULATORY_CARE_PROVIDER_SITE_OTHER): Payer: PPO | Admitting: Internal Medicine

## 2020-07-21 ENCOUNTER — Ambulatory Visit (HOSPITAL_COMMUNITY): Payer: PPO | Admitting: Physical Therapy

## 2020-07-21 ENCOUNTER — Encounter (HOSPITAL_COMMUNITY): Payer: PPO | Admitting: Speech Pathology

## 2020-07-26 DIAGNOSIS — M47816 Spondylosis without myelopathy or radiculopathy, lumbar region: Secondary | ICD-10-CM | POA: Diagnosis not present

## 2020-08-08 DIAGNOSIS — M47816 Spondylosis without myelopathy or radiculopathy, lumbar region: Secondary | ICD-10-CM | POA: Diagnosis not present

## 2020-08-11 ENCOUNTER — Ambulatory Visit (INDEPENDENT_AMBULATORY_CARE_PROVIDER_SITE_OTHER): Payer: PPO | Admitting: Orthopedic Surgery

## 2020-08-11 ENCOUNTER — Encounter: Payer: Self-pay | Admitting: Orthopedic Surgery

## 2020-08-11 VITALS — Ht 72.0 in | Wt 146.0 lb

## 2020-08-11 DIAGNOSIS — Z89511 Acquired absence of right leg below knee: Secondary | ICD-10-CM

## 2020-08-19 ENCOUNTER — Encounter: Payer: Self-pay | Admitting: Orthopedic Surgery

## 2020-08-19 NOTE — Progress Notes (Signed)
Office Visit Note   Patient: Bradley Espinoza           Date of Birth: [birth year]           MRN: [id number] Visit Date: 08/11/2020              Requested by: Celene Squibb, MD [place],  [place] [address] PCP: Celene Squibb, MD  Chief Complaint  Patient presents with  . Right Leg - Follow-up    01/29/20 right BKA       HPI: Patient is a 64 year old gentleman who is status post a right transtibial amputation he is working with biotech he states that they are working on his fourth prosthetic socket.  He states he has pain over the lateral aspect of the residual limb that is rubbing in the socket he states the new socket has just been fabricated but he has not tried to wear it yet.  He states he takes his Neurontin 3-4 times a day.  He states he does have some pain in the left ankle.  Assessment & Plan: Visit Diagnoses:  1. Acquired absence of right leg below knee [facility])     Plan: Patient will follow up after he obtains his new prosthetic socket on the right.  Follow-Up Instructions: Return in about 4 weeks (around 09/08/2020).   Ortho Exam  Patient is alert, oriented, no adenopathy, well-dressed, normal affect, normal respiratory effort. Examination patient has good hair growth on the residual limb he has tenderness in the distribution of the peroneal nerve.  There are no skin breakdowns no cellulitis no ulcers he has a well consolidated residual limb.  He states he still has some neuropathy pain.  Imaging: No results found. No images are attached to the encounter.  Labs: Lab Results  Component Value Date   HGBA1C 5.1 06/26/2016   ESRSEDRATE 9 12/08/2018   REPTSTATUS 10/04/2019 FINAL 09/29/2019   GRAMSTAIN  09/29/2019    ABUNDANT WBC PRESENT, PREDOMINANTLY PMN FEW GRAM POSITIVE COCCI    CULT  09/29/2019    FEW STAPHYLOCOCCUS AUREUS NO ANAEROBES ISOLATED Performed at [facility], [place] [address], [place], [place] [address]    LABORGA  STAPHYLOCOCCUS AUREUS 09/29/2019     Lab Results  Component Value Date   ALBUMIN 4.4 07/02/2019   ALBUMIN 4.3 08/12/2015   ALBUMIN 4.1 05/06/2015    No results found for: MG No results found for: VD25OH  No results found for: PREALBUMIN CBC EXTENDED Latest Ref Rng & Units 01/29/2020 09/29/2019 09/09/2019  WBC 4.0 - 10.5 K/uL 6.7 6.8 6.0  RBC 4.22 - 5.81 MIL/uL 5.34 4.67 5.72  HGB 13.0 - 17.0 g/dL 15.2 13.1 16.4  HCT 39 - 52 % 48.1 42.0 51.1  PLT 150 - 400 K/uL 221 305 232  NEUTROABS 1.7 - 7.7 K/uL - - -  LYMPHSABS 0.7 - 4.0 K/uL - - -     Body mass index is 19.8 kg/m.  Orders:  No orders of the defined types were placed in this encounter.  No orders of the defined types were placed in this encounter.    Procedures: No procedures performed  Clinical Data: No additional findings.  ROS:  All other systems negative, except as noted in the HPI. Review of Systems  Objective: Vital Signs: Ht 6' (1.829 m)   Wt 146 lb (66.2 kg)   BMI 19.80 kg/m   Specialty Comments:  No specialty comments available.  PMFS History: Patient  Active Problem List   Diagnosis Date Noted  . Acute hematogenous osteomyelitis of right foot ([place]) 01/29/2020  . Post-traumatic arthritis of ankle, right   . Cutaneous abscess of right ankle   . Subacute osteomyelitis of right tibia ([place])   . Fatty liver 06/30/2019  . Calculus of gallbladder without cholecystitis without obstruction 06/30/2019  . Nausea without vomiting 05/13/2019  . Abdominal bloating 05/13/2019  . Wound infection following procedure 12/19/2018  . Malunion of joint fusion ([place]) 11/26/2018  . 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[facility])   . Status post lumbar spinal fusion 09/10/2018  . S/P ankle fusion 11/27/2017  . Nonunion of subtalar arthrodesis   . Avascular necrosis of talus, right ([place])   . Post-traumatic osteoarthritis, left ankle and foot 10/22/2017  . DDD (degenerative disc disease), lumbar 04/05/2017  . Fracture of L2  vertebra ([place]) 01/12/2015  . Acute osteomyelitis of calcaneum ([place]) 10/09/2014  . Dysuria 10/05/2014  . Cellulitis 10/05/2014  . Fall from ladder 09/21/2014  . L2 vertebral fracture ([place]) 09/21/2014  . Bilateral calcaneal fractures 09/21/2014  . Chronic pain 09/21/2014  . Acute blood loss anemia 09/21/2014  . Open fracture of both calcanei 09/15/2014   Past Medical History:  Diagnosis Date  . Anxiety   . Arthritis    low back pain, lumbar radiculopathy  . Depression   . Fatty liver   . Fracture    B/L ankles  . GERD (gastroesophageal reflux disease)   . Headache(784.0)    allergy related   . History of kidney stones    passed stones  . History of stomach ulcers   . Retained orthopedic hardware    failed retained hardware right foot  . Wears glasses   . Wears glasses   . Wears partial dentures    upper    Family History  Problem Relation Age of Onset  . Diabetes Father   . Cancer Other     Past Surgical History:  Procedure Laterality Date  . AMPUTATION Right 01/29/2020   Procedure: RIGHT BELOW KNEE AMPUTATION;  Surgeon: Newt Minion, MD;  Location: [place];  Service: Orthopedics;  Laterality: Right;  . ANKLE FUSION Right 05/11/2015   Procedure: Right Posterior Arthroscopic Subtalar Arthrodesis;  Surgeon: Newt Minion, MD;  Location: [place];  Service: Orthopedics;  Laterality: Right;  . ANKLE FUSION Right 11/27/2017   Procedure: RIGHT TIBIOCALCANEAL FUSION;  Surgeon: Newt Minion, MD;  Location: [place];  Service: Orthopedics;  Laterality: Right;  . ANKLE FUSION Right 11/26/2018   Procedure: RIGHT ANTERIOR ANKLE FUSION, APPLY VAC;  Surgeon: Newt Minion, MD;  Location: [place];  Service: Orthopedics;  Laterality: Right;  . ANKLE FUSION Right 09/09/2019   Procedure: REVISION RIGHT ANKLE FUSION;  Surgeon: Newt Minion, MD;  Location: [place];  Service: Orthopedics;  Laterality: Right;  . ANTERIOR LAT LUMBAR FUSION N/A 04/05/2017   Procedure: Extreme Lateral Interbody Fusion -  Lumbar three-lumbar four ,exploration of fusion Posterior augmentation with globus addition Removal hardware Lumbar one-three. Lumbar four-sacral one,  Removal internal bone growth stimulator;  Surgeon: Kary Kos, MD;  Location: [place];  Service: Neurosurgery;  Laterality: N/A;  . APPLICATION OF WOUND VAC  12/23/2018   Procedure: Application Of Wound Vac;  Surgeon: Newt Minion, MD;  Location: [place];  Service: Orthopedics;;  . BACK SURGERY  2004   x 2  . BIOPSY  06/05/2019   Procedure: BIOPSY;  Surgeon: Rogene Houston, MD;  [facility];  Service:  Endoscopy;;  gastric biopsy  . CERVICAL SPINE SURGERY  2008  . CHOLECYSTECTOMY N/A 07/06/2019   Procedure: LAPAROSCOPIC CHOLECYSTECTOMY;  Surgeon: Virl Cagey, MD;  [facility];  Service: General;  Laterality: N/A;  . COLONOSCOPY WITH PROPOFOL N/A 06/05/2019   Procedure: COLONOSCOPY WITH PROPOFOL;  Surgeon: Rogene Houston, MD;  [facility];  Service: Endoscopy;  Laterality: N/A;  . ESOPHAGOGASTRODUODENOSCOPY    . ESOPHAGOGASTRODUODENOSCOPY (EGD) WITH PROPOFOL N/A 06/05/2019   Procedure: ESOPHAGOGASTRODUODENOSCOPY (EGD) WITH PROPOFOL;  Surgeon: Rogene Houston, MD;  [facility];  Service: Endoscopy;  Laterality: N/A;  1220pm  . HARDWARE REMOVAL Right 10/09/2014   Procedure: Removal Deep Hardware, Irrigation and Debridement Calcaneus, Place Antibiotic Beads and Wound VAC ;  Surgeon: Newt Minion, MD;  Location: [place];  Service: Orthopedics;  Laterality: Right;  . HARDWARE REMOVAL Right 08/12/2015   Procedure: Removal Deep Hardware Right Foot;  Surgeon: Newt Minion, MD;  Location: [place];  Service: Orthopedics;  Laterality: Right;  . HARDWARE REMOVAL Right 11/26/2018   Procedure: REMOVAL RIGHT TIBIOCALCANEAL NAIL;  Surgeon: Newt Minion, MD;  Location: [place];  Service: Orthopedics;  Laterality: Right;  . HARDWARE REMOVAL Right 12/23/2018   Procedure: RIGHT ANKLE REMOVE HARDWARE, PLACE ANTIBIOTIC BEADS and  placement of wound vac;  Surgeon: Newt Minion, MD;  Location: [place];  Service: Orthopedics;  Laterality: Right;  . HARDWARE REMOVAL Right 09/29/2019   Procedure: RIGHT ANKLE REMOVE HARDWARE, DEBRIDEMENT;  Surgeon: Newt Minion, MD;  Location: [place];  Service: Orthopedics;  Laterality: Right;  . I & D EXTREMITY Right 09/15/2014   Procedure: IRRIGATION AND DEBRIDEMENT Ankle;  Surgeon: Renette Butters, MD;  Location: [place];  Service: Orthopedics;  Laterality: Right;  . INGUINAL HERNIA REPAIR Bilateral   . LAMINECTOMY WITH POSTERIOR LATERAL ARTHRODESIS LEVEL 4 N/A 09/10/2018   Procedure: Posterior Lateral Fusion - Thoracic Eleven-Thoracic Twelve - Thoracic Twelve-Lumbar One - Lumbar One-Lumbar Two - Lumbar Two-Lumbar Three with instrumentaion and PLA;  Surgeon: Kary Kos, MD;  Location: [place];  Service: Neurosurgery;  Laterality: N/A;  Posterior Lateral Fusion - Thoracic Eleven-Thoracic Twelve - Thoracic Twelve-Lumbar One - Lumbar One-Lumbar Two - Lumbar Two-Lumbar Thre  . LIVER BIOPSY N/A 07/06/2019   Procedure: LIVER BIOPSY;  Surgeon: Virl Cagey, MD;  [facility];  Service: General;  Laterality: N/A;  . LUMBAR PERCUTANEOUS PEDICLE SCREW 1 LEVEL N/A 04/05/2017   Procedure: LUMBAR PERCUTANEOUS PEDICLE SCREW LUMBAR THREE-FOUR;  Surgeon: Kary Kos, MD;  Location: [place];  Service: Neurosurgery;  Laterality: N/A;  . ORIF CALCANEOUS FRACTURE Right 09/19/2014   Procedure: OPEN REDUCTION INTERNAL FIXATION (ORIF) CALCANEOUS FRACTURE;  Surgeon: Newt Minion, MD;  Location: [place];  Service: Orthopedics;  Laterality: Right;  . ORIF CALCANEOUS FRACTURE Left 09/19/2014   Procedure: OPEN REDUCTION INTERNAL FIXATION (ORIF) CALCANEOUS FRACTURE;  Surgeon: Newt Minion, MD;  Location: [place];  Service: Orthopedics;  Laterality: Left;  . POLYPECTOMY  06/05/2019   Procedure: POLYPECTOMY;  Surgeon: Rogene Houston, MD;  [facility];  Service: Endoscopy;;  cecal polyp biopsy forcep, ascending  polyp cold snare   Social History   Occupational History    Comment: disabled  Tobacco Use  . Smoking status: Former Smoker    Packs/day: 1.00    Years: 10.00    Pack years: 10.00    Types: Cigars    Start date: 10/08/1976    Quit date: 09/2017  Years since quitting: 2.9  . Smokeless tobacco: Never Used  . Tobacco comment: quit smoking cegar 09/10/2018  Vaping Use  . Vaping Use: Never used  Substance and Sexual Activity  . Alcohol use: No    Comment:  quit 1998  . Drug use: No  . Sexual activity: Yes    Birth control/protection: None

## 2020-08-25 ENCOUNTER — Other Ambulatory Visit (INDEPENDENT_AMBULATORY_CARE_PROVIDER_SITE_OTHER): Payer: Self-pay | Admitting: Gastroenterology

## 2020-08-30 ENCOUNTER — Other Ambulatory Visit (INDEPENDENT_AMBULATORY_CARE_PROVIDER_SITE_OTHER): Payer: Self-pay | Admitting: Gastroenterology

## 2020-08-30 MED ORDER — ONDANSETRON HCL 4 MG PO TABS: 4.0000 mg | ORAL_TABLET | Freq: Three times a day (TID) | ORAL | 2 refills | Status: DC | PRN

## 2020-08-30 NOTE — Progress Notes (Signed)
Refill of Zofran sent to pharmacy per request.

## 2020-09-06 DIAGNOSIS — Z89511 Acquired absence of right leg below knee: Secondary | ICD-10-CM | POA: Diagnosis not present

## 2020-09-06 DIAGNOSIS — Z981 Arthrodesis status: Secondary | ICD-10-CM | POA: Diagnosis not present

## 2020-09-06 DIAGNOSIS — M47816 Spondylosis without myelopathy or radiculopathy, lumbar region: Secondary | ICD-10-CM | POA: Diagnosis not present

## 2020-09-06 DIAGNOSIS — F1120 Opioid dependence, uncomplicated: Secondary | ICD-10-CM | POA: Diagnosis not present

## 2020-10-04 DIAGNOSIS — F5221 Male erectile disorder: Secondary | ICD-10-CM | POA: Diagnosis not present

## 2020-10-04 DIAGNOSIS — J029 Acute pharyngitis, unspecified: Secondary | ICD-10-CM | POA: Diagnosis not present

## 2020-10-04 DIAGNOSIS — M5136 Other intervertebral disc degeneration, lumbar region: Secondary | ICD-10-CM | POA: Diagnosis not present

## 2020-10-04 DIAGNOSIS — E291 Testicular hypofunction: Secondary | ICD-10-CM | POA: Diagnosis not present

## 2020-10-04 DIAGNOSIS — E782 Mixed hyperlipidemia: Secondary | ICD-10-CM | POA: Diagnosis not present

## 2020-10-04 DIAGNOSIS — L0291 Cutaneous abscess, unspecified: Secondary | ICD-10-CM | POA: Diagnosis not present

## 2020-10-04 DIAGNOSIS — Z0000 Encounter for general adult medical examination without abnormal findings: Secondary | ICD-10-CM | POA: Diagnosis not present

## 2020-10-04 DIAGNOSIS — J039 Acute tonsillitis, unspecified: Secondary | ICD-10-CM | POA: Diagnosis not present

## 2020-10-04 DIAGNOSIS — R140 Abdominal distension (gaseous): Secondary | ICD-10-CM | POA: Diagnosis not present

## 2020-10-04 DIAGNOSIS — Z77018 Contact with and (suspected) exposure to other hazardous metals: Secondary | ICD-10-CM | POA: Diagnosis not present

## 2020-10-04 DIAGNOSIS — S6991XA Unspecified injury of right wrist, hand and finger(s), initial encounter: Secondary | ICD-10-CM | POA: Diagnosis not present

## 2020-10-04 DIAGNOSIS — R5383 Other fatigue: Secondary | ICD-10-CM | POA: Diagnosis not present

## 2020-10-11 DIAGNOSIS — N400 Enlarged prostate without lower urinary tract symptoms: Secondary | ICD-10-CM | POA: Diagnosis not present

## 2020-10-11 DIAGNOSIS — K219 Gastro-esophageal reflux disease without esophagitis: Secondary | ICD-10-CM | POA: Diagnosis not present

## 2020-10-11 DIAGNOSIS — F5221 Male erectile disorder: Secondary | ICD-10-CM | POA: Diagnosis not present

## 2020-10-11 DIAGNOSIS — Z89511 Acquired absence of right leg below knee: Secondary | ICD-10-CM | POA: Diagnosis not present

## 2020-10-11 DIAGNOSIS — E785 Hyperlipidemia, unspecified: Secondary | ICD-10-CM | POA: Diagnosis not present

## 2020-10-11 DIAGNOSIS — E291 Testicular hypofunction: Secondary | ICD-10-CM | POA: Diagnosis not present

## 2020-10-11 DIAGNOSIS — K5900 Constipation, unspecified: Secondary | ICD-10-CM | POA: Diagnosis not present

## 2020-10-11 DIAGNOSIS — F331 Major depressive disorder, recurrent, moderate: Secondary | ICD-10-CM | POA: Diagnosis not present

## 2020-10-11 DIAGNOSIS — G894 Chronic pain syndrome: Secondary | ICD-10-CM | POA: Diagnosis not present

## 2020-10-11 DIAGNOSIS — G4700 Insomnia, unspecified: Secondary | ICD-10-CM | POA: Diagnosis not present

## 2020-10-11 DIAGNOSIS — R5383 Other fatigue: Secondary | ICD-10-CM | POA: Diagnosis not present

## 2020-10-13 DIAGNOSIS — M5412 Radiculopathy, cervical region: Secondary | ICD-10-CM | POA: Diagnosis not present

## 2020-11-07 DIAGNOSIS — G4486 Cervicogenic headache: Secondary | ICD-10-CM | POA: Diagnosis not present

## 2020-11-07 DIAGNOSIS — M47812 Spondylosis without myelopathy or radiculopathy, cervical region: Secondary | ICD-10-CM | POA: Diagnosis not present

## 2020-11-07 DIAGNOSIS — Z981 Arthrodesis status: Secondary | ICD-10-CM | POA: Diagnosis not present

## 2020-11-17 ENCOUNTER — Other Ambulatory Visit: Payer: Self-pay | Admitting: Neurosurgery

## 2020-11-17 DIAGNOSIS — S32001A Stable burst fracture of unspecified lumbar vertebra, initial encounter for closed fracture: Secondary | ICD-10-CM

## 2020-11-17 DIAGNOSIS — R030 Elevated blood-pressure reading, without diagnosis of hypertension: Secondary | ICD-10-CM | POA: Diagnosis not present

## 2020-11-17 DIAGNOSIS — M47816 Spondylosis without myelopathy or radiculopathy, lumbar region: Secondary | ICD-10-CM | POA: Diagnosis not present

## 2020-11-21 DIAGNOSIS — M47812 Spondylosis without myelopathy or radiculopathy, cervical region: Secondary | ICD-10-CM | POA: Diagnosis not present

## 2020-12-05 ENCOUNTER — Ambulatory Visit
Admission: RE | Admit: 2020-12-05 | Discharge: 2020-12-05 | Disposition: A | Discharge status: Home / Self Care | Payer: PPO | Source: Ambulatory Visit | Attending: Neurosurgery | Admitting: Neurosurgery

## 2020-12-05 DIAGNOSIS — M48061 Spinal stenosis, lumbar region without neurogenic claudication: Secondary | ICD-10-CM | POA: Diagnosis not present

## 2020-12-05 DIAGNOSIS — M4326 Fusion of spine, lumbar region: Secondary | ICD-10-CM | POA: Diagnosis not present

## 2020-12-05 DIAGNOSIS — S32001A Stable burst fracture of unspecified lumbar vertebra, initial encounter for closed fracture: Secondary | ICD-10-CM

## 2020-12-05 DIAGNOSIS — M4324 Fusion of spine, thoracic region: Secondary | ICD-10-CM | POA: Diagnosis not present

## 2020-12-05 DIAGNOSIS — M5126 Other intervertebral disc displacement, lumbar region: Secondary | ICD-10-CM | POA: Diagnosis not present

## 2020-12-05 DIAGNOSIS — M4316 Spondylolisthesis, lumbar region: Secondary | ICD-10-CM | POA: Diagnosis not present

## 2020-12-05 DIAGNOSIS — Z981 Arthrodesis status: Secondary | ICD-10-CM | POA: Diagnosis not present

## 2020-12-08 DIAGNOSIS — T8484XA Pain due to internal orthopedic prosthetic devices, implants and grafts, initial encounter: Secondary | ICD-10-CM | POA: Diagnosis not present

## 2020-12-12 ENCOUNTER — Other Ambulatory Visit (INDEPENDENT_AMBULATORY_CARE_PROVIDER_SITE_OTHER): Payer: Self-pay | Admitting: Internal Medicine

## 2020-12-15 ENCOUNTER — Ambulatory Visit (INDEPENDENT_AMBULATORY_CARE_PROVIDER_SITE_OTHER): Payer: PPO | Admitting: Gastroenterology

## 2020-12-15 ENCOUNTER — Other Ambulatory Visit: Payer: Self-pay

## 2020-12-15 ENCOUNTER — Encounter (INDEPENDENT_AMBULATORY_CARE_PROVIDER_SITE_OTHER): Payer: Self-pay | Admitting: Gastroenterology

## 2020-12-15 VITALS — BP 155/84 | HR 76 | Temp 98.3°F | Ht 72.0 in | Wt 146.0 lb

## 2020-12-15 DIAGNOSIS — R1032 Left lower quadrant pain: Secondary | ICD-10-CM | POA: Diagnosis not present

## 2020-12-15 DIAGNOSIS — R140 Abdominal distension (gaseous): Secondary | ICD-10-CM

## 2020-12-15 DIAGNOSIS — F1190 Opioid use, unspecified, uncomplicated: Secondary | ICD-10-CM | POA: Diagnosis not present

## 2020-12-15 DIAGNOSIS — R634 Abnormal weight loss: Secondary | ICD-10-CM | POA: Diagnosis not present

## 2020-12-15 MED ORDER — MIRTAZAPINE 7.5 MG PO TABS: 7.5000 mg | ORAL_TABLET | Freq: Every day | ORAL | 1 refills | Status: DC

## 2020-12-15 MED ORDER — DICYCLOMINE HCL 10 MG PO CAPS: 10.0000 mg | ORAL_CAPSULE | Freq: Two times a day (BID) | ORAL | 2 refills | Status: DC | PRN

## 2020-12-15 NOTE — Patient Instructions (Addendum)
Schedule abdominal US Start mirtazapine 7.5 mg bedtime Start Bentyl 1 tablet q12h as needed for abdominal pain Stop Nexium

## 2020-12-15 NOTE — Progress Notes (Signed)
[name], M.D. Gastroenterology & Hepatology [facility] [address] [place], [place] [address]  Primary Care Physician: Celene Squibb, MD [place] [id number]  I will communicate my assessment and recommendations to the referring MD via EMR.  Problems: 1. Left abdominal pain 2. Chronic opiate intake  History of Present Illness: Bradley Espinoza is a 65 y.o. male with past medical history of anxiety, depression, GERD, chronic opiate use due to vertebral fracture, who presents for evaluation of left abdominal pain.  The patient was last seen on 06/16/2020. At that time, the patient was recommended to take Reglan 5 mg 3-4 times per day for management of opiate induced gastroparesis.  Also was advised to take MiraLAX for management of constipation.  Patient reports he has presented LUQ pain 2 weeks ago, which fluctuates in intensity intermittently, worse when he lays on the left side of his abdomen. He reports that the pain is present during the day and is significant in intensity.  He also reports having chronic nausea and poor oral intake.  He states all the symptoms became worse after he had a cholecystectomy 1 year ago for which he has presented decreased oral intake and had some abdominal discomfort.  He also reports that his appetite has decreased to the point he has lost 40 pounds since his surgery.  He tries to maintain his weight by drinking Boost or Ensure shakes daily. Has to take Zofran 4 mg 3-4 times a day.  He is having a bowel movement every day at least, can be 2-3 bowel movements per day if he takes a laxative.  Notably, for management of his GERD, patient reports that he did not tolerate Nexium and had to go back to Prilosec, he reported that Nexium was causing significant burning sensation in his abdomen.  The patient denies having any fever, chills, hematochezia, melena, hematemesis, abdominal  distention,  diarrhea, jaundice, pruritus.  Most recent CT of the abdomen pelvis with IV contrast was performed on 04/19/2020 which showed large stool burden but no other alterations.  Last EGD: 06/05/2019, normal esophagus, had gastritis with biopsies negative for H. pylori, normal duodenum.  Last Colonoscopy: 06/05/2019 small polyp was found in the cecum which was resected, 2 small polyps were found in the distal sigmoid and ascending colon which were resected, diverticulosis and external hemorrhoids.  On polyps were tubular adenomas  Past Medical History: Past Medical History:  Diagnosis Date  . Anxiety   . Arthritis    low back pain, lumbar radiculopathy  . Depression   . Fatty liver   . Fracture    B/L ankles  . GERD (gastroesophageal reflux disease)   . Headache(784.0)    allergy related   . History of kidney stones    passed stones  . History of stomach ulcers   . Retained orthopedic hardware    failed retained hardware right foot  . Wears glasses   . Wears glasses   . Wears partial dentures    upper    Past Surgical History: Past Surgical History:  Procedure Laterality Date  . AMPUTATION Right 01/29/2020   Procedure: RIGHT BELOW KNEE AMPUTATION;  Surgeon: Newt Minion, MD;  Location: [place];  Service: Orthopedics;  Laterality: Right;  . ANKLE FUSION Right 05/11/2015   Procedure: Right Posterior Arthroscopic Subtalar Arthrodesis;  Surgeon: Newt Minion, MD;  Location: [place];  Service: Orthopedics;  Laterality: Right;  . ANKLE FUSION  Right 11/27/2017   Procedure: RIGHT TIBIOCALCANEAL FUSION;  Surgeon: Newt Minion, MD;  Location: [place];  Service: Orthopedics;  Laterality: Right;  . ANKLE FUSION Right 11/26/2018   Procedure: RIGHT ANTERIOR ANKLE FUSION, APPLY VAC;  Surgeon: Newt Minion, MD;  Location: [place];  Service: Orthopedics;  Laterality: Right;  . ANKLE FUSION Right 09/09/2019   Procedure: REVISION RIGHT ANKLE FUSION;  Surgeon: Newt Minion, MD;  Location: [place];  Service: Orthopedics;  Laterality: Right;  . ANTERIOR LAT LUMBAR FUSION N/A 04/05/2017   Procedure: Extreme Lateral Interbody Fusion - Lumbar three-lumbar four ,exploration of fusion Posterior augmentation with globus addition Removal hardware Lumbar one-three. Lumbar four-sacral one,  Removal internal bone growth stimulator;  Surgeon: Kary Kos, MD;  Location: [place];  Service: Neurosurgery;  Laterality: N/A;  . APPLICATION OF WOUND VAC  12/23/2018   Procedure: Application Of Wound Vac;  Surgeon: Newt Minion, MD;  Location: [place];  Service: Orthopedics;;  . BACK SURGERY  2004   x 2  . BIOPSY  06/05/2019   Procedure: BIOPSY;  Surgeon: Rogene Houston, MD;  [facility];  Service: Endoscopy;;  gastric biopsy  . CERVICAL SPINE SURGERY  2008  . CHOLECYSTECTOMY N/A 07/06/2019   Procedure: LAPAROSCOPIC CHOLECYSTECTOMY;  Surgeon: Virl Cagey, MD;  [facility];  Service: General;  Laterality: N/A;  . COLONOSCOPY WITH PROPOFOL N/A 06/05/2019   Procedure: COLONOSCOPY WITH PROPOFOL;  Surgeon: Rogene Houston, MD;  [facility];  Service: Endoscopy;  Laterality: N/A;  . ESOPHAGOGASTRODUODENOSCOPY    . ESOPHAGOGASTRODUODENOSCOPY (EGD) WITH PROPOFOL N/A 06/05/2019   Procedure: ESOPHAGOGASTRODUODENOSCOPY (EGD) WITH PROPOFOL;  Surgeon: Rogene Houston, MD;  [facility];  Service: Endoscopy;  Laterality: N/A;  1220pm  . HARDWARE REMOVAL Right 10/09/2014   Procedure: Removal Deep Hardware, Irrigation and Debridement Calcaneus, Place Antibiotic Beads and Wound VAC ;  Surgeon: Newt Minion, MD;  Location: [place];  Service: Orthopedics;  Laterality: Right;  . HARDWARE REMOVAL Right 08/12/2015   Procedure: Removal Deep Hardware Right Foot;  Surgeon: Newt Minion, MD;  Location: [place];  Service: Orthopedics;  Laterality: Right;  . HARDWARE REMOVAL Right 11/26/2018   Procedure: REMOVAL RIGHT TIBIOCALCANEAL NAIL;  Surgeon: Newt Minion, MD;  Location: [place];  Service:  Orthopedics;  Laterality: Right;  . HARDWARE REMOVAL Right 12/23/2018   Procedure: RIGHT ANKLE REMOVE HARDWARE, PLACE ANTIBIOTIC BEADS and placement of wound vac;  Surgeon: Newt Minion, MD;  Location: [place];  Service: Orthopedics;  Laterality: Right;  . HARDWARE REMOVAL Right 09/29/2019   Procedure: RIGHT ANKLE REMOVE HARDWARE, DEBRIDEMENT;  Surgeon: Newt Minion, MD;  Location: [place];  Service: Orthopedics;  Laterality: Right;  . I & D EXTREMITY Right 09/15/2014   Procedure: IRRIGATION AND DEBRIDEMENT Ankle;  Surgeon: Renette Butters, MD;  Location: [place];  Service: Orthopedics;  Laterality: Right;  . INGUINAL HERNIA REPAIR Bilateral   . LAMINECTOMY WITH POSTERIOR LATERAL ARTHRODESIS LEVEL 4 N/A 09/10/2018   Procedure: Posterior Lateral Fusion - Thoracic Eleven-Thoracic Twelve - Thoracic Twelve-Lumbar One - Lumbar One-Lumbar Two - Lumbar Two-Lumbar Three with instrumentaion and PLA;  Surgeon: Kary Kos, MD;  Location: [place];  Service: Neurosurgery;  Laterality: N/A;  Posterior Lateral Fusion - Thoracic Eleven-Thoracic Twelve - Thoracic Twelve-Lumbar One - Lumbar One-Lumbar Two - Lumbar Two-Lumbar Thre  . LIVER BIOPSY N/A 07/06/2019   Procedure: LIVER BIOPSY;  Surgeon: Virl Cagey, MD;  [facility];  Service: General;  Laterality: N/A;  . LUMBAR PERCUTANEOUS PEDICLE SCREW 1 LEVEL N/A 04/05/2017   Procedure: LUMBAR PERCUTANEOUS PEDICLE SCREW LUMBAR THREE-FOUR;  Surgeon: Kary Kos, MD;  Location: [place];  Service: Neurosurgery;  Laterality: N/A;  . ORIF CALCANEOUS FRACTURE Right 09/19/2014   Procedure: OPEN REDUCTION INTERNAL FIXATION (ORIF) CALCANEOUS FRACTURE;  Surgeon: Newt Minion, MD;  Location: [place];  Service: Orthopedics;  Laterality: Right;  . ORIF CALCANEOUS FRACTURE Left 09/19/2014   Procedure: OPEN REDUCTION INTERNAL FIXATION (ORIF) CALCANEOUS FRACTURE;  Surgeon: Newt Minion, MD;  Location: [place];  Service: Orthopedics;  Laterality: Left;  . POLYPECTOMY  06/05/2019    Procedure: POLYPECTOMY;  Surgeon: Rogene Houston, MD;  [facility];  Service: Endoscopy;;  cecal polyp biopsy forcep, ascending polyp cold snare    Family History: Family History  Problem Relation Age of Onset  . Diabetes Father   . Cancer Other     Social History: Social History   Tobacco Use  Smoking Status Former Smoker  . Packs/day: 1.00  . Years: 10.00  . Pack years: 10.00  . Types: Cigars  . Start date: 10/08/1976  . Quit date: 09/2017  . Years since quitting: 3.2  Smokeless Tobacco Never Used  Tobacco Comment   quit smoking cegar 09/10/2018   Social History   Substance and Sexual Activity  Alcohol Use No   Comment:  quit 1998   Social History   Substance and Sexual Activity  Drug Use No    Allergies: Allergies  Allergen Reactions  . Doxycycline Nausea Only  . Levofloxacin Other (See Comments)    Blister in mouth  . Codeine Nausea And Vomiting    Medications: Current Outpatient Medications  Medication Sig Dispense Refill  . albuterol (VENTOLIN HFA) 108 (90 Base) MCG/ACT inhaler Inhale 2 puffs into the lungs every 4 (four) hours as needed.    . dicyclomine (BENTYL) 10 MG capsule Take 1 capsule (10 mg total) by mouth every 12 (twelve) hours as needed for spasms. 60 capsule 2  . docusate sodium (COLACE) 100 MG capsule Take 100 mg by mouth daily as needed for mild constipation.     . gabapentin (NEURONTIN) 300 MG capsule Take 300 mg by mouth at bedtime.    . metoCLOPramide (REGLAN) 5 MG tablet Take 1 tablet (5 mg total) by mouth 4 (four) times daily -  before meals and at bedtime. 360 tablet 2  . mirtazapine (REMERON) 7.5 MG tablet Take 1 tablet (7.5 mg total) by mouth at bedtime. 90 tablet 1  . omeprazole (PRILOSEC) 40 MG capsule TAKE 1 CAPSULE BY MOUTH ONCE DAILY. 30 capsule 5  . ondansetron (ZOFRAN) 4 MG tablet Take 1 tablet (4 mg total) by mouth every 8 (eight) hours as needed for nausea or vomiting. 30 tablet 2  . oxyCODONE-acetaminophen  (PERCOCET) 10-325 MG tablet Take 1 tablet by mouth every 4 (four) hours as needed for pain. 30 tablet 0  . tamsulosin (FLOMAX) 0.4 MG CAPS capsule Take 0.8 mg by mouth daily.    Marland Kitchen testosterone cypionate (DEPOTESTOSTERONE CYPIONATE) 200 MG/ML injection Inject 200 mg into the muscle every 14 (fourteen) days.    Marland Kitchen esomeprazole (NEXIUM) 40 MG capsule Take 1 capsule (40 mg total) by mouth daily before breakfast. (Patient not taking: Reported on 12/15/2020) 30 capsule 3  . pregabalin (LYRICA) 50 MG capsule Take 1 capsule (50 mg total) by mouth 3 (three) times daily. (Patient not taking: Reported on 12/15/2020)  90 capsule 2  . QUETIAPINE FUMARATE PO Take 50 mg by mouth. Patient states that he takes 1/2 tablet at night as needed.  (Patient not taking: Reported on 12/15/2020)     No current facility-administered medications for this visit.   Facility-Administered Medications Ordered in Other Visits  Medication Dose Route Frequency Provider Last Rate Last Admin  . 0.45 % sodium chloride infusion   Intravenous Continuous Rayburn, Neta Mends, PA-C      . docusate sodium (COLACE) capsule 100 mg  100 mg Oral BID Rayburn, Neta Mends, PA-C      . oxyCODONE (Oxy IR/ROXICODONE) immediate release tablet 5-10 mg  5-10 mg Oral Q3H PRN Rayburn, Neta Mends, PA-C      . senna (SENOKOT) tablet 8.6 mg  1 tablet Oral BID Rayburn, Shawn Montgomery, PA-C      . sorbitol 70 % solution 30 mL  30 mL Oral Daily PRN Rayburn, Neta Mends, PA-C        Review of Systems: GENERAL: negative for malaise, night sweats HEENT: No changes in hearing or vision, no nose bleeds or other nasal problems. NECK: Negative for lumps, goiter, pain and significant neck swelling RESPIRATORY: Negative for cough, wheezing CARDIOVASCULAR: Negative for chest pain, leg swelling, palpitations, orthopnea GI: SEE HPI MUSCULOSKELETAL: Negative for joint pain or swelling, back pain, and muscle pain. SKIN: Negative for lesions,  rash PSYCH: Negative for sleep disturbance, mood disorder and recent psychosocial stressors. HEMATOLOGY Negative for prolonged bleeding, bruising easily, and swollen nodes. ENDOCRINE: Negative for cold or heat intolerance, polyuria, polydipsia and goiter. NEURO: negative for tremor, gait imbalance, syncope and seizures. The remainder of the review of systems is noncontributory.   Physical Exam: BP (!) 155/84 (BP Location: Left Arm, Patient Position: Sitting, Cuff Size: Large)   Pulse 76   Temp 98.3 F (36.8 C) (Oral)   Ht 6' (1.829 m)   Wt 146 lb (66.2 kg)   BMI 19.80 kg/m  GENERAL: The patient is AO x3, in no acute distress. HEENT: Head is normocephalic and atraumatic. EOMI are intact. Mouth is well hydrated and without lesions. NECK: Supple. No masses LUNGS: Clear to auscultation. No presence of rhonchi/wheezing/rales. Adequate chest expansion HEART: RRR, normal s1 and s2. ABDOMEN: Soft, nontender, no guarding, no peritoneal signs, and nondistended. BS +. No masses. EXTREMITIES: Without any cyanosis, clubbing, rash, lesions or edema. NEUROLOGIC: AOx3, no focal motor deficit. SKIN: no jaundice, no rashes  Imaging/Labs: as above  I personally reviewed and interpreted the available labs, imaging and endoscopic files.  Impression and Plan: Bradley Espinoza is a 65 y.o. male with past medical history of anxiety, depression, GERD, chronic opiate use due to vertebral fracture, who presents for evaluation of left abdominal pain.  Patient has also presented significant weight loss and poor appetite for at least 1 year.  He has undergone relatively recent endoscopic investigations and imaging that have been negative for any organic etiologies explain his symptoms.  However he has been chronically taking opiates which have led to proven gastroparesis due to medication.  I consider that most of the symptoms he is presenting are related to bowel hypersensitivity due to chronic opiate intake.   We will perform an abdominal ultrasound to evaluate possible organically causing mucosal of pain in his left side of the abdomen.  However explained to the patient that the main culprit of his symptoms is likely related to intake of opiates which he should decrease as much as possible.  As he has presented significant  symptoms concerning for significant weight loss due to poor appetite, patient will be started on Remeron 7.5 mg every day which also will help for his hypersensitivity.  Also, he can take antispasmodic such as Bentyl for breakthrough symptoms.  Patient understood and agreed.  - Schedule abdominal US - Start mirtazapine 7.5 mg bedtime - Start Bentyl 1 tablet q12h as needed for abdominal pain  All questions were answered.      Harvel Quale, MD Gastroenterology and Hepatology [facility]

## 2020-12-20 DIAGNOSIS — M47812 Spondylosis without myelopathy or radiculopathy, cervical region: Secondary | ICD-10-CM | POA: Diagnosis not present

## 2020-12-20 DIAGNOSIS — G4486 Cervicogenic headache: Secondary | ICD-10-CM | POA: Diagnosis not present

## 2020-12-20 DIAGNOSIS — M961 Postlaminectomy syndrome, not elsewhere classified: Secondary | ICD-10-CM | POA: Diagnosis not present

## 2020-12-22 ENCOUNTER — Ambulatory Visit (HOSPITAL_COMMUNITY): Payer: PPO

## 2020-12-26 ENCOUNTER — Other Ambulatory Visit (INDEPENDENT_AMBULATORY_CARE_PROVIDER_SITE_OTHER): Payer: Self-pay | Admitting: Gastroenterology

## 2020-12-27 ENCOUNTER — Other Ambulatory Visit (INDEPENDENT_AMBULATORY_CARE_PROVIDER_SITE_OTHER): Payer: Self-pay | Admitting: Gastroenterology

## 2021-01-02 ENCOUNTER — Other Ambulatory Visit: Payer: Self-pay

## 2021-01-02 MED ORDER — TESTOSTERONE CYPIONATE 200 MG/ML IM SOLN: 200.0000 mg | INTRAMUSCULAR | 3 refills | Status: AC

## 2021-01-06 ENCOUNTER — Other Ambulatory Visit: Payer: Self-pay | Admitting: Neurosurgery

## 2021-01-06 ENCOUNTER — Other Ambulatory Visit (HOSPITAL_COMMUNITY)
Admission: RE | Admit: 2021-01-06 | Discharge: 2021-01-06 | Disposition: A | Discharge status: Home / Self Care | Payer: PPO | Source: Ambulatory Visit | Attending: Neurosurgery | Admitting: Neurosurgery

## 2021-01-06 DIAGNOSIS — Y831 Surgical operation with implant of artificial internal device as the cause of abnormal reaction of the patient, or of later complication, without mention of misadventure at the time of the procedure: Secondary | ICD-10-CM | POA: Diagnosis present

## 2021-01-06 DIAGNOSIS — Z79899 Other long term (current) drug therapy: Secondary | ICD-10-CM | POA: Diagnosis not present

## 2021-01-06 DIAGNOSIS — F419 Anxiety disorder, unspecified: Secondary | ICD-10-CM | POA: Diagnosis present

## 2021-01-06 DIAGNOSIS — K219 Gastro-esophageal reflux disease without esophagitis: Secondary | ICD-10-CM | POA: Diagnosis present

## 2021-01-06 DIAGNOSIS — Z881 Allergy status to other antibiotic agents status: Secondary | ICD-10-CM | POA: Diagnosis not present

## 2021-01-06 DIAGNOSIS — Z8711 Personal history of peptic ulcer disease: Secondary | ICD-10-CM | POA: Diagnosis not present

## 2021-01-06 DIAGNOSIS — Z833 Family history of diabetes mellitus: Secondary | ICD-10-CM | POA: Diagnosis not present

## 2021-01-06 DIAGNOSIS — Z9049 Acquired absence of other specified parts of digestive tract: Secondary | ICD-10-CM | POA: Diagnosis not present

## 2021-01-06 DIAGNOSIS — Z87442 Personal history of urinary calculi: Secondary | ICD-10-CM | POA: Diagnosis not present

## 2021-01-06 DIAGNOSIS — F418 Other specified anxiety disorders: Secondary | ICD-10-CM | POA: Diagnosis not present

## 2021-01-06 DIAGNOSIS — M545 Low back pain, unspecified: Secondary | ICD-10-CM | POA: Diagnosis present

## 2021-01-06 DIAGNOSIS — Z01812 Encounter for preprocedural laboratory examination: Secondary | ICD-10-CM | POA: Insufficient documentation

## 2021-01-06 DIAGNOSIS — Z89511 Acquired absence of right leg below knee: Secondary | ICD-10-CM | POA: Diagnosis not present

## 2021-01-06 DIAGNOSIS — Z20822 Contact with and (suspected) exposure to covid-19: Secondary | ICD-10-CM | POA: Diagnosis present

## 2021-01-06 DIAGNOSIS — G8929 Other chronic pain: Secondary | ICD-10-CM | POA: Diagnosis present

## 2021-01-06 DIAGNOSIS — J449 Chronic obstructive pulmonary disease, unspecified: Secondary | ICD-10-CM | POA: Diagnosis present

## 2021-01-06 DIAGNOSIS — Z87891 Personal history of nicotine dependence: Secondary | ICD-10-CM | POA: Diagnosis not present

## 2021-01-06 DIAGNOSIS — Z981 Arthrodesis status: Secondary | ICD-10-CM | POA: Diagnosis not present

## 2021-01-06 DIAGNOSIS — Z91011 Allergy to milk products: Secondary | ICD-10-CM | POA: Diagnosis not present

## 2021-01-06 DIAGNOSIS — M1990 Unspecified osteoarthritis, unspecified site: Secondary | ICD-10-CM | POA: Diagnosis present

## 2021-01-06 DIAGNOSIS — T8484XA Pain due to internal orthopedic prosthetic devices, implants and grafts, initial encounter: Secondary | ICD-10-CM | POA: Diagnosis present

## 2021-01-06 DIAGNOSIS — D62 Acute posthemorrhagic anemia: Secondary | ICD-10-CM | POA: Diagnosis not present

## 2021-01-06 DIAGNOSIS — K760 Fatty (change of) liver, not elsewhere classified: Secondary | ICD-10-CM | POA: Diagnosis present

## 2021-01-06 NOTE — Progress Notes (Signed)
Bradley Espinoza denies chest pain or shorteness of breath. Patient denies any s/s of Covid and has not been in contact with anyone with Covid to his knowledge.  Bradley Espinoza will be tested today for Covid and will be in quarantine until  after surgery.

## 2021-01-07 LAB — SARS CORONAVIRUS 2 (TAT 6-24 HRS): SARS Coronavirus 2: NEGATIVE

## 2021-01-09 ENCOUNTER — Inpatient Hospital Stay (HOSPITAL_COMMUNITY)
Admission: RE | Admit: 2021-01-09 | Discharge: 2021-01-09 | DRG: 497 | Disposition: A | Discharge status: Home / Self Care | Payer: PPO | Attending: Neurosurgery | Admitting: Neurosurgery

## 2021-01-09 ENCOUNTER — Encounter (HOSPITAL_COMMUNITY)
Admission: RE | Disposition: A | Discharge status: Home / Self Care | Payer: Self-pay | Source: Home / Self Care | Attending: Neurosurgery

## 2021-01-09 ENCOUNTER — Inpatient Hospital Stay (HOSPITAL_COMMUNITY): Payer: PPO | Admitting: Anesthesiology

## 2021-01-09 ENCOUNTER — Encounter (HOSPITAL_COMMUNITY): Payer: Self-pay | Admitting: Neurosurgery

## 2021-01-09 ENCOUNTER — Other Ambulatory Visit: Payer: Self-pay

## 2021-01-09 DIAGNOSIS — Z91011 Allergy to milk products: Secondary | ICD-10-CM

## 2021-01-09 DIAGNOSIS — Z881 Allergy status to other antibiotic agents status: Secondary | ICD-10-CM

## 2021-01-09 DIAGNOSIS — Z20822 Contact with and (suspected) exposure to covid-19: Secondary | ICD-10-CM | POA: Diagnosis present

## 2021-01-09 DIAGNOSIS — Z9049 Acquired absence of other specified parts of digestive tract: Secondary | ICD-10-CM

## 2021-01-09 DIAGNOSIS — Z79899 Other long term (current) drug therapy: Secondary | ICD-10-CM

## 2021-01-09 DIAGNOSIS — Z8711 Personal history of peptic ulcer disease: Secondary | ICD-10-CM

## 2021-01-09 DIAGNOSIS — K760 Fatty (change of) liver, not elsewhere classified: Secondary | ICD-10-CM | POA: Diagnosis present

## 2021-01-09 DIAGNOSIS — M1990 Unspecified osteoarthritis, unspecified site: Secondary | ICD-10-CM | POA: Diagnosis present

## 2021-01-09 DIAGNOSIS — T8484XA Pain due to internal orthopedic prosthetic devices, implants and grafts, initial encounter: Secondary | ICD-10-CM | POA: Diagnosis not present

## 2021-01-09 DIAGNOSIS — Z87891 Personal history of nicotine dependence: Secondary | ICD-10-CM

## 2021-01-09 DIAGNOSIS — K219 Gastro-esophageal reflux disease without esophagitis: Secondary | ICD-10-CM | POA: Diagnosis present

## 2021-01-09 DIAGNOSIS — M545 Low back pain, unspecified: Secondary | ICD-10-CM | POA: Diagnosis present

## 2021-01-09 DIAGNOSIS — Z981 Arthrodesis status: Secondary | ICD-10-CM | POA: Diagnosis not present

## 2021-01-09 DIAGNOSIS — Z89511 Acquired absence of right leg below knee: Secondary | ICD-10-CM

## 2021-01-09 DIAGNOSIS — F419 Anxiety disorder, unspecified: Secondary | ICD-10-CM | POA: Diagnosis present

## 2021-01-09 DIAGNOSIS — J449 Chronic obstructive pulmonary disease, unspecified: Secondary | ICD-10-CM | POA: Diagnosis present

## 2021-01-09 DIAGNOSIS — Z833 Family history of diabetes mellitus: Secondary | ICD-10-CM | POA: Diagnosis not present

## 2021-01-09 DIAGNOSIS — G8929 Other chronic pain: Secondary | ICD-10-CM | POA: Diagnosis present

## 2021-01-09 DIAGNOSIS — Z87442 Personal history of urinary calculi: Secondary | ICD-10-CM

## 2021-01-09 DIAGNOSIS — Y831 Surgical operation with implant of artificial internal device as the cause of abnormal reaction of the patient, or of later complication, without mention of misadventure at the time of the procedure: Secondary | ICD-10-CM | POA: Diagnosis present

## 2021-01-09 HISTORY — PX: HARDWARE REMOVAL: SHX979

## 2021-01-09 LAB — CBC
HCT: 49.1 % (ref 39.0–52.0)
Hemoglobin: 15.9 g/dL (ref 13.0–17.0)
MCH: 29.2 pg (ref 26.0–34.0)
MCHC: 32.4 g/dL (ref 30.0–36.0)
MCV: 90.3 fL (ref 80.0–100.0)
Platelets: 206 10*3/uL (ref 150–400)
RBC: 5.44 MIL/uL (ref 4.22–5.81)
RDW: 16 % — ABNORMAL HIGH (ref 11.5–15.5)
WBC: 7.6 10*3/uL (ref 4.0–10.5)
nRBC: 0 % (ref 0.0–0.2)

## 2021-01-09 SURGERY — REMOVAL, HARDWARE
Anesthesia: General | Site: Back

## 2021-01-09 MED ORDER — 0.9 % SODIUM CHLORIDE (POUR BTL) OPTIME
TOPICAL | Status: DC | PRN
  Administered 2021-01-09: 1000 mL

## 2021-01-09 MED ORDER — CHLORHEXIDINE GLUCONATE CLOTH 2 % EX PADS: 6.0000 | MEDICATED_PAD | Freq: Once | CUTANEOUS | Status: DC

## 2021-01-09 MED ORDER — DEXAMETHASONE SODIUM PHOSPHATE 10 MG/ML IJ SOLN
10.0000 mg | Freq: Once | INTRAMUSCULAR | Status: AC
  Administered 2021-01-09: 10 mg via INTRAVENOUS

## 2021-01-09 MED ORDER — CEFAZOLIN SODIUM-DEXTROSE 2-4 GM/100ML-% IV SOLN
2.0000 g | INTRAVENOUS | Status: AC
  Administered 2021-01-09: 2 g via INTRAVENOUS
  Filled 2021-01-09: qty 100

## 2021-01-09 MED ORDER — CHLORHEXIDINE GLUCONATE 0.12 % MT SOLN
15.0000 mL | Freq: Once | OROMUCOSAL | Status: AC
  Administered 2021-01-09: 15 mL via OROMUCOSAL
  Filled 2021-01-09: qty 15

## 2021-01-09 MED ORDER — HYDROMORPHONE HCL 1 MG/ML IJ SOLN
INTRAMUSCULAR | Status: AC
  Filled 2021-01-09: qty 1

## 2021-01-09 MED ORDER — DEXMEDETOMIDINE HCL 200 MCG/2ML IV SOLN
INTRAVENOUS | Status: DC | PRN
  Administered 2021-01-09: 4 ug via INTRAVENOUS
  Administered 2021-01-09: 8 ug via INTRAVENOUS
  Administered 2021-01-09: 4 ug via INTRAVENOUS

## 2021-01-09 MED ORDER — FENTANYL CITRATE (PF) 250 MCG/5ML IJ SOLN
INTRAMUSCULAR | Status: AC
  Filled 2021-01-09: qty 5

## 2021-01-09 MED ORDER — BUPIVACAINE HCL (PF) 0.25 % IJ SOLN
INTRAMUSCULAR | Status: AC
  Filled 2021-01-09: qty 30

## 2021-01-09 MED ORDER — OXYCODONE HCL 5 MG PO TABS
ORAL_TABLET | ORAL | Status: AC
  Filled 2021-01-09: qty 1

## 2021-01-09 MED ORDER — PROPOFOL 10 MG/ML IV BOLUS
INTRAVENOUS | Status: AC
  Filled 2021-01-09: qty 20

## 2021-01-09 MED ORDER — ORAL CARE MOUTH RINSE: 15.0000 mL | Freq: Once | OROMUCOSAL | Status: AC

## 2021-01-09 MED ORDER — OXYCODONE HCL 5 MG PO TABS
5.0000 mg | ORAL_TABLET | Freq: Once | ORAL | Status: AC | PRN
Start: 2021-01-09 — End: 2021-01-09
  Administered 2021-01-09: 5 mg via ORAL

## 2021-01-09 MED ORDER — ROCURONIUM BROMIDE 10 MG/ML (PF) SYRINGE
PREFILLED_SYRINGE | INTRAVENOUS | Status: DC | PRN
  Administered 2021-01-09: 70 mg via INTRAVENOUS
  Administered 2021-01-09: 20 mg via INTRAVENOUS

## 2021-01-09 MED ORDER — MIDAZOLAM HCL 2 MG/2ML IJ SOLN
INTRAMUSCULAR | Status: AC
  Filled 2021-01-09: qty 2

## 2021-01-09 MED ORDER — OXYCODONE HCL 5 MG/5ML PO SOLN: 5.0000 mg | Freq: Once | ORAL | Status: AC | PRN

## 2021-01-09 MED ORDER — PROMETHAZINE HCL 25 MG/ML IJ SOLN: 6.2500 mg | INTRAMUSCULAR | Status: DC | PRN

## 2021-01-09 MED ORDER — THROMBIN 20000 UNITS EX KIT
PACK | CUTANEOUS | Status: AC
  Filled 2021-01-09: qty 1

## 2021-01-09 MED ORDER — MIDAZOLAM HCL 2 MG/2ML IJ SOLN
0.5000 mg | Freq: Once | INTRAMUSCULAR | Status: DC | PRN
Start: 2021-01-09 — End: 2021-01-09

## 2021-01-09 MED ORDER — MEPERIDINE HCL 25 MG/ML IJ SOLN: 6.2500 mg | INTRAMUSCULAR | Status: DC | PRN

## 2021-01-09 MED ORDER — FENTANYL CITRATE (PF) 100 MCG/2ML IJ SOLN
INTRAMUSCULAR | Status: DC | PRN
  Administered 2021-01-09: 200 ug via INTRAVENOUS
  Administered 2021-01-09: 50 ug via INTRAVENOUS

## 2021-01-09 MED ORDER — LIDOCAINE 2% (20 MG/ML) 5 ML SYRINGE
INTRAMUSCULAR | Status: DC | PRN
  Administered 2021-01-09: 20 mg via INTRAVENOUS

## 2021-01-09 MED ORDER — DEXAMETHASONE SODIUM PHOSPHATE 10 MG/ML IJ SOLN
INTRAMUSCULAR | Status: AC
  Filled 2021-01-09: qty 1

## 2021-01-09 MED ORDER — SODIUM CHLORIDE 0.9 % IV SOLN: 250.0000 mL | INTRAVENOUS | Status: DC

## 2021-01-09 MED ORDER — ONDANSETRON HCL 4 MG/2ML IJ SOLN
INTRAMUSCULAR | Status: AC
  Filled 2021-01-09: qty 2

## 2021-01-09 MED ORDER — SUGAMMADEX SODIUM 200 MG/2ML IV SOLN
INTRAVENOUS | Status: DC | PRN
  Administered 2021-01-09: 300 mg via INTRAVENOUS

## 2021-01-09 MED ORDER — PROPOFOL 10 MG/ML IV BOLUS
INTRAVENOUS | Status: DC | PRN
  Administered 2021-01-09: 160 mg via INTRAVENOUS

## 2021-01-09 MED ORDER — BUPIVACAINE HCL (PF) 0.25 % IJ SOLN
INTRAMUSCULAR | Status: DC | PRN
  Administered 2021-01-09: 8 mL

## 2021-01-09 MED ORDER — LIDOCAINE-EPINEPHRINE 1 %-1:100000 IJ SOLN
INTRAMUSCULAR | Status: AC
  Filled 2021-01-09: qty 1

## 2021-01-09 MED ORDER — PHENYLEPHRINE HCL (PRESSORS) 10 MG/ML IV SOLN
INTRAVENOUS | Status: AC
  Filled 2021-01-09: qty 1

## 2021-01-09 MED ORDER — HYDROMORPHONE HCL 1 MG/ML IJ SOLN
0.2500 mg | INTRAMUSCULAR | Status: DC | PRN
  Administered 2021-01-09 (×3): 0.5 mg via INTRAVENOUS

## 2021-01-09 MED ORDER — LIDOCAINE-EPINEPHRINE 1 %-1:100000 IJ SOLN
INTRAMUSCULAR | Status: DC | PRN
  Administered 2021-01-09: 8 mL

## 2021-01-09 MED ORDER — MIDAZOLAM HCL 5 MG/5ML IJ SOLN
INTRAMUSCULAR | Status: DC | PRN
  Administered 2021-01-09: 2 mg via INTRAVENOUS

## 2021-01-09 MED ORDER — SUGAMMADEX SODIUM 500 MG/5ML IV SOLN
INTRAVENOUS | Status: AC
  Filled 2021-01-09: qty 5

## 2021-01-09 MED ORDER — DEXMEDETOMIDINE (PRECEDEX) IN NS 20 MCG/5ML (4 MCG/ML) IV SYRINGE
PREFILLED_SYRINGE | INTRAVENOUS | Status: AC
  Filled 2021-01-09: qty 5

## 2021-01-09 MED ORDER — ONDANSETRON HCL 4 MG/2ML IJ SOLN
INTRAMUSCULAR | Status: DC | PRN
  Administered 2021-01-09: 4 mg via INTRAVENOUS

## 2021-01-09 MED ORDER — LACTATED RINGERS IV SOLN: INTRAVENOUS | Status: DC

## 2021-01-09 MED ORDER — PHENYLEPHRINE 40 MCG/ML (10ML) SYRINGE FOR IV PUSH (FOR BLOOD PRESSURE SUPPORT)
PREFILLED_SYRINGE | INTRAVENOUS | Status: AC
  Filled 2021-01-09: qty 10

## 2021-01-09 SURGICAL SUPPLY — 46 items
BENZOIN TINCTURE PRP APPL 2/3 (GAUZE/BANDAGES/DRESSINGS) ×2 IMPLANT
BLADE CLIPPER SURG (BLADE) IMPLANT
BUR MATCHSTICK NEURO 3.0 LAGG (BURR) ×2 IMPLANT
BUR PRECISION FLUTE 6.0 (BURR) IMPLANT
CANISTER SUCT 3000ML PPV (MISCELLANEOUS) IMPLANT
CARTRIDGE OIL MAESTRO DRILL (MISCELLANEOUS) ×1 IMPLANT
COVER WAND RF STERILE (DRAPES) ×2 IMPLANT
DECANTER SPIKE VIAL GLASS SM (MISCELLANEOUS) ×2 IMPLANT
DERMABOND ADVANCED (GAUZE/BANDAGES/DRESSINGS) ×1
DERMABOND ADVANCED .7 DNX12 (GAUZE/BANDAGES/DRESSINGS) ×1 IMPLANT
DIFFUSER DRILL AIR PNEUMATIC (MISCELLANEOUS) ×2 IMPLANT
DRAPE HALF SHEET 40X57 (DRAPES) IMPLANT
DRAPE LAPAROTOMY 100X72X124 (DRAPES) ×2 IMPLANT
DRAPE SURG 17X23 STRL (DRAPES) ×2 IMPLANT
DRSG OPSITE POSTOP 4X6 (GAUZE/BANDAGES/DRESSINGS) ×2 IMPLANT
ELECT REM PT RETURN 9FT ADLT (ELECTROSURGICAL) ×2
ELECTRODE REM PT RTRN 9FT ADLT (ELECTROSURGICAL) ×1 IMPLANT
GAUZE 4X4 16PLY RFD (DISPOSABLE) IMPLANT
GAUZE SPONGE 4X4 12PLY STRL (GAUZE/BANDAGES/DRESSINGS) ×2 IMPLANT
GLOVE BIO SURGEON STRL SZ7 (GLOVE) IMPLANT
GLOVE BIO SURGEON STRL SZ8 (GLOVE) ×2 IMPLANT
GLOVE EXAM NITRILE XL STR (GLOVE) IMPLANT
GLOVE INDICATOR 8.5 STRL (GLOVE) ×2 IMPLANT
GLOVE SURG UNDER POLY LF SZ7 (GLOVE) IMPLANT
GOWN STRL REUS W/ TWL LRG LVL3 (GOWN DISPOSABLE) ×1 IMPLANT
GOWN STRL REUS W/ TWL XL LVL3 (GOWN DISPOSABLE) ×2 IMPLANT
GOWN STRL REUS W/TWL 2XL LVL3 (GOWN DISPOSABLE) IMPLANT
GOWN STRL REUS W/TWL LRG LVL3 (GOWN DISPOSABLE) ×2
GOWN STRL REUS W/TWL XL LVL3 (GOWN DISPOSABLE) ×4
KIT BASIN OR (CUSTOM PROCEDURE TRAY) ×2 IMPLANT
KIT TURNOVER KIT B (KITS) ×2 IMPLANT
NEEDLE HYPO 22GX1.5 SAFETY (NEEDLE) ×2 IMPLANT
NEEDLE SPNL 22GX3.5 QUINCKE BK (NEEDLE) ×2 IMPLANT
NS IRRIG 1000ML POUR BTL (IV SOLUTION) ×2 IMPLANT
OIL CARTRIDGE MAESTRO DRILL (MISCELLANEOUS) ×2
PACK LAMINECTOMY NEURO (CUSTOM PROCEDURE TRAY) ×2 IMPLANT
RASP 3.0MM (RASP) ×2 IMPLANT
SPONGE SURGIFOAM ABS GEL SZ50 (HEMOSTASIS) ×2 IMPLANT
STRIP CLOSURE SKIN 1/2X4 (GAUZE/BANDAGES/DRESSINGS) ×2 IMPLANT
SUT VIC AB 0 CT1 18XCR BRD8 (SUTURE) ×1 IMPLANT
SUT VIC AB 0 CT1 8-18 (SUTURE) ×2
SUT VIC AB 2-0 CT1 18 (SUTURE) ×2 IMPLANT
SUT VICRYL 4-0 PS2 18IN ABS (SUTURE) ×2 IMPLANT
TOWEL GREEN STERILE (TOWEL DISPOSABLE) ×2 IMPLANT
TOWEL GREEN STERILE FF (TOWEL DISPOSABLE) ×2 IMPLANT
WATER STERILE IRR 1000ML POUR (IV SOLUTION) ×2 IMPLANT

## 2021-01-09 NOTE — Op Note (Signed)
Preoperative diagnosis: Painful hardware  Postoperative diagnosis: Same    Procedure: Exploration fusion removal of bilateral loose T11 screws with cutting the rod just inferior to the T11 screws and removal.  Surgeon: Dominica Severin Deaundra Kutzer  Assistant: Nash Shearer  Anesthesia: General  EBL: Minimal  HPI: 65 year old gentleman longstanding issues with his back undergone previous thoracolumbar fusion presented with increased back pain in the upper part of his incision work-up revealed loosening of his T11 screws with a solid fusion inferiorly.  Because of this progress clinical syndrome imaging findings I recommended cutting the rod removal of loose screws.  I extensively over the risks and benefits of the operation with him as well as perioperative course expectations of outcome and alternatives of surgery and he understood and agreed to proceed forward.  Operative procedure: Patient was brought into the OR was due to general anesthesia positioned prone on the Wilson frame his back was prepped and draped in routine sterile fashion his superior aspect of his old incision was opened up I dissected down to identify the hardware and dissected the empty rod distal to the superior screw cut the rod just inferior to the T11 screw and then remove the screw and rod construct.  Meticulous hemostasis was maintained the wound was copiously irrigated and closed with interrupted Vicryl running 4 subcuticular Dermabond benzoin Steri-Strips.  At the end the case all needle counts and sponge counts were correct.

## 2021-01-09 NOTE — Discharge Summary (Signed)
  Physician Discharge Summary  Patient ID: Bradley Espinoza MRN: [id number] DOB/AGE: [birth year] 65 y.o. Estimated body mass index is 18.99 kg/m as calculated from the following:   Height as of this encounter: 6' (1.829 m).   Weight as of this encounter: 63.5 kg.   Admit date: 01/09/2021 Discharge date: 01/09/2021  Admission Diagnoses: Painful orthopedic hardware Discharge Diagnoses: Same Active Problems:   Painful orthopaedic hardware [facility])   Discharged Condition: good  Hospital Course: Patient admitted to hospital underwent removal of loose T11 screws postoperative care patient is doing well pain was well controlled patient be stable for discharge home scheduled follow-up in 1 to 2 weeks.  Consults: Significant Diagnostic Studies: Treatments: Removal of loose T11 screws Discharge Exam: Blood pressure (!) 154/86, pulse 80, temperature 97.6 F (36.4 C), resp. rate 14, height 6' (1.829 m), weight 63.5 kg, SpO2 97 %. Strength out of 5 on clean dry and intact  Disposition: Home   Allergies as of 01/09/2021      Reactions   Doxycycline Nausea Only   Levofloxacin Other (See Comments)   Blister in mouth   Codeine Nausea And Vomiting      Medication List    TAKE these medications   albuterol 108 (90 Base) MCG/ACT inhaler Commonly known as: VENTOLIN HFA Inhale 2 puffs into the lungs every 4 (four) hours as needed for wheezing or shortness of breath.   dicyclomine 10 MG capsule Commonly known as: Bentyl Take 1 capsule (10 mg total) by mouth every 12 (twelve) hours as needed for spasms.   docusate sodium 100 MG capsule Commonly known as: COLACE Take 100 mg by mouth daily as needed for mild constipation.   gabapentin 300 MG capsule Commonly known as: NEURONTIN Take 300 mg by mouth 2 (two) times daily.   metoCLOPramide 5 MG tablet Commonly known as: REGLAN Take 1 tablet (5 mg total) by mouth 4 (four) times daily -  before meals and at bedtime.   mirtazapine 7.5 MG  tablet Commonly known as: REMERON Take 1 tablet (7.5 mg total) by mouth at bedtime.   ondansetron 4 MG tablet Commonly known as: ZOFRAN TAKE (1) TABLET THREE TIMES DAILY AS NEEDED FOR NAUSEA & VOMITING. What changed: See the new instructions.   oxyCODONE-acetaminophen 10-325 MG tablet Commonly known as: PERCOCET Take 1 tablet by mouth every 4 (four) hours as needed for pain. What changed: when to take this   tamsulosin 0.4 MG Caps capsule Commonly known as: FLOMAX Take 0.4 mg by mouth in the morning and at bedtime.   testosterone cypionate 200 MG/ML injection Commonly known as: DEPOTESTOSTERONE CYPIONATE Inject 1 mL (200 mg total) into the muscle every 14 (fourteen) days.        Signed: Elaina Hoops 01/09/2021, 4:39 PM

## 2021-01-09 NOTE — Anesthesia Procedure Notes (Signed)
Procedure Name: Intubation Date/Time: 01/09/2021 3:00 PM Performed by: Lavell Luster, CRNA Pre-anesthesia Checklist: Patient identified, Emergency Drugs available, Suction available, Patient being monitored and Timeout performed Patient Re-evaluated:Patient Re-evaluated prior to induction Oxygen Delivery Method: Circle system utilized Preoxygenation: Pre-oxygenation with 100% oxygen Induction Type: IV induction Ventilation: Mask ventilation without difficulty Laryngoscope Size: Mac and 4 Grade View: Grade I Tube type: Oral Tube size: 7.5 mm Number of attempts: 1 Airway Equipment and Method: Stylet Placement Confirmation: ETT inserted through vocal cords under direct vision,  positive ETCO2 and breath sounds checked- equal and bilateral Secured at: 22 cm Tube secured with: Tape Dental Injury: Teeth and Oropharynx as per pre-operative assessment

## 2021-01-09 NOTE — Anesthesia Preprocedure Evaluation (Addendum)
Anesthesia Evaluation  Patient identified by MRN, date of birth, ID band Patient awake    Reviewed: Allergy & Precautions, NPO status , Patient's Chart, lab work & pertinent test results  History of Anesthesia Complications Negative for: history of anesthetic complications  Airway Mallampati: II  TM Distance: >3 FB Neck ROM: Full    Dental  (+) Missing, Dental Advisory Given, Poor Dentition, Caps   Pulmonary COPD,  COPD inhaler, Current SmokerPatient did not abstain from smoking.,  01/06/2021 SARS coronavirus NEG   breath sounds clear to auscultation       Cardiovascular negative cardio ROS   Rhythm:Regular Rate:Normal     Neuro/Psych  Headaches, Anxiety Depression Chronic back pain: narcotics    GI/Hepatic Neg liver ROS, GERD  Medicated and Controlled,  Endo/Other  negative endocrine ROS  Renal/GU negative Renal ROS     Musculoskeletal  (+) Arthritis ,   Abdominal   Peds  Hematology negative hematology ROS (+)   Anesthesia Other Findings   Reproductive/Obstetrics                            Anesthesia Physical Anesthesia Plan  ASA: II  Anesthesia Plan: General   Post-op Pain Management:    Induction: Intravenous  PONV Risk Score and Plan: 2 and Ondansetron and Dexamethasone  Airway Management Planned: Oral ETT  Additional Equipment: None  Intra-op Plan:   Post-operative Plan: Extubation in OR  Informed Consent: I have reviewed the patients History and Physical, chart, labs and discussed the procedure including the risks, benefits and alternatives for the proposed anesthesia with the patient or authorized representative who has indicated his/her understanding and acceptance.     Dental advisory given  Plan Discussed with: CRNA and Surgeon  Anesthesia Plan Comments:        Anesthesia Quick Evaluation

## 2021-01-09 NOTE — Anesthesia Postprocedure Evaluation (Signed)
Anesthesia Post Note  Patient: Bradley Espinoza  Procedure(s) Performed: Removal of hardware upper thoracic screws (N/A Back)     Patient location during evaluation: PACU Anesthesia Type: General Level of consciousness: awake and alert, patient cooperative and oriented Pain management: pain level controlled Vital Signs Assessment: post-procedure vital signs reviewed and stable Respiratory status: spontaneous breathing, nonlabored ventilation and respiratory function stable Cardiovascular status: blood pressure returned to baseline and stable Postop Assessment: no apparent nausea or vomiting and adequate PO intake Anesthetic complications: no   No complications documented.  Last Vitals:  Vitals:   01/09/21 1245 01/09/21 1555  BP: (!) 154/86   Pulse: 64 80  Resp: 17 14  Temp: 36.8 C 36.4 C  SpO2: 97% 97%    Last Pain:  Vitals:   01/09/21 1555  TempSrc:   PainSc: 0-No pain                 Khanh Tanori,E. Einer Meals

## 2021-01-09 NOTE — Progress Notes (Signed)
Pacu Discharge Note  Patient instuctions were given to family, brother Legrand Como. Wound care, diet, pain, follow up care and how and whom to contact with concerns were discussed. Family aware someone needs to remain with patient overnight and concerns after receiving anesthesia and what to avoid and safety. Answered all questions and concerns.   No Brace needed.   Discharge paperwork has clear contact informations for surgeon and 24 hour RN line for concerns.   Discussed what concerns to look for including infection and signs/symptoms to look for.   IV was removed prior to discharge. Patient was brought to car with belongings.   Pt exits my care.

## 2021-01-09 NOTE — Progress Notes (Addendum)
Pacu Nursing Note  Patient up and walking to BR. Able to void 350 in BR. Pain is 4/10, some spasm per patient. He will continue to watch, take his pain medication he has at home as directed on bottle every 6 hours for pain as needed.   Pt does have some stiffness. Instructed to do short walks to BR and kitchen at home the first couple of days. And he is aware to call if he needs more pain medication to Dr Saintclair Halsted his surgeon.   Pt's belongings of wallet, cell phone, bag went home with pt on d/c to car.   Patient tolerating fluids.   Pt exits my care.

## 2021-01-09 NOTE — Transfer of Care (Signed)
Immediate Anesthesia Transfer of Care Note  Patient: Bradley Espinoza  Procedure(s) Performed: Removal of hardware upper thoracic screws (N/A Back)  Patient Location: PACU  Anesthesia Type:General  Level of Consciousness: awake, alert  and oriented  Airway & Oxygen Therapy: Patient connected to face mask oxygen  Post-op Assessment: Post -op Vital signs reviewed and stable  Post vital signs: stable  Last Vitals:  Vitals Value Taken Time  BP    Temp    Pulse 73 01/09/21 1559  Resp 21 01/09/21 1559  SpO2 99 % 01/09/21 1559  Vitals shown include unvalidated device data.  Last Pain:  Vitals:   01/09/21 1306  TempSrc:   PainSc: 6          Complications: No complications documented.

## 2021-01-09 NOTE — H&P (Signed)
Bradley Espinoza is an 65 y.o. male.   Chief Complaint: Back pain HPI: 65 year old gentleman longstanding chronic back pain with gotten acutely worse recently work-up revealed loosening of his superior T11 screws and due to his progression of clinical syndrome imaging findings of a conservative treatment I recommended removal of this T11 screws he appears fused below this.  I extensively gone over the risks and benefits of cutting of the rod and removal of loose hardware as well as perioperative course expectations of outcome and alternatives of surgery and he understands and agrees to proceed forward.  Past Medical History:  Diagnosis Date  . Anxiety   . Arthritis    low back pain, lumbar radiculopathy  . Depression   . Fatty liver   . Fracture    B/L ankles  . GERD (gastroesophageal reflux disease)   . Headache(784.0)    allergy related   . History of kidney stones    passed stones  . History of stomach ulcers   . Retained orthopedic hardware    failed retained hardware right foot  . Wears glasses   . Wears glasses   . Wears partial dentures    upper    Past Surgical History:  Procedure Laterality Date  . AMPUTATION Right 01/29/2020   Procedure: RIGHT BELOW KNEE AMPUTATION;  Surgeon: Newt Minion, MD;  Location: [place];  Service: Orthopedics;  Laterality: Right;  . ANKLE FUSION Right 05/11/2015   Procedure: Right Posterior Arthroscopic Subtalar Arthrodesis;  Surgeon: Newt Minion, MD;  Location: [place];  Service: Orthopedics;  Laterality: Right;  . ANKLE FUSION Right 11/27/2017   Procedure: RIGHT TIBIOCALCANEAL FUSION;  Surgeon: Newt Minion, MD;  Location: [place];  Service: Orthopedics;  Laterality: Right;  . ANKLE FUSION Right 11/26/2018   Procedure: RIGHT ANTERIOR ANKLE FUSION, APPLY VAC;  Surgeon: Newt Minion, MD;  Location: [place];  Service: Orthopedics;  Laterality: Right;  . ANKLE FUSION Right 09/09/2019   Procedure: REVISION RIGHT ANKLE FUSION;  Surgeon: Newt Minion, MD;  Location: [place];  Service: Orthopedics;  Laterality: Right;  . ANTERIOR LAT LUMBAR FUSION N/A 04/05/2017   Procedure: Extreme Lateral Interbody Fusion - Lumbar three-lumbar four ,exploration of fusion Posterior augmentation with globus addition Removal hardware Lumbar one-three. Lumbar four-sacral one,  Removal internal bone growth stimulator;  Surgeon: Kary Kos, MD;  Location: [place];  Service: Neurosurgery;  Laterality: N/A;  . APPLICATION OF WOUND VAC  12/23/2018   Procedure: Application Of Wound Vac;  Surgeon: Newt Minion, MD;  Location: [place];  Service: Orthopedics;;  . BACK SURGERY  2004   x 2  . BIOPSY  06/05/2019   Procedure: BIOPSY;  Surgeon: Rogene Houston, MD;  [facility];  Service: Endoscopy;;  gastric biopsy  . CERVICAL SPINE SURGERY  2008  . CHOLECYSTECTOMY N/A 07/06/2019   Procedure: LAPAROSCOPIC CHOLECYSTECTOMY;  Surgeon: Virl Cagey, MD;  [facility];  Service: General;  Laterality: N/A;  . COLONOSCOPY WITH PROPOFOL N/A 06/05/2019   Procedure: COLONOSCOPY WITH PROPOFOL;  Surgeon: Rogene Houston, MD;  [facility];  Service: Endoscopy;  Laterality: N/A;  . ESOPHAGOGASTRODUODENOSCOPY    . ESOPHAGOGASTRODUODENOSCOPY (EGD) WITH PROPOFOL N/A 06/05/2019   Procedure: ESOPHAGOGASTRODUODENOSCOPY (EGD) WITH PROPOFOL;  Surgeon: Rogene Houston, MD;  [facility];  Service: Endoscopy;  Laterality: N/A;  1220pm  . HARDWARE REMOVAL Right 10/09/2014   Procedure: Removal Deep Hardware, Irrigation and Debridement Calcaneus, Place Antibiotic  Beads and Wound VAC ;  Surgeon: Newt Minion, MD;  Location: [place];  Service: Orthopedics;  Laterality: Right;  . HARDWARE REMOVAL Right 08/12/2015   Procedure: Removal Deep Hardware Right Foot;  Surgeon: Newt Minion, MD;  Location: [place];  Service: Orthopedics;  Laterality: Right;  . HARDWARE REMOVAL Right 11/26/2018   Procedure: REMOVAL RIGHT TIBIOCALCANEAL NAIL;  Surgeon: Newt Minion, MD;   Location: [place];  Service: Orthopedics;  Laterality: Right;  . HARDWARE REMOVAL Right 12/23/2018   Procedure: RIGHT ANKLE REMOVE HARDWARE, PLACE ANTIBIOTIC BEADS and placement of wound vac;  Surgeon: Newt Minion, MD;  Location: [place];  Service: Orthopedics;  Laterality: Right;  . HARDWARE REMOVAL Right 09/29/2019   Procedure: RIGHT ANKLE REMOVE HARDWARE, DEBRIDEMENT;  Surgeon: Newt Minion, MD;  Location: [place];  Service: Orthopedics;  Laterality: Right;  . I & D EXTREMITY Right 09/15/2014   Procedure: IRRIGATION AND DEBRIDEMENT Ankle;  Surgeon: Renette Butters, MD;  Location: [place];  Service: Orthopedics;  Laterality: Right;  . INGUINAL HERNIA REPAIR Bilateral   . LAMINECTOMY WITH POSTERIOR LATERAL ARTHRODESIS LEVEL 4 N/A 09/10/2018   Procedure: Posterior Lateral Fusion - Thoracic Eleven-Thoracic Twelve - Thoracic Twelve-Lumbar One - Lumbar One-Lumbar Two - Lumbar Two-Lumbar Three with instrumentaion and PLA;  Surgeon: Kary Kos, MD;  Location: [place];  Service: Neurosurgery;  Laterality: N/A;  Posterior Lateral Fusion - Thoracic Eleven-Thoracic Twelve - Thoracic Twelve-Lumbar One - Lumbar One-Lumbar Two - Lumbar Two-Lumbar Thre  . LIVER BIOPSY N/A 07/06/2019   Procedure: LIVER BIOPSY;  Surgeon: Virl Cagey, MD;  [facility];  Service: General;  Laterality: N/A;  . LUMBAR PERCUTANEOUS PEDICLE SCREW 1 LEVEL N/A 04/05/2017   Procedure: LUMBAR PERCUTANEOUS PEDICLE SCREW LUMBAR THREE-FOUR;  Surgeon: Kary Kos, MD;  Location: [place];  Service: Neurosurgery;  Laterality: N/A;  . ORIF CALCANEOUS FRACTURE Right 09/19/2014   Procedure: OPEN REDUCTION INTERNAL FIXATION (ORIF) CALCANEOUS FRACTURE;  Surgeon: Newt Minion, MD;  Location: [place];  Service: Orthopedics;  Laterality: Right;  . ORIF CALCANEOUS FRACTURE Left 09/19/2014   Procedure: OPEN REDUCTION INTERNAL FIXATION (ORIF) CALCANEOUS FRACTURE;  Surgeon: Newt Minion, MD;  Location: [place];  Service: Orthopedics;  Laterality: Left;  .  POLYPECTOMY  06/05/2019   Procedure: POLYPECTOMY;  Surgeon: Rogene Houston, MD;  [facility];  Service: Endoscopy;;  cecal polyp biopsy forcep, ascending polyp cold snare    Family History  Problem Relation Age of Onset  . Diabetes Father   . Cancer Other    Social History:  reports that he quit smoking about 3 years ago. His smoking use included cigars. He started smoking about 44 years ago. He has a 10.00 pack-year smoking history. He has never used smokeless tobacco. He reports that he does not drink alcohol and does not use drugs.  Allergies:  Allergies  Allergen Reactions  . Doxycycline Nausea Only  . Levofloxacin Other (See Comments)    Blister in mouth  . Codeine Nausea And Vomiting    Medications Prior to Admission  Medication Sig Dispense Refill  . albuterol (VENTOLIN HFA) 108 (90 Base) MCG/ACT inhaler Inhale 2 puffs into the lungs every 4 (four) hours as needed for wheezing or shortness of breath.    . dicyclomine (BENTYL) 10 MG capsule Take 1 capsule (10 mg total) by mouth every 12 (twelve) hours as needed for spasms. 60 capsule 2  . docusate sodium (COLACE) 100 MG capsule Take 100 mg  by mouth daily as needed for mild constipation.     . gabapentin (NEURONTIN) 300 MG capsule Take 300 mg by mouth 2 (two) times daily.    . metoCLOPramide (REGLAN) 5 MG tablet Take 1 tablet (5 mg total) by mouth 4 (four) times daily -  before meals and at bedtime. 360 tablet 2  . mirtazapine (REMERON) 7.5 MG tablet Take 1 tablet (7.5 mg total) by mouth at bedtime. 90 tablet 1  . ondansetron (ZOFRAN) 4 MG tablet TAKE (1) TABLET THREE TIMES DAILY AS NEEDED FOR NAUSEA & VOMITING. (Patient taking differently: Take 4 mg by mouth every 8 (eight) hours as needed for nausea or vomiting.) 90 tablet 1  . oxyCODONE-acetaminophen (PERCOCET) 10-325 MG tablet Take 1 tablet by mouth every 4 (four) hours as needed for pain. (Patient taking differently: Take 1 tablet by mouth every 6 (six) hours as  needed for pain.) 30 tablet 0  . tamsulosin (FLOMAX) 0.4 MG CAPS capsule Take 0.4 mg by mouth in the morning and at bedtime.    Marland Kitchen testosterone cypionate (DEPOTESTOSTERONE CYPIONATE) 200 MG/ML injection Inject 1 mL (200 mg total) into the muscle every 14 (fourteen) days. 6 mL 3    Results for orders placed or performed during the hospital encounter of 01/09/21 (from the past 48 hour(s))  CBC per protocol     Status: Abnormal   Collection Time: 01/09/21 12:31 PM  Result Value Ref Range   WBC 7.6 4.0 - 10.5 K/uL   RBC 5.44 4.22 - 5.81 MIL/uL   Hemoglobin 15.9 13.0 - 17.0 g/dL   HCT 49.1 39.0 - 52.0 %   MCV 90.3 80.0 - 100.0 fL   MCH 29.2 26.0 - 34.0 pg   MCHC 32.4 30.0 - 36.0 g/dL   RDW 16.0 (H) 11.5 - 15.5 %   Platelets 206 150 - 400 K/uL   nRBC 0.0 0.0 - 0.2 %    Comment: Performed at [facility], [place] [address], [place], [place] [address]   No results found.  Review of Systems  Musculoskeletal: Positive for back pain.    Blood pressure (!) 154/86, pulse 64, temperature 98.2 F (36.8 C), temperature source Oral, resp. rate 17, height 6' (1.829 m), weight 63.5 kg, SpO2 97 %. Physical Exam HENT:     Right Ear: Tympanic membrane normal.     Nose: Nose normal.     Mouth/Throat:     Mouth: Mucous membranes are moist.  Eyes:     Pupils: Pupils are equal, round, and reactive to light.  Cardiovascular:     Pulses: Normal pulses.  Pulmonary:     Effort: Pulmonary effort is normal.  Abdominal:     General: Abdomen is flat.  Musculoskeletal:        General: Normal range of motion.  Skin:    General: Skin is warm.  Neurological:     General: No focal deficit present.     Mental Status: He is alert.     Comments: Strength is 5/5 iliopsoas, quads, hamstrings, gastrocs, and tibialis, and EHL.      Assessment/Plan 64-year gentleman presents for removal of painful hardware  Elaina Hoops, MD 01/09/2021, 2:10 PM

## 2021-01-09 NOTE — Discharge Instructions (Signed)
Orthopedic Hardware Removal, Care After This sheet gives you information about how to care for yourself after your procedure. Your health care provider may also give you more specific instructions. If you have problems or questions, contact your health care provider. What can I expect after the procedure? After the procedure, it is common to have:  Soreness or pain.  Some swelling in the area where the hardware was removed.  A small amount of blood or clear fluid coming from your incision. Follow these instructions at home: If you have a cast:  Do not stick anything inside the cast to scratch your skin. Doing that increases your risk of infection.  Check the skin around the cast every day. Tell your health care provider about any concerns.  You may put lotion on dry skin around the edges of the cast. Do not put lotion on the skin underneath the cast.  Keep the cast clean and dry. If you have a splint or boot:  Wear the splint or boot as told by your health care provider. Remove it only as told by your health care provider.  Loosen the splint or boot if your fingers or toes tingle, become numb, or turn cold and blue.  Keep the splint or boot clean and dry. Bathing  Do not take baths, swim, or use a hot tub until your health care provider approves. Ask your health care provider if you may take showers. You may only be allowed to take sponge baths.  Keep the bandage (dressing) dry until your health care provider says it can be removed.  If your cast, splint, or boot is not waterproof: ? Do not let it get wet. ? Cover it with a watertight covering when you take a bath or a shower. Incision care  Follow instructions from your health care provider about how to take care of your incision. Make sure you: ? Wash your hands with soap and water before you change your dressing. If soap and water are not available, use hand sanitizer. ? Change your dressing as told by your health care  provider. ? Leave stitches (sutures), skin glue, or adhesive strips in place. These skin closures may need to stay in place for 2 weeks or longer. If adhesive strip edges start to loosen and curl up, you may trim the loose edges. Do not remove adhesive strips completely unless your health care provider tells you to do that.  Check your incision area every day for signs of infection. Check for: ? Redness. ? More swelling or pain. ? More fluid or blood. ? Warmth. ? Pus or a bad smell.   Managing pain, stiffness, and swelling  If directed, put ice on the affected area: ? If you have a removable splint or boot, remove it as told by your health care provider. ? Put ice in a plastic bag. ? Place a towel between your skin and the bag. ? Leave the ice on for 20 minutes, 2-3 times a day.  Move your fingers or toes often to avoid stiffness and to lessen swelling.  Raise (elevate) the injured area above the level of your heart while you are sitting or lying down.   Driving  Do not drive or use heavy machinery while taking prescription pain medicine.  Do not drive for 24 hours if you were given a medicine to help you relax (sedative) during your procedure.  Ask your health care provider when it is safe to drive if you have a cast,  splint, or boot on the affected limb. Activity  Ask your health care provider what activities are safe for you during recovery, and ask what activities you need to avoid.  Do not use the injured limb to support your body weight until your health care provider says that you can.  Do not play contact sports until your health care provider approves.  Do exercises as told by your health care provider.  Avoid sitting for a long time without moving. Get up and move around at least every few hours. This will help prevent blood clots. General instructions  Do not put pressure on any part of the cast or splint until it is fully hardened. This may take several  hours.  If you are taking prescription pain medicine, take actions to prevent or treat constipation. Your health care provider may recommend that you: ? Drink enough fluid to keep your urine pale yellow. ? Eat foods that are high in fiber, such as fresh fruits and vegetables, whole grains, and beans. ? Limit foods that are high in fat and processed sugars, such as fried or sweet foods. ? Take an over-the-counter or prescription medicine for constipation.  Do not use any products that contain nicotine or tobacco, such as cigarettes and e-cigarettes. These can delay bone healing after surgery. If you need help quitting, ask your health care provider.  Take over-the-counter and prescription medicines only as told by your health care provider.  Keep all follow-up visits as told by your health care provider. This is important. Contact a health care provider if:  You have lasting pain.  You have redness around your incision.  You have more swelling or pain around your incision.  You have more fluid or blood coming from your incision.  Your incision feels warm to the touch.  You have pus or a bad smell coming from your incision.  You are unable to do exercises or physical activity as told by your health care provider. Get help right away if:  You have difficulty breathing.  You have chest pain.  You have severe pain.  You have a fever or chills.  You have numbness for more than 24 hours in the area where the hardware was removed. Summary  After the procedure, it is common to have some pain and swelling in the area where the hardware was removed.  Follow instructions from your health care provider about how to take care of your incision.  Return to your normal activities as told by your health care provider. Ask your health care provider what activities are safe for you. This information is not intended to replace advice given to you by your health care provider. Make sure you  discuss any questions you have with your health care provider. Document Revised: 11/20/2018 Document Reviewed: 10/17/2017 Elsevier Patient Education  2021 [place] Anesthesia, Adult, Care After This sheet gives you information about how to care for yourself after your procedure. Your health care provider may also give you more specific instructions. If you have problems or questions, contact your health care provider. What can I expect after the procedure? After the procedure, the following side effects are common:  Pain or discomfort at the IV site.  Nausea.  Vomiting.  Sore throat.  Trouble concentrating.  Feeling cold or chills.  Feeling weak or tired.  Sleepiness and fatigue.  Soreness and body aches. These side effects can affect parts of the body that were not involved in surgery. Follow  these instructions at home: For the time period you were told by your health care provider:  Rest.  Do not participate in activities where you could fall or become injured.  Do not drive or use machinery.  Do not drink alcohol.  Do not take sleeping pills or medicines that cause drowsiness.  Do not make important decisions or sign legal documents.  Do not take care of children on your own.   Eating and drinking  Follow any instructions from your health care provider about eating or drinking restrictions.  When you feel hungry, start by eating small amounts of foods that are soft and easy to digest (bland), such as toast. Gradually return to your regular diet.  Drink enough fluid to keep your urine pale yellow.  If you vomit, rehydrate by drinking water, juice, or clear broth. General instructions  If you have sleep apnea, surgery and certain medicines can increase your risk for breathing problems. Follow instructions from your health care provider about wearing your sleep device: ? Anytime you are sleeping, including during daytime naps. ? While taking  prescription pain medicines, sleeping medicines, or medicines that make you drowsy.  Have a responsible adult stay with you for the time you are told. It is important to have someone help care for you until you are awake and alert.  Return to your normal activities as told by your health care provider. Ask your health care provider what activities are safe for you.  Take over-the-counter and prescription medicines only as told by your health care provider.  If you smoke, do not smoke without supervision.  Keep all follow-up visits as told by your health care provider. This is important. Contact a health care provider if:  You have nausea or vomiting that does not get better with medicine.  You cannot eat or drink without vomiting.  You have pain that does not get better with medicine.  You are unable to pass urine.  You develop a skin rash.  You have a fever.  You have redness around your IV site that gets worse. Get help right away if:  You have difficulty breathing.  You have chest pain.  You have blood in your urine or stool, or you vomit blood. Summary  After the procedure, it is common to have a sore throat or nausea. It is also common to feel tired.  Have a responsible adult stay with you for the time you are told. It is important to have someone help care for you until you are awake and alert.  When you feel hungry, start by eating small amounts of foods that are soft and easy to digest (bland), such as toast. Gradually return to your regular diet.  Drink enough fluid to keep your urine pale yellow.  Return to your normal activities as told by your health care provider. Ask your health care provider what activities are safe for you. This information is not intended to replace advice given to you by your health care provider. Make sure you discuss any questions you have with your health care provider. Document Revised: 06/09/2020 Document Reviewed:  01/07/2020 Elsevier Patient Education  2021 [employer].

## 2021-01-10 ENCOUNTER — Encounter (HOSPITAL_COMMUNITY): Payer: Self-pay | Admitting: Neurosurgery

## 2021-02-06 ENCOUNTER — Ambulatory Visit (INDEPENDENT_AMBULATORY_CARE_PROVIDER_SITE_OTHER): Payer: PPO | Admitting: Physician Assistant

## 2021-02-06 ENCOUNTER — Encounter: Payer: Self-pay | Admitting: Orthopedic Surgery

## 2021-02-06 ENCOUNTER — Telehealth: Payer: Self-pay

## 2021-02-06 DIAGNOSIS — M19171 Post-traumatic osteoarthritis, right ankle and foot: Secondary | ICD-10-CM

## 2021-02-06 DIAGNOSIS — M25562 Pain in left knee: Secondary | ICD-10-CM

## 2021-02-06 DIAGNOSIS — M25561 Pain in right knee: Secondary | ICD-10-CM

## 2021-02-06 DIAGNOSIS — G8929 Other chronic pain: Secondary | ICD-10-CM

## 2021-02-06 MED ORDER — LIDOCAINE HCL 1 % IJ SOLN
5.0000 mL | INTRAMUSCULAR | Status: AC | PRN
  Administered 2021-02-06: 5 mL

## 2021-02-06 MED ORDER — METHYLPREDNISOLONE ACETATE 40 MG/ML IJ SUSP
40.0000 mg | INTRAMUSCULAR | Status: AC | PRN
  Administered 2021-02-06: 40 mg via INTRA_ARTICULAR

## 2021-02-06 NOTE — Progress Notes (Signed)
Office Visit Note   Patient: Bradley Espinoza           Date of Birth: 14-Nov-1955           MRN: [id number] Visit Date: 02/06/2021              Requested by: Celene Squibb, MD [place],  [place] [address] PCP: Celene Squibb, MD  Chief Complaint  Patient presents with  . Left Knee - Pain    Last injection       HPI: Patient comes today requesting an injection into his left knee.  He did have a gel injection about a year ago and did quite well.  He has a history of left knee arthritis and he has had to rely on his left leg when he was recovering from his right below-knee amputation  Assessment & Plan: Visit Diagnoses: No diagnosis found.  Plan: Patient was injected today.  We will place authorization for gel injections we will follow-up at that time  Follow-Up Instructions: No follow-ups on file.   Ortho Exam  Patient is alert, oriented, no adenopathy, well-dressed, normal affect, normal respiratory effort. Left knee he has significant quadricep atrophy tenderness over the medial lateral joint line.  No effusion no signs of infection  Imaging: No results found. No images are attached to the encounter.  Labs: Lab Results  Component Value Date   HGBA1C 5.1 06/26/2016   ESRSEDRATE 9 12/08/2018   REPTSTATUS 10/04/2019 FINAL 09/29/2019   GRAMSTAIN  09/29/2019    ABUNDANT WBC PRESENT, PREDOMINANTLY PMN FEW GRAM POSITIVE COCCI    CULT  09/29/2019    FEW STAPHYLOCOCCUS AUREUS NO ANAEROBES ISOLATED Performed at [facility], [place] [address] [place], [place] [address]    LABORGA STAPHYLOCOCCUS AUREUS 09/29/2019     Lab Results  Component Value Date   ALBUMIN 4.4 07/02/2019   ALBUMIN 4.3 08/12/2015   ALBUMIN 4.1 05/06/2015    No results found for: MG No results found for: VD25OH  No results found for: PREALBUMIN CBC EXTENDED Latest Ref Rng & Units 01/09/2021 01/29/2020 09/29/2019  WBC 4.0 - 10.5 K/uL 7.6 6.7 6.8  RBC 4.22 - 5.81 MIL/uL 5.44  5.34 4.67  HGB 13.0 - 17.0 g/dL 15.9 15.2 13.1  HCT 39.0 - 52.0 % 49.1 48.1 42.0  PLT 150 - 400 K/uL 206 221 305  NEUTROABS 1.7 - 7.7 K/uL - - -  LYMPHSABS 0.7 - 4.0 K/uL - - -     There is no height or weight on file to calculate BMI.  Orders:  No orders of the defined types were placed in this encounter.  No orders of the defined types were placed in this encounter.    Procedures: Large Joint Inj: L knee on 02/06/2021 10:37 AM Indications: pain and diagnostic evaluation Details: 22 G 1.5 in needle  Arthrogram: No  Medications: 40 mg methylPREDNISolone acetate 40 MG/ML; 5 mL lidocaine 1 % Outcome: tolerated well, no immediate complications Procedure, treatment alternatives, risks and benefits explained, specific risks discussed. Consent was given by the patient. Immediately prior to procedure a time out was called to verify the correct patient, procedure, equipment, support staff and site/side marked as required. Patient was prepped and draped in the usual sterile fashion.      Clinical Data: No additional findings.  ROS:  All other systems negative, except as noted in the HPI. Review of Systems  Objective: Vital Signs: There were no vitals  taken for this visit.  Specialty Comments:  No specialty comments available.  PMFS History: Patient Active Problem List   Diagnosis Date Noted  . Painful orthopaedic hardware ([place]) 01/09/2021  . Abdominal pain, left lower quadrant 12/15/2020  . Loss of weight 12/15/2020  . Chronic, continuous use of opioids 12/15/2020  . Acute hematogenous osteomyelitis of right foot ([place]) 01/29/2020  . Post-traumatic arthritis of ankle, right   . Cutaneous abscess of right ankle   . Subacute osteomyelitis of right tibia ([place])   . Fatty liver 06/30/2019  . Calculus of gallbladder without cholecystitis without obstruction 06/30/2019  . Nausea without vomiting 05/13/2019  . Abdominal bloating 05/13/2019  . Wound infection following  procedure 12/19/2018  . Malunion of joint fusion ([place]) 11/26/2018  . 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[facility])   . Status post lumbar spinal fusion 09/10/2018  . S/P ankle fusion 11/27/2017  . Nonunion of subtalar arthrodesis   . Avascular necrosis of talus, right ([place])   . Post-traumatic osteoarthritis, left ankle and foot 10/22/2017  . DDD (degenerative disc disease), lumbar 04/05/2017  . Fracture of L2 vertebra ([place]) 01/12/2015  . Acute osteomyelitis of calcaneum ([place]) 10/09/2014  . Dysuria 10/05/2014  . Cellulitis 10/05/2014  . Fall from ladder 09/21/2014  . L2 vertebral fracture ([place]) 09/21/2014  . Bilateral calcaneal fractures 09/21/2014  . Chronic pain 09/21/2014  . Acute blood loss anemia 09/21/2014  . Open fracture of both calcanei 09/15/2014   Past Medical History:  Diagnosis Date  . Anxiety   . Arthritis    low back pain, lumbar radiculopathy  . Depression   . Fatty liver   . Fracture    B/L ankles  . GERD (gastroesophageal reflux disease)   . Headache(784.0)    allergy related   . History of kidney stones    passed stones  . History of stomach ulcers   . Retained orthopedic hardware    failed retained hardware right foot  . Wears glasses   . Wears glasses   . Wears partial dentures    upper    Family History  Problem Relation Age of Onset  . Diabetes Father   . Cancer Other     Past Surgical History:  Procedure Laterality Date  . AMPUTATION Right 01/29/2020   Procedure: RIGHT BELOW KNEE AMPUTATION;  Surgeon: Newt Minion, MD;  Location: [facility];  Service: Orthopedics;  Laterality: Right;  . ANKLE FUSION Right 05/11/2015   Procedure: Right Posterior Arthroscopic Subtalar Arthrodesis;  Surgeon: Newt Minion, MD;  Location: [facility];  Service: Orthopedics;  Laterality: Right;  . ANKLE FUSION Right 11/27/2017   Procedure: RIGHT TIBIOCALCANEAL FUSION;  Surgeon: Newt Minion, MD;  Location: [facility];  Service: Orthopedics;  Laterality: Right;  . ANKLE FUSION Right 11/26/2018    Procedure: RIGHT ANTERIOR ANKLE FUSION, APPLY VAC;  Surgeon: Newt Minion, MD;  Location: [facility];  Service: Orthopedics;  Laterality: Right;  . ANKLE FUSION Right 09/09/2019   Procedure: REVISION RIGHT ANKLE FUSION;  Surgeon: Newt Minion, MD;  Location: [facility];  Service: Orthopedics;  Laterality: Right;  . ANTERIOR LAT LUMBAR FUSION N/A 04/05/2017   Procedure: Extreme Lateral Interbody Fusion - Lumbar three-lumbar four ,exploration of fusion Posterior augmentation with globus addition Removal hardware Lumbar one-three. Lumbar four-sacral one,  Removal internal bone growth stimulator;  Surgeon: Kary Kos, MD;  Location: [facility];  Service: Neurosurgery;  Laterality: N/A;  . APPLICATION OF WOUND VAC  12/23/2018   Procedure: Application Of Wound Vac;  Surgeon: Newt Minion, MD;  Location: [facility];  Service: Orthopedics;;  . BACK SURGERY  2004   x 2  . BIOPSY  06/05/2019   Procedure: BIOPSY;  Surgeon: Rogene Houston, MD;  [facility];  Service: Endoscopy;;  gastric biopsy  . CERVICAL SPINE SURGERY  2008  . CHOLECYSTECTOMY N/A 07/06/2019   Procedure: LAPAROSCOPIC CHOLECYSTECTOMY;  Surgeon: Virl Cagey, MD;  [facility];  Service: General;  Laterality: N/A;  . COLONOSCOPY WITH PROPOFOL N/A 06/05/2019   Procedure: COLONOSCOPY WITH PROPOFOL;  Surgeon: Rogene Houston, MD;  [facility];  Service: Endoscopy;  Laterality: N/A;  . ESOPHAGOGASTRODUODENOSCOPY    . ESOPHAGOGASTRODUODENOSCOPY (EGD) WITH PROPOFOL N/A 06/05/2019   Procedure: ESOPHAGOGASTRODUODENOSCOPY (EGD) WITH PROPOFOL;  Surgeon: Rogene Houston, MD;  [facility];  Service: Endoscopy;  Laterality: N/A;  1220pm  . HARDWARE REMOVAL Right 10/09/2014   Procedure: Removal Deep Hardware, Irrigation and Debridement Calcaneus, Place Antibiotic Beads and Wound VAC ;  Surgeon: Newt Minion, MD;  Location: [facility];  Service: Orthopedics;  Laterality: Right;  . HARDWARE REMOVAL Right 08/12/2015   Procedure:  Removal Deep Hardware Right Foot;  Surgeon: Newt Minion, MD;  Location: [facility];  Service: Orthopedics;  Laterality: Right;  . HARDWARE REMOVAL Right 11/26/2018   Procedure: REMOVAL RIGHT TIBIOCALCANEAL NAIL;  Surgeon: Newt Minion, MD;  Location: [facility];  Service: Orthopedics;  Laterality: Right;  . HARDWARE REMOVAL Right 12/23/2018   Procedure: RIGHT ANKLE REMOVE HARDWARE, PLACE ANTIBIOTIC BEADS and placement of wound vac;  Surgeon: Newt Minion, MD;  Location: [facility];  Service: Orthopedics;  Laterality: Right;  . HARDWARE REMOVAL Right 09/29/2019   Procedure: RIGHT ANKLE REMOVE HARDWARE, DEBRIDEMENT;  Surgeon: Newt Minion, MD;  Location: [facility];  Service: Orthopedics;  Laterality: Right;  . HARDWARE REMOVAL N/A 01/09/2021   Procedure: Removal of hardware upper thoracic screws;  Surgeon: Kary Kos, MD;  Location: [facility];  Service: Neurosurgery;  Laterality: N/A;  . I & D EXTREMITY Right 09/15/2014   Procedure: IRRIGATION AND DEBRIDEMENT Ankle;  Surgeon: Renette Butters, MD;  Location: [facility];  Service: Orthopedics;  Laterality: Right;  . INGUINAL HERNIA REPAIR Bilateral   . LAMINECTOMY WITH POSTERIOR LATERAL ARTHRODESIS LEVEL 4 N/A 09/10/2018   Procedure: Posterior Lateral Fusion - Thoracic Eleven-Thoracic Twelve - Thoracic Twelve-Lumbar One - Lumbar One-Lumbar Two - Lumbar Two-Lumbar Three with instrumentaion and PLA;  Surgeon: Kary Kos, MD;  Location: [facility];  Service: Neurosurgery;  Laterality: N/A;  Posterior Lateral Fusion - Thoracic Eleven-Thoracic Twelve - Thoracic Twelve-Lumbar One - Lumbar One-Lumbar Two - Lumbar Two-Lumbar Thre  . LIVER BIOPSY N/A 07/06/2019   Procedure: LIVER BIOPSY;  Surgeon: Virl Cagey, MD;  [facility];  Service: General;  Laterality: N/A;  . LUMBAR PERCUTANEOUS PEDICLE SCREW 1 LEVEL N/A 04/05/2017   Procedure: LUMBAR PERCUTANEOUS PEDICLE SCREW LUMBAR THREE-FOUR;  Surgeon: Kary Kos, MD;  Location: [facility];  Service: Neurosurgery;  Laterality: N/A;  . ORIF  CALCANEOUS FRACTURE Right 09/19/2014   Procedure: OPEN REDUCTION INTERNAL FIXATION (ORIF) CALCANEOUS FRACTURE;  Surgeon: Newt Minion, MD;  Location: [facility];  Service: Orthopedics;  Laterality: Right;  . ORIF CALCANEOUS FRACTURE Left 09/19/2014   Procedure: OPEN REDUCTION INTERNAL FIXATION (ORIF) CALCANEOUS FRACTURE;  Surgeon: Newt Minion, MD;  Location: [facility];  Service: Orthopedics;  Laterality: Left;  . POLYPECTOMY  06/05/2019   Procedure: POLYPECTOMY;  Surgeon: Rogene Houston, MD;  [facility];  Service: Endoscopy;;  cecal polyp biopsy forcep, ascending polyp cold snare   Social History   Occupational History    Comment: disabled  Tobacco Use  . Smoking status: Former Smoker    Packs/day: 1.00    Years: 10.00    Pack years: 10.00    Types: Cigars    Start date: 10/08/1976    Quit date: 09/2017    Years since quitting: 3.4  . Smokeless tobacco: Never Used  . Tobacco comment: quit smoking cegar 09/10/2018  Vaping Use  . Vaping Use: Never used  Substance and Sexual Activity  . Alcohol use: No    Comment:  quit 1998  . Drug use: No  . Sexual activity: Yes    Birth control/protection: None

## 2021-02-06 NOTE — Telephone Encounter (Signed)
Left knee prior auth gel injection OA left knee

## 2021-02-07 ENCOUNTER — Other Ambulatory Visit (INDEPENDENT_AMBULATORY_CARE_PROVIDER_SITE_OTHER): Payer: Self-pay | Admitting: Gastroenterology

## 2021-02-07 NOTE — Telephone Encounter (Signed)
Last seen for bloating by Dr. Jenetta Downer in March 2022

## 2021-02-07 NOTE — Telephone Encounter (Signed)
Noted  

## 2021-02-14 DIAGNOSIS — M47816 Spondylosis without myelopathy or radiculopathy, lumbar region: Secondary | ICD-10-CM | POA: Diagnosis not present

## 2021-02-21 DIAGNOSIS — G4486 Cervicogenic headache: Secondary | ICD-10-CM | POA: Diagnosis not present

## 2021-02-21 DIAGNOSIS — M47812 Spondylosis without myelopathy or radiculopathy, cervical region: Secondary | ICD-10-CM | POA: Diagnosis not present

## 2021-03-13 DIAGNOSIS — Z981 Arthrodesis status: Secondary | ICD-10-CM | POA: Diagnosis not present

## 2021-03-13 DIAGNOSIS — M47812 Spondylosis without myelopathy or radiculopathy, cervical region: Secondary | ICD-10-CM | POA: Diagnosis not present

## 2021-03-17 ENCOUNTER — Telehealth: Payer: Self-pay

## 2021-03-17 NOTE — Telephone Encounter (Signed)
VOB has been submitted for Monovisc, left knee. Pending BV. 

## 2021-03-20 ENCOUNTER — Encounter (INDEPENDENT_AMBULATORY_CARE_PROVIDER_SITE_OTHER): Payer: Self-pay | Admitting: Gastroenterology

## 2021-03-20 ENCOUNTER — Ambulatory Visit (INDEPENDENT_AMBULATORY_CARE_PROVIDER_SITE_OTHER): Payer: PPO | Admitting: Gastroenterology

## 2021-03-20 ENCOUNTER — Telehealth: Payer: Self-pay

## 2021-03-20 ENCOUNTER — Other Ambulatory Visit: Payer: Self-pay

## 2021-03-20 VITALS — BP 167/86 | HR 70 | Temp 98.5°F | Ht 72.0 in | Wt 145.0 lb

## 2021-03-20 DIAGNOSIS — R110 Nausea: Secondary | ICD-10-CM | POA: Diagnosis not present

## 2021-03-20 DIAGNOSIS — F1190 Opioid use, unspecified, uncomplicated: Secondary | ICD-10-CM

## 2021-03-20 DIAGNOSIS — R634 Abnormal weight loss: Secondary | ICD-10-CM | POA: Diagnosis not present

## 2021-03-20 MED ORDER — PROMETHAZINE HCL 25 MG PO TABS: 25.0000 mg | ORAL_TABLET | Freq: Three times a day (TID) | ORAL | 2 refills | Status: DC | PRN

## 2021-03-20 MED ORDER — MEGESTROL ACETATE 40 MG PO TABS: 40.0000 mg | ORAL_TABLET | Freq: Every day | ORAL | 3 refills | Status: AC

## 2021-03-20 NOTE — Progress Notes (Addendum)
Bradley Espinoza, M.D. Gastroenterology & Hepatology [facility] [address] [place], [place] [address]  Primary Care Physician: Celene Squibb, MD [place] [id number]  I will communicate my assessment and recommendations to the referring MD via EMR.  Problems: Chronic nausea and weight loss. Chronic opiate intake   History of Present Illness: Bradley Espinoza is a 65 y.o. male with past medical history of anxiety, depression, GERD, chronic opiate use due to vertebral fracture, who presents for evaluation of nausea and weight loss.   The patient was last seen on 12/15/2020. At that time he was started on low dose Remeron and Bentyl as needed for abdominal pain. He was ordered to have an abdominal US but he did not perform this test.  He states he took the mirtazapine every night for the last 3 months without any improvement. Also reports he felt that mirtazapine caused some lightheadedness and sleepiness and could not tolerate it. Has not taken it in the last 3 days.  He feels nausea frequently but denies any vomiting. He does not feel any improvement with the intake of Zofran. States that he "feels constantly sick, like the whole abdomen is sick" but denies any abdominal pain. States that sometimes he wakes up during the ngith just "feeling sick". States that due to this sensation he has not been eating well as he "has to force himself to eat food, it is just bad nausea".  Has lost close to 35 lb in the last 2 years unintentionally. He is taking metoclopramide 5 mg every 6 hours without any improvement. He was taking the Reglan as he thought he was taking it to improve his BM frequency.  Has taken Percocet 3-4 times a day. Has taken this for chronic back pain.  He has a BM every day.   Most recent CT of the abdomen pelvis with IV contrast was performed on 04/19/2020 which showed large stool burden but no other  alterations.   Last EGD: 06/05/2019, normal esophagus, had gastritis with biopsies negative for H. pylori, normal duodenum.   Last Colonoscopy: 06/05/2019 small polyp was found in the cecum which was resected, 2 small polyps were found in the distal sigmoid and ascending colon which were resected, diverticulosis and external hemorrhoids.  All polyps were tubular adenomas    Past Medical History: Past Medical History:  Diagnosis Date   Anxiety    Arthritis    low back pain, lumbar radiculopathy   Depression    Fatty liver    Fracture    B/L ankles   GERD (gastroesophageal reflux disease)    Headache(784.0)    allergy related    History of kidney stones    passed stones   History of stomach ulcers    Retained orthopedic hardware    failed retained hardware right foot   Wears glasses    Wears glasses    Wears partial dentures    upper    Past Surgical History: Past Surgical History:  Procedure Laterality Date   AMPUTATION Right 01/29/2020   Procedure: RIGHT BELOW KNEE AMPUTATION;  Surgeon: Newt Minion, MD;  Location: [place];  Service: Orthopedics;  Laterality: Right;   ANKLE FUSION Right 05/11/2015   Procedure: Right Posterior Arthroscopic Subtalar Arthrodesis;  Surgeon: Newt Minion, MD;  Location: [place];  Service: Orthopedics;  Laterality: Right;   ANKLE FUSION Right 11/27/2017   Procedure: RIGHT TIBIOCALCANEAL FUSION;  Surgeon: Meridee Score  V, MD;  Location: [place];  Service: Orthopedics;  Laterality: Right;   ANKLE FUSION Right 11/26/2018   Procedure: RIGHT ANTERIOR ANKLE FUSION, APPLY VAC;  Surgeon: Newt Minion, MD;  Location: [place];  Service: Orthopedics;  Laterality: Right;   ANKLE FUSION Right 09/09/2019   Procedure: REVISION RIGHT ANKLE FUSION;  Surgeon: Newt Minion, MD;  Location: [place];  Service: Orthopedics;  Laterality: Right;   ANTERIOR LAT LUMBAR FUSION N/A 04/05/2017   Procedure: Extreme Lateral Interbody Fusion - Lumbar three-lumbar four ,exploration of  fusion Posterior augmentation with globus addition Removal hardware Lumbar one-three. Lumbar four-sacral one,  Removal internal bone growth stimulator;  Surgeon: Kary Kos, MD;  Location: [place];  Service: Neurosurgery;  Laterality: N/A;   APPLICATION OF WOUND VAC  12/23/2018   Procedure: Application Of Wound Vac;  Surgeon: Newt Minion, MD;  Location: [place];  Service: Orthopedics;;   BACK SURGERY  2004   x 2   BIOPSY  06/05/2019   Procedure: BIOPSY;  Surgeon: Rogene Houston, MD;  [facility];  Service: Endoscopy;;  gastric biopsy   CERVICAL SPINE SURGERY  2008   CHOLECYSTECTOMY N/A 07/06/2019   Procedure: LAPAROSCOPIC CHOLECYSTECTOMY;  Surgeon: Virl Cagey, MD;  [facility];  Service: General;  Laterality: N/A;   COLONOSCOPY WITH PROPOFOL N/A 06/05/2019   Procedure: COLONOSCOPY WITH PROPOFOL;  Surgeon: Rogene Houston, MD;  [facility];  Service: Endoscopy;  Laterality: N/A;   ESOPHAGOGASTRODUODENOSCOPY     ESOPHAGOGASTRODUODENOSCOPY (EGD) WITH PROPOFOL N/A 06/05/2019   Procedure: ESOPHAGOGASTRODUODENOSCOPY (EGD) WITH PROPOFOL;  Surgeon: Rogene Houston, MD;  [facility];  Service: Endoscopy;  Laterality: N/A;  1220pm   HARDWARE REMOVAL Right 10/09/2014   Procedure: Removal Deep Hardware, Irrigation and Debridement Calcaneus, Place Antibiotic Beads and Wound VAC ;  Surgeon: Newt Minion, MD;  Location: [place];  Service: Orthopedics;  Laterality: Right;   HARDWARE REMOVAL Right 08/12/2015   Procedure: Removal Deep Hardware Right Foot;  Surgeon: Newt Minion, MD;  Location: [place];  Service: Orthopedics;  Laterality: Right;   HARDWARE REMOVAL Right 11/26/2018   Procedure: REMOVAL RIGHT TIBIOCALCANEAL NAIL;  Surgeon: Newt Minion, MD;  Location: [place];  Service: Orthopedics;  Laterality: Right;   HARDWARE REMOVAL Right 12/23/2018   Procedure: RIGHT ANKLE REMOVE HARDWARE, PLACE ANTIBIOTIC BEADS and placement of wound vac;  Surgeon: Newt Minion, MD;   Location: [place];  Service: Orthopedics;  Laterality: Right;   HARDWARE REMOVAL Right 09/29/2019   Procedure: RIGHT ANKLE REMOVE HARDWARE, DEBRIDEMENT;  Surgeon: Newt Minion, MD;  Location: [place];  Service: Orthopedics;  Laterality: Right;   HARDWARE REMOVAL N/A 01/09/2021   Procedure: Removal of hardware upper thoracic screws;  Surgeon: Kary Kos, MD;  Location: [place];  Service: Neurosurgery;  Laterality: N/A;   I & D EXTREMITY Right 09/15/2014   Procedure: IRRIGATION AND DEBRIDEMENT Ankle;  Surgeon: Renette Butters, MD;  Location: [place];  Service: Orthopedics;  Laterality: Right;   INGUINAL HERNIA REPAIR Bilateral    LAMINECTOMY WITH POSTERIOR LATERAL ARTHRODESIS LEVEL 4 N/A 09/10/2018   Procedure: Posterior Lateral Fusion - Thoracic Eleven-Thoracic Twelve - Thoracic Twelve-Lumbar One - Lumbar One-Lumbar Two - Lumbar Two-Lumbar Three with instrumentaion and PLA;  Surgeon: Kary Kos, MD;  Location: [place];  Service: Neurosurgery;  Laterality: N/A;  Posterior Lateral Fusion - Thoracic Eleven-Thoracic Twelve - Thoracic Twelve-Lumbar One - Lumbar One-Lumbar Two - Lumbar Two-Lumbar Thre  LIVER BIOPSY N/A 07/06/2019   Procedure: LIVER BIOPSY;  Surgeon: Virl Cagey, MD;  Location: [facility];  Service: General;  Laterality: N/A;   LUMBAR PERCUTANEOUS PEDICLE SCREW 1 LEVEL N/A 04/05/2017   Procedure: LUMBAR PERCUTANEOUS PEDICLE SCREW LUMBAR THREE-FOUR;  Surgeon: Kary Kos, MD;  Location: [place];  Service: Neurosurgery;  Laterality: N/A;   ORIF CALCANEOUS FRACTURE Right 09/19/2014   Procedure: OPEN REDUCTION INTERNAL FIXATION (ORIF) CALCANEOUS FRACTURE;  Surgeon: Newt Minion, MD;  Location: [place];  Service: Orthopedics;  Laterality: Right;   ORIF CALCANEOUS FRACTURE Left 09/19/2014   Procedure: OPEN REDUCTION INTERNAL FIXATION (ORIF) CALCANEOUS FRACTURE;  Surgeon: Newt Minion, MD;  Location: [place];  Service: Orthopedics;  Laterality: Left;   POLYPECTOMY  06/05/2019   Procedure: POLYPECTOMY;   Surgeon: Rogene Houston, MD;  [facility];  Service: Endoscopy;;  cecal polyp biopsy forcep, ascending polyp cold snare    Family History: Family History  Problem Relation Age of Onset   Diabetes Father    Cancer Other     Social History: Social History   Tobacco Use  Smoking Status Former   Packs/day: 1.00   Years: 10.00   Pack years: 10.00   Types: Cigars, Cigarettes   Start date: 10/08/1976   Quit date: 09/2017   Years since quitting: 3.5  Smokeless Tobacco Never  Tobacco Comments   quit smoking cegar 09/10/2018   Social History   Substance and Sexual Activity  Alcohol Use No   Comment:  quit 1998   Social History   Substance and Sexual Activity  Drug Use No    Allergies: Allergies  Allergen Reactions   Doxycycline Nausea Only   Levofloxacin Other (See Comments)    Blister in mouth   Codeine Nausea And Vomiting    Medications: Current Outpatient Medications  Medication Sig Dispense Refill   albuterol (VENTOLIN HFA) 108 (90 Base) MCG/ACT inhaler Inhale 2 puffs into the lungs every 4 (four) hours as needed for wheezing or shortness of breath.     dicyclomine (BENTYL) 10 MG capsule Take 1 capsule (10 mg total) by mouth every 12 (twelve) hours as needed for spasms. 60 capsule 2   docusate sodium (COLACE) 100 MG capsule Take 100 mg by mouth daily as needed for mild constipation.      gabapentin (NEURONTIN) 300 MG capsule Take 300 mg by mouth 2 (two) times daily.     metoCLOPramide (REGLAN) 5 MG tablet Take 1 tablet (5 mg total) by mouth 4 (four) times daily -  before meals and at bedtime. 360 tablet 2   ondansetron (ZOFRAN) 4 MG tablet TAKE (1) TABLET THREE TIMES DAILY AS NEEDED FOR NAUSEA & VOMITING. 90 tablet 3   oxyCODONE-acetaminophen (PERCOCET) 10-325 MG tablet Take 1 tablet by mouth every 4 (four) hours as needed for pain. (Patient taking differently: Take 1 tablet by mouth every 6 (six) hours as needed for pain.) 30 tablet 0   tamsulosin  (FLOMAX) 0.4 MG CAPS capsule Take 0.4 mg by mouth in the morning and at bedtime.     testosterone cypionate (DEPOTESTOSTERONE CYPIONATE) 200 MG/ML injection Inject 1 mL (200 mg total) into the muscle every 14 (fourteen) days. 6 mL 3   mirtazapine (REMERON) 7.5 MG tablet Take 1 tablet (7.5 mg total) by mouth at bedtime. (Patient not taking: Reported on 03/20/2021) 90 tablet 1   No current facility-administered medications for this visit.   Facility-Administered Medications Ordered in Other Visits  Medication Dose Route  Frequency Provider Last Rate Last Admin   0.45 % sodium chloride infusion   Intravenous Continuous Rayburn, Neta Mends, PA-C       docusate sodium (COLACE) capsule 100 mg  100 mg Oral BID Rayburn, Neta Mends, PA-C       oxyCODONE (Oxy IR/ROXICODONE) immediate release tablet 5-10 mg  5-10 mg Oral Q3H PRN Rayburn, Neta Mends, PA-C       senna (SENOKOT) tablet 8.6 mg  1 tablet Oral BID Rayburn, Shawn Montgomery, PA-C       sorbitol 70 % solution 30 mL  30 mL Oral Daily PRN Rayburn, Neta Mends, PA-C        Review of Systems: GENERAL: negative for malaise, night sweats HEENT: No changes in hearing or vision, no nose bleeds or other nasal problems. NECK: Negative for lumps, goiter, pain and significant neck swelling RESPIRATORY: Negative for cough, wheezing CARDIOVASCULAR: Negative for chest pain, leg swelling, palpitations, orthopnea GI: SEE HPI MUSCULOSKELETAL: Negative for joint pain or swelling, back pain, and muscle pain. SKIN: Negative for lesions, rash PSYCH: Negative for sleep disturbance, mood disorder and recent psychosocial stressors. HEMATOLOGY Negative for prolonged bleeding, bruising easily, and swollen nodes. ENDOCRINE: Negative for cold or heat intolerance, polyuria, polydipsia and goiter. NEURO: negative for tremor, gait imbalance, syncope and seizures. The remainder of the review of systems is noncontributory.   Physical Exam: BP (!)  167/86 (BP Location: Right Arm, Patient Position: Sitting, Cuff Size: Small)   Pulse 70   Temp 98.5 F (36.9 C) (Oral)   Ht 6' (1.829 m)   Wt 145 lb (65.8 kg)   BMI 19.67 kg/m  GENERAL: The patient is AO x3, in no acute distress. Underweight. HEENT: Head is normocephalic and atraumatic. EOMI are intact. Mouth is well hydrated and without lesions. NECK: Supple. No masses LUNGS: Clear to auscultation. No presence of rhonchi/wheezing/rales. Adequate chest expansion HEART: RRR, normal s1 and s2. ABDOMEN: Soft, nontender, no guarding, no peritoneal signs, and nondistended. BS +. No masses. EXTREMITIES: Without any cyanosis, clubbing, rash, lesions or edema. NEUROLOGIC: AOx3, no focal motor deficit. SKIN: no jaundice, no rashes  Imaging/Labs: as above  I personally reviewed and interpreted the available labs, imaging and endoscopic files.  Impression and Plan: Bradley Espinoza is a 65 y.o. male with past medical history of anxiety, depression, GERD, chronic opiate use due to vertebral fracture, who presents for evaluation of persistent nausea and weight loss. The patient has presented chronic symptoms with negative imaging and endoscopic evaluations since the symptoms started. He is currently taking high dose Percocet frequently, which I suspect is the main reason for his symptoms as it can lead to delayed gastric emptying and significant bowel hypersensitivity. I had a detailed discussion with the patient about this. Ideally, he should come off the opiates but he is not interested in this given the severity of his pain. He may benefit from an appetite stimulant such as megestrol , which will  be prescribed today. As he has not presented any improvement with Reglan and mirtazapine, these should be stopped.  He will brenefit from using protein shakes to maintain his weight. I will prescribe Phenergan to improve his nausea. Will consider repeating an EGD before discussing a referral to a tertiary  center.  -Start megestrol 40 mg qday -Stop Reglan and mirtazapine - Will consider checking cortisol in next appointment if symptoms persist - If possible, take Ensure or protein shakes three times a day  All questions were answered.  Harvel Quale, MD Gastroenterology and Hepatology [facility]

## 2021-03-20 NOTE — Patient Instructions (Addendum)
Start megestrol 40 mg qday Stop Reglan and mirtazapine

## 2021-03-20 NOTE — Telephone Encounter (Signed)
Approved for Monovisc, left knee. [place] Patient will be responsible for 20% OOP. Co-pay of $30.00 No PA required  Appt.03/21/2021 with Dr. Sharol Given

## 2021-03-21 ENCOUNTER — Ambulatory Visit: Payer: PPO | Admitting: Orthopedic Surgery

## 2021-03-21 DIAGNOSIS — M1712 Unilateral primary osteoarthritis, left knee: Secondary | ICD-10-CM | POA: Diagnosis not present

## 2021-03-21 DIAGNOSIS — Z89511 Acquired absence of right leg below knee: Secondary | ICD-10-CM

## 2021-03-31 ENCOUNTER — Encounter: Payer: Self-pay | Admitting: Orthopedic Surgery

## 2021-03-31 DIAGNOSIS — Z89511 Acquired absence of right leg below knee: Secondary | ICD-10-CM | POA: Diagnosis not present

## 2021-03-31 DIAGNOSIS — M1712 Unilateral primary osteoarthritis, left knee: Secondary | ICD-10-CM

## 2021-03-31 MED ORDER — LIDOCAINE HCL (PF) 1 % IJ SOLN
2.0000 mL | INTRAMUSCULAR | Status: AC | PRN
  Administered 2021-03-31: 2 mL

## 2021-03-31 MED ORDER — HYALURONAN 88 MG/4ML IX SOSY
88.0000 mg | PREFILLED_SYRINGE | INTRA_ARTICULAR | Status: AC | PRN
  Administered 2021-03-31: 88 mg via INTRA_ARTICULAR

## 2021-03-31 NOTE — Progress Notes (Signed)
Office Visit Note   Patient: Bradley Espinoza           Date of Birth: 04-06-1956           MRN: [id number] Visit Date: 03/21/2021              Requested by: Celene Squibb, MD [place],  [place] [address] PCP: Celene Squibb, MD  Chief Complaint  Patient presents with   Left Knee - Follow-up      HPI: Patient is a 65 year old gentleman who presents for 3 separate issues.  Patient has osteoarthritis of his left knee and presents for a hyaluronic acid injection.  Patient has had good relief in the past.  Patient also states he has a abdominal hernia.  Patient has been subsiding into the socket of his right residual limb.  Assessment & Plan: Visit Diagnoses:  1. Acquired absence of right leg below knee [facility])     Plan: Recommended patient follow-up with general surgery for evaluation of his hernia.  Left knee was injected without complications.  Patient was provided a prescription for biotech for a new socket material liner and supplies.  Follow-Up Instructions: Return if symptoms worsen or fail to improve.   Ortho Exam  Patient is alert, oriented, no adenopathy, well-dressed, normal affect, normal respiratory effort. Examination of patient's left residual limb he has ulcers in the popliteal fossa from subsiding into the socket.  He has no rotational stability his residual limb has lost volume and he has a loose fitting prosthesis.  Imaging: No results found. No images are attached to the encounter.  Labs: Lab Results  Component Value Date   HGBA1C 5.1 06/26/2016   ESRSEDRATE 9 12/08/2018   REPTSTATUS 10/04/2019 FINAL 09/29/2019   GRAMSTAIN  09/29/2019    ABUNDANT WBC PRESENT, PREDOMINANTLY PMN FEW GRAM POSITIVE COCCI    CULT  09/29/2019    FEW STAPHYLOCOCCUS AUREUS NO ANAEROBES ISOLATED Performed at [facility], [place] [address] [place], [place] [address]    LABORGA STAPHYLOCOCCUS AUREUS 09/29/2019     Lab Results  Component Value Date    ALBUMIN 4.4 07/02/2019   ALBUMIN 4.3 08/12/2015   ALBUMIN 4.1 05/06/2015    No results found for: MG No results found for: VD25OH  No results found for: PREALBUMIN CBC EXTENDED Latest Ref Rng & Units 01/09/2021 01/29/2020 09/29/2019  WBC 4.0 - 10.5 K/uL 7.6 6.7 6.8  RBC 4.22 - 5.81 MIL/uL 5.44 5.34 4.67  HGB 13.0 - 17.0 g/dL 15.9 15.2 13.1  HCT 39.0 - 52.0 % 49.1 48.1 42.0  PLT 150 - 400 K/uL 206 221 305  NEUTROABS 1.7 - 7.7 K/uL - - -  LYMPHSABS 0.7 - 4.0 K/uL - - -     There is no height or weight on file to calculate BMI.  Orders:  No orders of the defined types were placed in this encounter.  No orders of the defined types were placed in this encounter.    Procedures: Large Joint Inj: L knee on 03/31/2021 1:34 PM Indications: pain and diagnostic evaluation Details: 22 G 1.5 in needle, anteromedial approach  Arthrogram: No  Medications: 2 mL lidocaine (PF) 1 %; 88 mg Hyaluronan 88 MG/4ML Outcome: tolerated well, no immediate complications Procedure, treatment alternatives, risks and benefits explained, specific risks discussed. Consent was given by the patient. Immediately prior to procedure a time out was called to verify the correct patient, procedure, equipment, support staff and site/side marked  as required. Patient was prepped and draped in the usual sterile fashion.     Clinical Data: No additional findings.  ROS:  All other systems negative, except as noted in the HPI. Review of Systems  Objective: Vital Signs: There were no vitals taken for this visit.  Specialty Comments:  No specialty comments available.  PMFS History: Patient Active Problem List   Diagnosis Date Noted   Painful orthopaedic hardware ([place]) 01/09/2021   Abdominal pain, left lower quadrant 12/15/2020   Loss of weight 12/15/2020   Chronic, continuous use of opioids 12/15/2020   Acute hematogenous osteomyelitis of right foot ([place]) 01/29/2020   Post-traumatic arthritis of ankle, right     Cutaneous abscess of right ankle    Subacute osteomyelitis of right tibia ([place])    Fatty liver 06/30/2019   Calculus of gallbladder without cholecystitis without obstruction 06/30/2019   Nausea without vomiting 05/13/2019   Abdominal bloating 05/13/2019   Wound infection following procedure 12/19/2018   Malunion of joint fusion ([place]) 11/26/2018   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[place])    Status post lumbar spinal fusion 09/10/2018   S/P ankle fusion 11/27/2017   Nonunion of subtalar arthrodesis    Avascular necrosis of talus, right ([facility])    Post-traumatic osteoarthritis, left ankle and foot 10/22/2017   DDD (degenerative disc disease), lumbar 04/05/2017   Fracture of L2 vertebra ([place]) 01/12/2015   Acute osteomyelitis of calcaneum ([place]) 10/09/2014   Dysuria 10/05/2014   Cellulitis 10/05/2014   Fall from ladder 09/21/2014   L2 vertebral fracture ([place]) 09/21/2014   Bilateral calcaneal fractures 09/21/2014   Chronic pain 09/21/2014   Acute blood loss anemia 09/21/2014   Open fracture of both calcanei 09/15/2014   Past Medical History:  Diagnosis Date   Anxiety    Arthritis    low back pain, lumbar radiculopathy   Depression    Fatty liver    Fracture    B/L ankles   GERD (gastroesophageal reflux disease)    Headache(784.0)    allergy related    History of kidney stones    passed stones   History of stomach ulcers    Retained orthopedic hardware    failed retained hardware right foot   Wears glasses    Wears glasses    Wears partial dentures    upper    Family History  Problem Relation Age of Onset   Diabetes Father    Cancer Other     Past Surgical History:  Procedure Laterality Date   AMPUTATION Right 01/29/2020   Procedure: RIGHT BELOW KNEE AMPUTATION;  Surgeon: Newt Minion, MD;  Location: [place];  Service: Orthopedics;  Laterality: Right;   ANKLE FUSION Right 05/11/2015   Procedure: Right Posterior Arthroscopic Subtalar Arthrodesis;  Surgeon: Newt Minion, MD;   Location: [place];  Service: Orthopedics;  Laterality: Right;   ANKLE FUSION Right 11/27/2017   Procedure: RIGHT TIBIOCALCANEAL FUSION;  Surgeon: Newt Minion, MD;  Location: [place];  Service: Orthopedics;  Laterality: Right;   ANKLE FUSION Right 11/26/2018   Procedure: RIGHT ANTERIOR ANKLE FUSION, APPLY VAC;  Surgeon: Newt Minion, MD;  Location: [place];  Service: Orthopedics;  Laterality: Right;   ANKLE FUSION Right 09/09/2019   Procedure: REVISION RIGHT ANKLE FUSION;  Surgeon: Newt Minion, MD;  Location: [place];  Service: Orthopedics;  Laterality: Right;   ANTERIOR LAT LUMBAR FUSION N/A 04/05/2017   Procedure: Extreme Lateral Interbody Fusion - Lumbar three-lumbar four ,exploration of fusion Posterior augmentation with  globus addition Removal hardware Lumbar one-three. Lumbar four-sacral one,  Removal internal bone growth stimulator;  Surgeon: Kary Kos, MD;  Location: [place];  Service: Neurosurgery;  Laterality: N/A;   APPLICATION OF WOUND VAC  12/23/2018   Procedure: Application Of Wound Vac;  Surgeon: Newt Minion, MD;  Location: [place];  Service: Orthopedics;;   BACK SURGERY  2004   x 2   BIOPSY  06/05/2019   Procedure: BIOPSY;  Surgeon: Rogene Houston, MD;  [facility];  Service: Endoscopy;;  gastric biopsy   CERVICAL SPINE SURGERY  2008   CHOLECYSTECTOMY N/A 07/06/2019   Procedure: LAPAROSCOPIC CHOLECYSTECTOMY;  Surgeon: Virl Cagey, MD;  [facility];  Service: General;  Laterality: N/A;   COLONOSCOPY WITH PROPOFOL N/A 06/05/2019   Procedure: COLONOSCOPY WITH PROPOFOL;  Surgeon: Rogene Houston, MD;  [facility];  Service: Endoscopy;  Laterality: N/A;   ESOPHAGOGASTRODUODENOSCOPY     ESOPHAGOGASTRODUODENOSCOPY (EGD) WITH PROPOFOL N/A 06/05/2019   Procedure: ESOPHAGOGASTRODUODENOSCOPY (EGD) WITH PROPOFOL;  Surgeon: Rogene Houston, MD;  [facility];  Service: Endoscopy;  Laterality: N/A;  1220pm   HARDWARE REMOVAL Right 10/09/2014    Procedure: Removal Deep Hardware, Irrigation and Debridement Calcaneus, Place Antibiotic Beads and Wound VAC ;  Surgeon: Newt Minion, MD;  Location: [place];  Service: Orthopedics;  Laterality: Right;   HARDWARE REMOVAL Right 08/12/2015   Procedure: Removal Deep Hardware Right Foot;  Surgeon: Newt Minion, MD;  Location: [place];  Service: Orthopedics;  Laterality: Right;   HARDWARE REMOVAL Right 11/26/2018   Procedure: REMOVAL RIGHT TIBIOCALCANEAL NAIL;  Surgeon: Newt Minion, MD;  Location: [place];  Service: Orthopedics;  Laterality: Right;   HARDWARE REMOVAL Right 12/23/2018   Procedure: RIGHT ANKLE REMOVE HARDWARE, PLACE ANTIBIOTIC BEADS and placement of wound vac;  Surgeon: Newt Minion, MD;  Location: [place];  Service: Orthopedics;  Laterality: Right;   HARDWARE REMOVAL Right 09/29/2019   Procedure: RIGHT ANKLE REMOVE HARDWARE, DEBRIDEMENT;  Surgeon: Newt Minion, MD;  Location: [place];  Service: Orthopedics;  Laterality: Right;   HARDWARE REMOVAL N/A 01/09/2021   Procedure: Removal of hardware upper thoracic screws;  Surgeon: Kary Kos, MD;  Location: [place];  Service: Neurosurgery;  Laterality: N/A;   I & D EXTREMITY Right 09/15/2014   Procedure: IRRIGATION AND DEBRIDEMENT Ankle;  Surgeon: Renette Butters, MD;  Location: [place];  Service: Orthopedics;  Laterality: Right;   INGUINAL HERNIA REPAIR Bilateral    LAMINECTOMY WITH POSTERIOR LATERAL ARTHRODESIS LEVEL 4 N/A 09/10/2018   Procedure: Posterior Lateral Fusion - Thoracic Eleven-Thoracic Twelve - Thoracic Twelve-Lumbar One - Lumbar One-Lumbar Two - Lumbar Two-Lumbar Three with instrumentaion and PLA;  Surgeon: Kary Kos, MD;  Location: [place];  Service: Neurosurgery;  Laterality: N/A;  Posterior Lateral Fusion - Thoracic Eleven-Thoracic Twelve - Thoracic Twelve-Lumbar One - Lumbar One-Lumbar Two - Lumbar Two-Lumbar Thre   LIVER BIOPSY N/A 07/06/2019   Procedure: LIVER BIOPSY;  Surgeon: Virl Cagey, MD;  [facility];  Service:  General;  Laterality: N/A;   LUMBAR PERCUTANEOUS PEDICLE SCREW 1 LEVEL N/A 04/05/2017   Procedure: LUMBAR PERCUTANEOUS PEDICLE SCREW LUMBAR THREE-FOUR;  Surgeon: Kary Kos, MD;  Location: [place];  Service: Neurosurgery;  Laterality: N/A;   ORIF CALCANEOUS FRACTURE Right 09/19/2014   Procedure: OPEN REDUCTION INTERNAL FIXATION (ORIF) CALCANEOUS FRACTURE;  Surgeon: Newt Minion, MD;  Location: [place];  Service: Orthopedics;  Laterality: Right;   ORIF  CALCANEOUS FRACTURE Left 09/19/2014   Procedure: OPEN REDUCTION INTERNAL FIXATION (ORIF) CALCANEOUS FRACTURE;  Surgeon: Newt Minion, MD;  Location: [place];  Service: Orthopedics;  Laterality: Left;   POLYPECTOMY  06/05/2019   Procedure: POLYPECTOMY;  Surgeon: Rogene Houston, MD;  [facility];  Service: Endoscopy;;  cecal polyp biopsy forcep, ascending polyp cold snare   Social History   Occupational History    Comment: disabled  Tobacco Use   Smoking status: Former    Packs/day: 1.00    Years: 10.00    Pack years: 10.00    Types: Cigars, Cigarettes    Start date: 10/08/1976    Quit date: 09/2017    Years since quitting: 3.5   Smokeless tobacco: Never   Tobacco comments:    quit smoking cegar 09/10/2018  Vaping Use   Vaping Use: Never used  Substance and Sexual Activity   Alcohol use: No    Comment:  quit 1998   Drug use: No   Sexual activity: Yes    Birth control/protection: None

## 2021-04-04 DIAGNOSIS — K4090 Unilateral inguinal hernia, without obstruction or gangrene, not specified as recurrent: Secondary | ICD-10-CM | POA: Diagnosis not present

## 2021-04-05 DIAGNOSIS — M47812 Spondylosis without myelopathy or radiculopathy, cervical region: Secondary | ICD-10-CM | POA: Diagnosis not present

## 2021-04-18 ENCOUNTER — Other Ambulatory Visit: Payer: Self-pay

## 2021-04-18 ENCOUNTER — Ambulatory Visit: Payer: PPO | Admitting: General Surgery

## 2021-04-18 ENCOUNTER — Encounter: Payer: Self-pay | Admitting: General Surgery

## 2021-04-18 VITALS — BP 162/79 | HR 90 | Temp 98.9°F | Resp 16 | Ht 72.0 in | Wt 146.0 lb

## 2021-04-18 DIAGNOSIS — K4090 Unilateral inguinal hernia, without obstruction or gangrene, not specified as recurrent: Secondary | ICD-10-CM | POA: Diagnosis not present

## 2021-04-19 ENCOUNTER — Encounter (HOSPITAL_COMMUNITY): Payer: Self-pay | Admitting: Physical Therapy

## 2021-04-19 ENCOUNTER — Ambulatory Visit (HOSPITAL_COMMUNITY): Payer: PPO | Attending: Neurosurgery | Admitting: Physical Therapy

## 2021-04-19 DIAGNOSIS — M6281 Muscle weakness (generalized): Secondary | ICD-10-CM | POA: Insufficient documentation

## 2021-04-19 DIAGNOSIS — R262 Difficulty in walking, not elsewhere classified: Secondary | ICD-10-CM | POA: Insufficient documentation

## 2021-04-19 DIAGNOSIS — M545 Low back pain, unspecified: Secondary | ICD-10-CM | POA: Diagnosis not present

## 2021-04-19 NOTE — Therapy (Addendum)
[facility] [address] [place], [place], [address] Phone: [phone]   Fax:  [phone]  Physical Therapy Evaluation and Discharge Note  Patient Details  Name: Bradley Espinoza MRN: [id number] Date of Birth: May 17, 1956 Referring Provider (PT): Kary Kos MD  PHYSICAL THERAPY DISCHARGE SUMMARY  Visits from Start of Care: 1  Current functional level related to goals / functional outcomes: Unable to assess due to unplanned discharge   Remaining deficits: Unable to assess due to unplanned discharge   Education / Equipment: Unable to assess due to unplanned discharge   Patient agrees to discharge. Patient goals were not met. Patient is being discharged due to not returning since the last visit.  2:55 PM, 09/27/21 Jerene Pitch, DPT Physical Therapy with [facility]  [phone] office      Encounter Date: 04/19/2021   PT End of Session - 04/19/21 0853     Visit Number 1    Number of Visits 12    Date for PT Re-Evaluation 05/31/21    Authorization Type [employer] no VL, no auth    PT Start Time 0915    PT Stop Time 0955    PT Time Calculation (min) 40 min    Activity Tolerance Patient limited by pain             Past Medical History:  Diagnosis Date   Anxiety    Arthritis    low back pain, lumbar radiculopathy   Depression    Fatty liver    Fracture    B/L ankles   GERD (gastroesophageal reflux disease)    Headache(784.0)    allergy related    History of kidney stones    passed stones   History of stomach ulcers    Retained orthopedic hardware    failed retained hardware right foot   Wears glasses    Wears glasses    Wears partial dentures    upper    Past Surgical History:  Procedure Laterality Date   AMPUTATION Right 01/29/2020   Procedure: RIGHT BELOW KNEE AMPUTATION;  Surgeon: Newt Minion, MD;  Location: [place];  Service: Orthopedics;  Laterality: Right;    ANKLE FUSION Right 05/11/2015   Procedure: Right Posterior Arthroscopic Subtalar Arthrodesis;  Surgeon: Newt Minion, MD;  Location: [place];  Service: Orthopedics;  Laterality: Right;   ANKLE FUSION Right 11/27/2017   Procedure: RIGHT TIBIOCALCANEAL FUSION;  Surgeon: Newt Minion, MD;  Location: [place];  Service: Orthopedics;  Laterality: Right;   ANKLE FUSION Right 11/26/2018   Procedure: RIGHT ANTERIOR ANKLE FUSION, APPLY VAC;  Surgeon: Newt Minion, MD;  Location: [place];  Service: Orthopedics;  Laterality: Right;   ANKLE FUSION Right 09/09/2019   Procedure: REVISION RIGHT ANKLE FUSION;  Surgeon: Newt Minion, MD;  Location: [place];  Service: Orthopedics;  Laterality: Right;   ANTERIOR LAT LUMBAR FUSION N/A 04/05/2017   Procedure: Extreme Lateral Interbody Fusion - Lumbar three-lumbar four ,exploration of fusion Posterior augmentation with globus addition Removal hardware Lumbar one-three. Lumbar four-sacral one,  Removal internal bone growth stimulator;  Surgeon: Kary Kos, MD;  Location: [place];  Service: Neurosurgery;  Laterality: N/A;   APPLICATION OF WOUND VAC  12/23/2018   Procedure: Application Of Wound Vac;  Surgeon: Newt Minion, MD;  Location: [place];  Service: Orthopedics;;   BACK SURGERY  2004   x 2   BIOPSY  06/05/2019   Procedure: BIOPSY;  Surgeon: Laural Golden,  Mechele Dawley, MD;  [facility];  Service: Endoscopy;;  gastric biopsy   CERVICAL SPINE SURGERY  2008   CHOLECYSTECTOMY N/A 07/06/2019   Procedure: LAPAROSCOPIC CHOLECYSTECTOMY;  Surgeon: Virl Cagey, MD;  [facility];  Service: General;  Laterality: N/A;   COLONOSCOPY WITH PROPOFOL N/A 06/05/2019   Procedure: COLONOSCOPY WITH PROPOFOL;  Surgeon: Rogene Houston, MD;  [facility];  Service: Endoscopy;  Laterality: N/A;   ESOPHAGOGASTRODUODENOSCOPY     ESOPHAGOGASTRODUODENOSCOPY (EGD) WITH PROPOFOL N/A 06/05/2019   Procedure: ESOPHAGOGASTRODUODENOSCOPY (EGD) WITH PROPOFOL;  Surgeon: Rogene Houston,  MD;  [facility];  Service: Endoscopy;  Laterality: N/A;  1220pm   HARDWARE REMOVAL Right 10/09/2014   Procedure: Removal Deep Hardware, Irrigation and Debridement Calcaneus, Place Antibiotic Beads and Wound VAC ;  Surgeon: Newt Minion, MD;  Location: [place];  Service: Orthopedics;  Laterality: Right;   HARDWARE REMOVAL Right 08/12/2015   Procedure: Removal Deep Hardware Right Foot;  Surgeon: Newt Minion, MD;  Location: [place];  Service: Orthopedics;  Laterality: Right;   HARDWARE REMOVAL Right 11/26/2018   Procedure: REMOVAL RIGHT TIBIOCALCANEAL NAIL;  Surgeon: Newt Minion, MD;  Location: [place];  Service: Orthopedics;  Laterality: Right;   HARDWARE REMOVAL Right 12/23/2018   Procedure: RIGHT ANKLE REMOVE HARDWARE, PLACE ANTIBIOTIC BEADS and placement of wound vac;  Surgeon: Newt Minion, MD;  Location: [place];  Service: Orthopedics;  Laterality: Right;   HARDWARE REMOVAL Right 09/29/2019   Procedure: RIGHT ANKLE REMOVE HARDWARE, DEBRIDEMENT;  Surgeon: Newt Minion, MD;  Location: [place];  Service: Orthopedics;  Laterality: Right;   HARDWARE REMOVAL N/A 01/09/2021   Procedure: Removal of hardware upper thoracic screws;  Surgeon: Kary Kos, MD;  Location: [place];  Service: Neurosurgery;  Laterality: N/A;   I & D EXTREMITY Right 09/15/2014   Procedure: IRRIGATION AND DEBRIDEMENT Ankle;  Surgeon: Renette Butters, MD;  Location: [place];  Service: Orthopedics;  Laterality: Right;   INGUINAL HERNIA REPAIR Bilateral    LAMINECTOMY WITH POSTERIOR LATERAL ARTHRODESIS LEVEL 4 N/A 09/10/2018   Procedure: Posterior Lateral Fusion - Thoracic Eleven-Thoracic Twelve - Thoracic Twelve-Lumbar One - Lumbar One-Lumbar Two - Lumbar Two-Lumbar Three with instrumentaion and PLA;  Surgeon: Kary Kos, MD;  Location: [place];  Service: Neurosurgery;  Laterality: N/A;  Posterior Lateral Fusion - Thoracic Eleven-Thoracic Twelve - Thoracic Twelve-Lumbar One - Lumbar One-Lumbar Two - Lumbar Two-Lumbar Thre   LIVER  BIOPSY N/A 07/06/2019   Procedure: LIVER BIOPSY;  Surgeon: Virl Cagey, MD;  [facility]: [facility];  Service: General;  Laterality: N/A;   LUMBAR PERCUTANEOUS PEDICLE SCREW 1 LEVEL N/A 04/05/2017   Procedure: LUMBAR PERCUTANEOUS PEDICLE SCREW LUMBAR THREE-FOUR;  Surgeon: Kary Kos, MD;  Location: [place];  Service: Neurosurgery;  Laterality: N/A;   ORIF CALCANEOUS FRACTURE Right 09/19/2014   Procedure: OPEN REDUCTION INTERNAL FIXATION (ORIF) CALCANEOUS FRACTURE;  Surgeon: Newt Minion, MD;  Location: [place];  Service: Orthopedics;  Laterality: Right;   ORIF CALCANEOUS FRACTURE Left 09/19/2014   Procedure: OPEN REDUCTION INTERNAL FIXATION (ORIF) CALCANEOUS FRACTURE;  Surgeon: Newt Minion, MD;  Location: [place];  Service: Orthopedics;  Laterality: Left;   POLYPECTOMY  06/05/2019   Procedure: POLYPECTOMY;  Surgeon: Rogene Houston, MD;  [facility];  Service: Endoscopy;;  cecal polyp biopsy forcep, ascending polyp cold snare    There were no vitals filed for this visit.    Subjective Assessment - 04/19/21  0922     Subjective States he had back surgery few years ago and they recently took hardware out of his back. States he goes to the pain clinic and that have recently ablated his nerves. States this has been done twice, the first time it helped a lot but this recent go around did not help. States 4/4//22 he had the screws in his thoracic spine  removed. States that he is now coming to therapy. States that he has a lot of appointments and insurance doesn't want to pay things. Current pain sitting 6/10 but when he stands it is 9/10. Reports he can't stand for long periods of time he has to do a little bit then sit down because of pain. States he can stand for about 15 minutes before his pain increases to the point where he has to sit down. Most of the pain is in the lumbar spine but he hurts throughout his spine. Reports last fusion was over a year ago.    Pertinent History anxiety,  depression, R BKA, lumbar fusion L3-S1 2018.  thoracic fusion T11/T12, cervical fusion C5-6, chronic burst fracture L2, set up for getting new prosthesis    Patient Stated Goals to have less pain and to walk straight (not bent over)    Currently in Pain? Yes    Pain Score 6     Pain Location Back    Pain Orientation Mid;Lower;Upper    Pain Descriptors / Indicators Pressure    Pain Radiating Towards radiating up and down spine    Pain Frequency Constant    Aggravating Factors  standing    Pain Relieving Factors sititng                [facility] PT Assessment - 04/19/21 0001       Assessment   Medical Diagnosis cervical fusion   treatment is for low back   Referring Provider (PT) Kary Kos MD    Onset Date/Surgical Date 01/09/21    Next MD Visit 06/27/21   [facility]anger apt on 7/20   Prior Therapy yes for back and leg      Balance Screen   Has the patient fallen in the past 6 months Yes    How many times? 3    Has the patient had a decrease in activity level because of a fear of falling?  No    Is the patient reluctant to leave their home because of a fear of falling?  No      Prior Function   Level of Independence Independent      Cognition   Overall Cognitive Status Within Functional Limits for tasks assessed      ROM / Strength   AROM / PROM / Strength AROM;Strength      AROM   Overall AROM Comments limited hip extension bilaterally    AROM Assessment Site Lumbar;Hip    Right/Left Shoulder Left;Right    Lumbar Flexion 100% limited   no change in symptoms   Lumbar Extension 100% limited   increase in pain   Lumbar - Right Side Bend 100% limited   increase pain   Lumbar - Left Side Bend 100% limited   increase in pain     Strength   Strength Assessment Site Shoulder    Right/Left Shoulder Right;Left      Ambulation/Gait   Ambulation/Gait Yes    Ambulation/Gait Assistance 6: Modified independent (Device/Increase time)    Ambulation Distance (Feet) 226 Feet    Gait  Pattern Decreased arm swing - right;Decreased arm swing - left;Decreased stride length;Decreased hip/knee flexion - right;Decreased hip/knee flexion - left;Antalgic;Trunk flexed    Gait Comments 2MW crouched gait - right hip doesn't extend - marching gait - prosthesis on right                        Objective measurements completed on examination: See above findings.       [facility] Adult PT Treatment/Exercise - 04/19/21 0001       Exercises   Exercises Lumbar      Lumbar Exercises: Prone   Single Arm Raise --   8 minutes - started with 3 pillows progressed to no pillows                   PT Education - 04/19/21 0940     Education Details on prone lying, anatomy, HEP and current POC.    Person(s) Educated Patient    Methods Explanation    Comprehension Verbalized understanding              PT Short Term Goals - 04/19/21 0857       PT SHORT TERM GOAL #1   Title Patient will report at least 25% improvement in overall symptoms and/or function to demonstrate improved functional mobility    Time 3    Period Weeks    Status New    Target Date 05/10/21      PT SHORT TERM GOAL #2   Title PAtietn will report laying on stomach at least 15 minutes at day to imprve right hip flexor length    Time 3    Period Weeks    Status New    Target Date 05/10/21      PT SHORT TERM GOAL #3   Title Patient will be independent in self management strategies to improve quality of life and functional outcomes.    Time 3    Period Weeks    Status New    Target Date 05/10/21               PT Long Term Goals - 04/19/21 0858       PT LONG TERM GOAL #1   Title Patient will be able to perform hip extension in prone without pain to improve ability to perform active hip extension    Time 6    Period Weeks    Status New    Target Date 05/31/21      PT LONG TERM GOAL #2   Title Patient will report at least 50% improvement in overall symptoms and/or function to  demonstrate improved functional mobility    Time 6    Period Weeks    Status New    Target Date 05/31/21      PT LONG TERM GOAL #3   Title Patient will be able to walk at least 300 feet in 2 minutes without needing to sit secondary to pain    Time 6    Period Weeks    Status New    Target Date 05/31/21                    Plan - 04/19/21 1141     Clinical Impression Statement Patient is a 65 y.o. male who presents to physical therapy with complaint of back pain. Patient with history of multiple back surgeries including C5/6 fusion, L3-S1 fusion, T11-T12 fusion with subsequent hardware removal, right below knee  amputation and has a chronic burst fracture of L2 that are likely contributing to current condition. Patient with limited hip extension noted with ambulation and educated on prone lying on this date to hip reduce stress on lumbar spine with walking as this is most aggravating activity. Patient will benefit from skilled physical therapy services to address these deficits to reduce pain, improve level of function with ADLs, functional mobility tasks, and reduce risk for falls, though overall prognosis for this patient is guarded secondary to extensive surgical history.    Personal Factors and Comorbidities Comorbidity 1;Comorbidity 2;Comorbidity 3+    Comorbidities hx of c5/6 fusion, T11/12 fusion with partial hardware removal, L3-S1 fusion, chronic burst fracture at L2, R BKA, multiple ablations    Examination-Activity Limitations Sit;Stairs;Stand;Transfers;Locomotion Level;Lift;Carry;Bend    Examination-Participation Restrictions Cleaning;Community Activity;Meal Prep;Yard Work;Shop    Stability/Clinical Decision Making Evolving/Moderate complexity    Clinical Decision Making Moderate    Rehab Potential Poor    PT Frequency 2x / week    PT Duration 6 weeks    PT Treatment/Interventions ADLs/Self Care Home Management;Aquatic Therapy;Cryotherapy;Electrical Stimulation;Moist  Heat;Therapeutic exercise;Therapeutic activities;Neuromuscular re-education;Patient/family education;Manual techniques;Dry needling    PT Next Visit Plan hip flexor lengthening, posterior chain strengthenig, ROM, STM for pain as indicated    PT Home Exercise Plan prone lying    Consulted and Agree with Plan of Care Patient             Patient will benefit from skilled therapeutic intervention in order to improve the following deficits and impairments:  Pain, Decreased strength, Decreased activity tolerance, Decreased mobility, Difficulty walking, Decreased range of motion, Abnormal gait, Decreased endurance  Visit Diagnosis: Lumbar pain  Muscle weakness (generalized)  Difficulty in walking, not elsewhere classified     Problem List Patient Active Problem List   Diagnosis Date Noted   Painful orthopaedic hardware ([place]) 01/09/2021   Abdominal pain, left lower quadrant 12/15/2020   Loss of weight 12/15/2020   Chronic, continuous use of opioids 12/15/2020   Acute hematogenous osteomyelitis of right foot ([place]) 01/29/2020   Post-traumatic arthritis of ankle, right    Cutaneous abscess of right ankle    Subacute osteomyelitis of right tibia ([facility])    Fatty liver 06/30/2019   Calculus of gallbladder without cholecystitis without obstruction 06/30/2019   Nausea without vomiting 05/13/2019   Abdominal bloating 05/13/2019   Wound infection following procedure 12/19/2018   Malunion of joint fusion ([place]) 11/26/2018   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[place])    Status post lumbar spinal fusion 09/10/2018   S/P ankle fusion 11/27/2017   Nonunion of subtalar arthrodesis    Avascular necrosis of talus, right (H[facility])    Post-traumatic osteoarthritis, left ankle and foot 10/22/2017   DDD (degenerative disc disease), lumbar 04/05/2017   Fracture of L2 vertebra ([place]) 01/12/2015   Acute osteomyelitis of calcaneum ([place]) 10/09/2014   Dysuria 10/05/2014   Cellulitis 10/05/2014   Fall from ladder 09/21/2014    L2 vertebral fracture ([place]) 09/21/2014   Bilateral calcaneal fractures 09/21/2014   Chronic pain 09/21/2014   Acute blood loss anemia 09/21/2014   Open fracture of both calcanei 09/15/2014   11:48 AM, 04/19/21 Jerene Pitch, DPT Physical Therapy with [facility]  [phone] office   [facility] [address] [place], [place], [address] Phone: [phone]   Fax:  [phone]  Name: Bradley Espinoza MRN: [id number] Date of Birth: Mar 01, 1956

## 2021-04-19 NOTE — H&P (Signed)
Bradley Espinoza; [id number]; [birth year]   HPI Patient is a 65 year old white male who was referred to my care by Dr. Allyn Kenner for evaluation and treatment of a right inguinal hernia.  He states he noticed the hernia over the past few months.  It is made worse with straining.  Is starting to affect his daily activity.  No nausea or vomiting has been noted.  He is able to reduce it when he lies down.  He has had bilateral inguinal hernia repairs done in the remote past. Past Medical History:  Diagnosis Date   Anxiety    Arthritis    low back pain, lumbar radiculopathy   Depression    Fatty liver    Fracture    B/L ankles   GERD (gastroesophageal reflux disease)    Headache(784.0)    allergy related    History of kidney stones    passed stones   History of stomach ulcers    Retained orthopedic hardware    failed retained hardware right foot   Wears glasses    Wears glasses    Wears partial dentures    upper    Past Surgical History:  Procedure Laterality Date   AMPUTATION Right 01/29/2020   Procedure: RIGHT BELOW KNEE AMPUTATION;  Surgeon: Newt Minion, MD;  Location: [place];  Service: Orthopedics;  Laterality: Right;   ANKLE FUSION Right 05/11/2015   Procedure: Right Posterior Arthroscopic Subtalar Arthrodesis;  Surgeon: Newt Minion, MD;  Location: [place];  Service: Orthopedics;  Laterality: Right;   ANKLE FUSION Right 11/27/2017   Procedure: RIGHT TIBIOCALCANEAL FUSION;  Surgeon: Newt Minion, MD;  Location: [place];  Service: Orthopedics;  Laterality: Right;   ANKLE FUSION Right 11/26/2018   Procedure: RIGHT ANTERIOR ANKLE FUSION, APPLY VAC;  Surgeon: Newt Minion, MD;  Location: [place];  Service: Orthopedics;  Laterality: Right;   ANKLE FUSION Right 09/09/2019   Procedure: REVISION RIGHT ANKLE FUSION;  Surgeon: Newt Minion, MD;  Location: [place];  Service: Orthopedics;  Laterality: Right;   ANTERIOR LAT LUMBAR FUSION N/A 04/05/2017   Procedure: Extreme Lateral Interbody  Fusion - Lumbar three-lumbar four ,exploration of fusion Posterior augmentation with globus addition Removal hardware Lumbar one-three. Lumbar four-sacral one,  Removal internal bone growth stimulator;  Surgeon: Kary Kos, MD;  Location: [place];  Service: Neurosurgery;  Laterality: N/A;   APPLICATION OF WOUND VAC  12/23/2018   Procedure: Application Of Wound Vac;  Surgeon: Newt Minion, MD;  Location: [place];  Service: Orthopedics;;   BACK SURGERY  2004   x 2   BIOPSY  06/05/2019   Procedure: BIOPSY;  Surgeon: Rogene Houston, MD;  [facility];  Service: Endoscopy;;  gastric biopsy   CERVICAL SPINE SURGERY  2008   CHOLECYSTECTOMY N/A 07/06/2019   Procedure: LAPAROSCOPIC CHOLECYSTECTOMY;  Surgeon: Virl Cagey, MD;  [facility];  Service: General;  Laterality: N/A;   COLONOSCOPY WITH PROPOFOL N/A 06/05/2019   Procedure: COLONOSCOPY WITH PROPOFOL;  Surgeon: Rogene Houston, MD;  [facility];  Service: Endoscopy;  Laterality: N/A;   ESOPHAGOGASTRODUODENOSCOPY     ESOPHAGOGASTRODUODENOSCOPY (EGD) WITH PROPOFOL N/A 06/05/2019   Procedure: ESOPHAGOGASTRODUODENOSCOPY (EGD) WITH PROPOFOL;  Surgeon: Rogene Houston, MD;  [facility];  Service: Endoscopy;  Laterality: N/A;  1220pm   HARDWARE REMOVAL Right 10/09/2014   Procedure: Removal Deep Hardware, Irrigation and Debridement Calcaneus, Place Antibiotic Beads and Wound VAC ;  Surgeon: Beverely Low  Fernanda Drum, MD;  Location: [place];  Service: Orthopedics;  Laterality: Right;   HARDWARE REMOVAL Right 08/12/2015   Procedure: Removal Deep Hardware Right Foot;  Surgeon: Newt Minion, MD;  Location: [place];  Service: Orthopedics;  Laterality: Right;   HARDWARE REMOVAL Right 11/26/2018   Procedure: REMOVAL RIGHT TIBIOCALCANEAL NAIL;  Surgeon: Newt Minion, MD;  Location: [place];  Service: Orthopedics;  Laterality: Right;   HARDWARE REMOVAL Right 12/23/2018   Procedure: RIGHT ANKLE REMOVE HARDWARE, PLACE ANTIBIOTIC BEADS and  placement of wound vac;  Surgeon: Newt Minion, MD;  Location: [place];  Service: Orthopedics;  Laterality: Right;   HARDWARE REMOVAL Right 09/29/2019   Procedure: RIGHT ANKLE REMOVE HARDWARE, DEBRIDEMENT;  Surgeon: Newt Minion, MD;  Location: [place];  Service: Orthopedics;  Laterality: Right;   HARDWARE REMOVAL N/A 01/09/2021   Procedure: Removal of hardware upper thoracic screws;  Surgeon: Kary Kos, MD;  Location: [place];  Service: Neurosurgery;  Laterality: N/A;   I & D EXTREMITY Right 09/15/2014   Procedure: IRRIGATION AND DEBRIDEMENT Ankle;  Surgeon: Renette Butters, MD;  Location: [place];  Service: Orthopedics;  Laterality: Right;   INGUINAL HERNIA REPAIR Bilateral    LAMINECTOMY WITH POSTERIOR LATERAL ARTHRODESIS LEVEL 4 N/A 09/10/2018   Procedure: Posterior Lateral Fusion - Thoracic Eleven-Thoracic Twelve - Thoracic Twelve-Lumbar One - Lumbar One-Lumbar Two - Lumbar Two-Lumbar Three with instrumentaion and PLA;  Surgeon: Kary Kos, MD;  Location: [place];  Service: Neurosurgery;  Laterality: N/A;  Posterior Lateral Fusion - Thoracic Eleven-Thoracic Twelve - Thoracic Twelve-Lumbar One - Lumbar One-Lumbar Two - Lumbar Two-Lumbar Thre   LIVER BIOPSY N/A 07/06/2019   Procedure: LIVER BIOPSY;  Surgeon: Virl Cagey, MD;  [facility]: [facility];  Service: General;  Laterality: N/A;   LUMBAR PERCUTANEOUS PEDICLE SCREW 1 LEVEL N/A 04/05/2017   Procedure: LUMBAR PERCUTANEOUS PEDICLE SCREW LUMBAR THREE-FOUR;  Surgeon: Kary Kos, MD;  Location: [place];  Service: Neurosurgery;  Laterality: N/A;   ORIF CALCANEOUS FRACTURE Right 09/19/2014   Procedure: OPEN REDUCTION INTERNAL FIXATION (ORIF) CALCANEOUS FRACTURE;  Surgeon: Newt Minion, MD;  Location: [place];  Service: Orthopedics;  Laterality: Right;   ORIF CALCANEOUS FRACTURE Left 09/19/2014   Procedure: OPEN REDUCTION INTERNAL FIXATION (ORIF) CALCANEOUS FRACTURE;  Surgeon: Newt Minion, MD;  Location: [place];  Service: Orthopedics;  Laterality: Left;    POLYPECTOMY  06/05/2019   Procedure: POLYPECTOMY;  Surgeon: Rogene Houston, MD;  [facility];  Service: Endoscopy;;  cecal polyp biopsy forcep, ascending polyp cold snare    Family History  Problem Relation Age of Onset   Diabetes Father    Cancer Other     Current Outpatient Medications on File Prior to Visit  Medication Sig Dispense Refill   albuterol (VENTOLIN HFA) 108 (90 Base) MCG/ACT inhaler Inhale 2 puffs into the lungs every 4 (four) hours as needed for wheezing or shortness of breath.     dicyclomine (BENTYL) 10 MG capsule Take 1 capsule (10 mg total) by mouth every 12 (twelve) hours as needed for spasms. 60 capsule 2   docusate sodium (COLACE) 100 MG capsule Take 100 mg by mouth daily as needed for mild constipation.      gabapentin (NEURONTIN) 300 MG capsule Take 300 mg by mouth 2 (two) times daily.     megestrol (MEGACE) 40 MG tablet Take 1 tablet (40 mg total) by mouth daily. 90 tablet 3   methocarbamol (ROBAXIN) 500 MG tablet Take  by mouth.     ondansetron (ZOFRAN) 4 MG tablet TAKE (1) TABLET THREE TIMES DAILY AS NEEDED FOR NAUSEA & VOMITING. 90 tablet 3   oxyCODONE-acetaminophen (PERCOCET) 10-325 MG tablet Take 1 tablet by mouth every 4 (four) hours as needed for pain. (Patient taking differently: Take 1 tablet by mouth every 6 (six) hours as needed for pain.) 30 tablet 0   pantoprazole (PROTONIX) 40 MG tablet Take 40 mg by mouth daily.     tamsulosin (FLOMAX) 0.4 MG CAPS capsule Take 0.4 mg by mouth in the morning and at bedtime.     testosterone cypionate (DEPOTESTOSTERONE CYPIONATE) 200 MG/ML injection Inject 1 mL (200 mg total) into the muscle every 14 (fourteen) days. 6 mL 3   Current Facility-Administered Medications on File Prior to Visit  Medication Dose Route Frequency Provider Last Rate Last Admin   0.45 % sodium chloride infusion   Intravenous Continuous Rayburn, Neta Mends, PA-C       docusate sodium (COLACE) capsule 100 mg  100 mg Oral BID  Rayburn, Neta Mends, PA-C       oxyCODONE (Oxy IR/ROXICODONE) immediate release tablet 5-10 mg  5-10 mg Oral Q3H PRN Rayburn, Neta Mends, PA-C       senna (SENOKOT) tablet 8.6 mg  1 tablet Oral BID Rayburn, Shawn Montgomery, PA-C       sorbitol 70 % solution 30 mL  30 mL Oral Daily PRN Rayburn, Neta Mends, PA-C        Allergies  Allergen Reactions   Doxycycline Nausea Only   Levofloxacin Other (See Comments)    Blister in mouth   Codeine Nausea And Vomiting    Social History   Substance and Sexual Activity  Alcohol Use No   Comment:  quit 1998    Social History   Tobacco Use  Smoking Status Former   Packs/day: 1.00   Years: 10.00   Pack years: 10.00   Types: Cigars, Cigarettes   Start date: 10/08/1976   Quit date: 09/2017   Years since quitting: 3.6  Smokeless Tobacco Never  Tobacco Comments   quit smoking cegar 09/10/2018    Review of Systems  Constitutional: Negative.   HENT: Negative.    Eyes: Negative.   Respiratory: Negative.    Cardiovascular: Negative.   Gastrointestinal: Negative.   Genitourinary: Negative.   Musculoskeletal:  Positive for back pain and neck pain.  Skin: Negative.   Neurological: Negative.   Endo/Heme/Allergies: Negative.   Psychiatric/Behavioral: Negative.     Objective   Vitals:   04/18/21 1433  BP: (!) 162/79  Pulse: 90  Resp: 16  Temp: 98.9 F (37.2 C)  SpO2: 95%    Physical Exam Vitals reviewed.  Constitutional:      Appearance: Normal appearance. He is normal weight. He is not ill-appearing.  HENT:     Head: Normocephalic and atraumatic.  Cardiovascular:     Rate and Rhythm: Normal rate and regular rhythm.     Heart sounds: Normal heart sounds. No murmur heard.   No friction rub. No gallop.  Pulmonary:     Effort: Pulmonary effort is normal. No respiratory distress.     Breath sounds: Normal breath sounds. No stridor. No wheezing, rhonchi or rales.  Abdominal:     General: Abdomen is flat. Bowel  sounds are normal. There is no distension.     Palpations: Abdomen is soft. There is no mass.     Tenderness: There is no abdominal tenderness. There is no guarding or  rebound.     Hernia: A hernia is present.     Comments: Reducible right inguinal hernia.  Genitourinary:    Testes: Normal.  Skin:    General: Skin is warm and dry.  Neurological:     Mental Status: He is alert and oriented to person, place, and time.    Assessment  Recurrent right inguinal hernia, symptomatic Plan  Patient is scheduled for a recurrent right inguinal herniorrhaphy with mesh on 05/05/2021.  The risks and benefits of the procedure including bleeding, infection, nerve injury, mesh use, the possibility of recurrence of the hernia were fully explained to the patient, who gave informed consent.

## 2021-04-19 NOTE — Progress Notes (Signed)
Bradley Espinoza; [id number]; Sep 14, 1956   HPI Patient is a 65 year old white male who was referred to my care by Dr. Allyn Kenner for evaluation and treatment of a right inguinal hernia.  He states he noticed the hernia over the past few months.  It is made worse with straining.  Is starting to affect his daily activity.  No nausea or vomiting has been noted.  He is able to reduce it when he lies down.  He has had bilateral inguinal hernia repairs done in the remote past. Past Medical History:  Diagnosis Date   Anxiety    Arthritis    low back pain, lumbar radiculopathy   Depression    Fatty liver    Fracture    B/L ankles   GERD (gastroesophageal reflux disease)    Headache(784.0)    allergy related    History of kidney stones    passed stones   History of stomach ulcers    Retained orthopedic hardware    failed retained hardware right foot   Wears glasses    Wears glasses    Wears partial dentures    upper    Past Surgical History:  Procedure Laterality Date   AMPUTATION Right 01/29/2020   Procedure: RIGHT BELOW KNEE AMPUTATION;  Surgeon: Newt Minion, MD;  Location: [place];  Service: Orthopedics;  Laterality: Right;   ANKLE FUSION Right 05/11/2015   Procedure: Right Posterior Arthroscopic Subtalar Arthrodesis;  Surgeon: Newt Minion, MD;  Location: [place];  Service: Orthopedics;  Laterality: Right;   ANKLE FUSION Right 11/27/2017   Procedure: RIGHT TIBIOCALCANEAL FUSION;  Surgeon: Newt Minion, MD;  Location: [place];  Service: Orthopedics;  Laterality: Right;   ANKLE FUSION Right 11/26/2018   Procedure: RIGHT ANTERIOR ANKLE FUSION, APPLY VAC;  Surgeon: Newt Minion, MD;  Location: [place];  Service: Orthopedics;  Laterality: Right;   ANKLE FUSION Right 09/09/2019   Procedure: REVISION RIGHT ANKLE FUSION;  Surgeon: Newt Minion, MD;  Location: [place];  Service: Orthopedics;  Laterality: Right;   ANTERIOR LAT LUMBAR FUSION N/A 04/05/2017   Procedure: Extreme Lateral Interbody  Fusion - Lumbar three-lumbar four ,exploration of fusion Posterior augmentation with globus addition Removal hardware Lumbar one-three. Lumbar four-sacral one,  Removal internal bone growth stimulator;  Surgeon: Kary Kos, MD;  Location: [place];  Service: Neurosurgery;  Laterality: N/A;   APPLICATION OF WOUND VAC  12/23/2018   Procedure: Application Of Wound Vac;  Surgeon: Newt Minion, MD;  Location: [place];  Service: Orthopedics;;   BACK SURGERY  2004   x 2   BIOPSY  06/05/2019   Procedure: BIOPSY;  Surgeon: Rogene Houston, MD;  [facility];  Service: Endoscopy;;  gastric biopsy   CERVICAL SPINE SURGERY  2008   CHOLECYSTECTOMY N/A 07/06/2019   Procedure: LAPAROSCOPIC CHOLECYSTECTOMY;  Surgeon: Virl Cagey, MD;  [facility];  Service: General;  Laterality: N/A;   COLONOSCOPY WITH PROPOFOL N/A 06/05/2019   Procedure: COLONOSCOPY WITH PROPOFOL;  Surgeon: Rogene Houston, MD;  [facility];  Service: Endoscopy;  Laterality: N/A;   ESOPHAGOGASTRODUODENOSCOPY     ESOPHAGOGASTRODUODENOSCOPY (EGD) WITH PROPOFOL N/A 06/05/2019   Procedure: ESOPHAGOGASTRODUODENOSCOPY (EGD) WITH PROPOFOL;  Surgeon: Rogene Houston, MD;  [facility];  Service: Endoscopy;  Laterality: N/A;  1220pm   HARDWARE REMOVAL Right 10/09/2014   Procedure: Removal Deep Hardware, Irrigation and Debridement Calcaneus, Place Antibiotic Beads and Wound VAC ;  Surgeon: Beverely Low  Fernanda Drum, MD;  Location: [place];  Service: Orthopedics;  Laterality: Right;   HARDWARE REMOVAL Right 08/12/2015   Procedure: Removal Deep Hardware Right Foot;  Surgeon: Newt Minion, MD;  Location: [place];  Service: Orthopedics;  Laterality: Right;   HARDWARE REMOVAL Right 11/26/2018   Procedure: REMOVAL RIGHT TIBIOCALCANEAL NAIL;  Surgeon: Newt Minion, MD;  Location: [place];  Service: Orthopedics;  Laterality: Right;   HARDWARE REMOVAL Right 12/23/2018   Procedure: RIGHT ANKLE REMOVE HARDWARE, PLACE ANTIBIOTIC BEADS and  placement of wound vac;  Surgeon: Newt Minion, MD;  Location: [place];  Service: Orthopedics;  Laterality: Right;   HARDWARE REMOVAL Right 09/29/2019   Procedure: RIGHT ANKLE REMOVE HARDWARE, DEBRIDEMENT;  Surgeon: Newt Minion, MD;  Location: [place];  Service: Orthopedics;  Laterality: Right;   HARDWARE REMOVAL N/A 01/09/2021   Procedure: Removal of hardware upper thoracic screws;  Surgeon: Kary Kos, MD;  Location: [place];  Service: Neurosurgery;  Laterality: N/A;   I & D EXTREMITY Right 09/15/2014   Procedure: IRRIGATION AND DEBRIDEMENT Ankle;  Surgeon: Renette Butters, MD;  Location: [place];  Service: Orthopedics;  Laterality: Right;   INGUINAL HERNIA REPAIR Bilateral    LAMINECTOMY WITH POSTERIOR LATERAL ARTHRODESIS LEVEL 4 N/A 09/10/2018   Procedure: Posterior Lateral Fusion - Thoracic Eleven-Thoracic Twelve - Thoracic Twelve-Lumbar One - Lumbar One-Lumbar Two - Lumbar Two-Lumbar Three with instrumentaion and PLA;  Surgeon: Kary Kos, MD;  Location: [place];  Service: Neurosurgery;  Laterality: N/A;  Posterior Lateral Fusion - Thoracic Eleven-Thoracic Twelve - Thoracic Twelve-Lumbar One - Lumbar One-Lumbar Two - Lumbar Two-Lumbar Thre   LIVER BIOPSY N/A 07/06/2019   Procedure: LIVER BIOPSY;  Surgeon: Virl Cagey, MD;  [facility];  Service: General;  Laterality: N/A;   LUMBAR PERCUTANEOUS PEDICLE SCREW 1 LEVEL N/A 04/05/2017   Procedure: LUMBAR PERCUTANEOUS PEDICLE SCREW LUMBAR THREE-FOUR;  Surgeon: Kary Kos, MD;  Location: [place];  Service: Neurosurgery;  Laterality: N/A;   ORIF CALCANEOUS FRACTURE Right 09/19/2014   Procedure: OPEN REDUCTION INTERNAL FIXATION (ORIF) CALCANEOUS FRACTURE;  Surgeon: Newt Minion, MD;  Location: [place];  Service: Orthopedics;  Laterality: Right;   ORIF CALCANEOUS FRACTURE Left 09/19/2014   Procedure: OPEN REDUCTION INTERNAL FIXATION (ORIF) CALCANEOUS FRACTURE;  Surgeon: Newt Minion, MD;  Location: [place];  Service: Orthopedics;  Laterality: Left;    POLYPECTOMY  06/05/2019   Procedure: POLYPECTOMY;  Surgeon: Rogene Houston, MD;  [facility];  Service: Endoscopy;;  cecal polyp biopsy forcep, ascending polyp cold snare    Family History  Problem Relation Age of Onset   Diabetes Father    Cancer Other     Current Outpatient Medications on File Prior to Visit  Medication Sig Dispense Refill   albuterol (VENTOLIN HFA) 108 (90 Base) MCG/ACT inhaler Inhale 2 puffs into the lungs every 4 (four) hours as needed for wheezing or shortness of breath.     dicyclomine (BENTYL) 10 MG capsule Take 1 capsule (10 mg total) by mouth every 12 (twelve) hours as needed for spasms. 60 capsule 2   docusate sodium (COLACE) 100 MG capsule Take 100 mg by mouth daily as needed for mild constipation.      gabapentin (NEURONTIN) 300 MG capsule Take 300 mg by mouth 2 (two) times daily.     megestrol (MEGACE) 40 MG tablet Take 1 tablet (40 mg total) by mouth daily. 90 tablet 3   methocarbamol (ROBAXIN) 500 MG tablet Take  by mouth.     ondansetron (ZOFRAN) 4 MG tablet TAKE (1) TABLET THREE TIMES DAILY AS NEEDED FOR NAUSEA & VOMITING. 90 tablet 3   oxyCODONE-acetaminophen (PERCOCET) 10-325 MG tablet Take 1 tablet by mouth every 4 (four) hours as needed for pain. (Patient taking differently: Take 1 tablet by mouth every 6 (six) hours as needed for pain.) 30 tablet 0   pantoprazole (PROTONIX) 40 MG tablet Take 40 mg by mouth daily.     tamsulosin (FLOMAX) 0.4 MG CAPS capsule Take 0.4 mg by mouth in the morning and at bedtime.     testosterone cypionate (DEPOTESTOSTERONE CYPIONATE) 200 MG/ML injection Inject 1 mL (200 mg total) into the muscle every 14 (fourteen) days. 6 mL 3   Current Facility-Administered Medications on File Prior to Visit  Medication Dose Route Frequency Provider Last Rate Last Admin   0.45 % sodium chloride infusion   Intravenous Continuous Rayburn, Neta Mends, PA-C       docusate sodium (COLACE) capsule 100 mg  100 mg Oral BID  Rayburn, Neta Mends, PA-C       oxyCODONE (Oxy IR/ROXICODONE) immediate release tablet 5-10 mg  5-10 mg Oral Q3H PRN Rayburn, Neta Mends, PA-C       senna (SENOKOT) tablet 8.6 mg  1 tablet Oral BID Rayburn, Shawn Montgomery, PA-C       sorbitol 70 % solution 30 mL  30 mL Oral Daily PRN Rayburn, Neta Mends, PA-C        Allergies  Allergen Reactions   Doxycycline Nausea Only   Levofloxacin Other (See Comments)    Blister in mouth   Codeine Nausea And Vomiting    Social History   Substance and Sexual Activity  Alcohol Use No   Comment:  quit 1998    Social History   Tobacco Use  Smoking Status Former   Packs/day: 1.00   Years: 10.00   Pack years: 10.00   Types: Cigars, Cigarettes   Start date: 10/08/1976   Quit date: 09/2017   Years since quitting: 3.6  Smokeless Tobacco Never  Tobacco Comments   quit smoking cegar 09/10/2018    Review of Systems  Constitutional: Negative.   HENT: Negative.    Eyes: Negative.   Respiratory: Negative.    Cardiovascular: Negative.   Gastrointestinal: Negative.   Genitourinary: Negative.   Musculoskeletal:  Positive for back pain and neck pain.  Skin: Negative.   Neurological: Negative.   Endo/Heme/Allergies: Negative.   Psychiatric/Behavioral: Negative.     Objective   Vitals:   04/18/21 1433  BP: (!) 162/79  Pulse: 90  Resp: 16  Temp: 98.9 F (37.2 C)  SpO2: 95%    Physical Exam Vitals reviewed.  Constitutional:      Appearance: Normal appearance. He is normal weight. He is not ill-appearing.  HENT:     Head: Normocephalic and atraumatic.  Cardiovascular:     Rate and Rhythm: Normal rate and regular rhythm.     Heart sounds: Normal heart sounds. No murmur heard.   No friction rub. No gallop.  Pulmonary:     Effort: Pulmonary effort is normal. No respiratory distress.     Breath sounds: Normal breath sounds. No stridor. No wheezing, rhonchi or rales.  Abdominal:     General: Abdomen is flat. Bowel  sounds are normal. There is no distension.     Palpations: Abdomen is soft. There is no mass.     Tenderness: There is no abdominal tenderness. There is no guarding or  rebound.     Hernia: A hernia is present.     Comments: Reducible right inguinal hernia.  Genitourinary:    Testes: Normal.  Skin:    General: Skin is warm and dry.  Neurological:     Mental Status: He is alert and oriented to person, place, and time.    Assessment  Recurrent right inguinal hernia, symptomatic Plan  Patient is scheduled for a recurrent right inguinal herniorrhaphy with mesh on 05/05/2021.  The risks and benefits of the procedure including bleeding, infection, nerve injury, mesh use, the possibility of recurrence of the hernia were fully explained to the patient, who gave informed consent.

## 2021-04-24 ENCOUNTER — Encounter (HOSPITAL_COMMUNITY): Payer: PPO | Admitting: Physical Therapy

## 2021-05-01 NOTE — Patient Instructions (Signed)
TAYVEON OHR  05/01/2021     '@PREFPERIOPPHARMACY'$ @   Your procedure is scheduled on  05/05/2021.   Report to [facility] at  [place].   Call this number if you have problems the morning of surgery:  [phone]   Remember:  Do not eat or drink after midnight.      Take these medicines the morning of surgery with A SIP OF WATER                       gabapentin, zofran (if needed), oxycodone (if needed), flomax.    Do not wear jewelry, make-up or nail polish.  Do not wear lotions, powders, or perfumes, or deodorant.  Do not shave 48 hours prior to surgery.  Men may shave face and neck.  Do not bring valuables to the hospital.  [facility] is not responsible for any belongings or valuables.  Contacts, dentures or bridgework may not be worn into surgery.  Leave your suitcase in the car.  After surgery it may be brought to your room.  For patients admitted to the hospital, discharge time will be determined by your treatment team.  Patients discharged the day of surgery will not be allowed to drive home and must have someone with them for 24 hours.    Special instructions:     DO NOT smoke tobacco or vape for 24 hours before your procedure.  Please read over the following fact sheets that you were given. Coughing and Deep Breathing, Surgical Site Infection Prevention, Anesthesia Post-op Instructions, and Care and Recovery After Surgery      Open Hernia Repair, Adult, Care After What can I expect after the procedure? After the procedure, it is common to have: Mild discomfort. Slight bruising. Mild swelling. Pain in the belly (abdomen). A small amount of blood from the cut from surgery (incision). Follow these instructions at home: Your doctor may give you more specific instructions. If you have problems, callyour doctor. Medicines Take over-the-counter and prescription medicines only as told by your doctor. If told, take steps to prevent problems  with pooping (constipation). You may need to: Drink enough fluid to keep your pee (urine) pale yellow. Take medicines. You will be told what medicines to take. Eat foods that are high in fiber. These include beans, whole grains, and fresh fruits and vegetables. Limit foods that are high in fat and sugar. These include fried or sweet foods. Ask your doctor if you should avoid driving or using machines while you are taking your medicine. Incision care  Follow instructions from your doctor about how to take care of your incision. Make sure you: Wash your hands with soap and water for at least 20 seconds before and after you change your bandage (dressing). If you cannot use soap and water, use hand sanitizer. Change your bandage. Leave stitches or skin glue in place for at least 2 weeks. Leave tape strips alone unless you are told to take them off. You may trim the edges of the tape strips if they curl up. Check your incision every day for signs of infection. Check for: More redness, swelling, or pain. More fluid or blood. Warmth. Pus or a bad smell. Wear loose, soft clothing while your incision heals.  Activity  Rest as told by your doctor. Do not lift anything that is heavier than 10 lb (4.5 kg), or the limit that you are told. Do not play  contact sports until your doctor says that this is safe. If you were given a sedative during your procedure, do not drive or use machines until your doctor says that it is safe. A sedative is a medicine that helps you relax. Return to your normal activities when your doctor says that it is safe.  General instructions Do not take baths, swim, or use a hot tub. Ask your doctor about taking showers or sponge baths. Hold a pillow over your belly when you cough or sneeze. This helps with pain. Do not smoke or use any products that contain nicotine or tobacco. If you need help quitting, ask your doctor. Keep all follow-up visits. Contact a doctor if: You  have any of these signs of infection in or around your incision: More redness, swelling, or pain. More fluid or blood. Warmth. Pus. A bad smell. You have a fever or chills. You have blood in your poop (stool). You have not pooped (had a bowel movement) in 2-3 days. Medicine does not help your pain. Get help right away if: You have chest pain, or you are short of breath. You feel faint or light-headed. You have very bad pain. You vomit and your pain is worse. You have pain, swelling, or redness in a leg. These symptoms may be an emergency. Get help right away. Call your local emergency services (911 in the [place] Do not wait to see if the symptoms will go away. Do not drive yourself to the hospital. Summary After this procedure, it is common to have mild discomfort, slight bruising, and mild swelling. Follow instructions from your doctor about how to take care of your cut from surgery (incision). Check every day for signs of infection. Do not lift heavy objects or play contact sports until your doctor says it is safe. Return to your normal activities as told by your doctor. This information is not intended to replace advice given to you by your health care provider. Make sure you discuss any questions you have with your healthcare provider. Document Revised: 05/09/2020 Document Reviewed: 05/09/2020 Elsevier Patient Education  2022 [place] Anesthesia, Adult, Care After This sheet gives you information about how to care for yourself after your procedure. Your health care provider may also give you more specific instructions. If you have problems or questions, contact your health careprovider. What can I expect after the procedure? After the procedure, the following side effects are common: Pain or discomfort at the IV site. Nausea. Vomiting. Sore throat. Trouble concentrating. Feeling cold or chills. Feeling weak or tired. Sleepiness and fatigue. Soreness and body  aches. These side effects can affect parts of the body that were not involved in surgery. Follow these instructions at home: For the time period you were told by your health care provider:  Rest. Do not participate in activities where you could fall or become injured. Do not drive or use machinery. Do not drink alcohol. Do not take sleeping pills or medicines that cause drowsiness. Do not make important decisions or sign legal documents. Do not take care of children on your own.  Eating and drinking Follow any instructions from your health care provider about eating or drinking restrictions. When you feel hungry, start by eating small amounts of foods that are soft and easy to digest (bland), such as toast. Gradually return to your regular diet. Drink enough fluid to keep your urine pale yellow. If you vomit, rehydrate by drinking water, juice, or clear broth. General instructions If you  have sleep apnea, surgery and certain medicines can increase your risk for breathing problems. Follow instructions from your health care provider about wearing your sleep device: Anytime you are sleeping, including during daytime naps. While taking prescription pain medicines, sleeping medicines, or medicines that make you drowsy. Have a responsible adult stay with you for the time you are told. It is important to have someone help care for you until you are awake and alert. Return to your normal activities as told by your health care provider. Ask your health care provider what activities are safe for you. Take over-the-counter and prescription medicines only as told by your health care provider. If you smoke, do not smoke without supervision. Keep all follow-up visits as told by your health care provider. This is important. Contact a health care provider if: You have nausea or vomiting that does not get better with medicine. You cannot eat or drink without vomiting. You have pain that does not get  better with medicine. You are unable to pass urine. You develop a skin rash. You have a fever. You have redness around your IV site that gets worse. Get help right away if: You have difficulty breathing. You have chest pain. You have blood in your urine or stool, or you vomit blood. Summary After the procedure, it is common to have a sore throat or nausea. It is also common to feel tired. Have a responsible adult stay with you for the time you are told. It is important to have someone help care for you until you are awake and alert. When you feel hungry, start by eating small amounts of foods that are soft and easy to digest (bland), such as toast. Gradually return to your regular diet. Drink enough fluid to keep your urine pale yellow. Return to your normal activities as told by your health care provider. Ask your health care provider what activities are safe for you. This information is not intended to replace advice given to you by your health care provider. Make sure you discuss any questions you have with your healthcare provider. Document Revised: 06/09/2020 Document Reviewed: 01/07/2020 Elsevier Patient Education  2022 [place]. How to Use Chlorhexidine for Bathing Chlorhexidine gluconate (CHG) is a germ-killing (antiseptic) solution that is used to clean the skin. It can get rid of the bacteria that normally live on the skin and can keep them away for about 24 hours. To clean your skin with CHG, you may be given: A CHG solution to use in the shower or as part of a sponge bath. A prepackaged cloth that contains CHG. Cleaning your skin with CHG may help lower the risk for infection: While you are staying in the intensive care unit of the hospital. If you have a vascular access, such as a central line, to provide short-term or long-term access to your veins. If you have a catheter to drain urine from your bladder. If you are on a ventilator. A ventilator is a machine that helps  you breathe by moving air in and out of your lungs. After surgery. What are the risks? Risks of using CHG include: A skin reaction. Hearing loss, if CHG gets in your ears. Eye injury, if CHG gets in your eyes and is not rinsed out. The CHG product catching fire. Make sure that you avoid smoking and flames after applying CHG to your skin. Do not use CHG: If you have a chlorhexidine allergy or have previously reacted to chlorhexidine. On babies younger than 2  months of age. How to use CHG solution Use CHG only as told by your health care provider, and follow the instructions on the label. Use the full amount of CHG as directed. Usually, this is one bottle. During a shower Follow these steps when using CHG solution during a shower (unless your health care provider gives you different instructions): Start the shower. Use your normal soap and shampoo to wash your face and hair. Turn off the shower or move out of the shower stream. Pour the CHG onto a clean washcloth. Do not use any type of brush or rough-edged sponge. Starting at your neck, lather your body down to your toes. Make sure you follow these instructions: If you will be having surgery, pay special attention to the part of your body where you will be having surgery. Scrub this area for at least 1 minute. Do not use CHG on your head or face. If the solution gets into your ears or eyes, rinse them well with water. Avoid your genital area. Avoid any areas of skin that have broken skin, cuts, or scrapes. Scrub your back and under your arms. Make sure to wash skin folds. Let the lather sit on your skin for 1-2 minutes or as long as told by your health care provider. Thoroughly rinse your entire body in the shower. Make sure that all body creases and crevices are rinsed well. Dry off with a clean towel. Do not put any substances on your body afterward--such as powder, lotion, or perfume--unless you are told to do so by your health care  provider. Only use lotions that are recommended by the manufacturer. Put on clean clothes or pajamas. If it is the night before your surgery, sleep in clean sheets.  During a sponge bath Follow these steps when using CHG solution during a sponge bath (unless your health care provider gives you different instructions): Use your normal soap and shampoo to wash your face and hair. Pour the CHG onto a clean washcloth. Starting at your neck, lather your body down to your toes. Make sure you follow these instructions: If you will be having surgery, pay special attention to the part of your body where you will be having surgery. Scrub this area for at least 1 minute. Do not use CHG on your head or face. If the solution gets into your ears or eyes, rinse them well with water. Avoid your genital area. Avoid any areas of skin that have broken skin, cuts, or scrapes. Scrub your back and under your arms. Make sure to wash skin folds. Let the lather sit on your skin for 1-2 minutes or as long as told by your health care provider. Using a different clean, wet washcloth, thoroughly rinse your entire body. Make sure that all body creases and crevices are rinsed well. Dry off with a clean towel. Do not put any substances on your body afterward--such as powder, lotion, or perfume--unless you are told to do so by your health care provider. Only use lotions that are recommended by the manufacturer. Put on clean clothes or pajamas. If it is the night before your surgery, sleep in clean sheets. How to use CHG prepackaged cloths Only use CHG cloths as told by your health care provider, and follow the instructions on the label. Use the CHG cloth on clean, dry skin. Do not use the CHG cloth on your head or face unless your health care provider tells you to. When washing with the CHG cloth: Avoid your  genital area. Avoid any areas of skin that have broken skin, cuts, or scrapes. Before surgery Follow these steps  when using a CHG cloth to clean before surgery (unless your health care provider gives you different instructions): Using the CHG cloth, vigorously scrub the part of your body where you will be having surgery. Scrub using a back-and-forth motion for 3 minutes. The area on your body should be completely wet with CHG when you are done scrubbing. Do not rinse. Discard the cloth and let the area air-dry. Do not put any substances on the area afterward, such as powder, lotion, or perfume. Put on clean clothes or pajamas. If it is the night before your surgery, sleep in clean sheets.  For general bathing Follow these steps when using CHG cloths for general bathing (unless your health care provider gives you different instructions). Use a separate CHG cloth for each area of your body. Make sure you wash between any folds of skin and between your fingers and toes. Wash your body in the following order, switching to a new cloth after each step: The front of your neck, shoulders, and chest. Both of your arms, under your arms, and your hands. Your stomach and groin area, avoiding the genitals. Your right leg and foot. Your left leg and foot. The back of your neck, your back, and your buttocks. Do not rinse. Discard the cloth and let the area air-dry. Do not put any substances on your body afterward--such as powder, lotion, or perfume--unless you are told to do so by your health care provider. Only use lotions that are recommended by the manufacturer. Put on clean clothes or pajamas. Contact a health care provider if: Your skin gets irritated after scrubbing. You have questions about using your solution or cloth. Get help right away if: Your eyes become very red or swollen. Your eyes itch badly. Your skin itches badly and is red or swollen. Your hearing changes. You have trouble seeing. You have swelling or tingling in your mouth or throat. You have trouble breathing. You swallow any  chlorhexidine. Summary Chlorhexidine gluconate (CHG) is a germ-killing (antiseptic) solution that is used to clean the skin. Cleaning your skin with CHG may help to lower your risk for infection. You may be given CHG to use for bathing. It may be in a bottle or in a prepackaged cloth to use on your skin. Carefully follow your health care provider's instructions and the instructions on the product label. Do not use CHG if you have a chlorhexidine allergy. Contact your health care provider if your skin gets irritated after scrubbing. This information is not intended to replace advice given to you by your health care provider. Make sure you discuss any questions you have with your healthcare provider. Document Revised: 02/05/2020 Document Reviewed: 03/11/2020 Elsevier Patient Education  [place].

## 2021-05-03 ENCOUNTER — Other Ambulatory Visit: Payer: Self-pay

## 2021-05-03 ENCOUNTER — Encounter (HOSPITAL_COMMUNITY)
Admission: RE | Admit: 2021-05-03 | Discharge: 2021-05-03 | Disposition: A | Discharge status: Home / Self Care | Payer: PPO | Source: Ambulatory Visit | Attending: General Surgery | Admitting: General Surgery

## 2021-05-03 DIAGNOSIS — F1120 Opioid dependence, uncomplicated: Secondary | ICD-10-CM | POA: Diagnosis not present

## 2021-05-03 DIAGNOSIS — G4486 Cervicogenic headache: Secondary | ICD-10-CM | POA: Diagnosis not present

## 2021-05-03 DIAGNOSIS — Z981 Arthrodesis status: Secondary | ICD-10-CM | POA: Diagnosis not present

## 2021-05-03 DIAGNOSIS — M961 Postlaminectomy syndrome, not elsewhere classified: Secondary | ICD-10-CM | POA: Diagnosis not present

## 2021-05-05 ENCOUNTER — Ambulatory Visit (HOSPITAL_COMMUNITY)
Admission: RE | Admit: 2021-05-05 | Discharge: 2021-05-05 | Disposition: A | Discharge status: Home / Self Care | Payer: PPO | Attending: General Surgery | Admitting: General Surgery

## 2021-05-05 ENCOUNTER — Encounter (HOSPITAL_COMMUNITY): Payer: Self-pay | Admitting: General Surgery

## 2021-05-05 ENCOUNTER — Ambulatory Visit (HOSPITAL_COMMUNITY): Payer: PPO | Admitting: Certified Registered"

## 2021-05-05 ENCOUNTER — Encounter (HOSPITAL_COMMUNITY)
Admission: RE | Disposition: A | Discharge status: Home / Self Care | Payer: Self-pay | Source: Home / Self Care | Attending: General Surgery

## 2021-05-05 DIAGNOSIS — K4091 Unilateral inguinal hernia, without obstruction or gangrene, recurrent: Secondary | ICD-10-CM

## 2021-05-05 DIAGNOSIS — Z79818 Long term (current) use of other agents affecting estrogen receptors and estrogen levels: Secondary | ICD-10-CM | POA: Insufficient documentation

## 2021-05-05 DIAGNOSIS — Z87891 Personal history of nicotine dependence: Secondary | ICD-10-CM | POA: Insufficient documentation

## 2021-05-05 DIAGNOSIS — Z89511 Acquired absence of right leg below knee: Secondary | ICD-10-CM | POA: Diagnosis not present

## 2021-05-05 DIAGNOSIS — Z885 Allergy status to narcotic agent status: Secondary | ICD-10-CM | POA: Insufficient documentation

## 2021-05-05 DIAGNOSIS — Z79899 Other long term (current) drug therapy: Secondary | ICD-10-CM | POA: Insufficient documentation

## 2021-05-05 DIAGNOSIS — Z881 Allergy status to other antibiotic agents status: Secondary | ICD-10-CM | POA: Diagnosis not present

## 2021-05-05 DIAGNOSIS — F418 Other specified anxiety disorders: Secondary | ICD-10-CM | POA: Diagnosis not present

## 2021-05-05 DIAGNOSIS — K4090 Unilateral inguinal hernia, without obstruction or gangrene, not specified as recurrent: Secondary | ICD-10-CM

## 2021-05-05 HISTORY — PX: INGUINAL HERNIA REPAIR: SHX194

## 2021-05-05 SURGERY — REPAIR, HERNIA, INGUINAL, ADULT
Anesthesia: General | Site: Inguinal | Laterality: Right

## 2021-05-05 MED ORDER — CEFAZOLIN SODIUM-DEXTROSE 2-4 GM/100ML-% IV SOLN
2.0000 g | INTRAVENOUS | Status: AC
  Administered 2021-05-05: 2 g via INTRAVENOUS

## 2021-05-05 MED ORDER — DEXAMETHASONE SODIUM PHOSPHATE 10 MG/ML IJ SOLN
INTRAMUSCULAR | Status: AC
  Filled 2021-05-05: qty 1

## 2021-05-05 MED ORDER — ONDANSETRON HCL 4 MG/2ML IJ SOLN
INTRAMUSCULAR | Status: DC | PRN
  Administered 2021-05-05: 4 mg via INTRAVENOUS

## 2021-05-05 MED ORDER — CHLORHEXIDINE GLUCONATE CLOTH 2 % EX PADS: 6.0000 | MEDICATED_PAD | Freq: Once | CUTANEOUS | Status: DC

## 2021-05-05 MED ORDER — BUPIVACAINE HCL (300 MG DOSE) 3 X 100 MG IL IMPL
DRUG_IMPLANT | Status: DC | PRN
  Administered 2021-05-05: 200 mg

## 2021-05-05 MED ORDER — ORAL CARE MOUTH RINSE: 15.0000 mL | Freq: Once | OROMUCOSAL | Status: AC

## 2021-05-05 MED ORDER — KETOROLAC TROMETHAMINE 30 MG/ML IJ SOLN
30.0000 mg | Freq: Once | INTRAMUSCULAR | Status: AC
  Administered 2021-05-05: 30 mg via INTRAVENOUS
  Filled 2021-05-05: qty 1

## 2021-05-05 MED ORDER — MIDAZOLAM HCL 2 MG/2ML IJ SOLN
INTRAMUSCULAR | Status: AC
  Filled 2021-05-05: qty 2

## 2021-05-05 MED ORDER — FENTANYL CITRATE (PF) 100 MCG/2ML IJ SOLN
25.0000 ug | INTRAMUSCULAR | Status: DC | PRN
  Administered 2021-05-05 (×2): 50 ug via INTRAVENOUS
  Filled 2021-05-05: qty 2

## 2021-05-05 MED ORDER — FENTANYL CITRATE (PF) 100 MCG/2ML IJ SOLN
INTRAMUSCULAR | Status: DC | PRN
  Administered 2021-05-05 (×2): 25 ug via INTRAVENOUS
  Administered 2021-05-05: 50 ug via INTRAVENOUS

## 2021-05-05 MED ORDER — MIDAZOLAM HCL 5 MG/5ML IJ SOLN
INTRAMUSCULAR | Status: DC | PRN
  Administered 2021-05-05: 2 mg via INTRAVENOUS

## 2021-05-05 MED ORDER — SODIUM CHLORIDE 0.9 % IR SOLN
Status: DC | PRN
  Administered 2021-05-05: 1000 mL

## 2021-05-05 MED ORDER — BUPIVACAINE LIPOSOME 1.3 % IJ SUSP: INTRAMUSCULAR | Status: DC | PRN

## 2021-05-05 MED ORDER — FENTANYL CITRATE (PF) 100 MCG/2ML IJ SOLN
INTRAMUSCULAR | Status: AC
  Filled 2021-05-05: qty 2

## 2021-05-05 MED ORDER — PROPOFOL 10 MG/ML IV BOLUS
INTRAVENOUS | Status: DC | PRN
  Administered 2021-05-05: 140 mg via INTRAVENOUS

## 2021-05-05 MED ORDER — PROPOFOL 10 MG/ML IV BOLUS
INTRAVENOUS | Status: AC
  Filled 2021-05-05: qty 20

## 2021-05-05 MED ORDER — LIDOCAINE HCL (PF) 2 % IJ SOLN
INTRAMUSCULAR | Status: AC
  Filled 2021-05-05: qty 5

## 2021-05-05 MED ORDER — CEFAZOLIN SODIUM-DEXTROSE 2-4 GM/100ML-% IV SOLN
INTRAVENOUS | Status: AC
  Filled 2021-05-05: qty 100

## 2021-05-05 MED ORDER — CHLORHEXIDINE GLUCONATE 0.12 % MT SOLN
15.0000 mL | Freq: Once | OROMUCOSAL | Status: AC
  Administered 2021-05-05: 15 mL via OROMUCOSAL

## 2021-05-05 MED ORDER — LIDOCAINE 2% (20 MG/ML) 5 ML SYRINGE
INTRAMUSCULAR | Status: DC | PRN
  Administered 2021-05-05: 60 mg via INTRAVENOUS

## 2021-05-05 MED ORDER — ONDANSETRON HCL 4 MG/2ML IJ SOLN: 4.0000 mg | Freq: Once | INTRAMUSCULAR | Status: DC | PRN

## 2021-05-05 MED ORDER — LACTATED RINGERS IV SOLN: INTRAVENOUS | Status: DC

## 2021-05-05 MED ORDER — CHLORHEXIDINE GLUCONATE 0.12 % MT SOLN
OROMUCOSAL | Status: AC
  Filled 2021-05-05: qty 15

## 2021-05-05 MED ORDER — DEXAMETHASONE SODIUM PHOSPHATE 10 MG/ML IJ SOLN
INTRAMUSCULAR | Status: DC | PRN
  Administered 2021-05-05: 5 mg via INTRAVENOUS

## 2021-05-05 SURGICAL SUPPLY — 38 items
ADH SKN CLS APL DERMABOND .7 (GAUZE/BANDAGES/DRESSINGS) ×1
CLOTH BEACON ORANGE TIMEOUT ST (SAFETY) ×2 IMPLANT
COVER LIGHT HANDLE STERIS (MISCELLANEOUS) ×4 IMPLANT
DERMABOND ADVANCED (GAUZE/BANDAGES/DRESSINGS) ×1
DERMABOND ADVANCED .7 DNX12 (GAUZE/BANDAGES/DRESSINGS) ×1 IMPLANT
DRAIN PENROSE 0.5X18 (DRAIN) ×2 IMPLANT
ELECT REM PT RETURN 9FT ADLT (ELECTROSURGICAL) ×2
ELECTRODE REM PT RTRN 9FT ADLT (ELECTROSURGICAL) ×1 IMPLANT
GAUZE SPONGE 4X4 12PLY STRL (GAUZE/BANDAGES/DRESSINGS) ×2 IMPLANT
GLOVE SURG ENC MOIS LTX SZ7 (GLOVE) ×2 IMPLANT
GLOVE SURG GAMMEX LF SZ6 (GLOVE) ×2 IMPLANT
GLOVE SURG GAMMEX LF SZ7 (GLOVE) ×2 IMPLANT
GLOVE SURG SS PI 7.5 STRL IVOR (GLOVE) ×2 IMPLANT
GLOVE SURG UNDER POLY LF SZ7 (GLOVE) ×6 IMPLANT
GOWN STRL REUS W/TWL LRG LVL3 (GOWN DISPOSABLE) ×6 IMPLANT
INST SET MINOR GENERAL (KITS) ×2 IMPLANT
KIT TURNOVER KIT A (KITS) ×2 IMPLANT
MANIFOLD NEPTUNE II (INSTRUMENTS) ×2 IMPLANT
MESH MARLEX PLUG MEDIUM (Mesh General) ×2 IMPLANT
NEEDLE HYPO 18GX1.5 BLUNT FILL (NEEDLE) ×2 IMPLANT
NEEDLE HYPO 21X1.5 SAFETY (NEEDLE) ×2 IMPLANT
NS IRRIG 1000ML POUR BTL (IV SOLUTION) ×2 IMPLANT
PACK MINOR (CUSTOM PROCEDURE TRAY) ×2 IMPLANT
PAD ARMBOARD 7.5X6 YLW CONV (MISCELLANEOUS) ×2 IMPLANT
PENCIL SMOKE EVACUATOR (MISCELLANEOUS) ×2 IMPLANT
SET BASIN LINEN APH (SET/KITS/TRAYS/PACK) ×2 IMPLANT
SOL PREP PROV IODINE SCRUB 4OZ (MISCELLANEOUS) ×2 IMPLANT
SUT MNCRL AB 4-0 PS2 18 (SUTURE) ×2 IMPLANT
SUT NOVA NAB GS-22 2 2-0 T-19 (SUTURE) ×4 IMPLANT
SUT SILK 3 0 (SUTURE)
SUT SILK 3-0 18XBRD TIE 12 (SUTURE) IMPLANT
SUT VIC AB 2-0 CT1 27 (SUTURE) ×2
SUT VIC AB 2-0 CT1 TAPERPNT 27 (SUTURE) ×1 IMPLANT
SUT VIC AB 3-0 SH 27 (SUTURE) ×2
SUT VIC AB 3-0 SH 27X BRD (SUTURE) ×1 IMPLANT
SUT VICRYL AB 2 0 TIES (SUTURE) IMPLANT
SUT VICRYL AB 3 0 TIES (SUTURE) IMPLANT
SYR 20ML LL LF (SYRINGE) ×4 IMPLANT

## 2021-05-05 NOTE — Anesthesia Postprocedure Evaluation (Signed)
Anesthesia Post Note  Patient: Bradley Espinoza  Procedure(s) Performed: HERNIA REPAIR INGUINAL ADULT (Right)  Patient location during evaluation: Phase II Anesthesia Type: General Level of consciousness: awake Pain management: pain level controlled Vital Signs Assessment: post-procedure vital signs reviewed and stable Respiratory status: spontaneous breathing and respiratory function stable Cardiovascular status: blood pressure returned to baseline and stable Postop Assessment: no headache and no apparent nausea or vomiting Anesthetic complications: no Comments: Late entry   No notable events documented.   Last Vitals:  Vitals:   05/05/21 1100 05/05/21 1115  BP: (!) 185/91 (!) 175/91  Pulse: 68 74  Resp: 16 18  Temp:    SpO2: 98% 98%    Last Pain:  Vitals:   05/05/21 1123  PainSc: [place]

## 2021-05-05 NOTE — Interval H&P Note (Signed)
History and Physical Interval Note:  05/05/2021 9:36 AM  Bradley Espinoza  has presented today for surgery, with the diagnosis of Right inguinal hernia.  The various methods of treatment have been discussed with the patient and family. After consideration of risks, benefits and other options for treatment, the patient has consented to  Procedure(s): HERNIA REPAIR INGUINAL ADULT (Right) as a surgical intervention.  The patient's history has been reviewed, patient examined, no change in status, stable for surgery.  I have reviewed the patient's chart and labs.  Questions were answered to the patient's satisfaction.     Aviva Signs

## 2021-05-05 NOTE — Transfer of Care (Signed)
Immediate Anesthesia Transfer of Care Note  Patient: MIDDLETON DAQUINO   Procedure(s) Performed: HERNIA REPAIR INGUINAL ADULT (Right)  Patient Location: PACU  Anesthesia Type:General  Level of Consciousness: sedated, patient cooperative and responds to stimulation  Airway & Oxygen Therapy: Patient Spontanous Breathing and Patient connected to face mask oxygen  Post-op Assessment: Report given to RN, Post -op Vital signs reviewed and stable and Patient moving all extremities  Post vital signs: Reviewed and stable  Last Vitals:  Vitals Value Taken Time  BP 171/79 05/05/21 1045  Temp    Pulse 65 05/05/21 1046  Resp 14 05/05/21 1046  SpO2 100 % 05/05/21 1046  Vitals shown include unvalidated device data.  Last Pain:  Vitals:   05/05/21 0909  PainSc: 9          Complications: No notable events documented.

## 2021-05-05 NOTE — Anesthesia Procedure Notes (Signed)
Procedure Name: LMA Insertion Date/Time: 05/05/2021 9:59 AM Performed by: Myna Bright, CRNA Pre-anesthesia Checklist: Patient identified, Emergency Drugs available, Suction available and Patient being monitored Patient Re-evaluated:Patient Re-evaluated prior to induction Oxygen Delivery Method: Circle system utilized Preoxygenation: Pre-oxygenation with 100% oxygen Induction Type: IV induction Ventilation: Mask ventilation without difficulty LMA: LMA inserted LMA Size: 4.0 Tube type: Oral Number of attempts: 1 Placement Confirmation: positive ETCO2 and breath sounds checked- equal and bilateral Tube secured with: Tape Dental Injury: Teeth and Oropharynx as per pre-operative assessment

## 2021-05-05 NOTE — Anesthesia Preprocedure Evaluation (Signed)
Anesthesia Evaluation  Patient identified by MRN, date of birth, ID band Patient awake    Reviewed: Allergy & Precautions, H&P , NPO status , Patient's Chart, lab work & pertinent test results, reviewed documented beta blocker date and time   Airway Mallampati: II  TM Distance: >3 FB Neck ROM: full    Dental no notable dental hx. (+) Partial Upper   Pulmonary neg pulmonary ROS, Patient abstained from smoking., former smoker,    Pulmonary exam normal breath sounds clear to auscultation       Cardiovascular Exercise Tolerance: Good negative cardio ROS   Rhythm:regular Rate:Normal     Neuro/Psych  Headaches, PSYCHIATRIC DISORDERS Anxiety Depression    GI/Hepatic Neg liver ROS, GERD  Medicated,  Endo/Other  negative endocrine ROS  Renal/GU negative Renal ROS  negative genitourinary   Musculoskeletal   Abdominal   Peds  Hematology  (+) Blood dyscrasia, anemia ,   Anesthesia Other Findings   Reproductive/Obstetrics negative OB ROS                             Anesthesia Physical Anesthesia Plan  ASA: 3  Anesthesia Plan: General   Post-op Pain Management:    Induction:   PONV Risk Score and Plan:   Airway Management Planned:   Additional Equipment:   Intra-op Plan:   Post-operative Plan:   Informed Consent: I have reviewed the patients History and Physical, chart, labs and discussed the procedure including the risks, benefits and alternatives for the proposed anesthesia with the patient or authorized representative who has indicated his/her understanding and acceptance.     Dental Advisory Given  Plan Discussed with: CRNA  Anesthesia Plan Comments:         Anesthesia Quick Evaluation

## 2021-05-05 NOTE — Op Note (Signed)
Patient:  Bradley Espinoza  DOB:  05-09-1956  MRN:  [id number]   Preop Diagnosis: Right inguinal hernia, recurrent  Postop Diagnosis: Same  Procedure: Recurrent right inguinal herniorrhaphy with mesh  Surgeon: Aviva Signs, MD  Anes: General  Indications: Patient is a 65 year old white male with multiple medical problems, status post bilateral inguinal herniorrhaphy in the remote past who presents with a right inguinal hernia.  The risks and benefits of the procedure including bleeding, infection, mesh use, and the possibility of recurrence of the hernia were fully explained to the patient, who gave informed consent.  Procedure note: The patient was placed in the supine position.  After general anesthesia was administered, the right groin region was prepped and draped using the usual sterile technique with Betadine.  Surgical site confirmation was performed.  Incision was made through the previous surgical incision site in the right groin region down to the external oblique aponeurosis.  The aponeurosis was incised to the external ring.  The ilioinguinal nerve was identified and kept free from the operative field.  There was no mesh found in this area.  An indirect hernia sac was found.  This was freed away from the spermatic cord and inverted at the peritoneal reflection.  A medium size Bard PerFix plug was then placed.  An onlay patch was placed along the floor of the inguinal canal and secured superiorly to the conjoined tendon and inferiorly to the shelving edge of Poupart's ligament using 2-0 Novafil interrupted sutures.  The internal ring was recreated using a 2-0 Novafil interrupted suture.  Xaracoll strips were placed over the mesh and in the subcutaneous layer.  The external Bleich aponeurosis was reapproximated using a 2-0 Vicryl running suture.  Subcutaneous layer was reapproximated using 3-0 Vicryl interrupted sutures.  The skin was closed using a 4-0 Monocryl subcuticular suture.   Dermabond was applied.  All tape and needle counts were correct at the end of the procedure.  The patient was awakened and transferred to PACU in stable condition.  Complications: None  EBL: Minimal  Specimen: None

## 2021-05-08 ENCOUNTER — Encounter (HOSPITAL_COMMUNITY): Payer: Self-pay | Admitting: General Surgery

## 2021-05-08 ENCOUNTER — Encounter (HOSPITAL_COMMUNITY): Payer: PPO | Admitting: Physical Therapy

## 2021-05-10 ENCOUNTER — Encounter (HOSPITAL_COMMUNITY): Payer: PPO | Admitting: Physical Therapy

## 2021-05-10 DIAGNOSIS — F410 Panic disorder [episodic paroxysmal anxiety] without agoraphobia: Secondary | ICD-10-CM | POA: Diagnosis not present

## 2021-05-15 ENCOUNTER — Encounter (HOSPITAL_COMMUNITY): Payer: PPO | Admitting: Physical Therapy

## 2021-05-15 ENCOUNTER — Telehealth (INDEPENDENT_AMBULATORY_CARE_PROVIDER_SITE_OTHER): Payer: PPO | Admitting: General Surgery

## 2021-05-15 DIAGNOSIS — Z09 Encounter for follow-up examination after completed treatment for conditions other than malignant neoplasm: Secondary | ICD-10-CM

## 2021-05-15 NOTE — Telephone Encounter (Signed)
Telephone postoperative visit performed with the patient.  He states he is doing well.  He does have a healing ridge below the incision and I told him this should flatten out with time.  He states his preoperative pain has resolved.  I told him to return in one month if the knot persist.  May increase activity as able.  As this was a part of the global surgical fee, this was not a billable visit.  Total telephone time was 2 minutes.

## 2021-05-19 ENCOUNTER — Encounter (HOSPITAL_COMMUNITY): Payer: PPO | Admitting: Physical Therapy

## 2021-05-20 ENCOUNTER — Other Ambulatory Visit (INDEPENDENT_AMBULATORY_CARE_PROVIDER_SITE_OTHER): Payer: Self-pay | Admitting: Gastroenterology

## 2021-05-29 ENCOUNTER — Telehealth (INDEPENDENT_AMBULATORY_CARE_PROVIDER_SITE_OTHER): Payer: Self-pay

## 2021-05-29 ENCOUNTER — Encounter (HOSPITAL_COMMUNITY): Payer: PPO | Admitting: Physical Therapy

## 2021-05-29 ENCOUNTER — Other Ambulatory Visit (INDEPENDENT_AMBULATORY_CARE_PROVIDER_SITE_OTHER): Payer: Self-pay | Admitting: Gastroenterology

## 2021-05-29 DIAGNOSIS — K219 Gastro-esophageal reflux disease without esophagitis: Secondary | ICD-10-CM

## 2021-05-29 MED ORDER — OMEPRAZOLE 40 MG PO CPDR: 40.0000 mg | DELAYED_RELEASE_CAPSULE | Freq: Every day | ORAL | 3 refills | Status: AC

## 2021-05-29 NOTE — Telephone Encounter (Signed)
PPI use was not discussed during previous visits. Will refill omeprazole  Thanks

## 2021-05-29 NOTE — Telephone Encounter (Signed)
Patient called today stating he had been on omeprazole 40 mg once per day in the past,but at last visit here he was not having any issues with GERD and states he was told he could stop the omeprazole which he did. Now he has been having some heartburn issues and would like for [place] to send in the omeprazole 40 mg once per day to [facility] in [place]. Please advise.

## 2021-05-29 NOTE — Telephone Encounter (Signed)
Patient aware.

## 2021-05-30 DIAGNOSIS — Z89511 Acquired absence of right leg below knee: Secondary | ICD-10-CM | POA: Diagnosis not present

## 2021-05-31 ENCOUNTER — Encounter (HOSPITAL_COMMUNITY): Payer: PPO | Admitting: Physical Therapy

## 2021-06-05 ENCOUNTER — Encounter (HOSPITAL_COMMUNITY): Payer: PPO | Admitting: Physical Therapy

## 2021-06-07 ENCOUNTER — Encounter (HOSPITAL_COMMUNITY): Payer: PPO | Admitting: Physical Therapy

## 2021-06-29 DIAGNOSIS — E291 Testicular hypofunction: Secondary | ICD-10-CM | POA: Diagnosis not present

## 2021-06-29 DIAGNOSIS — R7301 Impaired fasting glucose: Secondary | ICD-10-CM | POA: Diagnosis not present

## 2021-06-29 DIAGNOSIS — E782 Mixed hyperlipidemia: Secondary | ICD-10-CM | POA: Diagnosis not present

## 2021-07-05 DIAGNOSIS — E785 Hyperlipidemia, unspecified: Secondary | ICD-10-CM | POA: Diagnosis not present

## 2021-07-05 DIAGNOSIS — Z89511 Acquired absence of right leg below knee: Secondary | ICD-10-CM | POA: Diagnosis not present

## 2021-07-05 DIAGNOSIS — N400 Enlarged prostate without lower urinary tract symptoms: Secondary | ICD-10-CM | POA: Diagnosis not present

## 2021-07-05 DIAGNOSIS — F5221 Male erectile disorder: Secondary | ICD-10-CM | POA: Diagnosis not present

## 2021-07-05 DIAGNOSIS — G4700 Insomnia, unspecified: Secondary | ICD-10-CM | POA: Diagnosis not present

## 2021-07-05 DIAGNOSIS — K219 Gastro-esophageal reflux disease without esophagitis: Secondary | ICD-10-CM | POA: Diagnosis not present

## 2021-07-05 DIAGNOSIS — Z23 Encounter for immunization: Secondary | ICD-10-CM | POA: Diagnosis not present

## 2021-07-05 DIAGNOSIS — R5383 Other fatigue: Secondary | ICD-10-CM | POA: Diagnosis not present

## 2021-07-05 DIAGNOSIS — G894 Chronic pain syndrome: Secondary | ICD-10-CM | POA: Diagnosis not present

## 2021-07-05 DIAGNOSIS — K5900 Constipation, unspecified: Secondary | ICD-10-CM | POA: Diagnosis not present

## 2021-07-05 DIAGNOSIS — E291 Testicular hypofunction: Secondary | ICD-10-CM | POA: Diagnosis not present

## 2021-07-12 ENCOUNTER — Encounter (HOSPITAL_COMMUNITY): Payer: Self-pay

## 2021-07-12 ENCOUNTER — Emergency Department (HOSPITAL_COMMUNITY): Payer: PPO

## 2021-07-12 ENCOUNTER — Other Ambulatory Visit: Payer: Self-pay

## 2021-07-12 ENCOUNTER — Emergency Department (HOSPITAL_COMMUNITY)
Admission: EM | Admit: 2021-07-12 | Discharge: 2021-07-12 | Disposition: A | Discharge status: Home / Self Care | Payer: PPO | Attending: Emergency Medicine | Admitting: Emergency Medicine

## 2021-07-12 DIAGNOSIS — S50812A Abrasion of left forearm, initial encounter: Secondary | ICD-10-CM

## 2021-07-12 DIAGNOSIS — S51012A Laceration without foreign body of left elbow, initial encounter: Secondary | ICD-10-CM | POA: Diagnosis not present

## 2021-07-12 DIAGNOSIS — W1830XA Fall on same level, unspecified, initial encounter: Secondary | ICD-10-CM | POA: Diagnosis not present

## 2021-07-12 DIAGNOSIS — Z87891 Personal history of nicotine dependence: Secondary | ICD-10-CM | POA: Insufficient documentation

## 2021-07-12 DIAGNOSIS — M25532 Pain in left wrist: Secondary | ICD-10-CM | POA: Insufficient documentation

## 2021-07-12 DIAGNOSIS — S60812A Abrasion of left wrist, initial encounter: Secondary | ICD-10-CM

## 2021-07-12 DIAGNOSIS — S63502A Unspecified sprain of left wrist, initial encounter: Secondary | ICD-10-CM | POA: Diagnosis not present

## 2021-07-12 DIAGNOSIS — S63592A Other specified sprain of left wrist, initial encounter: Secondary | ICD-10-CM | POA: Diagnosis not present

## 2021-07-12 DIAGNOSIS — Z23 Encounter for immunization: Secondary | ICD-10-CM | POA: Insufficient documentation

## 2021-07-12 DIAGNOSIS — Y9389 Activity, other specified: Secondary | ICD-10-CM | POA: Insufficient documentation

## 2021-07-12 DIAGNOSIS — S59902A Unspecified injury of left elbow, initial encounter: Secondary | ICD-10-CM | POA: Diagnosis present

## 2021-07-12 DIAGNOSIS — M7989 Other specified soft tissue disorders: Secondary | ICD-10-CM | POA: Diagnosis not present

## 2021-07-12 DIAGNOSIS — M79642 Pain in left hand: Secondary | ICD-10-CM | POA: Diagnosis not present

## 2021-07-12 DIAGNOSIS — L03114 Cellulitis of left upper limb: Secondary | ICD-10-CM

## 2021-07-12 MED ORDER — HYDROCODONE-ACETAMINOPHEN 5-325 MG PO TABS
1.0000 | ORAL_TABLET | Freq: Once | ORAL | Status: AC
  Administered 2021-07-12: 1 via ORAL
  Filled 2021-07-12: qty 1

## 2021-07-12 MED ORDER — TETANUS-DIPHTH-ACELL PERTUSSIS 5-2.5-18.5 LF-MCG/0.5 IM SUSY
0.5000 mL | PREFILLED_SYRINGE | Freq: Once | INTRAMUSCULAR | Status: AC
  Administered 2021-07-12: 0.5 mL via INTRAMUSCULAR
  Filled 2021-07-12: qty 0.5

## 2021-07-12 MED ORDER — ACETAMINOPHEN 500 MG PO TABS
1000.0000 mg | ORAL_TABLET | Freq: Once | ORAL | Status: AC
  Administered 2021-07-12: 1000 mg via ORAL
  Filled 2021-07-12: qty 2

## 2021-07-12 MED ORDER — CEPHALEXIN 500 MG PO CAPS
1000.0000 mg | ORAL_CAPSULE | Freq: Once | ORAL | Status: AC
  Administered 2021-07-12: 1000 mg via ORAL
  Filled 2021-07-12: qty 2

## 2021-07-12 MED ORDER — MELOXICAM 7.5 MG PO TABS: 7.5000 mg | ORAL_TABLET | Freq: Every day | ORAL | 0 refills | Status: AC

## 2021-07-12 MED ORDER — CEPHALEXIN 500 MG PO CAPS: 500.0000 mg | ORAL_CAPSULE | Freq: Four times a day (QID) | ORAL | 0 refills | Status: AC

## 2021-07-12 MED ORDER — IBUPROFEN 400 MG PO TABS
400.0000 mg | ORAL_TABLET | Freq: Once | ORAL | Status: AC
  Administered 2021-07-12: 400 mg via ORAL
  Filled 2021-07-12: qty 1

## 2021-07-12 MED ORDER — KETOROLAC TROMETHAMINE 30 MG/ML IJ SOLN: 30.0000 mg | Freq: Once | INTRAMUSCULAR | Status: DC

## 2021-07-12 MED ORDER — ONDANSETRON 8 MG PO TBDP
8.0000 mg | ORAL_TABLET | Freq: Once | ORAL | Status: AC
  Administered 2021-07-12: 8 mg via ORAL
  Filled 2021-07-12: qty 1

## 2021-07-12 NOTE — ED Triage Notes (Signed)
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  C/O pain and swelling to left wrist and left hand.  Swelling noted.  Radial pulse present, capillary refill wnl.

## 2021-07-12 NOTE — ED Provider Notes (Signed)
[facility] EMERGENCY DEPARTMENT Provider Note   CSN: [id number] Arrival date & time: 07/12/21  0347     History Chief Complaint  Patient presents with  . Wrist Pain    Bradley Espinoza is a 65 y.o. male.   Wrist Pain  Patient presents with left wrist pain.  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  The pain is sharp, aggravated by movement.  It is primarily localized to the dorsal aspect of the wrist and proximal part of the left hand.  There is some localized swelling, no open wound.  Denies any numbness or tingling in the extremities, no pain elsewhere.  Reports that he has tried Percocet which he typically takes for his back pain, that has not improved the pain in his wrist at all.  Past Medical History:  Diagnosis Date  . Anxiety   . Arthritis    low back pain, lumbar radiculopathy  . Depression   . Fatty liver   . Fracture    B/L ankles  . GERD (gastroesophageal reflux disease)   . Headache(784.0)    allergy related   . History of kidney stones    passed stones  . History of stomach ulcers   . Retained orthopedic hardware    failed retained hardware right foot  . Wears glasses   . Wears glasses   . Wears partial dentures    upper    Patient Active Problem List   Diagnosis Date Noted  . Right inguinal hernia   . Recurrent right inguinal hernia   . Painful orthopaedic hardware ([place]) 01/09/2021  . Abdominal pain, left lower quadrant 12/15/2020  . Loss of weight 12/15/2020  . Chronic, continuous use of opioids 12/15/2020  . Acute hematogenous osteomyelitis of right foot ([place]) 01/29/2020  . Post-traumatic arthritis of ankle, right   . Cutaneous abscess of right ankle   . Subacute osteomyelitis of right tibia ([place])   . Fatty liver 06/30/2019  . Calculus of gallbladder without cholecystitis without obstruction 06/30/2019  . Nausea without vomiting 05/13/2019  . Abdominal  bloating 05/13/2019  . Wound infection following procedure 12/19/2018  . Malunion of joint fusion ([place]) 11/26/2018  . 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[facility])   . Status post lumbar spinal fusion 09/10/2018  . S/P ankle fusion 11/27/2017  . Nonunion of subtalar arthrodesis   . Avascular necrosis of talus, right ([place])   . Post-traumatic osteoarthritis, left ankle and foot 10/22/2017  . DDD (degenerative disc disease), lumbar 04/05/2017  . Fracture of L2 vertebra ([place]) 01/12/2015  . Acute osteomyelitis of calcaneum ([place]) 10/09/2014  . Dysuria 10/05/2014  . Cellulitis 10/05/2014  . Fall from ladder 09/21/2014  . L2 vertebral fracture ([place]) 09/21/2014  . Bilateral calcaneal fractures 09/21/2014  . Chronic pain 09/21/2014  . Acute blood loss anemia 09/21/2014  . Open fracture of both calcanei 09/15/2014    Past Surgical History:  Procedure Laterality Date  . AMPUTATION Right 01/29/2020   Procedure: RIGHT BELOW KNEE AMPUTATION;  Surgeon: Newt Minion, MD;  Location: [place];  Service: Orthopedics;  Laterality: Right;  . ANKLE FUSION Right 05/11/2015   Procedure: Right Posterior Arthroscopic Subtalar Arthrodesis;  Surgeon: Newt Minion, MD;  Location: [place];  Service: Orthopedics;  Laterality: Right;  . ANKLE FUSION Right 11/27/2017   Procedure: RIGHT TIBIOCALCANEAL FUSION;  Surgeon: Newt Minion, MD;  Location: [place];  Service: Orthopedics;  Laterality: Right;  .  ANKLE FUSION Right 11/26/2018   Procedure: RIGHT ANTERIOR ANKLE FUSION, APPLY VAC;  Surgeon: Newt Minion, MD;  Location: [place];  Service: Orthopedics;  Laterality: Right;  . ANKLE FUSION Right 09/09/2019   Procedure: REVISION RIGHT ANKLE FUSION;  Surgeon: Newt Minion, MD;  Location: [place];  Service: Orthopedics;  Laterality: Right;  . ANTERIOR LAT LUMBAR FUSION N/A 04/05/2017   Procedure: Extreme Lateral Interbody Fusion - Lumbar three-lumbar four ,exploration of fusion Posterior augmentation with globus addition Removal hardware Lumbar  one-three. Lumbar four-sacral one,  Removal internal bone growth stimulator;  Surgeon: Kary Kos, MD;  Location: [place];  Service: Neurosurgery;  Laterality: N/A;  . APPLICATION OF WOUND VAC  12/23/2018   Procedure: Application Of Wound Vac;  Surgeon: Newt Minion, MD;  Location: [place];  Service: Orthopedics;;  . BACK SURGERY  2004   x 2  . BIOPSY  06/05/2019   Procedure: BIOPSY;  Surgeon: Rogene Houston, MD;  [facility];  Service: Endoscopy;;  gastric biopsy  . CERVICAL SPINE SURGERY  2008  . CHOLECYSTECTOMY N/A 07/06/2019   Procedure: LAPAROSCOPIC CHOLECYSTECTOMY;  Surgeon: Virl Cagey, MD;  Location: [facility];  Service: General;  Laterality: N/A;  . COLONOSCOPY WITH PROPOFOL N/A 06/05/2019   Procedure: COLONOSCOPY WITH PROPOFOL;  Surgeon: Rogene Houston, MD;  [facility];  Service: Endoscopy;  Laterality: N/A;  . ESOPHAGOGASTRODUODENOSCOPY    . ESOPHAGOGASTRODUODENOSCOPY (EGD) WITH PROPOFOL N/A 06/05/2019   Procedure: ESOPHAGOGASTRODUODENOSCOPY (EGD) WITH PROPOFOL;  Surgeon: Rogene Houston, MD;  [facility];  Service: Endoscopy;  Laterality: N/A;  1220pm  . HARDWARE REMOVAL Right 10/09/2014   Procedure: Removal Deep Hardware, Irrigation and Debridement Calcaneus, Place Antibiotic Beads and Wound VAC ;  Surgeon: Newt Minion, MD;  Location: [place];  Service: Orthopedics;  Laterality: Right;  . HARDWARE REMOVAL Right 08/12/2015   Procedure: Removal Deep Hardware Right Foot;  Surgeon: Newt Minion, MD;  Location: [place];  Service: Orthopedics;  Laterality: Right;  . HARDWARE REMOVAL Right 11/26/2018   Procedure: REMOVAL RIGHT TIBIOCALCANEAL NAIL;  Surgeon: Newt Minion, MD;  Location: [place];  Service: Orthopedics;  Laterality: Right;  . HARDWARE REMOVAL Right 12/23/2018   Procedure: RIGHT ANKLE REMOVE HARDWARE, PLACE ANTIBIOTIC BEADS and placement of wound vac;  Surgeon: Newt Minion, MD;  Location: [place];  Service: Orthopedics;  Laterality: Right;   . HARDWARE REMOVAL Right 09/29/2019   Procedure: RIGHT ANKLE REMOVE HARDWARE, DEBRIDEMENT;  Surgeon: Newt Minion, MD;  Location: [place];  Service: Orthopedics;  Laterality: Right;  . HARDWARE REMOVAL N/A 01/09/2021   Procedure: Removal of hardware upper thoracic screws;  Surgeon: Kary Kos, MD;  Location: [place];  Service: Neurosurgery;  Laterality: N/A;  . I & D EXTREMITY Right 09/15/2014   Procedure: IRRIGATION AND DEBRIDEMENT Ankle;  Surgeon: Renette Butters, MD;  Location: [place];  Service: Orthopedics;  Laterality: Right;  . INGUINAL HERNIA REPAIR Bilateral   . INGUINAL HERNIA REPAIR Right 05/05/2021   Procedure: HERNIA REPAIR INGUINAL ADULT WITH MESH;  Surgeon: Aviva Signs, MD;  Location: [facility];  Service: General;  Laterality: Right;  . LAMINECTOMY WITH POSTERIOR LATERAL ARTHRODESIS LEVEL 4 N/A 09/10/2018   Procedure: Posterior Lateral Fusion - Thoracic Eleven-Thoracic Twelve - Thoracic Twelve-Lumbar One - Lumbar One-Lumbar Two - Lumbar Two-Lumbar Three with instrumentaion and PLA;  Surgeon: Kary Kos, MD;  Location: [place];  Service: Neurosurgery;  Laterality: N/A;  Posterior Lateral  Fusion - Thoracic Eleven-Thoracic Twelve - Thoracic Twelve-Lumbar One - Lumbar One-Lumbar Two - Lumbar Two-Lumbar Thre  . LIVER BIOPSY N/A 07/06/2019   Procedure: LIVER BIOPSY;  Surgeon: Virl Cagey, MD;  [facility]: [facility];  Service: General;  Laterality: N/A;  . LUMBAR PERCUTANEOUS PEDICLE SCREW 1 LEVEL N/A 04/05/2017   Procedure: LUMBAR PERCUTANEOUS PEDICLE SCREW LUMBAR THREE-FOUR;  Surgeon: Kary Kos, MD;  Location: [place];  Service: Neurosurgery;  Laterality: N/A;  . ORIF CALCANEOUS FRACTURE Right 09/19/2014   Procedure: OPEN REDUCTION INTERNAL FIXATION (ORIF) CALCANEOUS FRACTURE;  Surgeon: Newt Minion, MD;  Location: [place];  Service: Orthopedics;  Laterality: Right;  . ORIF CALCANEOUS FRACTURE Left 09/19/2014   Procedure: OPEN REDUCTION INTERNAL FIXATION (ORIF) CALCANEOUS FRACTURE;  Surgeon: Newt Minion, MD;  Location: [place];  Service: Orthopedics;  Laterality: Left;  . POLYPECTOMY  06/05/2019   Procedure: POLYPECTOMY;  Surgeon: Rogene Houston, MD;  [facility];  Service: Endoscopy;;  cecal polyp biopsy forcep, ascending polyp cold snare       Family History  Problem Relation Age of Onset  . Diabetes Father   . Cancer Other     Social History   Tobacco Use  . Smoking status: Former    Packs/day: 1.00    Years: 10.00    Pack years: 10.00    Types: Cigars, Cigarettes    Start date: 10/08/1976    Quit date: 09/2017    Years since quitting: 3.8  . Smokeless tobacco: Never  . Tobacco comments:    quit smoking cegar 09/10/2018  Vaping Use  . Vaping Use: Never used  Substance Use Topics  . Alcohol use: No    Comment:  quit 1998  . Drug use: No    Home Medications Prior to Admission medications   Medication Sig Start Date End Date Taking? Authorizing Provider  albuterol (VENTOLIN HFA) 108 (90 Base) MCG/ACT inhaler Inhale 2 puffs into the lungs every 4 (four) hours as needed for wheezing or shortness of breath. 01/13/20   [provider]  docusate sodium (COLACE) 100 MG capsule Take 100 mg by mouth daily as needed for mild constipation.     [provider]  gabapentin (NEURONTIN) 300 MG capsule Take 300 mg by mouth 2 (two) times daily.    [provider]  ibuprofen (ADVIL) 200 MG tablet Take 400 mg by mouth every 6 (six) hours as needed for moderate pain or mild pain.    [provider]  megestrol (MEGACE) 40 MG tablet Take 1 tablet (40 mg total) by mouth daily. 03/20/21   Harvel Quale, MD  metoCLOPramide (REGLAN) 5 MG tablet Take 5 mg by mouth 4 (four) times daily -  before meals and at bedtime.    [provider]  omeprazole (PRILOSEC) 40 MG capsule Take 1 capsule (40 mg total) by mouth daily. 05/29/21   Harvel Quale, MD  ondansetron (ZOFRAN) 4 MG tablet TAKE (1) TABLET THREE TIMES DAILY AS  NEEDED FOR NAUSEA & VOMITING. 02/07/21   Montez Morita, Quillian Quince, MD  oxyCODONE-acetaminophen (PERCOCET) 10-325 MG tablet Take 1 tablet by mouth every 4 (four) hours as needed for pain. 02/01/20   Persons, Bevely Palmer, PA  promethazine (PHENERGAN) 25 MG tablet TAKE 1 TABLET BY MOUTH EVERY 8 HOURS AS NEEDED FOR NAUSEA & VOMITING. 05/22/21   Montez Morita, Quillian Quince, MD  tamsulosin (FLOMAX) 0.4 MG CAPS capsule Take 0.4 mg by mouth in the morning and at bedtime.  [provider]  testosterone cypionate (DEPOTESTOSTERONE CYPIONATE) 200 MG/ML injection Inject 1 mL (200 mg total) into the muscle every 14 (fourteen) days. 01/02/21   Noreene Larsson, NP    Allergies    Doxycycline, Levofloxacin, and Codeine  Review of Systems   Review of Systems  Constitutional:  Negative for fever.  Musculoskeletal:  Positive for arthralgias, joint swelling and myalgias.  Skin:  Negative for color change and wound.  Neurological:  Negative for numbness.   Physical Exam Updated Vital Signs BP (!) 144/97 (BP Location: Right Arm)   Pulse 83   Temp 98.8 F (37.1 C) (Temporal)   Resp 20   Ht 6' (1.829 m)   Wt 70.3 kg   SpO2 97%   BMI 21.02 kg/m   Physical Exam Vitals and nursing note reviewed. Exam conducted with a chaperone present.  Constitutional:      General: He is not in acute distress.    Appearance: Normal appearance.  HENT:     Head: Normocephalic and atraumatic.  Eyes:     General: No scleral icterus.    Extraocular Movements: Extraocular movements intact.     Pupils: Pupils are equal, round, and reactive to light.  Cardiovascular:     Comments: Radial pulse 2+ bilaterally Musculoskeletal:        General: Swelling and tenderness present.     Comments: Full range of motion sent to the digits of the left hand.  Decreased range of motion of left wrist due to pain.  There is slight swelling to the dorsal aspect of the left wrist, it is tender to touch.  Skin:    Capillary Refill:  Capillary refill takes less than 2 seconds.     Coloration: Skin is not jaundiced.     Findings: No bruising.     Comments: Small well-healing laceration to the antecubital space of the left elbow.   Neurological:     Mental Status: He is alert. Mental status is at baseline.     Sensory: No sensory deficit.     Coordination: Coordination normal.     Comments: Sensation to light touch in tact   ED Results / Procedures / Treatments   Labs (all labs ordered are listed, but only abnormal results are displayed) Labs Reviewed - No data to display  EKG None  Radiology No results found.  Procedures Procedures   Medications Ordered in ED Medications  HYDROcodone-acetaminophen (NORCO/VICODIN) 5-325 MG per tablet 1 tablet (has no administration in time range)  ondansetron (ZOFRAN-ODT) disintegrating tablet 8 mg (has no administration in time range)    ED Course  I have reviewed the triage vital signs and the nursing notes.  Pertinent labs & imaging results that were available during my care of the patient were reviewed by me and considered in my medical decision making (see chart for details).    MDM Rules/Calculators/A&P                           Patient presents with mechanical trauma to left wrist.  There is decreased range of motion secondary to pain, will assess for fracture with radiograph.  Patient has good cap refill, good sensation and strong radial pulse.  Doubt any vascular complications.  Due to the mechanism of injury, doubt any septic joint or gout.  Will treat pain and reassess after radiograph.  Evidence of dorsal soft tissue swelling, no acute fracture dislocation noted.  Patient denies any improvement  of pain with Norco [facility] Percocet earlier this morning.  Per chart review, patient does have a history of gastric ulcer.  Patient denies any of this history, takes he still takes Prilosec in the morning.  Discussed risks as well as benefits of anti-inflammatory  medicine, patient is amenable to continue taking his Prilosec.  He will take meloxicam twice daily with food and water for just 5 days.  He will follow-up with his primary care doctor next week for reevaluation.  Advised that if he has any bleeding, dark tarry stool, abdominal pain he needs to stop taking the medicine immediately.  He verbalized understanding.  Final Clinical Impression(s) / ED Diagnoses Final diagnoses:  None    Rx / DC Orders ED Discharge Orders     None        Sherrill Raring, PA-C 07/12/21 1718    Lajean Saver, MD 07/13/21 1356

## 2021-07-12 NOTE — Discharge Instructions (Addendum)
It was our pleasure to provide your ER care today - we hope that you feel better.  May wear splint for comfort/support for the next few days. Elevated area to help with swelling. Keep skin very clean - wash abrasion(s) with soap and water 2x/day. Take keflex (antibiotic) as prescribed. Take acetaminophen as need. You may also take the meloxicam as need - take with food.   Follow up with primary care doctor in one week if symptoms fail to improve/resolve.  Return to ER if worse, new symptoms, fevers, spreading redness, increased swelling, severe/intractable pain, numbness/weakness, or other concern.

## 2021-07-24 ENCOUNTER — Ambulatory Visit (INDEPENDENT_AMBULATORY_CARE_PROVIDER_SITE_OTHER): Payer: PPO | Admitting: Gastroenterology

## 2021-07-29 ENCOUNTER — Other Ambulatory Visit (INDEPENDENT_AMBULATORY_CARE_PROVIDER_SITE_OTHER): Payer: Self-pay | Admitting: Gastroenterology

## 2021-07-31 NOTE — Telephone Encounter (Signed)
08/22/202 last seen Bradley Espinoza

## 2021-08-24 DIAGNOSIS — M961 Postlaminectomy syndrome, not elsewhere classified: Secondary | ICD-10-CM | POA: Diagnosis not present

## 2021-08-24 DIAGNOSIS — Z981 Arthrodesis status: Secondary | ICD-10-CM | POA: Diagnosis not present

## 2021-08-24 DIAGNOSIS — F1120 Opioid dependence, uncomplicated: Secondary | ICD-10-CM | POA: Diagnosis not present

## 2021-09-15 DIAGNOSIS — S51011A Laceration without foreign body of right elbow, initial encounter: Secondary | ICD-10-CM | POA: Diagnosis not present

## 2021-09-15 DIAGNOSIS — W19XXXA Unspecified fall, initial encounter: Secondary | ICD-10-CM | POA: Diagnosis not present

## 2021-09-19 DIAGNOSIS — S51001A Unspecified open wound of right elbow, initial encounter: Secondary | ICD-10-CM | POA: Diagnosis not present

## 2021-09-20 ENCOUNTER — Other Ambulatory Visit (INDEPENDENT_AMBULATORY_CARE_PROVIDER_SITE_OTHER): Payer: Self-pay | Admitting: Gastroenterology

## 2021-09-20 NOTE — Telephone Encounter (Signed)
Please ask him to set up a follow up appointment, will refill the medicine today

## 2021-09-20 NOTE — Telephone Encounter (Signed)
Last office visit 03/20/21

## 2021-09-21 NOTE — Telephone Encounter (Signed)
Bradley Espinoza, please schedule follow up visit

## 2021-09-28 ENCOUNTER — Other Ambulatory Visit (INDEPENDENT_AMBULATORY_CARE_PROVIDER_SITE_OTHER): Payer: Self-pay

## 2021-09-28 DIAGNOSIS — R1032 Left lower quadrant pain: Secondary | ICD-10-CM

## 2021-09-28 DIAGNOSIS — K219 Gastro-esophageal reflux disease without esophagitis: Secondary | ICD-10-CM

## 2021-09-28 DIAGNOSIS — R110 Nausea: Secondary | ICD-10-CM

## 2021-09-28 DIAGNOSIS — R140 Abdominal distension (gaseous): Secondary | ICD-10-CM

## 2021-09-29 ENCOUNTER — Other Ambulatory Visit (INDEPENDENT_AMBULATORY_CARE_PROVIDER_SITE_OTHER): Payer: Self-pay | Admitting: Gastroenterology

## 2021-10-01 DIAGNOSIS — I499 Cardiac arrhythmia, unspecified: Secondary | ICD-10-CM | POA: Diagnosis not present

## 2021-10-01 DIAGNOSIS — I469 Cardiac arrest, cause unspecified: Secondary | ICD-10-CM | POA: Diagnosis not present

## 2021-10-01 DIAGNOSIS — R404 Transient alteration of awareness: Secondary | ICD-10-CM | POA: Diagnosis not present

## 2021-10-01 DIAGNOSIS — R0689 Other abnormalities of breathing: Secondary | ICD-10-CM | POA: Diagnosis not present

## 2021-10-03 NOTE — Telephone Encounter (Signed)
Last seen 03/20/2021

## 2021-10-08 DIAGNOSIS — 419620001 Death: Secondary | SNOMED CT | POA: Diagnosis not present

## 2021-10-08 DEATH — deceased

## 2021-11-09 ENCOUNTER — Ambulatory Visit (INDEPENDENT_AMBULATORY_CARE_PROVIDER_SITE_OTHER): Payer: PPO | Admitting: Gastroenterology
